# Patient Record
Sex: Female | Born: 1960
Health system: Southern US, Community
[De-identification: ages and names within clinical notes are randomized; demographics above are authoritative.]

## PROBLEM LIST (undated history)

## (undated) DIAGNOSIS — K5900 Constipation, unspecified: Secondary | ICD-10-CM

## (undated) DIAGNOSIS — R569 Unspecified convulsions: Secondary | ICD-10-CM

## (undated) DIAGNOSIS — D126 Benign neoplasm of colon, unspecified: Secondary | ICD-10-CM

## (undated) DIAGNOSIS — L989 Disorder of the skin and subcutaneous tissue, unspecified: Secondary | ICD-10-CM

## (undated) DIAGNOSIS — Z8619 Personal history of other infectious and parasitic diseases: Secondary | ICD-10-CM

## (undated) DIAGNOSIS — K219 Gastro-esophageal reflux disease without esophagitis: Secondary | ICD-10-CM

## (undated) DIAGNOSIS — F32A Depression, unspecified: Secondary | ICD-10-CM

## (undated) DIAGNOSIS — D649 Anemia, unspecified: Secondary | ICD-10-CM

## (undated) DIAGNOSIS — I10 Essential (primary) hypertension: Secondary | ICD-10-CM

## (undated) DIAGNOSIS — Z0000 Encounter for general adult medical examination without abnormal findings: Principal | ICD-10-CM

## (undated) DIAGNOSIS — F445 Conversion disorder with seizures or convulsions: Secondary | ICD-10-CM

## (undated) DIAGNOSIS — R739 Hyperglycemia, unspecified: Secondary | ICD-10-CM

## (undated) DIAGNOSIS — G43909 Migraine, unspecified, not intractable, without status migrainosus: Secondary | ICD-10-CM

## (undated) DIAGNOSIS — L309 Dermatitis, unspecified: Secondary | ICD-10-CM

## (undated) DIAGNOSIS — R319 Hematuria, unspecified: Secondary | ICD-10-CM

## (undated) DIAGNOSIS — F329 Major depressive disorder, single episode, unspecified: Secondary | ICD-10-CM

## (undated) DIAGNOSIS — E782 Mixed hyperlipidemia: Secondary | ICD-10-CM

## (undated) DIAGNOSIS — F419 Anxiety disorder, unspecified: Secondary | ICD-10-CM

## (undated) DIAGNOSIS — N921 Excessive and frequent menstruation with irregular cycle: Secondary | ICD-10-CM

## (undated) DIAGNOSIS — R1310 Dysphagia, unspecified: Secondary | ICD-10-CM

## (undated) HISTORY — DX: Anemia, unspecified: D64.9

## (undated) HISTORY — DX: Unspecified convulsions: R56.9

## (undated) HISTORY — DX: Constipation, unspecified: K59.00

## (undated) HISTORY — DX: Depression, unspecified: F32.A

## (undated) HISTORY — DX: Mixed hyperlipidemia: E78.2

## (undated) HISTORY — DX: Dysphagia, unspecified: R13.10

## (undated) HISTORY — DX: Excessive and frequent menstruation with irregular cycle: N92.1

## (undated) HISTORY — DX: Personal history of other infectious and parasitic diseases: Z86.19

## (undated) HISTORY — DX: Disorder of the skin and subcutaneous tissue, unspecified: L98.9

## (undated) HISTORY — DX: Hyperglycemia, unspecified: R73.9

## (undated) HISTORY — DX: Hematuria, unspecified: R31.9

## (undated) HISTORY — DX: Migraine, unspecified, not intractable, without status migrainosus: G43.909

## (undated) HISTORY — PX: ROTATOR CUFF REPAIR: SHX139

## (undated) HISTORY — DX: Encounter for general adult medical examination without abnormal findings: Z00.00

## (undated) HISTORY — DX: Major depressive disorder, single episode, unspecified: F32.9

## (undated) HISTORY — DX: Dermatitis, unspecified: L30.9

## (undated) HISTORY — PX: APPENDECTOMY: SHX54

## (undated) HISTORY — DX: Benign neoplasm of colon, unspecified: D12.6

---

## 1997-07-05 ENCOUNTER — Emergency Department (HOSPITAL_COMMUNITY): Admission: EM | Admit: 1997-07-05 | Discharge: 1997-07-05 | Payer: Self-pay | Admitting: Emergency Medicine

## 1997-09-22 ENCOUNTER — Emergency Department (HOSPITAL_COMMUNITY): Admission: EM | Admit: 1997-09-22 | Discharge: 1997-09-22 | Payer: Self-pay | Admitting: Emergency Medicine

## 1997-09-24 ENCOUNTER — Inpatient Hospital Stay: Admission: AD | Admit: 1997-09-24 | Discharge: 1997-09-25 | Payer: Self-pay | Admitting: Internal Medicine

## 1997-09-30 ENCOUNTER — Ambulatory Visit (HOSPITAL_COMMUNITY): Admission: RE | Admit: 1997-09-30 | Discharge: 1997-09-30 | Payer: Self-pay | Admitting: Internal Medicine

## 1997-10-05 ENCOUNTER — Inpatient Hospital Stay (HOSPITAL_COMMUNITY): Admission: AD | Admit: 1997-10-05 | Discharge: 1997-10-08 | Payer: Self-pay | Admitting: Internal Medicine

## 1997-10-20 ENCOUNTER — Encounter: Admission: RE | Admit: 1997-10-20 | Discharge: 1997-10-20 | Payer: Self-pay | Admitting: *Deleted

## 1998-01-06 ENCOUNTER — Encounter: Payer: Self-pay | Admitting: Emergency Medicine

## 1998-01-06 ENCOUNTER — Emergency Department (HOSPITAL_COMMUNITY): Admission: EM | Admit: 1998-01-06 | Discharge: 1998-01-06 | Payer: Self-pay | Admitting: Emergency Medicine

## 1998-01-21 ENCOUNTER — Ambulatory Visit (HOSPITAL_COMMUNITY): Admission: RE | Admit: 1998-01-21 | Discharge: 1998-01-21 | Payer: Self-pay | Admitting: Internal Medicine

## 1998-01-21 ENCOUNTER — Encounter: Payer: Self-pay | Admitting: Internal Medicine

## 1998-01-28 ENCOUNTER — Ambulatory Visit (HOSPITAL_COMMUNITY): Admission: RE | Admit: 1998-01-28 | Discharge: 1998-01-28 | Payer: Self-pay | Admitting: Internal Medicine

## 1998-01-28 ENCOUNTER — Encounter: Payer: Self-pay | Admitting: Internal Medicine

## 1998-03-07 ENCOUNTER — Emergency Department (HOSPITAL_COMMUNITY): Admission: EM | Admit: 1998-03-07 | Discharge: 1998-03-07 | Payer: Self-pay | Admitting: Emergency Medicine

## 1999-07-11 ENCOUNTER — Emergency Department (HOSPITAL_COMMUNITY): Admission: EM | Admit: 1999-07-11 | Discharge: 1999-07-11 | Payer: Self-pay | Admitting: Emergency Medicine

## 1999-10-14 ENCOUNTER — Emergency Department (HOSPITAL_COMMUNITY): Admission: EM | Admit: 1999-10-14 | Discharge: 1999-10-14 | Payer: Self-pay | Admitting: Emergency Medicine

## 1999-10-14 ENCOUNTER — Encounter: Payer: Self-pay | Admitting: Emergency Medicine

## 1999-12-25 ENCOUNTER — Emergency Department (HOSPITAL_COMMUNITY): Admission: EM | Admit: 1999-12-25 | Discharge: 1999-12-25 | Payer: Self-pay | Admitting: Emergency Medicine

## 1999-12-25 ENCOUNTER — Encounter: Payer: Self-pay | Admitting: Emergency Medicine

## 2001-01-12 ENCOUNTER — Encounter: Payer: Self-pay | Admitting: Orthopedic Surgery

## 2001-01-12 ENCOUNTER — Ambulatory Visit (HOSPITAL_COMMUNITY): Admission: RE | Admit: 2001-01-12 | Discharge: 2001-01-12 | Payer: Self-pay | Admitting: Orthopedic Surgery

## 2001-01-19 ENCOUNTER — Ambulatory Visit (HOSPITAL_COMMUNITY): Admission: RE | Admit: 2001-01-19 | Discharge: 2001-01-19 | Payer: Self-pay | Admitting: Orthopedic Surgery

## 2001-01-19 ENCOUNTER — Encounter: Payer: Self-pay | Admitting: Orthopedic Surgery

## 2001-02-03 ENCOUNTER — Other Ambulatory Visit: Admission: RE | Admit: 2001-02-03 | Discharge: 2001-02-03 | Payer: Self-pay | Admitting: Internal Medicine

## 2001-02-06 ENCOUNTER — Encounter: Admission: RE | Admit: 2001-02-06 | Discharge: 2001-02-06 | Payer: Self-pay | Admitting: Internal Medicine

## 2001-02-06 ENCOUNTER — Encounter: Payer: Self-pay | Admitting: Internal Medicine

## 2001-05-13 ENCOUNTER — Encounter: Payer: Self-pay | Admitting: Internal Medicine

## 2001-05-13 ENCOUNTER — Ambulatory Visit (HOSPITAL_COMMUNITY): Admission: RE | Admit: 2001-05-13 | Discharge: 2001-05-13 | Payer: Self-pay | Admitting: Internal Medicine

## 2001-05-14 ENCOUNTER — Encounter: Payer: Self-pay | Admitting: Internal Medicine

## 2001-05-14 ENCOUNTER — Inpatient Hospital Stay (HOSPITAL_COMMUNITY): Admission: AD | Admit: 2001-05-14 | Discharge: 2001-05-28 | Payer: Self-pay | Admitting: Internal Medicine

## 2001-05-14 ENCOUNTER — Ambulatory Visit (HOSPITAL_COMMUNITY): Admission: RE | Admit: 2001-05-14 | Discharge: 2001-05-14 | Payer: Self-pay | Admitting: Internal Medicine

## 2001-05-14 ENCOUNTER — Encounter (INDEPENDENT_AMBULATORY_CARE_PROVIDER_SITE_OTHER): Payer: Self-pay | Admitting: *Deleted

## 2001-05-20 ENCOUNTER — Encounter: Payer: Self-pay | Admitting: Internal Medicine

## 2001-05-22 ENCOUNTER — Encounter: Payer: Self-pay | Admitting: Internal Medicine

## 2001-05-23 ENCOUNTER — Encounter: Payer: Self-pay | Admitting: Internal Medicine

## 2001-05-27 ENCOUNTER — Encounter: Payer: Self-pay | Admitting: Internal Medicine

## 2001-06-04 ENCOUNTER — Observation Stay (HOSPITAL_COMMUNITY): Admission: EM | Admit: 2001-06-04 | Discharge: 2001-06-06 | Payer: Self-pay

## 2001-06-06 ENCOUNTER — Inpatient Hospital Stay (HOSPITAL_COMMUNITY): Admission: AD | Admit: 2001-06-06 | Discharge: 2001-06-09 | Payer: Self-pay | Admitting: Psychiatry

## 2001-06-12 ENCOUNTER — Encounter: Admission: RE | Admit: 2001-06-12 | Discharge: 2001-06-12 | Payer: Self-pay | Admitting: *Deleted

## 2001-07-02 ENCOUNTER — Encounter: Admission: RE | Admit: 2001-07-02 | Discharge: 2001-07-02 | Payer: Self-pay | Admitting: Psychiatry

## 2001-07-15 ENCOUNTER — Encounter: Admission: RE | Admit: 2001-07-15 | Discharge: 2001-07-15 | Payer: Self-pay | Admitting: *Deleted

## 2002-03-31 ENCOUNTER — Encounter: Payer: Self-pay | Admitting: Internal Medicine

## 2002-03-31 ENCOUNTER — Encounter: Admission: RE | Admit: 2002-03-31 | Discharge: 2002-03-31 | Payer: Self-pay | Admitting: Internal Medicine

## 2002-05-07 ENCOUNTER — Emergency Department (HOSPITAL_COMMUNITY): Admission: EM | Admit: 2002-05-07 | Discharge: 2002-05-08 | Payer: Self-pay | Admitting: Emergency Medicine

## 2002-08-03 ENCOUNTER — Emergency Department (HOSPITAL_COMMUNITY): Admission: EM | Admit: 2002-08-03 | Discharge: 2002-08-03 | Payer: Self-pay | Admitting: Emergency Medicine

## 2002-08-19 ENCOUNTER — Encounter: Admission: RE | Admit: 2002-08-19 | Discharge: 2002-08-19 | Payer: Self-pay

## 2003-11-15 ENCOUNTER — Ambulatory Visit (HOSPITAL_COMMUNITY): Admission: RE | Admit: 2003-11-15 | Discharge: 2003-11-15 | Payer: Self-pay | Admitting: Internal Medicine

## 2003-11-15 ENCOUNTER — Emergency Department (HOSPITAL_COMMUNITY): Admission: EM | Admit: 2003-11-15 | Discharge: 2003-11-15 | Payer: Self-pay | Admitting: Emergency Medicine

## 2003-11-26 ENCOUNTER — Encounter: Admission: RE | Admit: 2003-11-26 | Discharge: 2003-11-26 | Payer: Self-pay | Admitting: Internal Medicine

## 2003-12-06 ENCOUNTER — Inpatient Hospital Stay (HOSPITAL_COMMUNITY): Admission: EM | Admit: 2003-12-06 | Discharge: 2003-12-10 | Payer: Self-pay | Admitting: Emergency Medicine

## 2003-12-07 ENCOUNTER — Emergency Department (HOSPITAL_COMMUNITY): Admission: EM | Admit: 2003-12-07 | Discharge: 2003-12-07 | Payer: Self-pay | Admitting: Emergency Medicine

## 2004-01-06 ENCOUNTER — Ambulatory Visit (HOSPITAL_COMMUNITY): Admission: RE | Admit: 2004-01-06 | Discharge: 2004-01-06 | Payer: Self-pay | Admitting: Gastroenterology

## 2004-01-19 ENCOUNTER — Ambulatory Visit: Payer: Self-pay | Admitting: Gastroenterology

## 2004-01-25 ENCOUNTER — Ambulatory Visit: Payer: Self-pay | Admitting: Internal Medicine

## 2004-01-26 ENCOUNTER — Ambulatory Visit: Payer: Self-pay | Admitting: Internal Medicine

## 2004-01-26 ENCOUNTER — Inpatient Hospital Stay (HOSPITAL_COMMUNITY): Admission: AD | Admit: 2004-01-26 | Discharge: 2004-01-28 | Payer: Self-pay | Admitting: Internal Medicine

## 2004-01-31 ENCOUNTER — Other Ambulatory Visit: Admission: RE | Admit: 2004-01-31 | Discharge: 2004-01-31 | Payer: Self-pay | Admitting: Obstetrics and Gynecology

## 2004-03-19 DIAGNOSIS — N921 Excessive and frequent menstruation with irregular cycle: Secondary | ICD-10-CM

## 2004-03-19 HISTORY — DX: Excessive and frequent menstruation with irregular cycle: N92.1

## 2004-03-31 ENCOUNTER — Ambulatory Visit: Payer: Self-pay | Admitting: Internal Medicine

## 2004-04-04 ENCOUNTER — Inpatient Hospital Stay (HOSPITAL_COMMUNITY): Admission: AD | Admit: 2004-04-04 | Discharge: 2004-04-04 | Payer: Self-pay | Admitting: Obstetrics and Gynecology

## 2004-04-06 ENCOUNTER — Encounter (INDEPENDENT_AMBULATORY_CARE_PROVIDER_SITE_OTHER): Payer: Self-pay | Admitting: Specialist

## 2004-04-06 ENCOUNTER — Inpatient Hospital Stay (HOSPITAL_COMMUNITY): Admission: RE | Admit: 2004-04-06 | Discharge: 2004-04-08 | Payer: Self-pay | Admitting: Obstetrics and Gynecology

## 2004-04-06 HISTORY — PX: BILATERAL SALPINGOOPHORECTOMY: SHX1223

## 2004-04-06 HISTORY — PX: TOTAL LAPAROSCOPIC HYSTERECTOMY WITH BILATERAL SALPINGO OOPHORECTOMY: SHX6845

## 2004-04-06 HISTORY — PX: ABDOMINAL HYSTERECTOMY: SHX81

## 2004-05-02 ENCOUNTER — Emergency Department (HOSPITAL_COMMUNITY): Admission: EM | Admit: 2004-05-02 | Discharge: 2004-05-02 | Payer: Self-pay | Admitting: Emergency Medicine

## 2004-07-05 ENCOUNTER — Ambulatory Visit: Payer: Self-pay | Admitting: Gastroenterology

## 2004-08-23 ENCOUNTER — Ambulatory Visit: Payer: Self-pay | Admitting: Internal Medicine

## 2005-01-17 ENCOUNTER — Ambulatory Visit: Payer: Self-pay | Admitting: Internal Medicine

## 2005-02-20 ENCOUNTER — Ambulatory Visit (HOSPITAL_COMMUNITY): Admission: RE | Admit: 2005-02-20 | Discharge: 2005-02-20 | Payer: Self-pay | Admitting: Urology

## 2005-02-20 ENCOUNTER — Ambulatory Visit (HOSPITAL_BASED_OUTPATIENT_CLINIC_OR_DEPARTMENT_OTHER): Admission: RE | Admit: 2005-02-20 | Discharge: 2005-02-20 | Payer: Self-pay | Admitting: Urology

## 2005-11-22 ENCOUNTER — Ambulatory Visit: Payer: Self-pay | Admitting: Gastroenterology

## 2005-11-26 ENCOUNTER — Encounter: Payer: Self-pay | Admitting: Gastroenterology

## 2005-11-26 ENCOUNTER — Ambulatory Visit: Payer: Self-pay | Admitting: Gastroenterology

## 2005-11-26 LAB — HM COLONOSCOPY

## 2005-12-24 ENCOUNTER — Emergency Department (HOSPITAL_COMMUNITY): Admission: EM | Admit: 2005-12-24 | Discharge: 2005-12-24 | Payer: Self-pay | Admitting: Emergency Medicine

## 2005-12-27 ENCOUNTER — Ambulatory Visit (HOSPITAL_COMMUNITY): Admission: RE | Admit: 2005-12-27 | Discharge: 2005-12-27 | Payer: Self-pay | Admitting: Internal Medicine

## 2005-12-27 ENCOUNTER — Ambulatory Visit: Payer: Self-pay | Admitting: Internal Medicine

## 2005-12-31 ENCOUNTER — Ambulatory Visit: Payer: Self-pay | Admitting: Internal Medicine

## 2005-12-31 ENCOUNTER — Emergency Department (HOSPITAL_COMMUNITY): Admission: EM | Admit: 2005-12-31 | Discharge: 2005-12-31 | Payer: Self-pay | Admitting: Emergency Medicine

## 2006-02-05 ENCOUNTER — Ambulatory Visit (HOSPITAL_COMMUNITY): Admission: RE | Admit: 2006-02-05 | Discharge: 2006-02-05 | Payer: Self-pay | Admitting: Neurology

## 2006-02-19 ENCOUNTER — Encounter: Admission: RE | Admit: 2006-02-19 | Discharge: 2006-03-28 | Payer: Self-pay | Admitting: Neurology

## 2006-06-10 ENCOUNTER — Encounter: Admission: RE | Admit: 2006-06-10 | Discharge: 2006-06-27 | Payer: Self-pay | Admitting: Neurology

## 2006-06-23 ENCOUNTER — Emergency Department (HOSPITAL_COMMUNITY): Admission: EM | Admit: 2006-06-23 | Discharge: 2006-06-23 | Payer: Self-pay | Admitting: Emergency Medicine

## 2006-11-14 ENCOUNTER — Ambulatory Visit: Payer: Self-pay

## 2006-11-14 ENCOUNTER — Ambulatory Visit: Payer: Self-pay | Admitting: Internal Medicine

## 2006-11-14 LAB — CONVERTED CEMR LAB
BUN: 7 mg/dL (ref 6–23)
CO2: 33 meq/L — ABNORMAL HIGH (ref 19–32)
Calcium: 9.9 mg/dL (ref 8.4–10.5)
Chloride: 104 meq/L (ref 96–112)
Creatinine, Ser: 0.6 mg/dL (ref 0.4–1.2)
GFR calc Af Amer: 137 mL/min
GFR calc non Af Amer: 113 mL/min
Glucose, Bld: 118 mg/dL — ABNORMAL HIGH (ref 70–99)
Potassium: 4.4 meq/L (ref 3.5–5.1)
Sodium: 142 meq/L (ref 135–145)
Uric Acid, Serum: 3.7 mg/dL (ref 2.4–7.0)

## 2007-03-10 DIAGNOSIS — R55 Syncope and collapse: Secondary | ICD-10-CM | POA: Insufficient documentation

## 2007-03-10 DIAGNOSIS — D649 Anemia, unspecified: Secondary | ICD-10-CM | POA: Insufficient documentation

## 2007-03-10 DIAGNOSIS — M129 Arthropathy, unspecified: Secondary | ICD-10-CM | POA: Insufficient documentation

## 2007-03-10 DIAGNOSIS — G43909 Migraine, unspecified, not intractable, without status migrainosus: Secondary | ICD-10-CM | POA: Insufficient documentation

## 2007-03-11 ENCOUNTER — Encounter: Payer: Self-pay | Admitting: Internal Medicine

## 2007-04-28 ENCOUNTER — Ambulatory Visit: Payer: Self-pay | Admitting: Internal Medicine

## 2007-04-28 DIAGNOSIS — R634 Abnormal weight loss: Secondary | ICD-10-CM | POA: Insufficient documentation

## 2007-05-29 ENCOUNTER — Ambulatory Visit: Payer: Self-pay | Admitting: Internal Medicine

## 2007-05-29 DIAGNOSIS — R109 Unspecified abdominal pain: Secondary | ICD-10-CM | POA: Insufficient documentation

## 2007-05-29 DIAGNOSIS — K5289 Other specified noninfective gastroenteritis and colitis: Secondary | ICD-10-CM | POA: Insufficient documentation

## 2007-06-09 LAB — CONVERTED CEMR LAB
ALT: 20 units/L (ref 0–35)
AST: 23 units/L (ref 0–37)
Albumin: 4.3 g/dL (ref 3.5–5.2)
Alkaline Phosphatase: 58 units/L (ref 39–117)
BUN: 9 mg/dL (ref 6–23)
Basophils Absolute: 0.1 10*3/uL (ref 0.0–0.1)
Basophils Relative: 0.8 % (ref 0.0–1.0)
Bilirubin Urine: NEGATIVE
Bilirubin, Direct: 0.2 mg/dL (ref 0.0–0.3)
CO2: 34 meq/L — ABNORMAL HIGH (ref 19–32)
Calcium: 10.1 mg/dL (ref 8.4–10.5)
Chloride: 104 meq/L (ref 96–112)
Creatinine, Ser: 0.6 mg/dL (ref 0.4–1.2)
Crystals: NEGATIVE
Eosinophils Absolute: 0.1 10*3/uL (ref 0.0–0.6)
Eosinophils Relative: 1.4 % (ref 0.0–5.0)
Free T4: 0.9 ng/dL (ref 0.6–1.6)
GFR calc Af Amer: 137 mL/min
GFR calc non Af Amer: 113 mL/min
Glucose, Bld: 90 mg/dL (ref 70–99)
HCT: 36.9 % (ref 36.0–46.0)
Hemoglobin: 11.9 g/dL — ABNORMAL LOW (ref 12.0–15.0)
Leukocytes, UA: NEGATIVE
Lipase: 25 units/L (ref 11.0–59.0)
Lymphocytes Relative: 34.7 % (ref 12.0–46.0)
MCHC: 32.3 g/dL (ref 30.0–36.0)
MCV: 85.1 fL (ref 78.0–100.0)
Monocytes Absolute: 0.5 10*3/uL (ref 0.2–0.7)
Monocytes Relative: 4.7 % (ref 3.0–11.0)
Mucus, UA: NEGATIVE
Neutro Abs: 5.6 10*3/uL (ref 1.4–7.7)
Neutrophils Relative %: 58.4 % (ref 43.0–77.0)
Nitrite: NEGATIVE
Platelets: 280 10*3/uL (ref 150–400)
Potassium: 4.3 meq/L (ref 3.5–5.1)
RBC: 4.33 M/uL (ref 3.87–5.11)
RDW: 13.7 % (ref 11.5–14.6)
Sodium: 145 meq/L (ref 135–145)
Specific Gravity, Urine: 1.015 (ref 1.000–1.03)
TSH: 1.32 microintl units/mL (ref 0.35–5.50)
Total Bilirubin: 1.1 mg/dL (ref 0.3–1.2)
Total Protein, Urine: NEGATIVE mg/dL
Total Protein: 7.2 g/dL (ref 6.0–8.3)
Urine Glucose: NEGATIVE mg/dL
Urobilinogen, UA: 1 (ref 0.0–1.0)
WBC: 9.7 10*3/uL (ref 4.5–10.5)
pH: 7.5 (ref 5.0–8.0)

## 2007-06-13 ENCOUNTER — Ambulatory Visit: Payer: Self-pay | Admitting: Cardiology

## 2007-06-13 ENCOUNTER — Ambulatory Visit: Payer: Self-pay | Admitting: Internal Medicine

## 2007-06-24 ENCOUNTER — Ambulatory Visit: Payer: Self-pay | Admitting: Internal Medicine

## 2007-06-24 DIAGNOSIS — R3129 Other microscopic hematuria: Secondary | ICD-10-CM | POA: Insufficient documentation

## 2007-06-24 LAB — CONVERTED CEMR LAB
Bilirubin Urine: NEGATIVE
Crystals: NEGATIVE
Ketones, ur: NEGATIVE mg/dL
Leukocytes, UA: NEGATIVE
Mucus, UA: NEGATIVE
Nitrite: NEGATIVE
Specific Gravity, Urine: >=1.03 (ref 1.000–1.03)
Total Protein, Urine: NEGATIVE mg/dL
Urine Glucose: NEGATIVE mg/dL
Urobilinogen, UA: 0.2 (ref 0.0–1.0)
pH: 6 (ref 5.0–8.0)

## 2007-08-22 ENCOUNTER — Encounter: Payer: Self-pay | Admitting: Internal Medicine

## 2007-12-24 ENCOUNTER — Ambulatory Visit: Payer: Self-pay | Admitting: Internal Medicine

## 2007-12-24 DIAGNOSIS — J069 Acute upper respiratory infection, unspecified: Secondary | ICD-10-CM | POA: Insufficient documentation

## 2007-12-24 LAB — CONVERTED CEMR LAB
Inflenza A Ag: NEGATIVE
Influenza B Ag: NEGATIVE
Rapid Strep: NEGATIVE

## 2007-12-25 ENCOUNTER — Emergency Department (HOSPITAL_BASED_OUTPATIENT_CLINIC_OR_DEPARTMENT_OTHER): Admission: EM | Admit: 2007-12-25 | Discharge: 2007-12-25 | Payer: Self-pay | Admitting: Emergency Medicine

## 2008-01-03 ENCOUNTER — Emergency Department (HOSPITAL_COMMUNITY): Admission: EM | Admit: 2008-01-03 | Discharge: 2008-01-03 | Payer: Self-pay | Admitting: Emergency Medicine

## 2008-01-08 ENCOUNTER — Inpatient Hospital Stay (HOSPITAL_COMMUNITY): Admission: EM | Admit: 2008-01-08 | Discharge: 2008-01-09 | Payer: Self-pay | Admitting: Emergency Medicine

## 2008-01-08 ENCOUNTER — Ambulatory Visit: Payer: Self-pay | Admitting: Internal Medicine

## 2008-01-13 ENCOUNTER — Ambulatory Visit: Payer: Self-pay | Admitting: Internal Medicine

## 2008-01-13 DIAGNOSIS — R0609 Other forms of dyspnea: Secondary | ICD-10-CM

## 2008-01-13 DIAGNOSIS — R0601 Orthopnea: Secondary | ICD-10-CM | POA: Insufficient documentation

## 2008-01-13 DIAGNOSIS — B9789 Other viral agents as the cause of diseases classified elsewhere: Secondary | ICD-10-CM | POA: Insufficient documentation

## 2008-01-13 DIAGNOSIS — R0600 Dyspnea, unspecified: Secondary | ICD-10-CM | POA: Insufficient documentation

## 2008-01-13 LAB — CONVERTED CEMR LAB
BUN: 12 mg/dL (ref 6–23)
CO2: 30 meq/L (ref 19–32)
Calcium: 10 mg/dL (ref 8.4–10.5)
Chloride: 101 meq/L (ref 96–112)
Creatinine, Ser: 0.5 mg/dL (ref 0.4–1.2)
GFR calc Af Amer: 169 mL/min
GFR calc non Af Amer: 139 mL/min
Glucose, Bld: 78 mg/dL (ref 70–99)
Potassium: 4.5 meq/L (ref 3.5–5.1)
Pro B Natriuretic peptide (BNP): 32 pg/mL (ref 0.0–100.0)
Sodium: 140 meq/L (ref 135–145)

## 2008-01-14 ENCOUNTER — Encounter: Payer: Self-pay | Admitting: Internal Medicine

## 2008-01-14 ENCOUNTER — Ambulatory Visit: Payer: Self-pay

## 2008-01-19 ENCOUNTER — Telehealth: Payer: Self-pay | Admitting: Internal Medicine

## 2008-01-20 ENCOUNTER — Ambulatory Visit: Payer: Self-pay | Admitting: Internal Medicine

## 2008-02-05 ENCOUNTER — Ambulatory Visit: Payer: Self-pay | Admitting: Internal Medicine

## 2008-05-17 ENCOUNTER — Ambulatory Visit: Payer: Self-pay | Admitting: Internal Medicine

## 2008-05-17 DIAGNOSIS — J018 Other acute sinusitis: Secondary | ICD-10-CM | POA: Insufficient documentation

## 2008-05-17 DIAGNOSIS — IMO0002 Reserved for concepts with insufficient information to code with codable children: Secondary | ICD-10-CM | POA: Insufficient documentation

## 2008-05-27 ENCOUNTER — Ambulatory Visit: Payer: Self-pay | Admitting: Internal Medicine

## 2008-05-31 ENCOUNTER — Encounter: Payer: Self-pay | Admitting: Internal Medicine

## 2008-05-31 ENCOUNTER — Encounter: Admission: RE | Admit: 2008-05-31 | Discharge: 2008-05-31 | Payer: Self-pay | Admitting: Internal Medicine

## 2008-06-03 ENCOUNTER — Telehealth: Payer: Self-pay | Admitting: Internal Medicine

## 2008-06-04 ENCOUNTER — Encounter: Payer: Self-pay | Admitting: Internal Medicine

## 2008-09-27 ENCOUNTER — Telehealth: Payer: Self-pay | Admitting: Family Medicine

## 2008-09-27 ENCOUNTER — Ambulatory Visit: Payer: Self-pay | Admitting: Family Medicine

## 2008-09-27 ENCOUNTER — Telehealth (INDEPENDENT_AMBULATORY_CARE_PROVIDER_SITE_OTHER): Payer: Self-pay | Admitting: *Deleted

## 2008-09-27 DIAGNOSIS — R21 Rash and other nonspecific skin eruption: Secondary | ICD-10-CM | POA: Insufficient documentation

## 2008-09-27 DIAGNOSIS — M5442 Lumbago with sciatica, left side: Secondary | ICD-10-CM | POA: Insufficient documentation

## 2008-10-11 ENCOUNTER — Ambulatory Visit: Payer: Self-pay | Admitting: Radiology

## 2008-10-11 ENCOUNTER — Ambulatory Visit: Payer: Self-pay | Admitting: Internal Medicine

## 2008-10-11 ENCOUNTER — Emergency Department (HOSPITAL_BASED_OUTPATIENT_CLINIC_OR_DEPARTMENT_OTHER): Admission: EM | Admit: 2008-10-11 | Discharge: 2008-10-11 | Payer: Self-pay | Admitting: Emergency Medicine

## 2008-10-13 ENCOUNTER — Encounter: Payer: Self-pay | Admitting: Internal Medicine

## 2008-10-27 ENCOUNTER — Ambulatory Visit (HOSPITAL_COMMUNITY): Admission: RE | Admit: 2008-10-27 | Discharge: 2008-10-27 | Payer: Self-pay | Admitting: Orthopedic Surgery

## 2008-12-01 ENCOUNTER — Encounter: Admission: RE | Admit: 2008-12-01 | Discharge: 2009-02-07 | Payer: Self-pay | Admitting: Orthopedic Surgery

## 2008-12-15 ENCOUNTER — Emergency Department (HOSPITAL_BASED_OUTPATIENT_CLINIC_OR_DEPARTMENT_OTHER): Admission: EM | Admit: 2008-12-15 | Discharge: 2008-12-15 | Payer: Self-pay | Admitting: Emergency Medicine

## 2009-02-28 ENCOUNTER — Ambulatory Visit: Payer: Self-pay | Admitting: Internal Medicine

## 2009-03-02 ENCOUNTER — Encounter: Payer: Self-pay | Admitting: Internal Medicine

## 2009-03-25 ENCOUNTER — Emergency Department (HOSPITAL_COMMUNITY): Admission: EM | Admit: 2009-03-25 | Discharge: 2009-03-25 | Payer: Self-pay | Admitting: Emergency Medicine

## 2009-04-08 ENCOUNTER — Encounter: Payer: Self-pay | Admitting: Internal Medicine

## 2009-06-02 ENCOUNTER — Ambulatory Visit: Payer: Self-pay | Admitting: Internal Medicine

## 2009-06-02 DIAGNOSIS — B353 Tinea pedis: Secondary | ICD-10-CM | POA: Insufficient documentation

## 2009-07-12 ENCOUNTER — Telehealth: Payer: Self-pay | Admitting: Internal Medicine

## 2009-07-27 ENCOUNTER — Ambulatory Visit: Payer: Self-pay | Admitting: Diagnostic Radiology

## 2009-07-27 ENCOUNTER — Ambulatory Visit (HOSPITAL_BASED_OUTPATIENT_CLINIC_OR_DEPARTMENT_OTHER): Admission: RE | Admit: 2009-07-27 | Discharge: 2009-07-27 | Payer: Self-pay | Admitting: Internal Medicine

## 2009-07-27 ENCOUNTER — Ambulatory Visit: Payer: Self-pay | Admitting: Internal Medicine

## 2009-07-27 ENCOUNTER — Telehealth: Payer: Self-pay | Admitting: Internal Medicine

## 2009-07-29 ENCOUNTER — Telehealth: Payer: Self-pay | Admitting: Internal Medicine

## 2009-08-11 ENCOUNTER — Ambulatory Visit: Payer: Self-pay | Admitting: Diagnostic Radiology

## 2009-08-11 ENCOUNTER — Ambulatory Visit (HOSPITAL_BASED_OUTPATIENT_CLINIC_OR_DEPARTMENT_OTHER): Admission: RE | Admit: 2009-08-11 | Discharge: 2009-08-11 | Payer: Self-pay | Admitting: Internal Medicine

## 2009-08-11 LAB — HM MAMMOGRAPHY: HM Mammogram: NEGATIVE

## 2009-12-16 ENCOUNTER — Encounter: Payer: Self-pay | Admitting: Internal Medicine

## 2009-12-21 ENCOUNTER — Encounter: Payer: Self-pay | Admitting: Internal Medicine

## 2009-12-21 ENCOUNTER — Ambulatory Visit: Payer: Self-pay | Admitting: Internal Medicine

## 2009-12-21 ENCOUNTER — Emergency Department (HOSPITAL_BASED_OUTPATIENT_CLINIC_OR_DEPARTMENT_OTHER): Admission: EM | Admit: 2009-12-21 | Discharge: 2009-12-21 | Payer: Self-pay | Admitting: Emergency Medicine

## 2009-12-21 ENCOUNTER — Ambulatory Visit: Payer: Self-pay | Admitting: Diagnostic Radiology

## 2009-12-21 ENCOUNTER — Telehealth: Payer: Self-pay | Admitting: Family

## 2009-12-21 DIAGNOSIS — R079 Chest pain, unspecified: Secondary | ICD-10-CM | POA: Insufficient documentation

## 2009-12-23 ENCOUNTER — Encounter: Payer: Self-pay | Admitting: Internal Medicine

## 2009-12-29 ENCOUNTER — Telehealth: Payer: Self-pay | Admitting: Internal Medicine

## 2009-12-29 ENCOUNTER — Ambulatory Visit (HOSPITAL_BASED_OUTPATIENT_CLINIC_OR_DEPARTMENT_OTHER): Admission: RE | Admit: 2009-12-29 | Discharge: 2009-12-29 | Payer: Self-pay | Admitting: Internal Medicine

## 2009-12-29 ENCOUNTER — Ambulatory Visit: Payer: Self-pay | Admitting: Diagnostic Radiology

## 2009-12-29 ENCOUNTER — Ambulatory Visit: Payer: Self-pay | Admitting: Internal Medicine

## 2009-12-29 DIAGNOSIS — F419 Anxiety disorder, unspecified: Secondary | ICD-10-CM | POA: Insufficient documentation

## 2009-12-29 DIAGNOSIS — R091 Pleurisy: Secondary | ICD-10-CM | POA: Insufficient documentation

## 2009-12-29 DIAGNOSIS — F329 Major depressive disorder, single episode, unspecified: Secondary | ICD-10-CM

## 2009-12-29 DIAGNOSIS — F32A Depression, unspecified: Secondary | ICD-10-CM | POA: Insufficient documentation

## 2010-03-29 ENCOUNTER — Ambulatory Visit: Admit: 2010-03-29 | Payer: Self-pay | Admitting: Internal Medicine

## 2010-03-29 DIAGNOSIS — Z0289 Encounter for other administrative examinations: Secondary | ICD-10-CM

## 2010-04-03 ENCOUNTER — Ambulatory Visit
Admission: RE | Admit: 2010-04-03 | Discharge: 2010-04-03 | Payer: Self-pay | Source: Home / Self Care | Attending: Internal Medicine | Admitting: Internal Medicine

## 2010-04-05 ENCOUNTER — Ambulatory Visit
Admission: RE | Admit: 2010-04-05 | Discharge: 2010-04-05 | Payer: Self-pay | Source: Home / Self Care | Attending: Internal Medicine | Admitting: Internal Medicine

## 2010-04-09 ENCOUNTER — Encounter: Payer: Self-pay | Admitting: Chiropractor

## 2010-04-09 ENCOUNTER — Encounter: Payer: Self-pay | Admitting: Internal Medicine

## 2010-04-18 NOTE — Assessment & Plan Note (Signed)
Summary: hurts all over congestion/mhf--Rm 4   Vital Signs:  Patient profile:   50 year old female O2 Sat:      100 % on Room air Temp:     98.1 degrees F oral Pulse rate:   80 / minute Pulse rhythm:   regular Resp:     18 per minute BP sitting:   160 / 110  (right arm) Cuff size:   regular  Vitals Entered By: Mervin Kung CMA (AAMA) (December 21, 2009 10:28 AM)  O2 Flow:  Room air CC: Rm 4  Congested, can't take a deep breath, chest hurts to move. Is Patient Diabetic? No Pain Assessment Patient in pain? yes      Comments Recently seen at [facility]. Will call to get records.  Pt has completed Doxycycline.  Mervin Kung CMA Duncan Dull)  December 21, 2009 10:36 AM    Primary Care Provider:  Dondra Spry DO  CC:  Rm 4  Congested, can't take a deep breath, and chest hurts to move.Marland Kitchen  History of Present Illness: 50 y/o AA female c/o chest pain with inhalation she was seen at [facility] ER on 9/30.  she presented with cough and chest tightness. she has assoc diarrhea which resolved pt was prescribed and took azithromycin and cough medicaton  pt now having severe anterior chest pain  her symptoms worse with inhalation     Allergies (verified): No Known Drug Allergies  Past History:  Past Medical History: History of pseudoseizure disorder Migraine Headache Hx of Anemia    History of Uterine leiomyomata  2006  History of Menometrorrhagia  2006        Family History: Family History Diabetes 1st degree relative Family History Hypertension         Social History:  Married Former Smoker   Alcohol use-no          Review of Systems       The patient complains of chest pain.  The patient denies fever and abdominal pain.    Physical Exam  General:  50 y/o thin AA female in distress Head:  normocephalic and atraumatic.   Ears:  R ear normal and L ear normal.   Mouth:  pharynx pink and moist.   Lungs:  increased resp effort, short shallow breaths,  no crackles and no  wheezes.   Heart:  normal rate, regular rhythm, no murmur, no gallop, and no rub.   Abdomen:  soft, non-tender, normal bowel sounds, and no masses.   Extremities:  No lower extremity edema no calf tenderness Neurologic:  cranial nerves II-XII intact and gait normal.     Impression & Recommendations:  Problem # 1:  CHEST PAIN, ACUTE (ICD-786.50) 50 y/o AA female with acute anterior chest pains.  syptoms concerning for pleurisy. refer to ER for evaluation rule of PTX rule out PE rule out cardiac ischemia  spoke with ER charge nurse re:  pain if above work up normal,  I suggest NSAIDs and/or prednisone taper  Complete Medication List: 1)  Omeprazole 20 Mg Cpdr (Omeprazole) .... One by mouth two times a day before meals 2)  Flexeril 10 Mg Tabs (Cyclobenzaprine hcl) .Marland Kitchen.. 1 tab by mouth at bedtime as needed back pain 3)  Gabapentin 300 Mg Caps (Gabapentin) .... Take 1 tab by mouth at bedtime 4)  Clotrimazole-betamethasone 1-0.05 % Crea (Clotrimazole-betamethasone) .... Apply two times a day x 2 weeks 5)  Citalopram Hydrobromide 40 Mg Tabs (Citalopram hydrobromide) .... One by mouth  once daily 6)  Hydroxyzine Hcl 25 Mg Tabs (Hydroxyzine hcl) .... Take 1 tablet by mouth four times a day 7)  Nucynta 75 Mg Tabs (Tapentadol hcl) .... Take 1 tablet by mouth bid  as needed pain 8)  Azithromycin 250 Mg Tabs (Azithromycin) .... As directed. 9)  Hydrocodone-homatropine 5-1.5 Mg Tabs (Hydrocodone-homatropine) .... 5ml by mouth every 4 - 6 hours as needed. 10)  Albuterol  .Marland Kitchen.. 1 - 2 inhalations every 4 -6 hours as needed.  Patient Instructions: 1)  Please go to [facility] ER for further evaluation 2)    Cardiac enzymes 3)    CT of Chest PE protocol  Current Allergies (reviewed today): No known allergies

## 2010-04-18 NOTE — Letter (Signed)
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 [address]   [place], [place] [address]   Phone: [phone]  Fax: [phone]    September 27, 2008   Employee:  LASHAUNDRA LEHRMANN    To Whom It May Concern:   For Medical reasons, please excuse the above named employee from work for the following dates:  Start:   July 12  End:   July 13  If you need additional information, please feel free to contact our office.         Sincerely,    Seymour Bars DO

## 2010-04-18 NOTE — Progress Notes (Signed)
  Phone Note Other Incoming   Caller: Dr. Hipolito Bayley ED physician Summary of Call: Dr. Rosalia Hammers called to say that Ms. Bou has presented with atypical chest pain.  Labs normal, EKG without ischemic changes, pt has responded well to pain medication.  She plans to discharge patient home and wanted to let Dr. Artist Pais know.   Initial call taken by: Lemont Fillers FNP,  December 21, 2009 1:51 PM

## 2010-04-18 NOTE — Letter (Signed)
Summary: Work Dietitian at [employer]. [address]   [place], [place] [address]   Phone: [phone]  Fax: [phone]       Today's Date: February 05, 2008      Name of Patient: Ruth Bell    The above named patient had a medical visit today 02/05/08.  Please take this into consideration when reviewing the time away from work.    Special Instructions:  [  ] None  [  ] To be off the remainder of today, returning to the normal work / school schedule tomorrow.  [  ] To be off until the next scheduled appointment on ______________________.  [ X ] Other Patient ins cleared to return to work with no restrictions  02/09/08    Sincerely yours,   Glendell Docker CMA Dr Thomos Lemons

## 2010-04-18 NOTE — Assessment & Plan Note (Signed)
Summary: f/u/hea, resched- jr [facility] WITH PT/MHF   Vital Signs:  Patient profile:   50 year old female Weight:      100.50 pounds BMI:     18.45 Temp:     98.0 degrees F oral Pulse rate:   92 / minute Pulse rhythm:   regular Resp:     16 per minute BP sitting:   98 / 70  (right arm) Cuff size:   regular  Vitals Entered By: Mervin Kung CMA (June 02, 2009 2:16 PM) CC: room 3  Follow up Is Patient Diabetic? No   Primary Care Provider:  Dondra Spry DO  CC:  room 3  Follow up.  History of Present Illness:  50 y/o AA female for follow up. since prev visit pt seen by neurologist.  she has hx of pseudo seizures. pt started on SSRI.  stress level is much better overall doing well  she c/o discoloration of toe nail no numbness or tingling she is  concerned she may have diabetes no polyuria or polydipsia  Preventive Screening-Counseling & Management  Alcohol-Tobacco     Smoking Status: current  Allergies (verified): No Known Drug Allergies  Past History:  Past Medical History: History of pseudoseizure disorder Migraine Headache Hx of Anemia   History of Uterine leiomyomata  2006 History of Menometrorrhagia  2006       Past Surgical History: Appendectomy  Hysterectomy and BSO  04/06/2004  Rotator cuff repair  Colonoscopy 11/26/2005 (Internal hemorrhoid)       Family History: Family History Diabetes 1st degree relative Family History Hypertension      Social History:  Married Former Smoker  Alcohol use-no      Smoking Status:  current  Physical Exam  General:  thin, pleasant 50 y/o AA female Lungs:  normal respiratory effort and normal breath sounds.   Heart:  normal rate, regular rhythm, and no gallop.   Abdomen:  soft, non-tender, and normal bowel sounds.   Extremities:  no LE edema Neurologic:  normal sensation to temp and vibration of lower ext Skin:  toe nails slightly thickened, dry, scaly skin of both feet   Impression &  Recommendations:  Problem # 1:  TINEA PEDIS (ICD-110.4) Use cream as directed. Patient advised to call office if symptoms persist or worsen.  Her updated medication list for this problem includes:    Clotrimazole-betamethasone 1-0.05 % Crea (Clotrimazole-betamethasone) .Marland Kitchen... Apply two times a day x 2 weeks  Problem # 2:  SEIZURE DISORDER (PSEUDO) (ICD-780.39) significantly improved with SSRI.   followed by Dr Hyacinth Meeker at [facility] neuro clinic.   gabapentin for migraine headaches  Her updated medication list for this problem includes:    Gabapentin 300 Mg Caps (Gabapentin) ..... One by mouth qhs  Complete Medication List: 1)  Hyoscyamine Sulfate 0.125 Mg Subl (Hyoscyamine sulfate) .... 2 tabets sublinguinal every 4 hours as needed 2)  Proair Hfa 108 (90 Base) Mcg/act Aers (Albuterol sulfate) .... 2 puffs by mouth two times a day as needed 3)  Amitriptyline Hcl 10 Mg Tabs (Amitriptyline hcl) .... One tablet by mouth at bedtime 4)  Omeprazole 20 Mg Cpdr (Omeprazole) .... One by mouth two times a day before meals 5)  Flexeril 10 Mg Tabs (Cyclobenzaprine hcl) .Marland Kitchen.. 1 tab by mouth at bedtime as needed back pain 6)  Oxycodone-acetaminophen 5-325 Mg Tabs (Oxycodone-acetaminophen) .Marland Kitchen.. 1 tab by mouth three times a day as needed severe pain 7)  Gabapentin 300 Mg Caps (Gabapentin) .... One by mouth  qhs 8)  Clotrimazole-betamethasone 1-0.05 % Crea (Clotrimazole-betamethasone) .... Apply two times a day x 2 weeks 9)  Citalopram Hydrobromide 40 Mg Tabs (Citalopram hydrobromide) .... One by mouth once daily  Patient Instructions: 1)  Please schedule a follow-up appointment in 1 year. 2)  BMP prior to visit, ICD-9:  v70 3)  Hepatic Panel prior to visit, ICD-9:  v70 4)  Lipid Panel prior to visit, ICD-9: v70 5)  TSH prior to visit, ICD-9: v70 6)  CBC w/ Diff prior to visit, ICD-9: v70 7)  Please return for lab work one (1) week before your next appointment.  Prescriptions: CLOTRIMAZOLE-BETAMETHASONE  1-0.05 % CREA (CLOTRIMAZOLE-BETAMETHASONE) apply two times a day x 2 weeks  #15 gms x 1   Entered and Authorized by:   D. Thomos Lemons DO   Signed by:   D. Thomos Lemons DO on 06/02/2009   Method used:   Electronically to        [id number] (retail)       [address] [place], [place]  [address]       Ph: [phone]       Fax: [phone]   RxID:   [id number]   Current Allergies (reviewed today): No known allergies

## 2010-04-18 NOTE — Letter (Signed)
Summary: [place] No Show Letter  [place] [facility] [place]  [address] [place], [place] [address]   Phone: [phone]  Fax: [phone]    08/22/2007 MRN: [id number]  Ruth Bell [address] [place], [place]  [address]   Dear Ms. Fawcett,   Our records indicate that you missed your scheduled appointment with Dr. Thomos Lemons on 08/22/07. Please contact this office to reschedule your appointment as soon as possible.  It is important that you keep your scheduled appointments with your physician, so we can provide you the best care possible.  Please be advised that there may be a charge for "no show" appointments.    Sincerely, [place] at [facility]  Appended Document: Orders Update     Clinical Lists Changes  Orders: Added new Service order of No Show NS50 (NS50) - Signed

## 2010-04-18 NOTE — Letter (Signed)
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 [address]   [place], [place] [address]   Phone: [phone]  Fax: [phone]      October 11, 2008      Employee:  Ruth Bell      To Whom It May Concern:   For Medical reasons, please excuse the above named employee from work for the following dates:    Start:   10/11/2008  End:   10/25/2008  If you need additional information, please feel free to contact our office.          Sincerely,    Glendell Docker CMA Dr Thomos Lemons

## 2010-04-18 NOTE — Assessment & Plan Note (Signed)
Summary: t b test/mhf  Nurse Visit   Allergies: No Known Drug Allergies  Immunizations Administered:  PPD Skin Test:    Vaccine Type: PPD    Site: left forearm    Mfr: Sanofi Pasteur    Dose: 0.1 ml    Route: ID    Given by: Darlene Knight CMA    Exp. Date: 05/25/2011    Lot #: [id number]  Orders Added: 1)  TB Skin Test [86580] 2)  Admin 1st Vaccine [90471] 

## 2010-04-18 NOTE — Progress Notes (Signed)
Summary: Pain Medication  Phone Note Call from Patient Call back at Home Phone [phone]   Caller: Patient Reason for Call: Talk to Nurse Summary of Call: Pls contact pt re. Rx, pharmacy refused refill with fax, they said it will need to be called in  Initial call taken by: Lannette Donath,  Jul 29, 2009 11:28 AM  Follow-up for Phone Call        attempted to contact patient regarding rx, no answer no voice message. Patient will need to pick rx up from office. Rx left at front desk for patient pick up Follow-up by: Glendell Docker CMA,  Jul 29, 2009 11:33 AM  Additional Follow-up for Phone Call Additional follow up Details #1::        Pt notified that rx. is at front desk to pick. Pt. states she will have husband pick it up today.  Mervin Kung CMA  Jul 29, 2009 2:06 PM

## 2010-04-18 NOTE — Assessment & Plan Note (Signed)
Summary: cpx w/pap/dt   Vital Signs:  Patient profile:   50 year old female Height:      62 inches Weight:      104.25 pounds BMI:     19.14 O2 Sat:      100 % on Room air Temp:     98.2 degrees F oral Pulse rate:   80 / minute Pulse rhythm:   regular Resp:     18 per minute BP sitting:   100 / 60  (right arm) Cuff size:   regular  Vitals Entered By: Glendell Docker CMA (Jul 27, 2009 10:54 AM)  O2 Flow:  Room air CC: Rm 2 CPX Is Patient Diabetic? No   Primary Care Provider:  Dondra Spry DO  CC:  Rm 2 CPX.  History of Present Illness: 50 y/o AA female for routine CPX She has hx of hysterectomy no vaginal symptoms  she c/o intermittent low back pain previously tx ed for lumbar strain no leg weakness symptoms worse with activity no radiation of pain  Preventive Screening-Counseling & Management  Alcohol-Tobacco     Smoking Status: current  Allergies (verified): No Known Drug Allergies  Past History:  Past Medical History: History of pseudoseizure disorder Migraine Headache Hx of Anemia    History of Uterine leiomyomata  2006  History of Menometrorrhagia  2006       Past Surgical History: Appendectomy  Hysterectomy and BSO  04/06/2004  Rotator cuff repair   Colonoscopy 11/26/2005 (Internal hemorrhoid)         Family History: Family History Diabetes 1st degree relative Family History Hypertension        Social History:  Married Former Smoker   Alcohol use-no         Review of Systems       intermittent low back pain,  no urinary complaints  Physical Exam  General:  thin, pleasant 50 y/o AA female Head:  normocephalic and atraumatic.   Ears:  R ear normal and L ear normal.   Mouth:  pharynx pink and moist.   Lungs:  normal respiratory effort and normal breath sounds.   Heart:  normal rate, regular rhythm, and no gallop.   Abdomen:  soft, non-tender, normal bowel sounds, and no masses.   Msk:  increased muscle tone of lumbar paraspinal  muscles Extremities:  No lower extremity edema  Neurologic:  cranial nerves II-XII intact, strength normal in all extremities, gait normal, and DTRs symmetrical and normal.     Impression & Recommendations:  Problem # 1:  PREVENTIVE HEALTH CARE (ICD-V70.0) PAP deferred due to hx of hysterectomy.  vulvovaginal exam advised every other year. arrange screeing mammo  Mammogram: Done (07/08/2006) Colonoscopy: Done (11/26/2005) Td Booster: Td (04/28/2007)   Flu Vax: declined (11/14/2006)   Pneumovax: given (01/09/2008) TSH: 1.32 (05/29/2007)     Problem # 2:  LUMBAGO (ICD-724.2) 50 y/o with chronic intermittent low back pain.  probable spondylosis.  due to chronicity - obtain x rays Her updated medication list for this problem includes:    Flexeril 10 Mg Tabs (Cyclobenzaprine hcl) .Marland Kitchen... 1 tab by mouth at bedtime as needed back pain    Nucynta 75 Mg Tabs (Tapentadol hcl) .Marland Kitchen... Take 1 tablet by mouth bid  as needed pain  Orders: T-Lumbar Spine 2 Views (72100TC) T-Hip Comp Right Min 2 views (73510TC)  Complete Medication List: 1)  Omeprazole 20 Mg Cpdr (Omeprazole) .... One by mouth two times a day before meals 2)  Flexeril 10  Mg Tabs (Cyclobenzaprine hcl) .Marland Kitchen.. 1 tab by mouth at bedtime as needed back pain 3)  Gabapentin 300 Mg Caps (Gabapentin) .... Take 1 tab by mouth at bedtime 4)  Clotrimazole-betamethasone 1-0.05 % Crea (Clotrimazole-betamethasone) .... Apply two times a day x 2 weeks 5)  Citalopram Hydrobromide 40 Mg Tabs (Citalopram hydrobromide) .... One by mouth once daily 6)  Hydroxyzine Hcl 25 Mg Tabs (Hydroxyzine hcl) .... Take 1 tablet by mouth four times a day 7)  Nucynta 75 Mg Tabs (Tapentadol hcl) .... Take 1 tablet by mouth bid  as needed pain 8)  Doxycycline Hyclate 100 Mg Caps (Doxycycline hyclate) .... Take 1 tablet by mouth two times a day x 14 days  Other Orders: EKG w/ Interpretation (93000) Radiology Referral (Radiology)  Current Allergies (reviewed today): No  known allergies

## 2010-04-18 NOTE — Letter (Signed)
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 [address]   [place], [place] [address]   Phone: [phone]  Fax: [phone]    December 24, 2007   Employee:  Ruth Bell    To Whom It May Concern:   For Medical reasons, please excuse the above named employee from work for the following dates:  Start:   12/24/2007  End:   12/29/2007  If you need additional information, please feel free to contact our office.         Sincerely,       Darra Lis RMA

## 2010-04-18 NOTE — Letter (Signed)
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 [address]   [place], [place] [address]   Phone: [phone]  Fax: [phone]      December 23, 2009    Employee:  Ruth Bell      To Whom It May Concern:   For Medical reasons, please excuse the above named employee from work for the following dates:  Start:   12/22/2009  End:   12/26/2009  She may resume normal work schedule on 12/27/2009.If you need additional information, please feel free to contact our office.         Sincerely,    Glendell Docker CMA Dr. Thomos Lemons

## 2010-04-18 NOTE — Assessment & Plan Note (Signed)
Summary: congestion no better/dt   Vital Signs:  Patient profile:   50 year old female Height:      62 inches Weight:      107 pounds BMI:     19.64 O2 Sat:      99 % on Room air Temp:     98.1 degrees F oral Pulse rate:   73 / minute Pulse rhythm:   regular Resp:     20 per minute BP sitting:   140 / 80  (right arm) Cuff size:   regular  Vitals Entered By: Glendell Docker CMA (December 29, 2009 10:26 AM)  O2 Flow:  Room air CC: Congestion Is Patient Diabetic? No Pain Assessment Patient in pain? no      Comments unresolved for the past 3 weeks   Primary Care Provider:  Dondra Spry DO  CC:  Congestion.  History of Present Illness: 50 y/o  female recently seen for severe chest pain / burning with cough for ER f/u ER eval - CXR - negative D Dimer negative   cardiac enzyme was negative persistent cough no fever or chills pain with taking deep breath  ER records reviewed  increased work stress  ER records reviewed  Preventive Screening-Counseling & Management  Alcohol-Tobacco     Smoking Status: current  Allergies (verified): No Known Drug Allergies  Past History:  Past Medical History: History of pseudoseizure disorder Migraine Headache  Hx of Anemia    History of Uterine leiomyomata  2006  History of Menometrorrhagia  2006       Family History: Family History Diabetes 1st degree relative Family History Hypertension         Social History:  Married Former Smoker   Alcohol use-no            Review of Systems       The patient complains of chest pain and prolonged cough.    Physical Exam  General:  alert, well-developed, and well-nourished.   Head:  normocephalic and atraumatic.   Ears:  R ear normal and L ear normal.   Mouth:  pharynx pink and moist.   Neck:  supple, no masses, and no neck tenderness.   Chest Wall:  mild anterior chest wall tenderness Lungs:  normal respiratory effort and normal breath sounds.   Heart:  normal  rate, regular rhythm, and no gallop.   Abdomen:  soft, non-tender, and normal bowel sounds.   Extremities:  No lower extremity edema  Neurologic:  cranial nerves II-XII intact.   Psych:  slightly anxious and agitated.     Impression & Recommendations:  Problem # 1:  PLEURISY (ICD-511.0) 50 y/o AA female with symptoms of pleurisy.  rule out chest mass.  prednisone taper   Orders: CT with Contrast (CT w/ contrast)  Problem # 2:  ADJUSTMENT DISORDER WITH ANXIOUS MOOD (ICD-309.24) she responed well to citalopram in the past rx'ed by neurologist.  she stopped on her own. I suggest we restart SSRI - sertraline  Complete Medication List: 1)  Omeprazole 20 Mg Cpdr (Omeprazole) .... One by mouth two times a day before meals 2)  Gabapentin 300 Mg Caps (Gabapentin) .... Take 1 tab by mouth at bedtime 3)  Clotrimazole-betamethasone 1-0.05 % Crea (Clotrimazole-betamethasone) .... Apply two times a day x 2 weeks 4)  Hydroxyzine Hcl 25 Mg Tabs (Hydroxyzine hcl) .... Take 1 tablet by mouth four times a day 5)  Nucynta 75 Mg Tabs (Tapentadol hcl) .... Take 1 tablet by mouth  bid  as needed pain 6)  Azithromycin 250 Mg Tabs (Azithromycin) .... As directed. 7)  Hydrocodone-homatropine 5-1.5 Mg Tabs (Hydrocodone-homatropine) .... 5ml by mouth every 4 - 6 hours as needed. 8)  Albuterol  .Marland Kitchen.. 1 - 2 inhalations every 4 -6 hours as needed. 9)  Promethazine Hcl 25 Mg Tabs (Promethazine hcl) .... One tablet by mouth every 4- 6 hours as needed 10)  Percocet 5-325 Mg Tabs (Oxycodone-acetaminophen) .... One tablet by mouth every 6 hours as needed pain 11)  Prednisone 10 Mg Tabs (Prednisone) .... 3 tabs by mouth once daily x 3 days, 2 tabs by mouth once daily x 3 days, 1 tab by mouth once daily x 3 days 18 12)  Sertraline Hcl 50 Mg Tabs (Sertraline hcl) .... 1/2 by mouth once daily x 7 days, then one by mouth once daily  Patient Instructions: 1)  Please schedule a follow-up appointment in 2  weeks. Prescriptions: SERTRALINE HCL 50 MG TABS (SERTRALINE HCL) 1/2 by mouth once daily x 7 days, then one by mouth once daily  #30 x 1   Entered and Authorized by:   D. Thomos Lemons DO   Signed by:   D. Thomos Lemons DO on 12/29/2009   Method used:   Electronically to        [id number] (retail)       [address] [place], [place]  [address]       Ph: [phone]       Fax: [phone]   RxID:   [id number] PREDNISONE 10 MG TABS (PREDNISONE) 3 tabs by mouth once daily x 3 days, 2 tabs by mouth once daily x 3 days, 1 tab by mouth once daily x 3 days 18  #18 x 0   Entered and Authorized by:   D. Thomos Lemons DO   Signed by:   D. Thomos Lemons DO on 12/29/2009   Method used:   Electronically to        [id number] (retail)       [address] [place], [place]  [address]       Ph: [phone]       Fax: [phone]   RxID:   [id number]       Current Allergies (reviewed today): No known allergies

## 2010-04-18 NOTE — Progress Notes (Signed)
Summary: Test Results & pain medication  Phone Note Outgoing Call   Summary of Call: call pt - low back and hip x ray is negative for fracture  Initial call taken by: D. Thomos Lemons DO,  Jul 27, 2009 9:44 PM  Follow-up for Phone Call        patient advised per Dr Artist Pais instructions. She states that her back is still bothering her and she is in a lot of pain, not relieved by Ibuprofen. She would like to know if she could get a rx for pain Follow-up by: Glendell Docker CMA,  Jul 28, 2009 2:08 PM  Additional Follow-up for Phone Call Additional follow up Details #1::        please fax rx to pharm Additional Follow-up by: D. Thomos Lemons DO,  Jul 28, 2009 4:59 PM    Additional Follow-up for Phone Call Additional follow up Details #2::    Rx faxed to pharmacy patient has been advised Follow-up by: Glendell Docker CMA,  Jul 28, 2009 5:23 PM  New/Updated Medications: NUCYNTA 75 MG TABS (TAPENTADOL HCL) Take 1 tablet by mouth bid  as needed pain Prescriptions: NUCYNTA 75 MG TABS (TAPENTADOL HCL) Take 1 tablet by mouth bid  as needed pain  #15 x 0   Entered and Authorized by:   D. Thomos Lemons DO   Signed by:   D. Thomos Lemons DO on 07/28/2009   Method used:   Printed then faxed to ...       [facility] [address] [facility]       [address] [place], [place]  [address]       Ph: [phone]       Fax: [phone]   RxID:   [id number]

## 2010-04-18 NOTE — Letter (Signed)
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 [address]   [place], [place] [address]   Phone: [phone]  Fax: [phone]       January 13, 2008       Employee:  IVI GRIFFITH        To Whom It May Concern:   For Medical reasons, please excuse the above named employee from work for the following dates:  Start:   01/13/08  End:   01/20/08  If you need additional information, please feel free to contact our office.         Sincerely,    Darlene Terrilee Croak CMA Dr Thomos Lemons  Faxed to [employer] @ [phone] Glendell Docker CMA  January 13, 2008 5:17 PM   Appended Document: Out of Work Patient states that employer would not let her return to work because she is coughing. Patient states that employer says she needs another note to return to work. I explained to her that Dr. Artist Pais cleared her to return to work and that this note should be sufficient for the employer. Note printed and faxed again to [employer] at [phone]

## 2010-04-18 NOTE — Letter (Signed)
Summary: [facility]  [facility]   Imported By: Lanelle Bal 04/18/2009 12:50:21  _____________________________________________________________________  External Attachment:    Type:   Image     Comment:   External Document

## 2010-04-18 NOTE — Letter (Signed)
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 [address]   [place], [place] [address]   Phone: [phone]  Fax: [phone]      May 27, 2008      Employee:  Ruth Bell      To Whom It May Concern:   For Medical reasons, please excuse the above named employee from work for the following dates:  Start:   05/27/2008  End:   06/03/2008  If you need additional information, please feel free to contact our office.           Sincerely,    Glendell Docker CMA Dr Thomos Lemons

## 2010-04-18 NOTE — Assessment & Plan Note (Signed)
Summary: back ache- jr   Vital Signs:  Patient profile:   50 year old female Weight:      97.75 pounds BMI:     17.94 Temp:     98.2 degrees F oral Pulse rate:   80 / minute Pulse rhythm:   regular Resp:     18 per minute BP sitting:   118 / 80  (left arm) Cuff size:   regular  Vitals Entered By: Glendell Docker CMA (October 11, 2008 2:21 PM)  Primary Care Provider:  Dondra Spry DO  CC:  Back Pain.  History of Present Illness: c/o mid to lower back pain with swelling and numbness in left leg, patient states daily activity is affecting dialy activity, she has been out of work for the past 3 weeks. Patient states nothing improves it. She was given a rx for oxycontin taken with no improvement.  Her symptoms exacerbated by lifting linens at work.       Allergies (verified): No Known Drug Allergies  Past History:  Past Medical History: History of pseudoseizure disorder Migraine Headache Hx of Anemia  History of Uterine leiomyomata  2006 History of Menometrorrhagia  2006       Past Surgical History: Appendectomy  Hysterectomy and BSO  04/06/2004  Rotator cuff repair Colonoscopy 11/26/2005 (Internal hemorrhoid)       Family History: Family History Diabetes 1st degree relative Family History Hypertension     Social History: Occupation: works at  [employer]  Married Former Smoker  Alcohol use-no       Physical Exam  General:  pt in acute distress.  pt became unresponsive and experienced convulsions due to severe back pain Head:  normocephalic and atraumatic.   Lungs:  normal respiratory effort, normal breath sounds, and no wheezes.   Heart:  normal rate, regular rhythm, and no gallop.   Neurologic:  unresponsive to verbal stimuli.   tonic clonic jerks,  no loss of bladder control   Impression & Recommendations:  Problem # 1:  LUMBAGO (ICD-724.2) Pt with persistent severe low back pain.  Prev MRI of lumbar spine - 05/2008  1.  Mild lumbar spondylosis as  described with mild facet hypertrophy and mild disc bulging at L5-S1. 2.  No disc herniation, foraminal compromise or nerve root encroachment.  I suspect pain from lumbar spasm vs spondylosis.  Refer to ortho for further eval.  Start gabapentin.  Defer to ortho - whether to repeat MRI of LS spine.  Her updated medication list for this problem includes:    Flexeril 10 Mg Tabs (Cyclobenzaprine hcl) .Marland Kitchen... 1 tab by mouth at bedtime as needed back pain    Oxycodone-acetaminophen 5-325 Mg Tabs (Oxycodone-acetaminophen) .Marland Kitchen... 1 tab by mouth three times a day as needed severe pain  Orders: Orthopedic Referral (Ortho)  Problem # 2:  ? of SEIZURE DISORDER (PSEUDO) (ICD-780.39)  Pt had possible seizure vs pseudoseizure due to pain response.   Pt referred to [facility] for further eval.  O2 sat remained stable during episode.   Pt monitored during entire episode.   No secondary injury.   Her updated medication list for this problem includes:    Gabapentin 300 Mg Caps (Gabapentin) ..... One by mouth qhs  Complete Medication List: 1)  Hyoscyamine Sulfate 0.125 Mg Subl (Hyoscyamine sulfate) .... 2 tabets sublinguinal every 4 hours as needed 2)  Flunisolide 0.025 % Soln (Flunisolide) .... 2 sprays each nostril once daily 3)  Proair Hfa 108 (90 Base) Mcg/act Aers (  Albuterol sulfate) .... 2 puffs by mouth two times a day as needed 4)  Amitriptyline Hcl 10 Mg Tabs (Amitriptyline hcl) .... One tablet by mouth at bedtime 5)  Omeprazole 20 Mg Cpdr (Omeprazole) .... One by mouth two times a day before meals 6)  Flexeril 10 Mg Tabs (Cyclobenzaprine hcl) .Marland Kitchen.. 1 tab by mouth at bedtime as needed back pain 7)  Oxycodone-acetaminophen 5-325 Mg Tabs (Oxycodone-acetaminophen) .Marland Kitchen.. 1 tab by mouth three times a day as needed severe pain 8)  Gabapentin 300 Mg Caps (Gabapentin) .... One by mouth qhs Prescriptions: GABAPENTIN 300 MG CAPS (GABAPENTIN) one by mouth qhs  #30 x 1   Entered and Authorized by:   D. Thomos Lemons  DO   Signed by:   D. Thomos Lemons DO on 10/11/2008   Method used:   Electronically to        [id number] (retail[facility]       [address] [place], [place]  [address]       Ph: [phone]       Fax: [phone]   RxID:   [id number]   Current Allergies (reviewed today): No known allergies

## 2010-04-18 NOTE — Progress Notes (Signed)
Summary: CT results  Phone Note Outgoing Call   Summary of Call: call pt - CT of chest normal Initial call taken by: D. Thomos Lemons DO,  December 29, 2009 5:26 PM  Follow-up for Phone Call        call placed to patient at [phone], she has been advised per Dr Artist Pais instructions Follow-up by: Glendell Docker CMA,  December 30, 2009 9:08 AM

## 2010-04-18 NOTE — Assessment & Plan Note (Signed)
Summary: Note to return to work/DK   Vital Signs:  Patient Profile:   50 Years Old Female Height:     62 inches Weight:      110.75 pounds BMI:     20.33 Temp:     98.5 degrees F oral Pulse rate:   84 / minute Pulse rhythm:   regular Resp:     18 per minute BP sitting:   116 / 80  (right arm)  Vitals Entered By: Glendell Docker CMA (February 05, 2008 3:38 PM)                 PCP:  Dondra Spry DO  Chief Complaint:  Follow up.  History of Present Illness: 50 y/o AA female for follow up.   Pt's states employers is requesting clearance for work.  She was previously seen for flu like illness associated with severe fatigue.  Her symptoms have significantly improved.   She denies cough or SOB.   Her energy level is back to normal.    Current Allergies (reviewed today): No known allergies   Past Medical History:    History of pseudoseizure disorder    Migraine Headache    Anemia    History of Uterine leiomyomata  2006    History of Menometrorrhagia  2006      Past Surgical History:    Appendectomy    Hysterectomy and BSO  04/06/2004    Rotator cuff repair    Colonoscopy 11/26/2005 (Internal hemorrhoid)          Physical Exam  General:     alert, well-developed, and well-nourished.   Head:     normocephalic and atraumatic.   Neck:     supple and no masses.   Lungs:     normal respiratory effort, normal breath sounds, and no wheezes.   Heart:     normal rate, regular rhythm, and no gallop.   Extremities:     No lower extremity edema     Impression & Recommendations:  Problem # 1:  VIRAL INFECTION (ICD-079.99) Assessment: Improved Viral illness resolved.   Cough resolved.  Pt cleared to return to work without limitations.    The following medications were removed from the medication list:    Robitussin Cough Long-acting 15 Mg/55ml Syrp (Dextromethorphan hbr) .Marland Kitchen... 2 tsp by mouth every 4 hours as needed   Complete Medication List: 1)  Endocet 5-325  Mg Tabs (Oxycodone-acetaminophen) .... One tablet by mouth every 12 hours as needed 2)  Hyoscyamine Sulfate 0.125 Mg Subl (Hyoscyamine sulfate) .... 2 tabets sublinguinal every 4 hours as needed 3)  Flunisolide 0.025 % Soln (Flunisolide) .... 2 sprays each nostril once daily 4)  Proair Hfa 108 (90 Base) Mcg/act Aers (Albuterol sulfate) .... 2 puffs by mouth two times a day as needed 5)  Amitriptyline Hcl 10 Mg Tabs (Amitriptyline hcl) .... One tablet by mouth at bedtime 6)  Omeprazole 20 Mg Cpdr (Omeprazole) .... One by mouth two times a day before meals    ] Current Allergies (reviewed today): No known allergies

## 2010-04-18 NOTE — Progress Notes (Signed)
Summary: Cough  Phone Note Call from Patient Call back at Home Phone [phone]   Caller: Patient Call For: D. Thomos Lemons DO Summary of Call: patient called and left voice message requesting a refill on the cough syrup Initial call taken by: Glendell Docker CMA,  July 12, 2009 6:24 PM  Follow-up for Phone Call        Patient advised that she will need follow up appointment for evaluation regarding her cough.  She was given an appointment with Melissa at 10:30 am Follow-up by: Glendell Docker CMA,  July 12, 2009 6:28 PM

## 2010-04-18 NOTE — Letter (Signed)
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 [address]   [place], [place] [address]   Phone: [phone]  Fax: [phone]      December 29, 2009     Employee:  Ruth Bell    To Whom It May Concern:   For Medical reasons, please excuse the above named employee from work for the following dates:  Start:   12/29/2009  End:   01/02/2010  If you need additional information, please feel free to contact our office.          Sincerely,    Glendell Docker CMA Dr Thomos Lemons

## 2010-04-18 NOTE — Progress Notes (Signed)
  Phone Note Call from Patient   Caller: Patient Summary of Call: Pt. was seen this A.M. by Dr. Cathey Endow  and states that her superviser said it would be better to take the pt. out for 3 weeks instead of 2 weeks so workmans Comp. will cover her.  Will you leave pt. a note at the front desk for pick up?  Pt. wants a call back to let  her know status of this, # [phone] Initial call taken by: Michaelle Copas,  September 27, 2008 1:58 PM  Follow-up for Phone Call        it was a work restriction note (not an out of work note) but I will redo it. Follow-up by: Seymour Bars DO,  September 27, 2008 2:01 PM     Appended Document:  Pt aware

## 2010-04-18 NOTE — Assessment & Plan Note (Signed)
Summary: UNRESOLVED ABD PAIN/DK   Vital Signs:  Patient Profile:   50 Years Old Female Height:     62 inches Temp:     98.1 degrees F oral Pulse rate:   82 / minute BP sitting:   130 / 66  (right arm)  Vitals Entered By: Glendell Docker (June 13, 2007 1:18 PM)                 Chief Complaint:  C/O UNRESOLVED ABDOMINAL PAIN and Abdominal pain.  History of Present Illness:  Abdominal Pain      This is a 50 year old woman who presents with Abdominal pain.  The patient reports nausea, but denies melena, hematochezia, and hematemesis.  The location of the pain is right lower quadrant.  The pain is described as intermittent.  The patient denies the following symptoms: fever.  She was treated for possible gastroenteritis on prev visit.  We reviewed previous blood work (negative).  She did have hematuria.  Cipro was called in to her pharmacy but she never picked up prescription.    Current Allergies: No known allergies   Past Medical History:    Reviewed history from 05/29/2007 and no changes required:       History of pseudoseizure disorder       Migraine Headache       Anemia       History of Uterine leiomyomata  2006       History of Menometrorrhagia  2006  Past Surgical History:    Reviewed history from 05/29/2007 and no changes required:       Appendectomy       Hysterectomy and BSO  04/06/2004       Rotator cuff repair       Colonoscopy 11/26/2005 (Internal hemorrhoid)   Social History:    Reviewed history from 04/28/2007 and no changes required:       Occupation: works at  [employer]       Married       Former Smoker       Alcohol use-no     Physical Exam  General:     alert, well-developed, and well-nourished.   Mouth:     Oral mucosa and oropharynx without lesions or exudates.  Lungs:     normal respiratory effort and normal breath sounds.   Heart:     normal rate, no murmur, no gallop, and no rub.   Abdomen:     soft, no masses, no guarding, no  rigidity, and RLQ tenderness.   Genitalia:     no vaginal discharge, no vaginal or cervical lesions, and vaginal atrophy.   Extremities:     No lower extremity edema  Neurologic:     alert & oriented X3 and cranial nerves II-XII intact.   Skin:     turgor normal and color normal.   Psych:     normally interactive and good eye contact.      Impression & Recommendations:  Problem # 1:  ABDOMINAL PAIN (ICD-789.00) 50 y/o with persistent RLQ abdominal pain and hematuria.  She also is experiencing some dysruia.  Her prev UA positive for blood but no sign of infection.  I suspect nephrolithiasis. Obtain CT of Abd and Pelvis.  Emprically treat with Cipro 500 mg by mouth two times a day.   Percocet for pain.  I rec pt increase fluid intake.  Patient advised to call office if symptoms worsen or seek emergency care.  F/U in  2 wks.  Orders: Radiology Referral (Radiology)   Complete Medication List: 1)  Topamax 50 Mg Tabs (Topiramate) .... Take 1 tablet by mouth once a day 2)  Hyoscyamine Sulfate 0.125 Mg Subl (Hyoscyamine sulfate) .... 2 tabets sublinguinal every 4 hours as needed 3)  Promethazine Hcl 12.5 Mg Tabs (Promethazine hcl) .... One by mouth q 8 hrs prn 4)  Cipro 500 Mg Tabs (Ciprofloxacin hcl) .... One by mouth two times a day 5)  Percocet 5-325 Mg Tabs (Oxycodone-acetaminophen) .... One by mouth q 12 prn   Patient Instructions: 1)  Please schedule a follow-up appointment in 2 weeks. 2)  Call office if your symptoms worsen or seek emergency medical care.    Prescriptions: PERCOCET 5-325 MG  TABS (OXYCODONE-ACETAMINOPHEN) one by mouth q 12 prn  #30 x 0   Entered and Authorized by:   D. Thomos Lemons DO   Signed by:   D. Thomos Lemons DO on 06/13/2007   Method used:   Print then Give to Patient   RxID:   [id number] CIPRO 500 MG  TABS (CIPROFLOXACIN HCL) one by mouth two times a day  #20 x 0   Entered and Authorized by:   D. Thomos Lemons DO   Signed by:   D. Thomos Lemons DO on  06/13/2007   Method used:   [facility] ...       [phone][facility]       [address]       [place], [place]  [address]       Ph: [phone]       Fax: [phone]   RxID:   [id number]  ]

## 2010-04-18 NOTE — Progress Notes (Signed)
Summary: Cough Syrup   Phone Note Refill Request Message from:  Fax from Pharmacy on January 19, 2008 4:58 PM  Refills Requested: Medication #1:  ROBITUSSIN COUGH LONG-ACTING 15 MG/5ML SYRP 2 tsp by mouth every 4 hours as needed [employer] [phone]   Method Requested: Telephone to Pharmacy Next Appointment Scheduled: 01/20/08 @ 3:15p Initial call taken by: Glendell Docker CMA,  January 19, 2008 4:59 PM  Follow-up for Phone Call        ok to refill x 1 only.   If persistent cough, needs OV Follow-up by: D. Thomos Lemons DO,  January 19, 2008 5:24 PM  Additional Follow-up for Phone Call Additional follow up Details #1::        Rx refill called to pharmacy Additional Follow-up by: Glendell Docker CMA,  January 19, 2008 5:34 PM    New/Updated Medications: ROBITUSSIN COUGH LONG-ACTING 15 MG/5ML SYRP (DEXTROMETHORPHAN HBR) 2 tsp by mouth every 4 hours as needed   Prescriptions: ROBITUSSIN COUGH LONG-ACTING 15 MG/5ML SYRP (DEXTROMETHORPHAN HBR) 2 tsp by mouth every 4 hours as needed  #15 x 1   Entered by:   Glendell Docker CMA   Authorized by:   D. Thomos Lemons DO   Signed by:   Glendell Docker CMA on 01/19/2008   Method used:   Telephoned to ...       [facility] [phone]* (retail)       [address]       [place], [place]  [address]       Ph: [phone]       Fax: [phone]   RxID:   [id number]

## 2010-04-18 NOTE — Assessment & Plan Note (Signed)
Summary: 2 WK FU-$50-STC   Vital Signs:  Patient Profile:   50 Years Old Female Height:     62 inches Temp:     97.2 degrees F oral Pulse rate:   80 / minute BP sitting:   138 / 86  (right arm)  Vitals Entered By: Glendell Docker (June 24, 2007 9:00 AM)                 Chief Complaint:  2 WEEK FOLLOW UP DISCUSSCT RESULTS .  History of Present Illness: 50 y/o AA female for follow up regarding abd pain.  Her discomfort has improved.   We discussed u/a findings (microscopic hematuria).  She has almost finish rx for cipro.   She reports occ right flank discomfort.  She denies gross hematuria.    Current Allergies (reviewed today): No known allergies   Past Medical History:    Reviewed history from 06/13/2007 and no changes required:       History of pseudoseizure disorder       Migraine Headache       Anemia       History of Uterine leiomyomata  2006       History of Menometrorrhagia  2006  Past Surgical History:    Reviewed history from 06/13/2007 and no changes required:       Appendectomy       Hysterectomy and BSO  04/06/2004       Rotator cuff repair       Colonoscopy 11/26/2005 (Internal hemorrhoid)   Social History:    Reviewed history from 04/28/2007 and no changes required:       Occupation: works at  [employer]       Married       Former Smoker       Alcohol use-no    Review of Systems      See HPI   Physical Exam  General:     alert, well-developed, and well-nourished.   Lungs:     normal respiratory effort and normal breath sounds.   Heart:     normal rate, regular rhythm, no murmur, and no gallop.   Abdomen:     soft,  mid abd tenderness.  soft, no rigidity, and no rebound tenderness.   Extremities:     No lower extremity edema     Impression & Recommendations:  Problem # 1:  MICROSCOPIC HEMATURIA (ICD-599.72) CT of abd and pelvis is negative for renal mass.   There is question of tiny stone in left renal pelvis.  Arrange follow up  with urology.  Rule out bladder lesion.  Pt empircally treated with cipro.  Orders: Urology Referral (Urology) TLB-Udip w/ Micro (81001-URINE)  Her updated medication list for this problem includes:    Cipro 500 Mg Tabs (Ciprofloxacin hcl) ..... One by mouth two times a day   Complete Medication List: 1)  Endocet 5-325 Mg Tabs (Oxycodone-acetaminophen) .... One tablet by mouth every 12 hours as needed 2)  Hyoscyamine Sulfate 0.125 Mg Subl (Hyoscyamine sulfate) .... 2 tabets sublinguinal every 4 hours as needed 3)  Promethazine Hcl 12.5 Mg Tabs (Promethazine hcl) .... One by mouth q 8 hrs prn 4)  Cipro 500 Mg Tabs (Ciprofloxacin hcl) .... One by mouth two times a day 5)  Amitriptyline Hcl 10 Mg Tabs (Amitriptyline hcl) .... 3 tablets by mouth at bedtime   Patient Instructions: 1)  Please schedule a follow-up appointment in 2 months.    ] Current Allergies (  reviewed today): No known allergies

## 2010-04-18 NOTE — Assessment & Plan Note (Signed)
Summary: 1 WEEK ROV-CH   Vital Signs:  Patient Profile:   50 Years Old Female Height:     62 inches Weight:      100 pounds BMI:     18.36 O2 treatment:    Room Air Temp:     98.0 degrees F oral Pulse rate:   78 / minute Pulse rhythm:   regular Resp:     22 per minute BP sitting:   122 / 80  (right arm) Cuff size:   regular  Vitals Entered By: Darra Lis RMA (January 20, 2008 3:49 PM)                 PCP:  Dondra Spry DO  Chief Complaint:  follow up from EKG.  History of Present Illness: 50 year old left American female for follow-up.  Patient previously seen for orthopnea/ shortness of breath.  Since previous visit 2-D echocardiogram was negative for pericardial effusion or cardiomyopathy.  EF was normal.  There was note of possible diastolic dysfunction.   D-Dimer was also normal.   Patient complains of nocturnal cough.  She describes chest tightness when she lays down.  Her voice has been hoarse for last several weeks.  She reports possible seizure 4 days ago.      Current Allergies (reviewed today): No known allergies   Past Medical History:    History of pseudoseizure disorder    Migraine Headache    Anemia    History of Uterine leiomyomata  2006    History of Menometrorrhagia  2006     Past Surgical History:    Appendectomy    Hysterectomy and BSO  04/06/2004    Rotator cuff repair    Colonoscopy 11/26/2005 (Internal hemorrhoid)      Family History:    Family History Diabetes 1st degree relative    Family History Hypertension   Social History:    Occupation: works at  [employer]    Married    Former Smoker    Alcohol use-no           Review of Systems      See HPI   Physical Exam  General:     alert, well-developed, and well-nourished.   Head:     normocephalic and atraumatic.   Mouth:     hoarseness, pharynx pink and moist.   Neck:     supple and no masses.   Lungs:     normal respiratory effort, normal breath sounds, and no  wheezes.   Heart:     normal rate, regular rhythm, and no gallop.   Extremities:     No lower extremity edema  Neurologic:     cranial nerves II-XII intact and gait normal.      Impression & Recommendations:  Problem # 1:  SHORTNESS OF BREATH (ICD-786.05) Pt with nocturnal wheezing and chest tightness.   2 D Echo and D- Dimer normal.  2D echo suggests diastolic dysfunction.  Her symptoms may be attributable to GERD.  Trial of omeprazole 20 mg by mouth two times a day.  If no improvement, consider add low dose ARB to treat diastolic dysfunction.  Complete Medication List: 1)  Endocet 5-325 Mg Tabs (Oxycodone-acetaminophen) .... One tablet by mouth every 12 hours as needed 2)  Hyoscyamine Sulfate 0.125 Mg Subl (Hyoscyamine sulfate) .... 2 tabets sublinguinal every 4 hours as needed 3)  Promethazine Hcl 12.5 Mg Tabs (Promethazine hcl) .... One by mouth q 8 hrs prn 4)  Robitussin Cough Long-acting 15 Mg/70ml Syrp (Dextromethorphan hbr) .... 2 tsp by mouth every 4 hours as needed 5)  Flunisolide 0.025 % Soln (Flunisolide) .... 2 sprays each nostril once daily 6)  Proair Hfa 108 (90 Base) Mcg/act Aers (Albuterol sulfate) .... 2 puffs by mouth two times a day as needed 7)  Amitriptyline Hcl 10 Mg Tabs (Amitriptyline hcl) .... One tablet by mouth at bedtime 8)  Omeprazole 20 Mg Cpdr (Omeprazole) .... One by mouth two times a day before meals   Patient Instructions: 1)  Please schedule a follow-up appointment in 2 months.   Prescriptions: OMEPRAZOLE 20 MG CPDR (OMEPRAZOLE) one by mouth two times a day before meals  #60 x 2   Entered and Authorized by:   D. Thomos Lemons DO   Signed by:   D. Thomos Lemons DO on 01/20/2008   Method used:   [facility]        [facility] [phone]       [address]       [place], [place]  [address]       Ph: [phone] or [phone]       Fax: [phone]   RxID:   [id number]  ]

## 2010-04-18 NOTE — Assessment & Plan Note (Signed)
Summary: PER DR ON CALL COME IN FOR BACK PAIN-CH   Vital Signs:  Patient profile:   50 year old female Height:      62 inches Weight:      106.75 pounds BMI:     19.60 Temp:     98.4 degrees F oral Pulse rate:   80 / minute Pulse rhythm:   regular Resp:     18 per minute BP sitting:   136 / 90  (right arm)  Vitals Entered By: Glendell Docker CMA (May 27, 2008 8:27 AM)  Primary Care Provider:  Dondra Spry DO   History of Present Illness:  50 year old African American female for followup regarding low back pain. Since previous visit patient reports back pain has gotten worse. Patient reports severe left-sided back pain radiating down left leg. Patient finished prednisone and muscle relaxants.  No improvement with taking Endocet 5/325.  Allergies (verified): No Known Drug Allergies  Past Medical History:    History of pseudoseizure disorder    Migraine Headache    Anemia     History of Uterine leiomyomata  2006    History of Menometrorrhagia  2006       Past Surgical History:    Appendectomy    Hysterectomy and BSO  04/06/2004     Rotator cuff repair    Colonoscopy 11/26/2005 (Internal hemorrhoid)       Social History:    Occupation: works at  [employer]     Married    Former Smoker     Alcohol use-no         Review of Systems       No fever, no urinary symptoms.  Physical Exam  General:  alert, well-developed, and well-nourished.   Head:  normocephalic and atraumatic.   Lungs:  normal respiratory effort, normal breath sounds, and no wheezes.   Heart:  normal rate, regular rhythm, and no gallop.   Neurologic:  abnormal gait.  Difficult to assess muscle strength due to patient discomfort   Detailed Back/Spine Exam  Lumbosacral Exam:  Inspection-deformity:    Normal Palpation-spinal tenderness:  Abnormal   Impression & Recommendations:  Problem # 1:  LUMBAR RADICULOPATHY, LEFT (ICD-50.37) 50 year old [place] American female with refractory  left-sided back pain radiating to left leg. She has gait abnormality. Obtain MRI of lumbosacral spine. Increase oxycodone to 7.5/325. Patient also advised to take Celebrex once daily as needed x2 weeks. Her updated medication list for this problem includes:    Oxycodone-acetaminophen 7.5-325 Mg Tabs (Oxycodone-acetaminophen) ..... One by mouth q 8 hrs prn    Skelaxin 800 Mg Tabs (Metaxalone) ..... One by mouth three times a day prn    Celebrex 200 Mg Caps (Celecoxib) ..... One by mouth qd  Orders: Radiology Referral (Radiology)  Complete Medication List: 1)  Oxycodone-acetaminophen 7.5-325 Mg Tabs (Oxycodone-acetaminophen) .... One by mouth q 8 hrs prn 2)  Hyoscyamine Sulfate 0.125 Mg Subl (Hyoscyamine sulfate) .... 2 tabets sublinguinal every 4 hours as needed 3)  Flunisolide 0.025 % Soln (Flunisolide) .... 2 sprays each nostril once daily 4)  Proair Hfa 108 (90 Base) Mcg/act Aers (Albuterol sulfate) .... 2 puffs by mouth two times a day as needed 5)  Amitriptyline Hcl 10 Mg Tabs (Amitriptyline hcl) .... One tablet by mouth at bedtime 6)  Omeprazole 20 Mg Cpdr (Omeprazole) .... One by mouth two times a day before meals 7)  Azithromycin 250 Mg Tabs (Azithromycin) .... 2 tabs x 1, then one by mouth qd  8)  Prednisone 10 Mg Tabs (Prednisone) .... 3 tabs by mouth once daily x 3 days, 2 tabs by mouth once daily x 3 days, 1 tab by mouth once daily x 3 days 9)  Skelaxin 800 Mg Tabs (Metaxalone) .... One by mouth three times a day prn 10)  Celebrex 200 Mg Caps (Celecoxib) .... One by mouth qd  Patient Instructions: 1)  Please schedule a follow-up appointment in 2 weeks. Prescriptions: OXYCODONE-ACETAMINOPHEN 7.5-325 MG TABS (OXYCODONE-ACETAMINOPHEN) one by mouth q 8 hrs prn  #30 x 0   Entered and Authorized by:   D. Thomos Lemons DO   Signed by:   D. Thomos Lemons DO on 05/27/2008   Method used:   Print then Give to Patient   RxID:   [id number] CELEBREX 200 MG CAPS (CELECOXIB) one by mouth qd   #15 x 0   Entered and Authorized by:   D. Thomos Lemons DO   Signed by:   D. Thomos Lemons DO on 05/27/2008   Method used:   Electronically to        [facility]  [address] [phone]* (retail)       [address]       [place], [place]  [address]       Ph: [phone] or [phone]       Fax: [phone]   RxID:   [id number] OXYCODONE-ACETAMINOPHEN 7.5-325 MG TABS (OXYCODONE-ACETAMINOPHEN) one by mouth q 8 hrs prn  #30 x 0   Entered and Authorized by:   D. Thomos Lemons DO   Signed by:   D. Thomos Lemons DO on 05/27/2008   Method used:   Print then Give to Patient   RxID:   [id number]        Current Allergies (reviewed today): No known allergies

## 2010-04-18 NOTE — Progress Notes (Signed)
Summary: Work note  Phone Note Call from Patient   Caller: Patient Summary of Call: Patient just called back to our office and states that she would rather have the out of work note faxed to her job at [phone].  Initial call taken by: Michaelle Copas,  September 27, 2008 2:09 PM  Follow-up for Phone Call        did you notice my other note that I put her on work restrictions and not out of work? Follow-up by: Seymour Bars DO,  September 27, 2008 2:16 PM  Additional Follow-up for Phone Call Additional follow up Details #1::        Pt aware of the above.  Additional Follow-up by: Payton Spark CMA,  September 27, 2008 2:29 PM

## 2010-04-20 NOTE — Assessment & Plan Note (Signed)
Summary: TB skin test/hea  Nurse Visit   Allergies: No Known Drug Allergies  Immunizations Administered:  PPD Skin Test:    Vaccine Type: PPD    Site: right forearm    Mfr: Sanofi Pasteur    Dose: 0.1 ml    Route: ID    Given by: Glendell Docker CMA    Exp. Date: 11/18/2011    Lot #: [id number]  Orders Added: 1)  TB Skin Test [86580] 2)  Admin 1st Vaccine [phone]

## 2010-04-20 NOTE — Assessment & Plan Note (Signed)
Summary: read tb test/mhf  Nurse Visit   Primary Care Provider:  D. Thomos Lemons DO  CC:  TB SKIN TEST RECHECK.  History of Present Illness: The patient presented after 48 hours to check the injection site for positive or negative reaction.  Injection site examination: No firm bump forms at the test site. Slightly reddish appearance and diameter was smaller than 5mm  Assesessment & Plan Negative TB skin test.  Patient was counselef to call if experience any irritation of site   Ruth Bell [facility]  April 05, 2010 12:03 PM    Allergies: No Known Drug Allergies  PPD Results    Date of reading: 04/05/2010    Results: < 5mm    Interpretation: negative

## 2010-06-01 LAB — COMPREHENSIVE METABOLIC PANEL
ALT: 18 U/L (ref 0–35)
AST: 23 U/L (ref 0–37)
Albumin: 4.4 g/dL (ref 3.5–5.2)
Alkaline Phosphatase: 65 U/L (ref 39–117)
BUN: 10 mg/dL (ref 6–23)
CO2: 28 mEq/L (ref 19–32)
Calcium: 9.7 mg/dL (ref 8.4–10.5)
Chloride: 105 mEq/L (ref 96–112)
Creatinine, Ser: 0.6 mg/dL (ref 0.4–1.2)
GFR calc Af Amer: >60 mL/min (ref >60)
GFR calc non Af Amer: >60 mL/min (ref >60)
Glucose, Bld: 92 mg/dL (ref 70–99)
Potassium: 4.1 mEq/L (ref 3.5–5.1)
Sodium: 143 mEq/L (ref 135–145)
Total Bilirubin: 0.8 mg/dL (ref 0.3–1.2)
Total Protein: 7.8 g/dL (ref 6.0–8.3)

## 2010-06-01 LAB — DIFFERENTIAL
Basophils Absolute: 0.1 10*3/uL (ref 0.0–0.1)
Basophils Relative: 1 % (ref 0–1)
Eosinophils Absolute: 0.2 10*3/uL (ref 0.0–0.7)
Eosinophils Relative: 2 % (ref 0–5)
Lymphocytes Relative: 26 % (ref 12–46)
Lymphs Abs: 2.8 10*3/uL (ref 0.7–4.0)
Monocytes Absolute: 0.5 10*3/uL (ref 0.1–1.0)
Monocytes Relative: 5 % (ref 3–12)
Neutro Abs: 7.1 10*3/uL (ref 1.7–7.7)
Neutrophils Relative %: 66 % (ref 43–77)

## 2010-06-01 LAB — PROTIME-INR
INR: 0.96 (ref 0.00–1.49)
Prothrombin Time: 13 seconds (ref 11.6–15.2)

## 2010-06-01 LAB — CBC
HCT: 41.8 % (ref 36.0–46.0)
Hemoglobin: 13.6 g/dL (ref 12.0–15.0)
MCH: 27.6 pg (ref 26.0–34.0)
MCHC: 32.5 g/dL (ref 30.0–36.0)
MCV: 85.2 fL (ref 78.0–100.0)
Platelets: 302 10*3/uL (ref 150–400)
RBC: 4.9 MIL/uL (ref 3.87–5.11)
RDW: 14 % (ref 11.5–15.5)
WBC: 10.7 10*3/uL — ABNORMAL HIGH (ref 4.0–10.5)

## 2010-06-01 LAB — POCT CARDIAC MARKERS
CKMB, poc: <1 ng/mL — ABNORMAL LOW (ref 1.0–8.0)
Myoglobin, poc: 23.9 ng/mL (ref 12–200)
Troponin i, poc: <0.05 ng/mL (ref 0.00–0.09)

## 2010-06-01 LAB — POCT TOXICOLOGY PANEL
Opiates: POSITIVE
Tetrahydrocannabinol: POSITIVE

## 2010-06-01 LAB — D-DIMER, QUANTITATIVE: D-Dimer, Quant: 0.22 ug/mL-FEU (ref 0.00–0.48)

## 2010-06-01 LAB — LIPASE, BLOOD: Lipase: 75 U/L (ref 23–300)

## 2010-06-23 LAB — URINALYSIS, ROUTINE W REFLEX MICROSCOPIC
Bilirubin Urine: NEGATIVE
Glucose, UA: NEGATIVE mg/dL
Ketones, ur: NEGATIVE mg/dL
Leukocytes, UA: NEGATIVE
Nitrite: NEGATIVE
Protein, ur: NEGATIVE mg/dL
Specific Gravity, Urine: 1.006 (ref 1.005–1.030)
Urobilinogen, UA: 0.2 mg/dL (ref 0.0–1.0)
pH: 8 (ref 5.0–8.0)

## 2010-06-23 LAB — BASIC METABOLIC PANEL
BUN: 9 mg/dL (ref 6–23)
CO2: 29 mEq/L (ref 19–32)
Calcium: 9.7 mg/dL (ref 8.4–10.5)
Chloride: 103 mEq/L (ref 96–112)
Creatinine, Ser: 0.6 mg/dL (ref 0.4–1.2)
GFR calc Af Amer: >60 mL/min (ref >60)
GFR calc non Af Amer: >60 mL/min (ref >60)
Glucose, Bld: 80 mg/dL (ref 70–99)
Potassium: 4.6 mEq/L (ref 3.5–5.1)
Sodium: 141 mEq/L (ref 135–145)

## 2010-06-23 LAB — URINE MICROSCOPIC-ADD ON

## 2010-06-25 LAB — URINALYSIS, ROUTINE W REFLEX MICROSCOPIC
Bilirubin Urine: NEGATIVE
Glucose, UA: NEGATIVE mg/dL
Ketones, ur: NEGATIVE mg/dL
Leukocytes, UA: NEGATIVE
Nitrite: NEGATIVE
Protein, ur: NEGATIVE mg/dL
Specific Gravity, Urine: 1.018 (ref 1.005–1.030)
Urobilinogen, UA: 1 mg/dL (ref 0.0–1.0)
pH: 7.5 (ref 5.0–8.0)

## 2010-06-25 LAB — URINE MICROSCOPIC-ADD ON

## 2010-06-25 LAB — BASIC METABOLIC PANEL
BUN: 13 mg/dL (ref 6–23)
CO2: 32 mEq/L (ref 19–32)
Calcium: 9.7 mg/dL (ref 8.4–10.5)
Chloride: 104 mEq/L (ref 96–112)
Creatinine, Ser: 0.7 mg/dL (ref 0.4–1.2)
GFR calc Af Amer: >60 mL/min (ref >60)
GFR calc non Af Amer: >60 mL/min (ref >60)
Glucose, Bld: 85 mg/dL (ref 70–99)
Potassium: 4.2 mEq/L (ref 3.5–5.1)
Sodium: 144 mEq/L (ref 135–145)

## 2010-06-25 LAB — GLUCOSE, CAPILLARY: Glucose-Capillary: 87 mg/dL (ref 70–99)

## 2010-07-08 ENCOUNTER — Emergency Department (HOSPITAL_COMMUNITY): Payer: 59

## 2010-07-08 ENCOUNTER — Emergency Department (HOSPITAL_COMMUNITY)
Admission: EM | Admit: 2010-07-08 | Discharge: 2010-07-09 | Disposition: A | Discharge status: Home / Self Care | Payer: 59 | Attending: Emergency Medicine | Admitting: Emergency Medicine

## 2010-07-08 DIAGNOSIS — F3289 Other specified depressive episodes: Secondary | ICD-10-CM | POA: Insufficient documentation

## 2010-07-08 DIAGNOSIS — M549 Dorsalgia, unspecified: Secondary | ICD-10-CM | POA: Insufficient documentation

## 2010-07-08 DIAGNOSIS — Z79899 Other long term (current) drug therapy: Secondary | ICD-10-CM | POA: Insufficient documentation

## 2010-07-08 DIAGNOSIS — R569 Unspecified convulsions: Secondary | ICD-10-CM | POA: Insufficient documentation

## 2010-07-08 DIAGNOSIS — F329 Major depressive disorder, single episode, unspecified: Secondary | ICD-10-CM | POA: Insufficient documentation

## 2010-07-08 DIAGNOSIS — F29 Unspecified psychosis not due to a substance or known physiological condition: Secondary | ICD-10-CM | POA: Insufficient documentation

## 2010-07-08 DIAGNOSIS — R4182 Altered mental status, unspecified: Secondary | ICD-10-CM | POA: Insufficient documentation

## 2010-07-08 DIAGNOSIS — G8929 Other chronic pain: Secondary | ICD-10-CM | POA: Insufficient documentation

## 2010-07-08 LAB — COMPREHENSIVE METABOLIC PANEL
ALT: 19 U/L (ref 0–35)
AST: 30 U/L (ref 0–37)
Albumin: 4 g/dL (ref 3.5–5.2)
Alkaline Phosphatase: 62 U/L (ref 39–117)
BUN: 11 mg/dL (ref 6–23)
CO2: 20 mEq/L (ref 19–32)
Calcium: 8.8 mg/dL (ref 8.4–10.5)
Chloride: 104 mEq/L (ref 96–112)
Creatinine, Ser: 0.62 mg/dL (ref 0.4–1.2)
GFR calc Af Amer: >60 mL/min (ref >60)
GFR calc non Af Amer: >60 mL/min (ref >60)
Glucose, Bld: 83 mg/dL (ref 70–99)
Potassium: 3.7 mEq/L (ref 3.5–5.1)
Sodium: 139 mEq/L (ref 135–145)
Total Bilirubin: 0.7 mg/dL (ref 0.3–1.2)
Total Protein: 7.1 g/dL (ref 6.0–8.3)

## 2010-07-08 LAB — URINALYSIS, ROUTINE W REFLEX MICROSCOPIC
Bilirubin Urine: NEGATIVE
Glucose, UA: NEGATIVE mg/dL
Ketones, ur: NEGATIVE mg/dL
Leukocytes, UA: NEGATIVE
Nitrite: NEGATIVE
Protein, ur: NEGATIVE mg/dL
Specific Gravity, Urine: 1.011 (ref 1.005–1.030)
Urobilinogen, UA: 0.2 mg/dL (ref 0.0–1.0)
pH: 6 (ref 5.0–8.0)

## 2010-07-08 LAB — CBC
HCT: 37.9 % (ref 36.0–46.0)
Hemoglobin: 13.2 g/dL (ref 12.0–15.0)
MCH: 28.1 pg (ref 26.0–34.0)
MCHC: 34.8 g/dL (ref 30.0–36.0)
MCV: 80.6 fL (ref 78.0–100.0)
Platelets: 336 10*3/uL (ref 150–400)
RBC: 4.7 MIL/uL (ref 3.87–5.11)
RDW: 15.2 % (ref 11.5–15.5)
WBC: 9.6 10*3/uL (ref 4.0–10.5)

## 2010-07-08 LAB — ETHANOL: Alcohol, Ethyl (B): 182 mg/dL — ABNORMAL HIGH (ref 0–10)

## 2010-07-08 LAB — POCT I-STAT 3, ART BLOOD GAS (G3+)
Acid-base deficit: 1 mmol/L (ref 0.0–2.0)
Bicarbonate: 24.8 mEq/L — ABNORMAL HIGH (ref 20.0–24.0)
O2 Saturation: 94 %
Patient temperature: 98.6
TCO2: 26 mmol/L (ref 0–100)
pCO2 arterial: 46.6 mmHg — ABNORMAL HIGH (ref 35.0–45.0)
pH, Arterial: 7.334 — ABNORMAL LOW (ref 7.350–7.400)
pO2, Arterial: 79 mmHg — ABNORMAL LOW (ref 80.0–100.0)

## 2010-07-08 LAB — DIFFERENTIAL
Basophils Absolute: 0.1 10*3/uL (ref 0.0–0.1)
Basophils Relative: 1 % (ref 0–1)
Eosinophils Absolute: 0.2 10*3/uL (ref 0.0–0.7)
Eosinophils Relative: 2 % (ref 0–5)
Lymphocytes Relative: 41 % (ref 12–46)
Lymphs Abs: 3.9 10*3/uL (ref 0.7–4.0)
Monocytes Absolute: 0.5 10*3/uL (ref 0.1–1.0)
Monocytes Relative: 5 % (ref 3–12)
Neutro Abs: 4.9 10*3/uL (ref 1.7–7.7)
Neutrophils Relative %: 51 % (ref 43–77)

## 2010-07-08 LAB — GLUCOSE, CAPILLARY: Glucose-Capillary: 91 mg/dL (ref 70–99)

## 2010-07-08 LAB — RAPID URINE DRUG SCREEN, HOSP PERFORMED
Amphetamines: NOT DETECTED
Barbiturates: NOT DETECTED
Benzodiazepines: POSITIVE — AB
Cocaine: NOT DETECTED
Opiates: NOT DETECTED
Tetrahydrocannabinol: POSITIVE — AB

## 2010-07-08 LAB — URINE MICROSCOPIC-ADD ON

## 2010-07-09 LAB — VALPROIC ACID LEVEL: Valproic Acid Lvl: <10 ug/mL — ABNORMAL LOW (ref 50.0–100.0)

## 2010-07-10 ENCOUNTER — Encounter: Payer: Self-pay | Admitting: Family

## 2010-07-10 ENCOUNTER — Ambulatory Visit (INDEPENDENT_AMBULATORY_CARE_PROVIDER_SITE_OTHER): Payer: 59 | Admitting: Family

## 2010-07-10 VITALS — BP 150/88 | HR 76 | Temp 98.1°F | Resp 18 | Wt 106.0 lb

## 2010-07-10 DIAGNOSIS — Z8669 Personal history of other diseases of the nervous system and sense organs: Secondary | ICD-10-CM | POA: Insufficient documentation

## 2010-07-10 DIAGNOSIS — Z87898 Personal history of other specified conditions: Secondary | ICD-10-CM | POA: Insufficient documentation

## 2010-07-10 DIAGNOSIS — G40909 Epilepsy, unspecified, not intractable, without status epilepticus: Secondary | ICD-10-CM

## 2010-07-10 MED ORDER — GABAPENTIN 300 MG PO CAPS: 300.0000 mg | ORAL_CAPSULE | Freq: Three times a day (TID) | ORAL | Status: DC

## 2010-07-10 NOTE — Assessment & Plan Note (Signed)
50 yr old female with hx of seizure disorder- took herself off of meds.  Now with recurrent seizures.  Will resume gabapentin and titrate upward to 300mg  PO tid.  Will also refer pt back to Dr. Hyacinth Meeker.  Pt was instructed not to drive.

## 2010-07-10 NOTE — Progress Notes (Signed)
  Subjective:    Patient ID: Ruth Bell, female    DOB: 11-10-1960, 50 y.o.   MRN: [id number]  HPI  Ms.  Bell is a 50 yr old female who presents today for ED follow up for seizure.   She was sitting in the bedroom talking to her son on Saturday night around 11PM.  She reports that her son witnessed this seizure.  He is not here today to provide details of the seizure. She reports that she had a total of 3 seizures.  She reports that her last seizure was approximately 1 year ago.  She has seen Dr.  Hyacinth Meeker in the past.  Pt discontinued her seizure medicine on her own about 1 yr ago.  She reports that she stopped her seizure medication due to somnolence.     Review of Systems See HPI  No past medical history on file.  History   Social History  . Marital Status: Married    Spouse Name: N/A    Number of Children: N/A  . Years of Education: N/A   Occupational History  . Not on file.   Social History Main Topics  . Smoking status: Current Everyday Smoker -- 35 years    Types: Cigarettes  . Smokeless tobacco: Not on file  . Alcohol Use: Not on file  . Drug Use: Not on file  . Sexually Active: Not on file   Other Topics Concern  . Not on file   Social History Narrative  . No narrative on file    No past surgical history on file.  No family history on file.  No Known Allergies  No current outpatient prescriptions on file prior to visit.    BP 150/88  Pulse 76  Temp(Src) 98.1 F (36.7 C) (Oral)  Resp 18  Wt 106 lb 0.6 oz (48.099 kg)       Objective:   Physical Exam  Constitutional:       Thin soft spoken AA female, awake, alert and in NAD  HENT:  Head: Normocephalic and atraumatic.  Eyes: Conjunctivae are normal. Pupils are equal, round, and reactive to light.  Cardiovascular: Normal rate and regular rhythm.   Pulmonary/Chest: Effort normal and breath sounds normal.  Abdominal: Soft. Bowel sounds are normal.  Skin: Skin is warm.  Psychiatric: She has a  normal mood and affect. Her behavior is normal.            Assessment & Plan:  ED records were reviewed.

## 2010-07-10 NOTE — Patient Instructions (Signed)
You will be contacted about your appointment with Dr. Hyacinth Meeker. No driving until you are cleared by Dr. Hyacinth Meeker.
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
## 2010-08-01 NOTE — Assessment & Plan Note (Signed)
[facility]                                 ON-CALL NOTE   Ruth Bell, Ruth Bell                       MRN:          [id number]  DATE:05/26/2008                            DOB:          11-Jan-1961    DATE OF CALL:  May 26, 2008, at 5:25 p.m.   PHONE NUMBER:  [phone].   REGULAR DOCTOR:  Barbette Hair. Artist Pais, DO   The patient said she saw Dr. Artist Pais a while back for some lower back pain.  She said it suddenly worsen today going down her left leg.  She feels a  little bit weak on that side.  Her leg gives away when the pain is too  severe.  She denies numbness and wants to know if she can get an  appointment with him earlier than planned.  I advised her if her  symptoms get worse tonight, if she develops numbness or increased  weakness in that leg to the emergency room for evaluation.  Otherwise,  to call Dr. Olegario Messier office when they start taking calls in the morning  around 8:30 to get followup with soon as possible.  I also advised her  to call back if she has any further questions.     [name], MD  Electronically Signed    MAT/MedQ  DD: 05/26/2008  DT: 05/27/2008  Job #: [id number]

## 2010-08-04 NOTE — H&P (Signed)
NAMEPAULINA, Ruth [name] NO.:  [id number]   MEDICAL RECORD NO.:  [id number]          PATIENT TYPE:  OUT   LOCATION:  XRAY                         FACILITY:  [facility]   PHYSICIAN:  Thomos Lemons, D.O. [facility]   DATE OF BIRTH:  [birth year]   DATE OF ADMISSION:  01/26/2004  DATE OF DISCHARGE:                                HISTORY & PHYSICAL   CHIEF COMPLAINT:  Nausea/abdominal pain.   HISTORY OF PRESENT ILLNESS:  The patient is a 50 year old African-American  female with complex medical history including anemia, arthritis, seizure  versus pseudoseizure, hypothyroidism, history of hepatitis B who comes to  see [name] today regarding recurrent persistent abdominal pain.  The patient was  evaluated approximately one month ago on December 21, 2003.  At that time the  patient had problems with dysphagia and weight loss and she was referred to  a gastroenterologist for evaluation.  She underwent a colonoscopy and  esophagogastroduodenoscopy.  The EGD may have been positive for H pylori as  she finished, it appears from her history, a course of triple therapy.  Her  colonoscopy found internal and external hemorrhoids and appendiceal stump  was found in the cecum but no other abnormalities were found.  The patient  comes to the office today with persistent pain and patient has also noticed  that she is passing clots from her vaginal area.  Dr. Russella Dar had requested a  pregnancy test which was done and also patient underwent laboratory  evaluation to work up her abdominal pain.  To date, the patient has a normal  white blood cell count and did not have any abnormalities in her basic  metabolic profile and her liver function tests are within normal limits.  Her amylase was 110.  Her pregnancy test was less than 0.5, negative.  In  addition, due to abdominal distention, she was sent for an abdominal series  to rule out any ileus or obstruction and the verbal report from the  radiologist noted that  there was a normal gas pattern.  The patient had a  tubal ligation approximately 20 years ago.  The patient states she has had  some intermittent vomiting yesterday and also today.  She has been able to  tolerate some clear liquids.   REVIEW OF SYMPTOMS:  No fevers or chills.  No HEENT symptoms.  No chest  pain, shortness of breath.  Gastrointestinal symptoms as noted above.  Genitourinary symptoms:  Patient says she has had problems over the past  couple of weeks with vaginal bleeding.  No musculoskeletal complaints and  all other systems are negative.   PAST MEDICAL HISTORY:  1.  History of anemia.  2.  Depression.  3.  History of peptic ulcer disease.  4.  History of seizure versus pseudoseizure.  5.  Patient had an appendectomy in 1987, also repair of her left rotator      cuff in 2003.   SOCIAL HISTORY:  She is married.  She is disabled from work.  She stopped  smoking and drinking alcohol approximately 2 years ago.   FAMILY HISTORY:  Remarkable for diabetes and hypertension.  Her father died  with prostate and lung cancer.   ALLERGIES:  No known drug allergies.   CURRENT MEDICATIONS:  1.  Celexa 5 mg once daily.  2.  Gabapentin 300 mg one t.i.d.  3.  Lorazepam 2 mg one-half tablet b.i.d.  4.  Nexium 40 mg once a day.   PHYSICAL EXAMINATION:  VITAL SIGNS:  Weight is 125 pounds.  Temperature  99.2, pulse 80, blood pressure 120/70.  GENERAL:  The patient is a 50 year old African-American female in mild  distress.  HEENT:  Benign. External auditory canals, tympanic membranes, oropharynx are  within normal limits.  NECK:  Examination did not reveal any adenopathy or thyromegaly.  RESPIRATORY:  Chest is clear to auscultation bilateral.  CARDIOVASCULAR:  Regular rate and rhythm without significant murmurs, rubs  or gallops appreciated.  ABDOMEN:  The patient's abdomen was somewhat distended, positive tympany.  Patient has mostly today suprapubic tenderness, however, on  previous  examination she also had epigastric and right lower quadrant tenderness.  GENITOURINARY:  Examination is deferred.  SKIN:  Warm and dry.   ASSESSMENT/PLAN:  1.  Refractory abdominal pain, no evidence of pregnancy, obstruction or      ileus.  No laboratory evidence to suggest cholecystitis or pancreatitis.      With history of vaginal bleeding, suspect a gynecologic source.  We will      admit the patient for further evaluation, obtain a CT scan of abdomen      and pelvis with and without contrast.  Other diagnostic possibilities      include nephrolithiasis or gastroenteritis.  The patient will  be given      pain medication, some intravenous hydration and we will consult OB-GYN      for inpatient evaluation.  Will also consult gastroenterology in the      [facility]  2.  Gastroesophageal reflux disease.  History of positive H Pylori.      Maintain patient on Nexium.  3.  History of pseudoseizure versus seizure.  Maintain the patient on      gabapentin.  4.  History of depression.  Maintain patient on Celexa.      Robe   RY/MEDQ  D:  01/26/2004  T:  01/26/2004  Job:  [id number]

## 2010-08-04 NOTE — Discharge Summary (Signed)
[place]. [facility]  Patient:    Ruth [name], Ruth [name] Visit Number: [id number] MRN: [id number]          Service Type: PSY Location: 400 0403 01 Attending Physician:  Jeanice Lim Dictated by:   Barbette Hair Vaughan Basta., M.D. Admit Date:  06/06/2001 Discharge Date: 06/09/2001                             Discharge Summary  DISCHARGE DIAGNOSES: 1. Generalized abdominal pain attributed to pelvic inflammatory disease    initially. 2. Anxiety manifested by tremor.  HISTORY OF PRESENT ILLNESS:  Ms. Wissink was a 50 year old female who was admitted to the hospital because of intractable abdominal pain.  She described the pain as being in her pelvis and particularly in her uterus.  She had had a variety of evaluations prior to admission including 24 hours of antibiotic therapy.  She had had outpatient wet prep, GC, Chlamydia screens, all of which were normal.  Essentially normal white count, although, she had a low grade fever 24 hours prior to admission.  Visually everything was completely normal, although, the patient was exquisitely tender of her adnexa and uterus.  The patient was admitted basically to try to control this pain because she was writhing in discomfort.  HOSPITAL COURSE:  IV Tequin was administered.  Gynecology consultation was obtained and they felt that we should continue on her antibiotic therapy. She had a CT and vaginal ultrasound which did not reveal any significant abnormality at the time of admission.  Of note, laboratories at the time of admission showed white count normal at 8000, hemoglobin normal at 12000, sed rate slightly elevated at 35.  Urinalysis was normal.  Blood cultures were normal.  Despite treatment and analgesic medications with Phenergan and Demerol, the patient continued to have fairly severe pain.  She describes as "not in her vagina."  She complains of extreme pelvic pain.  This is despite several days of treatment.   It was attributed to PID, however, white count and temperature were normal.  There was never any discharge.  There were no signs of any foreign bodies or abnormalities, otherwise.  We continued treatment to try to ambulate her.  Repeat CT was performed which did not reveal any meaningful information, maybe some slight thickening of her uterus.  Otherwise nothing abnormal.  Subsequently, a reconsult to gynecology on March 6.  They were unclear, as we were, as to what the etiology of her symptoms were.  They felt that also like to have general surgery see her.  General surgery and gynecology both consulted, ultimately doing a laparoscopy performed on March 7, which did not reveal any significant abnormality.  Cultures were obtained which were normal.  My suspicion subsequent to that was that the patients symptoms were augmented secondary to nonspecified psychiatric issues.  I continued to observe her and she seemed to be doing better with regard to her abdominal pain after surgery.  She markedly decreased her complaints with regard to that.  However, she started having shaking episodes which I felt were functional in nature.  She has had some tremor and I gave her some Xanax which did seem to help a little bit.  These clearlyed appeared to be pseudoseizures.  She and her family were calling them seizures.  MRI was obtained of her brain which was normal.  After one of these spells, immediately within five minutes, a prolactin level was  drawn which was normal. The decision was made to discharge her to home on March 12 with no specific diagnosis, but resolution of her abdominal pain.  Also, she had the shaking spells which clearly were not seizures by any nature.  DISCHARGE MEDICATIONS:  Ativan one pill every six to eight hours as needed for anxiety and shaking.  She had no restrictions on her activity or diet.  FOLLOW-UP:  She is to follow up with Dr. Merril Abbe one week after  discharge. Dictated by:   Barbette Hair Vaughan Basta., M.D. Attending Physician:  Jeanice Lim DD:  07/10/01 TD:  07/10/01 Job: [id number]

## 2010-08-04 NOTE — Consult Note (Signed)
[name], [name] NO.:  [id number]   MEDICAL RECORD NO.:  [id number]          PATIENT TYPE:  INP   LOCATION:  5023                         FACILITY:  [facility]   PHYSICIAN:  Guy Sandifer. Tomblin II, M.D.DATE OF BIRTH:  Jan 19, 1961   DATE OF CONSULTATION:  01/26/2004  DATE OF DISCHARGE:                                   CONSULTATION   REFERRING PHYSICIAN:  Dr. Thomos Lemons.   REASON FOR CONSULTATION:  This is a 50 year old married black female, G3,  P3, status post tubal ligation about 20 years ago.  She reports regular  menses for the last several years, although she would have occasional  menstrual flows lasting 1-4 weeks at a time.  She has had no pelvic  examination in the last 18-20 years.  Two weeks ago, she reports the onset  of a suprapubic and right flank pain with radiation to the lower back  bilaterally.  Pain is continuous, although is aggravated by bending or the  process of sitting up.  Sometimes when she walks, she has to hold to her  lower abdomen.  She sometimes curls up in a fetal position with the pain.  Over the last 2 months, she has had menstrual flow about every other week,  sometimes heavy with some clotting.  She has had abdominal swelling  beginning 1 week ago.  She complains of nausea and vomiting with almost all  food and even the smell of food for the past 1-2 weeks, has been unable to  retain any food lately.  She reports regular daily bowel movements, with her  last bowel movement this morning.  Normal bladder function.  Until recently,  she has been sexually active without dyspareunia.  Per conversation with Dr.  Artist Pais, the patient apparently has had a normal colonoscopy and endoscopy in  the recent past.  She is admitted to the hospital for further pain  management and diagnostic studies.   PAST MEDICAL HISTORY:  1.  Seizure disorder versus depression.  2.  Esophageal reflux disease.   PAST SURGICAL HISTORY:  1.  Appendectomy.  2.   Tubal ligation.   OBSTETRICAL HISTORY:  Vaginal delivery x3.   SOCIAL HISTORY:  The patient states that she was drinking heavily about 2  years ago, but denies tobacco or ethanol consumption in the past 2 years.   PHYSICAL EXAMINATION:  VITAL SIGNS:  Vital signs are stable.  She is  afebrile.  GENERAL:  The patient speaks in a strained voice and complains of some low  back pain.  LUNGS:  Lungs are clear to auscultation.  HEART:  Regular rate and rhythm.  BACK:  Back is without CVA tenderness, although she does have bilateral  lower back tenderness.  ABDOMEN:  The abdomen is soft but moderately distended.  Bowel sounds are  normal in all 4 quadrants.  She has mild tenderness without rebound on the  flanks, right greater than the left, and in the lower quadrants.  There is  no rebound tenderness and no palpable masses.  PELVIC:  Bimanual examination done in the patient's bed  reveals a uterus  that is mobile, but diffuse tenderness throughout the pelvis.  Exam is  somewhat compromised by abdominal distention.  There is a small amount of  dark red blood on the examining glove.   LABORATORIES:  On January 25, 2004, quantitative hCG is negative.  CMET is  within normal limits.  White count 5.6, hemoglobin 11.8, platelet count  382,000.   On January 06, 2004, abdominal and pelvic CTs are within normal limits.   ASSESSMENT:  1.  Abdominal pain and distention.  2.  Nausea and vomiting.  3.  Menometrorrhagia.   PLAN:  I agree with the pelvic ultrasound and abdominopelvic CT with  contrast that is currently pending.  We will follow up pending these  results.       JET/MEDQ  D:  01/26/2004  T:  01/27/2004  Job:  [id number]   cc:   Thomos Lemons, D.O. [facility]

## 2010-08-04 NOTE — Procedures (Signed)
HISTORY:  This is a 50 year old patient with a history of pseudoseizures  with further episodes of jerking __________ episodes prior to this  admission.  The patient is being evaluated once again for seizures.  This is  a portable EEG study.  No skull defects are noted.   MEDICATIONS:  Lorazepam, clonazepam and Neurontin.   ELECTROENCEPHALOGRAPHY CLASSIFICATION:  Normal awake.   DESCRIPTION:  According to the background rhythm, this recording consists of  a fairly well-modulated medium amplitude alpha rhythm of 10-11 Hz that is  reactive to eye opening and closure.  As the record progresses, the patient  appears to remain in the awakened state throughout the entirety of the  recording.  Photic stimulation and hyperventilation were not performed, but  there is excellent reactivity to eye opening and closure seen with the  background activities.  At no time during the recording does there appear to  be evidence of spikes, spike wave discharges or evidence of focal slowing.  The EKG monitor shows no evidence of cardiac arrhythmias with a heart rate  of 96.   IMPRESSION:  This is a normal EEG recording in the awakened state.  No  evidence of ictal or interictal discharges are seen.      UEA:[id number]  D:  12/08/2003 16:56:14  T:  12/09/2003 14:09:05  Job #:  [id number]

## 2010-08-04 NOTE — Discharge Summary (Signed)
[name], [name]   MEDICAL RECORD NO.:  [id number]          PATIENT TYPE:  INP   LOCATION:  0346                         FACILITY:  [facility]   PHYSICIAN:  Ike Bene, M.D. DATE OF BIRTH:  10/10/1960   DATE OF ADMISSION:  12/06/2003  DATE OF DISCHARGE:  12/10/2003                                 DISCHARGE SUMMARY   DISCHARGE DIAGNOSES:  1.  Pseudoseizures.  2.  Noncardiac chest pain.  3.  Psychiatric illness, not otherwise specified, with history of conversion      reaction.   HISTORY OF PRESENT ILLNESS:  Ruth [name] is very familiar to me and has a  longstanding history of significant psychiatric issues that she refuses to  acknowledge, and her family refuses to acknowledge.  She has a long history  of pseudoseizures and nonepileptic seizures.  She has had evaluation by two  neurologists at [facility] and neurologists at [facility], as well as  neurology at [facility].  Video EEGs have been obtained  and confirmed that she has evidence of syncope and seizures that are not  related to any organic brain issue.  These seizures appear to be  pseudoseizures.  It is not clear as to whether they are factitious or  related to her psychiatric illness.   The patient apparently came to the hospital because of chest pain, but that  seemed to be a minimal issue.  During the admission, she had multiple  episodes of jerking and twitching.  She developed right-sided weakness.  Neurology was consulted again for confirmation of her current  symptomatology.  They found that she had right-sided hemiparesis, right  hemisensory deficit, likely to psychogenic nature.  They felt that her  seizure was related to her previously documented pseudoseizures.  They felt  that she may have some migraine headaches and evidence of depression.  The  patient's laboratory and radiologic evaluations all were within normal  limits.  No evidence of any  identifiable organic process could be obtained.  We did a CT of her head as well as an MRI of her head which were also  completely normal.  She had a CT of her chest which showed no evidence of  pulmonary embolism to account for her pain.  Cardiac isoenzymes were  negative.  EKG was negative.   I had several discussions on different days with the patient's family  regarding medical management, disposition.  They were adamant that there was  some diagnosis or illness that has never been discovered  related to Ms.  Ruth [name].  At the day prior to discharge, I obtained a psychiatric consultation  which the patient and family refused, and basically escorted the  psychiatrist out of the room.  They were aware that I was going to consult  the psychiatrist prior to him coming.   At the time of discharge, the patient has no identifiable organic issue that  would require acute hospitalization.  I did feel that she had significant  psychiatric issues and needed psychiatric care which she and her family  adamantly refused.  They  felt that they wanted to go to [facility] for  another opinion on her current condition.  She was discharged to home in the  care of her family.  They were planning on taking her to [facility] for  further evaluation.  The patient and family refused to arrange for followup  appointment with me.   DISCHARGE MEDICATIONS:  She was discharged home on no __________  medications.   DISCHARGE CONDITION:  She was in a clinically stable situation.  There was  no suicidal or homicidal intent or ideation.      RM/MEDQ  D:  12/31/2003  T:  12/31/2003  Job:  [id number]

## 2010-08-04 NOTE — H&P (Signed)
[place]. [facility]  Patient:    Ruth [name], Ruth [name] Visit Number: [id number] MRN: [id number]          Service Type: MED Location: 3000 3006 01 Attending Physician:  Erich Montane Dictated by:   Genene Churn. Love, M.D. Admit Date:  06/04/2001   CC:         Barbette Hair. Vaughan Basta., M.D.   History and Physical  DATE OF BIRTH:  [birth year]  PATIENTS ADDRESS: [address] [place], [place]  REASON FOR ADMISSION:  This is the second [facility] admission for this 50 year old right-handed black married female from [place], [place], admitted from the emergency room for evaluation of non-epileptic seizures, tremors and headaches.  HISTORY OF PRESENT ILLNESS:  This patient has been in good health without a known prior history of seizures, head or neck trauma.  She developed lower abdominal pain in February of 2003 and was admitted to [facility] by Dr. Barbette Hair. Vaughan Basta., at which time she underwent laparoscopy with no definite abnormalities found.  She was placed on Levaquin at one point because of the question of PID pain.  CT scans were performed of the abdomen and of the pelvis at that time.  Portable chest x-ray showed no definite abnormality. An EKG showed normal sinus rhythm.  While in the hospital, she developed vague tremors which were thought to be non-epileptic seizures and she had prolactins drawn, which were normal, and an MRI study of the brain with and without contrast enhancement which was normal.  At home, she has continued to have these episodes occurring twice per day in which her eyes will roll back and she will have tremors of her arms lasting as long as 10 to 12 minutes.  She had one witnessed by myself in the emergency room where her eyes rolled back and she forcibly closed her eyes.  When I could see the pupils, they were reactive.  Her head shook side to side in an O fashion and her legs  shook from side to side.  An ammonia capsule stopped the episode.  Patient states that she has no warning when they are going to come on.  She notices that she can hear everything that is going on during the spells.  Her daughter reports that she has an older sister who was evaluated at the [place] [facility] [place] at [facility] with pseudoseizures.  Patient has been complaining of headache pain but her abdominal pain has improved.  PAST MEDICAL HISTORY:  Significant for a laparoscopy, May 23, 2001, left rotator cuff tear with injury in October of 2002.  HABITS:  She quit alcohol approximately 1 month ago, drinking 10 beers per month.  She quit cigarettes three weeks ago.  ALLERGIES:  She has a history of allergy to VICODIN.  MEDICATIONS: 1. Methadone 5 mg one b.i.d. 2. Lorazepam 5 mg one q.i.d. p.r.n. for anxiety. 3. Levaquin 500 mg q.d.  SOCIAL HISTORY:  She finished the ninth grade in school.  She lives with her husband.  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  FAMILY HISTORY:  Her mother died at 29.  Her father is 13, living and well. She has 5 brothers whose ages are 72 to 72 and 4 sisters, 35 to 17, all of whom are living and well.  PHYSICAL EXAMINATION:  GENERAL:  Physical examination revealed a well-developed, pleasant black female in no acute distress with a blood pressure in the right and left arms of 120/60 and 110/60, heart rate 64 and regular.  NECK:  There were no carotid or supraclavicular bruits heard.  Neck flexion and extension maneuvers were unremarkable.  NEUROLOGIC:  Mental status:  She was alert and oriented x3 and followed one-, two- and three-step commands.  Cranial nerve examination revealed visual fields to be full.  Extraocular movements were full, corneals were present and facial sensation was equal.  There was no facial motor asymmetry.  Hearing  was present, air conduction was greater than bone conduction.  Tongue was midline. The uvula was midline.  Gags were present.  Sternocleidomastoid and trapezius testing were normal.  Motor examination revealed 5/5 strength proximally and distally in the upper and lower extremities without any evidence of proximal pronator or distal drift.  Coordination testing revealed finger-to-nose and heel-to-shin and rapid alternating movements to be normal.  Sensory examination was intact to pinprick, light touch, joint position and vibration testing.  Deep tendon reflexes were 1 to 2+ and plantar responses were downgoing.  She had an outstretched hand and arm tremor.  IMPRESSION: 1. Suspect non-epileptic seizures, code 782.0. 2. Essential tremor, code 333.1. 3. History of left shoulder pain.  PLAN:  Plan at this time is to admit the patient for EEG and consider psychiatric consultation. Dictated by:   Genene Churn. Love, M.D. Attending Physician:  Erich Montane DD:  06/04/01 TD:  06/05/01 Job: [id number]

## 2010-08-04 NOTE — Consult Note (Signed)
NAMEHORTENCIA, MARTIRE NO.:  [id number]   MEDICAL RECORD NO.:  [id number]          PATIENT TYPE:  EMS   LOCATION:  ED                           FACILITY:  [facility]   PHYSICIAN:  Marlan Palau, M.D.  DATE OF BIRTH:  Aug 03, 1960   DATE OF CONSULTATION:  12/08/2003  DATE OF DISCHARGE:                                   CONSULTATION   HISTORY OF PRESENT ILLNESS:  Sharene Krikorian is a 50 year old right-handed  black female born on Sep 11, 1960, with a history of seizures and  pseudoseizures. This patient has come to [facility] for an  evaluation of syncopal events that have tended to occur while coughing or  sneezing. This has begun over the last couple of weeks. The patient has a  history of pseudoseizures followed by Dr. Sandria Manly. Pseudoseizures have been  documented at [facility] on video EEG monitoring.  The patient  also has a history of stroke in the past. During this admission she began  having jerking and twitching episodes around 3 to 4 a.m. today. The patient  was not treated with medications at that time. The patient has developed  right-sided weakness and numbness occurred since that time and for this  reason, neurology is asked to see this patient for further evaluation. The  patient has a strained, hoarse voice without true aphasia. CT of the head  done during this admission is unremarkable.   PAST MEDICAL HISTORY:  1.  Right hemiparesis, right hemisensory deficit that likely is psychogenic      in nature.  2.  Pseudoseizures documented in the past.  3.  History of left rotator cuff surgery.  4.  Depression.  5.  Possible migraine headaches.   CURRENT MEDICATIONS:  1.  Ativan 0.5 mg b.i.d.  2.  Amoxicillin 500 mg t.i.d.  3.  Klonopin 0.5 mg b.i.d.  4.  Neurontin 300 mg t.i.d.  5.  Tylenol if needed.   HABITS:  The patient does not smoke or drink.   ALLERGIES:  There are no known allergies.   SOCIAL HISTORY:  The patient is married.  She lives in the [place] area.  She is not working, is not on Disability. The patient has one daughter and  one son who are alive and well.   FAMILY MEDICAL HISTORY:  Notable that her mother died with an MI. Father  died with prostate and lung cancer. The patient has five brothers and four  sisters; two brothers have diabetes and one sister has diabetes.   REVIEW OF SYSTEMS:  Some pressure sensation along the right hip. The patient  notes chest pain, nausea, abdominal pain. The patient notes some change in  vision off to the right. Numbness and weakness on the right side.   PHYSICAL EXAMINATION:  VITAL SIGNS: Blood pressure 140/98, heart rate 92,  respiratory rate 20, temperature afebrile.  GENERAL: This patient is a very well-developed black female who is sleepy,  but can aroused at the time of examination.  HEENT: Head is atraumatic. Pupils equal, round, and reactive to light. Disks  are flat bilaterally.  NECK: Supple with no carotid bruits noted.  RESPIRATORY: Clear.  CARDIOVASCULAR: Regular rate and rhythm with no obvious murmurs or rubs  noted.  EXTREMITIES: Without significant edema.  NEUROLOGIC: Cranial nerves as above. Facial symmetry is notable that the  patient is pulling the left side of her mouth down, flattening out the right  side of her face. The patient notes total anesthesia to the right face,  splits the midline of forehead without gross sensation and notes normal  sensation, vibration, and pinprick sensation on the left side of the face.  The patient is seeing things only off to the left with double-simultaneous  stimulation. Speech is not aphasic. The patient has a high-pitched strained  hoarse voice. The patient is able to otherwise communicate normally.  Motor testing reveals a giveaway type weakness in the right arm and right  leg spreading to the left, normal strength is noted on the left.  Total  anesthesia to vibration and pinprick sensation is noted in  the right arm and  right leg as compared to the left. The patient has some ataxia with finger-  to-nose on the left arm, will not perform with the right. She is able to  perform toe-to-finger on the left, but will not perform on the right. The  patient has symmetric reflexes throughout. Toes are downgoing bilaterally.  The patient was not ambulated.   Laboratory values are notable for sodium of 137, potassium 3.6, chloride  103, CO2 27, glucose 99, BUN 7, creatinine 0.6, calcium 8.9, total protein  6.3, albumin 3.5, white count 7.3, hemoglobin 10.9, hematocrit 33.4, MCV  82.3, platelet count 239,000. Urine pregnancy test negative. UA reveals  specific gravity  of 1.025, pH 7.0; otherwise, unremarkable. INR 0.8.   CT of the head is unremarkable. EKG reveals normal sinus rhythm, prolonged  QT interval, heart rate 86.   IMPRESSION:  1.  Seizures/history of pseudoseizures.  2.  Right hemiparesis, right hemisensory deficit likely psychogenic in      nature.  3.  History of depression.   This patient's clinical manifestations are dominated by psychiatric disease.  This patient has well documented pseudoseizures and has an pseudostroke  with psychogenic features suggesting behavior at this point. Will proceed  with further workup to confirm suspicions of psychiatric right-sided  weakness and seizures.   PLAN:  1.  MRI of the brain.  2.  EEG study.  3.  Will follow the patient's clinical course while in house. Would not      aggressively treat this patient's seizures.      CKW/MEDQ  D:  12/08/2003  T:  12/08/2003  Job:  [id number]   cc:   Ike Bene, M.D.  [address]  [place]  [place] [address]  Fax: [phone]   [facility]

## 2010-08-04 NOTE — H&P (Signed)
. [facility]  Patient:    Ruth [name], PHARRIS Visit Number: [id number] MRN: [id number]          Service Type: MED Location: [phone] Attending Physician:  Jerl Santos Dictated by:   Jerl Santos, M.D. Admit Date:  05/14/2001   CC:         Dr. Benedetto Goad   History and Physical  HISTORY OF PRESENT ILLNESS:  Crystalann Korf is a 50 year old lady who initially presented to Dr. Benedetto Goad on May 13, 2001 with a two-day history of lower abdominal pain associated with a feeling of fullness in the pelvic area, low grade temperature, and anorexia.  He very thoroughly evaluated the problem including performing a routine abdominal exam, a pelvic examination, and an abdominal ultrasound.  He also did extensive laboratory studies including a CBC, urinalysis, and urine pregnancy test.  These tests were all unremarkable with the exception of the white count which was elevated and the pelvic exam which showed marked tenderness on manipulation of the uterus.  Dr. Benedetto Goad concluded that the patient had pelvic inflammatory disease and placed her on Levaquin 500 mg daily.  Unfortunately, she has continued to experience severe pain.  She states she is unable to eat or drink.  She does not feel that she will be able to function in the outpatient setting.  For this reason, I am going to admit her at this time.  PAST SURGICAL HISTORY:  The patient has had no previous operations.  PAST MEDICAL HISTORY:  She was diagnosed with a rotator cuff tear of the left shoulder and has received physical therapy for this problem.  CURRENT MEDICATIONS:  None with the exception of the above-mentioned Levaquin.  ALLERGIES:  There are no known drug allergies.  SOCIAL HISTORY:  The patient is married.  She smokes about four cigarettes a day.  She denies the use of alcohol or drugs.  FAMILY HISTORY:  Remarkable for the patients mother who is deceased at age 67 of  uncertain explanation.  The patients father is alive at 67 and has no obvious medical problems.  Her sister has hypertension.  Her children are in good health.  One thing that she pointed out to Dr. Benedetto Goad in a visit of February 03, 2001 was that she had not had a Pap smear for six years previously.  He performed a Pap smear at that time which was within normal limits.  REVIEW OF SYSTEMS:  HEAD:  She denies headache or dizziness.  EYES:  She denies visual blurring or diplopia.  NOSE/THROAT:  Denies earaches, sinus pain, or sore throat.  CHEST:  Denies coughing, wheezing, or chest congestion. CARDIOVASCULAR:  Denies orthopnea, PND, or ankle edema.  GASTROINTESTINAL: Denies nausea, vomiting, abdominal pain, change in bowel habits, melena, or hematochezia.  GENITOURINARY:  She denies dysuria, urinary frequency, hesitancy, or nocturia.  RHEUMATOLOGIC:  Denies back pain or joint pain. HEMATOLOGIC:  Denies easy bleeding or bruising.  NEUROLOGIC:  Denies seizure or stroke.  PHYSICAL EXAMINATION:  HEENT:  Within normal limits.  CHEST:  Clear.  CARDIOVASCULAR:  Normal S1 and S2 without murmurs, rubs, or gallops.  ABDOMEN:  Quite benign.  There were hyperactive bowel sounds.  There was no guarding or rebound.  No masses were appreciated.  RECTAL:  Absolutely normal.  There was no perirectal tenderness, no abscesses, and no masses.  PELVIC:  The speculum exam revealed that the patient had a clear discharge from the cervix, although she had had a  previous GC and chlamydia study done, I repeated this.  Adnexal area was absolutely normal.  There did not seem to be any masses.  There did not seem to be any tenderness.  However, with manipulation of the uterus, the patient experienced a lot of discomfort.  LABORATORY DATA:  As previously mentioned, the patient has had both an a CT ultrasound and a CT scan of the pelvis and these studies were negative for significant pathology.  IMPRESSION:   I believe this patient has pelvic inflammatory disease but outpatient management has not proved to be very successful.  For this reason, I am going to admit her to the hospital, administer intravenous fluids.  We will place her on a combination of Tequin 400 mg IV q.d. and Flagyl 500 mg daily.  Pain medicine is ordered for her to take as needed.  I will involve the gynecologist in her management.  We will follow her closely.Dictated by: Fanny Dance, M.D. Attending Physician:  Jerl Santos DD:  05/14/01 TD:  05/14/01 Job: [id number]

## 2010-08-04 NOTE — Discharge Summary (Signed)
NAMEETHELDA, DEANGELO                ACCOUNT NO.:  [id number]   MEDICAL RECORD NO.:  [id number]          PATIENT TYPE:  INP   LOCATION:  9309                          FACILITY:  [facility]   PHYSICIAN:  Guy Sandifer. Tomblin II, M.D.DATE OF BIRTH:  September 06, 1960   DATE OF ADMISSION:  04/06/2004  DATE OF DISCHARGE:                                 DISCHARGE SUMMARY   ADMITTING DIAGNOSES:  1.  Uterine leiomyomata.  2.  Menometrorrhagia.   DISCHARGE DIAGNOSES:  1.  Leiomyomata.  2.  Menometrorrhagia.  3.  Endometriosis.   PROCEDURE:  On April 06, 2004 - laparoscopically-assisted vaginal  hysterectomy with bilateral salpingo-oophorectomy and lysis of adhesions.   REASON FOR ADMISSION:  This patient is a 50 year old black female G3 P3  status post tubal ligation with menometrorrhagia.  Details are dictated in  the History and Physical.  She is admitted for surgical management.   HOSPITAL COURSE:  The patient is taken to the operating room, undergoes the  above procedure.  On the evening of surgery she is hungry, passing flatus,  with good pain relief.  She is afebrile with stable vital signs.  Urine  output is clear.  Abdomen is mildly distended with good bowel sounds.  On  postoperative day #1 she was noted to have had a bowel movement with a  Dulcolax suppository, was continuing to pass flatus.  There was no nausea or  vomiting.  She was having some headache.  She remained afebrile with stable  vital signs.  Abdomen was soft but still mildly distended with good bowel  sounds.  White count is 8.8 and hemoglobin is 9.7.  Her PCA was  discontinued, IV fluids were continued.  She was given Ativan and a regular  diet.  On the day of discharge she is ambulating well, tolerating regular  diet, continuing to have bowel movements, and feeling better.  Vital signs  are stable and she is afebrile.  Abdomen is soft with good bowel sounds.  Pathology is pending.   CONDITION ON DISCHARGE:  Good.   DIET:   Regular as tolerated.   ACTIVITY:  No lifting, no operation of automobiles, no vaginal entry.  She  is to call the office for problems including but not limited to temperature  of 101 degrees, heavy vaginal bleeding, persistent nausea or vomiting.   MEDICATIONS:  1.  Percocet 5/325 mg #30 one to two p.o. q.6h. p.r.n.  2.  Hemocyte #30 one p.o. daily.  3.  Multivitamin daily.  4.  Stool softener as needed.  5.  She was to resume other medications that she was previously taking.   Follow-up is in the office in 2 weeks.      JET/MEDQ  D:  04/08/2004  T:  04/08/2004  Job:  [id number]   cc:   Thomos Lemons, D.O. [facility]

## 2010-08-04 NOTE — Op Note (Signed)
[place]  Patient:    Ruth [name], Ruth [name] Visit Number: [id number] MRN: [id number]          Service Type: MED Location: [phone] Attending Physician:  Jerl Santos Dictated by:   Ronda Fairly. Galen Daft, M.D. Proc. Date: 05/23/01 Admit Date:  05/14/2001   CC:         Velora Heckler, M.D.  Barbette Hair. Vaughan Basta., M.D.   Operative Report  PREOPERATIVE DIAGNOSES: 1. Pelvic pain. 2. Generalized abdominal pain with acute tenderness.  POSTOPERATIVE DIAGNOSES: 1. Pelvic pain. 2. Generalized abdominal pain with acute tenderness.  PROCEDURE:  Diagnostic laparoscopy with aspiration of cyst fluid.  SURGEON:  Ronda Fairly. Galen Daft, M.D.  INTRAOPERATIVE CONSULTATION:  Dr. Gerrit Friends.  COMPLICATIONS:  None.  ESTIMATED BLOOD LOSS:  Less than 5 cc.  ANESTHESIA:  General.  INDICATIONS:  The patient was identified as Ruth [name].  We informed consent prior to bringing her to the operating room.  She had a one-week stay of hospital course which was performed for lower abdominal pain, nonspecific. She was treated with pelvic inflammatory disease as the initial diagnosis, but failed to improve after intravenous and oral antibiotics.  Cultures for gonorrhea and chlamydia both came back negative.  She was brought to the operating room for further diagnosis when her pain failed to improve.  Prior to bringing her to the operating room both on May 22, 2001, and in the holding area the morning prior to surgery, she had abdominal distention with unclear etiology.  The abdomen was somewhat tympanitic and tender to palpation.  However, under anesthesia, it was interesting that the abdomen had flattened out considerably as if nasogastric tube had been placed.  I confirmed with anesthesiology that this had not actually been performed after induction of anesthesia.  DESCRIPTION OF PROCEDURE:  She had an indwelling Foley catheter, Betadine prep with sterile  technique.  Both the vagina and abdomen had been prepped.  The cone cannula was utilized for uterine manipulation with a single-tooth tenaculum.  The cervix was readily identified and also noted to have about a second-degree descensus at the beginning of the procedure.  The Veress needle was utilized for abdominal inflation and the abdomen was inflated with carbon dioxide gas.  Care was taken to ascertain that placement was correct.  Next, a 10 mm Trocar was placed in the umbilical site and this was done atraumatically.  The inspection revealed Trocar placement was correct without injury to internal organs.  The bowel surfaces appeared normal.  There was no sign of any exudate or discharge.  There was slight serous fluid in the posterior cul-de-sac which was consistent with normal physiologic changes. This was aspirated and sent off to pathology.  The right and left ovary were inspected and found to be normal.  There was no evidence of endometriosis. The uterus itself was slight enlarged and boggy consistent with probable adenomyosis, but otherwise unremarkable.  There was small, less than 0.5 cm size fibroids on the serosal surface.  These were incidental findings.  Also incidental findings on both tubes, there are peritubal cysts consistent with cysts of Morgagni, otherwise unremarkable.  The pelvic side walls were negative and free of disease.  Posterior cul-de-sac and anterior cul-de-sac similarly free of disease.  The appendix site was evaluated.  There had been a prior appendectomy.  There was a staple at the cecum and this was normal appearing without any sign of leakage or any problem.  Up in the  upper abdomen, around the liver, there was no evidence of Fitz-Hugh and Curtis syndrome or any other abnormality and no adhesions.  There was a normal appearing gallbladder surface.  The upper stomach again appeared normal without any evidence of lesions or adhesions.  There were no  adhesions to the anterior abdominal wall.  The procedure was then felt to be complete. Inspection of all organs was complete without any evidence of any inflammation or other signs or source of abdominal pain.  The abdomen was deflated and the instrument was removed from the abdomen.  There was a secondary puncture site which was done under direct visualization in the left lower quadrant for uterine manipulation and organ identification.  This site was also removed under direct visualization and there was no bleeding from either site.  The skin was closed with #4-0 Monocryl subcuticular and the instruments were removed from the vagina.  All instrument and sponge counts were correct at the end of the case as they were throughout the case.  The Foley catheter was discontinued after she left the operating room and she left in stable condition. Dictated by:   Ronda Fairly. Galen Daft, M.D. Attending Physician:  Jerl Santos DD:  05/23/01 TD:  05/26/01 Job: [id number]

## 2010-08-04 NOTE — Discharge Summary (Signed)
[place]  Patient:    Ruth [name], Ruth [name] Visit Number: [id number] MRN: [id number]          Service Type: PSY Location: [phone] Attending Physician:  Jeanice Lim Dictated by:   Genene Churn. Love, M.D. Admit Date:  06/06/2001 Disc. Date: 06/06/01   CC:         Adelene Amas. Williford, M.D.  Barbette Hair. Vaughan Basta., M.D.   Discharge Summary  PATIENT ADDRESS:  Adrienne Trombetta. [place], [place] [address] [place], [place] [address].  DATE OF BIRTH: November 19, 1960.  HISTORY OF PRESENT ILLNESS:  This was the second [facility] admission for this 50 year old right handed black married female from [place], [place] admitted from the emergency department for evaluation of nonepileptic seizures, tremors and headaches.  The patient has been in good health she states her entire life without any known history of seizures, head or neck trauma, but developed lower abdominal pain in February 2003, was admitted to [facility] by Dr. Barbette Hair. MacArthur at which time she underwent an extensive evaluation including a diagnostic laparoscopy with no definite abnormalities found.  She was treated with Levaquin because of the question of PID pain but was never diagnosed as actually having PID.  Other studies included a CT scan of the abdomen and pelvis which were normal and a portable chest x-ray which showed no definite abnormalities and electrocardiogram which was normal.  While in the hospital the patient developed tremors and complained of the tremors which were evaluated with a magnetic resonance imaging study of the brain with and without contrast enhancement and also with a prolactin level which was unremarkable.  It was felt that she was having forms of nonepileptic seizures by Dr. Merril Abbe.  At home the patient continued to have these following discharge and came to the emergency department the evening of 06/04/01.  One of these episodes was  witnessed by myself as she went into the CT scan at which time her eyes would roll back, she would forcibly close her eyes and she would have side to side movements of her lower extremities.  This responded well to ammonia capsule.  PAST MEDICAL HISTORY: Her past history is significant for laparoscopy in March 2003.  She has had a left rotator cuff tear with injury in October of 2001. She has had depression and has been admitted to the [facility] times one.  She states that she quit alcohol approximately one month ago and had been drinking 10 beers per month according to her comments to me and quit cigarettes three weeks ago, but apparently she has been a heavy alcohol user in the past related to depressive circumstances concerning the death of her mother.  CURRENT MEDICATIONS AT ADMISSION: 1. Methadone 5 mg b.i.d. 2. Lorazepam 5 mg q.i.d. for anxiety. 3. Levaquin 500 mg q.d.  SOCIAL HISTORY:  Patient finished the 9th grade in school.  She lives with her husband.  PHYSICAL EXAMINATION:   Her examination at the time of admission was unremarkable.  LABORATORY DATA: CT SCAN OF BRAIN showing no definite abnormalities without contrast enhancement.  ELECTROENCEPHALOGRAM:  Raised the question of some left frontotemporal slowing but a review by myself revealed no definite slowing that I could see.  CBC:  White blood cell count was 6,200, hemoglobin 12.3 and hematocrit 36.9. Platelet count was 341K.  Differential was 58% polys, 29% lymphs, 5% monocytes, 7% eosinophils and 1% basophils.  DRUG SCREEN:  A drug  screen was unremarkable.  COMPREHENSIVE METABOLIC PANEL:  On 06/04/01 was also unremarkable.  SED RATE:  15.  HOSPITAL COURSE:  The patient was admitted to the 3000 floor and complained of headache and had some nausea and vomiting which was treated with Reglan and Ultram for her headache pain.  Her sed rate turned out to be normal.  She had some tremors in  the hospital but none of the nonepileptic seizures.  She was seen in consultation by Dr. Jeanie Sewer who obtained a history of previous depressive symptoms and two suicide attempts with an admission to [facility] and the use of Prozac in the past.  It was decided that the should obtain further psychiatric treatment and she voluntarily committed to the [facility].  IMPRESSION 1. Nonepileptic seizures, 782.0. 2. Central tremor, 333.1. 3. History of left shoulder pain. 4. Depression 311.0  PLAN:  [facility] at [facility] Dictated by:   Genene Churn. Love, M.D. Attending Physician:  Jeanice Lim DD:  06/06/01 TD:  06/07/01 Job: [id number]

## 2010-08-04 NOTE — Op Note (Signed)
Ruth [name], HANLINE NO.:  [id number]   MEDICAL RECORD NO.:  [id number]          PATIENT TYPE:  AMB   LOCATION:  [facility]                         FACILITY:  [facility]   PHYSICIAN:  Jamison Neighbor, M.D.  DATE OF BIRTH:  Dec 02, 1960   DATE OF PROCEDURE:  02/20/2005  DATE OF DISCHARGE:                                 OPERATIVE REPORT   SERVICE:  Urology.   PREOPERATIVE DIAGNOSES:  Stress urinary incontinence.   POSTOPERATIVE DIAGNOSES:  Stress urinary incontinence.   PROCEDURE:  Cystoscopy and transobturator sling.   SURGEON:  Jamison Neighbor, M.D.   ANESTHESIA:  General.   COMPLICATIONS:  None.   DRAINS:  None.   BRIEF HISTORY:  This 50 year old female has urinary incontinence. In the  past, the patient had a question of possible urgency incontinence but  urodynamic studies indicate that she primarily has stress incontinence. The  patient has previously been thought to have possible interstitial cystitis  but she had an unremarkable potassium test and she has had unremarkable  cystoscopy. The patient is to undergo placement of a transvaginal sling. She  understands that there is no effect on this in terms of urgency type  incontinence and is strictly therefore stress incontinence purposes. She  gave full informed consent for the procedure.   DESCRIPTION OF PROCEDURE:  After successful induction of general anesthesia,  the patient was placed in the dorsal lithotomy position, prepped with  Betadine and draped in the usual sterile fashion. Careful bimanual  examination showed she really has very little in the way of cystocele,  rectocele or enterocele but does have some degree of urethral mobility. A  midline incision was made directly below the urethra in the mid urethral  location after the area had been infiltrated with local anesthetic. A flap  was raised bilaterally allowing the finger to be placed into the incision  back to but not through the endopelvic  fascia. Two separate incisions were  then made in the groin crease at the approximate level of the clitoris. The  curved needle was then passed from the groin incisions to the vaginal  incision through the obturator fossa. Cystoscopy was performed with 7 degree  and 12 degree lenses. No injury to the bladder could be seen. There was no  evidence of the needle going through the posterior fornix of the vagina. The  sling was then placed and was pulled out through the groin incisions  bilaterally. The tensioning was set by placing an instrument between the  urethra and the sling. The protective coating was then cut and removed.  Cystoscopic examination once again was performed and it showed no evidence  of any injury to the bladder and showed nice support for the urethra but not  over correction. With the full bladder, there was no leak but with the  coude' there was a normal flow. The area was irrigated carefully, the  incision was closed with 2-0 Vicryl, the patient had packing placed. She had  the two wounds in the groin closed with Dermabond. She tolerated the  procedure well and was  taken to the recovery room in good condition. She was given a prescription  for pain medication as well as antibiotic coverage and will return to see me  in 2-3 weeks. She will need to pass a voiding trial prior to discharge but  if she does she will go home with no catheter.           ______________________________  Jamison Neighbor, M.D.  Electronically Signed     RJE/MEDQ  D:  02/20/2005  T:  02/20/2005  Job:  [id number]

## 2010-08-04 NOTE — H&P (Signed)
Ruth [name], PILLARD NO.:  [id number]   MEDICAL RECORD NO.:  [id number]           PATIENT TYPE:   LOCATION:                                 FACILITY:   PHYSICIAN:  Guy Sandifer. Arleta Creek, M.D.   DATE OF BIRTH:   DATE OF ADMISSION:  04/06/2004  DATE OF DISCHARGE:                                HISTORY & PHYSICAL   CHIEF COMPLAINT:  Irregular heavy vaginal bleeding.   HISTORY OF PRESENT ILLNESS:  This patient is a 50 year old married black  female, G3, P3, status post tubal ligation, who was originally seen by me in  the hospital upon consultation in November of 2005.  At that time, she was  admitted for some complaints of abdominopelvic pain and irregular menstrual  bleeding, sometimes heavy.  According to Dr. Artist Pais, she had recently had a  normal colonoscopy and endoscopy.  A pelvic ultrasound on January 26, 2004  revealed a uterine leiomyomata at 2.5 cm in greatest dimension, and a 4.1 cm  complex cyst in the right adnexa.  On January 27, 2004, an abdominopelvic  CT revealed an unremarkable abdomen with a stable right renal cyst.  The  pelvis revealed a 4 cm right adnexal mass.  She was subsequently discharged  home, and was noted to continue to complain of heavy irregular bleeding, as  well as suprapubic and mid-abdominal pain and intermittent abdominal  bloating.  Consultation with Dr. Marcelyn Bruins was obtained.  He believes  that many of her symptoms are possibly consistent with an interstitial  cystitis, and that work-up is ongoing.  A Pap smear on January 31, 2004 is  benign.  Repeat ultrasound on March 30, 2004 revealed the uterus measuring  6.8 x 4.1 x 5.5 cm.  A 3.1 cm mass consistent with a leiomyoma was noted.  The right ovarian cyst is resolved.  Finally, preoperative medical clearance  was obtained with Dr. Artist Pais, who could see no medical contraindication to  surgery.  Finally, on April 04, 2004, she was at [facility] for her  preoperative  evaluation when she complained of the sudden onset of  abdominopelvic pain.  Evaluation at that time revealed stable vital signs.  She was afebrile.  Urinalysis was negative.  White count 7.7 and hemoglobin  of 13.3 were note.  Repeat pelvic ultrasound was unchanged.  After carefully  discussing all of the above with the patient and her daughter, she is being  admitted for laparoscopically-assisted vaginal hysterectomy and bilateral  salpingo-oophorectomy.  The risks of surgery have been carefully discussed  preoperatively.  The pros and cons of ovarian preservation versus removal of  both ovaries have been reviewed.  Issues of menopause have been reviewed.  The fact that her abdominopelvic pain could continue following this surgery  was also carefully discussed.  The patient and her daughter acknowledged  understanding.   PAST MEDICAL HISTORY:  1.  Occasional constipation.  2.  Abnormal uterine bleeding, as above.  3.  Seizure disorder versus pseudoseizure disorder.  According to Dr. Artist Pais, a      video electroencephalogram at [facility]  [facility] was consistent with      pseudoseizure activity.  4.  Hepatitis B.   PAST SURGICAL HISTORY:  1.  Laparoscopy.  2.  Appendectomy.   MEDICATIONS:  1.  Gabapentin 300 mg t.i.d.  2.  Lorazepam 20 mg one-half tablet b.i.d.  3.  Nexium 40 mg once a day.  4.  Celexa 5 mg once a day.   ALLERGIES:  DURONTIN causes hives.   OBSTETRIC HISTORY:  Vaginal delivery x2.   FAMILY HISTORY:  Diabetes in sister and brother.  Cervical cancer in sister.  Hypertension in siblings.  Asthma in a son.  Renal disease in sister.  Endocrine disorder in sister.   SOCIAL HISTORY:  The patient smoked a pack a day for 20 years, although she  quit 2-3 years ago.  Denies alcohol or drug abuse.   REVIEW OF SYSTEMS:  NEUROLOGIC:  Question of seizure disorder, as above.  CARDIOVASCULAR:  Denies chest pain.  PULMONARY:  Denies shortness of breath.  GI:  Denies nausea, vomiting.   Has occasional constipation, as above.   PHYSICAL EXAMINATION:  VITAL SIGNS:  Blood pressure 116/76.  HEENT:  Without thyromegaly.  LUNGS:  Clear to auscultation.  HEART:  Regular rate and rhythm.  BACK:  Without CVA tenderness.  BREASTS:  Not examined.  ABDOMEN:  Soft, nontender, without masses.  PELVIC:  Vulva, vagina, and cervix without lesion.  Uterus is upper limits  of normal size with a probable 2-3 cm uterine leiomyoma.  Adnexa nontender  without masses.  EXTREMITIES:  Neurologic exam grossly within normal limits.   ASSESSMENT:  Uterine leiomyomata and menometrorrhagia.   PLAN:  Laparoscopically-assisted vaginal hysterectomy and bilateral salpingo-  oophorectomy.      JET/MEDQ  D:  04/05/2004  T:  04/05/2004  Job:  [id number]

## 2010-08-04 NOTE — Discharge Summary (Signed)
[facility]  Patient:    Ruth [name], Ruth [name] Visit Number: [id number] MRN: [id number]          Service Type: PSY Location: [id number] [id number] Attending Physician:  Jeanice Lim Dictated by:   Jeanice Lim, M.D. Admit Date:  06/06/2001 Discharge Date: 06/09/2001                             Discharge Summary  IDENTIFYING DATA:  This is a 50 year old married African-American female voluntarily admitted for depression with two recent hospitalizations, one for seizure like episode and one for pelvic inflammatory disease.  Feeling depressed, worthless, and unable to function.  ADMISSION MEDICATIONS:  None.  ALLERGIES:  VICODIN.  PHYSICAL EXAMINATION:  GENERAL:  Essentially within normal limits.  NEUROLOGIC:  Nonfocal.  Reporting a history of seizures on Wednesday and Friday.  ROUTINE ADMISSION LABORATORY DATA:  Essentially within normal limits including a negative urine drug screen and alcohol level less than 5.  MENTAL STATUS EXAMINATION:  Alert, middle-aged African-American female, casually dressed, cooperative.  Speech: Within normal limits.  Mood: Sad. Affect: Restricted.  Thought process: Goal directed.  Negative for dangerous ideation or psychotic symptoms.  Cognitive: Intact.  Judgment and insight: Fair.  ADMITTING DIAGNOSES: Axis I:    1. Major depression, recurrent, severe.            2. History of alcohol dependence in early remission.            3. Possible pseudoseizures. Axis II:   None. Axis III:  Questionable seizure activity. Axis IV:   Moderate problems with occupation, economic problems, and primary            support system. Axis V:    30/65  HOSPITAL COURSE:  The patient was admitted and ordered routine p.r.n. medications, restarted on medicines, monitored for withdrawal symptoms, given Librium p.r.n., Levaquin, and reported positive response to clinical intervention.  CONDITION AT DISCHARGE:  Markedly improved.  Mood:  More euthymic.  Affect: Brighter.  Thought process: Goal directed.  Thought content: Negative for dangerous ideation or psychotic symptoms.  DISCHARGE MEDICATIONS:  Celexa 20 mg q.a.m.  FOLLOWUP:  [facility] with Abel Presto and Dr. Lourdes Sledge on April 1 at 2 p.m. and Abel Presto on March 27 at 1 p.m.  DISCHARGE DIAGNOSES: Axis I:    1. Major depression, recurrent, severe.            2. History of alcohol dependence in early remission.            3. Possible pseudoseizures. Axis II:   None. Axis III:  Questionable seizure activity. Axis IV:   Moderate problems with occupation, economic problems, and primary            support system. Axis V:    Global assessment of functioning on discharge was 55. Dictated by:   Jeanice Lim, M.D. Attending Physician:  Jeanice Lim DD:  07/16/01 TD:  07/16/01 Job: [id number]

## 2010-08-04 NOTE — H&P (Signed)
NAMETAMY, ACCARDO                          ACCOUNT NO.:  [id number]   MEDICAL RECORD NO.:  [id number]                   PATIENT TYPE:  INP   LOCATION:  0346                                 FACILITY:  [facility]   PHYSICIAN:  Hal T. Stoneking, M.D.              DATE OF BIRTH:  10-10-1960   DATE OF ADMISSION:  12/06/2003  DATE OF DISCHARGE:                                HISTORY & PHYSICAL   IDENTIFYING DATA:  Ruth [name] is a 50 year old black female.   PROBLEM #1 - SPELLS FOR 3 YEARS:  Ruth [name] has had some type of seizure-  like episodes followed by unresponsiveness or syncope.  She has been  evaluated in the past by Dr. Genene Churn. Love, more recently by a neurologist  in [facility], a Dr. Hyacinth Meeker.  There is the possibility that these may have  been or are pseudoseizures.  More recently, she has been evaluated for  cervical disk disease; about 2 weeks ago, she had an MRI to evaluate right  shoulder and chest pain.  She had an MRI of the cervical spine which did  indeed reveal a C5-C6 disk however, during this time, she started having  more and more episodes where she will jerk all over for 4 or 5 seconds and  then become unresponsive, and then slowly come back to consciousness,  sometimes being a little bit confused.  She had 1 of these episodes in the  emergency room, or actually has had a number of them witnessed by the ER  staff and myself.  She again had some jerking while she was lying on the  gurney, then became somewhat unresponsive and then she would awaken and seem  somewhat confused, and then at one point, just became limp and unresponsive  again.  At one point, the ER physician was trying to look in her eyes and it  appeared volitionally that she was not allowing her lids to be opened.  When  she was alert, she was able to tell me her name, her age and could follow  some simple commands.  Family obviously is not able to take care of her in  this condition.  They deny that she  has used any drugs or alcohol and did  drink in the past, but none in the last 2-3 years.   PAST MEDICAL HISTORY:  She has no history of diabetes, cancer, coronary  artery disease or hypertension.   ALLERGIES:  She has no known drug allergies.   CURRENT MEDICATIONS:  1.  Lorazepam 0.5 mg b.i.d.  2.  Amoxicillin 500 mg t.i.d., recently started for a sinus infection.  3.  Clonazepam 0.5 mg b.i.d.  4.  Gabapentin 300 mg t.i.d.   PREVIOUS SURGERY:  She had repair of a left rotator cuff injury by Dr. Burnard Bunting.   SOCIAL HISTORY:  She is married.  She is disabled from  work.  She stopped  smoking and drinking alcohol about 2 years ago.   FAMILY HISTORY:  Family history is remarkable for diabetes and hypertension.  Her father died with prostate and lung cancer.   REVIEW OF SYSTEMS:  Review of systems essentially unobtainable, although the  only complaint she has of pain is in her right upper arm and chest wall.   PHYSICAL EXAM:  VITAL SIGNS:  Temperature 97.4, pulse 92, blood pressure  140/98, respiratory rate 20.  SKIN:  Skin normal.  HEENT:  Pupils equal, round and reactive to light.  Extraocular muscles  intact.  Disks are sharp.  TMs clear.  Oropharynx clear.  LUNGS:  Clear.  HEART:  Regular rate and rhythm without murmurs, rubs, or gallops.  ABDOMEN:  Abdomen soft.  No hepatosplenomegaly or masses palpated.  NEUROLOGIC:  She periodically follows commands in between these spells.  She  knows her age.  Motor exam:  Strength 5/5.  Deep tendon reflexes 2+,  symmetrical.  Toes are downgoing.   LABORATORY DATA:  EKG revealed no acute change.   Sodium 137, potassium 3.6, chloride 103, bicarb 27, glucose 99, BUN  7,  creatinine 0.6, calcium 8.9, total protein 6.3, albumin 3.5.  Fibrin  derivatives 0.48, which is normal.  Pro time INR 0.8.  White count 7300,  hemoglobin 10.9 and platelet count 329,000.  Pregnancy test is negative.  Urinalysis normal.   CT scan of the chest is  normal.   ASSESSMENT:  1.  Unusual episodes, ? pseudoseizure or some type of conversion reaction.  2.  Cervical disk disease.  3.  Musculoskeletal chest wall pain.   PLAN:  Will admit.  Will check a prolactin level.  Will obtain neurology  consult.  If neurologists confirm that this is pseudoseizure or some type of  psychiatric process, we will then obtain a psychiatric consultation,  followup of her cervical disk disease when these episodes have been fully  evaluated.      HTS/MEDQ  D:  12/07/2003  T:  12/07/2003  Job:  [id number]

## 2010-08-04 NOTE — Op Note (Signed)
NAMECARNETTA, LOSADA                ACCOUNT NO.:  [id number]   MEDICAL RECORD NO.:  [id number]          PATIENT TYPE:  OBV   LOCATION:  9399                          FACILITY:  [facility]   PHYSICIAN:  Guy Sandifer. Tomblin II, M.D.DATE OF BIRTH:  1960-10-31   DATE OF PROCEDURE:  04/06/2004  DATE OF DISCHARGE:                                 OPERATIVE REPORT   PREOPERATIVE DIAGNOSES:  1.  Uterine leiomyomata.  2.  Menometrorrhagia.   POSTOPERATIVE DIAGNOSES:  1.  Uterine leiomyomata.  2.  Menometrorrhagia.  3.  Endometriosis.   PROCEDURE:  Laparoscopically-assisted vaginal hysterectomy with bilateral  salpingo-oophorectomy and lysis of adhesions.   SURGEON:  Guy Sandifer. Henderson Cloud, M.D.   ASSISTANT:  Duke Salvia. Marcelle Overlie, M.D.   ANESTHESIA:  General with endotracheal intubation, Raul Del, M.D.   ESTIMATED BLOOD LOSS:  150 mL.   SPECIMENS:  Uterus with bilateral tubes and ovaries.   INDICATIONS AND CONSENT:  This patient is a 50 year old married black  female, G3, P3, status post tubal ligation, with symptoms of heavy irregular  bleeding.  Details are dictated in the history and physical.  Laparoscopically-assisted vaginal hysterectomy with bilateral salpingo-  oophorectomy is discussed with the patient preoperatively.  The potential  risks and complications preoperatively are discussed, including but not  limited to infection; bowel, bladder or ureteral damage; bleeding requiring  transfusion of blood products with possible transfusion reaction, HIV and  hepatitis acquisition; DVT, PE, and pneumonia; fistula formation;  postoperative dyspareunia; laparotomy; and recurrent pelvic pain.  Issues of  menopause have also been discussed preoperatively.  All questions are  answered and consent is signed on the chart.   FINDINGS:  The upper abdomen is grossly normal.  There is a small omental  adhesion to the right and superior to the umbilicus that does not form a  closed loop.  The  uterus is retroverted, about eight weeks in size, with at  least one 3 cm subserosal fundal leiomyoma.  The anterior cul-de-sac is  normal.  Posterior cul-de-sac is normal.  Left fallopian tube and left  pelvic sidewall are normal.  The right fallopian tube is adherent on its  distal one-third to the pelvic brim.  There are two 1 cm smooth translucent  nodules that are filled with old blood involved in the adhesion, consistent  with probable endometriosis.   PROCEDURE:  The patient is taken to the operating room, where she is  identified, placed in the dorsal supine position, and general anesthesia is  induced via endotracheal intubation.  She is then placed in the dorsal  lithotomy position, where she is prepped abdominally and vaginally.  The  bladder is straight-catheterized.  The Hulka tenaculum is placed in the  uterus as a manipulator, and she is draped in a sterile fashion.  A  subumbilical incision is made and dissection is carried out in layers to the  peritoneum.  Peritoneum is entered bluntly.  Anchoring sutures of 0 Vicryl  are placed in the anterior fascia at each angle of the incision, and the  disposable open laparoscopic trocar sleeve is placed  and tied down.  Placement is verified with the laparoscope, and no damage to the surrounding  structures is noted.  Pneumoperitoneum is induced.  A small suprapubic  incision is made and a 5 mm disposable trocar sleeve is placed under direct  visualization without difficulty.  The above findings are noted.  It should  also be noted, there was a filmy adhesion of the omentum to the left ovary.  The course of the ureters was carefully identified bilaterally and seemed to  be well away from the area of surgery.  The adhesions to the right pelvic  brim were taken down sharply.  Hemostasis is maintained with brief bipolar  cautery at the peritoneal edges.  Then using the Gyrus bipolar cautery  cutting instrument, the infundibulopelvic  ligament is taken down on the  right side, across the mesosalpinx and across the round ligament, down to  the vesicouterine fold.  A similar procedure is carried out on the left  side.  Good hemostasis is noted.  The vesicouterine peritoneum is incised in  the midline, hydrodissected and taken down cephalolaterally.  The suprapubic  trocar sleeve is removed, instruments are removed, and attention is turned  to the vagina.  A weighted speculum is placed.  The posterior cul-de-sac is  entered sharply, then the cervix is circumscribed with unipolar cautery.  The mucosa is advanced sharply and bluntly.  The uterosacral ligaments are  taken down using a 0 Monocryl suture on the left and the Gyrus bipolar  cautery on the right.  The remainder of the ligaments are taken down with  cautery.  The anterior cul-de-sac is entered without difficulty.  The  bladder pillars, followed by the cardinal ligaments and uterine vessels, are  taken bilaterally.  Then placing the long weighted retractor over the  posterior vaginal cuff, the fundus is delivered posteriorly and any  remaining pedicles taken down with cautery.  The specimen was then  delivered.  The bowel was packed away with a wet tape.  The uterosacral  ligaments were plicated to the vaginal cuff bilaterally with separate 0  Monocryl sutures.  All suture will be 0 Monocryl unless otherwise  designated.  The uterosacral ligaments were then plicated in the midline  with a third suture.  The cuff is closed with figure-of-eights.  A Foley  catheter is placed in the bladder and clear urine is noted.  Attention is  returned to the abdomen.  Pneumoperitoneum is reintroduced.  The suprapubic  trocar sleeve is reintroduced under direct visualization and careful  inspection and irrigation reveals excellent hemostasis all around under low  pressure as well.  Interceed is then back-loaded through the laparoscope and placed over the area of dissection on the  right pelvic brim as well as over  the cuff.  All excess fluid is removed.  Pneumoperitoneum is reduced, all  instruments are removed.  The umbilical incision is closed by tying together  the angle sutures in the midline to close the fascia.  Both incisions are  injected with 0.5% plain Marcaine.  The umbilical incision is closed with  simple interrupted 2-0 Vicryl suture.  Both incisions are closed as well  with Dermabond.  All counts are correct.  The patient is awakened, taken to  the recovery room in stable condition.      JET/MEDQ  D:  04/06/2004  T:  04/06/2004  Job:  [id number]

## 2010-08-04 NOTE — Discharge Summary (Signed)
NAMEMARYBELLE, GIRALDO NO.:  [id number]   MEDICAL RECORD NO.:  [id number]          PATIENT TYPE:  INP   LOCATION:  5023                         FACILITY:  [facility]   PHYSICIAN:  Rene Paci, M.D. LHCDATE OF BIRTH:  08-Jun-1960   DATE OF ADMISSION:  01/26/2004  DATE OF DISCHARGE:  01/28/2004                                 DISCHARGE SUMMARY   DISCHARGE DIAGNOSES:  1.  Nausea.  2.  Abdominal pain.  3.  Right ovarian cyst.  4.  Dysfunctional uterine bleeding.  5.  Pseudoseizure.   BRIEF ADMISSION HISTORY:  Ms. Carrol is a 50 year old African American female  who presents with chronic recurrent abdominal pain.  The patient had an  extensive evaluation of her abdominal pain in October of 2005.  She had both  colonoscopy and endoscopy.  Endoscopy was positive for H. pylori which she  completed a triple drug therapy.  Colonoscopy was unremarkable except for  internal and external hemorrhoids.  The patient had a CT of the abdomen and  pelvis as well that was unremarkable.  The patient presented with persistent  complaints of abdominal pain.  She also described some vaginal bleeding.   PAST MEDICAL HISTORY:  1.  History of anemia.  2.  Depression.  3.  History of peptic ulcer disease.  4.  History of pseudoseizure well documented by neurology.  5.  History of left rotator cuff surgery.  6.  Possible migraine history.  7.  History of evaluation of noncardiac chest pain.   HOSPITAL COURSE:  PROBLEM #1 -  ABDOMINAL PAIN:  The patient was admitted  for further evaluation.  She had another CT of the abdomen and pelvis which  was again unremarkable.  Her CMET, amylase, HCG and LH were also normal.  No  further work-up was pursued.   PROBLEM #2 -  GYNECOLOGY:  The patient had been discharging vaginal clots.  This prompted a gynecological evaluation.  The patient was seen by Dr.  Henderson Cloud.  He suspected perhaps menometrorrhagia.  He recommended a pelvic  ultrasound.  This  was performed on January 27, 2004, and revealed a right  ovarian cyst.  His final impression was that this was likely dysfunctional  uterine bleeding with nonhemorrhagic cysts and he recommended outpatient  follow-up.   PROBLEM #3 -  NEUROLOGIC:  On January 28, 2004, a.m. the patient did have  jerking and switching.  This lasted for about five minutes.  We suspect this  was once again pseudoseizure.  Neurology was not consulted as she has had an  extensive work-up as recently as September of 2005.  EEGs have been  unremarkable.  MRI of the brain has been unremarkable.  The patient has also  had extensive work-up at [facility] where pseudoseizure was  confirmed by EEG video monitoring.  This has been an ongoing problem.  The  patient was felt likely to have a conversion disorder rather than lingering.  There is suspected to be underlying psychiatric issues that the patient and  family are both in denial.  In reviewing the patient's old charts  it appears  that psychiatric follow-up has been suggested and refused in the past.  From  our standpoint, there is no indication for further neurological work-up.   LABORATORY DATA AT DISCHARGE:  Urinalysis revealed 11 to 20 rbc, urine  culture was negative.   DISCHARGE MEDICATIONS:  1.  Celexa 5 mg daily.  2.  Gabapentin 300 mg t.i.d.  3.  Lorazepam 2 mg one half tablet b.i.d.  4.  Nexium 40 mg daily.  5.  Cipro 500 mg b.i.d. for an additional five days.   FOLLOW UP:  1.  Dr. Henderson Cloud in one to two weeks.  2.  Dr. Merril Abbe or Dr. Artist Pais whoever it is that follows her from a primary      care standpoint.      Laur   LC/MEDQ  D:  01/28/2004  T:  01/28/2004  Job:  [id number]   cc:   Ike Bene, M.D.  [address]  [place]  [place] [address]  Fax: [phone]   Guy Sandifer. Arleta Creek, M.D.  [address]  [place]  [place] [address]  Fax: [phone]   Venita Lick. Russella Dar, M.D. [facility]

## 2010-09-07 ENCOUNTER — Emergency Department (HOSPITAL_COMMUNITY)
Admission: EM | Admit: 2010-09-07 | Discharge: 2010-09-08 | Disposition: A | Discharge status: Home / Self Care | Payer: 59 | Attending: Emergency Medicine | Admitting: Emergency Medicine

## 2010-09-07 DIAGNOSIS — R569 Unspecified convulsions: Secondary | ICD-10-CM | POA: Insufficient documentation

## 2010-09-07 DIAGNOSIS — F17200 Nicotine dependence, unspecified, uncomplicated: Secondary | ICD-10-CM | POA: Insufficient documentation

## 2010-10-18 ENCOUNTER — Encounter: Payer: Self-pay | Admitting: Internal Medicine

## 2010-10-28 ENCOUNTER — Inpatient Hospital Stay (HOSPITAL_COMMUNITY)
Admission: EM | Admit: 2010-10-28 | Discharge: 2010-11-03 | DRG: 690 | Disposition: A | Discharge status: Skilled Nursing Facility | Payer: 59 | Attending: Internal Medicine | Admitting: Internal Medicine

## 2010-10-28 ENCOUNTER — Observation Stay (HOSPITAL_COMMUNITY): Payer: 59

## 2010-10-28 ENCOUNTER — Emergency Department (HOSPITAL_COMMUNITY): Payer: 59

## 2010-10-28 DIAGNOSIS — F17200 Nicotine dependence, unspecified, uncomplicated: Secondary | ICD-10-CM | POA: Diagnosis present

## 2010-10-28 DIAGNOSIS — R112 Nausea with vomiting, unspecified: Secondary | ICD-10-CM | POA: Diagnosis present

## 2010-10-28 DIAGNOSIS — E860 Dehydration: Secondary | ICD-10-CM | POA: Diagnosis present

## 2010-10-28 DIAGNOSIS — R7402 Elevation of levels of lactic acid dehydrogenase (LDH): Secondary | ICD-10-CM | POA: Diagnosis present

## 2010-10-28 DIAGNOSIS — R569 Unspecified convulsions: Secondary | ICD-10-CM | POA: Diagnosis not present

## 2010-10-28 DIAGNOSIS — G43909 Migraine, unspecified, not intractable, without status migrainosus: Secondary | ICD-10-CM | POA: Diagnosis present

## 2010-10-28 DIAGNOSIS — G8929 Other chronic pain: Secondary | ICD-10-CM | POA: Diagnosis present

## 2010-10-28 DIAGNOSIS — M549 Dorsalgia, unspecified: Secondary | ICD-10-CM | POA: Diagnosis present

## 2010-10-28 DIAGNOSIS — R7401 Elevation of levels of liver transaminase levels: Secondary | ICD-10-CM | POA: Diagnosis present

## 2010-10-28 DIAGNOSIS — N12 Tubulo-interstitial nephritis, not specified as acute or chronic: Principal | ICD-10-CM | POA: Diagnosis present

## 2010-10-28 DIAGNOSIS — F329 Major depressive disorder, single episode, unspecified: Secondary | ICD-10-CM | POA: Diagnosis present

## 2010-10-28 DIAGNOSIS — F3289 Other specified depressive episodes: Secondary | ICD-10-CM | POA: Diagnosis present

## 2010-10-28 LAB — RAPID URINE DRUG SCREEN, HOSP PERFORMED
Amphetamines: NOT DETECTED
Barbiturates: NOT DETECTED
Benzodiazepines: NOT DETECTED
Cocaine: NOT DETECTED
Opiates: NOT DETECTED
Tetrahydrocannabinol: POSITIVE — AB

## 2010-10-28 LAB — COMPREHENSIVE METABOLIC PANEL
ALT: 50 U/L — ABNORMAL HIGH (ref 0–35)
AST: 84 U/L — ABNORMAL HIGH (ref 0–37)
Albumin: 2.9 g/dL — ABNORMAL LOW (ref 3.5–5.2)
Alkaline Phosphatase: 122 U/L — ABNORMAL HIGH (ref 39–117)
BUN: 9 mg/dL (ref 6–23)
CO2: 23 mEq/L (ref 19–32)
Calcium: 9.4 mg/dL (ref 8.4–10.5)
Chloride: 94 mEq/L — ABNORMAL LOW (ref 96–112)
Creatinine, Ser: 0.64 mg/dL (ref 0.50–1.10)
GFR calc Af Amer: >60 mL/min (ref >60)
GFR calc non Af Amer: >60 mL/min (ref >60)
Glucose, Bld: 117 mg/dL — ABNORMAL HIGH (ref 70–99)
Potassium: 3.2 mEq/L — ABNORMAL LOW (ref 3.5–5.1)
Sodium: 130 mEq/L — ABNORMAL LOW (ref 135–145)
Total Bilirubin: 0.6 mg/dL (ref 0.3–1.2)
Total Protein: 7.6 g/dL (ref 6.0–8.3)

## 2010-10-28 LAB — CK TOTAL AND CKMB (NOT AT ARMC)
CK, MB: 1.2 ng/mL (ref 0.3–4.0)
Relative Index: INVALID (ref 0.0–2.5)
Total CK: 28 U/L (ref 7–177)

## 2010-10-28 LAB — DIFFERENTIAL
Basophils Absolute: 0 10*3/uL (ref 0.0–0.1)
Basophils Relative: 0 % (ref 0–1)
Eosinophils Absolute: 0 10*3/uL (ref 0.0–0.7)
Eosinophils Relative: 0 % (ref 0–5)
Lymphocytes Relative: 11 % — ABNORMAL LOW (ref 12–46)
Lymphs Abs: 1.6 10*3/uL (ref 0.7–4.0)
Monocytes Absolute: 0.9 10*3/uL (ref 0.1–1.0)
Monocytes Relative: 6 % (ref 3–12)
Neutro Abs: 12.1 10*3/uL — ABNORMAL HIGH (ref 1.7–7.7)
Neutrophils Relative %: 83 % — ABNORMAL HIGH (ref 43–77)

## 2010-10-28 LAB — CBC
HCT: 43 % (ref 36.0–46.0)
Hemoglobin: 15.5 g/dL — ABNORMAL HIGH (ref 12.0–15.0)
MCH: 28.5 pg (ref 26.0–34.0)
MCHC: 36 g/dL (ref 30.0–36.0)
MCV: 79.2 fL (ref 78.0–100.0)
Platelets: 247 10*3/uL (ref 150–400)
RBC: 5.43 MIL/uL — ABNORMAL HIGH (ref 3.87–5.11)
RDW: 14.4 % (ref 11.5–15.5)
WBC: 14.6 10*3/uL — ABNORMAL HIGH (ref 4.0–10.5)

## 2010-10-28 LAB — URINALYSIS, ROUTINE W REFLEX MICROSCOPIC
Bilirubin Urine: NEGATIVE
Glucose, UA: NEGATIVE mg/dL
Ketones, ur: 15 mg/dL — AB
Nitrite: POSITIVE — AB
Protein, ur: 100 mg/dL — AB
Specific Gravity, Urine: 1.012 (ref 1.005–1.030)
Urobilinogen, UA: 2 mg/dL — ABNORMAL HIGH (ref 0.0–1.0)
pH: 6.5 (ref 5.0–8.0)

## 2010-10-28 LAB — URINE MICROSCOPIC-ADD ON

## 2010-10-28 LAB — TROPONIN I: Troponin I: <0.3 ng/mL (ref <0.30)

## 2010-10-28 LAB — LIPASE, BLOOD: Lipase: 16 U/L (ref 11–59)

## 2010-10-29 LAB — COMPREHENSIVE METABOLIC PANEL
ALT: 53 U/L — ABNORMAL HIGH (ref 0–35)
AST: 79 U/L — ABNORMAL HIGH (ref 0–37)
Albumin: 2.6 g/dL — ABNORMAL LOW (ref 3.5–5.2)
Alkaline Phosphatase: 118 U/L — ABNORMAL HIGH (ref 39–117)
BUN: 4 mg/dL — ABNORMAL LOW (ref 6–23)
CO2: 27 mEq/L (ref 19–32)
Calcium: 8.7 mg/dL (ref 8.4–10.5)
Chloride: 103 mEq/L (ref 96–112)
Creatinine, Ser: 0.49 mg/dL — ABNORMAL LOW (ref 0.50–1.10)
GFR calc Af Amer: >60 mL/min (ref >60)
GFR calc non Af Amer: >60 mL/min (ref >60)
Glucose, Bld: 110 mg/dL — ABNORMAL HIGH (ref 70–99)
Potassium: 3.5 mEq/L (ref 3.5–5.1)
Sodium: 138 mEq/L (ref 135–145)
Total Bilirubin: 0.3 mg/dL (ref 0.3–1.2)
Total Protein: 6.5 g/dL (ref 6.0–8.3)

## 2010-10-29 LAB — CBC
HCT: 36.6 % (ref 36.0–46.0)
Hemoglobin: 12.4 g/dL (ref 12.0–15.0)
MCH: 27.1 pg (ref 26.0–34.0)
MCHC: 33.9 g/dL (ref 30.0–36.0)
MCV: 80.1 fL (ref 78.0–100.0)
Platelets: 283 10*3/uL (ref 150–400)
RBC: 4.57 MIL/uL (ref 3.87–5.11)
RDW: 15 % (ref 11.5–15.5)
WBC: 9.4 10*3/uL (ref 4.0–10.5)

## 2010-10-30 ENCOUNTER — Observation Stay (HOSPITAL_COMMUNITY): Payer: 59

## 2010-10-30 ENCOUNTER — Inpatient Hospital Stay (HOSPITAL_COMMUNITY): Payer: 59

## 2010-10-30 LAB — URINE CULTURE
Colony Count: >=100000
Culture  Setup Time: 201208112205

## 2010-10-30 LAB — BASIC METABOLIC PANEL
BUN: 6 mg/dL (ref 6–23)
CO2: 26 mEq/L (ref 19–32)
Calcium: 9.3 mg/dL (ref 8.4–10.5)
Chloride: 103 mEq/L (ref 96–112)
Creatinine, Ser: 0.57 mg/dL (ref 0.50–1.10)
GFR calc Af Amer: >60 mL/min (ref >60)
GFR calc non Af Amer: >60 mL/min (ref >60)
Glucose, Bld: 96 mg/dL (ref 70–99)
Potassium: 4 mEq/L (ref 3.5–5.1)
Sodium: 138 mEq/L (ref 135–145)

## 2010-10-30 LAB — COMPREHENSIVE METABOLIC PANEL
ALT: 107 U/L — ABNORMAL HIGH (ref 0–35)
AST: 82 U/L — ABNORMAL HIGH (ref 0–37)
Albumin: 2.7 g/dL — ABNORMAL LOW (ref 3.5–5.2)
Alkaline Phosphatase: 143 U/L — ABNORMAL HIGH (ref 39–117)
BUN: 6 mg/dL (ref 6–23)
CO2: 27 mEq/L (ref 19–32)
Calcium: 9.7 mg/dL (ref 8.4–10.5)
Chloride: 100 mEq/L (ref 96–112)
Creatinine, Ser: 0.65 mg/dL (ref 0.50–1.10)
GFR calc Af Amer: >60 mL/min (ref >60)
GFR calc non Af Amer: >60 mL/min (ref >60)
Glucose, Bld: 95 mg/dL (ref 70–99)
Potassium: 3.6 mEq/L (ref 3.5–5.1)
Sodium: 140 mEq/L (ref 135–145)
Total Bilirubin: 0.2 mg/dL — ABNORMAL LOW (ref 0.3–1.2)
Total Protein: 7.3 g/dL (ref 6.0–8.3)

## 2010-10-30 LAB — TROPONIN I
Troponin I: <0.3 ng/mL (ref <0.30)
Troponin I: <0.3 ng/mL (ref <0.30)
Troponin I: <0.3 ng/mL (ref <0.30)

## 2010-10-30 LAB — POCT I-STAT, CHEM 8
BUN: 5 mg/dL — ABNORMAL LOW (ref 6–23)
Calcium, Ion: 1.16 mmol/L (ref 1.12–1.32)
Chloride: 100 mEq/L (ref 96–112)
Creatinine, Ser: 0.8 mg/dL (ref 0.50–1.10)
Glucose, Bld: 102 mg/dL — ABNORMAL HIGH (ref 70–99)
HCT: 44 % (ref 36.0–46.0)
Hemoglobin: 15 g/dL (ref 12.0–15.0)
Potassium: 3.5 mEq/L (ref 3.5–5.1)
Sodium: 141 mEq/L (ref 135–145)
TCO2: 28 mmol/L (ref 0–100)

## 2010-10-30 LAB — CBC
HCT: 38.5 % (ref 36.0–46.0)
Hemoglobin: 13.6 g/dL (ref 12.0–15.0)
MCH: 28.3 pg (ref 26.0–34.0)
MCHC: 35.3 g/dL (ref 30.0–36.0)
MCV: 80 fL (ref 78.0–100.0)
Platelets: 333 10*3/uL (ref 150–400)
RBC: 4.81 MIL/uL (ref 3.87–5.11)
RDW: 14.9 % (ref 11.5–15.5)
WBC: 8.9 10*3/uL (ref 4.0–10.5)

## 2010-10-30 LAB — MRSA PCR SCREENING: MRSA by PCR: NEGATIVE

## 2010-10-30 LAB — PHOSPHORUS: Phosphorus: 2.9 mg/dL (ref 2.3–4.6)

## 2010-10-30 LAB — MAGNESIUM: Magnesium: 2.2 mg/dL (ref 1.5–2.5)

## 2010-10-31 DIAGNOSIS — F329 Major depressive disorder, single episode, unspecified: Secondary | ICD-10-CM

## 2010-10-31 LAB — BASIC METABOLIC PANEL
BUN: 7 mg/dL (ref 6–23)
CO2: 33 mEq/L — ABNORMAL HIGH (ref 19–32)
Calcium: 9.8 mg/dL (ref 8.4–10.5)
Chloride: 103 mEq/L (ref 96–112)
Creatinine, Ser: 0.69 mg/dL (ref 0.50–1.10)
GFR calc Af Amer: >60 mL/min (ref >60)
GFR calc non Af Amer: >60 mL/min (ref >60)
Glucose, Bld: 98 mg/dL (ref 70–99)
Potassium: 4.6 mEq/L (ref 3.5–5.1)
Sodium: 142 mEq/L (ref 135–145)

## 2010-10-31 LAB — CBC
HCT: 39.7 % (ref 36.0–46.0)
Hemoglobin: 13.8 g/dL (ref 12.0–15.0)
MCH: 28 pg (ref 26.0–34.0)
MCHC: 34.8 g/dL (ref 30.0–36.0)
MCV: 80.7 fL (ref 78.0–100.0)
Platelets: 353 10*3/uL (ref 150–400)
RBC: 4.92 MIL/uL (ref 3.87–5.11)
RDW: 15 % (ref 11.5–15.5)
WBC: 7.1 10*3/uL (ref 4.0–10.5)

## 2010-10-31 LAB — TROPONIN I
Troponin I: <0.3 ng/mL (ref <0.30)
Troponin I: <0.3 ng/mL (ref <0.30)

## 2010-10-31 NOTE — Consult Note (Signed)
Ruth Bell, Ruth Bell NO.:  [id number]  MEDICAL RECORD NO.:  [id number]  LOCATION:  3108                         FACILITY:  [facility]  PHYSICIAN:  Levie Heritage, MD       DATE OF BIRTH:  Nov 14, 1960  DATE OF CONSULTATION: DATE OF DISCHARGE:                                CONSULTATION   REASON FOR CONSULTATION:  Questionable seizure.  HISTORY OF PRESENT ILLNESS:  This is a 50 year old female with past medical history of documented video EEG pseudoseizures, rotator cuff tear status post surgery, depression, migraine headaches.  The patient has had a history of pseudoseizures and apparently was followed by Dr. Sandria Manly in the past.  At that time there was documented video EEG of pseudoseizures at [facility].  The patient was admitted to [facility] for pyelonephritis at which time during hospitalization the patient showed seizure-like activity.  This was witnessed by myself while in the room.  The seizure-like event showed the patient to be jerking throughout her upper and lower extremities, however, the patient would briskly withdraw from any noxious stimuli, obey some commands while having seizure-like activity, and when specifically arm was palpated, the seizure activity would specific in that specific body part.  Post activity, the patient showed no postictal period.  No incontinence and no tongue biting.  The patient abruptly would sit up, look around, and start crying.  At present time, the patient is alert, she is oriented.  Family members are at bedside stating she has been under significant pressure and stress over the past few days.  PAST MEDICAL HISTORY:  As mentioned above.  MEDICATIONS:  She is on Cipro for pyelonephritis, Tranxene, Neurontin, morphine sulfate, and Zofran.  ALLERGIES:  No known drug allergies.  SOCIAL HISTORY:  The patient does not smoke.  She does drink 3+ beers a day.  Does not do illicit drugs.  REVIEW OF SYSTEMS:   Negative with exception of above.  PHYSICAL EXAMINATION:  VITAL SIGNS:  Temperature is 99.5, blood pressure 119/76, heart rate is 97, respirations 16. LUNGS:  Clear to auscultation bilaterally. NECK:  Negative bruits. CARDIOVASCULAR:  Regular rate and rhythm. ABDOMEN:  Bowel sounds are heard in all 4 quadrants.  Soft and nontender. SKIN:  Warm, dry, intact. NEUROLOGICAL:  Pupils are equal, round, and reactive to light and accommodating, 3 mm to 2 mm.  Tongue is midline.  Face equal, symmetrical.  Speech is clear.  Vision is intact.  Double simultaneous stimuli.  The patient moves bilateral arms and legs antigravity and withdraws from pain briskly.  Sensation is stated to be intact to pinprick and light touch.  Deep tendon reflexes 1+ throughout and downgoing toes.  TEST RESULTS:  Sodium 138, potassium 4.0, chloride 103, CO2 is 28, BUN 6, creatinine 0.57, glucose 96, white blood cell count is 9.4, platelets 283,000.  Hemoglobin 12.4, hematocrit 36.6.  The patient's urine drug screen was positive for THC.  Urinalysis showed positive for E. coli. AST was 84, ALT was 50.  CT of head is pending, EEG is pending.  ASSESSMENT:  This is a 50 year old female admitted secondary to urinary tract infection with history of documented pseudoseizures on  EMU evaluation.  The patient is having seizure-like activity while in the hospital with different semiologies.  The patient's seizure activity stops immediately with holding her shaking limbs. In addition, the patient is able to follow commands while having seizure-like activity.    Recommendations: 1. Obtain a psychiatric consult for stressors and depression. 2. Start the patient on Keppra low dose 250 mg b.i.d; although     Depakote would be a better choice and unfortunately the patient's     LFTs are too elevated. 3. Obtain EEG. 4. If the patient continues to have seizure-like activity, would     recommend the patient returning to [facility] for EMU.  Dr. Hoy Morn has seen and evaluated the patient and agrees with above- mentioned.     Felicie Morn, PA-C  I ahve seen the patient and agree with above clinical findings. ______________________________ Levie Heritage, MD    DS/MEDQ  D:  10/30/2010  T:  10/30/2010  Job:  [id number]  cc:   Levie Heritage, MD  Electronically Signed by Felicie Morn PA-C on 10/31/2010 08:28:30 AM Electronically Signed by Levie Heritage MD on 10/31/2010 08:45:43 AM

## 2010-10-31 NOTE — Procedures (Unsigned)
EEG NUMBER:  REFERRING PHYSICIAN:  Dr. Betti Cruz.  HISTORY:  A 50 year old woman with history of pseudoseizures as evaluated in the epilepsy monitoring unit of the [facility] years ago.  She is admitted after having nausea, vomiting, and multiple seizures.  One of those has been also recorded during the EEG.  MEDICATIONS:  Albuterol, Atrovent, Rocephin, Morphine, potassium chloride, clorazepate, Neurontin, Percocet, Zofran, Phenergan, Zoloft.  EEG DURATION:  23.5 minutes of EEG recording time.  EEG DESCRIPTION:  This is a routine 16-channel adult EEG recording with one channel devoted to limited EKG recording.  Activation procedure is performed during the photic stimulation and the study is performed in awake state.  As the EEG opens up, I notice that the background rhythm is well-formed at 9 Hz frequency with an amplitude ranging from 12-20 microvolts.  At least 2 to 3 brief convulsive events were noted during the study with the legs and arm shaking and whole body shaking at one time; however, none of them had any electrographic correlation pointing towards epileptogenic activity.  No driving was noted with the photic stimulation in posterior leads.  Significant EMG artifacts was recorded due to her movement and spells.  There is no epileptogenic activity recorded during this study.  There is no sleep pattern noted either.  EEG INTERPRETATION:  This is a normal awake EEG.  The shaking spell the patient had during EEG do not have any electrographic correlate pointing towards epileptogenic activity.  These are all non-epileptogenic spells. There is no other abnormality on the EEG.          ______________________________ Levie Heritage, MD    ZO:[id number] D:  10/30/2010 15:28:11  T:  10/31/2010 01:11:29  Job #:  [id number]

## 2010-11-01 ENCOUNTER — Inpatient Hospital Stay (HOSPITAL_COMMUNITY): Payer: 59

## 2010-11-01 DIAGNOSIS — F329 Major depressive disorder, single episode, unspecified: Secondary | ICD-10-CM

## 2010-11-01 LAB — CBC
HCT: 38.8 % (ref 36.0–46.0)
Hemoglobin: 12.9 g/dL (ref 12.0–15.0)
MCH: 27 pg (ref 26.0–34.0)
MCHC: 33.2 g/dL (ref 30.0–36.0)
MCV: 81.2 fL (ref 78.0–100.0)
Platelets: 424 10*3/uL — ABNORMAL HIGH (ref 150–400)
RBC: 4.78 MIL/uL (ref 3.87–5.11)
RDW: 15.3 % (ref 11.5–15.5)
WBC: 8.4 10*3/uL (ref 4.0–10.5)

## 2010-11-01 LAB — COMPREHENSIVE METABOLIC PANEL
ALT: 67 U/L — ABNORMAL HIGH (ref 0–35)
AST: 31 U/L (ref 0–37)
Albumin: 2.7 g/dL — ABNORMAL LOW (ref 3.5–5.2)
Alkaline Phosphatase: 117 U/L (ref 39–117)
BUN: 8 mg/dL (ref 6–23)
CO2: 32 mEq/L (ref 19–32)
Calcium: 9.6 mg/dL (ref 8.4–10.5)
Chloride: 100 mEq/L (ref 96–112)
Creatinine, Ser: 0.57 mg/dL (ref 0.50–1.10)
GFR calc Af Amer: >60 mL/min (ref >60)
GFR calc non Af Amer: >60 mL/min (ref >60)
Glucose, Bld: 90 mg/dL (ref 70–99)
Potassium: 4.6 mEq/L (ref 3.5–5.1)
Sodium: 139 mEq/L (ref 135–145)
Total Bilirubin: 0.3 mg/dL (ref 0.3–1.2)
Total Protein: 7 g/dL (ref 6.0–8.3)

## 2010-11-01 NOTE — Consult Note (Signed)
Ruth Bell, Ruth Bell NO.:  [id number]  MEDICAL RECORD NO.:  [id number]  LOCATION:  08/26/06                         FACILITY:  [facility]  PHYSICIAN:  Eulogio Ditch, MD DATE OF BIRTH:  05/24/1960  DATE OF CONSULTATION:  10/31/2010 DATE OF DISCHARGE:                                CONSULTATION   FACILITY:  [facility].  REASON FOR CONSULTATION:  Pseudoseizures.  HISTORY OF PRESENT ILLNESS:  A 50 year old female who reported history of seizures since August 26, 1998, which started after the death of the mother and the seizures became worse after the death of the father in 2002/08/26.  The patient is currently getting Keppra 500 mg twice a day.  The patient reported one psych hospitalization in the past in [facility] in 08-26-98 after the death of the mother, as per patient, "I had a nervous breakdown."  The patient stayed in the hospital for 5 days.  The patient denied any suicide attempt in the past.  On asking about suicide, she told me "I love myself."  The patient reported drinking alcohol about three beers every day, but denied using cocaine or heroin, but takes marijuana as per her for seizures and the last time she took was 6 days ago.  The patient currently lives with the husband and his son.  On asking about her stressors, she told me when she is out of the job she become more stressed out as she has to take care of the grand kids when they are out of the school, and when they go back to school, then the job people call her and then she is okay, but during the time she is not working, she is more stressed out and depressed.  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  On asking whether her job is stressful, she told me she love her job.  Besides this, she denied any other stressors in her life.  She was seen by Neurology, and as per them, there was no postictal activity, no incontinence, no  tongue biting.  MENTAL STATUS EXAM:  The patient is calm, cooperative during interview. Fair eye contact.  No psychomotor agitation or retardation noted. Speech, slow and soft.  Mood, depressed affect, mood congruent, but the patient is not suicidal or homicidal, is not delusional or hallucinating.  She is alert, awake, oriented x3.  Memory immediate, recent, and remote fair.  Attention concentration fair.  Abstraction ability fair.  Insight and judgment fair.  DIAGNOSES:  AXIS I:  Depressive disorder, not otherwise specified, rule out major depressive disorder, recurrent type without psychotic symptoms. AXIS II:  Deferred. AXIS III:  See medical notes. AXIS IV:  As discussed in HPI, not working while taking care of the grand kids makes her more stressed. AXIS V:  55.  RECOMMENDATIONS: 1. At this time, the patient can be continued her current combination     medication, Keppra 500 b.i.d., clorazepate     7.5 b.i.d., Neurontin 600 mg t.i.d., and Vistaril as needed.  Her     Zoloft can be increased from 50 to 100 mg.  2. The patient can be followed in the outpatient setting and I told     her that she need regular counseling.  The patient agreed on that.     Eulogio Ditch, MD     SA/MEDQ  D:  10/31/2010  T:  10/31/2010  Job:  [id number]  Electronically Signed by Eulogio Ditch  on 11/01/2010 09:42:49 AM

## 2010-11-02 ENCOUNTER — Inpatient Hospital Stay (HOSPITAL_COMMUNITY): Payer: 59

## 2010-11-02 DIAGNOSIS — F329 Major depressive disorder, single episode, unspecified: Secondary | ICD-10-CM

## 2010-11-03 LAB — COMPREHENSIVE METABOLIC PANEL
ALT: 88 U/L — ABNORMAL HIGH (ref 0–35)
AST: 83 U/L — ABNORMAL HIGH (ref 0–37)
Albumin: 3 g/dL — ABNORMAL LOW (ref 3.5–5.2)
Alkaline Phosphatase: 113 U/L (ref 39–117)
BUN: 13 mg/dL (ref 6–23)
CO2: 32 mEq/L (ref 19–32)
Calcium: 10.1 mg/dL (ref 8.4–10.5)
Chloride: 100 mEq/L (ref 96–112)
Creatinine, Ser: 0.63 mg/dL (ref 0.50–1.10)
GFR calc Af Amer: >60 mL/min (ref >60)
GFR calc non Af Amer: >60 mL/min (ref >60)
Glucose, Bld: 89 mg/dL (ref 70–99)
Potassium: 4.9 mEq/L (ref 3.5–5.1)
Sodium: 139 mEq/L (ref 135–145)
Total Bilirubin: 0.3 mg/dL (ref 0.3–1.2)
Total Protein: 7.9 g/dL (ref 6.0–8.3)

## 2010-11-03 LAB — CBC
HCT: 39.6 % (ref 36.0–46.0)
Hemoglobin: 13.4 g/dL (ref 12.0–15.0)
MCH: 27.6 pg (ref 26.0–34.0)
MCHC: 33.8 g/dL (ref 30.0–36.0)
MCV: 81.6 fL (ref 78.0–100.0)
Platelets: 543 10*3/uL — ABNORMAL HIGH (ref 150–400)
RBC: 4.85 MIL/uL (ref 3.87–5.11)
RDW: 15.1 % (ref 11.5–15.5)
WBC: 9.8 10*3/uL (ref 4.0–10.5)

## 2010-11-04 LAB — CULTURE, BLOOD (ROUTINE X 2)
Culture  Setup Time: 201208120126
Culture  Setup Time: 201208120126
Culture: NO GROWTH
Culture: NO GROWTH

## 2010-11-06 LAB — GLUCOSE, CAPILLARY: Glucose-Capillary: 113 mg/dL — ABNORMAL HIGH (ref 70–99)

## 2010-11-09 NOTE — H&P (Signed)
Ruth Bell, TOTTEN NO.:  [id number]  MEDICAL RECORD NO.:  [id number]  LOCATION:  5121                         FACILITY:  [facility]  PHYSICIAN:  Tarry Kos, MD       DATE OF BIRTH:  1960-11-09  DATE OF ADMISSION:  10/28/2010 DATE OF DISCHARGE:                             HISTORY & PHYSICAL   CHIEF COMPLAINT:  Nausea, vomiting, left flank pain.  HISTORY OF PRESENT ILLNESS:  Ms. Ruth Bell is a pleasant 50 year old female with a history of pseudoseizures, migraine headaches, chronic back pain who presents to the emergency department because of 4 days of nausea and vomiting.  She states that she ran a fever yesterday up to 105,  has had nonbloody normal vomit and left flank pain that started this morning and she has been having a lot of dysuria.  She has received some IV fluids and Rocephin in the emergency department and she feels better.  However, she has not been able to keep much down and still dry heaving in the emergency department.  For this reason, we are being asked to admit the patient.  REVIEW OF SYSTEMS:  Otherwise negative.  PAST MEDICAL HISTORY:  Pseudoseizure, history of migraines, status post hysterectomy and bilateral SPO, status post appendectomy.  ALLERGIES:  None.  MEDICATIONS:  She does not know.  SOCIAL HISTORY:  She does smoke.  She drinks 3 beers a day.  No other illicit drugs.  PHYSICAL EXAMINATION:  VITALS:  Temperature max in the ED is 101.4, blood pressure 97/69, pulse of 113, respirations 18, 100% O2 sats on room air. GENERAL:  She is alert and oriented x4.  No apparent distress, cooperative friendly. HEENT:  Extraocular muscles are intact.  Pupils are reactive to light. Oropharynx clear.  Mucous membranes moist. NECK:  No JVD.  No carotid bruits. COR:  Regular rate and rhythm without murmurs, rubs, or gallops. CHEST:  Clear to auscultation bilaterally.  No wheezing, rhonchi, or rales. ABDOMEN:  Soft.  She has got left CVA  tenderness.  She has got a soft abdomen, nondistended.  Positive bowel sounds.  No rebound or guarding. EXTREMITIES:  No clubbing, cyanosis, or edema. PSYCH:  Normal mood and affect. NEURO:  No focal neurologic deficits. SKIN:  No rashes.  LABORATORY DATA:  Urinalysis, large amount of blood, ketones positive for nitrites, large amount of leukocytes, many bacteria.  White count is 14.6, hemoglobin is 15.5.  Lipase is normal.  LFTs, she has got a AST of 84, ALT of 50, alk phos 122.  Sodium 130, potassium is 3.2.  Chest x-ray negative.  ASSESSMENT/PLAN:  This is a 50 year old female with left pyelonephritis and nausea and vomiting. 1. Left pyelonephritis.  Urine culture has been sent off.  I am going     to place her on Rocephin 1 grams IV q.24 h. 2. Nausea and vomiting secondary to above.  I will place her on Zofran     as needed and provide her some pain medications.  I am also going     to obtain a CAT scan of her abdomen and pelvis.  Her physical exam     is really over exaggerated.  I think when you touch her, she yellsout.  Her abdomen is benign but due to the inconsistencies with her     reaction to touching her, I am going to go ahead and get a CAT scan     of her abdomen and pelvis to make sure there is nothing more     serious going on intra-abdominally. 3. History of pseudoseizures.  Clarify home medications and resume.     The patient is full code.  Further recommendation pending on     overall hospital course.          ______________________________ Tarry Kos, MD     RD/MEDQ  D:  10/28/2010  T:  10/28/2010  Job:  [id number]  Electronically Signed by Tarry Kos MD on 11/09/2010 10:56:09 AM

## 2010-12-08 ENCOUNTER — Telehealth: Payer: Self-pay | Admitting: *Deleted

## 2010-12-08 NOTE — Telephone Encounter (Signed)
Received message from [place] with [facility] requesting order for wheelchair cushions. Please call [phone] ext [phone].

## 2010-12-11 NOTE — Telephone Encounter (Signed)
Left verbal auth on Ruth Bell's voicemail to proceed with wheelchair cushions.

## 2010-12-11 NOTE — Telephone Encounter (Signed)
Ok for verbal order for wheelchair cushion to home care agency

## 2010-12-13 NOTE — Discharge Summary (Signed)
NAMEELISEA, KHADER NO.:  [id number]  MEDICAL RECORD NO.:  [id number]  LOCATION:  3018                         FACILITY:  [facility]  PHYSICIAN:  Reiko Vinje I Kloi Brodman, MD      DATE OF BIRTH:  03/01/1961  DATE OF ADMISSION:  10/28/2010 DATE OF DISCHARGE:  11/03/2010                              DISCHARGE SUMMARY   NEUROLOGIST:  Dr. Hyacinth Meeker of [facility].  Office [phone].  Cell phone [phone].  DISCHARGE DIAGNOSES: 1. Escherichia coli urinary tract infection, completed 7 days of     treatment. 2. Pseudoseizure with frequent result of seizure spell in the     hospital, requesting neurologic consultation, which is felt     secondary to pseudoseizure. 3. Sudden onset of bilateral lower extremity weakness with     paresthesias, felt to be secondary to functional questionable     conversion versus malingering. 4. Mildly elevated liver function tests, need to be followed up as an     outpatient. 5. History of depression. 6. History of migraines.  CONSULTATIONS:  Neurology consulted and Psychiatric consulted.  PROCEDURES: 1. Chest x-ray, no active disease. 2. CT abdomen and pelvis without contrast.  No renal stone or     obstruction.  Minimal fatty infiltration to the inferior pararenal     fat of the right kidney, but could represent inflammatory change.     Pyelonephritis not excluded.  No arbs are is identified. 3. CT head without contrast.  No evidence of acute intracranial     hemorrhage, mass, or lesion. 4. MRI thoracolumbar spine is negative. 5. Chest x-ray, no significant abnormality identified.  HISTORY OF PRESENT ILLNESS:  This is a 50 year old African American female with a history of pseudoseizure, migraine headache, and chronic back pain, who presented to the ED with 4 days of nausea and vomiting. She is admitting she ran fevers up to 105.  She complained of nonbloody vomitus and left flank pain that started this morning.  In  the emergency room, examination did show vital signs blood pressure 97/69, pulse rate 113, respiratory rate 18, and saturation 100% on room air.  ED temperature 101.4.  Urinalysis did show a large amount of blood, ketones, positive for nitrate, a large amount of leukocytes, and many bacteria.  Had a CT of the abdomen and pelvis without contrast, suggest possibility of pyelonephritis. 1. E. coli urinary tract infection and pyelonephritis.  The patient's     culture did grow E. coli which is sensitive to ceftriaxone and the     patient placed on ceftriaxone and is switched to Cipro.  The     patient completed 7 days of treatment. 2. Nausea and vomiting, completely resolved. 3. During the hospital stay, the patient had a code blue.  Requesting     a neurologic consultation for the possibility of a seizure.  The     patient underwent an EEG which did show a normal awake EEG.  The     shaking that the patient did have during the EEG do not have any     electrographic correlation pointing toward epileptiform activity.     They are  all nonepileptiform.  Accordingly a diagnosis of     pseudoseizure was made.  I contacted Dr. Hyacinth Meeker at [facility],     phone number as above, and he admitted the patient, has a history     of pseudoseizure, and all the spells are related to pseudoseizure.     The patient continued to have a result of unresponsiveness.     Accordingly, a code blue, as I mentioned, was called.  No     medication was administered and no CPR was done, and the patient     immediately awoke.  We did reconsult Neurology and Neurology agreed     that the patient has pseudoseizures.  On Wednesday did evaluate the     patient and the patient admitted she cannot move her lower     extremities.  On neurologic examination, the patient had power of     0/5 on her bilateral lower extremities.  I immediately consulted     the neurologic team to see.  It could be a part of her behavior      change.  An MRI of her thoracolumbar spine was done which was     negative and Neurology did not feel any further neurologic workup     was needed.  Psychiatric was consulted and requested Prozac 20 mg     p.o. daily, also requesting arranging Psychiatry/[facility] to evaluate and followup with the patient.  Dr.     Rogers Blocker did see the patient and followed up with [name], and     requesting Prozac and did not feel the patient was meeting the     criteria to admit to the inpatient psychiatric floor.  Per     Neurologic evaluation of the bilateral extremities, the patient did     not show any effort and no contraction noted, and reflexes are     downgoing.  DTRs positive throughout.  On sensation, the patient     denies.  Fine touch is negative.  Painful stimuli is negative.  The     patient feels her sensation mainly is below her lower extremities.     Currently, after discussion with the Neurologic group, said there     is no further neurologic followup needed, but the patient could     follow up with her neurologist if her family would like or were     more concerned about her lower extremity weakness.  PT/OT     recommended SNF placement.  Also, during this period of time, the     patient was placed on the tele monitor and there is no fatal     arrhythmia that could aggravate the seizure or the syncopal     episode.  I did watch one of those when the patient was working     with the physical therapist.  Suddenly, the patient collapsed on     the floor.  Checking the patient's vital signs, they were     completely normal.  CBG normal.  No seizure activity was seen, and     the patient immediately responded after 5-10 minutes.  PHYSICAL EXAMINATION:  GENERAL:  Currently, the patient is awake, alert, and oriented x3 during examination. HEENT:  Pupils equally reactive to light and accommodation. NECK:  Supple.  No lymphadenopathy and no masses. HEART:  S1 and S2 with no added  sounds. LUNGS:  Normal vesicular breathing without any crackles. ABDOMEN:  Soft and  nontender.  Bowel sounds positive.  No organomegaly. No rebound. EXTREMITIES:  No lower limb edema. CNS:  The patient is awake, alert, and oriented x3.  Upper extremities with normal power and reflexes.  Cranial nerves from II through XII seem intact.  Lower extremities has power 0/5, but as we mentioned, the patient did not have any effort.  There is no evidence of contraction. Sensation is absent on her lower limbs, but present on both knees.  DTRs +2.  Currently, the patient is stable for discharge.  She needs to follow up with her neurologist if the family is requesting a second opinion. Also, the patient needs to follow up with [facility] for further evaluation of her depression.  DISCHARGE MEDICATIONS: 1. Prozac 20 mg daily. 2. Nicotine 40 mg daily transdermally. 3. Ativan 1 mg twice daily as needed. 4. Clorazepate 7.5 mg twice daily. 5. Neurontin 600 mg 3 times daily. 6. Percocet 5/325 mg q.4 h. as needed. 7. Albuterol inhaler 4 times daily as needed. 8. Tylenol. 9. Tapentadol HCL 75 mg twice daily.     Geeta Dworkin Bosie Helper, MD     HIE/MEDQ  D:  11/03/2010  T:  11/03/2010  Job:  [id number]  Electronically Signed by Ebony Cargo MD on 12/13/2010 10:33:34 AM

## 2010-12-18 LAB — COMPREHENSIVE METABOLIC PANEL
ALT: 23
AST: 25
Albumin: 3.9
Alkaline Phosphatase: 53
BUN: 8
CO2: 29
Calcium: 9.7
Chloride: 105
Creatinine, Ser: 0.53
GFR calc Af Amer: >60
GFR calc non Af Amer: >60
Glucose, Bld: 91
Potassium: 4
Sodium: 140
Total Bilirubin: 0.9
Total Protein: 7.5

## 2010-12-18 LAB — DIFFERENTIAL
Basophils Absolute: 0.1
Basophils Relative: 1
Eosinophils Absolute: 0.4
Eosinophils Relative: 5
Lymphocytes Relative: 27
Lymphs Abs: 2
Monocytes Absolute: 0.4
Monocytes Relative: 5
Neutro Abs: 4.8
Neutrophils Relative %: 63

## 2010-12-18 LAB — URINALYSIS, ROUTINE W REFLEX MICROSCOPIC
Bilirubin Urine: NEGATIVE
Glucose, UA: NEGATIVE
Ketones, ur: NEGATIVE
Leukocytes, UA: NEGATIVE
Nitrite: NEGATIVE
Protein, ur: NEGATIVE
Specific Gravity, Urine: 1.011
Urobilinogen, UA: 0.2
pH: 7.5

## 2010-12-18 LAB — CBC
HCT: 40.7
Hemoglobin: 13.2
MCHC: 32.4
MCV: 85.6
Platelets: 334
RBC: 4.75
RDW: 13.8
WBC: 7.7

## 2010-12-18 LAB — RAPID STREP SCREEN (MED CTR MEBANE ONLY): Streptococcus, Group A Screen (Direct): NEGATIVE

## 2010-12-18 LAB — URINE MICROSCOPIC-ADD ON

## 2010-12-27 ENCOUNTER — Encounter: Payer: Self-pay | Admitting: Gastroenterology

## 2011-01-05 ENCOUNTER — Ambulatory Visit (HOSPITAL_BASED_OUTPATIENT_CLINIC_OR_DEPARTMENT_OTHER)
Admission: RE | Admit: 2011-01-05 | Discharge: 2011-01-05 | Disposition: A | Discharge status: Home / Self Care | Payer: 59 | Source: Ambulatory Visit | Attending: Family | Admitting: Family

## 2011-01-05 ENCOUNTER — Encounter: Payer: Self-pay | Admitting: Family

## 2011-01-05 ENCOUNTER — Telehealth: Payer: Self-pay | Admitting: Family

## 2011-01-05 ENCOUNTER — Ambulatory Visit (INDEPENDENT_AMBULATORY_CARE_PROVIDER_SITE_OTHER): Payer: 59 | Admitting: Family

## 2011-01-05 VITALS — BP 100/60 | HR 66 | Temp 98.1°F | Resp 16 | Ht 62.01 in | Wt 106.0 lb

## 2011-01-05 DIAGNOSIS — R569 Unspecified convulsions: Secondary | ICD-10-CM

## 2011-01-05 DIAGNOSIS — F419 Anxiety disorder, unspecified: Secondary | ICD-10-CM

## 2011-01-05 DIAGNOSIS — F32A Depression, unspecified: Secondary | ICD-10-CM

## 2011-01-05 DIAGNOSIS — F341 Dysthymic disorder: Secondary | ICD-10-CM

## 2011-01-05 DIAGNOSIS — M79606 Pain in leg, unspecified: Secondary | ICD-10-CM

## 2011-01-05 DIAGNOSIS — J45909 Unspecified asthma, uncomplicated: Secondary | ICD-10-CM

## 2011-01-05 DIAGNOSIS — M79609 Pain in unspecified limb: Secondary | ICD-10-CM | POA: Insufficient documentation

## 2011-01-05 DIAGNOSIS — F445 Conversion disorder with seizures or convulsions: Secondary | ICD-10-CM

## 2011-01-05 DIAGNOSIS — M7989 Other specified soft tissue disorders: Secondary | ICD-10-CM

## 2011-01-05 MED ORDER — OMEPRAZOLE 20 MG PO CPDR: 20.0000 mg | DELAYED_RELEASE_CAPSULE | Freq: Two times a day (BID) | ORAL | Status: DC

## 2011-01-05 MED ORDER — HYDROXYZINE PAMOATE 25 MG PO CAPS: 25.0000 mg | ORAL_CAPSULE | Freq: Four times a day (QID) | ORAL | Status: DC | PRN

## 2011-01-05 NOTE — Telephone Encounter (Signed)
Spoke to pt. Reviewed normal doppler.  Recommended PRN tylenol and ice for pain.  Pt to call if symptoms worsen. She verbalizes understanding.

## 2011-01-05 NOTE — Patient Instructions (Signed)
Complete your ultrasound on the first floor.  You will be contacted about your referral to psychiatry. Follow up in 1 month.

## 2011-01-05 NOTE — Progress Notes (Signed)
Subjective:    Patient ID: Ruth Bell, female    DOB: 24-Aug-1960, 50 y.o.   MRN: [id number]  HPI  Ms.  Bell is a 51 yr old female who presents today for follow up and to address some left leg pain. She tells me that she was hospitalized in August and ultimately was sent to a rehabilitation center "to learn how to walk again."  Hospital records are reviewed.  She was initially admitted on 8/11 for flank pain and UTI.  She then suffered some seizure activity during the hospitalization.  She was consulted by Neuro and underwent an EEG.  It was determined that her seizure activity was actually pseudoseizures.    1. Asthma- She reports that this is well controlled.   2. Depression- she tells me that she has seeing psychiatry.  She reports that her depression is worse. Not sleeping well.  She reports that she is motivated to start her day.    3. Pseudoseizures- she was evaluated at [facility] by neurology. She had a negative EEG. She followed by Dr. Hyacinth Meeker neurology.    4. Left leg pain-  Notes that she has pain for several weeks with associated swelling.  Review of Systems See HPI  Past Medical History  Diagnosis Date  . Seizures     history of pseudoseizure disorder  . Migraine, unspecified, without mention of intractable migraine without mention of status migrainosus   . Anemia   . Menometrorrhagia 2006  . Uterine leiomyoma 2006    History   Social History  . Marital Status: Married    Spouse Name: Ruth Bell    Number of Children: N/A  . Years of Education: N/A   Occupational History  . CMA    Social History Main Topics  . Smoking status: Former Smoker -- 35 years    Types: Cigarettes  . Smokeless tobacco: Not on file  . Alcohol Use: No  . Drug Use: Not on file  . Sexually Active: Not on file   Other Topics Concern  . Not on file   Social History Narrative  . No narrative on file    Past Surgical History  Procedure Date  . Appendectomy   . Bilateral  salpingoophorectomy 04/06/04  . Abdominal hysterectomy 04/06/04  . Rotator cuff repair     Family History  Problem Relation Age of Onset  . Diabetes Other   . Hypertension Other     No Known Allergies  Current Outpatient Prescriptions on File Prior to Visit  Medication Sig Dispense Refill  . albuterol (PROVENTIL,VENTOLIN) 90 MCG/ACT inhaler Inhale 2 puffs into the lungs every 6 (six) hours as needed.          BP 100/60  Pulse 66  Temp(Src) 98.1 F (36.7 C) (Oral)  Resp 16  Ht 5' 2.01" (1.575 m)  Wt 106 lb (48.081 kg)  BMI 19.38 kg/m2  SpO2 100%       Objective:   Physical Exam  Constitutional: She appears well-developed and well-nourished.  Cardiovascular: Normal rate and regular rhythm.   No murmur heard. Pulmonary/Chest: Effort normal and breath sounds normal. No respiratory distress. She has no wheezes. She has no rales. She exhibits no tenderness.  Musculoskeletal:       + swelling noted behind the left knee. No significant swelling noted of the left lower extremity.    Skin: Skin is warm and dry.  Psychiatric: She has a normal mood and affect. Her behavior is normal. Judgment and thought content  normal.          Assessment & Plan:

## 2011-01-09 DIAGNOSIS — M79606 Pain in leg, unspecified: Secondary | ICD-10-CM | POA: Insufficient documentation

## 2011-01-09 DIAGNOSIS — J45909 Unspecified asthma, uncomplicated: Secondary | ICD-10-CM | POA: Insufficient documentation

## 2011-01-09 NOTE — Assessment & Plan Note (Signed)
Pt notes recent worsening of her depression.  Will refer her to psychiatry.

## 2011-01-09 NOTE — Assessment & Plan Note (Signed)
Stable on PRN albuterol. 

## 2011-01-09 NOTE — Assessment & Plan Note (Signed)
She is followed by Dr. Hyacinth Meeker of neurology.

## 2011-01-09 NOTE — Assessment & Plan Note (Signed)
New. Due to recent hospitalization and immobility a lower extremity doppler was performed which was negative for DVT or baker's cyst.  Likely OA.

## 2011-01-16 ENCOUNTER — Telehealth: Payer: Self-pay | Admitting: *Deleted

## 2011-01-16 ENCOUNTER — Encounter: Payer: Self-pay | Admitting: Family

## 2011-01-16 NOTE — Telephone Encounter (Signed)
See letter.

## 2011-01-16 NOTE — Telephone Encounter (Signed)
Received call from Constance Holster at Dr Monia Pouch' office (oral surgeon). She states pt is scheduled for a tooth extraction on Friday. Dentist is requesting clearance for sedation?  Pt told them she had taken Coumadin in the past but did not know why she was put on it or when she stopped it. Dentist wants to know what she took med for and when was it stopped? Please advise.

## 2011-01-17 NOTE — Telephone Encounter (Signed)
Letter faxed to attn: Victorino Dike @ [phone].

## 2011-02-02 ENCOUNTER — Ambulatory Visit (HOSPITAL_BASED_OUTPATIENT_CLINIC_OR_DEPARTMENT_OTHER)
Admission: RE | Admit: 2011-02-02 | Discharge: 2011-02-02 | Disposition: A | Discharge status: Home / Self Care | Payer: 59 | Source: Ambulatory Visit | Attending: Family | Admitting: Family

## 2011-02-02 ENCOUNTER — Encounter: Payer: Self-pay | Admitting: Family

## 2011-02-02 ENCOUNTER — Ambulatory Visit (INDEPENDENT_AMBULATORY_CARE_PROVIDER_SITE_OTHER): Payer: 59 | Admitting: Family

## 2011-02-02 ENCOUNTER — Telehealth: Payer: Self-pay | Admitting: Family

## 2011-02-02 VITALS — BP 140/70 | HR 84 | Temp 97.8°F | Resp 16 | Ht 62.0 in | Wt 107.1 lb

## 2011-02-02 DIAGNOSIS — IMO0002 Reserved for concepts with insufficient information to code with codable children: Secondary | ICD-10-CM

## 2011-02-02 DIAGNOSIS — F445 Conversion disorder with seizures or convulsions: Secondary | ICD-10-CM

## 2011-02-02 DIAGNOSIS — M5410 Radiculopathy, site unspecified: Secondary | ICD-10-CM | POA: Insufficient documentation

## 2011-02-02 DIAGNOSIS — Z1231 Encounter for screening mammogram for malignant neoplasm of breast: Secondary | ICD-10-CM

## 2011-02-02 DIAGNOSIS — Z0000 Encounter for general adult medical examination without abnormal findings: Secondary | ICD-10-CM

## 2011-02-02 DIAGNOSIS — R569 Unspecified convulsions: Secondary | ICD-10-CM

## 2011-02-02 MED ORDER — MELOXICAM 7.5 MG PO TABS: 7.5000 mg | ORAL_TABLET | Freq: Every day | ORAL | Status: DC

## 2011-02-02 NOTE — Patient Instructions (Addendum)
Please call Dr. Carie Caddy office (psychiatry) to schedule appointment. [phone] You will be contacted about your MRI to evaluate your back.   Schedule your mammogram on the first floor.  Follow up in 1 month.

## 2011-02-02 NOTE — Progress Notes (Signed)
Subjective:    Patient ID: Ruth Bell, female    DOB: 08/29/1960, 50 y.o.   MRN: [id number]  HPI  Ruth Bell is a 50 yr old female who presents today for follow up.  Right thigh "burning" like needles since Tuesday. She has had weekness in her legs for several weeks. She is using ibuprofen without improvement in her symptoms. She denies back pain but reports some weakness in the legs. As a result she has been using her walker again.   LLE pain-  Improved.  No pain in 2-3 weeks. LE doppler was negative for bakers cyst or dvt.  Depression-  She reports that she did not schedule apt with psychiatry.  She is appealing her disability which was denied.    Pseudoseizures- denies recent seizure activity.    Review of Systems See HPI  Past Medical History  Diagnosis Date  . Seizures     history of pseudoseizure disorder  . Migraine, unspecified, without mention of intractable migraine without mention of status migrainosus   . Anemia   . Menometrorrhagia 2006  . Uterine leiomyoma 2006    History   Social History  . Marital Status: Married    Spouse Name: Ruth Bell    Number of Children: N/A  . Years of Education: N/A   Occupational History  . CMA    Social History Main Topics  . Smoking status: Former Smoker -- 35 years    Types: Cigarettes  . Smokeless tobacco: Not on file  . Alcohol Use: No  . Drug Use: Not on file  . Sexually Active: Not on file   Other Topics Concern  . Not on file   Social History Narrative  . No narrative on file    Past Surgical History  Procedure Date  . Appendectomy   . Bilateral salpingoophorectomy 04/06/04  . Abdominal hysterectomy 04/06/04  . Rotator cuff repair     Family History  Problem Relation Age of Onset  . Diabetes Other   . Hypertension Other     No Known Allergies  Current Outpatient Prescriptions on File Prior to Visit  Medication Sig Dispense Refill  . citalopram (CELEXA) 40 MG tablet Take 40 mg by mouth daily.         Marland Kitchen gabapentin (NEURONTIN) 600 MG tablet Take 600 mg by mouth 3 (three) times daily.        . nicotine (NICODERM CQ - DOSED IN MG/24 HOURS) 21 mg/24hr patch Place 1 patch onto the skin daily.        Marland Kitchen omeprazole (PRILOSEC) 20 MG capsule Take 1 capsule (20 mg total) by mouth 2 (two) times daily before a meal.  60 capsule  2    BP 140/70  Pulse 84  Temp(Src) 97.8 F (36.6 C) (Oral)  Resp 16  Ht 5\' 2"  (1.575 m)  Wt 107 lb 1.3 oz (48.571 kg)  BMI 19.59 kg/m2       Objective:   Physical Exam  Constitutional: She appears well-developed and well-nourished.  Eyes: Conjunctivae are normal.  Cardiovascular: Normal rate and regular rhythm.   No murmur heard. Pulmonary/Chest: Effort normal and breath sounds normal. No respiratory distress. She has no wheezes. She has no rales.  Musculoskeletal: She exhibits no edema.  Neurological:  Reflex Scores:      Patellar reflexes are 2+ on the right side and 3+ on the left side.      RLE strength is 4/5 LLE strength is 4-5/5  Assessment & Plan:

## 2011-02-02 NOTE — Assessment & Plan Note (Signed)
This has been well controlled and she is instructed to arrange an appointment with psychiatry.  Number was provided today.

## 2011-02-02 NOTE — Assessment & Plan Note (Signed)
Given abnormal LE weakness, paresthesias and abnormal LE reflexes will refer for MRI of the L-spine and change ibuprofen to mobic.

## 2011-02-02 NOTE — Telephone Encounter (Signed)
Spoke with [employer] re: prior Auth for MRI of Lspine.  Prior Auth was obtained: # [id number]

## 2011-02-05 ENCOUNTER — Encounter: Payer: Self-pay | Admitting: *Deleted

## 2011-02-05 NOTE — Telephone Encounter (Signed)
Opened in error

## 2011-02-06 ENCOUNTER — Ambulatory Visit (HOSPITAL_BASED_OUTPATIENT_CLINIC_OR_DEPARTMENT_OTHER)
Admission: RE | Admit: 2011-02-06 | Discharge: 2011-02-06 | Disposition: A | Discharge status: Home / Self Care | Payer: 59 | Source: Ambulatory Visit | Attending: Family | Admitting: Family

## 2011-02-06 DIAGNOSIS — R209 Unspecified disturbances of skin sensation: Secondary | ICD-10-CM

## 2011-02-06 DIAGNOSIS — M545 Low back pain, unspecified: Secondary | ICD-10-CM | POA: Insufficient documentation

## 2011-02-06 DIAGNOSIS — M79609 Pain in unspecified limb: Secondary | ICD-10-CM

## 2011-02-06 DIAGNOSIS — M5410 Radiculopathy, site unspecified: Secondary | ICD-10-CM

## 2011-03-06 ENCOUNTER — Ambulatory Visit (INDEPENDENT_AMBULATORY_CARE_PROVIDER_SITE_OTHER): Payer: 59 | Admitting: Family

## 2011-03-06 ENCOUNTER — Encounter: Payer: Self-pay | Admitting: Family

## 2011-03-06 VITALS — BP 144/76 | HR 78 | Temp 97.8°F | Resp 16 | Ht 62.0 in | Wt 118.1 lb

## 2011-03-06 DIAGNOSIS — F445 Conversion disorder with seizures or convulsions: Secondary | ICD-10-CM

## 2011-03-06 DIAGNOSIS — F419 Anxiety disorder, unspecified: Secondary | ICD-10-CM

## 2011-03-06 DIAGNOSIS — F32A Depression, unspecified: Secondary | ICD-10-CM

## 2011-03-06 DIAGNOSIS — M79609 Pain in unspecified limb: Secondary | ICD-10-CM

## 2011-03-06 DIAGNOSIS — M79606 Pain in leg, unspecified: Secondary | ICD-10-CM

## 2011-03-06 DIAGNOSIS — R569 Unspecified convulsions: Secondary | ICD-10-CM

## 2011-03-06 DIAGNOSIS — Z23 Encounter for immunization: Secondary | ICD-10-CM

## 2011-03-06 DIAGNOSIS — F341 Dysthymic disorder: Secondary | ICD-10-CM

## 2011-03-06 NOTE — Progress Notes (Signed)
Subjective:    Patient ID: Ruth Bell, female    DOB: 11-18-1960, 50 y.o.   MRN: [id number]  HPI  Ms.  Bell is a 50 yr old female who presents today for follow up.  Anxiety-  Had a lot of stress.  Overall improved.  Continues citalopram.    Pseudoseizures- she has not had any further episodes.    Radiculopathy of leg- reolved.  Reports that the meloxicam "was a miracle."     Review of Systems    see HPI  Past Medical History  Diagnosis Date  . Seizures     history of pseudoseizure disorder  . Migraine, unspecified, without mention of intractable migraine without mention of status migrainosus   . Anemia   . Menometrorrhagia 2006  . Uterine leiomyoma 2006    History   Social History  . Marital Status: Married    Spouse Name: [name]    Number of Children: N/A  . Years of Education: N/A   Occupational History  . CMA    Social History Main Topics  . Smoking status: Former Smoker -- 35 years    Types: Cigarettes  . Smokeless tobacco: Not on file  . Alcohol Use: No  . Drug Use: Not on file  . Sexually Active: Not on file   Other Topics Concern  . Not on file   Social History Narrative  . No narrative on file    Past Surgical History  Procedure Date  . Appendectomy   . Bilateral salpingoophorectomy 04/06/04  . Abdominal hysterectomy 04/06/04  . Rotator cuff repair     Family History  Problem Relation Age of Onset  . Diabetes Other   . Hypertension Other     No Known Allergies  Current Outpatient Prescriptions on File Prior to Visit  Medication Sig Dispense Refill  . citalopram (CELEXA) 40 MG tablet Take 40 mg by mouth daily.        Marland Kitchen gabapentin (NEURONTIN) 600 MG tablet Take 600 mg by mouth 3 (three) times daily.        . meloxicam (MOBIC) 7.5 MG tablet Take 1 tablet (7.5 mg total) by mouth daily.  15 tablet  0  . omeprazole (PRILOSEC) 20 MG capsule Take 1 capsule (20 mg total) by mouth 2 (two) times daily before a meal.  60 capsule  2  .  promethazine (PHENERGAN) 25 MG tablet Take 25 mg by mouth every 6 (six) hours as needed.        . Tapentadol HCl (NUCYNTA) 75 MG TABS Take 1 tablet by mouth 2 (two) times daily as needed.        . nicotine (NICODERM CQ - DOSED IN MG/24 HOURS) 21 mg/24hr patch Place 1 patch onto the skin daily.          BP 144/76  Pulse 78  Temp(Src) 97.8 F (36.6 C) (Oral)  Resp 16  Ht 5\' 2"  (1.575 m)  Wt 118 lb 1.3 oz (53.561 kg)  BMI 21.60 kg/m2    Objective:   Physical Exam  Constitutional: She appears well-developed and well-nourished. No distress.  HENT:  Head: Normocephalic and atraumatic.  Cardiovascular: Normal rate and regular rhythm.   No murmur heard. Pulmonary/Chest: Effort normal and breath sounds normal. No respiratory distress. She has no wheezes. She has no rales. She exhibits no tenderness.  Skin: Skin is warm and dry.  Psychiatric: She has a normal mood and affect. Her behavior is normal. Judgment and thought content normal.  Assessment & Plan:

## 2011-03-06 NOTE — Patient Instructions (Signed)
Call Dr. Ardell Isaacs office to schedule your follow up colonoscopy [phone]. Please follow up in 6 months.

## 2011-03-07 NOTE — Assessment & Plan Note (Signed)
Improved and stable on citalopram. Continue same.

## 2011-03-07 NOTE — Assessment & Plan Note (Signed)
She has had no further seizure activity.  Perhaps having her depression/anxiety under better control is helping with her pseudoseizures.

## 2011-03-07 NOTE — Assessment & Plan Note (Signed)
Resolved. Monitor.  

## 2011-04-15 ENCOUNTER — Other Ambulatory Visit: Payer: Self-pay

## 2011-04-15 ENCOUNTER — Emergency Department (HOSPITAL_COMMUNITY)
Admission: EM | Admit: 2011-04-15 | Discharge: 2011-04-15 | Disposition: A | Discharge status: Home / Self Care | Payer: 59 | Attending: Emergency Medicine | Admitting: Emergency Medicine

## 2011-04-15 ENCOUNTER — Encounter (HOSPITAL_COMMUNITY): Payer: Self-pay | Admitting: *Deleted

## 2011-04-15 DIAGNOSIS — G43909 Migraine, unspecified, not intractable, without status migrainosus: Secondary | ICD-10-CM | POA: Insufficient documentation

## 2011-04-15 DIAGNOSIS — Z79899 Other long term (current) drug therapy: Secondary | ICD-10-CM | POA: Insufficient documentation

## 2011-04-15 DIAGNOSIS — R569 Unspecified convulsions: Secondary | ICD-10-CM

## 2011-04-15 DIAGNOSIS — R0789 Other chest pain: Secondary | ICD-10-CM | POA: Insufficient documentation

## 2011-04-15 LAB — BASIC METABOLIC PANEL
BUN: 8 mg/dL (ref 6–23)
CO2: 28 mEq/L (ref 19–32)
Calcium: 10 mg/dL (ref 8.4–10.5)
Chloride: 103 mEq/L (ref 96–112)
Creatinine, Ser: 0.49 mg/dL — ABNORMAL LOW (ref 0.50–1.10)
GFR calc Af Amer: >90 mL/min (ref >90)
GFR calc non Af Amer: >90 mL/min (ref >90)
Glucose, Bld: 114 mg/dL — ABNORMAL HIGH (ref 70–99)
Potassium: 3.5 mEq/L (ref 3.5–5.1)
Sodium: 141 mEq/L (ref 135–145)

## 2011-04-15 LAB — PHENYTOIN LEVEL, TOTAL: Phenytoin Lvl: <2.5 ug/mL — ABNORMAL LOW (ref 10.0–20.0)

## 2011-04-15 LAB — GLUCOSE, CAPILLARY: Glucose-Capillary: 95 mg/dL (ref 70–99)

## 2011-04-15 MED ORDER — OXYCODONE-ACETAMINOPHEN 5-325 MG PO TABS
1.0000 | ORAL_TABLET | Freq: Once | ORAL | Status: AC
  Administered 2011-04-15: 1 via ORAL
  Filled 2011-04-15: qty 1

## 2011-04-15 MED ORDER — PHENYTOIN 50 MG PO CHEW: 300.0000 mg | CHEWABLE_TABLET | Freq: Once | ORAL | Status: DC

## 2011-04-15 MED ORDER — PHENYTOIN SODIUM EXTENDED 100 MG PO CAPS
300.0000 mg | ORAL_CAPSULE | Freq: Once | ORAL | Status: AC
  Administered 2011-04-15: 300 mg via ORAL
  Filled 2011-04-15: qty 3

## 2011-04-15 MED ORDER — PHENYTOIN SODIUM EXTENDED 100 MG PO CAPS: ORAL_CAPSULE | ORAL | Status: DC

## 2011-04-15 NOTE — ED Notes (Signed)
Lab called about dilantin level

## 2011-04-15 NOTE — ED Notes (Signed)
Pt reports she was at church today and had a sharp pain in her chest without radiation and then tried to go back to her seat and now does not remember the rest. No history of chest pain or cardiac issues. Pt reports pain is also under her left arm.  8/10 pain that comes and goes. Pain lasting for 10-15 minutes.  Pt says she also had seizures because her speech has changed. Pt reports treating her seizures with gabapentin.  Pt denies headache. No oral trauma or incontinence noted.

## 2011-04-15 NOTE — ED Notes (Signed)
Pt attempted to to get out of bed to urinate. Pt said she felt faint and then had to lay down. Pt had an episode of urinary incontinence and was cleaned up and new bed sheets applied. Pt in no apparent distress with family at bedside. Pt remains on monitor

## 2011-04-15 NOTE — ED Notes (Signed)
Pt from church where members told EMS the patient was having "Silent Seizures."  Pt has a history of pseudoseizure activity. Pt seen by EMS having two of these episodes while en route. Pt has no oral trauma, or episodes of incontinence. Pt responding to EMS during these episodes with corneal reflex present. Pt has IV to left wrist. NS en route. Pt CBG 96.

## 2011-04-15 NOTE — ED Notes (Signed)
[id number]<AV> Expected date:04/15/11<BR> Expected time:12:54 PM<BR> Means of arrival:Ambulance<BR> Comments:<BR> EMS seizure with history  51 yo

## 2011-04-15 NOTE — ED Notes (Signed)
MD [facility] aware of incontinence episode and patient feeling faint

## 2011-04-15 NOTE — ED Notes (Signed)
Pt would like for her husband [name] to be called.  He can be reached at [phone].

## 2011-04-15 NOTE — ED Provider Notes (Signed)
Dilantin level was subtherapeutic. Patient was orally loaded. She was given 300 mg by mouth here. She was discharged with instructions to take 300 mg at 11 PM as well as 400 mg at 1 AM. She is to return to her normal dosing tomorrow. Patient was discharged in good condition.  Cyndra Numbers, MD 04/15/11 2036

## 2011-04-15 NOTE — ED Notes (Signed)
Seizure pads applied to bed. Suction available. Environment secure

## 2011-04-15 NOTE — ED Provider Notes (Addendum)
History     CSN: [id number]  Arrival date & time 04/15/11  1302   First MD Initiated Contact with Patient 04/15/11 1322      Chief Complaint  Patient presents with  . Seizures    Hx of pseudoseizures  . Chest Pain    Patient is a 51 y.o. female presenting with seizures. The history is provided by the patient and a friend.  Seizures  Associated symptoms include chest pain.   the patient was brought to the ER by EMS for several seizures.  Friends report the patient had several episodes of stiffening and seizure-like activity with her eyes rolled back in her head.  He reports she was postictal.  She has a history of seizures as well as pseudoseizures.  Review of records demonstrate no antiseizure medicines however the patient reports she is on Dilantin as prescribed by her neurologist in [facility].  She reports compliance with her medications.  She denies recent head trauma.  She denies fevers or chills.  Family and friends report she is a current baseline mental status.  The patient had no urinary incontinence or tongue biting.  The patient does admit to "stress related seizures".  Patient reports when she woke from her seizure she had pain in her chest that was worse by palpation and movement.  She reports this always occurs after her seizures.  Past Medical History  Diagnosis Date  . Seizures     history of pseudoseizure disorder  . Migraine, unspecified, without mention of intractable migraine without mention of status migrainosus   . Anemia   . Menometrorrhagia 2006  . Uterine leiomyoma 2006    Past Surgical History  Procedure Date  . Appendectomy   . Bilateral salpingoophorectomy 04/06/04  . Abdominal hysterectomy 04/06/04  . Rotator cuff repair     Family History  Problem Relation Age of Onset  . Diabetes Other   . Hypertension Other     History  Substance Use Topics  . Smoking status: Former Smoker -- 35 years    Types: Cigarettes  . Smokeless tobacco: Not on file    . Alcohol Use: No    OB History    Grav Para Term Preterm Abortions TAB SAB Ect Mult Living                  Review of Systems  Cardiovascular: Positive for chest pain.  Neurological: Positive for seizures.  All other systems reviewed and are negative.    Allergies  Review of patient's allergies indicates no known allergies.  Home Medications   Current Outpatient Rx  Name Route Sig Dispense Refill  . CITALOPRAM HYDROBROMIDE 40 MG PO TABS Oral Take 40 mg by mouth daily.      Marland Kitchen GABAPENTIN 600 MG PO TABS Oral Take 600 mg by mouth 3 (three) times daily.      . MELOXICAM 7.5 MG PO TABS Oral Take 1 tablet (7.5 mg total) by mouth daily. 15 tablet 0  . NICOTINE 21 MG/24HR TD PT24 Transdermal Place 1 patch onto the skin daily.      Marland Kitchen OMEPRAZOLE 20 MG PO CPDR Oral Take 1 capsule (20 mg total) by mouth 2 (two) times daily before a meal. 60 capsule 2  . PROMETHAZINE HCL 25 MG PO TABS Oral Take 25 mg by mouth every 6 (six) hours as needed. For nausea.    Marland Kitchen TAPENTADOL HCL 75 MG PO TABS Oral Take 1 tablet by mouth 2 (two) times daily as  needed. For pain.      BP 115/77  Pulse 91  Temp(Src) 98.2 F (36.8 C) (Oral)  Resp 18  SpO2 100%  Physical Exam  Nursing note and vitals reviewed. Constitutional: She is oriented to person, place, and time. She appears well-developed and well-nourished. No distress.  HENT:  Head: Normocephalic and atraumatic.  Eyes: EOM are normal. Pupils are equal, round, and reactive to light.  Neck: Normal range of motion.  Cardiovascular: Normal rate, regular rhythm and normal heart sounds.   Pulmonary/Chest: Effort normal and breath sounds normal.       Chest wall tenderness on examination.  No crepitus.  Abdominal: Soft. She exhibits no distension. There is no tenderness.  Musculoskeletal: Normal range of motion.  Neurological: She is alert and oriented to person, place, and time.       5/5 strength in major muscle groups of  bilateral upper and lower  extremities. Speech normal. No facial asymetry.   Skin: Skin is warm and dry.  Psychiatric: She has a normal mood and affect. Judgment normal.    ED Course  Procedures (including critical care time)   Date: 04/15/2011  Rate: 83  Rhythm: normal sinus rhythm  QRS Axis: normal  Intervals: normal  ST/T Wave abnormalities: normal  Conduction Disutrbances:none  Narrative Interpretation:   Old EKG Reviewed: No prior ecg     Labs Reviewed  BASIC METABOLIC PANEL - Abnormal; Notable for the following:    Glucose, Bld 114 (*)    Creatinine, Ser 0.49 (*)    All other components within normal limits  GLUCOSE, CAPILLARY  POCT CBG MONITORING  PHENYTOIN LEVEL, TOTAL   No results found.   No diagnosis found.    MDM  I suspect these are pseudoseizures.  The patient's EKG is normal.  Her chest pain is reproducible and seems to be musculoskeletal.  It's unclear to me whether or not the patient really does take Dilantin.  Dilantin level pending.  The patient is adamant that she takes Dilantin and thus I have checked a dilantin level. If the level comes back normal I would orally load this patient, and refer her back to her neurologist.  Care to Dr Winfield Cunas, MD 04/15/11 1647  Lyanne Co, MD 04/15/11 [phone]

## 2011-04-15 NOTE — ED Notes (Signed)
Lab reports blood was not transported as a STAT lab run and the blood is on the way to [facility]'s lab now.  Lab reports it will take about 30 minutes for blood work to be run. Pt aware she is waiting on this level and given a timeframe. Pt has family at bedside and was given a sprite while she waits. Pt in no distress

## 2011-04-15 NOTE — ED Notes (Signed)
Plan of care discussed with patient.  Pt wants an additional percocet. Will ask MD

## 2011-04-16 ENCOUNTER — Telehealth: Payer: Self-pay | Admitting: Family

## 2011-04-16 NOTE — Telephone Encounter (Signed)
Pls call pt and arrange a 1 week post ED follow up visit.  

## 2011-04-17 NOTE — Telephone Encounter (Signed)
Pt notified and follow up scheduled for 04/24/11 at 3:15pm.

## 2011-04-23 ENCOUNTER — Ambulatory Visit (INDEPENDENT_AMBULATORY_CARE_PROVIDER_SITE_OTHER): Payer: 59 | Admitting: Family

## 2011-04-23 ENCOUNTER — Encounter: Payer: Self-pay | Admitting: Family

## 2011-04-23 VITALS — BP 120/80 | HR 84 | Temp 98.2°F | Resp 16 | Wt 117.0 lb

## 2011-04-23 DIAGNOSIS — F445 Conversion disorder with seizures or convulsions: Secondary | ICD-10-CM

## 2011-04-23 DIAGNOSIS — R569 Unspecified convulsions: Secondary | ICD-10-CM

## 2011-04-23 DIAGNOSIS — M7918 Myalgia, other site: Secondary | ICD-10-CM

## 2011-04-23 DIAGNOSIS — IMO0001 Reserved for inherently not codable concepts without codable children: Secondary | ICD-10-CM

## 2011-04-23 DIAGNOSIS — R0789 Other chest pain: Secondary | ICD-10-CM | POA: Insufficient documentation

## 2011-04-23 MED ORDER — PHENYTOIN SODIUM EXTENDED 100 MG PO CAPS: 100.0000 mg | ORAL_CAPSULE | Freq: Three times a day (TID) | ORAL | Status: DC

## 2011-04-23 NOTE — Patient Instructions (Signed)
Please complete your lab work prior to leaving today.  You will be contacted about your appointment with Dr. Hyacinth Meeker.

## 2011-04-23 NOTE — Assessment & Plan Note (Signed)
Pt s/p multiple seizures in the ED.  It is unclear if these are pseudoseizures, however she did report loss of bladder control. She was started on dilantin in the ED.  Will obtain dilantin level today and then plan to continue at maintenance dose. Will arrange follow up with Dr. Hyacinth Meeker and defer further management to neurology.

## 2011-04-23 NOTE — Progress Notes (Signed)
Subjective:    Patient ID: Ruth Bell, female    DOB: Oct 23, 1960, 51 y.o.   MRN: [id number]  HPI  Ms.  Bell is a 51 yr old female who presents today for ED follow up.  She was seen in the ED on 1/27 for seizure activity.  She reports that she had 2 seizures in the ambulance preceded by 3 seizures in church.  She reports that she had bladder incontinence during the seizure.  She was given rx for phenytoin.  She sees Dr. Royston Sinner in [place].  She reports that she is seeing him every 6 months- but thinks that she is due to see him again.   She reports a "light" seizure this AM.  She reports that she felt shakey.  She reports that she has had some left sided chest tenderness.   Review of Systems See HPI  + HA today.  Past Medical History  Diagnosis Date  . Seizures     history of pseudoseizure disorder  . Migraine, unspecified, without mention of intractable migraine without mention of status migrainosus   . Anemia   . Menometrorrhagia 2006  . Uterine leiomyoma 2006    History   Social History  . Marital Status: Married    Spouse Name: [name]    Number of Children: N/A  . Years of Education: N/A   Occupational History  . CMA    Social History Main Topics  . Smoking status: Former Smoker -- 35 years    Types: Cigarettes  . Smokeless tobacco: Not on file  . Alcohol Use: No  . Drug Use: Not on file  . Sexually Active: Not on file   Other Topics Concern  . Not on file   Social History Narrative  . No narrative on file    Past Surgical History  Procedure Date  . Appendectomy   . Bilateral salpingoophorectomy 04/06/04  . Abdominal hysterectomy 04/06/04  . Rotator cuff repair     Family History  Problem Relation Age of Onset  . Diabetes Other   . Hypertension Other     No Known Allergies  Current Outpatient Prescriptions on File Prior to Visit  Medication Sig Dispense Refill  . citalopram (CELEXA) 40 MG tablet Take 40 mg by mouth daily.        Marland Kitchen gabapentin  (NEURONTIN) 600 MG tablet Take 600 mg by mouth 3 (three) times daily.        . meloxicam (MOBIC) 7.5 MG tablet Take 1 tablet (7.5 mg total) by mouth daily.  15 tablet  0  . nicotine (NICODERM CQ - DOSED IN MG/24 HOURS) 21 mg/24hr patch Place 1 patch onto the skin daily.        Marland Kitchen omeprazole (PRILOSEC) 20 MG capsule Take 1 capsule (20 mg total) by mouth 2 (two) times daily before a meal.  60 capsule  2  . phenytoin (DILANTIN) 100 MG ER capsule Take 4 tablets po at 11:00 pm and 4 tablets by mouth at 1:00 AM.  7 capsule  0  . promethazine (PHENERGAN) 25 MG tablet Take 25 mg by mouth every 6 (six) hours as needed. For nausea.      . Tapentadol HCl (NUCYNTA) 75 MG TABS Take 1 tablet by mouth 2 (two) times daily as needed. For pain.        BP 120/80  Pulse 84  Temp(Src) 98.2 F (36.8 C) (Oral)  Resp 16  Wt 117 lb (53.071 kg)  SpO2 99%  Objective:   Physical Exam  Constitutional: She is oriented to person, place, and time. She appears well-developed and well-nourished. No distress.  Eyes: EOM are normal.  Cardiovascular: Normal rate and regular rhythm.   No murmur heard. Pulmonary/Chest: Effort normal and breath sounds normal. No respiratory distress. She has no wheezes. She has no rales. She exhibits no tenderness.  Musculoskeletal: She exhibits no edema.       Left anterior chest and left shoulder tender to touch.   Neurological: She is alert and oriented to person, place, and time. She exhibits normal muscle tone.  Skin: Skin is warm and dry.  Psychiatric: She has a normal mood and affect. Her behavior is normal. Judgment and thought content normal.          Assessment & Plan:

## 2011-04-23 NOTE — Assessment & Plan Note (Signed)
Likely secondary to recent seizure activity. I recommended tylenol PRN.

## 2011-04-24 LAB — PHENYTOIN LEVEL, TOTAL: Phenytoin Lvl: 4 ug/mL — ABNORMAL LOW (ref 10.0–20.0)

## 2011-05-14 ENCOUNTER — Encounter: Payer: Self-pay | Admitting: Gastroenterology

## 2011-05-18 DIAGNOSIS — D126 Benign neoplasm of colon, unspecified: Secondary | ICD-10-CM

## 2011-05-18 HISTORY — DX: Benign neoplasm of colon, unspecified: D12.6

## 2011-05-22 ENCOUNTER — Ambulatory Visit (AMBULATORY_SURGERY_CENTER): Payer: 59

## 2011-05-22 ENCOUNTER — Encounter: Payer: Self-pay | Admitting: Gastroenterology

## 2011-05-22 VITALS — Ht 62.0 in | Wt 115.8 lb

## 2011-05-22 DIAGNOSIS — Z800 Family history of malignant neoplasm of digestive organs: Secondary | ICD-10-CM

## 2011-05-22 DIAGNOSIS — Z1211 Encounter for screening for malignant neoplasm of colon: Secondary | ICD-10-CM

## 2011-05-22 MED ORDER — PEG-KCL-NACL-NASULF-NA ASC-C 100 G PO SOLR: 1.0000 | Freq: Once | ORAL | Status: AC

## 2011-06-04 ENCOUNTER — Telehealth: Payer: Self-pay | Admitting: *Deleted

## 2011-06-04 NOTE — Telephone Encounter (Signed)
Moved patient to an earlier available appointment time.  Pt instructed to arrive at 1000 for an 1100 appointment.

## 2011-06-05 ENCOUNTER — Encounter: Payer: 59 | Admitting: Gastroenterology

## 2011-06-05 ENCOUNTER — Ambulatory Visit (AMBULATORY_SURGERY_CENTER): Payer: 59 | Admitting: Gastroenterology

## 2011-06-05 ENCOUNTER — Encounter: Payer: Self-pay | Admitting: Gastroenterology

## 2011-06-05 VITALS — BP 148/99 | HR 85 | Temp 96.2°F | Resp 19 | Ht 62.0 in | Wt 115.0 lb

## 2011-06-05 DIAGNOSIS — Z1211 Encounter for screening for malignant neoplasm of colon: Secondary | ICD-10-CM

## 2011-06-05 DIAGNOSIS — Z800 Family history of malignant neoplasm of digestive organs: Secondary | ICD-10-CM

## 2011-06-05 DIAGNOSIS — D126 Benign neoplasm of colon, unspecified: Secondary | ICD-10-CM

## 2011-06-05 MED ORDER — SODIUM CHLORIDE 0.9 % IV SOLN: 500.0000 mL | INTRAVENOUS | Status: DC

## 2011-06-05 NOTE — Op Note (Addendum)
 [facility] [address] [place], [place]  [address]  COLONOSCOPY PROCEDURE REPORT PATIENT:  Ruth Bell, Ruth Bell  MR#:  [id number] BIRTHDATE:  [birth year], 50 yrs. old  GENDER:  female ENDOSCOPIST:  Judie Petit T. Russella Dar, MD, [facility]  PROCEDURE DATE:  06/05/2011 PROCEDURE:  Colonoscopy with biopsy, snare polypectomy, and submucosal injection ASA CLASS:  Class II INDICATIONS:  1) Elevated Risk Screening  2) family history of colon cancer: father at 20. MEDICATIONS:   MAC sedation, administered by CRNA, propofol (Diprivan) 330 mg IV DESCRIPTION OF PROCEDURE:   After the risks benefits and alternatives of the procedure were thoroughly explained, informed consent was obtained.  Digital rectal exam was performed and revealed no abnormalities.   The LB [id number]-[id number] endoscope was introduced through the anus and advanced to the cecum, which was identified by both the appendix and ileocecal valve, without limitations.  The quality of the prep was excellent, using MoviPrep.  The instrument was then slowly withdrawn as the colon was fully examined. <<PROCEDUREIMAGES>> FINDINGS:  Appendiceal stump in the cecum. It was 10 mm in size. Multiple biopsies were obtained and sent to pathology.  A sessile polyp was found in the proximal transverse colon. It was 4 mm in size. The polyp was removed using cold biopsy forceps.  A sessile polyp was found in the distal transverse colon. It was 14 mm in size. Polyp was snared, then cauterized with monopolar cautery. Retrieval was successful. Piecemeal polypectomy.  Submucosal injection with 2 cc of [place] ink in 2 sites: 1 proximal and 1 distal to the polypectomy  site. Otherwise normal colonoscopy without other polyps, masses, vascular ectasias, or inflammatory changes. Retroflexion did not reveal any abnormalities. The time to cecum =  3  minutes. The scope was then withdrawn (time = 16.25  min) from the patient and the procedure  completed.  COMPLICATIONS:  None  ENDOSCOPIC IMPRESSION: 1) Appendiceal stump in the cecum 2) 4 mm sessile polyp in the proximal transverse colon 3) 14 mm sessile polyp in the distal transverse colon  RECOMMENDATIONS: 1) Hold aspirin, aspirin products, and anti-inflammatory medication for 2 weeks. 2) Await pathology results 3) Repeat Colonoscopy in 1 year pending path review  Taralee Marcus T. Russella Dar, MD, [facility]  n. REVISED:  06/11/2011 09:29 PM eSIGNED:   Venita Lick. Jamisha Hoeschen at 06/11/2011 09:29 PM  Jerre Simon, [phone]

## 2011-06-05 NOTE — Patient Instructions (Signed)
Findings Today: Polyps  Recommendations:  Hold Aspirin and Aspirin Products for two weeks.  Use only tylenol for OTC pain medication.                                   Repeat Colonoscopy in 1 yr  YOU HAD AN ENDOSCOPIC PROCEDURE TODAY AT THE [facility]: Refer to the procedure report that was given to you for any specific questions about what was found during the examination.  If the procedure report does not answer your questions, please call your gastroenterologist to clarify.  If you requested that your care partner not be given the details of your procedure findings, then the procedure report has been included in a sealed envelope for you to review at your convenience later.  YOU SHOULD EXPECT: Some feelings of bloating in the abdomen. Passage of more gas than usual.  Walking can help get rid of the air that was put into your GI tract during the procedure and reduce the bloating. If you had a lower endoscopy (such as a colonoscopy or flexible sigmoidoscopy) you may notice spotting of blood in your stool or on the toilet paper. If you underwent a bowel prep for your procedure, then you may not have a normal bowel movement for a few days.  DIET: Your first meal following the procedure should be a light meal and then it is ok to progress to your normal diet.  A half-sandwich or bowl of soup is an example of a good first meal.  Heavy or fried foods are harder to digest and may make you feel nauseous or bloated.  Likewise meals heavy in dairy and vegetables can cause extra gas to form and this can also increase the bloating.  Drink plenty of fluids but you should avoid alcoholic beverages for 24 hours.  ACTIVITY: Your care partner should take you home directly after the procedure.  You should plan to take it easy, moving slowly for the rest of the day.  You can resume normal activity the day after the procedure however you should NOT DRIVE or use heavy machinery for 24 hours (because of the  sedation medicines used during the test).    SYMPTOMS TO REPORT IMMEDIATELY: A gastroenterologist can be reached at any hour.  During normal business hours, 8:30 AM to 5:00 PM Monday through Friday, call [phone].  After hours and on weekends, please call the GI answering service at [phone] who will take a message and have the physician on call contact you.   Following lower endoscopy (colonoscopy or flexible sigmoidoscopy):  Excessive amounts of blood in the stool  Significant tenderness or worsening of abdominal pains  Swelling of the abdomen that is new, acute  Fever of 100F or higher  Following upper endoscopy (EGD)  Vomiting of blood or coffee ground material  New chest pain or pain under the shoulder blades  Painful or persistently difficult swallowing  New shortness of breath  Fever of 100F or higher  Black, tarry-looking stools  FOLLOW UP: If any biopsies were taken you will be contacted by phone or by letter within the next 1-3 weeks.  Call your gastroenterologist if you have not heard about the biopsies in 3 weeks.  Our staff will call the home number listed on your records the next business day following your procedure to check on you and address any questions or concerns that you may  have at that time regarding the information given to you following your procedure. This is a courtesy call and so if there is no answer at the home number and we have not heard from you through the emergency physician on call, we will assume that you have returned to your regular daily activities without incident.  SIGNATURES/CONFIDENTIALITY: You and/or your care partner have signed paperwork which will be entered into your electronic medical record.  These signatures attest to the fact that that the information above on your After Visit Summary has been reviewed and is understood.  Full responsibility of the confidentiality of this discharge information lies with you and/or your  care-partner.   Please follow all discharge instructions given to you by the recovery room nurse. If you have any questions or problems after discharge please call one of the numbers listed above. You will receive a phone call in the am to see how you are doing and answer any questions you may have. Thank you for choosing [facility] for your health care needs.

## 2011-06-05 NOTE — Progress Notes (Signed)
Patient did not experience any of the following events: a burn prior to discharge; a fall within the facility; wrong site/side/patient/procedure/implant event; or a hospital transfer or hospital admission upon discharge from the facility. (G8907) Patient did not have preoperative order for IV antibiotic SSI prophylaxis. (G8918)  

## 2011-06-05 NOTE — Progress Notes (Signed)
PROPOFOL PER S CAMP CRNA. SEE SCANNED INTRA PROCEDURE REPORT. EWM 

## 2011-06-06 ENCOUNTER — Inpatient Hospital Stay (HOSPITAL_COMMUNITY): Payer: 59

## 2011-06-06 ENCOUNTER — Encounter: Payer: Self-pay | Admitting: Physician Assistant

## 2011-06-06 ENCOUNTER — Inpatient Hospital Stay (HOSPITAL_COMMUNITY)
Admission: AD | Admit: 2011-06-06 | Discharge: 2011-06-13 | DRG: 395 | Disposition: A | Discharge status: Home / Self Care | Payer: 59 | Source: Ambulatory Visit | Attending: Gastroenterology | Admitting: Gastroenterology

## 2011-06-06 ENCOUNTER — Encounter (HOSPITAL_COMMUNITY): Payer: Self-pay | Admitting: Anesthesiology

## 2011-06-06 ENCOUNTER — Ambulatory Visit (INDEPENDENT_AMBULATORY_CARE_PROVIDER_SITE_OTHER): Payer: 59 | Admitting: Physician Assistant

## 2011-06-06 ENCOUNTER — Telehealth: Payer: Self-pay

## 2011-06-06 ENCOUNTER — Encounter (HOSPITAL_COMMUNITY): Payer: Self-pay | Admitting: Surgery

## 2011-06-06 ENCOUNTER — Encounter (HOSPITAL_COMMUNITY)
Admission: AD | Disposition: A | Discharge status: Home / Self Care | Payer: Self-pay | Source: Ambulatory Visit | Attending: Gastroenterology

## 2011-06-06 VITALS — BP 124/80 | HR 88 | Temp 98.3°F | Ht 62.0 in | Wt 115.1 lb

## 2011-06-06 DIAGNOSIS — K219 Gastro-esophageal reflux disease without esophagitis: Secondary | ICD-10-CM | POA: Diagnosis present

## 2011-06-06 DIAGNOSIS — Y838 Other surgical procedures as the cause of abnormal reaction of the patient, or of later complication, without mention of misadventure at the time of the procedure: Secondary | ICD-10-CM | POA: Diagnosis present

## 2011-06-06 DIAGNOSIS — R1084 Generalized abdominal pain: Secondary | ICD-10-CM

## 2011-06-06 DIAGNOSIS — F1210 Cannabis abuse, uncomplicated: Secondary | ICD-10-CM | POA: Diagnosis present

## 2011-06-06 DIAGNOSIS — F329 Major depressive disorder, single episode, unspecified: Secondary | ICD-10-CM | POA: Diagnosis present

## 2011-06-06 DIAGNOSIS — D126 Benign neoplasm of colon, unspecified: Secondary | ICD-10-CM

## 2011-06-06 DIAGNOSIS — G8918 Other acute postprocedural pain: Secondary | ICD-10-CM | POA: Diagnosis present

## 2011-06-06 DIAGNOSIS — IMO0002 Reserved for concepts with insufficient information to code with codable children: Secondary | ICD-10-CM | POA: Diagnosis present

## 2011-06-06 DIAGNOSIS — I10 Essential (primary) hypertension: Secondary | ICD-10-CM | POA: Diagnosis present

## 2011-06-06 DIAGNOSIS — Z800 Family history of malignant neoplasm of digestive organs: Secondary | ICD-10-CM

## 2011-06-06 DIAGNOSIS — R109 Unspecified abdominal pain: Secondary | ICD-10-CM | POA: Diagnosis present

## 2011-06-06 DIAGNOSIS — E876 Hypokalemia: Secondary | ICD-10-CM | POA: Diagnosis present

## 2011-06-06 DIAGNOSIS — Z860100 Personal history of colon polyps, unspecified: Secondary | ICD-10-CM

## 2011-06-06 DIAGNOSIS — D72829 Elevated white blood cell count, unspecified: Secondary | ICD-10-CM | POA: Diagnosis present

## 2011-06-06 DIAGNOSIS — F3289 Other specified depressive episodes: Secondary | ICD-10-CM | POA: Diagnosis present

## 2011-06-06 DIAGNOSIS — K635 Polyp of colon: Secondary | ICD-10-CM

## 2011-06-06 DIAGNOSIS — G43909 Migraine, unspecified, not intractable, without status migrainosus: Secondary | ICD-10-CM | POA: Diagnosis present

## 2011-06-06 DIAGNOSIS — M129 Arthropathy, unspecified: Secondary | ICD-10-CM | POA: Diagnosis present

## 2011-06-06 DIAGNOSIS — F411 Generalized anxiety disorder: Secondary | ICD-10-CM | POA: Diagnosis present

## 2011-06-06 DIAGNOSIS — Z79899 Other long term (current) drug therapy: Secondary | ICD-10-CM

## 2011-06-06 DIAGNOSIS — K929 Disease of digestive system, unspecified: Principal | ICD-10-CM | POA: Diagnosis present

## 2011-06-06 DIAGNOSIS — J45909 Unspecified asthma, uncomplicated: Secondary | ICD-10-CM | POA: Diagnosis present

## 2011-06-06 DIAGNOSIS — F17200 Nicotine dependence, unspecified, uncomplicated: Secondary | ICD-10-CM | POA: Diagnosis present

## 2011-06-06 DIAGNOSIS — R569 Unspecified convulsions: Secondary | ICD-10-CM | POA: Diagnosis present

## 2011-06-06 HISTORY — DX: Essential (primary) hypertension: I10

## 2011-06-06 LAB — CBC
HCT: 38.1 % (ref 36.0–46.0)
Hemoglobin: 12.8 g/dL (ref 12.0–15.0)
MCH: 27.2 pg (ref 26.0–34.0)
MCHC: 33.6 g/dL (ref 30.0–36.0)
MCV: 80.9 fL (ref 78.0–100.0)
Platelets: 338 10*3/uL (ref 150–400)
RBC: 4.71 MIL/uL (ref 3.87–5.11)
RDW: 14.3 % (ref 11.5–15.5)
WBC: 8 10*3/uL (ref 4.0–10.5)

## 2011-06-06 LAB — COMPREHENSIVE METABOLIC PANEL
ALT: 9 U/L (ref 0–35)
AST: 16 U/L (ref 0–37)
Albumin: 4.2 g/dL (ref 3.5–5.2)
Alkaline Phosphatase: 67 U/L (ref 39–117)
BUN: 8 mg/dL (ref 6–23)
CO2: 30 mEq/L (ref 19–32)
Calcium: 9.8 mg/dL (ref 8.4–10.5)
Chloride: 101 mEq/L (ref 96–112)
Creatinine, Ser: 0.56 mg/dL (ref 0.50–1.10)
GFR calc Af Amer: >90 mL/min (ref >90)
GFR calc non Af Amer: >90 mL/min (ref >90)
Glucose, Bld: 91 mg/dL (ref 70–99)
Potassium: 3.3 mEq/L — ABNORMAL LOW (ref 3.5–5.1)
Sodium: 139 mEq/L (ref 135–145)
Total Bilirubin: 0.3 mg/dL (ref 0.3–1.2)
Total Protein: 8.1 g/dL (ref 6.0–8.3)

## 2011-06-06 LAB — APTT: aPTT: 35 seconds (ref 24–37)

## 2011-06-06 LAB — TYPE AND SCREEN
ABO/RH(D): A POS
Antibody Screen: NEGATIVE

## 2011-06-06 LAB — PROTIME-INR
INR: 0.94 (ref 0.00–1.49)
Prothrombin Time: 12.8 seconds (ref 11.6–15.2)

## 2011-06-06 LAB — ABO/RH: ABO/RH(D): A POS

## 2011-06-06 SURGERY — LAPAROTOMY, EXPLORATORY
Anesthesia: General

## 2011-06-06 MED ORDER — ONDANSETRON HCL 4 MG/2ML IJ SOLN
4.0000 mg | Freq: Four times a day (QID) | INTRAMUSCULAR | Status: DC | PRN
  Administered 2011-06-06 – 2011-06-12 (×3): 4 mg via INTRAVENOUS
  Filled 2011-06-06 (×3): qty 2

## 2011-06-06 MED ORDER — HYDROMORPHONE HCL PF 1 MG/ML IJ SOLN
1.0000 mg | INTRAMUSCULAR | Status: DC | PRN
  Administered 2011-06-06 – 2011-06-09 (×5): 1 mg via INTRAVENOUS
  Filled 2011-06-06 (×5): qty 1

## 2011-06-06 MED ORDER — ONDANSETRON HCL 4 MG PO TABS: 4.0000 mg | ORAL_TABLET | Freq: Four times a day (QID) | ORAL | Status: DC | PRN

## 2011-06-06 MED ORDER — IOHEXOL 300 MG/ML  SOLN
100.0000 mL | Freq: Once | INTRAMUSCULAR | Status: AC | PRN
  Administered 2011-06-06: 80 mL via INTRAVENOUS

## 2011-06-06 MED ORDER — PANTOPRAZOLE SODIUM 40 MG IV SOLR
40.0000 mg | Freq: Every day | INTRAVENOUS | Status: DC
  Administered 2011-06-06 – 2011-06-12 (×7): 40 mg via INTRAVENOUS
  Filled 2011-06-06 (×8): qty 40

## 2011-06-06 MED ORDER — NICOTINE 21 MG/24HR TD PT24
21.0000 mg | MEDICATED_PATCH | Freq: Every day | TRANSDERMAL | Status: DC
  Administered 2011-06-06 – 2011-06-13 (×8): 21 mg via TRANSDERMAL
  Filled 2011-06-06 (×9): qty 1

## 2011-06-06 MED ORDER — PIPERACILLIN-TAZOBACTAM 3.375 G IVPB
3.3750 g | Freq: Four times a day (QID) | INTRAVENOUS | Status: DC
  Administered 2011-06-06 – 2011-06-08 (×7): 3.375 g via INTRAVENOUS
  Filled 2011-06-06 (×9): qty 50

## 2011-06-06 MED ORDER — GABAPENTIN 300 MG PO CAPS
600.0000 mg | ORAL_CAPSULE | Freq: Three times a day (TID) | ORAL | Status: DC
  Administered 2011-06-07 – 2011-06-13 (×19): 600 mg via ORAL
  Filled 2011-06-06 (×26): qty 2

## 2011-06-06 MED ORDER — CITALOPRAM HYDROBROMIDE 10 MG/5ML PO SOLN
40.0000 mg | Freq: Every day | ORAL | Status: DC
  Administered 2011-06-07 – 2011-06-13 (×7): 40 mg via ORAL
  Filled 2011-06-06 (×8): qty 20

## 2011-06-06 MED ORDER — PHENYTOIN SODIUM EXTENDED 100 MG PO CAPS
100.0000 mg | ORAL_CAPSULE | Freq: Three times a day (TID) | ORAL | Status: DC
  Administered 2011-06-07: 100 mg via ORAL
  Filled 2011-06-06 (×5): qty 1

## 2011-06-06 MED ORDER — KCL IN DEXTROSE-NACL 20-5-0.45 MEQ/L-%-% IV SOLN
INTRAVENOUS | Status: DC
  Administered 2011-06-06 – 2011-06-08 (×2): via INTRAVENOUS
  Filled 2011-06-06 (×6): qty 1000

## 2011-06-06 NOTE — Consult Note (Signed)
Reason for Consult:Possible bowel perforation Referring Physician: Rotha Cassels is an 51 y.o. female.  HPI: Patient's a 51 year old female who underwent colonoscopy yesterday. She was found to have an appendiceal stump which was biopsied M. one small 4 mm polyp in the transverse colon proximally which was removed cold biopsy and a 14 mm polyp in the distal transverse colon which was removed piecemeal and then underwent cautery she's had ongoing abdominal pain since her procedure she developed nausea and vomiting with chicken broth last night pain that become progressively worse she was admitted with abdominal distention nausea and vomiting. We were asked to see the patient in consultation. On exam she is extremely distended and tender. She has some rebound and guarding. Two-view abdomen did not reveal free air. She's been seen and examined by Dr.Hoxworth in its his opinion we should go ahead with a CT scan. She is just obtain IV access, pain medication and I medically will be given she has antibiotics ordered. Dr. Johna Sheriff Will await the CT scan findings before making any further decisions.  Past Medical History  Diagnosis Date  . Seizures     history of pseudoseizure disorder                                                                          Last Jan 2013  . Migraine, unspecified, without mention of intractable migraine without mention of status migrainosus Last 2 mo ago  . Anemia   . Menometrorrhagia 2006  . Uterine leiomyoma 2006  . Depression   . Hypertension   GERD  Past Surgical History  Procedure Date  . Appendectomy   . Bilateral salpingoophorectomy 04/06/04  . Abdominal hysterectomy 04/06/04  . Rotator cuff repair     Family History  Problem Relation Age of Onset  . Diabetes Other   . Hypertension Other   . Heart disease Mother   . Colon cancer Father     Social History:  reports that she has been smoking Cigarettes.  She has a 20 pack-year smoking history.  She has never used smokeless tobacco. She reports that she does not drink alcohol or use illicit drugs.  Allergies: No Known Allergies  Medications:  Prior to Admission:  Prescriptions prior to admission  Medication Sig Dispense Refill  . bisoprolol-hydrochlorothiazide (ZIAC) 5-6.25 MG per tablet Take 1 tablet by mouth 2 (two) times daily.       . citalopram (CELEXA) 40 MG tablet Take 40 mg by mouth every morning.       . gabapentin (NEURONTIN) 600 MG tablet Take 600 mg by mouth 3 (three) times daily.        Marland Kitchen ibuprofen (ADVIL,MOTRIN) 200 MG tablet Take 400 mg by mouth every 6 (six) hours as needed. For pain      . meloxicam (MOBIC) 7.5 MG tablet Take 1 tablet (7.5 mg total) by mouth daily.  15 tablet  0  . Multiple Vitamin (MULITIVITAMIN WITH MINERALS) TABS Take 1 tablet by mouth daily.      Marland Kitchen omeprazole (PRILOSEC) 20 MG capsule Take 20 mg by mouth at bedtime.      . phenytoin (DILANTIN) 100 MG ER capsule Take 1 capsule (100 mg total) by mouth  3 (three) times daily.  90 capsule  1  . promethazine (PHENERGAN) 25 MG tablet Take 25 mg by mouth every 6 (six) hours as needed. For nausea.      . Tapentadol HCl (NUCYNTA) 75 MG TABS Take 1 tablet by mouth 2 (two) times daily as needed. For pain.      Marland Kitchen DISCONTD: omeprazole (PRILOSEC) 20 MG capsule Take 1 capsule (20 mg total) by mouth 2 (two) times daily before a meal.  60 capsule  2  . DISCONTD: nicotine (NICODERM CQ - DOSED IN MG/24 HOURS) 21 mg/24hr patch Place 1 patch onto the skin daily.         Scheduled:   . citalopram  40 mg Oral Daily  . gabapentin  600 mg Oral TID  . nicotine  21 mg Transdermal Daily  . pantoprazole (PROTONIX) IV  40 mg Intravenous Q1200  . phenytoin  100 mg Oral TID  . piperacillin-tazobactam (ZOSYN)  IV  3.375 g Intravenous Q6H   Continuous:   . dextrose 5 % and 0.45 % NaCl with KCl 20 mEq/L     JXB:[id number], ondansetron (ZOFRAN) IV, ondansetron Anti-infectives     Start     Dose/Rate Route Frequency  Ordered Stop   06/06/11 1800   piperacillin-tazobactam (ZOSYN) IVPB 3.375 g        3.375 g 12.5 mL/hr over 240 Minutes Intravenous 4 times per day 06/06/11 1557            No results found for this or any previous visit (from the past 48 hour(s)).  No results found.  Review of Systems  Constitutional: Positive for fever and chills. Negative for weight loss and malaise/fatigue.  HENT: Negative.   Eyes: Negative.   Respiratory: Negative.   Cardiovascular: Negative.   Gastrointestinal: Positive for heartburn, nausea, vomiting and abdominal pain. Negative for diarrhea, constipation, blood in stool and melena.  Genitourinary: Negative.   Musculoskeletal: Negative.   Skin: Negative for itching and rash.  Neurological: Negative.   Endo/Heme/Allergies: Negative.   Psychiatric/Behavioral: Negative.    Blood pressure 150/81, pulse 90, temperature 98.6 F (37 C), temperature source Oral, resp. rate 16, height 5\' 2"  (1.575 m), weight 51.7 kg (113 lb 15.7 oz), SpO2 100.00%. Physical Exam  Constitutional: She is oriented to person, place, and time. She appears well-developed and well-nourished. She appears distressed (marked abdominal discomfort).  HENT:  Head: Normocephalic and atraumatic.  Nose: Nose normal.  Cardiovascular: Regular rhythm, normal heart sounds and intact distal pulses.  Exam reveals no gallop.   No murmur heard.      tachycardic  Respiratory: Effort normal and breath sounds normal. No respiratory distress. She has no wheezes. She has no rales. She exhibits no tenderness.       Hurts abdomen to take in a deep breath.  GI: She exhibits distension. She exhibits no mass. There is tenderness. There is guarding.  Musculoskeletal: She exhibits no edema and no tenderness.  Neurological: She is alert and oriented to person, place, and time. She has normal reflexes. No cranial nerve deficit.  Skin: Skin is warm and dry.  Psychiatric: She has a normal mood and affect. Her  behavior is normal. Judgment and thought content normal.    Assessment/Plan: 1. Status post colonoscopy with biopsies. Now with severe abdominal distention and pain. 2. History of seizures 3. History of migraines 4. History of anemia. 5. History of depression 6. Hypertension 7. GERD. 9. Left leg pain/arthritis.  Plan: As noted above the two-view  abdomen did not show free air. Dr. Johna Sheriff is seen the patient and were going to go ahead with a CT scan before committing to surgery. We will follow with you.      Dash Cardarelli 06/06/2011, 4:45 PM

## 2011-06-06 NOTE — Progress Notes (Signed)
Verbal orders given by PA Yehuda Mao for stat 2 view of patient's abd, orders entered Means, Amadi Frady N 06-06-11 16:30pm

## 2011-06-06 NOTE — Telephone Encounter (Signed)
Office visit today for evaluation

## 2011-06-06 NOTE — Telephone Encounter (Signed)
Spoke with patient and she states she is still having abdominal pain 8/10 and still very nauseated. Pt has not vomited since last night after eating mac n cheese. Pt has been drinking fluids and is now drinking coffee. Pt has not had any solid foods since last night. Dr. Russella Dar wants pt evaluated by Mike Gip, PA and appt made for 3:00pm today. Pt verbalized understanding.

## 2011-06-06 NOTE — Progress Notes (Signed)
Subjective:    Patient ID: Ruth Bell, female    DOB: 1961/01/03, 51 y.o.   MRN: [id number]  HPI Akasia is a pleasant 51 year old female known to Dr. Russella Dar who underwent followup colonoscopy yesterday for positive family history of colon cancer. She was found to have and appendiceal stump which was biopsied, one 14 mm polyp in the distal transverse colon which was removed by piecemeal and cautery, and tattoed ,and   one 4 mm polyp in the proximal transverse colon which was removed with cold biopsy.  Patient called this morning complaining of rather generalized lower abdominal pain as well as nausea with vomiting. She was unable to be seen this morning due to lack of transportation and comes in this afternoon for evaluation. She relates that she had abdominal pain last night and sent as her sedative had worn off tried to drink some chicken broth which she vomited back up. She says she had pain all night which became progressively worse this morning. She tried to eat and biscuit but she also vomited immediately. She says she has had some shaking chills but no fever. She says she has increased pain with any movement including coughing sneezing walking and is unable to lay on her back due to increased pain. She says she feels best lying on her side with a pillow under her abdomen. She rates her pain as an 8/10 and says it's been constant.    Review of Systems  Constitutional: Positive for chills and appetite change.  HENT: Negative.   Eyes: Negative.   Respiratory: Negative.   Cardiovascular: Negative.   Gastrointestinal: Positive for nausea, vomiting and abdominal pain.  Genitourinary: Negative.   Musculoskeletal: Negative.   Skin: Negative.   Neurological: Negative.   Hematological: Negative.   Psychiatric/Behavioral: Negative.    Outpatient Prescriptions Prior to Visit  Medication Sig Dispense Refill  . bisoprolol-hydrochlorothiazide (ZIAC) 5-6.25 MG per tablet       . citalopram  (CELEXA) 40 MG tablet Take 40 mg by mouth daily.        Marland Kitchen gabapentin (NEURONTIN) 600 MG tablet Take 600 mg by mouth 3 (three) times daily.        . meloxicam (MOBIC) 7.5 MG tablet Take 1 tablet (7.5 mg total) by mouth daily.  15 tablet  0  . nicotine (NICODERM CQ - DOSED IN MG/24 HOURS) 21 mg/24hr patch Place 1 patch onto the skin daily.        Marland Kitchen omeprazole (PRILOSEC) 20 MG capsule Take 1 capsule (20 mg total) by mouth 2 (two) times daily before a meal.  60 capsule  2  . phenytoin (DILANTIN) 100 MG ER capsule Take 1 capsule (100 mg total) by mouth 3 (three) times daily.  90 capsule  1  . promethazine (PHENERGAN) 25 MG tablet Take 25 mg by mouth every 6 (six) hours as needed. For nausea.      . Tapentadol HCl (NUCYNTA) 75 MG TABS Take 1 tablet by mouth 2 (two) times daily as needed. For pain.       No Known Allergies Patient Active Problem List  Diagnoses  . TINEA PEDIS  . ANEMIA  . Anxiety and depression  . MIGRAINE HEADACHE  . MICROSCOPIC HEMATURIA  . ARTHRITIS  . SYNCOPE  . WEIGHT LOSS  . ORTHOPNEA  . Pseudoseizure  . Asthma  . Radiculopathy of leg  . Musculoskeletal pain       Objective:   Physical Exam well-developed African American female uncomfortable crawled on  her side. Blood pressure 124/80 pulse 88 temp 98 3, HEENT; nontraumatic, normocephalic, EOMI, PERRLA sclera anicteric,neck; Supple no JVD, Cardiovascular; slightly tachycardia regular rhythm with S1-S2 no murmur gallop, Pulmonary; clear bilaterally, Abdomen; somewhat distended, bowel sounds are present but hypoactive she is diffusely tender with rather diffuse rebound, no guarding, no palpable mass or hepatosplenomegaly, Rectal; not done, patient had colonoscopy yesterday, Extremities ,no clubbing cyanosis or edema skin slightly diaphoretic, Psych; mood and affect normal and appropriate        Assessment & Plan:  #9 51 year old female status post colonoscopy with polypectomy yesterday per Dr. Russella Dar any [facility] with  complaint of acute generalized abdominal pain onset within a few hours of procedure and progressive since. She has peritoneal signs and has been vomiting. Will need to rule out colon perforation versus polypectomy thermal injury. #2 adenomatous colon polyp #3 positive family history of colon cancer patient's father #4 history of seizure disorder  Plan; patient will be admitted to [facility] for pain control, IV antibiotics, CT of the abdomen and pelvis May need surgical consult pending results of the CT. For details please see the admission history and physical

## 2011-06-06 NOTE — Telephone Encounter (Signed)
  Follow up Call-  Call back number 06/05/2011  Post procedure Call Back phone  # [phone]  Permission to leave phone message Yes     Patient questions:  Do you have a fever, pain , or abdominal swelling? yes Pain Score  8 *  Have you tolerated food without any problems? no  Have you been able to return to your normal activities? no  Do you have any questions about your discharge instructions: Diet   no Medications  no Follow up visit  no  Do you have questions or concerns about your Care? no  Actions: * If pain score is 4 or above: No action needed, pain <4.  Per the pt she c/o of abdominal pain rating it 8/10.  Abdomin is tender to touch and per the pt abdomin is swollen "a little".  She ate mac-in-cheese and drank chichen broth and vomited it up as some as it hit her stomach.  No fever or bleeding noted.  Per the pt she had no problem pre-procedure.  I advised her we would send a message to Dr. Russella Dar and call her back. maw

## 2011-06-06 NOTE — Telephone Encounter (Signed)
Left a message for patient to call me at her home and cell number. 

## 2011-06-06 NOTE — Progress Notes (Signed)
Patient ID: Ruth Bell, female   DOB: [birth year], 51 y.o.   MRN: [id number]  I have personally examined [name] and reviewed her history and personally reviewed her imaging. Please see consult note by Zola Button PA  Patient developed and somewhat diffuse abdominal pain last night the following colonoscopy and polypectomy with the largest polyp being removed with cautery in the distal transverse colon.  On examination she appears in pain. Abdomen is moderately distended and somewhat diffusely tender but more in the upper abdomen with some guarding. Her vital signs are all within normal limits.  Laboratory shows normal hemoglobin and white count.  Imaging includes plain films which were unremarkable. I reviewed her CT scan which does not show any evidence of free air or fluid to indicate perforation. There is thickening of the distal transverse colon indicating a likely partial thickness injury in this area.  Assessment and plan: Apparent partial thickness injury to the distal transverse colon post polypectomy. This does not appear to be a free perforation. The patient appears stable. I would recommend bowel rest, antibiotics and close observation for now.  Ruth Saa MD, FACS  06/06/2011, 7:09 PM

## 2011-06-06 NOTE — Patient Instructions (Signed)
You have been given a separate informational sheet regarding your tobacco use, the importance of quitting and local resources to help you quit.  

## 2011-06-06 NOTE — H&P (Signed)
Primary Care Physician:  Lemont Fillers., NP, NP Primary Gastroenterologist:  Claudette Head, MD  CHIEF COMPLAINT:  Acute severe abdominal pain post colonoscopy 06/05/2011  HPI: Ruth Bell is a 51 y.o. female known to Dr. Russella Dar. Patient underwent colonoscopy yesterday 06/05/2011 at the [facility] due to positive family history of colon cancer in her father. She was found to have an appendiceal stump which was biopsied, one small 4 mm polyp in the transverse colon proximally which was removed with cold biopsy and a 14 mm polyp in the distal transverse colon which was removed piecemeal and then with cautery.  Patient states that she started having abdominal pain last night after her sedation had worn off. She says she tried to drink chicken broth last night became nauseated and vomited. She had pain all night which became progressively worse this morning. This has been associated with abdominal distention nausea and vomiting. She says she tried to eat an egg biscuit this morning which she then vomited as well. She relates episodes of chills but no documented fever and complaints of a severe pressure-type constant lower abdominal pain radiating into her back. She says that she is very uncomfortable with walking sneezing coughing or any movement and has felt best lying down on her side. She has passed flatus, no bowel movement since procedure.   Past Medical History  Diagnosis Date  . Seizures     history of pseudoseizure disorder  . Migraine, unspecified, without mention of intractable migraine without mention of status migrainosus   . Anemia   . Menometrorrhagia 2006  . Uterine leiomyoma 2006  . Depression     Past Surgical History  Procedure Date  . Appendectomy   . Bilateral salpingoophorectomy 04/06/04  . Abdominal hysterectomy 04/06/04  . Rotator cuff repair     Prior to Admission medications   Medication Sig Start Date End Date Taking? Authorizing Provider    bisoprolol-hydrochlorothiazide [facility]) 5-6.25 MG per tablet  04/17/11  Yes Historical Provider, MD  citalopram (CELEXA) 40 MG tablet Take 40 mg by mouth daily.     Yes Historical Provider, MD  gabapentin (NEURONTIN) 600 MG tablet Take 600 mg by mouth 3 (three) times daily.     Yes Historical Provider, MD  meloxicam (MOBIC) 7.5 MG tablet Take 1 tablet (7.5 mg total) by mouth daily. 02/02/11 02/02/12 Yes Sandford Craze, NP  nicotine (NICODERM CQ - DOSED IN MG/24 HOURS) 21 mg/24hr patch Place 1 patch onto the skin daily.     Yes Historical Provider, MD  omeprazole (PRILOSEC) 20 MG capsule Take 1 capsule (20 mg total) by mouth 2 (two) times daily before a meal. 01/05/11  Yes Sandford Craze, NP  phenytoin (DILANTIN) 100 MG ER capsule Take 1 capsule (100 mg total) by mouth 3 (three) times daily. 04/23/11 04/22/12 Yes Sandford Craze, NP  promethazine (PHENERGAN) 25 MG tablet Take 25 mg by mouth every 6 (six) hours as needed. For nausea.   Yes Historical Provider, MD  Tapentadol HCl (NUCYNTA) 75 MG TABS Take 1 tablet by mouth 2 (two) times daily as needed. For pain.   Yes Historical Provider, MD    No current outpatient prescriptions on file.    Allergies as of 06/06/2011  . (No Known Allergies)    Family History  Problem Relation Age of Onset  . Diabetes Other   . Hypertension Other   . Heart disease Mother   . Colon cancer Father     History   Social History  .  Marital Status: Married    Spouse Name: [name]    Number of Children: N/A  . Years of Education: N/A   Occupational History  . CMA    Social History Main Topics  . Smoking status: Current Some Day Smoker -- 0.3 packs/day for 35 years    Types: Cigarettes  . Smokeless tobacco: Never Used  . Alcohol Use: No  . Drug Use: No  . Sexually Active: Yes    Birth Control/ Protection: None   Other Topics Concern  . Not on file   Social History Narrative  . No narrative on file    Review of Systems: Pertinent  positive and negative review of systems were noted in the above HPI section.  All other review of systems was otherwise negative.  Physical Exam: Vital signs in last 24 hours:   General:   Alert,  Well-developed, uncomfortable in no acute distress lying on her side, Blood pressure 124/80 pulse 88 temp 98 3  Head:  Normocephalic and atraumatic. Eyes:  Sclera clear, no icterus.   Conjunctiva pink. Ears:  Normal auditory acuity. Nose:  No deformity, discharge,  or lesions. Mouth:  No deformity or lesions.  Oropharynx pink & moist. Neck:  Supple; no masses or thyromegaly. Lungs:  Clear throughout to auscultation.   No wheezes, crackles, or rhonchi. No acute distress. Heart:  Regular rate and rhythm slightly tachycardia; no murmurs, clicks, rubs,  or gallops. Abdomen:  Soft, distended bowel sounds are present but hypoactive, she is diffusely tender with diffuse rebound is no palpable mass or hepatosplenomegaly Rectal:  Deferred   Msk:  Symmetrical without gross deformities. Normal posture. Pulses:  Normal pulses noted. Extremities:  Without clubbing or edema. Neurologic:  Alert and  oriented x4;  grossly normal neurologically. Skin:  Intact without significant lesions or rashes. Cervical Nodes:  No significant cervical adenopathy. Psych:  Alert and cooperative. Normal mood and affect.  Impression / Plan:   #61  51 year old female status post colonoscopy with polypectomy yesterday 06/05/2011 with removal of 2 polyps, the larger one 14 mm removed by piecemeal and cautery in the distal transverse colon which was also tattooed. Patient presents now with acute progressively severe lower abdominal pain, nausea, vomiting and peritoneal signs. Will need to rule out colon perforation post polypectomy versus post polypectomy thermal injury with ileus.  #2 positive family history of colon cancer in patient's father  #3 adenomatous colon polyps  #4 status post appendectomy  #5 pseudoseizure disorder  /seizure disorder   Plan ;  Patient is admitted to [facility] She will be kept n.p.o. except for meds, and undergo a stat CT scan of the abdomen and pelvis. Cover with IV Zosyn IV pain medication Surgical consultation    @RRHLOS @  Nollie Terlizzi  06/06/2011, 3:40 PM  I have taken a history and examined the patient. I have review the patients records. I agree with Sammuel Cooper note as outlined.  Meryl Dare, MD Clementeen Graham

## 2011-06-06 NOTE — Telephone Encounter (Signed)
When called the pt back Wed. Am colon on 06-05-11, the pt c/o abdominal pain rating it 8/10.  She said she went home and went to sleep and when she woke up she was having the abdominal pain.  Per the pt her abdomin is "a little swollen" and she said she did pass a good amount of gas after during the day Tues.  She also said her abdomin was tender to touch.  No fever or bleeding noted.  Also per the pt she ate mac-in-cheese and drank chicken broth and as soon as it hit her stomach she vomited it up.  Please advise.

## 2011-06-06 NOTE — Telephone Encounter (Signed)
I left a message on the pt's answering machine to call me back A.S.A.P. So we can get her in to be seen in the office today.  Message left on her answering machine at 09:06. Maw

## 2011-06-06 NOTE — Anesthesia Preprocedure Evaluation (Deleted)
Anesthesia Evaluation  Patient identified by MRN, date of birth, ID band Patient awake    Reviewed: Allergy & Precautions, H&P , NPO status , Patient's Chart, lab work & pertinent test results, reviewed documented beta blocker date and time   Airway Mallampati: II TM Distance: >3 FB Neck ROM: full    Dental No notable dental hx.    Pulmonary shortness of breath and with exertion, asthma ,  breath sounds clear to auscultation  Pulmonary exam normal       Cardiovascular Exercise Tolerance: Good hypertension, + Orthopnea Rhythm:regular Rate:Normal  syncope   Neuro/Psych  Headaches, Seizures -,  Depression Hx. peudoseizure disorder    GI/Hepatic negative GI ROS, Neg liver ROS,   Endo/Other  negative endocrine ROS  Renal/GU negative Renal ROS  negative genitourinary   Musculoskeletal   Abdominal   Peds  Hematology negative hematology ROS (+)   Anesthesia Other Findings   Reproductive/Obstetrics negative OB ROS                           Anesthesia Physical Anesthesia Plan  ASA: III  Anesthesia Plan: General   Post-op Pain Management:    Induction: Intravenous, Rapid sequence and Cricoid pressure planned  Airway Management Planned: Oral ETT  Additional Equipment:   Intra-op Plan:   Post-operative Plan: Extubation in OR  Informed Consent: I have reviewed the patients History and Physical, chart, labs and discussed the procedure including the risks, benefits and alternatives for the proposed anesthesia with the patient or authorized representative who has indicated his/her understanding and acceptance.   Dental Advisory Given  Plan Discussed with: CRNA and Surgeon  Anesthesia Plan Comments:         Anesthesia Quick Evaluation

## 2011-06-06 NOTE — Progress Notes (Signed)
I have taken a history, examined the patient and reviewed the chart. I agree with the extender's note, impression and recommendations.  Daveon Arpino T Vinaya Sancho MD FACG 

## 2011-06-07 ENCOUNTER — Inpatient Hospital Stay (HOSPITAL_COMMUNITY): Payer: 59

## 2011-06-07 ENCOUNTER — Other Ambulatory Visit: Payer: Self-pay

## 2011-06-07 DIAGNOSIS — R1084 Generalized abdominal pain: Secondary | ICD-10-CM

## 2011-06-07 LAB — CBC
HCT: 37.2 % (ref 36.0–46.0)
Hemoglobin: 12.5 g/dL (ref 12.0–15.0)
MCH: 26.9 pg (ref 26.0–34.0)
MCHC: 33.6 g/dL (ref 30.0–36.0)
MCV: 80.2 fL (ref 78.0–100.0)
Platelets: 355 10*3/uL (ref 150–400)
RBC: 4.64 MIL/uL (ref 3.87–5.11)
RDW: 14.2 % (ref 11.5–15.5)
WBC: 8.1 10*3/uL (ref 4.0–10.5)

## 2011-06-07 LAB — BASIC METABOLIC PANEL
BUN: 3 mg/dL — ABNORMAL LOW (ref 6–23)
CO2: 28 mEq/L (ref 19–32)
Calcium: 8.8 mg/dL (ref 8.4–10.5)
Chloride: 106 mEq/L (ref 96–112)
Creatinine, Ser: 0.59 mg/dL (ref 0.50–1.10)
GFR calc Af Amer: >90 mL/min (ref >90)
GFR calc non Af Amer: >90 mL/min (ref >90)
Glucose, Bld: 89 mg/dL (ref 70–99)
Potassium: 3.5 mEq/L (ref 3.5–5.1)
Sodium: 139 mEq/L (ref 135–145)

## 2011-06-07 LAB — PHENYTOIN LEVEL, TOTAL: Phenytoin Lvl: <2.5 ug/mL — ABNORMAL LOW (ref 10.0–20.0)

## 2011-06-07 MED ORDER — PHENYTOIN SODIUM 50 MG/ML IJ SOLN
100.0000 mg | Freq: Three times a day (TID) | INTRAMUSCULAR | Status: DC
  Administered 2011-06-07 – 2011-06-11 (×12): 100 mg via INTRAVENOUS
  Filled 2011-06-07 (×17): qty 2

## 2011-06-07 MED ORDER — SODIUM CHLORIDE 0.9 % IV SOLN
750.0000 mg | Freq: Once | INTRAVENOUS | Status: DC
  Filled 2011-06-07: qty 15

## 2011-06-07 MED ORDER — LORAZEPAM 2 MG/ML IJ SOLN
2.0000 mg | INTRAMUSCULAR | Status: DC | PRN
  Administered 2011-06-07 – 2011-06-11 (×5): 2 mg via INTRAVENOUS
  Filled 2011-06-07 (×4): qty 1

## 2011-06-07 MED ORDER — LORAZEPAM 2 MG/ML IJ SOLN
2.0000 mg | Freq: Once | INTRAMUSCULAR | Status: AC
  Administered 2011-06-07: 2 mg via INTRAVENOUS
  Filled 2011-06-07: qty 1

## 2011-06-07 MED ORDER — DIPHENHYDRAMINE HCL 50 MG/ML IJ SOLN
12.5000 mg | Freq: Four times a day (QID) | INTRAMUSCULAR | Status: DC | PRN
  Administered 2011-06-09 (×3): 12.5 mg via INTRAVENOUS
  Filled 2011-06-07 (×3): qty 1

## 2011-06-07 MED ORDER — SODIUM CHLORIDE 0.9 % IV SOLN
750.0000 mg | Freq: Once | INTRAVENOUS | Status: AC
  Administered 2011-06-07: 750 mg via INTRAVENOUS
  Filled 2011-06-07: qty 15

## 2011-06-07 MED ORDER — LORAZEPAM 2 MG/ML IJ SOLN
INTRAMUSCULAR | Status: AC
  Administered 2011-06-07: 2 mg via INTRAVENOUS
  Filled 2011-06-07: qty 1

## 2011-06-07 MED ORDER — MEPERIDINE HCL 50 MG/ML IJ SOLN
25.0000 mg | INTRAMUSCULAR | Status: DC | PRN
  Administered 2011-06-07 (×2): 25 mg via INTRAVENOUS
  Filled 2011-06-07 (×2): qty 1

## 2011-06-07 NOTE — Consult Note (Addendum)
Medical Consultation  Ruth Bell:[id number] DOB: 1960/08/19 DOA: 06/06/2011  Referring physician: PCP: Lemont Fillers., NP, NP   Date of consultation: 06/07/2011 Reason for consultation: Seizure activity Chief Complaint:  Abdominal pain.   HPI:  51 year old lady with h/o seizure, migraine, Depression, was admitted yesterday for severe abdominal pain after the colonoscopy on 3/19. Abdominal pain associated with nausea and vomiting. This morning, she had two seizure episodes associated with nausea and vomiting. Her husband had witnessed it. She had generalized seizure lasted about 5 minutes. No bladder or bowel incontinence. She had post ictal headache. No tongue biting. She reports feeling exhausted after the seizure episode. She was diagnosed with epilepsy in 2007 and has been on dilantin since then. As per the patient, her seizures are well controlled with dilantin.  Currently she reports abd pain and back pain. Nausea has improved. Denies any diarrhea, chest pain, sob, or palpitations. No syncopal episodes.   Review of Systems:   No chest pain, syncope, palpitations, hemetemesis , hematuria, abnormal masses,    Past Medical History  Diagnosis Date  . Seizures     history of pseudoseizure disorder  . Migraine, unspecified, without mention of intractable migraine without mention of status migrainosus   . Anemia   . Menometrorrhagia 2006  . Uterine leiomyoma 2006  . Depression   . Hypertension    Past Surgical History  Procedure Date  . Appendectomy   . Bilateral salpingoophorectomy 04/06/04  . Abdominal hysterectomy 04/06/04  . Rotator cuff repair    Social History:  reports that she has been smoking Cigarettes.  She has a 20 pack-year smoking history. She has never used smokeless tobacco. She reports that she does not drink alcohol or use illicit drugs.  No Known Allergies Family History  Problem Relation Age of Onset  . Diabetes Other   . Hypertension Other     . Heart disease Mother   . Colon cancer Father     Prior to Admission medications   Medication Sig Start Date End Date Taking? Authorizing Provider  bisoprolol-hydrochlorothiazide (ZIAC) 5-6.25 MG per tablet Take 1 tablet by mouth 2 (two) times daily.  04/17/11  Yes Historical Provider, MD  citalopram (CELEXA) 40 MG tablet Take 40 mg by mouth every morning.    Yes Historical Provider, MD  gabapentin (NEURONTIN) 600 MG tablet Take 600 mg by mouth 3 (three) times daily.     Yes Historical Provider, MD  ibuprofen (ADVIL,MOTRIN) 200 MG tablet Take 400 mg by mouth every 6 (six) hours as needed. For pain   Yes Historical Provider, MD  meloxicam (MOBIC) 7.5 MG tablet Take 1 tablet (7.5 mg total) by mouth daily. 02/02/11 02/02/12 Yes Sandford Craze, NP  Multiple Vitamin (MULITIVITAMIN WITH MINERALS) TABS Take 1 tablet by mouth daily.   Yes Historical Provider, MD  omeprazole (PRILOSEC) 20 MG capsule Take 20 mg by mouth at bedtime. 01/05/11  Yes Sandford Craze, NP  phenytoin (DILANTIN) 100 MG ER capsule Take 1 capsule (100 mg total) by mouth 3 (three) times daily. 04/23/11 04/22/12 Yes Sandford Craze, NP  promethazine (PHENERGAN) 25 MG tablet Take 25 mg by mouth every 6 (six) hours as needed. For nausea.   Yes Historical Provider, MD  Tapentadol HCl (NUCYNTA) 75 MG TABS Take 1 tablet by mouth 2 (two) times daily as needed. For pain.   Yes Historical Provider, MD   Physical Exam: Blood pressure 118/62, pulse 63, temperature 98.6 F (37 C), temperature source Oral, resp. rate 18,  height 5\' 2"  (1.575 m), weight 51.7 kg (113 lb 15.7 oz), SpO2 100.00%. Filed Vitals:   06/07/11 1206 06/07/11 1209 06/07/11 1224 06/07/11 1421  BP: 167/92 173/109 167/73 118/62  Pulse: 73 71 63 63  Temp: 97.7 F (36.5 C)  94.7 F (34.8 C) 98.6 F (37 C)  TempSrc: Axillary Oral Axillary Oral  Resp: 22 22 16 18   Height:      Weight:      SpO2: 100% 100% 100% 100%   Constitutional: Vital signs reviewed.  Patient  is a well-developed and well-nourished in no acute distress and cooperative with exam. Alert and oriented x3.  Head: Normocephalic and atraumatic Mouth: no erythema or exudates, MMM Eyes: PERRL, EOMI, conjunctivae normal, No scleral icterus.  Neck: Supple, Trachea midline normal ROM, No JVD, mass, thyromegaly, or carotid bruit present.  Cardiovascular: RRR, S1 normal, S2 normal, no MRG, pulses symmetric and intact bilaterally Pulmonary/Chest: CTAB, no wheezes, rales, or rhonchi Abdominal: Soft. Tender in the left lower quadrant, mildly distended, bowel sounds are normal, no masses, organomegaly, or guarding present.  Musculoskeletal: No joint deformities, erythema, or stiffness, ROM full and no nontender Hematology: no cervical, inginal, or axillary adenopathy.  Neurological: A&O x3, Strenght is normal and symmetric bilaterally, cranial nerve II-XII are grossly intact, no focal motor deficit, sensory intact to light touch bilaterally.  Skin: Warm, dry and intact. No rash, cyanosis, or clubbing.     Labs on Admission:  Basic Metabolic Panel:  Lab 06/06/11 5409  NA 139  K 3.3*  CL 101  CO2 30  GLUCOSE 91  BUN 8  CREATININE 0.56  CALCIUM 9.8  MG --  PHOS --   Liver Function Tests:  Lab 06/06/11 1648  AST 16  ALT 9  ALKPHOS 67  BILITOT 0.3  PROT 8.1  ALBUMIN 4.2   No results found for this basename: LIPASE:5,AMYLASE:5 in the last 168 hours No results found for this basename: AMMONIA:5 in the last 168 hours CBC:  Lab 06/07/11 0500 06/06/11 1648  WBC 8.1 8.0  NEUTROABS -- --  HGB 12.5 12.8  HCT 37.2 38.1  MCV 80.2 80.9  PLT 355 338   Cardiac Enzymes: No results found for this basename: CKTOTAL:5,CKMB:5,CKMBINDEX:5,TROPONINI:5 in the last 168 hours BNP: No components found with this basename: POCBNP:5 CBG: No results found for this basename: GLUCAP:5 in the last 168 hours  Radiological Exams on Admission: Ct Abdomen Pelvis W Contrast  06/06/2011  *RADIOLOGY  REPORT*  Clinical Data: Acute abdominal pain and physical signs of peritonitis.  Status post colonoscopy and polypectomy.  CT ABDOMEN AND PELVIS WITH CONTRAST  Technique:  Multidetector CT imaging of the abdomen and pelvis was performed following the standard protocol during bolus administration of intravenous contrast.  Contrast: 80mL OMNIPAQUE IOHEXOL 300 MG/ML IJ SOLN  Comparison: 10/28/2010  Findings: There is no evidence of free intraperitoneal air or other extraluminal gas collections.  No abnormal fluid collections are seen within the abdomen or pelvis.  No evidence of bowel obstruction.  An area of colonic wall thickening is seen involving the left transverse colon, and this could be due to colitis or carcinoma. No definite diverticulae seen in this region.  There is no evidence of pericolonic inflammatory changes or fluid collections.  No other areas of bowel wall thickening identified.  The abdominal parenchymal organs are normal in appearance except for several benign right renal cysts.  Gallbladder is unremarkable. No evidence of hydronephrosis.  No lymphadenopathy identified within the abdomen or pelvis.  Prior hysterectomy noted.  IMPRESSION:  1. Wall thickening involving the left transverse colon, which could be due to colitis or carcinoma.  Correlation with the recent colonoscopy results is recommended.  No evidence of bowel obstruction. 2.  No evidence of abscess or bowel perforation.  Original Report Authenticated By: Danae Orleans, M.D.   Dg Abd 2 Views  06/06/2011  *RADIOLOGY REPORT*  Clinical Data: Lower abdominal pain, nausea, vomiting  ABDOMEN - 2 VIEW  Comparison: 01/26/2004  Findings: Single surgical clip right pelvis. Bilateral pelvic phleboliths. Nonobstructive bowel gas pattern. No bowel dilatation, bowel wall thickening, or free intraperitoneal air. On the supine view a faint somewhat circular density projects over right mid abdomen and right transverse process of L3, question  external artifact or clothing, different in configuration on upright view. Bones appear demineralized. No definite acute findings identified.  IMPRESSION: Suspect external artifact right mid abdomen. Nonobstructive bowel gas pattern.  Original Report Authenticated By: Lollie Marrow, M.D.    EKG: pending.   Impression/Recommendations 1. Recurrent Seizures: restart IV Dilantin. Awaiting Phenytoin level, to do loading dose of dilantin, Replete electrolytes. Will start on IV ativan prn for seizures. Get CT HEAD without contrast and EEG.  2. Abdominal pain: treatment as per Gi and Surgery.   I will followup again in the morning. Please contact me if I can be of assistance in the meanwhile. Thank you for this consultation.  Kathlen Mody, MD  [facility] Pager [phone] 06/07/2011, 3:11 PM

## 2011-06-07 NOTE — Progress Notes (Signed)
Patient ID: Ruth Bell, female   DOB: Jan 01, 1961, 51 y.o.   MRN: [id number] [facility] Progress Note:   * No surgery date entered *  Subjective: Mental status is clear Objective: Vital signs in last 24 hours: Temp:  [97.7 F (36.5 C)-98.6 F (37 C)] 97.7 F (36.5 C) (03/21 0540) Pulse Rate:  [70-90] 85  (03/21 0540) Resp:  [16-20] 20  (03/21 0540) BP: (124-158)/(76-96) 134/78 mmHg (03/21 0540) SpO2:  [98 %-100 %] 98 % (03/21 0540) Weight:  [113 lb 15.7 oz (51.7 kg)-115 lb 2 oz (52.22 kg)] 113 lb 15.7 oz (51.7 kg) (03/20 1603)  Intake/Output from previous day: 03/20 0701 - 03/21 0700 In: 500 [I.V.:400; IV Piggyback:100] Out: 850 [Urine:850] Intake/Output this shift:    Physical Exam: Work of breathing is  normal. Patient still complaining of bilateral lower abdominal pain from the umbilicus on down. She is tender but no rebound or marked guarding is noted.  Lab Results:  Results for orders placed during the hospital encounter of 06/06/11 (from the past 48 hour(s))  CBC     Status: Normal   Collection Time   06/06/11  4:48 PM      Component Value Range Comment   WBC 8.0  4.0 - 10.5 (K/uL)    RBC 4.71  3.87 - 5.11 (MIL/uL)    Hemoglobin 12.8  12.0 - 15.0 (g/dL)    HCT 11.9  14.7 - 82.9 (%)    MCV 80.9  78.0 - 100.0 (fL)    MCH 27.2  26.0 - 34.0 (pg)    MCHC 33.6  30.0 - 36.0 (g/dL)    RDW 56.2  13.0 - 86.5 (%)    Platelets 338  150 - 400 (K/uL)   COMPREHENSIVE METABOLIC PANEL     Status: Abnormal   Collection Time   06/06/11  4:48 PM      Component Value Range Comment   Sodium 139  135 - 145 (mEq/L)    Potassium 3.3 (*) 3.5 - 5.1 (mEq/L)    Chloride 101  96 - 112 (mEq/L)    CO2 30  19 - 32 (mEq/L)    Glucose, Bld 91  70 - 99 (mg/dL)    BUN 8  6 - 23 (mg/dL)    Creatinine, Ser 7.84  0.50 - 1.10 (mg/dL)    Calcium 9.8  8.4 - 10.5 (mg/dL)    Total Protein 8.1  6.0 - 8.3 (g/dL)    Albumin 4.2  3.5 - 5.2 (g/dL)    AST 16  0 - 37 (U/L)    ALT 9  0 - 35 (U/L)     Alkaline Phosphatase 67  39 - 117 (U/L)    Total Bilirubin 0.3  0.3 - 1.2 (mg/dL)    GFR calc non Af Amer >90  >90 (mL/min)    GFR calc Af Amer >90  >90 (mL/min)   APTT     Status: Normal   Collection Time   06/06/11  4:48 PM      Component Value Range Comment   aPTT 35  24 - 37 (seconds)   PROTIME-INR     Status: Normal   Collection Time   06/06/11  4:48 PM      Component Value Range Comment   Prothrombin Time 12.8  11.6 - 15.2 (seconds)    INR 0.94  0.00 - 1.49    TYPE AND SCREEN     Status: Normal   Collection Time   06/06/11  5:15 PM      Component Value Range Comment   ABO/RH(D) A POS      Antibody Screen NEG      Sample Expiration 06/09/2011     ABO/RH     Status: Normal   Collection Time   06/06/11  5:15 PM      Component Value Range Comment   ABO/RH(D) A POS     CBC     Status: Normal   Collection Time   06/07/11  5:00 AM      Component Value Range Comment   WBC 8.1  4.0 - 10.5 (K/uL)    RBC 4.64  3.87 - 5.11 (MIL/uL)    Hemoglobin 12.5  12.0 - 15.0 (g/dL)    HCT 40.9  81.1 - 91.4 (%)    MCV 80.2  78.0 - 100.0 (fL)    MCH 26.9  26.0 - 34.0 (pg)    MCHC 33.6  30.0 - 36.0 (g/dL)    RDW 78.2  95.6 - 21.3 (%)    Platelets 355  150 - 400 (K/uL)     Radiology/Results: Ct Abdomen Pelvis W Contrast  06/06/2011  *RADIOLOGY REPORT*  Clinical Data: Acute abdominal pain and physical signs of peritonitis.  Status post colonoscopy and polypectomy.  CT ABDOMEN AND PELVIS WITH CONTRAST  Technique:  Multidetector CT imaging of the abdomen and pelvis was performed following the standard protocol during bolus administration of intravenous contrast.  Contrast: 80mL OMNIPAQUE IOHEXOL 300 MG/ML IJ SOLN  Comparison: 10/28/2010  Findings: There is no evidence of free intraperitoneal air or other extraluminal gas collections.  No abnormal fluid collections are seen within the abdomen or pelvis.  No evidence of bowel obstruction.  An area of colonic wall thickening is seen involving the left  transverse colon, and this could be due to colitis or carcinoma. No definite diverticulae seen in this region.  There is no evidence of pericolonic inflammatory changes or fluid collections.  No other areas of bowel wall thickening identified.  The abdominal parenchymal organs are normal in appearance except for several benign right renal cysts.  Gallbladder is unremarkable. No evidence of hydronephrosis.  No lymphadenopathy identified within the abdomen or pelvis.  Prior hysterectomy noted.  IMPRESSION:  1. Wall thickening involving the left transverse colon, which could be due to colitis or carcinoma.  Correlation with the recent colonoscopy results is recommended.  No evidence of bowel obstruction. 2.  No evidence of abscess or bowel perforation.  Original Report Authenticated By: Danae Orleans, M.D.   Dg Abd 2 Views  06/06/2011  *RADIOLOGY REPORT*  Clinical Data: Lower abdominal pain, nausea, vomiting  ABDOMEN - 2 VIEW  Comparison: 01/26/2004  Findings: Single surgical clip right pelvis. Bilateral pelvic phleboliths. Nonobstructive bowel gas pattern. No bowel dilatation, bowel wall thickening, or free intraperitoneal air. On the supine view a faint somewhat circular density projects over right mid abdomen and right transverse process of L3, question external artifact or clothing, different in configuration on upright view. Bones appear demineralized. No definite acute findings identified.  IMPRESSION: Suspect external artifact right mid abdomen. Nonobstructive bowel gas pattern.  Original Report Authenticated By: Lollie Marrow, M.D.    Anti-infectives: Anti-infectives     Start     Dose/Rate Route Frequency Ordered Stop   06/06/11 1800  piperacillin-tazobactam (ZOSYN) IVPB 3.375 g       3.375 g 12.5 mL/hr over 240 Minutes Intravenous 4 times per day 06/06/11 1557  Assessment/Plan: Problem List: Patient Active Problem List  Diagnoses  . TINEA PEDIS  . ANEMIA  . Anxiety and  depression  . MIGRAINE HEADACHE  . MICROSCOPIC HEMATURIA  . ARTHRITIS  . SYNCOPE  . WEIGHT LOSS  . ORTHOPNEA  . Pseudoseizure  . Asthma  . Radiculopathy of leg  . Musculoskeletal pain  . Abdominal pain, generalized    No CT evidence last night of perforation. She does have some thickening in her sigmoid colon could be related to submucosal tear or injury but no evidence of perforation. Agree with continued antibiotics and observation. * No surgery date entered *    LOS: 1 day   Matt B. Daphine Deutscher, MD, [facility] [phone] beeper [phone]  06/07/2011 8:37 AM

## 2011-06-07 NOTE — Progress Notes (Signed)
SPoke to MD Ruth Bell concerning patients continued seizure activity, vital signs are stable, orders given for one time dose of 2 mg, and stat CT scan, pt seems to coming around, patient's husband notified of patient being transferred awaiting bed number from step down, pt sent to CT will continue to monitor patient Ruth Bell, Ruth Bell N 06-07-11 19:01pm

## 2011-06-07 NOTE — Progress Notes (Signed)
Pt had seizure that lasted about 5-6 minutes, pt placed on her side and vomited small amount of emesis, bed rail padded for safety, BP elevated while seizure occurred rechecked vital signs and they are within normal limits, MD Christella Hartigan notified and orders received to recheck Dilantin level, will continue to closely monitor patient Means, Duanne Duchesne N 06-07-11 12:36pm

## 2011-06-07 NOTE — Progress Notes (Signed)
[facility] Progress Note   Subjective: Still with abd pains, r>left but really whole abdomen hurts. She is very hungry.     Objective: Vital signs in last 24 hours: Temp:  [97.7 F (36.5 C)-98.6 F (37 C)] 97.7 F (36.5 C) (03/21 0540) Pulse Rate:  [70-90] 85  (03/21 0540) Resp:  [16-20] 20  (03/21 0540) BP: (124-158)/(76-96) 134/78 mmHg (03/21 0540) SpO2:  [98 %-100 %] 98 % (03/21 0540) Weight:  [113 lb 15.7 oz (51.7 kg)-115 lb 2 oz (52.22 kg)] 113 lb 15.7 oz (51.7 kg) (03/20 1603)   General: alert and oriented times 3 Heart: regular rate and rythm Abdomen: soft, moderately tender throughoht, non-distended, normal bowel sounds    Lab Results:  Basename 06/07/11 0500 06/06/11 1648  WBC 8.1 8.0  HGB 12.5 12.8  PLT 355 338  MCV 80.2 80.9    Basename 06/06/11 1648  NA 139  K 3.3*  CL 101  CO2 30  GLUCOSE 91  BUN 8  CREATININE 0.56  CALCIUM 9.8    Basename 06/06/11 1648  PROT 8.1  ALBUMIN 4.2  AST 16  ALT 9  ALKPHOS 67  BILITOT 0.3  BILIDIR --  IBILI --    Basename 06/06/11 1648  INR 0.94     IMPRESSION from CT last night:  1. Wall thickening involving the left transverse colon, which could be due to colitis or carcinoma. Correlation with the recent colonoscopy results is recommended. No evidence of bowel obstruction.  2. No evidence of abscess or bowel perforation.   Assessment/Plan: 51 y.o. female with post polypectomy syndrome (focal inflammation of colon at polypectomy site), no clear perforation  She is still in pain and has nausea but wants to eat.  I will advance to clears for today.  Continue IV Abx, Iv pain meds.     Rob Bunting, MD  06/07/2011, 8:20 AM [facility] Pager [phone]

## 2011-06-07 NOTE — Progress Notes (Signed)
Notified MD Blake Divine regarding patients seizure activity, seizure activity stated around 1730 and ended about 1745 twice between those 15 minutes, patient placed on side and vital taken and were normal, side rails also padded for patients safety, MD placed orders for ativan 2 mg as needed for seizure activity Means, Ruth Bell N 06-07-11 17:59pm

## 2011-06-08 ENCOUNTER — Other Ambulatory Visit (HOSPITAL_COMMUNITY): Payer: 59

## 2011-06-08 LAB — BASIC METABOLIC PANEL
BUN: 3 mg/dL — ABNORMAL LOW (ref 6–23)
CO2: 28 mEq/L (ref 19–32)
Calcium: 9.3 mg/dL (ref 8.4–10.5)
Chloride: 99 mEq/L (ref 96–112)
Creatinine, Ser: 0.63 mg/dL (ref 0.50–1.10)
GFR calc Af Amer: >90 mL/min (ref >90)
GFR calc non Af Amer: >90 mL/min (ref >90)
Glucose, Bld: 99 mg/dL (ref 70–99)
Potassium: 3.4 mEq/L — ABNORMAL LOW (ref 3.5–5.1)
Sodium: 135 mEq/L (ref 135–145)

## 2011-06-08 LAB — CBC
HCT: 38.8 % (ref 36.0–46.0)
Hemoglobin: 12.7 g/dL (ref 12.0–15.0)
MCH: 26.6 pg (ref 26.0–34.0)
MCHC: 32.7 g/dL (ref 30.0–36.0)
MCV: 81.2 fL (ref 78.0–100.0)
Platelets: 340 10*3/uL (ref 150–400)
RBC: 4.78 MIL/uL (ref 3.87–5.11)
RDW: 14.4 % (ref 11.5–15.5)
WBC: 10.8 10*3/uL — ABNORMAL HIGH (ref 4.0–10.5)

## 2011-06-08 LAB — MAGNESIUM: Magnesium: 1.8 mg/dL (ref 1.5–2.5)

## 2011-06-08 LAB — PHENYTOIN LEVEL, TOTAL: Phenytoin Lvl: 19.8 ug/mL (ref 10.0–20.0)

## 2011-06-08 MED ORDER — AMOXICILLIN-POT CLAVULANATE 875-125 MG PO TABS
1.0000 | ORAL_TABLET | Freq: Two times a day (BID) | ORAL | Status: AC
  Administered 2011-06-08 – 2011-06-11 (×8): 1 via ORAL
  Filled 2011-06-08 (×8): qty 1

## 2011-06-08 NOTE — Progress Notes (Signed)
Nutrition Brief Note:  Pt screened for nutrition risk via low braden.   Pt has known seizure disorder- has had several witnessed episodes since admission.  Pt with abdominal pain since colonoscopy (3/19).  Currently on full liquid diet.  Tolerated clear liquids with 100% consumed this am.  Pt also tried to eat solid food last (although diet order was for liquids only) and did not tolerate.  Pt not at nutrition risk at this time. Pt is thin, however BMI wnl.  Pt with good appetite and intake prior to events leading to admission.  LOS day 2.  RD to continue to follow and screen for diet advancements with tolerance and for pt to consume >50% of meals.  Pager: [phone]

## 2011-06-08 NOTE — Progress Notes (Signed)
Subjective: Pt had witnessed seizures x 2 yesterday, but no further seizure activity after loaded with Dilantin.  ABD pain may be somewhat better, but she reports still having to take IV meds for pain, but she has not gotten any IV pain medications today.  She is not passing flatus and feels nauseated currently, but did not have any emesis today and has been able to drink clear liquids.   Objective: Vital signs in last 24 hours: Temp:  [94.7 F (34.8 C)-98.6 F (37 C)] 97.7 F (36.5 C) (03/22 0455) Pulse Rate:  [63-91] 68  (03/22 0455) Resp:  [12-22] 18  (03/22 0455) BP: (104-173)/(62-109) 139/87 mmHg (03/22 0455) SpO2:  [98 %-100 %] 98 % (03/22 0455)    Intake/Output from previous day: 03/21 0701 - 03/22 0700 In: 797 [P.O.:591; IV Piggyback:206] Out: 1800 [Urine:1800] Intake/Output this shift: Total I/O In: 800 [P.O.:800] Out: 250 [Urine:250]  Resp: clear to auscultation bilaterally Cardio: regular rate and rhythm GI: soft, mildly-tender in peri-umbilical area, +BS  Lab Results:   Basename 06/08/11 0445 06/07/11 0500  WBC 10.8* 8.1  HGB 12.7 12.5  HCT 38.8 37.2  PLT 340 355   BMET  Basename 06/08/11 0445 06/07/11 1940  NA 135 139  K 3.4* 3.5  CL 99 106  CO2 28 28  GLUCOSE 99 89  BUN 3* 3*  CREATININE 0.63 0.59  CALCIUM 9.3 8.8   PT/INR  Basename 06/06/11 1648  LABPROT 12.8  INR 0.94   ABG No results found for this basename: PHART:2,PCO2:2,PO2:2,HCO3:2 in the last 72 hours  Studies/Results: Ct Head Wo Contrast  06/07/2011  *RADIOLOGY REPORT*  Clinical Data: Seizure  CT HEAD WITHOUT CONTRAST  Technique:  Contiguous axial images were obtained from the base of the skull through the vertex without contrast.  Comparison: 10/30/2010  Findings: Normal ventricular morphology. No midline shift or mass effect. Normal appearance of brain parenchyma. No intracranial hemorrhage, mass lesion, or acute infarction. Visualized paranasal sinuses and mastoid air cells  clear. Bones unremarkable.  IMPRESSION: No acute intracranial abnormalities. If patient has persistent or recurrent seizures, consider MR imaging of the brain with and without contrast for further evaluation.  Original Report Authenticated By: Lollie Marrow, M.D.   Ct Abdomen Pelvis W Contrast  06/06/2011  *RADIOLOGY REPORT*  Clinical Data: Acute abdominal pain and physical signs of peritonitis.  Status post colonoscopy and polypectomy.  CT ABDOMEN AND PELVIS WITH CONTRAST  Technique:  Multidetector CT imaging of the abdomen and pelvis was performed following the standard protocol during bolus administration of intravenous contrast.  Contrast: 80mL OMNIPAQUE IOHEXOL 300 MG/ML IJ SOLN  Comparison: 10/28/2010  Findings: There is no evidence of free intraperitoneal air or other extraluminal gas collections.  No abnormal fluid collections are seen within the abdomen or pelvis.  No evidence of bowel obstruction.  An area of colonic wall thickening is seen involving the left transverse colon, and this could be due to colitis or carcinoma. No definite diverticulae seen in this region.  There is no evidence of pericolonic inflammatory changes or fluid collections.  No other areas of bowel wall thickening identified.  The abdominal parenchymal organs are normal in appearance except for several benign right renal cysts.  Gallbladder is unremarkable. No evidence of hydronephrosis.  No lymphadenopathy identified within the abdomen or pelvis.  Prior hysterectomy noted.  IMPRESSION:  1. Wall thickening involving the left transverse colon, which could be due to colitis or carcinoma.  Correlation with the recent colonoscopy results is recommended.  No evidence of bowel obstruction. 2.  No evidence of abscess or bowel perforation.  Original Report Authenticated By: Danae Orleans, M.D.   Dg Abd 2 Views  06/06/2011  *RADIOLOGY REPORT*  Clinical Data: Lower abdominal pain, nausea, vomiting  ABDOMEN - 2 VIEW  Comparison:  01/26/2004  Findings: Single surgical clip right pelvis. Bilateral pelvic phleboliths. Nonobstructive bowel gas pattern. No bowel dilatation, bowel wall thickening, or free intraperitoneal air. On the supine view a faint somewhat circular density projects over right mid abdomen and right transverse process of L3, question external artifact or clothing, different in configuration on upright view. Bones appear demineralized. No definite acute findings identified.  IMPRESSION: Suspect external artifact right mid abdomen. Nonobstructive bowel gas pattern.  Original Report Authenticated By: Lollie Marrow, M.D.    Anti-infectives: Anti-infectives     Start     Dose/Rate Route Frequency Ordered Stop   06/08/11 1000  amoxicillin-clavulanate (AUGMENTIN) 875-125 MG per tablet 1 tablet       1 tablet Oral Every 12 hours 06/08/11 0835 06/12/11 0959   06/06/11 1800   piperacillin-tazobactam (ZOSYN) IVPB 3.375 g  Status:  Discontinued        3.375 g 12.5 mL/hr over 240 Minutes Intravenous 4 times per day 06/06/11 1557 06/08/11 0835          Assessment/Plan: s/p Procedure(s) (LRB): EXPLORATORY LAPAROTOMY (N/A) 1. Status post colonoscopy with biopsies. Now with severe abdominal distention and pain. 2. History of seizures 3. History of migraines 4. History of anemia. 5. History of depression 6. Hypertension 7. GERD. 9. Left leg pain/arthritis.    PLAN: Will continue to follow with you for signs/symptoms of perforation.  LOS: 2 days    Daesha Insco,PA-C Pager [phone] General Trauma Pager [phone]

## 2011-06-08 NOTE — Plan of Care (Signed)
Problem: Phase I Progression Outcomes Goal: EF % per last Echo/documented,Core Reminder form on chart Outcome: Completed/Met Date Met:  06/08/11 Last Echo 01/14/2008, EF 55%

## 2011-06-08 NOTE — Progress Notes (Signed)
Patient seen and examined.  No clinical evidence of colon perforation.  Will be available if needed.

## 2011-06-08 NOTE — Progress Notes (Signed)
[facility] Progress Note   Subjective: Neurology consult yesterday is very appreciated.  She was IV loaded with dilantin after level found to be undetectable and she had 2 witnessed seizures.  SHe was sleeping when I entered the room this AM, but awoke easily.  Her pain is "much better."  She did have some vomiting last night with a biscuit that she tried to eat.   Objective: Vital signs in last 24 hours: Temp:  [94.7 F (34.8 C)-98.6 F (37 C)] 97.7 F (36.5 C) (03/22 0455) Pulse Rate:  [63-91] 68  (03/22 0455) Resp:  [12-22] 18  (03/22 0455) BP: (104-173)/(62-109) 139/87 mmHg (03/22 0455) SpO2:  [98 %-100 %] 98 % (03/22 0455)   General: alert and oriented times 3 Heart: regular rate and rythm Abdomen: soft, non-tender, non-distended, normal bowel sounds    Lab Results:  Basename 06/08/11 0445 06/07/11 0500 06/06/11 1648  WBC 10.8* 8.1 8.0  HGB 12.7 12.5 12.8  PLT 340 355 338  MCV 81.2 80.2 80.9    Basename 06/08/11 0445 06/07/11 1940 06/06/11 1648  NA 135 139 139  K 3.4* 3.5 3.3*  CL 99 106 101  CO2 28 28 30   GLUCOSE 99 89 91  BUN 3* 3* 8  CREATININE 0.63 0.59 0.56  CALCIUM 9.3 8.8 9.8    Basename 06/06/11 1648  PROT 8.1  ALBUMIN 4.2  AST 16  ALT 9  ALKPHOS 67  BILITOT 0.3  BILIDIR --  IBILI --    Basename 06/06/11 1648  INR 0.94     Assessment/Plan: 51 y.o. female with post polypectomy syndrome, seizures  Her abd pain is nearly gone now. Should continue 4 more days of Abx, I changed to PO augmentin this AM.  Will advance diet as tolerated and hopefully home tomorrow.  I hep locked IV. No further seizures since dilantin load yesterday, great appreciate neuro help.     Rob Bunting, MD  06/08/2011, 7:29 AM  Gastroenterology Pager [phone]
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
## 2011-06-08 NOTE — Consult Note (Signed)
[facility] CONSULT NOTE     Reason for Consult: Generalized seizures   HPI:    Ruth Bell is an 51 y.o. female a history of seizure disorder admitted on 06/06/2011 for management of severe abdominal pain following colonoscopy on 06/05/2011. Patient had 2 witnessed generalized seizures. Dilantin level was undetectable. Patient was given Dilantin loading dose of 1 g and was started on maintenance of 100 mg 3 times a day. Repeat Dilantin level is pending. Patient has not had recurrence of seizure activity since Dilantin load. She was also given Ativan prior to Dilantin. Neurology consultation was requested for seizure management. CT scan of her head showed no acute intracranial abnormality.  Past Medical History  Diagnosis Date  . Seizures     history of pseudoseizure disorder  . Migraine, unspecified, without mention of intractable migraine without mention of status migrainosus   . Anemia   . Menometrorrhagia 2006  . Uterine leiomyoma 2006  . Depression   . Hypertension     Past Surgical History  Procedure Date  . Appendectomy   . Bilateral salpingoophorectomy 04/06/04  . Abdominal hysterectomy 04/06/04  . Rotator cuff repair     Family History  Problem Relation Age of Onset  . Diabetes Other   . Hypertension Other   . Heart disease Mother   . Colon cancer Father     Social History:  reports that she has been smoking Cigarettes.  She has a 20 pack-year smoking history. She has never used smokeless tobacco. She reports that she does not drink alcohol or use illicit drugs.  No Known Allergies  Medications:    Scheduled:   . citalopram  40 mg Oral Daily  . gabapentin  600 mg Oral TID  . LORazepam  2 mg Intravenous Once  . nicotine  21 mg Transdermal Daily  . pantoprazole (PROTONIX) IV  40 mg Intravenous Q1200  . phenytoin (DILANTIN) IV  750 mg Intravenous Once  . phenytoin (DILANTIN) IV  100 mg Intravenous Q8H  .  piperacillin-tazobactam (ZOSYN)  IV  3.375 g Intravenous Q6H  . DISCONTD: phenytoin (DILANTIN) IV  750 mg Intravenous Once  . DISCONTD: phenytoin  100 mg Oral TID   Blood pressure 127/78, pulse 70, temperature 97.5 F (36.4 C), temperature source Oral, resp. rate 16, height 5\' 2"  (1.575 m), weight 51.7 kg (113 lb 15.7 oz), SpO2 100.00%.   Neurologic Examination:  Mental Status: Somnolent but easy to arouse. Patient was well oriented to place as well as time. Speech fluent without evidence of aphasia. Able to follow commands without difficulty. Cranial Nerves: II-Visual fields were normal. III/IV/VI-Extraocular movements were full and conjugate.  Pupils reacted normally to light. V/VII-no facial numbness and no facial weakness VIII-grossly intact X-normal speech XII-midline tongue extension Motor: 5/5 bilaterally with normal tone and bulk Sensory:  light touch intact throughout, bilaterally Deep Tendon Reflexes: 2+ and symmetric throughout Plantars: Downgoing bilaterally Cerebellar: Normal finger-to-nose  Ct Head Wo Contrast  06/07/2011  *RADIOLOGY REPORT*  Clinical Data: Seizure  CT HEAD WITHOUT CONTRAST  Technique:  Contiguous axial images were obtained from the base of the skull through the vertex without contrast.  Comparison: 10/30/2010  Findings: Normal ventricular morphology. No midline shift or mass effect. Normal appearance of brain parenchyma. No intracranial hemorrhage, mass lesion, or acute infarction. Visualized paranasal sinuses and mastoid air cells clear. Bones unremarkable.  IMPRESSION: No acute intracranial abnormalities. If patient has persistent or recurrent seizures, consider MR imaging of the brain with and without contrast for further evaluation.  Original Report Authenticated By: Lollie Marrow, M.D.   Ct Abdomen Pelvis W Contrast  06/06/2011  *RADIOLOGY REPORT*  Clinical Data: Acute abdominal pain and physical signs of peritonitis.  Status post colonoscopy and  polypectomy.  CT ABDOMEN AND PELVIS WITH CONTRAST  Technique:  Multidetector CT imaging of the abdomen and pelvis was performed following the standard protocol during bolus administration of intravenous contrast.  Contrast: 80mL OMNIPAQUE IOHEXOL 300 MG/ML IJ SOLN  Comparison: 10/28/2010  Findings: There is no evidence of free intraperitoneal air or other extraluminal gas collections.  No abnormal fluid collections are seen within the abdomen or pelvis.  No evidence of bowel obstruction.  An area of colonic wall thickening is seen involving the left transverse colon, and this could be due to colitis or carcinoma. No definite diverticulae seen in this region.  There is no evidence of pericolonic inflammatory changes or fluid collections.  No other areas of bowel wall thickening identified.  The abdominal parenchymal organs are normal in appearance except for several benign right renal cysts.  Gallbladder is unremarkable. No evidence of hydronephrosis.  No lymphadenopathy identified within the abdomen or pelvis.  Prior hysterectomy noted.  IMPRESSION:  1. Wall thickening involving the left transverse colon, which could be due to colitis or carcinoma.  Correlation with the recent colonoscopy results is recommended.  No evidence of bowel obstruction. 2.  No evidence of abscess or bowel perforation.  Original Report Authenticated By: Danae Orleans, M.D.   Dg Abd 2 Views  06/06/2011  *RADIOLOGY REPORT*  Clinical Data: Lower abdominal pain, nausea, vomiting  ABDOMEN - 2 VIEW  Comparison: 01/26/2004  Findings: Single surgical clip right pelvis. Bilateral pelvic phleboliths. Nonobstructive bowel gas pattern. No bowel dilatation, bowel wall thickening, or free intraperitoneal air. On the supine view a faint somewhat circular density projects over right mid abdomen and right transverse process of L3, question external artifact or clothing, different in configuration on upright view. Bones appear demineralized. No definite  acute findings identified.  IMPRESSION: Suspect external artifact right mid abdomen. Nonobstructive bowel gas pattern.  Original Report Authenticated By: Lollie Marrow, M.D.     Assessment/Plan:  Recurrent generalized seizures, most likely secondary to poor compliance with taking anticonvulsant medication.  Recommendations: 1. Agree with restarting anticonvulsant medication with Dilantin level followed by maintenance dose of Dilantin 300 mg per day, total dose. 2. Dilantin level on 06/08/2011 3. No further neurodiagnostic studies are indicated.   Venetia Maxon M.D. [facility] [phone]  06/08/2011, 3:06 AM

## 2011-06-08 NOTE — Progress Notes (Signed)
During my assessment pt non responsive.  Appeared to be sleeping, but unarousable.  Had received Ativan IV for seizures.  Will continue to monitor.

## 2011-06-08 NOTE — Progress Notes (Signed)
Subjective: She had a seizure episode which resolved with  2 mg of ativan   Objective: Weight change:   Intake/Output Summary (Last 24 hours) at 06/08/11 1848 Last data filed at 06/08/11 1809  Gross per 24 hour  Intake   1597 ml  Output    250 ml  Net   1347 ml    Filed Vitals:   06/08/11 1409  BP: 102/68  Pulse: 98  Temp: 98.7 F (37.1 C)  Resp: 18   Pt is lethargic afebrile comfortable cvs s1s2 heard Lungs clear Abdomen soft nontender nondistended bowel sounds heard Extremities : no pedal edema.  Lab Results: Results for orders placed during the hospital encounter of 06/06/11 (from the past 24 hour(s))  BASIC METABOLIC PANEL     Status: Abnormal   Collection Time   06/07/11  7:40 PM      Component Value Range   Sodium 139  135 - 145 (mEq/L)   Potassium 3.5  3.5 - 5.1 (mEq/L)   Chloride 106  96 - 112 (mEq/L)   CO2 28  19 - 32 (mEq/L)   Glucose, Bld 89  70 - 99 (mg/dL)   BUN 3 (*) 6 - 23 (mg/dL)   Creatinine, Ser 4.09  0.50 - 1.10 (mg/dL)   Calcium 8.8  8.4 - 81.1 (mg/dL)   GFR calc non Af Amer >90  >90 (mL/min)   GFR calc Af Amer >90  >90 (mL/min)  CBC     Status: Abnormal   Collection Time   06/08/11  4:45 AM      Component Value Range   WBC 10.8 (*) 4.0 - 10.5 (K/uL)   RBC 4.78  3.87 - 5.11 (MIL/uL)   Hemoglobin 12.7  12.0 - 15.0 (g/dL)   HCT 91.4  78.2 - 95.6 (%)   MCV 81.2  78.0 - 100.0 (fL)   MCH 26.6  26.0 - 34.0 (pg)   MCHC 32.7  30.0 - 36.0 (g/dL)   RDW 21.3  08.6 - 57.8 (%)   Platelets 340  150 - 400 (K/uL)  BASIC METABOLIC PANEL     Status: Abnormal   Collection Time   06/08/11  4:45 AM      Component Value Range   Sodium 135  135 - 145 (mEq/L)   Potassium 3.4 (*) 3.5 - 5.1 (mEq/L)   Chloride 99  96 - 112 (mEq/L)   CO2 28  19 - 32 (mEq/L)   Glucose, Bld 99  70 - 99 (mg/dL)   BUN 3 (*) 6 - 23 (mg/dL)   Creatinine, Ser 4.69  0.50 - 1.10 (mg/dL)   Calcium 9.3  8.4 - 62.9 (mg/dL)   GFR calc non Af Amer >90  >90 (mL/min)   GFR calc Af Amer >90   >90 (mL/min)  MAGNESIUM     Status: Normal   Collection Time   06/08/11  4:45 AM      Component Value Range   Magnesium 1.8  1.5 - 2.5 (mg/dL)  PHENYTOIN LEVEL, TOTAL     Status: Normal   Collection Time   06/08/11  1:37 PM      Component Value Range   Phenytoin Lvl 19.8  10.0 - 20.0 (ug/mL)     Micro Results: No results found for this or any previous visit (from the past 240 hour(s)).  Studies/Results: Ct Head Wo Contrast  06/07/2011  *RADIOLOGY REPORT*  Clinical Data: Seizure  CT HEAD WITHOUT CONTRAST  Technique:  Contiguous axial images were obtained  from the base of the skull through the vertex without contrast.  Comparison: 10/30/2010  Findings: Normal ventricular morphology. No midline shift or mass effect. Normal appearance of brain parenchyma. No intracranial hemorrhage, mass lesion, or acute infarction. Visualized paranasal sinuses and mastoid air cells clear. Bones unremarkable.  IMPRESSION: No acute intracranial abnormalities. If patient has persistent or recurrent seizures, consider MR imaging of the brain with and without contrast for further evaluation.  Original Report Authenticated By: Lollie Marrow, M.D.   Ct Abdomen Pelvis W Contrast  06/06/2011  *RADIOLOGY REPORT*  Clinical Data: Acute abdominal pain and physical signs of peritonitis.  Status post colonoscopy and polypectomy.  CT ABDOMEN AND PELVIS WITH CONTRAST  Technique:  Multidetector CT imaging of the abdomen and pelvis was performed following the standard protocol during bolus administration of intravenous contrast.  Contrast: 80mL OMNIPAQUE IOHEXOL 300 MG/ML IJ SOLN  Comparison: 10/28/2010  Findings: There is no evidence of free intraperitoneal air or other extraluminal gas collections.  No abnormal fluid collections are seen within the abdomen or pelvis.  No evidence of bowel obstruction.  An area of colonic wall thickening is seen involving the left transverse colon, and this could be due to colitis or carcinoma. No  definite diverticulae seen in this region.  There is no evidence of pericolonic inflammatory changes or fluid collections.  No other areas of bowel wall thickening identified.  The abdominal parenchymal organs are normal in appearance except for several benign right renal cysts.  Gallbladder is unremarkable. No evidence of hydronephrosis.  No lymphadenopathy identified within the abdomen or pelvis.  Prior hysterectomy noted.  IMPRESSION:  1. Wall thickening involving the left transverse colon, which could be due to colitis or carcinoma.  Correlation with the recent colonoscopy results is recommended.  No evidence of bowel obstruction. 2.  No evidence of abscess or bowel perforation.  Original Report Authenticated By: Danae Orleans, M.D.   Dg Abd 2 Views  06/06/2011  *RADIOLOGY REPORT*  Clinical Data: Lower abdominal pain, nausea, vomiting  ABDOMEN - 2 VIEW  Comparison: 01/26/2004  Findings: Single surgical clip right pelvis. Bilateral pelvic phleboliths. Nonobstructive bowel gas pattern. No bowel dilatation, bowel wall thickening, or free intraperitoneal air. On the supine view a faint somewhat circular density projects over right mid abdomen and right transverse process of L3, question external artifact or clothing, different in configuration on upright view. Bones appear demineralized. No definite acute findings identified.  IMPRESSION: Suspect external artifact right mid abdomen. Nonobstructive bowel gas pattern.  Original Report Authenticated By: Lollie Marrow, M.D.   Medications: Scheduled Meds:   . amoxicillin-clavulanate  1 tablet Oral Q12H  . citalopram  40 mg Oral Daily  . gabapentin  600 mg Oral TID  . LORazepam  2 mg Intravenous Once  . nicotine  21 mg Transdermal Daily  . pantoprazole (PROTONIX) IV  40 mg Intravenous Q1200  . phenytoin (DILANTIN) IV  750 mg Intravenous Once  . phenytoin (DILANTIN) IV  100 mg Intravenous Q8H  . DISCONTD: piperacillin-tazobactam (ZOSYN)  IV  3.375 g  Intravenous Q6H   Continuous Infusions:   . DISCONTD: dextrose 5 % and 0.45 % NaCl with KCl 20 mEq/L 100 mL/hr at 06/08/11 0045   PRN Meds:.diphenhydrAMINE, HYDROmorphone, LORazepam, meperidine (DEMEROL) injection, ondansetron (ZOFRAN) IV, ondansetron  Assessment/Plan: Patient Active Hospital Problem List: Abdominal pain, generalized (06/06/2011) Resolved. Further treatment as per GI.  SEIZURES: on iv dilantin. Dilantin level is therapeutic. But she continues to have intermittent seizures . EEG  is pending. If she continues to have seizures , recommend MRI of the brain with and without contrast.  Leukocytosis; probably reactive. She is afebrile.   Will sign off as Neurology is following the patient in consult for seizures. Please call if needed.   LOS: 2 days   Kharis Lapenna 06/08/2011, 6:48 PM

## 2011-06-09 MED ORDER — LEVETIRACETAM 250 MG PO TABS
250.0000 mg | ORAL_TABLET | Freq: Two times a day (BID) | ORAL | Status: DC
  Administered 2011-06-09 – 2011-06-11 (×5): 250 mg via ORAL
  Filled 2011-06-09 (×8): qty 1

## 2011-06-09 MED ORDER — DOCUSATE SODIUM 50 MG/5ML PO LIQD
200.0000 mg | Freq: Two times a day (BID) | ORAL | Status: DC
  Administered 2011-06-09 – 2011-06-12 (×6): 200 mg via ORAL
  Filled 2011-06-09 (×9): qty 20

## 2011-06-09 NOTE — Progress Notes (Signed)
Subjective: Patient reports that she has had multiple seizures in the past 24 hours.  On review of the records she received Ativan for one at about 7pm yesterday and then was noted to have a syncopal episode while on the toilet that the patient felt was a seizure.  Currently on IV Dilantin and po Neurontin.    Objective: Current vital signs: BP 130/84  Pulse 84  Temp(Src) 98.6 F (37 C) (Oral)  Resp 16  Ht 5\' 2"  (1.575 m)  Wt 51.9 kg (114 lb 6.7 oz)  BMI 20.93 kg/m2  SpO2 96% Vital signs in last 24 hours: Temp:  [98.6 F (37 C)-98.7 F (37.1 C)] 98.6 F (37 C) (03/23 0520) Pulse Rate:  [84-98] 84  (03/23 0520) Resp:  [16-18] 16  (03/23 0520) BP: (102-130)/(68-84) 130/84 mmHg (03/23 0520) SpO2:  [96 %-99 %] 96 % (03/23 0520) Weight:  [51.9 kg (114 lb 6.7 oz)] 51.9 kg (114 lb 6.7 oz) (03/23 0500)  Intake/Output from previous day: 03/22 0701 - 03/23 0700 In: 1286 [P.O.:1280; IV Piggyback:6] Out: 1400 [Urine:1400] Intake/Output this shift: Total I/O In: 120 [P.O.:120] Out: 300 [Urine:300] Nutritional status: Cardiac  Neurologic Exam: Mental Status: Alert, oriented, thought content appropriate.  Speech fluent without evidence of aphasia.  Able to follow 3 step commands without difficulty. Cranial Nerves: II: visual fields grossly normal, pupils equal, round, reactive to light and accommodation III,IV, VI: ptosis not present, extra-ocular motions intact bilaterally V,VII: smile symmetric, facial light touch sensation normal bilaterally VIII: hearing normal bilaterally IX,X: gag reflex present XI: trapezius strength/neck flexion strength normal bilaterally XII: tongue strength normal  Motor: Right : Upper extremity   5/5    Left:     Upper extremity   5/5  Lower extremity   5/5     Lower extremity   5/5 Tone and bulk:normal tone throughout; no atrophy noted Sensory: Pinprick and light touch intact throughout, bilaterally Deep Tendon Reflexes: 2+ and symmetric  throughout Plantars: Right: downgoing   Left: downgoing Cerebellar: normal finger-to-nose  Lab Results: Results for orders placed during the hospital encounter of 06/06/11 (from the past 48 hour(s))  PHENYTOIN LEVEL, TOTAL     Status: Abnormal   Collection Time   06/07/11  1:22 PM      Component Value Range Comment   Phenytoin Lvl <2.5 (*) 10.0 - 20.0 (ug/mL)   BASIC METABOLIC PANEL     Status: Abnormal   Collection Time   06/07/11  7:40 PM      Component Value Range Comment   Sodium 139  135 - 145 (mEq/L)    Potassium 3.5  3.5 - 5.1 (mEq/L)    Chloride 106  96 - 112 (mEq/L)    CO2 28  19 - 32 (mEq/L)    Glucose, Bld 89  70 - 99 (mg/dL)    BUN 3 (*) 6 - 23 (mg/dL)    Creatinine, Ser 1.61  0.50 - 1.10 (mg/dL)    Calcium 8.8  8.4 - 10.5 (mg/dL)    GFR calc non Af Amer >90  >90 (mL/min)    GFR calc Af Amer >90  >90 (mL/min)   CBC     Status: Abnormal   Collection Time   06/08/11  4:45 AM      Component Value Range Comment   WBC 10.8 (*) 4.0 - 10.5 (K/uL)    RBC 4.78  3.87 - 5.11 (MIL/uL)    Hemoglobin 12.7  12.0 - 15.0 (g/dL)    HCT  38.8  36.0 - 46.0 (%)    MCV 81.2  78.0 - 100.0 (fL)    MCH 26.6  26.0 - 34.0 (pg)    MCHC 32.7  30.0 - 36.0 (g/dL)    RDW 16.1  09.6 - 04.5 (%)    Platelets 340  150 - 400 (K/uL)   BASIC METABOLIC PANEL     Status: Abnormal   Collection Time   06/08/11  4:45 AM      Component Value Range Comment   Sodium 135  135 - 145 (mEq/L)    Potassium 3.4 (*) 3.5 - 5.1 (mEq/L)    Chloride 99  96 - 112 (mEq/L)    CO2 28  19 - 32 (mEq/L)    Glucose, Bld 99  70 - 99 (mg/dL)    BUN 3 (*) 6 - 23 (mg/dL)    Creatinine, Ser 4.09  0.50 - 1.10 (mg/dL)    Calcium 9.3  8.4 - 10.5 (mg/dL)    GFR calc non Af Amer >90  >90 (mL/min)    GFR calc Af Amer >90  >90 (mL/min)   MAGNESIUM     Status: Normal   Collection Time   06/08/11  4:45 AM      Component Value Range Comment   Magnesium 1.8  1.5 - 2.5 (mg/dL)   PHENYTOIN LEVEL, TOTAL     Status: Normal   Collection  Time   06/08/11  1:37 PM      Component Value Range Comment   Phenytoin Lvl 19.8  10.0 - 20.0 (ug/mL)     No results found for this or any previous visit (from the past 240 hour(s)).  Lipid Panel No results found for this basename: CHOL,TRIG,HDL,CHOLHDL,VLDL,LDLCALC in the last 72 hours  Studies/Results: Ct Head Wo Contrast  06/07/2011  *RADIOLOGY REPORT*  Clinical Data: Seizure  CT HEAD WITHOUT CONTRAST  Technique:  Contiguous axial images were obtained from the base of the skull through the vertex without contrast.  Comparison: 10/30/2010  Findings: Normal ventricular morphology. No midline shift or mass effect. Normal appearance of brain parenchyma. No intracranial hemorrhage, mass lesion, or acute infarction. Visualized paranasal sinuses and mastoid air cells clear. Bones unremarkable.  IMPRESSION: No acute intracranial abnormalities. If patient has persistent or recurrent seizures, consider MR imaging of the brain with and without contrast for further evaluation.  Original Report Authenticated By: Lollie Marrow, M.D.    Medications:  I have reviewed the patient's current medications. Scheduled:   . amoxicillin-clavulanate  1 tablet Oral Q12H  . citalopram  40 mg Oral Daily  . gabapentin  600 mg Oral TID  . nicotine  21 mg Transdermal Daily  . pantoprazole (PROTONIX) IV  40 mg Intravenous Q1200  . phenytoin (DILANTIN) IV  100 mg Intravenous Q8H    Assessment/Plan:  Patient Active Hospital Problem List: Seizure disorder (06/06/2011)   Assessment: Patient on Dilantin and Neurontin.  Dilantin level subtherapeutic on the 21st and patient given an IV load.  Level yesterday was 19.8   Plan:  1.  Dilantin level in AM.  Patient on IV maintenance.  2.  Keppra 250mg  po bid to start today.     LOS: 3 days   Thana Farr, MD [facility] [phone] 06/09/2011  12:53 PM

## 2011-06-09 NOTE — Progress Notes (Signed)
Pt  Was sitting up to use urinal at 2109 and had a syncopal episode. Pt was unable to open eyes or respond and went completely limp. However, pt was breathing regularly and still had a pulse. Attempted to arouse pt at this time. Vitals taken, bp was 120/68 and pulse was 96. Pt came to after about a minute and had some tremors and slurred speech. She was completely oriented and reported that she felt like she may have had a seizure. Her speech quickly returned to baseline. Notified Dr. Roseanne Reno and he had no new orders at this time. Will continue to monitor pt.

## 2011-06-09 NOTE — Progress Notes (Signed)
Subjective CROSS COVER-[facility] Patient seems to be doing somewhat better this afternoon. She continues to have abdominal pain, that she rates at an intensity of 8/10. No further seizures, since the episode early this morning. Tolerrating clears well. She has not had a BM in 3 days.  Objective: Vital signs in last 24 hours: Temp:  [98.6 F (37 C)-98.7 F (37.1 C)] 98.7 F (37.1 C) (03/23 1350) Pulse Rate:  [83-96] 83  (03/23 1350) Resp:  [16-17] 17  (03/23 1350) BP: (120-130)/(78-84) 130/79 mmHg (03/23 1350) SpO2:  [96 %-99 %] 98 % (03/23 1350) Weight:  [51.9 kg (114 lb 6.7 oz)] 51.9 kg (114 lb 6.7 oz) (03/23 0500) Last BM Date: 06/05/11  Intake/Output from previous day: 03/22 0701 - 03/23 0700 In: 1286 [P.O.:1280; IV Piggyback:6] Out: 1400 [Urine:1400] Intake/Output this shift: Total I/O In: 120 [P.O.:120] Out: 700 [Urine:700]  General appearance: alert, cooperative, appears older than stated age and mild distress Resp: clear to auscultation bilaterally Cardio: regular rate and rhythm, S1, S2 normal, no murmur, click, rub or gallop GI: soft, non-tender; bowel sounds normal; no masses,  no organomegaly  Lab Results:  Basename 06/08/11 0445 06/07/11 0500 06/06/11 1648  WBC 10.8* 8.1 8.0  HGB 12.7 12.5 12.8  HCT 38.8 37.2 38.1  PLT 340 355 338   BMET  Basename 06/08/11 0445 06/07/11 1940 06/06/11 1648  NA 135 139 139  K 3.4* 3.5 3.3*  CL 99 106 101  CO2 28 28 30   GLUCOSE 99 89 91  BUN 3* 3* 8  CREATININE 0.63 0.59 0.56  CALCIUM 9.3 8.8 9.8   LFT  Basename 06/06/11 1648  PROT 8.1  ALBUMIN 4.2  AST 16  ALT 9  ALKPHOS 67  BILITOT 0.3  BILIDIR --  IBILI --   PT/INR  Basename 06/06/11 1648  LABPROT 12.8  INR 0.94   Studies/Results: Ct Head Wo Contrast  06/07/2011  *RADIOLOGY REPORT*  Clinical Data: Seizure  CT HEAD WITHOUT CONTRAST  Technique:  Contiguous axial images were obtained from the base of the skull through the vertex without contrast.   Comparison: 10/30/2010  Findings: Normal ventricular morphology. No midline shift or mass effect. Normal appearance of brain parenchyma. No intracranial hemorrhage, mass lesion, or acute infarction. Visualized paranasal sinuses and mastoid air cells clear. Bones unremarkable.  IMPRESSION: No acute intracranial abnormalities. If patient has persistent or recurrent seizures, consider MR imaging of the brain with and without contrast for further evaluation.  Original Report Authenticated By: Lollie Marrow, M.D.    Medications: I have reviewed the patient's current medications.  Assessment/Plan: 1) Post-polypectomy burn: Patient has been reassured about her symptoms; on Augmentin.  2) Seizures due to sub therapeutic levels of Dilantin-seems to be improving.  LOS: 3 days   Merilyn Pagan 06/09/2011, 3:16 PM

## 2011-06-10 LAB — PHENYTOIN LEVEL, TOTAL: Phenytoin Lvl: <2.5 ug/mL — ABNORMAL LOW (ref 10.0–20.0)

## 2011-06-10 MED ORDER — POLYETHYLENE GLYCOL 3350 17 G PO PACK
17.0000 g | PACK | Freq: Every day | ORAL | Status: DC | PRN
  Administered 2011-06-10 (×3): 17 g via ORAL
  Filled 2011-06-10 (×2): qty 1

## 2011-06-10 MED ORDER — ACETAMINOPHEN 325 MG PO TABS
650.0000 mg | ORAL_TABLET | Freq: Four times a day (QID) | ORAL | Status: DC | PRN
Start: 2011-06-10 — End: 2011-06-13
  Administered 2011-06-10 – 2011-06-12 (×2): 650 mg via ORAL
  Filled 2011-06-10 (×2): qty 2

## 2011-06-10 MED ORDER — SODIUM CHLORIDE 0.9 % IV SOLN
500.0000 mg | Freq: Once | INTRAVENOUS | Status: AC
  Administered 2011-06-10: 500 mg via INTRAVENOUS
  Filled 2011-06-10: qty 5

## 2011-06-10 NOTE — Progress Notes (Signed)
Pt noted to have another very mild seizure at this time, loading dose of keppra running.  Pt able to open eyes when asked.  133/80, 78, sats 100%. Family at bedside. Genia Harold

## 2011-06-10 NOTE — Progress Notes (Signed)
3 packs of miralax given and bowel sound are much more active, but belly still distended. Ruth Bell

## 2011-06-10 NOTE — Progress Notes (Signed)
Subjective: Patient felt by nursing to have a seizure this morning.  IV load of Keppra was ordered at that time.  I did not witness that seizure.  On my arrival later this morning the patient was having another "seizure".  She was moaning, pelvic thrusting, legs outstretched and trembling in unison in lower extremities.  When leg held shaking stopped.  Patient able to follow commands during "seizure" and when I attempted to pull back her covers she helped me while in the midst of her event.  Episode lasted for about 4 minutes and afterward the patient went to sleep.    Objective: Current vital signs: BP 152/92  Pulse 98  Temp(Src) 98.6 F (37 C) (Oral)  Resp 16  Ht 5\' 2"  (1.575 m)  Wt 50.5 kg (111 lb 5.3 oz)  BMI 20.36 kg/m2  SpO2 98% Vital signs in last 24 hours: Temp:  [98.6 F (37 C)-98.7 F (37.1 C)] 98.6 F (37 C) (03/24 1610) Pulse Rate:  [74-98] 98  (03/24 0842) Resp:  [16-18] 16  (03/24 0842) BP: (130-152)/(70-92) 152/92 mmHg (03/24 0842) SpO2:  [98 %-99 %] 98 % (03/24 9604) Weight:  [50.5 kg (111 lb 5.3 oz)] 50.5 kg (111 lb 5.3 oz) (03/24 5409)  Intake/Output from previous day: 03/23 0701 - 03/24 0700 In: 1080 [P.O.:1080] Out: 1400 [Urine:1400] Intake/Output this shift: Total I/O In: -  Out: 400 [Urine:400] Nutritional status: Cardiac  Neurologic Exam: Mental Status: Unresponsive. No speech. Does not follow commands. Cranial Nerves: II: Pupils equal, round, reactive to light and accommodation III,IV, VI: Absent Doll's eye response, with eyes looking upward V,VII: corneals intact bilaterally VIII: hearing normal bilaterally IX,X: gag reflex present XI: trapezius strength/neck flexion strength normal bilaterally XII: tongue strength not tested Motor: Extremities flaccid throughout Sensory: Unable to test Deep Tendon Reflexes: 2+ and symmetric throughout Plantars: Right: downgoing   Left: downgoing Cerebellar: Unable to test  Lab Results: Results for  orders placed during the hospital encounter of 06/06/11 (from the past 48 hour(s))  PHENYTOIN LEVEL, TOTAL     Status: Normal   Collection Time   06/08/11  1:37 PM      Component Value Range Comment   Phenytoin Lvl 19.8  10.0 - 20.0 (ug/mL)     No results found for this or any previous visit (from the past 240 hour(s)).  Lipid Panel No results found for this basename: CHOL,TRIG,HDL,CHOLHDL,VLDL,LDLCALC in the last 72 hours  Studies/Results: No results found.  Medications:  I have reviewed the patient's current medications. Scheduled:   . amoxicillin-clavulanate  1 tablet Oral Q12H  . citalopram  40 mg Oral Daily  . docusate  200 mg Oral BID  . gabapentin  600 mg Oral TID  . levetiracetam  500 mg Intravenous Once  . levETIRAcetam  250 mg Oral BID  . nicotine  21 mg Transdermal Daily  . pantoprazole (PROTONIX) IV  40 mg Intravenous Q1200  . phenytoin (DILANTIN) IV  100 mg Intravenous Q8H    Assessment/Plan:  Patient Active Hospital Problem List: Seizure disorder (06/06/2011)   Assessment: Patient on Dilantin and Neurontin. Keppra started po yesterday and after episode this morning an IV load administered.  Episode noted by me today reminiscent of pseudoseizure.  Would not be more aggressive with treatment other than what already ordered in response to this particular episode. Unclear at this time whether the patient has a combination of pseudoseizures and epileptic seizures or only one type.     Plan: 1. Dilantin level pending  2. After IV load continue Keppra at 250mg  po bid.     LOS: 4 days   Thana Farr, MD [facility] [phone] 06/10/2011  10:11 AM

## 2011-06-10 NOTE — Progress Notes (Signed)
Pt awake but drowsy, requesting orange juice.  States she remembers me coming into the room this morning but nothing after that.  No c/o anything but feeling tired, will continue to monitor. Ruth Bell

## 2011-06-10 NOTE — Progress Notes (Signed)
Pt had approximately 3 minute seizure.  IV was beeping and when I entered the room pt had her arm drawn up tight, moaned loudly and seizure started.  Ativan given as ordered, seizure ceased, pt post ictal now, VSS, sats 100% on 3L, Dr Loreta Ave and Dr Thad Ranger aware. Genia Harold

## 2011-06-10 NOTE — Progress Notes (Signed)
Subjective: CROSS COVER [facility] Since I last evaluated the patient, she has not had much of a change in her symptoms. She describes her abdominal pain as severe and rates it at an intensity of 8/10. She has not had a BM in several days; no results with the Colace she received last night.   Objective: Vital signs in last 24 hours: Temp:  [98.6 F (37 C)-98.7 F (37.1 C)] 98.6 F (37 C) (03/24 4098) Pulse Rate:  [74-83] 76  (03/24 0633) Resp:  [17-18] 18  (03/24 0633) BP: (130-131)/(70-79) 131/70 mmHg (03/24 0633) SpO2:  [98 %-99 %] 98 % (03/24 1191) Weight:  [50.5 kg (111 lb 5.3 oz)] 50.5 kg (111 lb 5.3 oz) (03/24 4782) Last BM Date: 06/05/11  Intake/Output from previous day: 03/23 0701 - 03/24 0700 In: 1080 [P.O.:1080] Out: 1400 [Urine:1400] Intake/Output this shift:   General appearance: alert, cooperative, appears stated age and mild distress Resp: clear to auscultation bilaterally Cardio: regular rate and rhythm, S1, S2 normal, no murmur, click, rub or gallop GI: soft, slightly distended, diffusely tender with decreased bowel sounds; no masses,  no organomegaly; she tends to guard with palpation but there is no rebound or rigidity.  Lab Results:  [facility] 06/08/11 0445  WBC 10.8*  HGB 12.7  HCT 38.8  PLT 340   BMET  Basename 06/08/11 0445 06/07/11 1940  NA 135 139  K 3.4* 3.5  CL 99 106  CO2 28 28  GLUCOSE 99 89  BUN 3* 3*  CREATININE 0.63 0.59  CALCIUM 9.3 8.8  Studies/Results: No results found.  Medications: I have reviewed the patient's current medications.  Assessment/Plan: 1) Severe constipation: Will try several doses of Miralax today.  2) Post-polypectomy pain: I do not think her pain today is due to the polypectomy.   LOS: 4 days   Ruth Bell 06/10/2011, 8:20 AM

## 2011-06-10 NOTE — Progress Notes (Signed)
Pt witnessed to moan out and then her head began to jerk towards the right. Her extremities remained stiff, pt also nonresponsive. Episode lasted about 2 minutes, then Ativan was administered and pt relaxed and fell asleep. Pt was then easily aroused and at baseline status. Pt reports that she was on the phone with her niece and then she "had a seizure". VSS, airway maintained, no injury noted. No changes on telemetry. Seizure precautions maintained, Dr Roseanne Reno notified of above, no new orders. Will monitor.

## 2011-06-11 ENCOUNTER — Inpatient Hospital Stay (HOSPITAL_COMMUNITY)
Admission: AD | Admit: 2011-06-11 | Discharge: 2011-06-11 | Disposition: A | Discharge status: Home / Self Care | Payer: 59 | Source: Ambulatory Visit | Attending: Internal Medicine | Admitting: Internal Medicine

## 2011-06-11 ENCOUNTER — Encounter: Payer: Self-pay | Admitting: Gastroenterology

## 2011-06-11 ENCOUNTER — Inpatient Hospital Stay (HOSPITAL_COMMUNITY): Payer: 59

## 2011-06-11 LAB — PHENYTOIN LEVEL, TOTAL: Phenytoin Lvl: 22.8 ug/mL — ABNORMAL HIGH (ref 10.0–20.0)

## 2011-06-11 MED ORDER — SODIUM CHLORIDE 0.9 % IV SOLN
150.0000 mg | Freq: Three times a day (TID) | INTRAVENOUS | Status: DC
  Filled 2011-06-11 (×2): qty 3

## 2011-06-11 MED ORDER — IOHEXOL 300 MG/ML  SOLN
100.0000 mL | Freq: Once | INTRAMUSCULAR | Status: AC | PRN
  Administered 2011-06-11: 100 mL via INTRAVENOUS

## 2011-06-11 MED ORDER — HYDROMORPHONE HCL PF 1 MG/ML IJ SOLN
0.5000 mg | INTRAMUSCULAR | Status: DC | PRN
  Administered 2011-06-12: 0.5 mg via INTRAVENOUS
  Filled 2011-06-11: qty 1

## 2011-06-11 MED ORDER — PHENYTOIN SODIUM EXTENDED 100 MG PO CAPS
100.0000 mg | ORAL_CAPSULE | Freq: Three times a day (TID) | ORAL | Status: DC
  Filled 2011-06-11 (×3): qty 1

## 2011-06-11 NOTE — Progress Notes (Signed)
I have reviewed the above note, examined the patient and agree with plan of treatment.  

## 2011-06-11 NOTE — Progress Notes (Signed)
Portable EEG completed.  Pt experienced ? Seizure-like activity due procedure.  RN assisted pt, MD came after EEG completed to check pt and review EEG. Pt comfortable, answers questions appropriately at this time.

## 2011-06-11 NOTE — Procedures (Signed)
EEG NUMBER:  REFERRING PHYSICIAN:  Kathlen Mody, MD  HISTORY:  A 51 year old female with history of seizures and possible pseudoseizures.  MEDICATIONS:  Celexa, Benadryl, Colace, Neurontin, Keppra, Ativan, NicoDerm, Protonix, and Dilantin.  CONDITIONS OF RECORDING:  This is a 16-channel EEG carried out with the patient in the awake and drowsy states.  DESCRIPTION:  The patient is awake briefly during the tracing and during that time, the posterior background rhythm can be noted to be 8 Hz alpha seen from the parieto-occipital and posterotemporal regions.  Due to the brief period of wakefulness, this activity is poorly sustained and the patient spends the majority of the tracing with drowse with slowing of the background rhythm.  Stage II sleep was not obtained.  The patient has 2 seizure-like episodes during the tracing.  During this time, no epileptiform activity is noted.  Hypoventilation was not performed. Intermittent photic stimulation was performed, although these seizure- like episodes occurred during this tracing, no other changes other than artifact were noted during this period of time.  IMPRESSION:  This is a normal awake and drowsy EEG.  Two clinical seizure-like episodes were captured during the tracing, but no EEG correlate was noted.          ______________________________ Thana Farr, MD    [id number] D:  06/11/2011 18:03:26  T:  06/11/2011 22:37:16  Job #:  [id number]

## 2011-06-11 NOTE — Progress Notes (Addendum)
Subjective: Patient has had multiple seizure like episodes.  On my arrival today was having multiple back-to-back.  Patient's head would drop to the side and she would become unresponsive.  Would then sit up and make a screech.  Afterward was confused.  One of these episodes was captured during EEG.  On preliminary evaluation of the EEG no correlate was seen on EEG.  Further conversation from patient reveals that she has seizure like episodes at home on a frequency of about one every other day.  She is unable to stay alone.  Started having her seizures after her mother died in 67.  Has been video monitored at [facility] per her report but I am unclear what diagnosis she was given.    Objective: Current vital signs: BP 100/65  Pulse 84  Temp(Src) 98.2 F (36.8 C) (Oral)  Resp 20  Ht 5\' 2"  (1.575 m)  Wt 51.6 kg (113 lb 12.1 oz)  BMI 20.81 kg/m2  SpO2 100% Vital signs in last 24 hours: Temp:  [97.8 F (36.6 C)-98.2 F (36.8 C)] 98.2 F (36.8 C) (03/25 0654) Pulse Rate:  [84-90] 84  (03/25 0654) Resp:  [20] 20  (03/25 0654) BP: (100-143)/(65-88) 100/65 mmHg (03/25 0654) SpO2:  [100 %] 100 % (03/25 0654) Weight:  [51.6 kg (113 lb 12.1 oz)] 51.6 kg (113 lb 12.1 oz) (03/25 0654)  Intake/Output from previous day: 03/24 0701 - 03/25 0700 In: 720 [P.O.:720] Out: 1100 [Urine:1100] Intake/Output this shift: Total I/O In: -  Out: 800 [Urine:800] Nutritional status: Cardiac  Neurologic Exam: Mental Status: Alert, oriented, thought content appropriate.  Speech fluent without evidence of aphasia.  Able to follow 3 step commands without difficulty. Cranial Nerves: II: visual fields grossly normal, pupils equal, round, reactive to light and accommodation III,IV, VI: ptosis not present, extra-ocular motions intact bilaterally V,VII: smile symmetric, facial light touch sensation normal bilaterally VIII: hearing normal bilaterally IX,X: gag reflex present XI: trapezius strength/neck flexion  strength normal bilaterally XII: tongue strength normal  Motor: Right : Upper extremity   5/5    Left:     Upper extremity   5/5  Lower extremity   5/5     Lower extremity   5/5 Tone and bulk:normal tone throughout; no atrophy noted Sensory: Pinprick and light touch intact throughout, bilaterally Deep Tendon Reflexes: 2+ and symmetric throughout Plantars: Right: downgoing   Left: downgoing Cerebellar: Not tested   Lab Results: Results for orders placed during the hospital encounter of 06/06/11 (from the past 48 hour(s))  PHENYTOIN LEVEL, TOTAL     Status: Abnormal   Collection Time   06/10/11  5:40 AM      Component Value Range Comment   Phenytoin Lvl <2.5 (*) 10.0 - 20.0 (ug/mL)     No results found for this or any previous visit (from the past 240 hour(s)).  Lipid Panel No results found for this basename: CHOL,TRIG,HDL,CHOLHDL,VLDL,LDLCALC in the last 72 hours  Studies/Results: No results found.  Medications:  I have reviewed the patient's current medications. Scheduled:   . amoxicillin-clavulanate  1 tablet Oral Q12H  . citalopram  40 mg Oral Daily  . docusate  200 mg Oral BID  . gabapentin  600 mg Oral TID  . levETIRAcetam  250 mg Oral BID  . nicotine  21 mg Transdermal Daily  . pantoprazole (PROTONIX) IV  40 mg Intravenous Q1200  . phenytoin (DILANTIN) IV  100 mg Intravenous Q8H    Assessment/Plan:  Patient Active Hospital Problem List:  Seizure disorder (06/06/2011)   Assessment: Patient on Dilantin, Keppra and Neurontin.  With further investigation pseudoseizure diagnosis becomes more likely.  Dilantin level less than 2.5.  Patient on IV maintenance.  Would not be more aggressive with seizure management at this time.   Plan: 1. Increase Dilantin to 150mg  q8h            2. Records to be obtained from outpatient physician.             3. Would limit use of Ativan            4. Dilantin level in AM       LOS: 5 days   Thana Farr, MD [facility] [phone] 06/11/2011  2:38 PM   Addendum: Orlene Erm had with PCP and outpatient neurologist.  Patient has a known diagnosis of non-epileptic seizures.  EEG final shows no EEG correlate to episode captured on tracing.  Would not be aggressive with continued treatment.    Plan: 1.  Hold Ativan          2.  Restart home medications at home doses.  Will not add additional medications for seizure treatment          3.  Continue precautions so that patient will not harm self.            4.  May need psychiatry to see  Thana Farr, MD [facility] [phone] 06/11/2011  5:48 PM

## 2011-06-11 NOTE — Progress Notes (Signed)
At approximately  1237 patient had seizure like activity while getting an EEG done. Seizure witnessed by EEG Technician and other RN(Marrisa). Time of duration was approximately 2-3 min. Pt was moaning and moving back and forth at time of seizure. Pt was given Ativan IV. Pt appeared to be in and out of being responsive, sternal rub done during assessment, pt became more alert and following simple commands. Dr Raylene Everts called and came to see patient. Pt sent to CT scan at approximately 1320 with Nurse Ocie Cornfield). At approximately  1340 pt began seizure like activity with jerky movement and was responsive after episode ended. Time of duration for seizure like activity was appox 3 min. Pt stated that she felt when seizure was coming on. Pt is stable at this time. Pt placed back on Telemetry. Will continue to monitor patient.

## 2011-06-11 NOTE — Progress Notes (Signed)
Patient ID: Ruth Bell, female   DOB: [birth year], 51 y.o.   MRN: [id number]  Ct Abd/Pelvis- much improved, no obstruction or ileus, and prvious inflammation has resolved.  I spoke to pt's husband, pt sleepy-husband says she just had one of her episodes ?peudoseizure, and stays sleepy after these for a couple hours.  Will advance diet, decrease pain meds, work toward discharge home

## 2011-06-11 NOTE — Progress Notes (Signed)
Subjective Continued abd.pain this morning, continues to be distended, ,no results from enemas  Objective: s/p  Colon on 3/19/22013 with piecemeal polypectomy of a 14 mm polyp at distal transverse colon with post procedure abd. Pain, which continues, she is on Primaxin, , CT scan on 3/20 showed edema at the polypectomy site but no  perforation Vital signs in last 24 hours: Temp:  [97.7 F (36.5 C)-98.2 F (36.8 C)] 98.2 F (36.8 C) (03/25 0654) Pulse Rate:  [84-98] 84  (03/25 0654) Resp:  [16-20] 20  (03/25 0654) BP: (100-152)/(65-92) 100/65 mmHg (03/25 0654) SpO2:  [100 %] 100 % (03/25 0654) Weight:  [113 lb 12.1 oz (51.6 kg)] 113 lb 12.1 oz (51.6 kg) (03/25 0654) Last BM Date: 06/05/11 General:   Alert,  pleasant, cooperative in NAD Head:  Normocephalic and atraumatic. Eyes:  Sclera clear, no icterus.   Conjunctiva pink. Mouth:  No deformity or lesions, dentition normal. Neck:  Supple; no masses or thyromegaly. Heart:  Regular rate and rhythm; no murmurs, clicks, rubs,  or gallops. Lungs:  No wheezes or rales Abdomen:  tender distended abdomen, diffusely tender but no rebound, decrease bowl sounds Msk:  Symmetrical without gross deformities. Normal posture. Pulses:  Normal pulses noted. Extremities:  Without clubbing or edema. Neurologic:  Alert and  oriented x4;  grossly normal neurologically. Skin:  Intact without significant lesions or rashes.  Intake/Output from previous day: 03/24 0701 - 03/25 0700 In: 720 [P.O.:720] Out: 1100 [Urine:1100] Intake/Output this shift:    Lab Results: No results found for this basename: WBC:3,HGB:3,HCT:3,PLT:3 in the last 72 hours BMET No results found for this basename: NA:3,K:3,CL:3,CO2:3,GLUCOSE:3,BUN:3,CREATININE:3,CALCIUM:3 in the last 72 hours LFT No results found for this basename: PROT,ALBUMIN,AST,ALT,ALKPHOS,BILITOT,BILIDIR,IBILI in the last 72 hours PT/INR No results found for this basename: LABPROT:2,INR:2 in the last 72  hours Hepatitis Panel No results found for this basename: HEPBSAG,HCVAB,HEPAIGM,HEPBIGM in the last 72 hours  Studies/Results: No results found.   ASSESSMENT:   Continued abd. Distention, ?? Colon burn, r/o wallwed off perforation, will repat CT scan of the abdomen    PLAN:   CT scan of the abd with IV and oral contrast to follow up on postpolypectomy pain     LOS: 5 days   Lina Sar  06/11/2011, 8:41 AM

## 2011-06-12 DIAGNOSIS — F1210 Cannabis abuse, uncomplicated: Secondary | ICD-10-CM

## 2011-06-12 DIAGNOSIS — F411 Generalized anxiety disorder: Secondary | ICD-10-CM

## 2011-06-12 DIAGNOSIS — F329 Major depressive disorder, single episode, unspecified: Secondary | ICD-10-CM

## 2011-06-12 MED ORDER — RISPERIDONE 1 MG PO TBDP
1.0000 mg | ORAL_TABLET | Freq: Every day | ORAL | Status: DC
  Administered 2011-06-12: 1 mg via ORAL
  Filled 2011-06-12 (×3): qty 1

## 2011-06-12 MED ORDER — MIRTAZAPINE 15 MG PO TBDP
15.0000 mg | ORAL_TABLET | Freq: Every day | ORAL | Status: DC
  Administered 2011-06-12: 15 mg via ORAL
  Filled 2011-06-12 (×3): qty 1

## 2011-06-12 MED ORDER — HYDROCODONE-ACETAMINOPHEN 5-325 MG PO TABS
1.0000 | ORAL_TABLET | ORAL | Status: DC | PRN
  Administered 2011-06-12: 1 via ORAL
  Filled 2011-06-12: qty 1

## 2011-06-12 NOTE — Consult Note (Signed)
Reason for Consult:Seizure Disorder, r/o Pseudoseizures Referring Physician: Dr. Vivi Ferns is an 51 y.o. female.  HPI: Ruth Bell is a 51 y.o. female known to Dr. Russella Dar. Patient underwent colonoscopy  06/05/2011 at the [facility] due to positive family history of colon cancer in her father. She was found to have an appendiceal stump which was biopsied, one small 4 mm polyp in the transverse colon proximally which was removed with cold biopsy and a 14 mm polyp in the distal transverse colon which was removed piecemeal and then with cautery.  Patient states that she started having abdominal pain lin evening after her sedation had worn off. She says she tried to drink chicken broth last night became nauseated and vomited. She had pain all night which became progressively worse this morning. This has been associated with abdominal distention nausea and vomiting. She says she tried to eat an egg biscuit the next morning which she then vomited as well. She relates episodes of chills but no documented fever and complaints of a severe pressure-type constant lower abdominal pain radiating into her back. She says that she is very uncomfortable with walking sneezing coughing or any movement and has felt best lying down on her side. She has passed flatus, no bowel movement since procedure. She has had series of seizures from time of admission until recorded today.   AXIS I Pseudoseizures, Depression - r/o unresolved bereavement Cannabis abuse, Nicotine dependence AXIS II Deferred AXIS III Past Medical History  Diagnosis Date  . Seizures     history of pseudoseizure disorder  . Migraine, unspecified, without mention of intractable migraine without mention of status migrainosus   . Anemia   . Menometrorrhagia 2006  . Uterine leiomyoma 2006  . Depression   . Hypertension     Past Surgical History  Procedure Date  . Appendectomy   . Bilateral salpingoophorectomy 04/06/04  .  Abdominal hysterectomy 04/06/04  . Rotator cuff repair   AXIS IV supportive spouse, loss of parent, mental health medical problems  Family History  Problem Relation Age of Onset  . Diabetes Other   . Hypertension Other   . Heart disease Mother   . Colon cancer Father     Social History:  reports that she has been smoking Cigarettes.  She has a 20 pack-year smoking history. She has never used smokeless tobacco. She reports that she does not drink alcohol or use illicit drugs.  Allergies: No Known Allergies  Medications: I have reviewed the patient's current medications.  Results for orders placed during the hospital encounter of 06/06/11 (from the past 48 hour(s))  PHENYTOIN LEVEL, TOTAL     Status: Abnormal   Collection Time   06/11/11  3:35 PM      Component Value Range Comment   Phenytoin Lvl 22.8 (*) 10.0 - 20.0 (ug/mL)     Ct Abdomen Pelvis W Contrast  06/11/2011  *RADIOLOGY REPORT*  Clinical Data: Persistent abdominal pain.  Recent thermal injury to the transverse colon during colonoscopy and polypectomy.  CT ABDOMEN AND PELVIS WITH CONTRAST  Technique:  Multidetector CT imaging of the abdomen and pelvis was performed following the standard protocol during bolus administration of intravenous contrast.  Contrast:  06/11/2011  Comparison: 06/06/2011  Findings: Previously seen area of colonic wall thickening involving the left transverse colon is no longer visualized on today's exam. There is no evidence of inflammatory process or abnormal fluid collections.  No evidence of extraluminal gas collections or free  intraperitoneal air. No evidence of bowel obstruction.  The other abdominal parenchymal organs are normal in appearance except for a stable benign right renal cyst.  Gallbladder is unremarkable.  No evidence of hydronephrosis.  No soft tissue masses or lymphadenopathy identified.  Previous hysterectomy again noted.  IMPRESSION:  1.  Resolution of left transverse colon wall thickening  since previous study, consistent with resolving colonic injury or colitis. 2.  No evidence of extraluminal gas, abscess, or other significant abnormality.  Original Report Authenticated By: Danae Orleans, M.D.    Review of Systems  Unable to perform ROS: other   Blood pressure 101/73, pulse 92, temperature 98.8 F (37.1 C), temperature source Oral, resp. rate 16, height 5\' 2"  (1.575 m), weight 50.1 kg (110 lb 7.2 oz), SpO2 96.00%. Physical Exam  Assessment/Plan: Chart reviewed, Notes read, Discussed with Psych CSW Pt is seen ~ 1:00 pm 06/12/11  She is in a dark room.  She says she has been vomiting.  She has a very weak voice and states that she has seizures.  She says she first had  them after her mother died.  She said her sister also had seizures.  This pt had an EEG 06/11/11 during which she had seizure activity.  There was no corollate in EEG during her seizure[s] (Dr. Thad Ranger).  This and the other notes support that she responds to commands, assists with MD helping her and no notice of incontinence with seizures dispell Dx of seizure.  It is important for this patient to engage in therapy to assist her understanding and possible improve control of these pseudoseizure responses.  It may, or may not be, be significant to learn what role sister's seizures played a roll in family dynamics.  It would also be useful to obtain with pt's consent a copy of her 24 hr/video seizure study.  RECOMMENDATION 1. Based upon staff, MDs' and EEG report this pt's behavior is most consistent with pseudoseizures. 2. Agree with gabapentin 600 ng TID, Nicotine patch 21 mg/day 3. Suggest smoking cessation [and Cannabis] with pt Consider continuation of Nicoderm patch. 4. Suggest increase of Celexa, escitalopram,  to 40 mg suspension [10 mg/5 ml] for depression 5. Consider Remeron sol tab 15 mg at night for insomnia 6. Consider Risperdal mTab 1 mg at night, observe for an adverse event, e.g. EPS for thought  disorder 7. No further psychiatric needs identified.  MD Psychiatrist signs off.   Ruth Bell 06/12/2011, 7:44 PM

## 2011-06-12 NOTE — Progress Notes (Addendum)
Spoke with Pt re: current admission.  Pt reports that she began having seizures after her mom's death in 18-Aug-1998, for which she has been taking Dilantin.  Pt reports that she hasn't let go of her mom's death and states that she understands that this could be the cause of her seizures.  Pt is open to receiving counseling on an outpt basis and is interested in grief counseling offered through Hospice.  Pt reports that she has been married for 34 years and that she and her husband have a "good, supportive" marriage.  Pt has an adult son and daughter and 4 grandchildren.  Pt has been denied for disability 2x and will be seeking the assistance of a disability attorney soon.  Pt reports depression and anxiety with regard to finances.  Pt denies SI, HI, AVH, paranoia or delusions.  Pt denies ETOH or illegal drug use.  CSW to follow and to arrange for an appt with a grief counselor upon d/c.  Providence Crosby, LCSWA Clinical Social Work [phone]

## 2011-06-12 NOTE — Progress Notes (Signed)
Patient ID: Ruth Bell, female   DOB: 10-Jan-1961, 51 y.o.   MRN: [id number] [facility] Progress Note  Subjective: Tremulous this am. No c/o abdominal pain unless asked. She admits her abdomen is better. Nurse reports 2 normal Bm's today, and pt eating. See neuro notes -she has had multiple episodes of seizure like activity since admit but EEG  does not show any seizure activity.Marland Kitchen Psych consult recommended.  Objective:  Vital signs in last 24 hours: Temp:  [97.8 F (36.6 C)-98.6 F (37 C)] 98.4 F (36.9 C) (03/26 0559) Pulse Rate:  [86-92] 92  (03/26 0559) Resp:  [16-19] 19  (03/26 0559) BP: (112-133)/(74-80) 112/80 mmHg (03/26 0559) SpO2:  [98 %-100 %] 98 % (03/26 0559) Weight:  [110 lb 7.2 oz (50.1 kg)] 110 lb 7.2 oz (50.1 kg) (03/26 0500) Last BM Date: 06/11/11 General:   Alert,  Well-developed,    in NAD, shaky, tremulous Heart:  Regular rate and rhythm; no murmurs Pulm;clear Abdomen:  Soft, mild tenderness mid abdomen and nondistended. Normal bowel sounds, and without rebound.   Extremities:  Without edema.  Psych:  Alert and cooperative.affect odd  Intake/Output from previous day: 03/25 0701 - 03/26 0700 In: 652 [P.O.:582] Out: 1501 [Urine:1500; Stool:1] Intake/Output this shift: Total I/O In: -  Out: 200 [Urine:200]  PT/INRNo results found for this basename: HEPBSAG,HCVAB,HEPAIGM,HEPBIGM in the last 72 hours  Assessment / Plan: #1 51 yo female stable and improved s/p post polypectomy thermal injury. Ct yesterday shows resolution of inflammatory process. Will convert to oral abx for a few more days D/c narcotics-offer oral analgesic prn Stable from GI standpoint for discharge home. #2 Pseudoseizures-  Neuro has recommended Psych consult /conversion disorder- her mentall status and affect much different than on admission.  Requested Psych consult  Will ask Hospitalist service to accept in transfer.  Active Problems:  Abdominal pain, generalized     LOS: 6 days   Dalal Livengood  06/12/2011, 10:49 AM

## 2011-06-12 NOTE — Progress Notes (Signed)
I have reviewed the above note, examined the patient and agree with plan of treatment. Her abdomen remains tense but  radio graphically there is no bowl distention. The post polypectomy site has resolved on last CT scan. At this point, there are no GI problems but she seems to be in need of Psych counseling. Tolerating solid food without problems.

## 2011-06-12 NOTE — Progress Notes (Addendum)
[facility] PROGRESS NOTE    SUBJECTIVE   Upon walking into room nurse at bedside.  Patient was not responding to nurses voice.  Upon noxious stimuli to foot patient quickly awoke and sat up in her bed.  After 3 minutes patient was able to follow commands but was very teary eyed and moaning.   OBJECTIVE   Vital signs in last 24 hours: Temp:  [97.8 F (36.6 C)-98.6 F (37 C)] 98.4 F (36.9 C) (03/26 0559) Pulse Rate:  [86-92] 92  (03/26 0559) Resp:  [16-19] 19  (03/26 0559) BP: (112-133)/(74-80) 112/80 mmHg (03/26 0559) SpO2:  [98 %-100 %] 98 % (03/26 0559) Weight:  [50.1 kg (110 lb 7.2 oz)] 50.1 kg (110 lb 7.2 oz) (03/26 0500)  Intake/Output from previous day: 03/25 0701 - 03/26 0700 In: 652 [P.O.:582] Out: 1501 [Urine:1500; Stool:1] Intake/Output this shift:   Nutritional status: Cardiac  Past Medical History  Diagnosis Date  . Seizures     history of pseudoseizure disorder  . Migraine, unspecified, without mention of intractable migraine without mention of status migrainosus   . Anemia   . Menometrorrhagia 2006  . Uterine leiomyoma 2006  . Depression   . Hypertension     Neurologic Exam:   Mental Status: Alert, looking around, able to follow commands, teary eyed and emotional.  Able to follow 1-2 step commands without difficulty. Cranial Nerves: II-Visual fields grossly intact, tract my finger and blinking to threat bilaterally. III/IV/VI-Extraocular movements intact.  Pupils reactive bilaterally. V/VII-Smile symmetric and moved her facial muscle symmetrical with grimmace VIII-grossly intact IX/X-normal gag XI-bilateral shoulder shrug XII-midline tongue extension Motor: 5/5 bilaterally with normal tone and bulk, moving all extremities briskly with good strength.  Sensory: Pinprick and light touch intact throughout, bilaterally Deep Tendon Reflexes: 2+ and symmetric throughout Plantars: Downgoing  bilaterally Cerebellar: not tested Lab Results: No results found for this basename: cbc, bmp, coags, chol, tri, ldl, hga1c   Lipid Panel No results found for this basename: CHOL,TRIG,HDL,CHOLHDL,VLDL,LDLCALC in the last 72 hours  Studies/Results: Ct Abdomen Pelvis W Contrast  06/11/2011  *RADIOLOGY REPORT*  Clinical Data: Persistent abdominal pain.  Recent thermal injury to the transverse colon during colonoscopy and polypectomy.  CT ABDOMEN AND PELVIS WITH CONTRAST  Technique:  Multidetector CT imaging of the abdomen and pelvis was performed following the standard protocol during bolus administration of intravenous contrast.  Contrast:  06/11/2011  Comparison: 06/06/2011  Findings: Previously seen area of colonic wall thickening involving the left transverse colon is no longer visualized on today's exam. There is no evidence of inflammatory process or abnormal fluid collections.  No evidence of extraluminal gas collections or free intraperitoneal air. No evidence of bowel obstruction.  The other abdominal parenchymal organs are normal in appearance except for a stable benign right renal cyst.  Gallbladder is unremarkable.  No evidence of hydronephrosis.  No soft tissue masses or lymphadenopathy identified.  Previous hysterectomy again noted.  IMPRESSION:  1.  Resolution of left transverse colon wall thickening since previous study, consistent with resolving colonic injury or colitis. 2.  No evidence of extraluminal gas, abscess, or other significant abnormality.  Original Report Authenticated By: Danae Orleans, M.D.    Medications:     Scheduled:   . amoxicillin-clavulanate  1 tablet Oral  Q12H  . citalopram  40 mg Oral Daily  . docusate  200 mg Oral BID  . gabapentin  600 mg Oral TID  . nicotine  21 mg Transdermal Daily  . pantoprazole (PROTONIX) IV  40 mg Intravenous Q1200  . DISCONTD: levETIRAcetam  250 mg Oral BID  . DISCONTD: phenytoin (DILANTIN) IV  150 mg Intravenous Q8H  . DISCONTD:  phenytoin  100 mg Oral TID  . DISCONTD: phenytoin (DILANTIN) IV  100 mg Intravenous Q8H    Assessment/Plan:   EEG--This is a normal awake and drowsy EEG. Two seizure-like episodes were noted during the tracing, but yet no EEG correlate was noted.   Patient Active Hospital Problem List: Seizure disorder (06/06/2011) Assessment: Patient on Dilantin, Keppra and Neurontin.Conversation had with PCP and outpatient neurologist. Patient has a known diagnosis of non-epileptic seizures.  EEG obtained during two seizure-like episodes with no EEG corrilation.   Plan: 1. Dilantin level this AM 22.8.  Will hold dilantin today and resume tomorrow at home dose. Will not add additional AED at this time.  2. Discussion had with out patient neurologist--patient has known diagnosis of non-epileptic seizures.  3. Would limit use of Ativan as it will slow gut motility  4. May need psychiatry to see 5. Continue precautions so that patient will not harm self. 6. No driving heavy machinery or automobiles, no climbing heights or staying in standing water for 6-12 months or until cleared by neurologist/primary care MD   Neurology will S/O at this time    Felicie Morn PA-C [facility] [phone]  06/12/2011, 8:59 AM

## 2011-06-12 NOTE — Consult Note (Signed)
Reason for Re consult: pseudo seizures/ ?  Conversion disorder.   Subjective:  Pt reports 2 episodes of loose BM. Persistent nausea and vomiting. Abdominal pain has improved.  Talked in length about the fact that she has pseudo seizures and a psychiatry consult has been called and recommended started patient on Remeron and Risperdal.    Objective: Weight change: -1.5 kg (-3 lb 4.9 oz)  Intake/Output Summary (Last 24 hours) at 06/12/11 1930 Last data filed at 06/12/11 1300  Gross per 24 hour  Intake    582 ml  Output    600 ml  Net    -18 ml    Filed Vitals:   06/12/11 1425  BP: 101/73  Pulse: 92  Temp: 98.8 F (37.1 C)  Resp: 16   On exam she is alert afebrile comfortable , no acute distress CVS S1S2 heard Lungs clear Abdomen : firm mild generalized tenderness, bowel sounds heard Extremities: no pedal edema Neuro: no focal deficits.   Lab Results: No results found for this or any previous visit (from the past 24 hour(s)).   Micro Results: No results found for this or any previous visit (from the past 240 hour(s)).  Studies/Results: Ct Head Wo Contrast  06/07/2011  *RADIOLOGY REPORT*  Clinical Data: Seizure  CT HEAD WITHOUT CONTRAST  Technique:  Contiguous axial images were obtained from the base of the skull through the vertex without contrast.  Comparison: 10/30/2010  Findings: Normal ventricular morphology. No midline shift or mass effect. Normal appearance of brain parenchyma. No intracranial hemorrhage, mass lesion, or acute infarction. Visualized paranasal sinuses and mastoid air cells clear. Bones unremarkable.  IMPRESSION: No acute intracranial abnormalities. If patient has persistent or recurrent seizures, consider MR imaging of the brain with and without contrast for further evaluation.  Original Report Authenticated By: Lollie Marrow, M.D.   Ct Abdomen Pelvis W Contrast  06/11/2011  *RADIOLOGY REPORT*  Clinical Data: Persistent abdominal pain.  Recent thermal  injury to the transverse colon during colonoscopy and polypectomy.  CT ABDOMEN AND PELVIS WITH CONTRAST  Technique:  Multidetector CT imaging of the abdomen and pelvis was performed following the standard protocol during bolus administration of intravenous contrast.  Contrast:  06/11/2011  Comparison: 06/06/2011  Findings: Previously seen area of colonic wall thickening involving the left transverse colon is no longer visualized on today's exam. There is no evidence of inflammatory process or abnormal fluid collections.  No evidence of extraluminal gas collections or free intraperitoneal air. No evidence of bowel obstruction.  The other abdominal parenchymal organs are normal in appearance except for a stable benign right renal cyst.  Gallbladder is unremarkable.  No evidence of hydronephrosis.  No soft tissue masses or lymphadenopathy identified.  Previous hysterectomy again noted.  IMPRESSION:  1.  Resolution of left transverse colon wall thickening since previous study, consistent with resolving colonic injury or colitis. 2.  No evidence of extraluminal gas, abscess, or other significant abnormality.  Original Report Authenticated By: Danae Orleans, M.D.   Ct Abdomen Pelvis W Contrast  06/06/2011  *RADIOLOGY REPORT*  Clinical Data: Acute abdominal pain and physical signs of peritonitis.  Status post colonoscopy and polypectomy.  CT ABDOMEN AND PELVIS WITH CONTRAST  Technique:  Multidetector CT imaging of the abdomen and pelvis was performed following the standard protocol during bolus administration of intravenous contrast.  Contrast: 80mL OMNIPAQUE IOHEXOL 300 MG/ML IJ SOLN  Comparison: 10/28/2010  Findings: There is no evidence of free intraperitoneal air or other extraluminal gas collections.  No abnormal fluid collections are seen within the abdomen or pelvis.  No evidence of bowel obstruction.  An area of colonic wall thickening is seen involving the left transverse colon, and this could be due to colitis  or carcinoma. No definite diverticulae seen in this region.  There is no evidence of pericolonic inflammatory changes or fluid collections.  No other areas of bowel wall thickening identified.  The abdominal parenchymal organs are normal in appearance except for several benign right renal cysts.  Gallbladder is unremarkable. No evidence of hydronephrosis.  No lymphadenopathy identified within the abdomen or pelvis.  Prior hysterectomy noted.  IMPRESSION:  1. Wall thickening involving the left transverse colon, which could be due to colitis or carcinoma.  Correlation with the recent colonoscopy results is recommended.  No evidence of bowel obstruction. 2.  No evidence of abscess or bowel perforation.  Original Report Authenticated By: Danae Orleans, M.D.   Dg Abd 2 Views  06/06/2011  *RADIOLOGY REPORT*  Clinical Data: Lower abdominal pain, nausea, vomiting  ABDOMEN - 2 VIEW  Comparison: 01/26/2004  Findings: Single surgical clip right pelvis. Bilateral pelvic phleboliths. Nonobstructive bowel gas pattern. No bowel dilatation, bowel wall thickening, or free intraperitoneal air. On the supine view a faint somewhat circular density projects over right mid abdomen and right transverse process of L3, question external artifact or clothing, different in configuration on upright view. Bones appear demineralized. No definite acute findings identified.  IMPRESSION: Suspect external artifact right mid abdomen. Nonobstructive bowel gas pattern.  Original Report Authenticated By: Lollie Marrow, M.D.   Medications: Scheduled Meds:   . amoxicillin-clavulanate  1 tablet Oral Q12H  . citalopram  40 mg Oral Daily  . docusate  200 mg Oral BID  . gabapentin  600 mg Oral TID  . mirtazapine  15 mg Oral QHS  . nicotine  21 mg Transdermal Daily  . pantoprazole (PROTONIX) IV  40 mg Intravenous Q1200  . risperiDONE  1 mg Oral QHS  . DISCONTD: phenytoin  100 mg Oral TID   Continuous Infusions:  PRN Meds:.acetaminophen,  diphenhydrAMINE, HYDROcodone-acetaminophen, ondansetron (ZOFRAN) IV, ondansetron, DISCONTD: HYDROmorphone, DISCONTD: polyethylene glycol  Assessment/Plan: Patient Active Hospital Problem List: 1. Abdominal pain, generalized (06/06/2011) Resolving. Further treatment as per GI.   2. non-epileptic seizures; Neurology recommended dilantin and keppra and neurontin with no further work up. Pschiatry has been called for possible depression, and suggested that she might have conversion syndrome as all the symptoms started after her mom's death. Recommended starting the patient on Risperidal and Remeron and watching her overnight for any EPS symptoms and if she doesn't have side effects, she can  Be discharged in the morning.  3. Depression: continue with celexa.  4. Hypokalemia: replete as needed.   No other medical issues at this time.    My colleague Will follow in am, and if the patient doesn't develop any adverse effects to the risperidal, she can be discharged home by the primary in am.     LOS: 6 days   Azeez Dunker 06/12/2011, 7:30 PM

## 2011-06-13 DIAGNOSIS — Z86010 Personal history of colonic polyps: Secondary | ICD-10-CM

## 2011-06-13 MED ORDER — MIRTAZAPINE 15 MG PO TBDP: 15.0000 mg | ORAL_TABLET | Freq: Every day | ORAL | Status: DC

## 2011-06-13 MED ORDER — CITALOPRAM HYDROBROMIDE 40 MG PO TABS
40.0000 mg | ORAL_TABLET | Freq: Every day | ORAL | Status: DC
  Filled 2011-06-13: qty 1

## 2011-06-13 MED ORDER — HYDROCODONE-ACETAMINOPHEN 5-325 MG PO TABS: 1.0000 | ORAL_TABLET | Freq: Four times a day (QID) | ORAL | Status: DC | PRN

## 2011-06-13 MED ORDER — RISPERIDONE 1 MG PO TBDP: 1.0000 mg | ORAL_TABLET | Freq: Every day | ORAL | Status: DC

## 2011-06-13 NOTE — Discharge Summary (Signed)
[facility] Discharge Summary  Name: [name] MRN: [id number] DOB: 19-Dec-1960 51 y.o. PCP:  Lemont Fillers., NP, NP  Date of Admission: 06/06/2011  3:37 PM Date of Discharge: 06/13/2011 Attending Physician: Rachael Fee, MD  Discharge Diagnosis: #1 acute post polypectomy thermal injury, resolved #2 adenomatous colon polyps, large polyp removed at colonoscopy 06/05/2011. Patient will need followup in one year #3 pseudoseizure disorder #4 chronic anxiety and depression  Consultations:   psychiatry Dr. Baron Sane, internal medicine-[facility] Procedures Performed: none  Ct Head Wo Contrast  06/07/2011  *RADIOLOGY REPORT*  Clinical Data: Seizure  CT HEAD WITHOUT CONTRAST  Technique:  Contiguous axial images were obtained from the base of the skull through the vertex without contrast.  Comparison: 10/30/2010  Findings: Normal ventricular morphology. No midline shift or mass effect. Normal appearance of brain parenchyma. No intracranial hemorrhage, mass lesion, or acute infarction. Visualized paranasal sinuses and mastoid air cells clear. Bones unremarkable.  IMPRESSION: No acute intracranial abnormalities. If patient has persistent or recurrent seizures, consider MR imaging of the brain with and without contrast for further evaluation.  Original Report Authenticated By: Lollie Marrow, M.D.   Ct Abdomen Pelvis W Contrast  06/11/2011  *RADIOLOGY REPORT*  Clinical Data: Persistent abdominal pain.  Recent thermal injury to the transverse colon during colonoscopy and polypectomy.  CT ABDOMEN AND PELVIS WITH CONTRAST  Technique:  Multidetector CT imaging of the abdomen and pelvis was performed following the standard protocol during bolus administration of intravenous contrast.  Contrast:  06/11/2011  Comparison: 06/06/2011  Findings: Previously seen area of colonic wall thickening involving the left transverse colon is no longer visualized on today's exam. There is no evidence  of inflammatory process or abnormal fluid collections.  No evidence of extraluminal gas collections or free intraperitoneal air. No evidence of bowel obstruction.  The other abdominal parenchymal organs are normal in appearance except for a stable benign right renal cyst.  Gallbladder is unremarkable.  No evidence of hydronephrosis.  No soft tissue masses or lymphadenopathy identified.  Previous hysterectomy again noted.  IMPRESSION:  1.  Resolution of left transverse colon wall thickening since previous study, consistent with resolving colonic injury or colitis. 2.  No evidence of extraluminal gas, abscess, or other significant abnormality.  Original Report Authenticated By: Danae Orleans, M.D.   Ct Abdomen Pelvis W Contrast  06/06/2011  *RADIOLOGY REPORT*  Clinical Data: Acute abdominal pain and physical signs of peritonitis.  Status post colonoscopy and polypectomy.  CT ABDOMEN AND PELVIS WITH CONTRAST  Technique:  Multidetector CT imaging of the abdomen and pelvis was performed following the standard protocol during bolus administration of intravenous contrast.  Contrast: 80mL OMNIPAQUE IOHEXOL 300 MG/ML IJ SOLN  Comparison: 10/28/2010  Findings: There is no evidence of free intraperitoneal air or other extraluminal gas collections.  No abnormal fluid collections are seen within the abdomen or pelvis.  No evidence of bowel obstruction.  An area of colonic wall thickening is seen involving the left transverse colon, and this could be due to colitis or carcinoma. No definite diverticulae seen in this region.  There is no evidence of pericolonic inflammatory changes or fluid collections.  No other areas of bowel wall thickening identified.  The abdominal parenchymal organs are normal in appearance except for several benign right renal cysts.  Gallbladder is unremarkable. No evidence of hydronephrosis.  No lymphadenopathy identified within the abdomen or pelvis.  Prior hysterectomy noted.  IMPRESSION:  1. Wall  thickening involving the left transverse colon,  which could be due to colitis or carcinoma.  Correlation with the recent colonoscopy results is recommended.  No evidence of bowel obstruction. 2.  No evidence of abscess or bowel perforation.  Original Report Authenticated By: Danae Orleans, M.D.   Dg Abd 2 Views  06/06/2011  *RADIOLOGY REPORT*  Clinical Data: Lower abdominal pain, nausea, vomiting  ABDOMEN - 2 VIEW  Comparison: 01/26/2004  Findings: Single surgical clip right pelvis. Bilateral pelvic phleboliths. Nonobstructive bowel gas pattern. No bowel dilatation, bowel wall thickening, or free intraperitoneal air. On the supine view a faint somewhat circular density projects over right mid abdomen and right transverse process of L3, question external artifact or clothing, different in configuration on upright view. Bones appear demineralized. No definite acute findings identified.  IMPRESSION: Suspect external artifact right mid abdomen. Nonobstructive bowel gas pattern.  Original Report Authenticated By: Lollie Marrow, M.D.    GI Procedures: none  History/Physical Exam:  See Admission H&P  Admission HPI: Ruth Bell is a 51 year old female who underwent colonoscopy with polypectomy with Dr. Russella Dar [facility]. She developed abdominal pain which is generalized that evening which became progressive. She was evaluated in our office on 06/06/2011 and admitted to rule out colon perforation. She had had a large polyp removed in the transverse colon, which was removed piecemeal and with cautery. Patient had diffuse abdominal pain at the time she was seen in the office with peritoneal signs. She was hemodynamically stable and had not had a fever. She was admitted for CT of the abdomen and pelvis, and supportive management.  Hospital Course by problem list: Patient was admitted to the GI service, she was started on IV fluids analgesics and IV antibiotics. She underwent CT scan of  the abdomen and pelvis on the evening of admission and this did not show any evidence of free air. She was found to have an inflammatory process in the transverse colon consistent with a post-polypectomy thermal injury. She continued to have significant abdominal pain over the next 48 hours but remains very hemodynamically stable. Her diet was able to be gradually advanced. She had evidence of seizure activity within a couple of days of admission and neurology was asked to see her. On further evaluation she was felt to have a pseudoseizure disorder, without any evidence of seizure activity on EEG which was done during one of her episodes. She continued to complain of abdominal pain and had multiple "episodes" of seizure type activity over the next few days. She had repeat CT scan of the abdomen and pelvis done on 06/11/2011 as she continued to complain of abdominal distention. This showed resolution of the inflammatory changes. Her antibiotics were converted to oral was continued on a solid diet. It was felt that she had some voluntary guarding and distention of her abdomen. She was examined at 1 point immediately after one of her seizure-type episodes in her abdomen was completely benign and nondistended. She was encouraged and reassured that her intra-abdominal inflammation had resolved.  She was also seen by psychiatry, Dr. Baron Sane on 06/12/2011 and additional recommendations were made for changes in her meds. She was continued on her seizure meds, though neurology suggested no further neuro workup. In addition she was started on Remeron, Celexa was increased to 40 mg per day, and she was started on Risperdal 1 mg by mouth at bedtime daily. She was felt to be stable from a neuropsychiatric standpoint for discharge and is a well discharged home today.  She will followup with Dr. Russella Dar in our office on 07/02/2011 area She is to stay off of aspirin and NSAIDs in the interim. She is to make followup appointment  with Melissa O. Lendell Caprice, primary care within the next week for general followup.  Discharge Vitals:  BP 94/66  Pulse 96  Temp(Src) 97.8 F (36.6 C) (Oral)  Resp 20  Ht 5\' 2"  (1.575 m)  Wt 109 lb 12.6 oz (49.8 kg)  BMI 20.08 kg/m2  SpO2 99%  Discharge Labs: No results found for this or any previous visit (from the past 24 hour(s)).  Disposition and follow-up:   Ms.Shriley ABIGIAL [place] was discharged from [facility] in good condition.    Follow-up Appointments: Discharge Orders    Future Appointments: Provider: Department: Dept Phone: Center:   07/02/2011 11:15 AM Meryl Dare, MD,FACG [facility] [phone] [facility]   08/31/2011 10:30 AM Sandford Craze, NP [facility] [phone] [facility]      Discharge Medications: Medication List  As of 06/13/2011  2:53 PM   STOP taking these medications         ibuprofen 200 MG tablet      meloxicam 7.5 MG tablet         TAKE these medications         bisoprolol-hydrochlorothiazide 5-6.25 MG per tablet   Commonly known as: ZIAC   Take 1 tablet by mouth 2 (two) times daily.      citalopram 40 MG tablet   Commonly known as: CELEXA   Take 40 mg by mouth every morning.      gabapentin 600 MG tablet   Commonly known as: NEURONTIN   Take 600 mg by mouth 3 (three) times daily.      HYDROcodone-acetaminophen 5-325 MG per tablet   Commonly known as: NORCO   Take 1 tablet by mouth every 6 (six) hours as needed for pain.      mirtazapine 15 MG disintegrating tablet   Commonly known as: REMERON SOL-TAB   Take 1 tablet (15 mg total) by mouth at bedtime.      mulitivitamin with minerals Tabs   Take 1 tablet by mouth daily.      NUCYNTA 75 MG Tabs   Generic drug: Tapentadol HCl   Take 1 tablet by mouth 2 (two) times daily as needed. For pain.      omeprazole 20 MG capsule   Commonly known as: PRILOSEC   Take 20 mg by mouth at bedtime.      phenytoin 100 MG ER capsule   Commonly known as: DILANTIN    Take 1 capsule (100 mg total) by mouth 3 (three) times daily.      promethazine 25 MG tablet   Commonly known as: PHENERGAN   Take 25 mg by mouth every 6 (six) hours as needed. For nausea.      risperiDONE 1 MG disintegrating tablet   Commonly known as: RISPERDAL M-TABS   Take 1 tablet (1 mg total) by mouth at bedtime.         ASK your doctor about these medications         nicotine 21 mg/24hr patch   Commonly known as: NICODERM CQ - dosed in mg/24 hours   Place 1 patch onto the skin daily.            Signed: Mike Gip 06/13/2011, 2:53 PM

## 2011-06-13 NOTE — Progress Notes (Signed)
Patient ID: Ruth Bell, female   DOB: Jul 21, 1960, 51 y.o.   MRN: [id number] [facility] Progress Note  Subjective: Mentating more normally this am- worried about being home alone. She hs been eating-c/o mild abdominal pain.Does not seem to have ant insight into her "peudoseizure" dx.  Objective:  Vital signs in last 24 hours: Temp:  [97.9 F (36.6 C)-98.8 F (37.1 C)] 97.9 F (36.6 C) (03/27 0612) Pulse Rate:  [78-92] 78  (03/27 0612) Resp:  [16-19] 19  (03/27 0612) BP: (97-101)/(61-73) 97/61 mmHg (03/27 0612) SpO2:  [96 %-98 %] 98 % (03/27 0612) Weight:  [109 lb 12.6 oz (49.8 kg)] 109 lb 12.6 oz (49.8 kg) (03/27 0612) Last BM Date: 06/12/11 General:   Alert,  Well-developed,    in NAD Heart:  Regular rate and rhythm; no murmurs Pulm;clear Abdomen:  Soft, nontender and nondistended. Normal bowel sounds,  Extremities:  Without edema. Neurologic:  Alert and  oriented x3;  grossly normal neurologically still a little shaky Psych:  Alert and cooperative. Normal mood and affect.  Intake/Output from previous day: 03/26 0701 - 03/27 0700 In: 240 [P.O.:240] Out: 1100 [Urine:1100] Intake/Output this shift: Total I/O In: -  Out: 200 [Urine:200]     Assessment / Plan:  #1 51 yo female with post polypectomy thermal injury- s/p colonoscopy on 3/19- Infalmmatory changes have resolved on CT- she has been stable from a GI standpoint  For discharge # 2 Altered mental status- Pseudoseizures- appreciate Psych, and  Medicine help-  Hopefully can be discharged home later today or in am on new regimen  LOS: 7 days   Terrence Pizana  06/13/2011, 10:13 AM

## 2011-06-13 NOTE — Progress Notes (Signed)
I have reviewed the above note, examined the patient and agree with plan of treatment. She is feeling better today on a new psychotropic medications. Tolerating regular diet. Post polypectomy complications have resolved. Follow up Dr Russella Dar

## 2011-06-13 NOTE — Progress Notes (Signed)
Patient briefly seen and examined. Chart reviewed. Has been seen in consultation by Psychiatry and a diagnosis of possible pseudoseizures has been made. Has been started on remeron, risperdal. No side effects noted. Ok for DC home from medicine standpoint. Will sign off at this time. Please call back with questions.  Chaya Jan [facility] Pager: [phone]

## 2011-06-14 NOTE — Progress Notes (Signed)
Contacted Pt at home, as she discharged yesterday.  Pt continues to be interested in grief counseling through Hospice and gave CSW permission to contact Hospice on her behalf and schedule an appointment for her.  Contacted Hospice and obtained an appt time for Pt: April 2nd at 10.  Relayed information to Pt.  Pt requesting that CSW call back and leave the information on her voicemail, as she is unable to get up to write it down.  Left Pt's appointment information, as well as Hospice's address and phone number, on Pt's voicemail.  Also left CSW's contact information.  Providence Crosby, LCSWA Clinical Social Work [phone]

## 2011-06-19 ENCOUNTER — Ambulatory Visit (INDEPENDENT_AMBULATORY_CARE_PROVIDER_SITE_OTHER): Payer: 59 | Admitting: Family

## 2011-06-19 ENCOUNTER — Encounter: Payer: Self-pay | Admitting: Family

## 2011-06-19 ENCOUNTER — Ambulatory Visit (HOSPITAL_BASED_OUTPATIENT_CLINIC_OR_DEPARTMENT_OTHER): Payer: 59

## 2011-06-19 ENCOUNTER — Encounter (HOSPITAL_BASED_OUTPATIENT_CLINIC_OR_DEPARTMENT_OTHER): Payer: Self-pay

## 2011-06-19 ENCOUNTER — Ambulatory Visit (HOSPITAL_BASED_OUTPATIENT_CLINIC_OR_DEPARTMENT_OTHER)
Admission: RE | Admit: 2011-06-19 | Discharge: 2011-06-19 | Disposition: A | Discharge status: Home / Self Care | Payer: 59 | Source: Ambulatory Visit | Attending: Family | Admitting: Family

## 2011-06-19 DIAGNOSIS — N760 Acute vaginitis: Secondary | ICD-10-CM

## 2011-06-19 DIAGNOSIS — R10819 Abdominal tenderness, unspecified site: Secondary | ICD-10-CM

## 2011-06-19 DIAGNOSIS — N390 Urinary tract infection, site not specified: Secondary | ICD-10-CM

## 2011-06-19 DIAGNOSIS — K649 Unspecified hemorrhoids: Secondary | ICD-10-CM

## 2011-06-19 DIAGNOSIS — R109 Unspecified abdominal pain: Secondary | ICD-10-CM | POA: Insufficient documentation

## 2011-06-19 DIAGNOSIS — B9689 Other specified bacterial agents as the cause of diseases classified elsewhere: Secondary | ICD-10-CM

## 2011-06-19 DIAGNOSIS — A499 Bacterial infection, unspecified: Secondary | ICD-10-CM

## 2011-06-19 DIAGNOSIS — F445 Conversion disorder with seizures or convulsions: Secondary | ICD-10-CM

## 2011-06-19 DIAGNOSIS — N898 Other specified noninflammatory disorders of vagina: Secondary | ICD-10-CM

## 2011-06-19 DIAGNOSIS — R569 Unspecified convulsions: Secondary | ICD-10-CM

## 2011-06-19 LAB — POCT URINALYSIS DIPSTICK
Glucose, UA: NEGATIVE
Nitrite, UA: NEGATIVE
Spec Grav, UA: >=1.03
Urobilinogen, UA: 0.2
pH, UA: 6

## 2011-06-19 MED ORDER — IOHEXOL 300 MG/ML  SOLN
100.0000 mL | Freq: Once | INTRAMUSCULAR | Status: AC | PRN
  Administered 2011-06-19: 100 mL via INTRAVENOUS

## 2011-06-19 MED ORDER — CIPROFLOXACIN HCL 500 MG PO TABS: 500.0000 mg | ORAL_TABLET | Freq: Two times a day (BID) | ORAL | Status: AC

## 2011-06-19 MED ORDER — HYDROCORTISONE ACE-PRAMOXINE 1-1 % RE FOAM: 1.0000 | Freq: Two times a day (BID) | RECTAL | Status: AC

## 2011-06-19 NOTE — Progress Notes (Signed)
Subjective:    Patient ID: Ruth Bell, female    DOB: 06/25/1960, 51 y.o.   MRN: [id number]  HPI  Ms.  Bell is a 51 yr old female who presents today for hospital follow up.  She was hospitalized for 1 week and returned home 1 week ago.  Pt underwent colonoscopy/polypectomy and subsequently developed acute abdominal pain.  She was admitted to [facility].  Records are reviewed. She had a CT abd/pelvis on 3/20 which noted some thickening of the left transverse colon, but was otherwise unremarkable.  During her hospitalization she also experienced a seizure and was evaluated by Neurology.  She underwent EEG and her seizure activity was deemed a pseudoseizure.  Since returning home she reports one pseudoseizure.  She reports that these only occur with pain episodes.    She reports that she is scheduled to see Dr. Hyacinth Meeker her neurologist in June.   She has two concerns today: ongoing lower abdominal pain and a Vaginal discharge.  She reports some pelvic pressure, "like childbirth." Some associated vaginal discharge which "stinks." She had sex 1 month ago with her husband.  She is s/p hysterectomy in 1990.  She believes that her ovaries are intact.      Review of Systems See HPI  Past Medical History  Diagnosis Date  . Seizures     history of pseudoseizure disorder  . Migraine, unspecified, without mention of intractable migraine without mention of status migrainosus   . Anemia   . Menometrorrhagia 2006  . Uterine leiomyoma 2006  . Depression   . Hypertension     History   Social History  . Marital Status: Married    Spouse Name: [name]    Number of Children: N/A  . Years of Education: N/A   Occupational History  . CMA    Social History Main Topics  . Smoking status: Current Some Day Smoker -- 0.5 packs/day for 40 years    Types: Cigarettes  . Smokeless tobacco: Never Used  . Alcohol Use: No  . Drug Use: No  . Sexually Active: Yes    Birth Control/ Protection: None   Other Topics  Concern  . Not on file   Social History Narrative  . No narrative on file    Past Surgical History  Procedure Date  . Appendectomy   . Bilateral salpingoophorectomy 04/06/04  . Abdominal hysterectomy 04/06/04  . Rotator cuff repair     Family History  Problem Relation Age of Onset  . Diabetes Other   . Hypertension Other   . Heart disease Mother   . Colon cancer Father     No Known Allergies  Current Outpatient Prescriptions on File Prior to Visit  Medication Sig Dispense Refill  . bisoprolol-hydrochlorothiazide (ZIAC) 5-6.25 MG per tablet Take 1 tablet by mouth 2 (two) times daily.       . citalopram (CELEXA) 40 MG tablet Take 40 mg by mouth every morning.       . gabapentin (NEURONTIN) 600 MG tablet Take 600 mg by mouth 3 (three) times daily.        Marland Kitchen HYDROcodone-acetaminophen (NORCO) 5-325 MG per tablet Take 1 tablet by mouth every 6 (six) hours as needed for pain.  30 tablet  0  . mirtazapine (REMERON SOL-TAB) 15 MG disintegrating tablet Take 1 tablet (15 mg total) by mouth at bedtime.  30 tablet  0  . Multiple Vitamin (MULITIVITAMIN WITH MINERALS) TABS Take 1 tablet by mouth daily.      Marland Kitchen  omeprazole (PRILOSEC) 20 MG capsule Take 20 mg by mouth at bedtime.      . phenytoin (DILANTIN) 100 MG ER capsule Take 1 capsule (100 mg total) by mouth 3 (three) times daily.  90 capsule  1  . promethazine (PHENERGAN) 25 MG tablet Take 25 mg by mouth every 6 (six) hours as needed. For nausea.      . risperiDONE (RISPERDAL M-TABS) 1 MG disintegrating tablet Take 1 tablet (1 mg total) by mouth at bedtime.  30 tablet  0  . Tapentadol HCl (NUCYNTA) 75 MG TABS Take 1 tablet by mouth 2 (two) times daily as needed. For pain.        BP 116/70  Pulse 90  Temp(Src) 98 F (36.7 C) (Oral)  Resp 16  Wt 110 lb (49.896 kg)  SpO2 99%       Objective:   Physical Exam  Constitutional: She appears well-developed and well-nourished. No distress.  Cardiovascular: Normal rate and regular rhythm.    No murmur heard. Pulmonary/Chest: Effort normal and breath sounds normal. No respiratory distress. She has no wheezes. She has no rales. She exhibits no tenderness.  Abdominal: Bowel sounds are normal. She exhibits distension.       + tenderness lower abdomen, + guarding.    Genitourinary:       Vaginal redness/irritation is noted without notable lesions or discharge.  Pt was uncomfortable on bimanual exam right adnexa.  + external hemorrhoids noted.   Psychiatric: She has a normal mood and affect. Her speech is normal and behavior is normal. Judgment and thought content normal. Cognition and memory are normal.          Assessment & Plan:

## 2011-06-19 NOTE — Patient Instructions (Signed)
Please complete your CT on the first floor. We will call you with results. 

## 2011-06-20 ENCOUNTER — Telehealth: Payer: Self-pay | Admitting: Family

## 2011-06-20 DIAGNOSIS — B9689 Other specified bacterial agents as the cause of diseases classified elsewhere: Secondary | ICD-10-CM | POA: Insufficient documentation

## 2011-06-20 DIAGNOSIS — N390 Urinary tract infection, site not specified: Secondary | ICD-10-CM | POA: Insufficient documentation

## 2011-06-20 DIAGNOSIS — K649 Unspecified hemorrhoids: Secondary | ICD-10-CM | POA: Insufficient documentation

## 2011-06-20 DIAGNOSIS — R109 Unspecified abdominal pain: Secondary | ICD-10-CM | POA: Insufficient documentation

## 2011-06-20 LAB — WET PREP BY MOLECULAR PROBE
Candida species: NEGATIVE
Gardnerella vaginalis: POSITIVE — AB
Trichomonas vaginosis: NEGATIVE

## 2011-06-20 MED ORDER — METRONIDAZOLE 0.75 % VA GEL: 1.0000 | Freq: Two times a day (BID) | VAGINAL | Status: AC

## 2011-06-20 NOTE — Assessment & Plan Note (Signed)
Will start proctofoam hc.

## 2011-06-20 NOTE — Assessment & Plan Note (Signed)
She is to keep upcoming apt with Dr. Hyacinth Meeker.

## 2011-06-20 NOTE — Telephone Encounter (Signed)
Pt notified and scheduled f/u for 07/03/11 at 2:15pm.

## 2011-06-20 NOTE — Assessment & Plan Note (Signed)
UA notes large blood, small leuks.  Will culture and start cipro given abd complaints.

## 2011-06-20 NOTE — Assessment & Plan Note (Signed)
Wet prep reveals BV, treat with metrogel.

## 2011-06-20 NOTE — Assessment & Plan Note (Addendum)
CT is performed to exclude abscess/acute abdominal findings and is negative.  Colonic thickening noted on 3/20 CT is resolved.

## 2011-06-20 NOTE — Telephone Encounter (Addendum)
Pls call pt and let her know that her wet prep shows bacterial vaginosis.  I sent metrogel to her pharmacy.  GC/Chlamydia pending. Also, I would like to see her back in the office in 1-2 weeks for follow up please.

## 2011-06-21 LAB — URINE CULTURE
Colony Count: NO GROWTH
Organism ID, Bacteria: NO GROWTH

## 2011-06-21 LAB — GC/CHLAMYDIA PROBE AMP, GENITAL
Chlamydia, DNA Probe: NEGATIVE
GC Probe Amp, Genital: NEGATIVE

## 2011-07-02 ENCOUNTER — Ambulatory Visit: Payer: 59 | Admitting: Gastroenterology

## 2011-07-03 ENCOUNTER — Ambulatory Visit: Payer: 59 | Admitting: Family

## 2011-07-03 DIAGNOSIS — Z0289 Encounter for other administrative examinations: Secondary | ICD-10-CM

## 2011-08-08 ENCOUNTER — Encounter: Payer: Self-pay | Admitting: Family

## 2011-08-31 ENCOUNTER — Ambulatory Visit: Payer: 59 | Admitting: Family

## 2011-09-04 ENCOUNTER — Encounter: Payer: Self-pay | Admitting: Family

## 2011-09-04 ENCOUNTER — Ambulatory Visit (INDEPENDENT_AMBULATORY_CARE_PROVIDER_SITE_OTHER): Payer: 59 | Admitting: Family

## 2011-09-04 VITALS — BP 124/80 | HR 78 | Temp 98.1°F | Resp 16 | Ht 62.0 in | Wt 110.0 lb

## 2011-09-04 DIAGNOSIS — F32A Depression, unspecified: Secondary | ICD-10-CM

## 2011-09-04 DIAGNOSIS — F329 Major depressive disorder, single episode, unspecified: Secondary | ICD-10-CM

## 2011-09-04 DIAGNOSIS — I10 Essential (primary) hypertension: Secondary | ICD-10-CM

## 2011-09-04 DIAGNOSIS — F445 Conversion disorder with seizures or convulsions: Secondary | ICD-10-CM

## 2011-09-04 DIAGNOSIS — R195 Other fecal abnormalities: Secondary | ICD-10-CM

## 2011-09-04 DIAGNOSIS — J45909 Unspecified asthma, uncomplicated: Secondary | ICD-10-CM

## 2011-09-04 DIAGNOSIS — F419 Anxiety disorder, unspecified: Secondary | ICD-10-CM

## 2011-09-04 DIAGNOSIS — F341 Dysthymic disorder: Secondary | ICD-10-CM

## 2011-09-04 DIAGNOSIS — R569 Unspecified convulsions: Secondary | ICD-10-CM

## 2011-09-04 LAB — CBC WITH DIFFERENTIAL/PLATELET
Basophils Absolute: 0 10*3/uL (ref 0.0–0.1)
Basophils Relative: 1 % (ref 0–1)
Eosinophils Absolute: 0.2 10*3/uL (ref 0.0–0.7)
Eosinophils Relative: 3 % (ref 0–5)
HCT: 38.7 % (ref 36.0–46.0)
Hemoglobin: 12.7 g/dL (ref 12.0–15.0)
Lymphocytes Relative: 45 % (ref 12–46)
Lymphs Abs: 2.7 10*3/uL (ref 0.7–4.0)
MCH: 26.8 pg (ref 26.0–34.0)
MCHC: 32.8 g/dL (ref 30.0–36.0)
MCV: 81.8 fL (ref 78.0–100.0)
Monocytes Absolute: 0.4 10*3/uL (ref 0.1–1.0)
Monocytes Relative: 7 % (ref 3–12)
Neutro Abs: 2.7 10*3/uL (ref 1.7–7.7)
Neutrophils Relative %: 44 % (ref 43–77)
Platelets: 389 10*3/uL (ref 150–400)
RBC: 4.73 MIL/uL (ref 3.87–5.11)
RDW: 15.9 % — ABNORMAL HIGH (ref 11.5–15.5)
WBC: 6.1 10*3/uL (ref 4.0–10.5)

## 2011-09-04 LAB — BASIC METABOLIC PANEL
BUN: 7 mg/dL (ref 6–23)
CO2: 32 mEq/L (ref 19–32)
Calcium: 10.2 mg/dL (ref 8.4–10.5)
Chloride: 102 mEq/L (ref 96–112)
Creat: 0.63 mg/dL (ref 0.50–1.10)
Glucose, Bld: 96 mg/dL (ref 70–99)
Potassium: 4.9 mEq/L (ref 3.5–5.3)
Sodium: 142 mEq/L (ref 135–145)

## 2011-09-04 MED ORDER — ALBUTEROL SULFATE HFA 108 (90 BASE) MCG/ACT IN AERS: 2.0000 | INHALATION_SPRAY | Freq: Four times a day (QID) | RESPIRATORY_TRACT | Status: DC | PRN

## 2011-09-04 NOTE — Progress Notes (Signed)
Subjective:    Patient ID: Ruth Bell, female    DOB: Dec 19, 1960, 51 y.o.   MRN: [id number]  HPI  Ruth Bell is a 51 yr old female who presents today for follow up.   Anxiety-  Trying to work on her GED which she is happy about.   Had test last Wednesday. She reports some stress at home with a child who lives with them.  She reports one "seizure" last week.  She has an upcoming appointment with neurology.  She is taking citalopram without problems.   HTN- Continues ziac.  Ashtma has been stable.    Review of Systems   denies rectal bleeding. Stools are very dark. She is using centrum with minerals.    Past Medical History  Diagnosis Date  . Seizures     history of pseudoseizure disorder  . Migraine, unspecified, without mention of intractable migraine without mention of status migrainosus   . Anemia   . Menometrorrhagia 2006  . Uterine leiomyoma 2006  . Depression   . Hypertension   . Tubular adenoma of colon 05/2011    History   Social History  . Marital Status: Married    Spouse Name: [name]    Number of Children: N/A  . Years of Education: N/A   Occupational History  . CMA    Social History Main Topics  . Smoking status: Current Some Day Smoker -- 0.5 packs/day for 40 years    Types: Cigarettes  . Smokeless tobacco: Never Used  . Alcohol Use: No  . Drug Use: No  . Sexually Active: Yes    Birth Control/ Protection: None   Other Topics Concern  . Not on file   Social History Narrative  . No narrative on file    Past Surgical History  Procedure Date  . Appendectomy   . Bilateral salpingoophorectomy 04/06/04  . Abdominal hysterectomy 04/06/04  . Rotator cuff repair     Family History  Problem Relation Age of Onset  . Diabetes Other   . Hypertension Other   . Heart disease Mother   . Colon cancer Father     No Known Allergies  Current Outpatient Prescriptions on File Prior to Visit  Medication Sig Dispense Refill  .  bisoprolol-hydrochlorothiazide (ZIAC) 5-6.25 MG per tablet Take 1 tablet by mouth 2 (two) times daily.       . citalopram (CELEXA) 40 MG tablet Take 40 mg by mouth every morning.       . gabapentin (NEURONTIN) 600 MG tablet Take 600 mg by mouth 3 (three) times daily.        . Multiple Vitamin (MULITIVITAMIN WITH MINERALS) TABS Take 1 tablet by mouth daily.      Marland Kitchen omeprazole (PRILOSEC) 20 MG capsule Take 20 mg by mouth at bedtime.      . phenytoin (DILANTIN) 100 MG ER capsule Take 1 capsule (100 mg total) by mouth 3 (three) times daily.  90 capsule  1  . promethazine (PHENERGAN) 25 MG tablet Take 25 mg by mouth every 6 (six) hours as needed. For nausea.      . Tapentadol HCl (NUCYNTA) 75 MG TABS Take 1 tablet by mouth 2 (two) times daily as needed. For pain.      Marland Kitchen albuterol (PROVENTIL HFA;VENTOLIN HFA) 108 (90 BASE) MCG/ACT inhaler Inhale 2 puffs into the lungs every 6 (six) hours as needed for wheezing.  1 Inhaler  2    BP 124/80  Pulse 78  Temp 98.1  F (36.7 C) (Oral)  Resp 16  Ht 5\' 2"  (1.575 m)  Wt 110 lb 0.6 oz (49.914 kg)  BMI 20.13 kg/m2  SpO2 99%    Objective:   Physical Exam  Constitutional: She is oriented to person, place, and time. She appears well-developed and well-nourished. No distress.  Cardiovascular: Normal rate and regular rhythm.   No murmur heard. Pulmonary/Chest: Effort normal and breath sounds normal. No respiratory distress. She has no wheezes. She has no rales. She exhibits no tenderness.  Genitourinary:       Normal rectal exam.  Neurological: She is alert and oriented to person, place, and time.  Psychiatric: She has a normal mood and affect. Her behavior is normal. Judgment and thought content normal.          Assessment & Plan:

## 2011-09-04 NOTE — Patient Instructions (Addendum)
Please complete your stool kit an return to [name] at your earliest convenience.  Please schedule a follow up appointment in 3 months.

## 2011-09-05 ENCOUNTER — Telehealth: Payer: Self-pay | Admitting: Family

## 2011-09-05 LAB — PHENYTOIN LEVEL, TOTAL: Phenytoin Lvl: <0.5 ug/mL — ABNORMAL LOW (ref 10.0–20.0)

## 2011-09-05 NOTE — Telephone Encounter (Signed)
Attempted to reach pt , line busy

## 2011-09-05 NOTE — Telephone Encounter (Signed)
Please call pt and let her know that her lab work shows that she is not taking her dilantin.  I would like her to restart please. Kidney function and blood count look good.

## 2011-09-06 DIAGNOSIS — I10 Essential (primary) hypertension: Secondary | ICD-10-CM | POA: Insufficient documentation

## 2011-09-06 DIAGNOSIS — R195 Other fecal abnormalities: Secondary | ICD-10-CM | POA: Insufficient documentation

## 2011-09-06 NOTE — Assessment & Plan Note (Signed)
Stable.  Continue albuterol prn.  

## 2011-09-06 NOTE — Assessment & Plan Note (Signed)
Currently stable on citalopram.  Continue same.

## 2011-09-06 NOTE — Telephone Encounter (Signed)
Left message on home # to return my call. 

## 2011-09-06 NOTE — Assessment & Plan Note (Signed)
Attempted guaic stool today- unable to obtain sufficient sample from rectal vault.Likely due to centrum.  Obtain IFOB and cbc.

## 2011-09-06 NOTE — Assessment & Plan Note (Addendum)
Stable on ziac.  Continue same. Obtain bmet.  BP Readings from Last 3 Encounters:  09/04/11 124/80  06/19/11 116/70  06/13/11 94/66

## 2011-09-06 NOTE — Assessment & Plan Note (Signed)
Pt had pseudoseizure last week.  Obtain dilantin level.

## 2011-09-10 NOTE — Telephone Encounter (Signed)
Left message with female to have pt return my call.

## 2011-09-10 NOTE — Telephone Encounter (Signed)
Pt returned my call and was notified of instruction below. She reports that she takes her dilantin every day and does not miss doses. Spoke with Antonietta Jewel at [facility] and was advised that pt last filled rx on 04/17/11. Spoke to pt to verify if she is using another pharmacy and she states she uses [facility] on [address]. Has only had med refilled by Dr Hyacinth Meeker in the past and states she still has supply on hand and has not needed a refill.

## 2011-09-10 NOTE — Telephone Encounter (Signed)
The dilantin level was undetectable. Can't find any amount in the bloodstream suggesting not taking medication

## 2011-12-07 ENCOUNTER — Ambulatory Visit: Payer: 59 | Admitting: Family

## 2011-12-07 DIAGNOSIS — Z0289 Encounter for other administrative examinations: Secondary | ICD-10-CM

## 2012-01-16 ENCOUNTER — Other Ambulatory Visit: Payer: Self-pay | Admitting: Family

## 2012-01-16 DIAGNOSIS — Z1231 Encounter for screening mammogram for malignant neoplasm of breast: Secondary | ICD-10-CM

## 2012-02-04 ENCOUNTER — Ambulatory Visit (HOSPITAL_BASED_OUTPATIENT_CLINIC_OR_DEPARTMENT_OTHER): Payer: 59

## 2012-02-21 ENCOUNTER — Telehealth: Payer: Self-pay | Admitting: *Deleted

## 2012-02-21 ENCOUNTER — Encounter (HOSPITAL_COMMUNITY): Payer: Self-pay | Admitting: *Deleted

## 2012-02-21 ENCOUNTER — Observation Stay (HOSPITAL_COMMUNITY)
Admission: EM | Admit: 2012-02-21 | Discharge: 2012-02-25 | Disposition: A | Discharge status: Home / Self Care | Payer: 59 | Attending: Internal Medicine | Admitting: Internal Medicine

## 2012-02-21 DIAGNOSIS — Z79899 Other long term (current) drug therapy: Secondary | ICD-10-CM | POA: Insufficient documentation

## 2012-02-21 DIAGNOSIS — R634 Abnormal weight loss: Secondary | ICD-10-CM

## 2012-02-21 DIAGNOSIS — F329 Major depressive disorder, single episode, unspecified: Secondary | ICD-10-CM | POA: Diagnosis present

## 2012-02-21 DIAGNOSIS — R55 Syncope and collapse: Secondary | ICD-10-CM

## 2012-02-21 DIAGNOSIS — Z91148 Patient's other noncompliance with medication regimen for other reason: Secondary | ICD-10-CM

## 2012-02-21 DIAGNOSIS — Z86010 Personal history of colonic polyps: Secondary | ICD-10-CM

## 2012-02-21 DIAGNOSIS — R3129 Other microscopic hematuria: Secondary | ICD-10-CM

## 2012-02-21 DIAGNOSIS — F341 Dysthymic disorder: Secondary | ICD-10-CM | POA: Insufficient documentation

## 2012-02-21 DIAGNOSIS — Z9119 Patient's noncompliance with other medical treatment and regimen: Secondary | ICD-10-CM

## 2012-02-21 DIAGNOSIS — F445 Conversion disorder with seizures or convulsions: Secondary | ICD-10-CM

## 2012-02-21 DIAGNOSIS — M129 Arthropathy, unspecified: Secondary | ICD-10-CM

## 2012-02-21 DIAGNOSIS — R1084 Generalized abdominal pain: Secondary | ICD-10-CM

## 2012-02-21 DIAGNOSIS — Z9114 Patient's other noncompliance with medication regimen: Secondary | ICD-10-CM

## 2012-02-21 DIAGNOSIS — Z23 Encounter for immunization: Secondary | ICD-10-CM | POA: Insufficient documentation

## 2012-02-21 DIAGNOSIS — R195 Other fecal abnormalities: Secondary | ICD-10-CM

## 2012-02-21 DIAGNOSIS — F419 Anxiety disorder, unspecified: Secondary | ICD-10-CM

## 2012-02-21 DIAGNOSIS — F32A Depression, unspecified: Secondary | ICD-10-CM | POA: Diagnosis present

## 2012-02-21 DIAGNOSIS — M7918 Myalgia, other site: Secondary | ICD-10-CM

## 2012-02-21 DIAGNOSIS — B9689 Other specified bacterial agents as the cause of diseases classified elsewhere: Secondary | ICD-10-CM

## 2012-02-21 DIAGNOSIS — J45909 Unspecified asthma, uncomplicated: Secondary | ICD-10-CM

## 2012-02-21 DIAGNOSIS — R569 Unspecified convulsions: Principal | ICD-10-CM

## 2012-02-21 DIAGNOSIS — N390 Urinary tract infection, site not specified: Secondary | ICD-10-CM

## 2012-02-21 DIAGNOSIS — Z860101 Personal history of adenomatous and serrated colon polyps: Secondary | ICD-10-CM

## 2012-02-21 DIAGNOSIS — I10 Essential (primary) hypertension: Secondary | ICD-10-CM

## 2012-02-21 DIAGNOSIS — G43909 Migraine, unspecified, not intractable, without status migrainosus: Secondary | ICD-10-CM

## 2012-02-21 DIAGNOSIS — R0601 Orthopnea: Secondary | ICD-10-CM

## 2012-02-21 DIAGNOSIS — G40909 Epilepsy, unspecified, not intractable, without status epilepticus: Secondary | ICD-10-CM | POA: Diagnosis present

## 2012-02-21 DIAGNOSIS — M5410 Radiculopathy, site unspecified: Secondary | ICD-10-CM

## 2012-02-21 DIAGNOSIS — B353 Tinea pedis: Secondary | ICD-10-CM

## 2012-02-21 DIAGNOSIS — D649 Anemia, unspecified: Secondary | ICD-10-CM

## 2012-02-21 LAB — URINALYSIS, ROUTINE W REFLEX MICROSCOPIC
Bilirubin Urine: NEGATIVE
Glucose, UA: NEGATIVE mg/dL
Ketones, ur: NEGATIVE mg/dL
Leukocytes, UA: NEGATIVE
Nitrite: NEGATIVE
Protein, ur: NEGATIVE mg/dL
Specific Gravity, Urine: 1.015 (ref 1.005–1.030)
Urobilinogen, UA: 0.2 mg/dL (ref 0.0–1.0)
pH: 6 (ref 5.0–8.0)

## 2012-02-21 LAB — CBC
HCT: 36.7 % (ref 36.0–46.0)
Hemoglobin: 12.7 g/dL (ref 12.0–15.0)
MCH: 28 pg (ref 26.0–34.0)
MCHC: 34.6 g/dL (ref 30.0–36.0)
MCV: 80.8 fL (ref 78.0–100.0)
Platelets: 278 10*3/uL (ref 150–400)
RBC: 4.54 MIL/uL (ref 3.87–5.11)
RDW: 14.7 % (ref 11.5–15.5)
WBC: 7.2 10*3/uL (ref 4.0–10.5)

## 2012-02-21 LAB — BASIC METABOLIC PANEL
BUN: 7 mg/dL (ref 6–23)
CO2: 24 mEq/L (ref 19–32)
Calcium: 9 mg/dL (ref 8.4–10.5)
Chloride: 103 mEq/L (ref 96–112)
Creatinine, Ser: 0.5 mg/dL (ref 0.50–1.10)
GFR calc Af Amer: >90 mL/min (ref >90)
GFR calc non Af Amer: >90 mL/min (ref >90)
Glucose, Bld: 118 mg/dL — ABNORMAL HIGH (ref 70–99)
Potassium: 3.4 mEq/L — ABNORMAL LOW (ref 3.5–5.1)
Sodium: 138 mEq/L (ref 135–145)

## 2012-02-21 LAB — URINE MICROSCOPIC-ADD ON

## 2012-02-21 LAB — PHENYTOIN LEVEL, TOTAL: Phenytoin Lvl: <2.5 ug/mL — ABNORMAL LOW (ref 10.0–20.0)

## 2012-02-21 LAB — CK: Total CK: 68 U/L (ref 7–177)

## 2012-02-21 MED ORDER — ENOXAPARIN SODIUM 40 MG/0.4ML ~~LOC~~ SOLN
40.0000 mg | SUBCUTANEOUS | Status: DC
  Administered 2012-02-21: 40 mg via SUBCUTANEOUS
  Filled 2012-02-21 (×2): qty 0.4

## 2012-02-21 MED ORDER — LORAZEPAM 2 MG/ML IJ SOLN
0.5000 mg | Freq: Once | INTRAMUSCULAR | Status: AC
  Administered 2012-02-21: 0.5 mg via INTRAVENOUS

## 2012-02-21 MED ORDER — ACETAMINOPHEN 325 MG PO TABS
650.0000 mg | ORAL_TABLET | Freq: Once | ORAL | Status: DC
  Filled 2012-02-21 (×2): qty 2

## 2012-02-21 MED ORDER — LORAZEPAM 2 MG/ML IJ SOLN
0.2500 mg | INTRAMUSCULAR | Status: DC | PRN
  Administered 2012-02-24 – 2012-02-25 (×2): 0.25 mg via INTRAVENOUS
  Filled 2012-02-21 (×3): qty 1

## 2012-02-21 MED ORDER — ALBUTEROL SULFATE HFA 108 (90 BASE) MCG/ACT IN AERS: 2.0000 | INHALATION_SPRAY | Freq: Four times a day (QID) | RESPIRATORY_TRACT | Status: DC | PRN

## 2012-02-21 MED ORDER — LORAZEPAM 2 MG/ML IJ SOLN
1.0000 mg | Freq: Once | INTRAMUSCULAR | Status: AC
  Administered 2012-02-21: 1 mg via INTRAVENOUS
  Filled 2012-02-21: qty 1

## 2012-02-21 MED ORDER — PHENYTOIN SODIUM 50 MG/ML IJ SOLN
1000.0000 mg | Freq: Once | INTRAMUSCULAR | Status: AC
  Administered 2012-02-21: 1000 mg via INTRAVENOUS
  Filled 2012-02-21: qty 20

## 2012-02-21 MED ORDER — GABAPENTIN 300 MG PO CAPS
600.0000 mg | ORAL_CAPSULE | Freq: Three times a day (TID) | ORAL | Status: DC
  Administered 2012-02-21 – 2012-02-25 (×10): 600 mg via ORAL
  Filled 2012-02-21 (×14): qty 2

## 2012-02-21 MED ORDER — ACETAMINOPHEN 325 MG PO TABS
ORAL_TABLET | ORAL | Status: AC
  Filled 2012-02-21: qty 2

## 2012-02-21 MED ORDER — ONDANSETRON HCL 4 MG/2ML IJ SOLN: 4.0000 mg | Freq: Four times a day (QID) | INTRAMUSCULAR | Status: DC | PRN

## 2012-02-21 MED ORDER — PANTOPRAZOLE SODIUM 40 MG PO TBEC
40.0000 mg | DELAYED_RELEASE_TABLET | Freq: Every day | ORAL | Status: DC
  Administered 2012-02-21 – 2012-02-25 (×5): 40 mg via ORAL
  Filled 2012-02-21 (×5): qty 1

## 2012-02-21 MED ORDER — LORAZEPAM 2 MG/ML IJ SOLN
INTRAMUSCULAR | Status: AC
  Administered 2012-02-21: 1 mg via INTRAVENOUS
  Filled 2012-02-21: qty 1

## 2012-02-21 MED ORDER — PHENYTOIN SODIUM EXTENDED 100 MG PO CAPS
100.0000 mg | ORAL_CAPSULE | Freq: Three times a day (TID) | ORAL | Status: DC
  Administered 2012-02-21 – 2012-02-22 (×3): 100 mg via ORAL
  Filled 2012-02-21 (×5): qty 1

## 2012-02-21 MED ORDER — RISPERIDONE 1 MG PO TBDP
1.0000 mg | ORAL_TABLET | Freq: Two times a day (BID) | ORAL | Status: DC
  Administered 2012-02-21 – 2012-02-24 (×7): 1 mg via ORAL
  Filled 2012-02-21 (×9): qty 1

## 2012-02-21 MED ORDER — ONDANSETRON HCL 4 MG PO TABS: 4.0000 mg | ORAL_TABLET | Freq: Four times a day (QID) | ORAL | Status: DC | PRN

## 2012-02-21 MED ORDER — LORAZEPAM 2 MG/ML IJ SOLN
INTRAMUSCULAR | Status: AC
  Administered 2012-02-21: 0.5 mg via INTRAVENOUS
  Filled 2012-02-21: qty 1

## 2012-02-21 MED ORDER — CITALOPRAM HYDROBROMIDE 40 MG PO TABS
40.0000 mg | ORAL_TABLET | Freq: Every morning | ORAL | Status: DC
  Administered 2012-02-22 – 2012-02-25 (×4): 40 mg via ORAL
  Filled 2012-02-21 (×4): qty 1

## 2012-02-21 MED ORDER — ACETAMINOPHEN 325 MG PO TABS
650.0000 mg | ORAL_TABLET | Freq: Four times a day (QID) | ORAL | Status: DC | PRN
  Administered 2012-02-21 – 2012-02-25 (×6): 650 mg via ORAL
  Filled 2012-02-21 (×5): qty 2

## 2012-02-21 MED ORDER — LORAZEPAM 2 MG/ML IJ SOLN
1.0000 mg | Freq: Once | INTRAMUSCULAR | Status: AC
  Administered 2012-02-21: 1 mg via INTRAVENOUS

## 2012-02-21 MED ORDER — ADULT MULTIVITAMIN W/MINERALS CH
1.0000 | ORAL_TABLET | Freq: Every day | ORAL | Status: DC
  Administered 2012-02-22 – 2012-02-25 (×4): 1 via ORAL
  Filled 2012-02-21 (×4): qty 1

## 2012-02-21 NOTE — ED Notes (Addendum)
Pt had grand mal seizure witnessed by this RN at 1005. Lasting approx 30 seconds. Ativan IVP 1mg  given per protocol. Powers, EDP made aware and new orders received. Pt remains post-ictal at this time.

## 2012-02-21 NOTE — Progress Notes (Signed)
02/21/12 1701 Lab called about CBC and creatinine labs. Baseline was drawn at 1010 on 02/21/12. No labs needed until morning.

## 2012-02-21 NOTE — ED Provider Notes (Signed)
History     CSN: [id number]  Arrival date & time 02/21/12  0911   First MD Initiated Contact with Patient 02/21/12 0913      Chief Complaint  Patient presents with  . Seizures    (Consider location/radiation/quality/duration/timing/severity/associated sxs/prior treatment) Patient is a 51 y.o. female presenting with seizures. The history is provided by the patient. No language interpreter was used.  Seizures  This is a new problem. The current episode started 1 to 2 hours ago. The problem has been rapidly improving. There were 2 to 3 seizures. The most recent episode lasted 30 to 120 seconds. Associated symptoms include confusion. Pertinent negatives include no sleepiness, no headaches, no speech difficulty, no visual disturbance, no neck stiffness, no chest pain, no cough, no nausea, no vomiting, no diarrhea and no muscle weakness. Characteristics include eye blinking, rhythmic jerking and loss of consciousness. Characteristics do not include bit tongue, apnea or cyanosis. The episode was witnessed (the 1st one was not.  pt recovered and was confused.  EMS witnessed the 2nd and 3rd.). The seizure(s) had no focality. Possible causes include missed seizure meds. Possible causes do not include med or dosage change, sleep deprivation, recent illness or change in alcohol use. There has been no fever.    Past Medical History  Diagnosis Date  . Seizures     history of pseudoseizure disorder  . Migraine, unspecified, without mention of intractable migraine without mention of status migrainosus   . Anemia   . Menometrorrhagia 2006  . Uterine leiomyoma 2006  . Depression   . Hypertension   . Tubular adenoma of colon 05/2011    Past Surgical History  Procedure Date  . Appendectomy   . Bilateral salpingoophorectomy 04/06/04  . Abdominal hysterectomy 04/06/04  . Rotator cuff repair     Family History  Problem Relation Age of Onset  . Diabetes Other   . Hypertension Other   . Heart  disease Mother   . Colon cancer Father     History  Substance Use Topics  . Smoking status: Current Some Day Smoker -- 0.5 packs/day for 40 years    Types: Cigarettes  . Smokeless tobacco: Never Used  . Alcohol Use: No    OB History    Grav Para Term Preterm Abortions TAB SAB Ect Mult Living                  Review of Systems  Eyes: Negative for visual disturbance.  Respiratory: Negative for apnea and cough.   Cardiovascular: Negative for chest pain and cyanosis.  Gastrointestinal: Negative for nausea, vomiting and diarrhea.  Neurological: Positive for seizures and loss of consciousness. Negative for speech difficulty and headaches.  Psychiatric/Behavioral: Positive for confusion.  All other systems reviewed and are negative.    Allergies  Review of patient's allergies indicates no known allergies.  Home Medications   Current Outpatient Rx  Name  Route  Sig  Dispense  Refill  . ALBUTEROL SULFATE HFA 108 (90 BASE) MCG/ACT IN AERS   Inhalation   Inhale 2 puffs into the lungs every 6 (six) hours as needed for wheezing.   1 Inhaler   2   . CITALOPRAM HYDROBROMIDE 40 MG PO TABS   Oral   Take 40 mg by mouth every morning.          Marland Kitchen GABAPENTIN 600 MG PO TABS   Oral   Take 600 mg by mouth 3 (three) times daily.          Marland Kitchen  OMEPRAZOLE 20 MG PO CPDR   Oral   Take 20 mg by mouth at bedtime.         Marland Kitchen RISPERIDONE 1 MG PO TBDP   Oral   Take 1 mg by mouth 2 (two) times daily.         Marland Kitchen BISOPROLOL-HYDROCHLOROTHIAZIDE 5-6.25 MG PO TABS   Oral   Take 1 tablet by mouth 2 (two) times daily.          . ADULT MULTIVITAMIN W/MINERALS CH   Oral   Take 1 tablet by mouth daily.         Marland Kitchen PHENYTOIN SODIUM EXTENDED 100 MG PO CAPS   Oral   Take 1 capsule (100 mg total) by mouth 3 (three) times daily.   90 capsule   1   . PROMETHAZINE HCL 25 MG PO TABS   Oral   Take 25 mg by mouth every 6 (six) hours as needed. For nausea.         Marland Kitchen RISPERIDONE 1 MG PO  TBDP   Oral   Take 1 tablet (1 mg total) by mouth at bedtime.   30 tablet   0   . TAPENTADOL HCL 75 MG PO TABS   Oral   Take 1 tablet by mouth 2 (two) times daily as needed. For pain.           BP 133/98  Pulse 82  Temp 98.8 F (37.1 C) (Oral)  Resp 20  SpO2 98%  Physical Exam  Nursing note and vitals reviewed. Constitutional: She is oriented to person, place, and time. She appears well-developed and well-nourished. No distress.  HENT:  Head: Normocephalic and atraumatic.  Right Ear: External ear normal.  Left Ear: External ear normal.  Nose: Nose normal.  Mouth/Throat: Oropharynx is clear and moist. No oropharyngeal exudate.  Eyes: Conjunctivae normal and EOM are normal. Pupils are equal, round, and reactive to light. Right eye exhibits no discharge. Left eye exhibits no discharge. No scleral icterus.       No nystagmus   Neck: Normal range of motion. Neck supple. No JVD present. No tracheal deviation present.  Cardiovascular: Normal rate, regular rhythm, normal heart sounds and intact distal pulses.  Exam reveals no gallop and no friction rub.   No murmur heard. Pulmonary/Chest: Effort normal and breath sounds normal. No stridor. No respiratory distress. She has no wheezes. She has no rales. She exhibits no tenderness.  Abdominal: Soft. Bowel sounds are normal. She exhibits no distension and no mass. There is no tenderness. There is no rebound and no guarding.  Musculoskeletal: Normal range of motion. She exhibits no edema and no tenderness.  Lymphadenopathy:    She has no cervical adenopathy.  Neurological: She is alert and oriented to person, place, and time. No cranial nerve deficit. She exhibits normal muscle tone. Coordination normal.       Pt has some very mild confusion but at the same time is alert and oriented to person, place, time of day.   Skin: Skin is warm and dry. No rash noted. She is not diaphoretic. No erythema. No pallor.  Psychiatric: She has a  normal mood and affect. Her behavior is normal.    ED Course  Procedures (including critical care time)   Labs Reviewed  CBC  BASIC METABOLIC PANEL  URINALYSIS, ROUTINE W REFLEX MICROSCOPIC  PHENYTOIN LEVEL, TOTAL   No results found.   No diagnosis found.   Date: 02/21/2012  Rate: 76 bpm  Rhythm: sinus  QRS Axis: normal  Intervals: normal  ST/T Wave abnormalities: normal  Conduction Disutrbances:none  Narrative Interpretation:   Old EKG Reviewed: unchanged      MDM  Pt presents for evaluation of seizures.  She reports she may have run out of the dilantin and has had 3 seizures this morning.  She is currently alert and oriented, note stable VS, NAD.  Will obtain basic labs, a dilantin level, and reassess as the labs become available.  1230.  Pt has had an additional seizure since arriving in the ER.  Her dilantin level is noted to be sub therapeutic.  Will load 1 gram of dilantin IV.  If she has no further seizures and she returns to her baseline, will discharge home after completion of the load.  1420.  Pt has had additional seizure activity.  The dilantin load has completed.  Secondary to 4-5 witnessed seizures including 3 since arriving in the ER, plan admit for observation and further mgmnt.  Discussed her evaluation with Dr. Rito Ehrlich.       Tobin Chad, MD 02/21/12 [phone]

## 2012-02-21 NOTE — Progress Notes (Signed)
Pt transported to Rm 1415 via wheelchair. Upon attempt to transfer pt to bed, pt began to have seizure activity that lasted for about a minute. Pt remained in wheelchair during this time. O2 placed on patient for support. Pt had generalized jerking movements, most notably visualized in the R arm. Activity was witnessed by this Clinical research associate, Inetta Fermo, RN, and [place], [place].

## 2012-02-21 NOTE — H&P (Signed)
[facility] History and Physical  [name]:[id number] DOB: December 25, 1960 DOA: 02/21/2012  Referring physician: Dr. Lorenso Courier, ER physician PCP: Lemont Fillers., NP  Specialists: Neuro hospitalist-consult pending  Chief Complaint: Seizures  HPI: Ruth Bell is a 51 y.o. female  Past oral history of seizure disorder, history of noncompliance, hypertension who reported as not been taking her Dilantin for quite some time and in was brought in after she started having seizures today. Reportedly she has seizure and after it became concerned and called primary care physician advised her to call 911 the patient was transported here. The emergency room, she was evaluated with normal blood work and a normal CT scan of the head. Dilantin level however was practically nonexistent. She continued to have several seizures in the emergency room which were witnessed and she was given Ativan for this. I spoke with patient's husband who took the patient is or noncompliant with all of her medications because she does not like her to make her feel.-Very drowsy. During my exam, she had a generalized seizure in front of me. Her lf-contained lasting approximately 30 seconds  Review of Systems: Patient is somewhat drowsy and somnolent, unable to give me any kind of review of systems.  Past Medical History  Diagnosis Date  . Seizures     history of pseudoseizure disorder  . Migraine, unspecified, without mention of intractable migraine without mention of status migrainosus   . Anemia   . Menometrorrhagia 2006  . Uterine leiomyoma 2006  . Depression   . Hypertension   . Tubular adenoma of colon 05/2011   Past Surgical History  Procedure Date  . Appendectomy   . Bilateral salpingoophorectomy 04/06/04  . Abdominal hysterectomy 04/06/04  . Rotator cuff repair    Social History:  reports that she has been smoking Cigarettes.  She has a 20 pack-year smoking history. She has never used smokeless  tobacco. She reports that she does not drink alcohol or use illicit drugs. Patient lives at home with her husband. She is normally able to participate in most activities of daily living.  No Known Allergies  Family History  Problem Relation Age of Onset  . Diabetes Other   . Hypertension Other   . Heart disease Mother   . Colon cancer Father     Prior to Admission medications   Medication Sig Start Date End Date Taking? Authorizing Provider  albuterol (PROVENTIL HFA;VENTOLIN HFA) 108 (90 BASE) MCG/ACT inhaler Inhale 2 puffs into the lungs every 6 (six) hours as needed for wheezing. 09/04/11 09/03/12 Yes Sandford Craze, NP  citalopram (CELEXA) 40 MG tablet Take 40 mg by mouth every morning.    Yes Historical Provider, MD  gabapentin (NEURONTIN) 600 MG tablet Take 600 mg by mouth 3 (three) times daily.    Yes Historical Provider, MD  metroNIDAZOLE (METROGEL) 0.75 % gel Apply 1 application topically 2 (two) times daily.   Yes Historical Provider, MD  Multiple Vitamin (MULITIVITAMIN WITH MINERALS) TABS Take 1 tablet by mouth daily.   Yes Historical Provider, MD  omeprazole (PRILOSEC) 20 MG capsule Take 20 mg by mouth at bedtime. 01/05/11  Yes Sandford Craze, NP  phenytoin (DILANTIN) 100 MG ER capsule Take 1 capsule (100 mg total) by mouth 3 (three) times daily. 04/23/11 04/22/12 Yes Sandford Craze, NP  promethazine (PHENERGAN) 25 MG tablet Take 25 mg by mouth every 6 (six) hours as needed. For nausea.   Yes Historical Provider, MD  risperiDONE (RISPERDAL M-TABS) 1 MG disintegrating tablet  Take 1 mg by mouth 2 (two) times daily.   Yes Historical Provider, MD  Tapentadol HCl (NUCYNTA) 75 MG TABS Take 1 tablet by mouth 2 (two) times daily as needed. For pain.   Yes Historical Provider, MD  risperiDONE (RISPERDAL M-TABS) 1 MG disintegrating tablet Take 1 tablet (1 mg total) by mouth at bedtime. 06/13/11 07/13/11  Sammuel Cooper, PA   Physical Exam: Filed Vitals:   02/21/12 1330 02/21/12  1430 02/21/12 1530 02/21/12 1644  BP: 153/93 119/82 126/73 155/75  Pulse: 77 72 68 91  Temp:    97.7 F (36.5 C)  TempSrc:    Oral  Resp: 17 18 19 20   Height:    5\' 2"  (1.575 m)  Weight:    46.6 kg (102 lb 11.8 oz)  SpO2: 100% 100% 100% 100%     General:   drowsy, response and   Eyes:Sclera nonicteric her   ENT: Normocephalic, atraumatic her neck is supple no JVD  Heart regular rate and rhythm, S1-S2  Respiratory: CTA Bilat  Abdomen: Soft, NT, ND, BS  Skin: No skin breaks, tears or lesions  Musculoskeletal: No edema  Psychiatric: Drowsy, but no evidence of psychosis  Neurologic: Difficult to get neuro exam due to ?post-ictal state  Labs on Admission:  Basic Metabolic Panel:  Lab 02/21/12 4098  NA 138  K 3.4*  CL 103  CO2 24  GLUCOSE 118*  BUN 7  CREATININE 0.50  CALCIUM 9.0  MG --  PHOS --   CBC:  Lab 02/21/12 1010  WBC 7.2  NEUTROABS --  HGB 12.7  HCT 36.7  MCV 80.8  PLT 278    EKG: Independently reviewed. NSR  Assessment/Plan Principal Problem:  *Seizure disorder: We'll start IV Dilantin as per pharmacy. The question is is this truly from recurrent seizures because of noncompliance versus pseudoseizures. Will check a CPK and prolactin level and asked neuropathy per list for consult. Will also check EEG. I have a suspicion, this may be more pseudo-related Active Problems:  Anxiety and depression: Continue Risperdal  HTN (hypertension): Continue home medications  Non compliance w medication regimen    Code Status: Patient herself unable to clarify. Husband states full code  Family Communication: Discussed plan with husband at the bedside  Disposition Plan: Likely discharge tomorrow  Time spent: 30 minutes  Hollice Espy [facility] Pager [phone]  If 7PM-7AM, please contact night-coverage [email or web address] Password [facility] 02/21/2012, 4:51 PM

## 2012-02-21 NOTE — Progress Notes (Signed)
MEDICATION RELATED CONSULT NOTE - INITIAL   Pharmacy Consult for Phenytoin Indication: seizures  No Known Allergies  Patient Measurements: Height: 5\' 2"  (157.5 cm) Weight: 102 lb 11.8 oz (46.6 kg) IBW/kg (Calculated) : 50.1   Vital Signs: Temp: 97.7 F (36.5 C) (12/05 1644) Temp src: Oral (12/05 1644) BP: 155/75 mmHg (12/05 1644) Pulse Rate: 91  (12/05 1644) Intake/Output from previous day:   Intake/Output from this shift:    Labs:  Basename 02/21/12 1010  WBC 7.2  HGB 12.7  HCT 36.7  PLT 278  APTT --  CREATININE 0.50  LABCREA --  CREATININE 0.50  CREAT24HRUR --  MG --  PHOS --  ALBUMIN --  PROT --  ALBUMIN --  AST --  ALT --  ALKPHOS --  BILITOT --  BILIDIR --  IBILI --   Estimated Creatinine Clearance: 59.8 ml/min (by C-G formula based on Cr of 0.5).   Microbiology: No results found for this or any previous visit (from the past 720 hour(s)).  Medical History: Past Medical History  Diagnosis Date  . Seizures     history of pseudoseizure disorder  . Migraine, unspecified, without mention of intractable migraine without mention of status migrainosus   . Anemia   . Menometrorrhagia 2006  . Uterine leiomyoma 2006  . Depression   . Hypertension   . Tubular adenoma of colon 05/2011   Medications:  Prescriptions prior to admission  Medication Sig Dispense Refill  . albuterol (PROVENTIL HFA;VENTOLIN HFA) 108 (90 BASE) MCG/ACT inhaler Inhale 2 puffs into the lungs every 6 (six) hours as needed for wheezing.  1 Inhaler  2  . citalopram (CELEXA) 40 MG tablet Take 40 mg by mouth every morning.       . gabapentin (NEURONTIN) 600 MG tablet Take 600 mg by mouth 3 (three) times daily.       . metroNIDAZOLE (METROGEL) 0.75 % gel Apply 1 application topically 2 (two) times daily.      . Multiple Vitamin (MULITIVITAMIN WITH MINERALS) TABS Take 1 tablet by mouth daily.      Marland Kitchen omeprazole (PRILOSEC) 20 MG capsule Take 20 mg by mouth at bedtime.      . phenytoin  (DILANTIN) 100 MG ER capsule Take 1 capsule (100 mg total) by mouth 3 (three) times daily.  90 capsule  1  . promethazine (PHENERGAN) 25 MG tablet Take 25 mg by mouth every 6 (six) hours as needed. For nausea.      . risperiDONE (RISPERDAL M-TABS) 1 MG disintegrating tablet Take 1 mg by mouth 2 (two) times daily.      . Tapentadol HCl (NUCYNTA) 75 MG TABS Take 1 tablet by mouth 2 (two) times daily as needed. For pain.      Marland Kitchen risperiDONE (RISPERDAL M-TABS) 1 MG disintegrating tablet Take 1 tablet (1 mg total) by mouth at bedtime.  30 tablet  0   Scheduled:    . acetaminophen  650 mg Oral Once  . citalopram  40 mg Oral q morning - 10a  . enoxaparin (LOVENOX) injection  40 mg Subcutaneous Q24H  . gabapentin  600 mg Oral TID  . [COMPLETED] LORazepam  0.5 mg Intravenous Once  . [COMPLETED] LORazepam  1 mg Intravenous Once  . [COMPLETED] LORazepam  1 mg Intravenous Once  . multivitamin with minerals  1 tablet Oral Daily  . pantoprazole  40 mg Oral Daily  . [COMPLETED] phenytoin (DILANTIN) IV  1,000 mg Intravenous Once  . risperiDONE  1 mg  Oral BID    Assessment:  51yo F admitted from home after 2-3 witnessed grand mal seizures 12/5.  On phenytoin PTA but ran out of medication approximately 2 weeks ago.  Received phenytoin 1g IV in the ED at 13:00.  Goal of Therapy:  Prevention of seizures. Phenytoin level 10-75mcg/ml.  Plan:   Since home regimen is known and tolerating PO meds, will resume phenytoin 100mg  PO q8h tonight.  F/u daily and check a phenytoin level at [facility].  Charolotte Eke, PharmD, pager [phone]. 02/21/2012,5:09 PM.

## 2012-02-21 NOTE — Progress Notes (Signed)
Patient being settled into new room-just received from 3rd floor. Patient starts to cry and voices ideations of "ending my life" to staff. States "I can't do anything for my household. I can't do anything for my grandchildren". House A/C notified and suicide sitter placed in room instantly. Security notified to wand patient and items removed from room per suicide precaution policy. Will continue to monitor.  Ginny Forth

## 2012-02-21 NOTE — ED Notes (Signed)
Pt in from home by ems. Has had 2-3 witnessed grand mal seizures this am. Pt has Hx seizures. Pt no longer post-ictal upon arrival to ED, c/o generalized weakness., A&Ox4. Denies hitting head, trauma.

## 2012-02-21 NOTE — ED Notes (Signed)
Pt requesting medication for headache after seizure. Informed EDP and went to get medication. Upon returning to pt's room, pt very lethargic again. Sat up in bed as if she was confused and then slowly closed her eyes and went very limp and was assisted back to a lying position in bed. Noted to be snoring. VSS. Will hold pain medication or PO intake at this time.

## 2012-02-21 NOTE — ED Notes (Signed)
Pt witnessed having another seizure lasting 30-45 seconds. Powers, EDP made aware. Orders received. Pt medicated.

## 2012-02-21 NOTE — Progress Notes (Signed)
02/21/12 1620 Patient arrived to the floor with no belongings.

## 2012-02-21 NOTE — ED Notes (Signed)
No additional seizure activity noted since pt's last seizure and ativan administration. Pt asleep. VSS.

## 2012-02-21 NOTE — ED Notes (Signed)
Pt received 5mg  Versed en route from ems.

## 2012-02-21 NOTE — Telephone Encounter (Signed)
Received call from pt stating she has been confused for the last 2 days. Denies pain or weakness of extremities or any other symptoms. Pt was very upset, scared and crying. Pt is unable to tell me what today is or what her birthday is. Pt reports that her Harlow Mares is with her but is sleeping. I advised pt I would call 911 and pt asked that I call her husband and hung up the phone. Activated 911, unable to reach pt's husband and left message to return my call on his cell#. Attempted to reach pt's husband again to notify him of actions that had been taken. Spoke to [place] and advised him of situation. Mr Breiner voiced concern that I had activated 911 without speaking with him. Advised him that under the circumstances and due to the fact that I could not reach him 911 was activated.

## 2012-02-22 ENCOUNTER — Inpatient Hospital Stay (HOSPITAL_COMMUNITY)
Admit: 2012-02-22 | Discharge: 2012-02-22 | Disposition: A | Discharge status: Home / Self Care | Payer: 59 | Attending: Internal Medicine | Admitting: Internal Medicine

## 2012-02-22 DIAGNOSIS — D649 Anemia, unspecified: Secondary | ICD-10-CM

## 2012-02-22 DIAGNOSIS — R55 Syncope and collapse: Secondary | ICD-10-CM

## 2012-02-22 LAB — BASIC METABOLIC PANEL
BUN: 6 mg/dL (ref 6–23)
CO2: 27 mEq/L (ref 19–32)
Calcium: 8.8 mg/dL (ref 8.4–10.5)
Chloride: 105 mEq/L (ref 96–112)
Creatinine, Ser: 0.62 mg/dL (ref 0.50–1.10)
GFR calc Af Amer: >90 mL/min (ref >90)
GFR calc non Af Amer: >90 mL/min (ref >90)
Glucose, Bld: 90 mg/dL (ref 70–99)
Potassium: 3.5 mEq/L (ref 3.5–5.1)
Sodium: 142 mEq/L (ref 135–145)

## 2012-02-22 LAB — PHENYTOIN LEVEL, TOTAL: Phenytoin Lvl: 26.1 ug/mL — ABNORMAL HIGH (ref 10.0–20.0)

## 2012-02-22 LAB — PROLACTIN: Prolactin: 9.7 ng/mL

## 2012-02-22 LAB — ALBUMIN: Albumin: 3.4 g/dL — ABNORMAL LOW (ref 3.5–5.2)

## 2012-02-22 MED ORDER — AMMONIA AROMATIC IN INHA
RESPIRATORY_TRACT | Status: AC
  Administered 2012-02-22: 1 mL
  Filled 2012-02-22: qty 20

## 2012-02-22 MED ORDER — METOPROLOL TARTRATE 12.5 MG HALF TABLET
12.5000 mg | ORAL_TABLET | Freq: Two times a day (BID) | ORAL | Status: DC
  Administered 2012-02-22 – 2012-02-23 (×3): 12.5 mg via ORAL
  Filled 2012-02-22 (×4): qty 1

## 2012-02-22 MED ORDER — ENOXAPARIN SODIUM 30 MG/0.3ML ~~LOC~~ SOLN
30.0000 mg | SUBCUTANEOUS | Status: DC
Start: 2012-02-22 — End: 2012-02-25
  Administered 2012-02-22 – 2012-02-24 (×3): 30 mg via SUBCUTANEOUS
  Filled 2012-02-22 (×4): qty 0.3

## 2012-02-22 MED ORDER — PNEUMOCOCCAL VAC POLYVALENT 25 MCG/0.5ML IJ INJ
0.5000 mL | INJECTION | INTRAMUSCULAR | Status: AC
  Administered 2012-02-23: 0.5 mL via INTRAMUSCULAR
  Filled 2012-02-22: qty 0.5

## 2012-02-22 NOTE — Progress Notes (Signed)
Patient has awakened and c/o headache. New onset left mouth droop. Pupil reactive and equal. Grips equal but weak. TR[facility] on call notified of new findings. Continue to monitor. Ruth Bell

## 2012-02-22 NOTE — Care Management Note (Signed)
    Page 1 of 1   02/25/2012     4:37:28 PM   CARE MANAGEMENT NOTE 02/25/2012  Patient:  Ruth Bell, Ruth Bell   Account Number:  [id number]  Date Initiated:  02/22/2012  Documentation initiated by:  Lanier Clam  Subjective/Objective Assessment:   ADMITTED W/SEIZURES.WU:[id number].     Action/Plan:   FROM HOME W/SPOUSE.HAS PCP,PHARMACY.   Anticipated DC Date:  02/25/2012   Anticipated DC Plan:  HOME W HOME HEALTH SERVICES      DC Planning Services  CM consult      Choice offered to / List presented to:  C-1 Patient        HH arranged  HH-1 RN  HH-2 PT      [facility] agency  [facility]   Status of service:  Completed, signed off Medicare Important Message given?   (If response is "NO", the following Medicare IM given date fields will be blank) Date Medicare IM given:   Date Additional Medicare IM given:    Discharge Disposition:  HOME W HOME HEALTH SERVICES  Per UR Regulation:  Reviewed for med. necessity/level of care/duration of stay  If discussed at Long Length of Stay Meetings, dates discussed:    Comments:  02/25/12 Patricio Popwell RN,BSN NCM [phone] [facility] CHOSEN FOR HHRN,[name] LIASON INFORMED OF HHRN/PT,& D/C.  02/22/12 Worth Kober RN,BSN NCM [phone] NEURO CONS.

## 2012-02-22 NOTE — Plan of Care (Signed)
Problem: Phase I Progression Outcomes Goal: OOB as tolerated unless otherwise ordered Outcome: Progressing Pt ambulatory with assist. Gait unsteady at this time.

## 2012-02-22 NOTE — Procedures (Signed)
ELECTROENCEPHALOGRAM REPORT   Patient: Ruth Bell       Room #: [id number] EEG No. ID: [id number] Age: 51 y.o.        Sex: female Referring Physician: Rai Report Date:  02/22/2012        Interpreting Physician: Thana Farr D  History: [name] is an 51 y.o. female with shaking episodes evaluated to rule out seizure  Medications:  Scheduled:   . acetaminophen  650 mg Oral Once  . [COMPLETED] ammonia      . citalopram  40 mg Oral q morning - 10a  . enoxaparin (LOVENOX) injection  30 mg Subcutaneous Q24H  . gabapentin  600 mg Oral TID  . metoprolol tartrate  12.5 mg Oral BID  . multivitamin with minerals  1 tablet Oral Daily  . pantoprazole  40 mg Oral Daily  . pneumococcal 23 valent vaccine  0.5 mL Intramuscular Tomorrow-1000  . risperiDONE  1 mg Oral BID  . [DISCONTINUED] enoxaparin (LOVENOX) injection  40 mg Subcutaneous Q24H  . [DISCONTINUED] phenytoin  100 mg Oral Q8H    Conditions of Recording:  This is a 16 channel EEG carried out with the patient in the awake state.  Description:  The waking background activity consists of a low voltage, symmetrical, fairly well organized, 10 Hz alpha activity, seen from the parieto-occipital and posterior temporal regions.  Low voltage fast activity, poorly organized, is seen anteriorly and is at times superimposed on more posterior regions.  A mixture of theta and alpha rhythms are seen from the central and temporal regions. The patient does not drowse or sleep. Hyperventilation was not performed.  Intermittent photic stimulation was performed but failed to illicit any change in the tracing.   The patient does have an episode of shaking during the recording but no epileptic correlate was noted.  Associated artifact was noted.  IMPRESSION: This is a normal EEG.  An episode of shaking was noted during the tracing but no epileptiform correlate was seen.     Thana Farr, MD [facility] [phone] 02/22/2012, 10:59  PM

## 2012-02-22 NOTE — Progress Notes (Signed)
Donnamarie Poag NP and ICU RN to room. Patient able to smile without droop, able to stick out tongue straight and shift it side to side without difficulty. No further orders. Will continue to monitor. Ginny Forth

## 2012-02-22 NOTE — Progress Notes (Signed)
Called to room by sitter, found patient in bed head shaking, legs drew up, arms limp, eyes closed, when neuro check done each eye was deviated toward nose, pupil reactive. Dr. Isidoro Donning notified.

## 2012-02-22 NOTE — Progress Notes (Signed)
Seizure activity occuring during EEG, head and body shaking, amonia ampule snapped under nose-brief pulling away  noted, respondes quickly to painful stimuli provided by Felicie Morn PA.

## 2012-02-22 NOTE — Significant Event (Signed)
Shift event:  RN paged this NP 2/2 to pt having acute left facial droop with rest of neuro exam intact. NP to bedside. S: Pt says she is not doing well b/c of "these seizures". Otherwise, she feels fine and denies any pain. O: VSS/reviewed. Pt is alert and normally responsive in NAD. She appears well. Card: RRR. Resp effort is normal. She does have a left lower lip droop which comes and goes during exam. When asked to smile big, her mouth and face are symmetrical. Her speech is clear, fluent, and appropriate. Pupils equal and reactive to light. Tongue is midline with normal strength. MOE x 4 with normal strength and tone. Grips 5/5 and equal.  A/P:  1. Change in neuro exam-nothing on PE to support this. As stated before, her facial droop comes and goes during exam and is really just her lip that is involved. When conversing, this goes away. Rest of neuro exam intact. No focal abnormalities noted. ? Secondary gain 2. Seizure disorder-thought to be pseudoseizures.  Maren Reamer, NP [facility]

## 2012-02-22 NOTE — Consult Note (Signed)
NEURO HOSPITALIST CONSULT NOTE    Reason for Consult: Seizure/perioods of confusion.   HPI:                                                                                                                                          Ruth Bell is an 51 y.o. female who has past medical history including seizure, migraine, depression and medication noncompliance. Patient was brought to ED secondary to seizure activity. At home she is on neurontin 600 mg TID, Dilantin 100 mg TID, Resperidone 1mg  QHS. On arrival to ED Dilantin level was <2.5. Patient was administered load of Dilantin 100 mg with  100 mg TID to follow. While hospitalized patient has had 2 further episodes of generalized shaking. Patient tells me she has been having on and off confusional states for about three weeks in addition to burning tingling sensations from her feet to her hands bilaterally. She states she has been taking all her medications but has not refilled her Dilantin for about 2 months.  Ruth Bell further questioned she tells me she has not taken the Dilantin for about 1-2 weeks.   Patient is very labile during exam. Per sitter in room, she had episode of bilateral arm shaking, lower lip sucking and decreased responsiveness this AM at At this time she is alert, oriented and bale to follow all my commands.   Patient was recently seen on 06/06/11--at that time conversation was had with PCP and outpatient neurologist. Patient has a known diagnosis of non-epileptic seizures. EEG final at that date showed no EEG correlate to episode captured on tracing.  Addendum: While patient was obtaining EEG I witness a period of bilateral UE arms flexed held to chest with fine jerking movements, eyes held tightly closed, legs relaxed.  Nurse held smellingsalt under nose and patient turned away.  When noxious stimuli was administered to feet, patient quickly withdrew both legs and started to cry, jerking stopped abruptly, she then  curled up in a ball and continued to cry without further jerking.   Past Medical History  Diagnosis Date  . Seizures     history of pseudoseizure disorder  . Migraine, unspecified, without mention of intractable migraine without mention of status migrainosus   . Anemia   . Menometrorrhagia 2006  . Uterine leiomyoma 2006  . Depression   . Hypertension   . Tubular adenoma of colon 05/2011    Past Surgical History  Procedure Date  . Appendectomy   . Bilateral salpingoophorectomy 04/06/04  . Abdominal hysterectomy 04/06/04  . Rotator cuff repair     Family History  Problem Relation Age of Onset  . Diabetes Other   . Hypertension Other   . Heart disease Mother   . Colon cancer Father  Social History:  reports that she has been smoking Cigarettes.  She has a 20 pack-year smoking history. She has never used smokeless tobacco. She reports that she does not drink alcohol or use illicit drugs.  No Known Allergies  MEDICATIONS:                                                                                                                     Prior to Admission:  Prescriptions prior to admission  Medication Sig Dispense Refill  . albuterol (PROVENTIL HFA;VENTOLIN HFA) 108 (90 BASE) MCG/ACT inhaler Inhale 2 puffs into the lungs every 6 (six) hours as needed for wheezing.  1 Inhaler  2  . citalopram (CELEXA) 40 MG tablet Take 40 mg by mouth every morning.       . gabapentin (NEURONTIN) 600 MG tablet Take 600 mg by mouth 3 (three) times daily.       . metroNIDAZOLE (METROGEL) 0.75 % gel Apply 1 application topically 2 (two) times daily.      . Multiple Vitamin (MULITIVITAMIN WITH MINERALS) TABS Take 1 tablet by mouth daily.      Marland Kitchen omeprazole (PRILOSEC) 20 MG capsule Take 20 mg by mouth at bedtime.      . phenytoin (DILANTIN) 100 MG ER capsule Take 1 capsule (100 mg total) by mouth 3 (three) times daily.  90 capsule  1  . promethazine (PHENERGAN) 25 MG tablet Take 25 mg by mouth every  6 (six) hours as needed. For nausea.      . risperiDONE (RISPERDAL M-TABS) 1 MG disintegrating tablet Take 1 mg by mouth 2 (two) times daily.      . Tapentadol HCl (NUCYNTA) 75 MG TABS Take 1 tablet by mouth 2 (two) times daily as needed. For pain.      Marland Kitchen risperiDONE (RISPERDAL M-TABS) 1 MG disintegrating tablet Take 1 tablet (1 mg total) by mouth at bedtime.  30 tablet  0   Scheduled:   . acetaminophen  650 mg Oral Once  . citalopram  40 mg Oral q morning - 10a  . enoxaparin (LOVENOX) injection  30 mg Subcutaneous Q24H  . gabapentin  600 mg Oral TID  . [COMPLETED] LORazepam  0.5 mg Intravenous Once  . [COMPLETED] LORazepam  1 mg Intravenous Once  . [COMPLETED] LORazepam  1 mg Intravenous Once  . multivitamin with minerals  1 tablet Oral Daily  . pantoprazole  40 mg Oral Daily  . [COMPLETED] phenytoin (DILANTIN) IV  1,000 mg Intravenous Once  . phenytoin  100 mg Oral Q8H  . risperiDONE  1 mg Oral BID  . [DISCONTINUED] enoxaparin (LOVENOX) injection  40 mg Subcutaneous Q24H     ROS:  History obtained from the patient  General ROS: negative for - chills, fatigue, fever, night sweats, weight gain or weight loss Psychological ROS: positive for - periods of confusion Ophthalmic ROS: negative for - blurry vision, double vision, eye pain or loss of vision ENT ROS: negative for - epistaxis, nasal discharge, oral lesions, sore throat, tinnitus or vertigo Allergy and Immunology ROS: negative for - hives or itchy/watery eyes Hematological and Lymphatic ROS: negative for - bleeding problems, bruising or swollen lymph nodes Endocrine ROS: negative for - galactorrhea, hair pattern changes, polydipsia/polyuria or temperature intolerance Respiratory ROS: negative for - cough, hemoptysis, shortness of breath or wheezing Cardiovascular ROS: negative for - chest pain,  dyspnea on exertion, edema or irregular heartbeat Gastrointestinal ROS: negative for - abdominal pain, diarrhea, hematemesis, nausea/vomiting or stool incontinence Genito-Urinary ROS: negative for - dysuria, hematuria, incontinence or urinary frequency/urgency Musculoskeletal ROS: negative for - joint swelling or muscular weakness Neurological ROS: as noted in HPI Dermatological ROS: negative for rash and skin lesion changes   Blood pressure 144/87, pulse 102, temperature 97.5 F (36.4 C), temperature source Oral, resp. rate 20, height 5\' 2"  (1.575 m), weight 44.77 kg (98 lb 11.2 oz), SpO2 100.00%.   Neurologic Examination:                                                                                                      Mental Status: Alert, oriented, thought content appropriate.  Speech fluent without evidence of aphasia.  Able to follow 3 step commands without difficulty. Cranial Nerves: II: Discs flat bilaterally; Visual fields grossly normal, pupils equal, round, reactive to light and accommodation III,IV, VI: ptosis not present, extra-ocular motions intact bilaterally V,VII: smile symmetric, facial light touch sensation normal bilaterally VIII: hearing normal bilaterally IX,X: gag reflex present XI: bilateral shoulder shrug XII: midline tongue extension Motor: Right : Upper extremity   5/5    Left:     Upper extremity   5/5  Lower extremity   5/5     Lower extremity   5/5 --When moving arms she has jerky movements bilateral arms throughout movements but no tremor when held outstretched.  Tone and bulk:normal tone throughout; no atrophy noted Sensory: Pinprick and light touch intact throughout, bilaterally Deep Tendon Reflexes: 2+ and symmetric throughout Plantars: Right: downgoing   Left: downgoing Cerebellar: normal finger-to-nose (bilateral jerky movements when asked to participate in movement.  Present throughout movement.  Jerking movents were less present when patient  was distracted but did not go fully away.  Left was much more pronounced than right.)   No tremor at rest. normal heel-to-shin test CV: pulses palpable throughout     No results found for this basename: cbc, bmp, coags, chol, tri, ldl, hga1c    Results for orders placed during the hospital encounter of 02/21/12 (from the past 48 hour(s))  CBC     Status: Normal   Collection Time   02/21/12 10:10 AM      Component Value Range Comment   WBC 7.2  4.0 - 10.5 K/uL    RBC 4.54  3.87 -  5.11 MIL/uL    Hemoglobin 12.7  12.0 - 15.0 g/dL    HCT 16.1  09.6 - 04.5 %    MCV 80.8  78.0 - 100.0 fL    MCH 28.0  26.0 - 34.0 pg    MCHC 34.6  30.0 - 36.0 g/dL    RDW 40.9  81.1 - 91.4 %    Platelets 278  150 - 400 K/uL   BASIC METABOLIC PANEL     Status: Abnormal   Collection Time   02/21/12 10:10 AM      Component Value Range Comment   Sodium 138  135 - 145 mEq/L    Potassium 3.4 (*) 3.5 - 5.1 mEq/L    Chloride 103  96 - 112 mEq/L    CO2 24  19 - 32 mEq/L    Glucose, Bld 118 (*) 70 - 99 mg/dL    BUN 7  6 - 23 mg/dL    Creatinine, Ser 7.82  0.50 - 1.10 mg/dL    Calcium 9.0  8.4 - 95.6 mg/dL    GFR calc non Af Amer >90  >90 mL/min    GFR calc Af Amer >90  >90 mL/min   PHENYTOIN LEVEL, TOTAL     Status: Abnormal   Collection Time   02/21/12 10:10 AM      Component Value Range Comment   Phenytoin Lvl <2.5 (*) 10.0 - 20.0 ug/mL   URINALYSIS, ROUTINE W REFLEX MICROSCOPIC     Status: Abnormal   Collection Time   02/21/12 10:46 AM      Component Value Range Comment   Color, Urine YELLOW  YELLOW    APPearance CLEAR  CLEAR    Specific Gravity, Urine 1.015  1.005 - 1.030    pH 6.0  5.0 - 8.0    Glucose, UA NEGATIVE  NEGATIVE mg/dL    Hgb urine dipstick MODERATE (*) NEGATIVE    Bilirubin Urine NEGATIVE  NEGATIVE    Ketones, ur NEGATIVE  NEGATIVE mg/dL    Protein, ur NEGATIVE  NEGATIVE mg/dL    Urobilinogen, UA 0.2  0.0 - 1.0 mg/dL    Nitrite NEGATIVE  NEGATIVE    Leukocytes, UA NEGATIVE  NEGATIVE    URINE MICROSCOPIC-ADD ON     Status: Abnormal   Collection Time   02/21/12 10:46 AM      Component Value Range Comment   Squamous Epithelial / LPF FEW (*) RARE    RBC / HPF 0-2  <3 RBC/hpf    Bacteria, UA FEW (*) RARE   CK     Status: Normal   Collection Time   02/21/12  5:19 PM      Component Value Range Comment   Total CK 68  7 - 177 U/L   PROLACTIN     Status: Normal   Collection Time   02/21/12  6:10 PM      Component Value Range Comment   Prolactin 9.7     BASIC METABOLIC PANEL     Status: Normal   Collection Time   02/22/12  4:54 AM      Component Value Range Comment   Sodium 142  135 - 145 mEq/L    Potassium 3.5  3.5 - 5.1 mEq/L    Chloride 105  96 - 112 mEq/L    CO2 27  19 - 32 mEq/L    Glucose, Bld 90  70 - 99 mg/dL    BUN 6  6 - 23 mg/dL  Creatinine, Ser 0.62  0.50 - 1.10 mg/dL    Calcium 8.8  8.4 - 16.1 mg/dL    GFR calc non Af Amer >90  >90 mL/min    GFR calc Af Amer >90  >90 mL/min     EEG--no evidence of seizure activity despite episode of shaking during recording  Felicie Morn PA-C [facility] [phone]  02/22/2012, 9:14 AM    Patient seen and examined.  Clinical course and management discussed.  Necessary edits performed.  I agree with the above.  Assessment and plan of care developed and discussed below.    Assessment: 51 year old female with episodes of shaking and unresponsiveness.  Patient with documented pseudoseizures in the past.  CK normal.  Prolactin normal as well.  Had EEG today which captured an event but no epileptiform correlate noted-again substantiating the fact that these are not epileptic in origin.    Plan: 1. Psych to evaluate patient 2. Would limit benzodiazepine use.   3. Dilantin level supratherapeutic.  Patient clearly not compliant with Dilantin at home.  Would hold and restart at 200mg  a day once therapeutic.     Thana Farr, MD [facility] [phone]  02/22/2012  7:20 PM

## 2012-02-22 NOTE — Progress Notes (Signed)
Offsite EEG completed. Pt experienced episode of shaking during EEG; Felicie Morn observed episode, RN came to bedside to assist pt. Pt recovered by end of EEG.

## 2012-02-22 NOTE — Progress Notes (Addendum)
Called to room by sitter,found patient in bed head shaking,mouth open, arms limp, eyes tightly closed.Dr Isidoro Donning notified.Per sitter episode started when godson was in room.

## 2012-02-22 NOTE — Progress Notes (Signed)
Patient ID: Ruth Bell  female  [id number]:[id number]    DOB: 1960-09-11    DOA: 02/21/2012  PCP: Lemont Fillers., NP  Assessment/Plan: Principal Problem:  *Seizure disorder versus pseudoseizures: Patient had multiple episodes of recurrent seizure like activity on the floor today. Patient reports that she had been out of Dilantin for more than a week prior to admission - EEG done, patient was given Dilantin load, cont oral 100mg  TID (?dose or add another agent) - Awaiting neurology consultation, EEG done today - patient will probably benefit from psychiatry consultation given her labile mood, ?pseudoseizures, multiple psych meds, I have called psych for rec's  Active Problems:  Anxiety and depression: cont psych meds   HTN (hypertension): added metoprolol   Non compliance w medication regimen - called psych consult  DVT Prophylaxis: Lovenox  Code Status: Full code  Disposition: Await neurology and psychiatry recommendations    Subjective: Multiple episodes of seizure-like activity today. At the time of my examination patient is alert and oriented, no complaints,   Objective: Weight change:   Intake/Output Summary (Last 24 hours) at 02/22/12 1458 Last data filed at 02/22/12 1354  Gross per 24 hour  Intake    480 ml  Output    400 ml  Net     80 ml   Blood pressure 151/81, pulse 94, temperature 98.6 F (37 C), temperature source Oral, resp. rate 18, height 5\' 2"  (1.575 m), weight 44.77 kg (98 lb 11.2 oz), SpO2 98.00%.  Physical Exam: General: Alert and awake, oriented x3, not in any acute distress. HEENT: anicteric sclera, pupils reactive to light and accommodation, EOMI CVS: S1-S2 clear, no murmur rubs or gallops Chest: clear to auscultation bilaterally, no wheezing, rales or rhonchi Abdomen: soft nontender, nondistended, normal bowel sounds, no organomegaly Extremities: no cyanosis, clubbing or edema noted bilaterally Neuro: Cranial nerves II-XII intact, no focal  neurological deficits  Lab Results: Basic Metabolic Panel:  Lab 02/22/12 5784 02/21/12 1010  NA 142 138  K 3.5 3.4*  CL 105 103  CO2 27 24  GLUCOSE 90 118*  BUN 6 7  CREATININE 0.62 0.50  CALCIUM 8.8 9.0  MG -- --  PHOS -- --   Liver Function Tests:  Lab 02/22/12 1145  AST --  ALT --  ALKPHOS --  BILITOT --  PROT --  ALBUMIN 3.4*   No results found for this basename: LIPASE:2,AMYLASE:2 in the last 168 hours No results found for this basename: AMMONIA:2 in the last 168 hours CBC:  Lab 02/21/12 1010  WBC 7.2  NEUTROABS --  HGB 12.7  HCT 36.7  MCV 80.8  PLT 278   Cardiac Enzymes:  Lab 02/21/12 1719  CKTOTAL 68  CKMB --  CKMBINDEX --  TROPONINI --   BNP: No components found with this basename: POCBNP:2 CBG: No results found for this basename: GLUCAP:5 in the last 168 hours   Micro Results: No results found for this or any previous visit (from the past 240 hour(s)).  Studies/Results: No results found.  Medications: Scheduled Meds:   . acetaminophen  650 mg Oral Once  . [COMPLETED] ammonia      . citalopram  40 mg Oral q morning - 10a  . enoxaparin (LOVENOX) injection  30 mg Subcutaneous Q24H  . gabapentin  600 mg Oral TID  . multivitamin with minerals  1 tablet Oral Daily  . pantoprazole  40 mg Oral Daily  . phenytoin  100 mg Oral Q8H  . pneumococcal 23 valent vaccine  0.5 mL Intramuscular Tomorrow-1000  . risperiDONE  1 mg Oral BID  . [DISCONTINUED] enoxaparin (LOVENOX) injection  40 mg Subcutaneous Q24H      LOS: 1 day   RAI,RIPUDEEP M.D. [facility] 02/22/2012, 2:58 PM Pager: [phone]  If 7PM-7AM, please contact night-coverage [email or web address] Password [email or web address]

## 2012-02-23 DIAGNOSIS — G40909 Epilepsy, unspecified, not intractable, without status epilepticus: Secondary | ICD-10-CM

## 2012-02-23 DIAGNOSIS — F329 Major depressive disorder, single episode, unspecified: Secondary | ICD-10-CM

## 2012-02-23 MED ORDER — METOPROLOL TARTRATE 25 MG PO TABS
25.0000 mg | ORAL_TABLET | Freq: Two times a day (BID) | ORAL | Status: DC
  Administered 2012-02-23 – 2012-02-25 (×4): 25 mg via ORAL
  Filled 2012-02-23 (×5): qty 1

## 2012-02-23 NOTE — Progress Notes (Signed)
Patient ID: Ruth Bell  female  YNW:[id number]    DOB: [birth year]    DOA: 02/21/2012  PCP: Lemont Fillers., NP  Assessment/Plan: Principal Problem:  *Seizure disorder versus pseudoseizures: Patient had multiple episodes of recurrent seizure like activity on the floor yesterday and one today morning. Patient reported that she had been out of Dilantin for more than a week prior to admission.  - EEG done and negative for epileptiform activity, patient was given Dilantin load, Dilantin level now high, held dose until therapeutic - Upon explaining the patient today about pseudoseizures, she states that her son is most of the time able to talk her out of the seizures but this seizure-like activity is making her overwhelmed and her grandchildren get scared when it happens to her at home. She feels afraid to sleep (feels her heart may stop if she has 'seizures' during sleep).  - patient will benefit from psychiatry consultation given her labile mood, ?pseudoseizures, multiple psych meds, sitter in the room for depression and suicidal ideation. She reports that she wants it 'all to go away'.  Active Problems:  Anxiety and depression: cont psych meds, cont 1:1 sitter   HTN (hypertension): cont metoprolol, increased dose   Non compliance w medication regimen - called psych consult  DVT Prophylaxis: Lovenox  Code Status: Full code  Disposition: Awaiting psychiatry recommendations, psych consulted   Subjective: 1-1 sitter in the room, patient appears depressed. Again episode of head shaking and whole body shaking with eyes closed this morning. Patient paranoid about the sitter at the time.  Objective: Weight change:   Intake/Output Summary (Last 24 hours) at 02/23/12 1207 Last data filed at 02/23/12 0900  Gross per 24 hour  Intake    840 ml  Output    400 ml  Net    440 ml   Blood pressure 167/94, pulse 77, temperature 98.6 F (37 C), temperature source Oral, resp. rate 18, height 5'  2" (1.575 m), weight 44.77 kg (98 lb 11.2 oz), SpO2 100.00%.  Physical Exam: General: Alert and awake, oriented x3, NAD, depressed HEENT: anicteric sclera, pupils reactive to light and accommodation, EOMI CVS: S1-S2 clear, no murmur rubs or gallops Chest: CTAB Abdomen: soft NT, ND, NBS Extremities: no cyanosis, clubbing or edema noted bilaterally Neuro: Cranial nerves II-XII intact, no focal neurological deficits  Lab Results: Basic Metabolic Panel:  Lab 02/22/12 6578 02/21/12 1010  NA 142 138  K 3.5 3.4*  CL 105 103  CO2 27 24  GLUCOSE 90 118*  BUN 6 7  CREATININE 0.62 0.50  CALCIUM 8.8 9.0  MG -- --  PHOS -- --   Liver Function Tests:  Lab 02/22/12 1145  AST --  ALT --  ALKPHOS --  BILITOT --  PROT --  ALBUMIN 3.4*   No results found for this basename: LIPASE:2,AMYLASE:2 in the last 168 hours No results found for this basename: AMMONIA:2 in the last 168 hours CBC:  Lab 02/21/12 1010  WBC 7.2  NEUTROABS --  HGB 12.7  HCT 36.7  MCV 80.8  PLT 278   Cardiac Enzymes:  Lab 02/21/12 1719  CKTOTAL 68  CKMB --  CKMBINDEX --  TROPONINI --    Studies/Results: No results found.  Medications: Scheduled Meds:    . acetaminophen  650 mg Oral Once  . citalopram  40 mg Oral q morning - 10a  . enoxaparin (LOVENOX) injection  30 mg Subcutaneous Q24H  . gabapentin  600 mg Oral TID  . metoprolol  tartrate  12.5 mg Oral BID  . multivitamin with minerals  1 tablet Oral Daily  . pantoprazole  40 mg Oral Daily  . [COMPLETED] pneumococcal 23 valent vaccine  0.5 mL Intramuscular Tomorrow-1000  . risperiDONE  1 mg Oral BID  . [DISCONTINUED] phenytoin  100 mg Oral Q8H      LOS: 2 days   Anetta Olvera M.D. [facility] 02/23/2012, 12:07 PM Pager: [phone]  If 7PM-7AM, please contact night-coverage [email or web address] Password [email or web address]

## 2012-02-23 NOTE — Progress Notes (Signed)
Episode of head shaking,rt arm shaking, bilateral leg shaking noted, eyes closed lasting 2 min. Now relaxed, no response to verbal stimuli or ammonia capusule 98.2 75 171/94 O2 sat 100% ra NSR. Afterwards seems confused calling for mama, trying to get oob and is afraid of sitter. Repositioned in bed, crying, reassurance given. Encounter lasted 20 min.

## 2012-02-23 NOTE — Consult Note (Addendum)
Psychiatry Consult for Depression  Reason for Consult:  Seizures? Referring Physician:   [name] is an 51 y.o. female   Assessment:  AXIS I: Depressive d/o nos,   AXIS II: Deferred  AXIS III: see mdical hx ? ?  ? ?  ?  ? ?  ?  ?  ?  ?  AXIS IV: problems related to social environment, problems with access to health care services and problems with primary support group  AXIS V: 50  ?  Treatment Plan/Recommendations:  1.will recommend to consider Celexa 10 mg QAM to target depressive symptoms. Also recomend TSH  2 Will recommend psy out pt follow up. Her seizures may have precipatated by stress  3. Will continue to follow    Subjective:   HPI:    28 yeas old female with Past oral history of seizure disorder, history of noncompliance, hypertension who reported as not been taking her Dilantin for quite some time and in was brought in after she started having seizures today.   Reportedly she has seizure and after it became concerned and called primary care physician advised her to call 911 the patient was transported here. The emergency room, she was evaluated with normal blood work and a normal CT scan of the head. Dilantin level however was practically nonexistent. She continued to have several seizures in the emergency room which were witnessed and she was given Ativan for this.per patient's husband who took the patient is or noncompliant with all of her medications because she does not like her to make her feel.-Very drowsy.  she had a generalized seizure in before admission.   Seen today. Continue to have seizure at this time. Feels stressed out due to not finding a job these days and thinks she feels sad form some time. Feels down with low energy. Poor sleep at times, feels her memory is not good for some times now. She also multiple deaths in family in recent past. Denies any manic symptoms or AVH.   Past Psychiatric History: Hx of depression  Past Medical  History  Diagnosis Date  . Seizures     history of pseudoseizure disorder  . Migraine, unspecified, without mention of intractable migraine without mention of status migrainosus   . Anemia   . Menometrorrhagia 2006  . Uterine leiomyoma 2006  . Depression   . Hypertension   . Tubular adenoma of colon 05/2011   Past Surgical History  Procedure Date  . Appendectomy   . Bilateral salpingoophorectomy 04/06/04  . Abdominal hysterectomy 04/06/04  . Rotator cuff repair     reports that she has been smoking Cigarettes.  She has a 20 pack-year smoking history. She has never used smokeless tobacco. She reports that she does not drink alcohol or use illicit drugs. Family History  Problem Relation Age of Onset  . Diabetes Other   . Hypertension Other   . Heart disease Mother   . Colon cancer Father    Allergies:  No Known Allergies Social; lives with husband  Objective: Blood pressure 109/70, pulse 81, temperature 98.7 F (37.1 C), temperature source Oral, resp. rate 16, height 5\' 2"  (1.575 m), weight 44.77 kg (98 lb 11.2 oz), SpO2 99.00%.Body mass index is 18.05 kg/(m^2). Results for orders placed during the hospital encounter of 02/21/12 (from the past 72 hour(s))  CBC     Status: Normal   Collection Time   02/21/12 10:10 AM      Component Value Range Comment   WBC  7.2  4.0 - 10.5 K/uL    RBC 4.54  3.87 - 5.11 MIL/uL    Hemoglobin 12.7  12.0 - 15.0 g/dL    HCT 40.1  02.7 - 25.3 %    MCV 80.8  78.0 - 100.0 fL    MCH 28.0  26.0 - 34.0 pg    MCHC 34.6  30.0 - 36.0 g/dL    RDW 66.4  40.3 - 47.4 %    Platelets 278  150 - 400 K/uL   BASIC METABOLIC PANEL     Status: Abnormal   Collection Time   02/21/12 10:10 AM      Component Value Range Comment   Sodium 138  135 - 145 mEq/L    Potassium 3.4 (*) 3.5 - 5.1 mEq/L    Chloride 103  96 - 112 mEq/L    CO2 24  19 - 32 mEq/L    Glucose, Bld 118 (*) 70 - 99 mg/dL    BUN 7  6 - 23 mg/dL    Creatinine, Ser 2.59  0.50 - 1.10 mg/dL    Calcium  9.0  8.4 - 10.5 mg/dL    GFR calc non Af Amer >90  >90 mL/min    GFR calc Af Amer >90  >90 mL/min   PHENYTOIN LEVEL, TOTAL     Status: Abnormal   Collection Time   02/21/12 10:10 AM      Component Value Range Comment   Phenytoin Lvl <2.5 (*) 10.0 - 20.0 ug/mL   URINALYSIS, ROUTINE W REFLEX MICROSCOPIC     Status: Abnormal   Collection Time   02/21/12 10:46 AM      Component Value Range Comment   Color, Urine YELLOW  YELLOW    APPearance CLEAR  CLEAR    Specific Gravity, Urine 1.015  1.005 - 1.030    pH 6.0  5.0 - 8.0    Glucose, UA NEGATIVE  NEGATIVE mg/dL    Hgb urine dipstick MODERATE (*) NEGATIVE    Bilirubin Urine NEGATIVE  NEGATIVE    Ketones, ur NEGATIVE  NEGATIVE mg/dL    Protein, ur NEGATIVE  NEGATIVE mg/dL    Urobilinogen, UA 0.2  0.0 - 1.0 mg/dL    Nitrite NEGATIVE  NEGATIVE    Leukocytes, UA NEGATIVE  NEGATIVE   URINE MICROSCOPIC-ADD ON     Status: Abnormal   Collection Time   02/21/12 10:46 AM      Component Value Range Comment   Squamous Epithelial / LPF FEW (*) RARE    RBC / HPF 0-2  <3 RBC/hpf    Bacteria, UA FEW (*) RARE   CK     Status: Normal   Collection Time   02/21/12  5:19 PM      Component Value Range Comment   Total CK 68  7 - 177 U/L   PROLACTIN     Status: Normal   Collection Time   02/21/12  6:10 PM      Component Value Range Comment   Prolactin 9.7     BASIC METABOLIC PANEL     Status: Normal   Collection Time   02/22/12  4:54 AM      Component Value Range Comment   Sodium 142  135 - 145 mEq/L    Potassium 3.5  3.5 - 5.1 mEq/L    Chloride 105  96 - 112 mEq/L    CO2 27  19 - 32 mEq/L    Glucose, Bld 90  70 -  99 mg/dL    BUN 6  6 - 23 mg/dL    Creatinine, Ser 3.08  0.50 - 1.10 mg/dL    Calcium 8.8  8.4 - 65.7 mg/dL    GFR calc non Af Amer >90  >90 mL/min    GFR calc Af Amer >90  >90 mL/min   ALBUMIN     Status: Abnormal   Collection Time   02/22/12 11:45 AM      Component Value Range Comment   Albumin 3.4 (*) 3.5 - 5.2 g/dL   PHENYTOIN  LEVEL, TOTAL     Status: Abnormal   Collection Time   02/22/12 11:45 AM      Component Value Range Comment   Phenytoin Lvl 26.1 (*) 10.0 - 20.0 ug/mL    Labs are reviewed and are pertinent for this pt.  Current Facility-Administered Medications  Medication Dose Route Frequency Provider Last Rate Last Dose  . acetaminophen (TYLENOL) tablet 650 mg  650 mg Oral Once Tobin Chad, MD      . acetaminophen (TYLENOL) tablet 650 mg  650 mg Oral Q6H PRN Caroline More, NP   650 mg at 02/22/12 1939  . albuterol (PROVENTIL HFA;VENTOLIN HFA) 108 (90 BASE) MCG/ACT inhaler 2 puff  2 puff Inhalation Q6H PRN Hollice Espy, MD      . citalopram (CELEXA) tablet 40 mg  40 mg Oral q morning - 10a Hollice Espy, MD   40 mg at 02/23/12 0949  . enoxaparin (LOVENOX) injection 30 mg  30 mg Subcutaneous Q24H Berkley Harvey, [place]   30 mg at 02/22/12 1938  . gabapentin (NEURONTIN) capsule 600 mg  600 mg Oral TID Hollice Espy, MD   600 mg at 02/23/12 1556  . LORazepam (ATIVAN) injection 0.25 mg  0.25 mg Intravenous Q4H PRN Caroline More, NP      . metoprolol tartrate (LOPRESSOR) tablet 25 mg  25 mg Oral BID Ripudeep K Rai, MD      . multivitamin with minerals tablet 1 tablet  1 tablet Oral Daily Hollice Espy, MD   1 tablet at 02/23/12 0949  . ondansetron (ZOFRAN) tablet 4 mg  4 mg Oral Q6H PRN Hollice Espy, MD       Or  . ondansetron (ZOFRAN) injection 4 mg  4 mg Intravenous Q6H PRN Hollice Espy, MD      . pantoprazole (PROTONIX) EC tablet 40 mg  40 mg Oral Daily Hollice Espy, MD   40 mg at 02/23/12 0949  . [COMPLETED] pneumococcal 23 valent vaccine (PNU-IMMUNE) injection 0.5 mL  0.5 mL Intramuscular Tomorrow-1000 Ripudeep K Rai, MD   0.5 mL at 02/23/12 0949  . risperiDONE (RISPERDAL M-TABS) disintegrating tablet 1 mg  1 mg Oral BID Hollice Espy, MD   1 mg at 02/23/12 0949  . [DISCONTINUED] metoprolol tartrate (LOPRESSOR) tablet 12.5 mg  12.5 mg Oral BID Ripudeep Jenna Luo, MD    12.5 mg at 02/23/12 0949    Psychiatric Specialty Exam: Physical Exam  ROS  Blood pressure 109/70, pulse 81, temperature 98.7 F (37.1 C), temperature source Oral, resp. rate 16, height 5\' 2"  (1.575 m), weight 44.77 kg (98 lb 11.2 oz), SpO2 99.00%.Body mass index is 18.05 kg/(m^2).  Mental Status Examination/Evaluation:  Appearance: on bed  Eye Contact:: Good  Speech: normal  Volume: Normal  Mood: depressed  Affect: ristricted  Thought Process: organized  Orientation: Full  Thought Content: NO AVH  Suicidal Thoughts: No  Homicidal Thoughts: no  Memory: Recent; Poor  Judgement: Impaired  Insight: Lacking  Psychomotor Activity: Normal  Concentration: poor  Recall: Fair  Akathisia: No  Ruth Bell 02/23/2012 7:40 PM

## 2012-02-24 ENCOUNTER — Observation Stay (HOSPITAL_COMMUNITY): Payer: 59

## 2012-02-24 LAB — ALBUMIN: Albumin: 3.7 g/dL (ref 3.5–5.2)

## 2012-02-24 LAB — TSH: TSH: 0.797 u[IU]/mL (ref 0.350–4.500)

## 2012-02-24 LAB — PHENYTOIN LEVEL, TOTAL: Phenytoin Lvl: 16.6 ug/mL (ref 10.0–20.0)

## 2012-02-24 NOTE — Progress Notes (Signed)
Patient in bed sleeping no seizure activity noted after giving PRN ativan. Will continue to monitor.

## 2012-02-24 NOTE — Progress Notes (Signed)
At this time patient's daughter and daughter's husband were visiting and patient had an episode that lasted 3 minutes during which time patient shaked her head steadily, closed her eyes, tensed her legs and arms. Ammonia ampule snapped under nose and patient pulled away. At one point her eyes rolled back and she began to make deep throaty noises. Patient stopped and appeared to rest and would not respond to stimuli or voice, then began to have another episode lasting around 3 minutes which included her previous events and shaking her entire body with more throaty noises. After this event patient appeared confused and asked for her mother and cried. Her daughter was there to reassure her and then patient went to sleep. Entire encounter lasted 45 minutes to an hour. Vital signs taken. Patient woke up later in the shift was was very calm and cooperative yet needed a lot of emotional reassurance. Will continue to monitor.

## 2012-02-24 NOTE — Progress Notes (Addendum)
Patient ID: Ruth Bell  female  ZOX:[id number]    DOB: 28-Jun-1960    DOA: 02/21/2012  PCP: Lemont Fillers., NP  Assessment/Plan: Principal Problem:  *Seizure disorder versus pseudoseizures: Patient had multiple episodes of recurrent seizure like activity on the floor yesterday and one today morning. Patient reported that she had been out of Dilantin for more than a week prior to admission.  - EEG done and negative for epileptiform activity, - Corrected Dilantin level is 20, therapeutic, hold off on starting Dilantin today per neuro rec's, will recheck level tomorrow   - Upon explaining the patient today about pseudoseizures and potential discharge in the presence of her son, she she started having episode of head shaking and whole-body shaking but was conscious during the whole time, responding appropriately to me and her son for about a minute, then closed her eyes and did not respond, VS remained stable. I rechecked back on her and patient was alert and awake, talking appropriately with sitter. - ordered MRI of the head, CT head in 05/2011 was unremarkable. - Psych consult was called, patient was recommended celexa (however she is already on it), I have paged the psychiatrist multiple times again today to follow-up (awaiting response).  - Patient was ambulating in the hallway when I initially came to the floor. She does not want to go to any facility, [facility] and states that she is afraid of going home.     Active Problems:  Anxiety and depression: cont psych meds, cont 1:1 sitter   HTN (hypertension): stable now, cont BB   Non compliance w medication regimen - psych following  DVT Prophylaxis: Lovenox  Code Status: Full code  Disposition: unclear   Subjective: - another episode of head shaking and whole-body shaking lasting 2 mins when I was discussing about pseudoseizures and potential discharge with the patient in front of her son  Objective: Weight change:   Intake/Output  Summary (Last 24 hours) at 02/24/12 1212 Last data filed at 02/24/12 0600  Gross per 24 hour  Intake   1200 ml  Output      0 ml  Net   1200 ml   Blood pressure 138/88, pulse 95, temperature 98.6 F (37 C), temperature source Oral, resp. rate 18, height 5\' 2"  (1.575 m), weight 44.77 kg (98 lb 11.2 oz), SpO2 100.00%.  Physical Exam: General: Alert and awake, oriented (please see subjective) HEENT: anicteric sclera, pupils reactive to light and accommodation, EOMI CVS: S1-S2 clear, no murmur rubs or gallops Chest: CTAB Abdomen: soft NT, ND, NBS Extremities: no cyanosis, clubbing or edema noted bilaterally Neuro: Cranial nerves II-XII intact, no focal neurological deficits, sitting in chair, initially ambulating in hallway  Lab Results: Basic Metabolic Panel:  Lab 02/22/12 8119 02/21/12 1010  NA 142 138  K 3.5 3.4*  CL 105 103  CO2 27 24  GLUCOSE 90 118*  BUN 6 7  CREATININE 0.62 0.50  CALCIUM 8.8 9.0  MG -- --  PHOS -- --   Liver Function Tests:  Lab 02/24/12 0516 02/22/12 1145  AST -- --  ALT -- --  ALKPHOS -- --  BILITOT -- --  PROT -- --  ALBUMIN 3.7 3.4*   No results found for this basename: LIPASE:2,AMYLASE:2 in the last 168 hours No results found for this basename: AMMONIA:2 in the last 168 hours CBC:  Lab 02/21/12 1010  WBC 7.2  NEUTROABS --  HGB 12.7  HCT 36.7  MCV 80.8  PLT 278   Cardiac  Enzymes:  Lab 02/21/12 1719  CKTOTAL 68  CKMB --  CKMBINDEX --  TROPONINI --    Studies/Results: No results found.  Medications: Scheduled Meds:    . acetaminophen  650 mg Oral Once  . citalopram  40 mg Oral q morning - 10a  . enoxaparin (LOVENOX) injection  30 mg Subcutaneous Q24H  . gabapentin  600 mg Oral TID  . metoprolol tartrate  25 mg Oral BID  . multivitamin with minerals  1 tablet Oral Daily  . pantoprazole  40 mg Oral Daily  . risperiDONE  1 mg Oral BID  . [DISCONTINUED] metoprolol tartrate  12.5 mg Oral BID      LOS: 3 days    Jaeleah Smyser M.D. [facility] 02/24/2012, 12:12 PM Pager: [phone]  If 7PM-7AM, please contact night-coverage [email or web address] Password [email or web address]

## 2012-02-24 NOTE — Progress Notes (Signed)
Patient had  Fine head and right hand tremor while  Dr Isidoro Donning was in the room talking to patient about her discharge. Lasted about five minutes; Patient God son at the bedside trying to leave but patient kept calling him back to stay. Vitals 98.6. 138/88, 95, 18, 100%-RA. Will continue to assess patient.

## 2012-02-24 NOTE — Consult Note (Signed)
Psychiatry Consult for Depression  Reason for Consult:  Seizures? Referring Physician:   [name] is an 51 y.o. female   Assessment:  AXIS I: Depressive d/o nos,   AXIS II: Deferred  AXIS III: see mdical hx ? ?  ? ?  ?  ? ?  ?  ?  ?  ?  AXIS IV: problems related to social environment, problems with access to health care services and problems with primary support group  AXIS V: 50  ?  Treatment Plan/Recommendations:  1.will recommend to continue Celexa 40 mg QD to target depressive symptoms. Also recomend TSH  2. Abilify starting as 2 mg then increasing to 5 mg may be considerd instead for risperidone to help her mood and depression  3 Will recommend psy out pt follow up. Will recommend out pt care after organic work up including sending her records to her PCP and psychiatry after discharge  4. Will continue to follow as needed    Subjective:   HPI:    57 yeas old female with Past oral history of seizure disorder, history of noncompliance, hypertension who reported as not been taking her Dilantin for quite some time and in was brought in after she started having seizures today.    Interval Hx:   Seen today. Continue to have seizure (3) at this time. Feels stressed out today as could not do MRI as she gets anxious in spaces. Hoping to get better soon. Reports not caring about her family but denies any safety concerns at this time.   Past Psychiatric History: Hx of depression  Past Medical History  Diagnosis Date  . Seizures     history of pseudoseizure disorder  . Migraine, unspecified, without mention of intractable migraine without mention of status migrainosus   . Anemia   . Menometrorrhagia 2006  . Uterine leiomyoma 2006  . Depression   . Hypertension   . Tubular adenoma of colon 05/2011   Past Surgical History  Procedure Date  . Appendectomy   . Bilateral salpingoophorectomy 04/06/04  . Abdominal hysterectomy 04/06/04  . Rotator cuff  repair     reports that she has been smoking Cigarettes.  She has a 20 pack-year smoking history. She has never used smokeless tobacco. She reports that she does not drink alcohol or use illicit drugs. Family History  Problem Relation Age of Onset  . Diabetes Other   . Hypertension Other   . Heart disease Mother   . Colon cancer Father    Allergies:  No Known Allergies Social; lives with husband  Objective: Blood pressure 127/85, pulse 86, temperature 98.6 F (37 C), temperature source Oral, resp. rate 18, height 5\' 2"  (1.575 m), weight 44.77 kg (98 lb 11.2 oz), SpO2 97.00%.Body mass index is 18.05 kg/(m^2). Results for orders placed during the hospital encounter of 02/21/12 (from the past 72 hour(s))  PROLACTIN     Status: Normal   Collection Time   02/21/12  6:10 PM      Component Value Range Comment   Prolactin 9.7     BASIC METABOLIC PANEL     Status: Normal   Collection Time   02/22/12  4:54 AM      Component Value Range Comment   Sodium 142  135 - 145 mEq/L    Potassium 3.5  3.5 - 5.1 mEq/L    Chloride 105  96 - 112 mEq/L    CO2 27  19 - 32 mEq/L    Glucose, Bld  90  70 - 99 mg/dL    BUN 6  6 - 23 mg/dL    Creatinine, Ser 1.61  0.50 - 1.10 mg/dL    Calcium 8.8  8.4 - 09.6 mg/dL    GFR calc non Af Amer >90  >90 mL/min    GFR calc Af Amer >90  >90 mL/min   ALBUMIN     Status: Abnormal   Collection Time   02/22/12 11:45 AM      Component Value Range Comment   Albumin 3.4 (*) 3.5 - 5.2 g/dL   PHENYTOIN LEVEL, TOTAL     Status: Abnormal   Collection Time   02/22/12 11:45 AM      Component Value Range Comment   Phenytoin Lvl 26.1 (*) 10.0 - 20.0 ug/mL   PHENYTOIN LEVEL, TOTAL     Status: Normal   Collection Time   02/24/12  5:16 AM      Component Value Range Comment   Phenytoin Lvl 16.6  10.0 - 20.0 ug/mL   ALBUMIN     Status: Normal   Collection Time   02/24/12  5:16 AM      Component Value Range Comment   Albumin 3.7  3.5 - 5.2 g/dL    Labs are reviewed and are  pertinent for this pt.  Current Facility-Administered Medications  Medication Dose Route Frequency Provider Last Rate Last Dose  . acetaminophen (TYLENOL) tablet 650 mg  650 mg Oral Once Tobin Chad, MD      . acetaminophen (TYLENOL) tablet 650 mg  650 mg Oral Q6H PRN Caroline More, NP   650 mg at 02/23/12 2329  . albuterol (PROVENTIL HFA;VENTOLIN HFA) 108 (90 BASE) MCG/ACT inhaler 2 puff  2 puff Inhalation Q6H PRN Hollice Espy, MD      . citalopram (CELEXA) tablet 40 mg  40 mg Oral q morning - 10a Hollice Espy, MD   40 mg at 02/24/12 1011  . enoxaparin (LOVENOX) injection 30 mg  30 mg Subcutaneous Q24H Berkley Harvey, [place]   30 mg at 02/23/12 2323  . gabapentin (NEURONTIN) capsule 600 mg  600 mg Oral TID Hollice Espy, MD   600 mg at 02/24/12 1011  . LORazepam (ATIVAN) injection 0.25 mg  0.25 mg Intravenous Q4H PRN Caroline More, NP   0.25 mg at 02/24/12 1420  . metoprolol tartrate (LOPRESSOR) tablet 25 mg  25 mg Oral BID Ripudeep K Rai, MD   25 mg at 02/24/12 1011  . multivitamin with minerals tablet 1 tablet  1 tablet Oral Daily Hollice Espy, MD   1 tablet at 02/24/12 1011  . ondansetron (ZOFRAN) tablet 4 mg  4 mg Oral Q6H PRN Hollice Espy, MD       Or  . ondansetron (ZOFRAN) injection 4 mg  4 mg Intravenous Q6H PRN Hollice Espy, MD      . pantoprazole (PROTONIX) EC tablet 40 mg  40 mg Oral Daily Hollice Espy, MD   40 mg at 02/24/12 1011  . risperiDONE (RISPERDAL M-TABS) disintegrating tablet 1 mg  1 mg Oral BID Hollice Espy, MD   1 mg at 02/24/12 1011    Psychiatric Specialty Exam: Physical Exam   ROS   Blood pressure 127/85, pulse 86, temperature 98.6 F (37 C), temperature source Oral, resp. rate 18, height 5\' 2"  (1.575 m), weight 44.77 kg (98 lb 11.2 oz), SpO2 97.00%.Body mass index is 18.05 kg/(m^2).  Mental Status Examination/Evaluation:  Appearance: on bed  Eye  Contact:: Good  Speech: normal  Volume: Normal  Mood: not good  Affect: ristricted  Thought Process: organized  Orientation: Full  Thought Content: NO AVH  Suicidal Thoughts: No  Homicidal Thoughts: no  Memory: Recent; Poor  Judgement: Impaired  Insight: Lacking  Psychomotor Activity: Normal  Concentration: poor  Recall: Fair  Akathisia: No  Wonda Cerise 02/24/2012 5:34 PM

## 2012-02-24 NOTE — Progress Notes (Signed)
Patient went down for MRI . Later MRI tech called and stated patient is having seizure, was unresponsive for few seconds by the time I got down patient head and left hand shaking, is awake, speaking but speech slurred, by the times we got back to the room head still shaking with the right hand; asked for something to drink, PRN ativan given and Dr. Isidoro Donning notified of the episode and that MRI was not done/completed. No new order given, will continue to assess patient. The whole episode lasted about .

## 2012-02-24 NOTE — Progress Notes (Signed)
Brief Pharmacy Consult Note: Phenytoin Follow Up  51yo F admitted from home after 2-3 witnessed grand mal seizures 12/5. On phenytoin PTA but ran out of medication approximately 2 weeks ago. Dose reported as 100mg  po TID.   Given 1gm IV load on 12/5 ~2pm Last dose: 100mg  po on 12/6 ~2pm, placed on hold Level on 12/6 = 26.1, corrects to 33 with albumin 3.4 Level today = 16.6, corrects to 19 with albumin 3.7  Per Neuro recommendations, plan to resume 200mg  po daily once level is therapeutic (10-20 mcg/ml). MD has ordered level and albumin for tomorrow morning.  Nothing to add to dosing at this point, therefore, Pharmacy will sign-off. Please call if questions.  Loralee Pacas, PharmD, BCPS Pharmacy: [phone]

## 2012-02-24 NOTE — Progress Notes (Signed)
Patient displaying fine tremor this morning, general in location. Patient stated that she was afraid she was going to be sent home today. She stated that she did not want to go back home because she did not want to be alone and did not want to go to a "nursing home" either. Patient reassured. Day shift RN aware.

## 2012-02-25 LAB — BASIC METABOLIC PANEL
BUN: 13 mg/dL (ref 6–23)
CO2: 30 mEq/L (ref 19–32)
Calcium: 9.8 mg/dL (ref 8.4–10.5)
Chloride: 100 mEq/L (ref 96–112)
Creatinine, Ser: 0.6 mg/dL (ref 0.50–1.10)
GFR calc Af Amer: >90 mL/min (ref >90)
GFR calc non Af Amer: >90 mL/min (ref >90)
Glucose, Bld: 88 mg/dL (ref 70–99)
Potassium: 4.4 mEq/L (ref 3.5–5.1)
Sodium: 138 mEq/L (ref 135–145)

## 2012-02-25 LAB — PHENYTOIN LEVEL, TOTAL: Phenytoin Lvl: 13.2 ug/mL (ref 10.0–20.0)

## 2012-02-25 LAB — ALBUMIN: Albumin: 3.7 g/dL (ref 3.5–5.2)

## 2012-02-25 MED ORDER — ARIPIPRAZOLE 2 MG PO TABS: 2.0000 mg | ORAL_TABLET | Freq: Every day | ORAL | Status: DC

## 2012-02-25 MED ORDER — PHENYTOIN 50 MG PO CHEW
200.0000 mg | CHEWABLE_TABLET | Freq: Every day | ORAL | Status: DC
  Administered 2012-02-25: 200 mg via ORAL
  Filled 2012-02-25 (×2): qty 4

## 2012-02-25 MED ORDER — METOPROLOL TARTRATE 25 MG PO TABS: 25.0000 mg | ORAL_TABLET | Freq: Two times a day (BID) | ORAL | Status: DC

## 2012-02-25 MED ORDER — ARIPIPRAZOLE 2 MG PO TABS
2.0000 mg | ORAL_TABLET | Freq: Every day | ORAL | Status: DC
  Administered 2012-02-25: 2 mg via ORAL
  Filled 2012-02-25: qty 1

## 2012-02-25 MED ORDER — PHENYTOIN SODIUM EXTENDED 100 MG PO CAPS: 200.0000 mg | ORAL_CAPSULE | Freq: Every day | ORAL | Status: DC

## 2012-02-25 NOTE — Progress Notes (Signed)
Pt had fine tremor movement in hands and head and speech was slurred. The entire episode lasted for 20 minutes. After the event pt was confused and very emotional. Pt was given reassurance. Pt was given PRN dose of Ativan. Pt is now cooperative and now asleep. I will continue to monitor the patient. Roosvelt Maser, RN

## 2012-02-25 NOTE — Progress Notes (Signed)
[facility] CHOSEN([name])LIASON INFORMED. FOR HH.HHRN/PT.

## 2012-02-25 NOTE — Progress Notes (Signed)
Patient assisted to the bathroom with hand held assist, walking back to the bed after using the bathroom, patient kneel to the floor with the assistance of  Staff, helped patient back to bed, No bruises, no open skin noted, head did not hit any object or equipment, wall or floor. Vitals  110/78, HR-88, 98.5, 99%-RA. Dr. Isidoro Donning outside the room notified her and she went in assessed patient, talk to patient/son about discharge. Patient's son at the bed side during the fall and stated they have been dealing with this for over 8years and she knows she can control it. Will continue to assess patient.

## 2012-02-25 NOTE — Progress Notes (Signed)
Per MD, Pt medically ready for d/c.  Spoke with Pt re: outpt tx.  Pt amenable and accepted information on [facility].  Female visitor present had been to [facility] and stated that he can assist Pt.  Pt denied current SI, HI, AVH, paranoia, delusions.  CSW thanked Pt for time and assistance.  Providence Crosby, LCSWA Clinical Social Work [phone]

## 2012-02-25 NOTE — Discharge Summary (Signed)
Physician Discharge Summary  Patient ID: Ruth Bell MRN: [id number] DOB/AGE: October 17, 1960 51 y.o.  Admit date: 02/21/2012 Discharge date: 02/25/2012  Primary Care Physician:  Lemont Fillers., NP  Discharge Diagnoses:    . Anxiety and depression . HTN (hypertension) . pseudoseizures    Consults:  Neurology, Dr. Ferne Coe                    Psychiatry, Dr. Theotis Barrio    Discharge Medications:   Medication List     As of 02/25/2012 12:13 PM    STOP taking these medications         NUCYNTA 75 MG Tabs   Generic drug: Tapentadol HCl      TAKE these medications         albuterol 108 (90 BASE) MCG/ACT inhaler   Commonly known as: PROVENTIL HFA;VENTOLIN HFA   Inhale 2 puffs into the lungs every 6 (six) hours as needed for wheezing.      ARIPiprazole 2 MG tablet   Commonly known as: ABILIFY   Take 1 tablet (2 mg total) by mouth daily.      citalopram 40 MG tablet   Commonly known as: CELEXA   Take 40 mg by mouth every morning.      gabapentin 600 MG tablet   Commonly known as: NEURONTIN   Take 600 mg by mouth 3 (three) times daily.      metoprolol tartrate 25 MG tablet   Commonly known as: LOPRESSOR   Take 1 tablet (25 mg total) by mouth 2 (two) times daily.      metroNIDAZOLE 0.75 % gel   Commonly known as: METROGEL   Apply 1 application topically 2 (two) times daily.      multivitamin with minerals Tabs   Take 1 tablet by mouth daily.      omeprazole 20 MG capsule   Commonly known as: PRILOSEC   Take 20 mg by mouth at bedtime.      phenytoin 100 MG ER capsule   Commonly known as: DILANTIN   Take 2 capsules (200 mg total) by mouth daily.      promethazine 25 MG tablet   Commonly known as: PHENERGAN   Take 25 mg by mouth every 6 (six) hours as needed. For nausea.      risperiDONE 1 MG disintegrating tablet   Commonly known as: RISPERDAL M-TABS   Take 1 tablet (1 mg total) by mouth at bedtime.         Brief H and P: For complete details please  refer to admission H and P, but in brief Ruth Bell is a 51 y.o. Female, past medical history of seizure disorder, history of noncompliance, hypertension who reported as not been taking her Dilantin for quite some time and in was brought in after she started having seizure like activity. In the ED, she was evaluated with normal blood work and a normal CT scan of the head. Dilantin level however was practically nonexistent. She continued to have several seizure like activity in the emergency room which were witnessed and she was given Ativan for this.   Hospital Course:  Pseudoseizures: Patient had multiple episodes of recurrent seizure like activity during hospitalization. Patient had reported that she had been out of Dilantin for more than a week prior to admission. Neurology was consulted and EEG was done and was negative for epileptiform activity. Patient had episode of shaking noted during the tracing but no epileptiform correlate was seen. Patient  was given IV Dilantin loading dose and subsequently her Dilantin level became high hence Dilantin was placed on hold. Per neurology, Dilantin was restarted today at 200 mg daily and unclear if it is being used for other purposes than seizure control as clearly the patient is having pseudoseizures. The seizure-like activity was witnessed by myself, multiple staff members and neurology. Patient remained conscious during the episodes and her family also confirmed that they have been able to talk her out of these episodes which have become chronic for last 8 years. Sitter was also placed in the room. Psychiatry was consulted and recommended continuing Celexa, starting Abilify and discontinuing risperidone. She was also recommended outpatient psychiatry resources for counseling. Patient was seen ambulating in the hallway without any difficulty. MRI brain showed no acute infarct or any structural abnormality. Patient's son assured that she will be provided 24/ 7  supervision. Home PT and home RN were arranged.   Day of Discharge BP 113/73  Pulse 95  Temp 98.5 F (36.9 C) (Oral)  Resp 18  Ht 5\' 2"  (1.575 m)  Wt 44.77 kg (98 lb 11.2 oz)  BMI 18.05 kg/m2  SpO2 100%  Physical Exam: General: Alert and awake oriented x3 not in any acute distress. HEENT: anicteric sclera, pupils reactive to light and accommodation CVS: S1-S2 clear no murmur rubs or gallops Chest: clear to auscultation bilaterally, no wheezing rales or rhonchi Abdomen: soft nontender, nondistended, normal bowel sounds, no organomegaly Extremities: no cyanosis, clubbing or edema noted bilaterally Neuro: Cranial nerves II-XII intact, no focal neurological deficits   The results of significant diagnostics from this hospitalization (including imaging, microbiology, ancillary and laboratory) are listed below for reference.    LAB RESULTS: Basic Metabolic Panel:  Lab 02/25/12 9604 02/22/12 0454  NA 138 142  K 4.4 3.5  CL 100 105  CO2 30 27  GLUCOSE 88 90  BUN 13 6  CREATININE 0.60 0.62  CALCIUM 9.8 8.8  MG -- --  PHOS -- --   Liver Function Tests:  Lab 02/25/12 0805 02/24/12 0516  AST -- --  ALT -- --  ALKPHOS -- --  BILITOT -- --  PROT -- --  ALBUMIN 3.7 3.7   CBC:  Lab 02/21/12 1010  WBC 7.2  NEUTROABS --  HGB 12.7  HCT 36.7  MCV 80.8  PLT 278   Cardiac Enzymes:  Lab 02/21/12 1719  CKTOTAL 68  CKMB --  CKMBINDEX --  TROPONINI --    Significant Diagnostic Studies:  No results found.   Disposition and Follow-up:     Discharge Orders    Future Appointments: Provider: Department: Dept Phone: Center:   03/21/2012 11:50 AM [facility] [phone] [facility]     Future Orders Please Complete By Expires   Diet - low sodium heart healthy      Increase activity slowly          DISPOSITION: home DIET:hear healthy ACTIVITY: as tolerated   DISCHARGE FOLLOW-UP Follow-up Information    Follow up with  Lemont Fillers., NP. Schedule an appointment as soon as possible for a visit in 10 days. (for hospital follow-up)    Contact information:   [address] [place] [address] [phone]          Time spent on Discharge: 37 mins  Signed:   Lambert Jeanty M.D. [facility] 02/25/2012, 12:13 PM Pager: [phone]

## 2012-02-25 NOTE — Progress Notes (Signed)
Subjective: Patient currently stable.  Had multiple episodes of jerking yesterday that were witnessed by his admitted doctor.  They were bilateral and patient was able to follow commands and speak throughout the episodes.  Work up has been unremarkable and includes an EEG that captured events without correlate and MRI that was unremarkable.     Objective: Current vital signs: BP 113/73  Pulse 95  Temp 98.5 F (36.9 C) (Oral)  Resp 18  Ht 5\' 2"  (1.575 m)  Wt 44.77 kg (98 lb 11.2 oz)  BMI 18.05 kg/m2  SpO2 100% Vital signs in last 24 hours: Temp:  [97.2 F (36.2 C)-98.6 F (37 C)] 98.5 F (36.9 C) (12/09 0556) Pulse Rate:  [79-95] 95  (12/09 0952) Resp:  [18] 18  (12/09 0556) BP: (100-136)/(55-85) 113/73 mmHg (12/09 0952) SpO2:  [97 %-100 %] 100 % (12/09 0556)  Intake/Output from previous day: 12/08 0701 - 12/09 0700 In: 120 [P.O.:120] Out: -  Intake/Output this shift: Total I/O In: 360 [P.O.:360] Out: -  Nutritional status: Sodium Restricted  Neurologic Exam: Mental Status:  Alert, oriented, thought content appropriate. Speech fluent without evidence of aphasia. Able to follow 3 step commands without difficulty.  Cranial Nerves:  II: Discs flat bilaterally; Visual fields grossly normal, pupils equal, round, reactive to light and accommodation  III,IV, VI: ptosis not present, extra-ocular motions intact bilaterally  V,VII: smile symmetric, facial light touch sensation normal bilaterally  VIII: hearing normal bilaterally  IX,X: gag reflex present  XI: bilateral shoulder shrug  XII: midline tongue extension  Motor:  Right : Upper extremity 5/5           Left: Upper extremity 5/5   Lower extremity 5/5        Lower extremity 5/5  Sensory: Pinprick and light touch intact throughout, bilaterally  Deep Tendon Reflexes: 2+ and symmetric throughout  Plantars:  Right: downgoing      Left: downgoing   Lab Results: Basic Metabolic Panel:  Lab 02/25/12 1610 02/22/12 0454  02/21/12 1010  NA 138 142 138  K 4.4 3.5 3.4*  CL 100 105 103  CO2 30 27 24   GLUCOSE 88 90 118*  BUN 13 6 7   CREATININE 0.60 0.62 0.50  CALCIUM 9.8 8.8 9.0  MG -- -- --  PHOS -- -- --    Liver Function Tests:  Lab 02/25/12 0805 02/24/12 0516 02/22/12 1145  AST -- -- --  ALT -- -- --  ALKPHOS -- -- --  BILITOT -- -- --  PROT -- -- --  ALBUMIN 3.7 3.7 3.4*   No results found for this basename: LIPASE:5,AMYLASE:5 in the last 168 hours No results found for this basename: AMMONIA:3 in the last 168 hours  CBC:  Lab 02/21/12 1010  WBC 7.2  NEUTROABS --  HGB 12.7  HCT 36.7  MCV 80.8  PLT 278    Cardiac Enzymes:  Lab 02/21/12 1719  CKTOTAL 68  CKMB --  CKMBINDEX --  TROPONINI --    Lipid Panel: No results found for this basename: CHOL:5,TRIG:5,HDL:5,CHOLHDL:5,VLDL:5,LDLCALC:5 in the last 168 hours  CBG: No results found for this basename: GLUCAP:5 in the last 168 hours  Microbiology: Results for orders placed in visit on 06/19/11  URINE CULTURE     Status: Normal   Collection Time   06/19/11  2:53 PM      Component Value Range Status Comment   Colony Count NO GROWTH   Final    Organism ID, Bacteria NO GROWTH  Final   WET PREP BY MOLECULAR PROBE     Status: Abnormal   Collection Time   06/19/11  2:55 PM      Component Value Range Status Comment   Candida species NEG  Negative Final    Trichomonas vaginosis NEG  Negative Final    Gardnerella vaginalis POS (*) Negative Final     Coagulation Studies: No results found for this basename: LABPROT:5,INR:5 in the last 72 hours  Imaging: Mr Brain Wo Contrast  02/24/2012  *RADIOLOGY REPORT*  Clinical Data: Seizures versus there is seizure. The examination had to be discontinued prior to completion due to seizure-like activity while in the scanner.  The rapid response team was called. The patient was transported back to the floor.  MRI HEAD WITHOUT CONTRAST  Technique:  Multiplanar, multiecho pulse sequences of the  brain and surrounding structures were obtained according to standard protocol without intravenous contrast.  Comparison: None.  Findings: The diffusion sequence demonstrates no evidence for acute or subacute infarction.  No significant white matter disease is evident.  There is no significant susceptibility.  IMPRESSION: Normal appearance of the diffusion sequence.   Original Report Authenticated By: Marin Roberts, M.D.     Medications:  I have reviewed the patient's current medications. Scheduled:   . acetaminophen  650 mg Oral Once  . ARIPiprazole  2 mg Oral Daily  . citalopram  40 mg Oral q morning - 10a  . enoxaparin (LOVENOX) injection  30 mg Subcutaneous Q24H  . gabapentin  600 mg Oral TID  . metoprolol tartrate  25 mg Oral BID  . multivitamin with minerals  1 tablet Oral Daily  . pantoprazole  40 mg Oral Daily  . phenytoin  200 mg Oral Daily  . [DISCONTINUED] risperiDONE  1 mg Oral BID    Assessment/Plan:  Patient Active Hospital Problem List: Pseudoseizure (02/21/2012)   Assessment: No evidence of epileptic seizures.  Condition explained to both patient and son.  Patient on Dilantin.  Level 16.6 today and patient started back on 200mg  a day.  Unclear if it may be used for other purposes other than seizure control in this patient.  If only for seizure control may be discharged on an outpatient basis.   Plan: No further work up recommended    LOS: 4 days   Thana Farr, MD [facility] [phone] 02/25/2012  1:05 PM

## 2012-02-25 NOTE — Progress Notes (Signed)
Patient discharge home with son, discharge instructions given and explained to patient/son and they verbalized understanding, patient denies any pain. Skin intact, no wound. Accompanied home by son.

## 2012-02-26 ENCOUNTER — Telehealth: Payer: Self-pay | Admitting: Family

## 2012-02-26 NOTE — Telephone Encounter (Signed)
Left message for patient to return my call.

## 2012-02-26 NOTE — Telephone Encounter (Signed)
Pls call pt to schedule a 1 week post hospital follow up.

## 2012-02-27 NOTE — Telephone Encounter (Signed)
Patient scheduled appointment for 02/29/12

## 2012-02-29 ENCOUNTER — Encounter: Payer: Self-pay | Admitting: Family

## 2012-02-29 ENCOUNTER — Ambulatory Visit (INDEPENDENT_AMBULATORY_CARE_PROVIDER_SITE_OTHER): Payer: 59 | Admitting: Family

## 2012-02-29 VITALS — BP 118/86 | HR 75 | Temp 98.5°F | Resp 16 | Ht 62.0 in | Wt 101.1 lb

## 2012-02-29 DIAGNOSIS — G40909 Epilepsy, unspecified, not intractable, without status epilepticus: Secondary | ICD-10-CM

## 2012-02-29 DIAGNOSIS — Z23 Encounter for immunization: Secondary | ICD-10-CM

## 2012-02-29 MED ORDER — CITALOPRAM HYDROBROMIDE 40 MG PO TABS: 40.0000 mg | ORAL_TABLET | Freq: Every morning | ORAL | Status: DC

## 2012-02-29 MED ORDER — PANTOPRAZOLE SODIUM 40 MG PO TBEC: 40.0000 mg | DELAYED_RELEASE_TABLET | Freq: Every day | ORAL | Status: DC

## 2012-02-29 NOTE — Patient Instructions (Addendum)
Dr. Evelene Croon- psychiatry- call to make an appointment. [address], [place], [place] [address] [phone] Call [facility] to arrange a therapist. Please follow up in 2 months.

## 2012-02-29 NOTE — Progress Notes (Signed)
Subjective:    Patient ID: Ruth Bell, female    DOB: 11-Mar-1961, 51 y.o.   MRN: [id number]  HPI  Ms.  Bell is a 51 yr old female who presents today for her post hospital follow up visit. She was admitted to [facility] on 12/5-12/9 following a seizure.  She had apparently been out of her dilantin x 1 week.  Hospital records are reviewed.  She underwent an EEG which was normal.  Neuro was consulted and it was felt that her symptoms were due to pseudo seizure.    Review of Systems See HPI  Past Medical History  Diagnosis Date  . Seizures     history of pseudoseizure disorder  . Migraine, unspecified, without mention of intractable migraine without mention of status migrainosus   . Anemia   . Menometrorrhagia 2006  . Uterine leiomyoma 2006  . Depression   . Hypertension   . Tubular adenoma of colon 05/2011    History   Social History  . Marital Status: Married    Spouse Name: Ruth Bell    Number of Children: N/A  . Years of Education: N/A   Occupational History  . CMA    Social History Main Topics  . Smoking status: Current Some Day Smoker -- 0.5 packs/day for 40 years    Types: Cigarettes  . Smokeless tobacco: Never Used  . Alcohol Use: No  . Drug Use: No  . Sexually Active: Yes    Birth Control/ Protection: None   Other Topics Concern  . Not on file   Social History Narrative  . No narrative on file    Past Surgical History  Procedure Date  . Appendectomy   . Bilateral salpingoophorectomy 04/06/04  . Abdominal hysterectomy 04/06/04  . Rotator cuff repair     Family History  Problem Relation Age of Onset  . Diabetes Other   . Hypertension Other   . Heart disease Mother   . Colon cancer Father     No Known Allergies  Current Outpatient Prescriptions on File Prior to Visit  Medication Sig Dispense Refill  . albuterol (PROVENTIL HFA;VENTOLIN HFA) 108 (90 BASE) MCG/ACT inhaler Inhale 2 puffs into the lungs every 6 (six) hours as needed for wheezing.  1 Inhaler   2  . ARIPiprazole (ABILIFY) 2 MG tablet Take 1 tablet (2 mg total) by mouth daily.  30 tablet  3  . citalopram (CELEXA) 40 MG tablet Take 1 tablet (40 mg total) by mouth every morning.  30 tablet  5  . gabapentin (NEURONTIN) 600 MG tablet Take 600 mg by mouth 3 (three) times daily.       . metoprolol tartrate (LOPRESSOR) 25 MG tablet Take 1 tablet (25 mg total) by mouth 2 (two) times daily.  60 tablet  3  . metroNIDAZOLE (METROGEL) 0.75 % gel Apply 1 application topically 2 (two) times daily.      . Multiple Vitamin (MULITIVITAMIN WITH MINERALS) TABS Take 1 tablet by mouth daily.      . phenytoin (DILANTIN) 100 MG ER capsule Take 2 capsules (200 mg total) by mouth daily.  60 capsule  3  . promethazine (PHENERGAN) 25 MG tablet Take 25 mg by mouth every 6 (six) hours as needed. For nausea.      . risperiDONE (RISPERDAL M-TABS) 1 MG disintegrating tablet Take 1 mg by mouth at bedtime.      . pantoprazole (PROTONIX) 40 MG tablet Take 1 tablet (40 mg total) by mouth daily.  30  tablet  3    BP 118/86  Pulse 75  Temp 98.5 F (36.9 C) (Oral)  Resp 16  Ht 5\' 2"  (1.575 m)  Wt 101 lb 1.9 oz (45.868 kg)  BMI 18.50 kg/m2  SpO2 99%       Objective:   Physical Exam  Constitutional: She appears well-developed and well-nourished. No distress.  Cardiovascular: Normal rate and regular rhythm.   No murmur heard. Pulmonary/Chest: Effort normal and breath sounds normal. No respiratory distress. She has no wheezes. She has no rales. She exhibits no tenderness.  Musculoskeletal: She exhibits no edema.  Psychiatric: She has a normal mood and affect. Her behavior is normal. Judgment and thought content normal.          Assessment & Plan:

## 2012-03-02 NOTE — Assessment & Plan Note (Addendum)
Felt Pseudoseizures per neuro.  We discussed the importance of working on her anxiety.  She continues to feel guilty about not working.  She will call [facility] to arrange therapist. Dr. Carie Caddy number given for her to call for psychiatry.  15 minutes spent with pt today.  >50% of this time was spent counseling pt on her seizures and anxiety.

## 2012-03-13 ENCOUNTER — Encounter: Payer: Self-pay | Admitting: Family Medicine

## 2012-03-13 ENCOUNTER — Ambulatory Visit (INDEPENDENT_AMBULATORY_CARE_PROVIDER_SITE_OTHER): Payer: 59 | Admitting: Family Medicine

## 2012-03-13 VITALS — BP 108/69 | HR 86 | Temp 99.5°F | Resp 16 | Ht 62.0 in | Wt 108.0 lb

## 2012-03-13 DIAGNOSIS — L03213 Periorbital cellulitis: Secondary | ICD-10-CM

## 2012-03-13 DIAGNOSIS — H00039 Abscess of eyelid unspecified eye, unspecified eyelid: Secondary | ICD-10-CM

## 2012-03-13 MED ORDER — OXYCODONE-ACETAMINOPHEN 5-325 MG PREPACK: ORAL_TABLET | ORAL | Status: DC

## 2012-03-13 MED ORDER — CEFUROXIME AXETIL 500 MG PO TABS: 500.0000 mg | ORAL_TABLET | Freq: Two times a day (BID) | ORAL | Status: DC

## 2012-03-13 MED ORDER — CEFTRIAXONE SODIUM 1 G IJ SOLR
1.0000 g | Freq: Once | INTRAMUSCULAR | Status: AC
  Administered 2012-03-13: 1 g via INTRAMUSCULAR

## 2012-03-13 MED ORDER — CEFTRIAXONE SODIUM 1 G IJ SOLR: 1.0000 g | Freq: Once | INTRAMUSCULAR | Status: DC

## 2012-03-13 NOTE — Assessment & Plan Note (Signed)
Rocephin 1g IM in office today. Ceftin 500mg  bid x 9d starting tomorrow. Percocet 5/500, 1-2 tabs po q6h prn, #15. Close f/u either tomorrow or Monday.

## 2012-03-13 NOTE — Progress Notes (Signed)
OFFICE NOTE  03/13/2012  CC:  Chief Complaint  Patient presents with  . Facial Swelling    left eye with itching, ? insect bite in area.   . Facial Swelling    now to right area of eye cheek bone area.  . Nausea  . Headache     HPI: Patient is a 51 y.o. African-American female who is here for left sided facial swelling.   Onset yesterday upon awakening, swelling in left infraorbital region, itching there and in general peri-orbital area. Giving her a left sided headache.  Had T 102 this morning.  No URI or sinusitis sx's lately.  Some scratchy left sided-ST pain started today.  No cough.  No other areas of redness or swelling or itching. Nevers sees spiders in her room or living room where she usually sleeps.  Pertinent PMH:  Past Medical History  Diagnosis Date  . Seizures     history of pseudoseizure disorder  . Migraine, unspecified, without mention of intractable migraine without mention of status migrainosus   . Anemia   . Menometrorrhagia 2006  . Uterine leiomyoma 2006  . Depression   . Hypertension   . Tubular adenoma of colon 05/2011   Past surgical, social, and family history reviewed and no changes noted since last office visit.  MEDS:  Outpatient Prescriptions Prior to Visit  Medication Sig Dispense Refill  . albuterol (PROVENTIL HFA;VENTOLIN HFA) 108 (90 BASE) MCG/ACT inhaler Inhale 2 puffs into the lungs every 6 (six) hours as needed for wheezing.  1 Inhaler  2  . ARIPiprazole (ABILIFY) 2 MG tablet Take 1 tablet (2 mg total) by mouth daily.  30 tablet  3  . citalopram (CELEXA) 40 MG tablet Take 1 tablet (40 mg total) by mouth every morning.  30 tablet  5  . gabapentin (NEURONTIN) 600 MG tablet Take 600 mg by mouth 3 (three) times daily.       . metoprolol tartrate (LOPRESSOR) 25 MG tablet Take 1 tablet (25 mg total) by mouth 2 (two) times daily.  60 tablet  3  . metroNIDAZOLE (METROGEL) 0.75 % gel Apply 1 application topically 2 (two) times daily.      .  Multiple Vitamin (MULITIVITAMIN WITH MINERALS) TABS Take 1 tablet by mouth daily.      . nicotine (NICODERM CQ - DOSED IN MG/24 HR) 7 mg/24hr patch Place 1 patch onto the skin daily.      . pantoprazole (PROTONIX) 40 MG tablet Take 1 tablet (40 mg total) by mouth daily.  30 tablet  3  . phenytoin (DILANTIN) 100 MG ER capsule Take 2 capsules (200 mg total) by mouth daily.  60 capsule  3  . promethazine (PHENERGAN) 25 MG tablet Take 25 mg by mouth every 6 (six) hours as needed. For nausea.      . risperiDONE (RISPERDAL M-TABS) 1 MG disintegrating tablet Take 1 mg by mouth at bedtime.       Last reviewed on 03/13/2012  3:43 PM by Jeoffrey Massed, MD  PE: Blood pressure 108/69, pulse 86, temperature 99.5 F (37.5 C), temperature source Oral, resp. rate 16, height 5\' 2"  (1.575 m), weight 108 lb (48.988 kg), SpO2 98.00%. Gen: alert, in NAD.  Appears in moderate pain, discomfort with light coming through the window. Left zygomatic region with mild soft tissue swelling.  This swelling extends into the left nasal region and left infra/peri-orbital region.  EOMI.  No bulbar injection, no eye exudate.  Nasal passages clear.  Oropharynx without erythema, swelling, or exudate.  IMPRESSION AND PLAN:  Periorbital cellulitis Rocephin 1g IM in office today. Ceftin 500mg  bid x 9d starting tomorrow. Percocet 5/500, 1-2 tabs po q6h prn, #15. Close f/u either tomorrow or Monday.   An After Visit Summary was printed and given to the patient.

## 2012-03-14 ENCOUNTER — Telehealth: Payer: Self-pay | Admitting: *Deleted

## 2012-03-14 ENCOUNTER — Ambulatory Visit: Payer: 59 | Admitting: Family

## 2012-03-14 NOTE — Telephone Encounter (Signed)
FYI: Caller reports that when she went to pt's home today for PT, pt became unresponsive [family stated that pt has Hx of Pseudo-seizures]; EMS/911 was called per protocol but pt's father Dennis Bast has POA] was refusing pt transport to hospital for further A&E/SLS

## 2012-03-18 NOTE — Telephone Encounter (Signed)
pls call pt and remind her to arrange appointment with [facility] for therapy and dr. Evelene Croon for psychiatry evaluation. She should also follow up with her neurologist.

## 2012-03-20 NOTE — Telephone Encounter (Signed)
Patient informed, did not seek medical A&E after possible seizure on 12.31.13, states she "did not want to go back to hospital", placed Westley Gambles & CMA]on the telephone; wrote down provider instructions, states already spoken to [facility] & will follow-up with others, as instructed/SLS

## 2012-03-21 ENCOUNTER — Ambulatory Visit
Admission: RE | Admit: 2012-03-21 | Discharge: 2012-03-21 | Disposition: A | Discharge status: Home / Self Care | Payer: 59 | Source: Ambulatory Visit | Attending: Family | Admitting: Family

## 2012-03-21 DIAGNOSIS — Z1231 Encounter for screening mammogram for malignant neoplasm of breast: Secondary | ICD-10-CM

## 2012-03-26 ENCOUNTER — Telehealth: Payer: Self-pay | Admitting: *Deleted

## 2012-03-26 NOTE — Telephone Encounter (Signed)
Pt called requesting address and contact information for Dr Evelene Croon (psychiatrist) that pt had been referred to previously. Information given to pt:  Address: [address], [place], [place] [address]   [phone]

## 2012-04-28 ENCOUNTER — Encounter: Payer: Self-pay | Admitting: Family

## 2012-04-28 ENCOUNTER — Ambulatory Visit (INDEPENDENT_AMBULATORY_CARE_PROVIDER_SITE_OTHER): Payer: 59 | Admitting: Family

## 2012-04-28 VITALS — BP 140/92 | HR 60 | Temp 98.7°F | Resp 16 | Ht 62.0 in | Wt 106.0 lb

## 2012-04-28 DIAGNOSIS — R569 Unspecified convulsions: Secondary | ICD-10-CM

## 2012-04-28 DIAGNOSIS — F445 Conversion disorder with seizures or convulsions: Secondary | ICD-10-CM

## 2012-04-28 DIAGNOSIS — F341 Dysthymic disorder: Secondary | ICD-10-CM

## 2012-04-28 DIAGNOSIS — F32A Depression, unspecified: Secondary | ICD-10-CM

## 2012-04-28 DIAGNOSIS — F419 Anxiety disorder, unspecified: Secondary | ICD-10-CM

## 2012-04-28 DIAGNOSIS — I10 Essential (primary) hypertension: Secondary | ICD-10-CM

## 2012-04-28 NOTE — Progress Notes (Signed)
Subjective:    Patient ID: Ruth Bell, female    DOB: 1960/08/10, 52 y.o.   MRN: [id number]  HPI  Ms.  Bell is a 52 yr old female who presents today for follow up.  1) Seizure Disorder-  She due to follow up with Dr. Hyacinth Meeker.  Will schedule follow up.    2) Anxiety/Depression- could not establish with psychiatry due to cost.  Currently on abilify, citalopram, and remeron.  Feels that her depression and anxiety are well controlled.   3) HTN- Currently maintained on metoprolol and amlodipine. Denies CP, SOB, and swelling. Reports metoprol makes her sleepy, but not bad enough to discontinue med.    Review of Systems See HPI  Past Medical History  Diagnosis Date  . Seizures     history of pseudoseizure disorder  . Migraine, unspecified, without mention of intractable migraine without mention of status migrainosus   . Anemia   . Menometrorrhagia 2006  . Uterine leiomyoma 2006  . Depression   . Hypertension   . Tubular adenoma of colon 05/2011    History   Social History  . Marital Status: Married    Spouse Name: Ruth Bell    Number of Children: N/A  . Years of Education: N/A   Occupational History  . CMA    Social History Main Topics  . Smoking status: Current Some Day Smoker -- 0.50 packs/day for 40 years    Types: Cigarettes  . Smokeless tobacco: Never Used  . Alcohol Use: No  . Drug Use: No  . Sexually Active: Yes    Birth Control/ Protection: None   Other Topics Concern  . Not on file   Social History Narrative  . No narrative on file    Past Surgical History  Procedure Laterality Date  . Appendectomy    . Bilateral salpingoophorectomy  04/06/04  . Abdominal hysterectomy  04/06/04  . Rotator cuff repair      Family History  Problem Relation Age of Onset  . Diabetes Other   . Hypertension Other   . Heart disease Mother   . Colon cancer Father     No Known Allergies  Current Outpatient Prescriptions on File Prior to Visit  Medication Sig Dispense  Refill  . albuterol (PROVENTIL HFA;VENTOLIN HFA) 108 (90 BASE) MCG/ACT inhaler Inhale 2 puffs into the lungs every 6 (six) hours as needed for wheezing.  1 Inhaler  2  . ARIPiprazole (ABILIFY) 2 MG tablet Take 1 tablet (2 mg total) by mouth daily.  30 tablet  3  . citalopram (CELEXA) 40 MG tablet Take 1 tablet (40 mg total) by mouth every morning.  30 tablet  5  . gabapentin (NEURONTIN) 600 MG tablet Take 600 mg by mouth 3 (three) times daily.       . metoprolol tartrate (LOPRESSOR) 25 MG tablet Take 1 tablet (25 mg total) by mouth 2 (two) times daily.  60 tablet  3  . metroNIDAZOLE (METROGEL) 0.75 % gel Apply 1 application topically 2 (two) times daily.      . Multiple Vitamin (MULITIVITAMIN WITH MINERALS) TABS Take 1 tablet by mouth daily.      . nicotine (NICODERM CQ - DOSED IN MG/24 HR) 7 mg/24hr patch Place 1 patch onto the skin daily.      Marland Kitchen oxyCODONE-acetaminophen (PERCOCET) 5-325 mg TABS 1-2 tabs po q6h prn pain  15 tablet  0  . pantoprazole (PROTONIX) 40 MG tablet Take 1 tablet (40 mg total) by mouth daily.  30 tablet  3  . phenytoin (DILANTIN) 100 MG ER capsule Take 2 capsules (200 mg total) by mouth daily.  60 capsule  3  . promethazine (PHENERGAN) 25 MG tablet Take 25 mg by mouth every 6 (six) hours as needed. For nausea.      . risperiDONE (RISPERDAL M-TABS) 1 MG disintegrating tablet Take 1 mg by mouth at bedtime.      . mirtazapine (REMERON SOL-TAB) 15 MG disintegrating tablet Take 1 tablet (15 mg total) by mouth at bedtime.  30 tablet  0   No current facility-administered medications on file prior to visit.    BP 140/92  Pulse 60  Temp(Src) 98.7 F (37.1 C) (Oral)  Resp 16  Ht 5\' 2"  (1.575 m)  Wt 106 lb (48.081 kg)  BMI 19.38 kg/m2  SpO2 99%       Objective:   Physical Exam  Constitutional: She is oriented to person, place, and time. She appears well-developed and well-nourished. No distress.  HENT:  Head: Normocephalic and atraumatic.  Cardiovascular: Normal rate  and regular rhythm.   No murmur heard. Pulmonary/Chest: Effort normal and breath sounds normal. No respiratory distress. She has no wheezes. She has no rales. She exhibits no tenderness.  Abdominal: Soft. Bowel sounds are normal.  Musculoskeletal: She exhibits no edema.  Neurological: She is alert and oriented to person, place, and time.  Skin: Skin is warm and dry.  Psychiatric: She has a normal mood and affect. Her behavior is normal. Judgment and thought content normal.          Assessment & Plan:

## 2012-04-28 NOTE — Assessment & Plan Note (Signed)
Clinically well controlled, she will arrange follow up with neuro.  Continue neurontin.

## 2012-04-28 NOTE — Patient Instructions (Addendum)
Please schedule a follow up appointment in 3 months.

## 2012-04-28 NOTE — Assessment & Plan Note (Signed)
BP Readings from Last 3 Encounters:  04/28/12 140/92  03/13/12 108/69  02/29/12 118/86   BP up slightly today. Continue current meds for now, monitor.

## 2012-04-28 NOTE — Assessment & Plan Note (Signed)
Well controlled on current meds. Unable to afford psychiatry referral. Continue same.

## 2012-05-09 ENCOUNTER — Emergency Department (HOSPITAL_COMMUNITY): Payer: 59

## 2012-05-09 ENCOUNTER — Emergency Department (HOSPITAL_COMMUNITY)
Admission: EM | Admit: 2012-05-09 | Discharge: 2012-05-09 | Disposition: A | Discharge status: Home / Self Care | Payer: 59 | Attending: Emergency Medicine | Admitting: Emergency Medicine

## 2012-05-09 ENCOUNTER — Encounter (HOSPITAL_COMMUNITY): Payer: Self-pay | Admitting: Emergency Medicine

## 2012-05-09 DIAGNOSIS — G40909 Epilepsy, unspecified, not intractable, without status epilepticus: Secondary | ICD-10-CM | POA: Insufficient documentation

## 2012-05-09 DIAGNOSIS — I10 Essential (primary) hypertension: Secondary | ICD-10-CM | POA: Insufficient documentation

## 2012-05-09 DIAGNOSIS — F3289 Other specified depressive episodes: Secondary | ICD-10-CM | POA: Insufficient documentation

## 2012-05-09 DIAGNOSIS — IMO0002 Reserved for concepts with insufficient information to code with codable children: Secondary | ICD-10-CM | POA: Insufficient documentation

## 2012-05-09 DIAGNOSIS — G43909 Migraine, unspecified, not intractable, without status migrainosus: Secondary | ICD-10-CM | POA: Insufficient documentation

## 2012-05-09 DIAGNOSIS — F17200 Nicotine dependence, unspecified, uncomplicated: Secondary | ICD-10-CM | POA: Insufficient documentation

## 2012-05-09 DIAGNOSIS — Z90710 Acquired absence of both cervix and uterus: Secondary | ICD-10-CM | POA: Insufficient documentation

## 2012-05-09 DIAGNOSIS — Y9389 Activity, other specified: Secondary | ICD-10-CM | POA: Insufficient documentation

## 2012-05-09 DIAGNOSIS — Z8739 Personal history of other diseases of the musculoskeletal system and connective tissue: Secondary | ICD-10-CM | POA: Insufficient documentation

## 2012-05-09 DIAGNOSIS — F329 Major depressive disorder, single episode, unspecified: Secondary | ICD-10-CM | POA: Insufficient documentation

## 2012-05-09 DIAGNOSIS — S0993XA Unspecified injury of face, initial encounter: Secondary | ICD-10-CM | POA: Insufficient documentation

## 2012-05-09 DIAGNOSIS — Z9889 Other specified postprocedural states: Secondary | ICD-10-CM | POA: Insufficient documentation

## 2012-05-09 DIAGNOSIS — Y92410 Unspecified street and highway as the place of occurrence of the external cause: Secondary | ICD-10-CM | POA: Insufficient documentation

## 2012-05-09 DIAGNOSIS — M542 Cervicalgia: Secondary | ICD-10-CM

## 2012-05-09 DIAGNOSIS — R569 Unspecified convulsions: Secondary | ICD-10-CM

## 2012-05-09 DIAGNOSIS — Z862 Personal history of diseases of the blood and blood-forming organs and certain disorders involving the immune mechanism: Secondary | ICD-10-CM | POA: Insufficient documentation

## 2012-05-09 DIAGNOSIS — Z8742 Personal history of other diseases of the female genital tract: Secondary | ICD-10-CM | POA: Insufficient documentation

## 2012-05-09 DIAGNOSIS — S199XXA Unspecified injury of neck, initial encounter: Secondary | ICD-10-CM | POA: Insufficient documentation

## 2012-05-09 DIAGNOSIS — Z79899 Other long term (current) drug therapy: Secondary | ICD-10-CM | POA: Insufficient documentation

## 2012-05-09 DIAGNOSIS — Z8719 Personal history of other diseases of the digestive system: Secondary | ICD-10-CM | POA: Insufficient documentation

## 2012-05-09 LAB — BASIC METABOLIC PANEL
BUN: 9 mg/dL (ref 6–23)
CO2: 25 mEq/L (ref 19–32)
Calcium: 8.8 mg/dL (ref 8.4–10.5)
Chloride: 102 mEq/L (ref 96–112)
Creatinine, Ser: 0.49 mg/dL — ABNORMAL LOW (ref 0.50–1.10)
GFR calc Af Amer: >90 mL/min (ref >90)
GFR calc non Af Amer: >90 mL/min (ref >90)
Glucose, Bld: 79 mg/dL (ref 70–99)
Potassium: 3.6 mEq/L (ref 3.5–5.1)
Sodium: 138 mEq/L (ref 135–145)

## 2012-05-09 LAB — PHENYTOIN LEVEL, TOTAL: Phenytoin Lvl: <2.5 ug/mL — ABNORMAL LOW (ref 10.0–20.0)

## 2012-05-09 MED ORDER — LORAZEPAM 2 MG/ML IJ SOLN
INTRAMUSCULAR | Status: AC
  Administered 2012-05-09: 1 mg via INTRAMUSCULAR
  Filled 2012-05-09: qty 1

## 2012-05-09 MED ORDER — PHENYTOIN SODIUM EXTENDED 100 MG PO CAPS
400.0000 mg | ORAL_CAPSULE | Freq: Once | ORAL | Status: AC
  Administered 2012-05-09: 400 mg via ORAL
  Filled 2012-05-09: qty 4

## 2012-05-09 MED ORDER — SODIUM CHLORIDE 0.9 % IV BOLUS (SEPSIS)
1000.0000 mL | Freq: Once | INTRAVENOUS | Status: AC
  Administered 2012-05-09: 1000 mL via INTRAVENOUS

## 2012-05-09 MED ORDER — LORAZEPAM 2 MG/ML IJ SOLN
1.0000 mg | Freq: Once | INTRAMUSCULAR | Status: AC
  Administered 2012-05-09: 1 mg via INTRAMUSCULAR

## 2012-05-09 MED ORDER — PHENYTOIN SODIUM EXTENDED 100 MG PO CAPS: 200.0000 mg | ORAL_CAPSULE | Freq: Every day | ORAL | Status: DC
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
## 2012-05-12 ENCOUNTER — Telehealth: Payer: Self-pay | Admitting: *Deleted

## 2012-05-12 MED ORDER — HYDROCODONE-ACETAMINOPHEN 5-325 MG PO TABS: 1.0000 | ORAL_TABLET | Freq: Four times a day (QID) | ORAL | Status: DC | PRN
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
## 2012-05-12 NOTE — Telephone Encounter (Signed)
rx called in to her pharmacy for hydrocodone- use short term, do not drive after taking due to risk of drowsiness.
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
## 2012-05-14 ENCOUNTER — Ambulatory Visit: Payer: 59 | Admitting: Family

## 2012-05-16 ENCOUNTER — Encounter: Payer: Self-pay | Admitting: Family

## 2012-05-16 ENCOUNTER — Telehealth: Payer: Self-pay | Admitting: *Deleted

## 2012-05-16 ENCOUNTER — Ambulatory Visit (HOSPITAL_BASED_OUTPATIENT_CLINIC_OR_DEPARTMENT_OTHER): Admission: RE | Admit: 2012-05-16 | Payer: 59 | Source: Ambulatory Visit

## 2012-05-16 ENCOUNTER — Ambulatory Visit (INDEPENDENT_AMBULATORY_CARE_PROVIDER_SITE_OTHER): Payer: 59 | Admitting: Family

## 2012-05-16 VITALS — BP 160/96 | HR 88 | Temp 98.3°F | Resp 16 | Ht 62.0 in | Wt 106.0 lb

## 2012-05-16 DIAGNOSIS — R1011 Right upper quadrant pain: Secondary | ICD-10-CM

## 2012-05-16 DIAGNOSIS — F445 Conversion disorder with seizures or convulsions: Secondary | ICD-10-CM

## 2012-05-16 DIAGNOSIS — M542 Cervicalgia: Secondary | ICD-10-CM

## 2012-05-16 DIAGNOSIS — Z5181 Encounter for therapeutic drug level monitoring: Secondary | ICD-10-CM

## 2012-05-16 DIAGNOSIS — K920 Hematemesis: Secondary | ICD-10-CM

## 2012-05-16 DIAGNOSIS — R569 Unspecified convulsions: Secondary | ICD-10-CM

## 2012-05-16 LAB — CBC WITH DIFFERENTIAL/PLATELET
Basophils Absolute: 0 10*3/uL (ref 0.0–0.1)
Basophils Relative: 0 % (ref 0–1)
Eosinophils Absolute: 0.1 10*3/uL (ref 0.0–0.7)
Eosinophils Relative: 2 % (ref 0–5)
HCT: 40.2 % (ref 36.0–46.0)
Hemoglobin: 13.7 g/dL (ref 12.0–15.0)
Lymphocytes Relative: 40 % (ref 12–46)
Lymphs Abs: 2.5 10*3/uL (ref 0.7–4.0)
MCH: 27.3 pg (ref 26.0–34.0)
MCHC: 34.1 g/dL (ref 30.0–36.0)
MCV: 80.1 fL (ref 78.0–100.0)
Monocytes Absolute: 0.4 10*3/uL (ref 0.1–1.0)
Monocytes Relative: 6 % (ref 3–12)
Neutro Abs: 3.2 10*3/uL (ref 1.7–7.7)
Neutrophils Relative %: 52 % (ref 43–77)
Platelets: 332 10*3/uL (ref 150–400)
RBC: 5.02 MIL/uL (ref 3.87–5.11)
RDW: 16.2 % — ABNORMAL HIGH (ref 11.5–15.5)
WBC: 6.2 10*3/uL (ref 4.0–10.5)

## 2012-05-16 LAB — BASIC METABOLIC PANEL WITH GFR
BUN: 9 mg/dL (ref 6–23)
CO2: 24 mEq/L (ref 19–32)
Calcium: 9.9 mg/dL (ref 8.4–10.5)
Chloride: 102 mEq/L (ref 96–112)
Creat: 0.52 mg/dL (ref 0.50–1.10)
GFR, Est African American: >89 mL/min
GFR, Est Non African American: >89 mL/min
Glucose, Bld: 79 mg/dL (ref 70–99)
Potassium: 4.1 mEq/L (ref 3.5–5.3)
Sodium: 137 mEq/L (ref 135–145)

## 2012-05-16 LAB — HEPATIC FUNCTION PANEL
ALT: 17 U/L (ref 0–35)
AST: 17 U/L (ref 0–37)
Albumin: 4.8 g/dL (ref 3.5–5.2)
Alkaline Phosphatase: 79 U/L (ref 39–117)
Bilirubin, Direct: 0.1 mg/dL (ref 0.0–0.3)
Indirect Bilirubin: 0.2 mg/dL (ref 0.0–0.9)
Total Bilirubin: 0.3 mg/dL (ref 0.3–1.2)
Total Protein: 8 g/dL (ref 6.0–8.3)

## 2012-05-16 LAB — LIPASE: Lipase: 41 U/L (ref 0–75)

## 2012-05-16 MED ORDER — LORAZEPAM 0.5 MG PO TABS: 0.5000 mg | ORAL_TABLET | Freq: Three times a day (TID) | ORAL | Status: DC | PRN

## 2012-05-16 NOTE — Telephone Encounter (Signed)
Received call from [place] at [facility] to let us know that the pt had a seizure while on the table for her MRI. They are unable to complete the MRI today. Per verbal from Provider, advised Ruth Bell to r/s MRI for Monday and have pt take 1 ativan 30 minutes prior to the MRI. Notified pt's husband.
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
## 2012-05-16 NOTE — Patient Instructions (Addendum)
Please complete blood work prior to leaving.  We will call Dr. Rondel Baton office to arrange a follow up appointment.  Complete your Ultrasound of your abdomen and MRI of your neck. Start ativan as needed to help with your anxiety and shaking.   Go to ER if you have recurrent seizure or if you have black/bloody stools, worsening abdominal pain. Follow up here in 1 week.

## 2012-05-17 DIAGNOSIS — M542 Cervicalgia: Secondary | ICD-10-CM | POA: Insufficient documentation

## 2012-05-17 DIAGNOSIS — R1011 Right upper quadrant pain: Secondary | ICD-10-CM | POA: Insufficient documentation

## 2012-05-17 LAB — PHENYTOIN LEVEL, TOTAL: Phenytoin Lvl: 16.2 ug/mL (ref 10.0–20.0)
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
## 2012-05-17 NOTE — Assessment & Plan Note (Addendum)
Lft, lipase NL. Await abdominal US. One episode hematemesis. Cbc NL. Obtain abd [name], cont ppi, refer to gi.

## 2012-05-17 NOTE — Assessment & Plan Note (Signed)
Ct neck neg. Pain continues. Obtain mri c spine.

## 2012-05-18 ENCOUNTER — Telehealth: Payer: Self-pay | Admitting: Family

## 2012-05-18 NOTE — Telephone Encounter (Signed)
Reviewed lab work.  Lab work is normal.

## 2012-05-19 NOTE — Telephone Encounter (Signed)
Called pt to follow up. Spoke with pt's husband. He reports that pt is "still having seizures."  Reports that pt is currently sleeping and unable to come to the phone.  I asked if she had seen Dr. Hyacinth Meeker or if she had follow up arranged. He said yes, I asked him when he said, "I don't know."  I asked him to have pt call me when she wakes up.    Called Dr. Rondel Baton office. Spoke to receptionist who told me that she does not have any upcoming apts.  She told me that she no showed 1/31 appointment. Prior to that she had multiple cancellations /no shows and "has not been seen in a long time." No showed multiple appointments.    I have asked them to arrange a follow up appointment with Dr. Hyacinth Meeker and they placed her on his schedule for Thursday March 20th, 9:30 AM.

## 2012-05-20 NOTE — Telephone Encounter (Signed)
Pt returned my call.  Reports 2 seizures last night.  No improvement with ativan.  Was unable to complete MRI of the neck due to shaking.  Neck still with bad pain.  Reviewed appointment below as well as labs/ultrasound with pt.  Advised her to be seen in ED due to recurrent seizure. She verbalizes understanding and is agreeable to go. She requested that I call her husband.  Spoke with husband advised him of the same and he verbalized understanding.

## 2012-05-26 ENCOUNTER — Ambulatory Visit (INDEPENDENT_AMBULATORY_CARE_PROVIDER_SITE_OTHER): Payer: 59 | Admitting: Gastroenterology

## 2012-05-26 ENCOUNTER — Emergency Department (HOSPITAL_COMMUNITY)
Admission: EM | Admit: 2012-05-26 | Discharge: 2012-05-27 | Disposition: A | Discharge status: Home / Self Care | Payer: 59 | Attending: Emergency Medicine | Admitting: Emergency Medicine

## 2012-05-26 ENCOUNTER — Encounter: Payer: Self-pay | Admitting: Gastroenterology

## 2012-05-26 VITALS — BP 140/94 | HR 100 | Ht 62.75 in | Wt 108.2 lb

## 2012-05-26 DIAGNOSIS — I10 Essential (primary) hypertension: Secondary | ICD-10-CM | POA: Insufficient documentation

## 2012-05-26 DIAGNOSIS — Z79899 Other long term (current) drug therapy: Secondary | ICD-10-CM | POA: Insufficient documentation

## 2012-05-26 DIAGNOSIS — R569 Unspecified convulsions: Secondary | ICD-10-CM

## 2012-05-26 DIAGNOSIS — F17200 Nicotine dependence, unspecified, uncomplicated: Secondary | ICD-10-CM | POA: Insufficient documentation

## 2012-05-26 DIAGNOSIS — Z862 Personal history of diseases of the blood and blood-forming organs and certain disorders involving the immune mechanism: Secondary | ICD-10-CM | POA: Insufficient documentation

## 2012-05-26 DIAGNOSIS — Z8719 Personal history of other diseases of the digestive system: Secondary | ICD-10-CM | POA: Insufficient documentation

## 2012-05-26 DIAGNOSIS — K920 Hematemesis: Secondary | ICD-10-CM

## 2012-05-26 DIAGNOSIS — F1210 Cannabis abuse, uncomplicated: Secondary | ICD-10-CM | POA: Insufficient documentation

## 2012-05-26 DIAGNOSIS — R1011 Right upper quadrant pain: Secondary | ICD-10-CM

## 2012-05-26 DIAGNOSIS — Z8742 Personal history of other diseases of the female genital tract: Secondary | ICD-10-CM | POA: Insufficient documentation

## 2012-05-26 DIAGNOSIS — G40909 Epilepsy, unspecified, not intractable, without status epilepticus: Secondary | ICD-10-CM | POA: Insufficient documentation

## 2012-05-26 DIAGNOSIS — Z8679 Personal history of other diseases of the circulatory system: Secondary | ICD-10-CM | POA: Insufficient documentation

## 2012-05-26 DIAGNOSIS — F329 Major depressive disorder, single episode, unspecified: Secondary | ICD-10-CM | POA: Insufficient documentation

## 2012-05-26 DIAGNOSIS — F3289 Other specified depressive episodes: Secondary | ICD-10-CM | POA: Insufficient documentation

## 2012-05-26 LAB — BASIC METABOLIC PANEL
BUN: 9 mg/dL (ref 6–23)
CO2: 28 mEq/L (ref 19–32)
Calcium: 9.2 mg/dL (ref 8.4–10.5)
Chloride: 106 mEq/L (ref 96–112)
Creatinine, Ser: 0.49 mg/dL — ABNORMAL LOW (ref 0.50–1.10)
GFR calc Af Amer: >90 mL/min (ref >90)
GFR calc non Af Amer: >90 mL/min (ref >90)
Glucose, Bld: 110 mg/dL — ABNORMAL HIGH (ref 70–99)
Potassium: 3.6 mEq/L (ref 3.5–5.1)
Sodium: 143 mEq/L (ref 135–145)

## 2012-05-26 LAB — URINALYSIS, ROUTINE W REFLEX MICROSCOPIC
Bilirubin Urine: NEGATIVE
Glucose, UA: NEGATIVE mg/dL
Ketones, ur: NEGATIVE mg/dL
Leukocytes, UA: NEGATIVE
Nitrite: NEGATIVE
Protein, ur: NEGATIVE mg/dL
Specific Gravity, Urine: 1.023 (ref 1.005–1.030)
Urobilinogen, UA: 0.2 mg/dL (ref 0.0–1.0)
pH: 8 (ref 5.0–8.0)

## 2012-05-26 LAB — URINE MICROSCOPIC-ADD ON

## 2012-05-26 LAB — RAPID URINE DRUG SCREEN, HOSP PERFORMED
Amphetamines: NOT DETECTED
Barbiturates: NOT DETECTED
Benzodiazepines: NOT DETECTED
Cocaine: NOT DETECTED
Opiates: NOT DETECTED
Tetrahydrocannabinol: POSITIVE — AB

## 2012-05-26 NOTE — Patient Instructions (Addendum)
You have been given a separate informational sheet regarding your tobacco use, the importance of quitting and local resources to help you quit. Please call back to schedule EGD. Stop ibuprofen and all ASA/NSAID products.  Cc: Sandford Craze, NP
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
## 2012-05-29 ENCOUNTER — Telehealth: Payer: Self-pay | Admitting: *Deleted

## 2012-05-29 NOTE — Telephone Encounter (Signed)
Ok to give 20 tabs then she should switch to tylenol.

## 2012-05-29 NOTE — Telephone Encounter (Signed)
Pt left message requesting refill of hydrocodone. Last Rx given 05/12/12 #15.  Please advise.

## 2012-05-30 MED ORDER — HYDROCODONE-ACETAMINOPHEN 5-325 MG PO TABS: 1.0000 | ORAL_TABLET | Freq: Four times a day (QID) | ORAL | Status: DC | PRN

## 2012-05-30 NOTE — Telephone Encounter (Signed)
Notified pt and she voices understanding. Rx left on pharmacy voicemail.

## 2012-06-05 ENCOUNTER — Encounter: Payer: Self-pay | Admitting: Internal Medicine

## 2012-06-05 NOTE — ED Provider Notes (Signed)
This is a late addendum to the encounter on May 26, 2012.  This was a shared encounter, by a mid-level provider.  I was available for consultation the duration of the patient's care.  Gerhard Munch, MD 06/05/12 1859

## 2012-06-11 ENCOUNTER — Telehealth: Payer: Self-pay | Admitting: Family

## 2012-06-11 DIAGNOSIS — M542 Cervicalgia: Secondary | ICD-10-CM

## 2012-06-11 DIAGNOSIS — M549 Dorsalgia, unspecified: Secondary | ICD-10-CM

## 2012-06-11 NOTE — Telephone Encounter (Signed)
Patient states that she would like a referral to physical therapy for her neck and back pain

## 2012-06-12 ENCOUNTER — Encounter: Payer: Self-pay | Admitting: Gastroenterology

## 2012-06-16 ENCOUNTER — Ambulatory Visit: Payer: 59 | Attending: Family | Admitting: Rehabilitation

## 2012-06-16 DIAGNOSIS — M542 Cervicalgia: Secondary | ICD-10-CM | POA: Insufficient documentation

## 2012-06-16 DIAGNOSIS — M545 Low back pain, unspecified: Secondary | ICD-10-CM | POA: Insufficient documentation

## 2012-06-16 DIAGNOSIS — IMO0001 Reserved for inherently not codable concepts without codable children: Secondary | ICD-10-CM | POA: Insufficient documentation

## 2012-06-16 DIAGNOSIS — M546 Pain in thoracic spine: Secondary | ICD-10-CM | POA: Insufficient documentation

## 2012-06-19 ENCOUNTER — Telehealth: Payer: Self-pay | Admitting: *Deleted

## 2012-06-19 NOTE — Telephone Encounter (Signed)
I would recommend short course of ibuprofen or aleve.  Then return to tylenol.

## 2012-06-19 NOTE — Telephone Encounter (Signed)
Received message from pt requesting medication for pain. In 05/29/12 phone note pt was advised to change from hydrocodone to tylenol.  Pt first stated she is not taking anything for the pain. I reminded her of 05/29/12 phone call and she states that she did try the tylenol but they do not help her lower back pain. Has sharp stabbing pains. She did take one of her sisters muscle relaxers and it seemed to help. She doesn't know the name of the muscle relaxer.  Also wants to know if she can try excedrine for her back pain. Please advise.

## 2012-06-19 NOTE — Telephone Encounter (Signed)
Notified pt. 

## 2012-06-23 ENCOUNTER — Encounter: Payer: Self-pay | Admitting: Family

## 2012-06-23 ENCOUNTER — Ambulatory Visit: Payer: 59 | Attending: Family | Admitting: Rehabilitation

## 2012-06-23 DIAGNOSIS — IMO0001 Reserved for inherently not codable concepts without codable children: Secondary | ICD-10-CM | POA: Insufficient documentation

## 2012-06-23 DIAGNOSIS — M545 Low back pain, unspecified: Secondary | ICD-10-CM | POA: Insufficient documentation

## 2012-06-23 DIAGNOSIS — M546 Pain in thoracic spine: Secondary | ICD-10-CM | POA: Insufficient documentation

## 2012-06-23 DIAGNOSIS — M542 Cervicalgia: Secondary | ICD-10-CM | POA: Insufficient documentation

## 2012-06-26 ENCOUNTER — Ambulatory Visit: Payer: 59 | Admitting: Rehabilitation

## 2012-06-30 ENCOUNTER — Ambulatory Visit: Payer: 59 | Admitting: Rehabilitation

## 2012-07-02 ENCOUNTER — Ambulatory Visit (HOSPITAL_BASED_OUTPATIENT_CLINIC_OR_DEPARTMENT_OTHER)
Admission: RE | Admit: 2012-07-02 | Discharge: 2012-07-02 | Disposition: A | Discharge status: Home / Self Care | Payer: 59 | Source: Ambulatory Visit | Attending: Family | Admitting: Family

## 2012-07-02 ENCOUNTER — Telehealth: Payer: Self-pay | Admitting: Family

## 2012-07-02 ENCOUNTER — Encounter: Payer: Self-pay | Admitting: Family

## 2012-07-02 ENCOUNTER — Ambulatory Visit (INDEPENDENT_AMBULATORY_CARE_PROVIDER_SITE_OTHER): Payer: 59 | Admitting: Family

## 2012-07-02 VITALS — BP 116/88 | HR 86 | Temp 98.0°F | Resp 16 | Ht 62.0 in | Wt 107.0 lb

## 2012-07-02 DIAGNOSIS — R209 Unspecified disturbances of skin sensation: Secondary | ICD-10-CM | POA: Insufficient documentation

## 2012-07-02 DIAGNOSIS — R29898 Other symptoms and signs involving the musculoskeletal system: Secondary | ICD-10-CM

## 2012-07-02 DIAGNOSIS — Z5181 Encounter for therapeutic drug level monitoring: Secondary | ICD-10-CM

## 2012-07-02 DIAGNOSIS — R5381 Other malaise: Secondary | ICD-10-CM | POA: Insufficient documentation

## 2012-07-02 DIAGNOSIS — G40909 Epilepsy, unspecified, not intractable, without status epilepticus: Secondary | ICD-10-CM

## 2012-07-02 DIAGNOSIS — R569 Unspecified convulsions: Secondary | ICD-10-CM | POA: Insufficient documentation

## 2012-07-02 DIAGNOSIS — R55 Syncope and collapse: Secondary | ICD-10-CM | POA: Insufficient documentation

## 2012-07-02 MED ORDER — GABAPENTIN 600 MG PO TABS: 600.0000 mg | ORAL_TABLET | Freq: Three times a day (TID) | ORAL | Status: DC

## 2012-07-02 MED ORDER — METOPROLOL TARTRATE 25 MG PO TABS: 25.0000 mg | ORAL_TABLET | Freq: Two times a day (BID) | ORAL | Status: DC

## 2012-07-02 MED ORDER — PANTOPRAZOLE SODIUM 40 MG PO TBEC: 40.0000 mg | DELAYED_RELEASE_TABLET | Freq: Every day | ORAL | Status: DC
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
## 2012-07-02 NOTE — Patient Instructions (Addendum)
Please complete your lab work prior to going down stairs for your CT and X rays. Arrange an earlier follow up with Dr. Hyacinth Meeker. Follow up in 1 week.

## 2012-07-03 ENCOUNTER — Telehealth: Payer: Self-pay | Admitting: Family

## 2012-07-03 ENCOUNTER — Encounter: Payer: 59 | Admitting: Rehabilitation

## 2012-07-03 DIAGNOSIS — R29898 Other symptoms and signs involving the musculoskeletal system: Secondary | ICD-10-CM | POA: Insufficient documentation

## 2012-07-03 LAB — PHENYTOIN LEVEL, TOTAL: Phenytoin Lvl: <0.5 ug/mL — ABNORMAL LOW (ref 10.0–20.0)

## 2012-07-03 NOTE — Telephone Encounter (Signed)
Pls call pt and let her know that CT head and x ray of lower back are both normal.  I reviewed her case with Dr. Hyacinth Meeker (her neurologist) and he would like to see her back in his office next week.  His office will be contacting her with this appointment.  Also, dilantin level is undetectable. I do not think she is getting this medication.  ? If family is confused- they prepare her meds.  She needs to make sure she is taking this medication.

## 2012-07-03 NOTE — Assessment & Plan Note (Addendum)
New. Weakness is profound.  Obtained CT head which is negative.  Plain film of the lumbar spine is normal. She had an MRI of the lumbar spine 11/12 which was normal.  Case reviewed with Dr. Abner Greenspan.  Will obtain MRI lumbar spine.  Case also reviewed with Dr. Hyacinth Meeker- pt's neurologist who will arrange follow up in his office next week.

## 2012-07-03 NOTE — Assessment & Plan Note (Addendum)
Uncontrolled. Dilantin level undetectable.  Reinforced careful compliance with meds. See phone note.   Arrange follow up with Dr. Hyacinth Meeker.

## 2012-07-03 NOTE — Telephone Encounter (Signed)
Metoprolol tartrate Rx from 07/02/12 printed. Rx was called verbally to [facility] at [facility] today per Rx, #60 x 3 refills.

## 2012-07-03 NOTE — Telephone Encounter (Signed)
Notified pt. She reports that she is preparing her own medication. Advised her to have another family memember (son, husband) help manage medications to ensure she is getting her medication as prescribed as this can be causing her to have more seizures. She voices understanding.

## 2012-07-06 ENCOUNTER — Telehealth: Payer: Self-pay | Admitting: Family

## 2012-07-06 DIAGNOSIS — R29898 Other symptoms and signs involving the musculoskeletal system: Secondary | ICD-10-CM

## 2012-07-06 NOTE — Telephone Encounter (Signed)
Pls call pt and let her know that I have placed order for MRI of the lumbar spine.  We will try to arrange early this week.  If she has bowel or bladder incontinence, she should go to the ER. Please let us know if she does not hear back from  Neuro office early this week RE: appointment with Dr. Hyacinth Meeker.

## 2012-07-06 NOTE — Telephone Encounter (Signed)
Message copied by Sandford Craze on Sun Jul 06, 2012  9:08 AM ------      Message from: Danise Edge A      Created: Thu Jul 03, 2012 12:59 PM       So I think given the low Dilantin level would not do a medrol dosepak because it can lower the seizure threshold. Would be reasonable to order a new MRI of low back. And let her know that if she gets worse or has incontinence should go to ER for Eval til we can get that done and she sees Neuro            SAB      ----- Message -----         From: Sandford Craze, NP         Sent: 07/03/2012   8:21 AM           To: Bradd Canary, MD            Good morning-            This one kept me up last night. Please see my note from yesterday.  Going to try to get in touch with her neurologist.  Do you think we should do MRI of the brain and/or repeat MRI of the Lspine.  She has big psych hx and hx of pseudoseizures- but I have to assume this is something real until proven otherwise.  What do you think about a medrol dose pak?            Thanks!       ------

## 2012-07-07 ENCOUNTER — Encounter: Payer: 59 | Admitting: Rehabilitation

## 2012-07-07 NOTE — Telephone Encounter (Signed)
Notified pt and she is agreeable to proceed with MRI. Pt has appt with Dr Hyacinth Meeker this Thursday.

## 2012-07-09 ENCOUNTER — Ambulatory Visit
Admission: RE | Admit: 2012-07-09 | Discharge: 2012-07-09 | Disposition: A | Discharge status: Home / Self Care | Payer: 59 | Source: Ambulatory Visit | Attending: Family | Admitting: Family

## 2012-07-09 ENCOUNTER — Encounter: Payer: Self-pay | Admitting: Gastroenterology

## 2012-07-09 DIAGNOSIS — R29898 Other symptoms and signs involving the musculoskeletal system: Secondary | ICD-10-CM

## 2012-07-10 ENCOUNTER — Encounter: Payer: 59 | Admitting: Rehabilitation

## 2012-07-14 ENCOUNTER — Encounter: Payer: 59 | Admitting: Rehabilitation

## 2012-07-17 ENCOUNTER — Encounter: Payer: 59 | Admitting: Rehabilitation

## 2012-07-30 ENCOUNTER — Ambulatory Visit: Payer: 59 | Admitting: Family

## 2012-08-05 ENCOUNTER — Ambulatory Visit (AMBULATORY_SURGERY_CENTER): Payer: 59

## 2012-08-05 VITALS — Ht 62.0 in | Wt 112.6 lb

## 2012-08-05 DIAGNOSIS — Z1211 Encounter for screening for malignant neoplasm of colon: Secondary | ICD-10-CM

## 2012-08-05 DIAGNOSIS — Z800 Family history of malignant neoplasm of digestive organs: Secondary | ICD-10-CM

## 2012-08-05 MED ORDER — MOVIPREP 100 G PO SOLR: ORAL | Status: DC

## 2012-08-06 ENCOUNTER — Encounter: Payer: Self-pay | Admitting: Gastroenterology

## 2012-08-19 ENCOUNTER — Encounter: Payer: 59 | Admitting: Gastroenterology

## 2012-08-20 ENCOUNTER — Ambulatory Visit: Payer: Self-pay | Admitting: Family

## 2012-08-20 DIAGNOSIS — Z0289 Encounter for other administrative examinations: Secondary | ICD-10-CM

## 2012-08-21 ENCOUNTER — Ambulatory Visit (AMBULATORY_SURGERY_CENTER): Payer: 59 | Admitting: Gastroenterology

## 2012-08-21 ENCOUNTER — Encounter: Payer: Self-pay | Admitting: Gastroenterology

## 2012-08-21 VITALS — BP 138/95 | HR 66 | Temp 98.5°F | Resp 19 | Ht 62.0 in | Wt 112.0 lb

## 2012-08-21 DIAGNOSIS — D126 Benign neoplasm of colon, unspecified: Secondary | ICD-10-CM

## 2012-08-21 DIAGNOSIS — Z86010 Personal history of colonic polyps: Secondary | ICD-10-CM

## 2012-08-21 DIAGNOSIS — K5289 Other specified noninfective gastroenteritis and colitis: Secondary | ICD-10-CM

## 2012-08-21 DIAGNOSIS — Z1211 Encounter for screening for malignant neoplasm of colon: Secondary | ICD-10-CM

## 2012-08-21 MED ORDER — SODIUM CHLORIDE 0.9 % IV SOLN: 500.0000 mL | INTRAVENOUS | Status: DC

## 2012-08-21 NOTE — Patient Instructions (Addendum)

## 2012-08-21 NOTE — Progress Notes (Signed)
Called to room to assist during endoscopic procedure.  Patient ID and intended procedure confirmed with present staff. Received instructions for my participation in the procedure from the performing physician.  

## 2012-08-21 NOTE — Progress Notes (Signed)
Report to pacu rn, vss, bbs=clear 

## 2012-08-21 NOTE — Op Note (Signed)
[facility] [address] [place], [address]   COLONOSCOPY PROCEDURE REPORT  PATIENT: Ruth Bell, Ruth Bell.  MR#: [id number] BIRTHDATE: 1960/11/23 , 51  yrs. old GENDER: Female ENDOSCOPIST: Meryl Dare, MD, [facility] PROCEDURE DATE:  08/21/2012 PROCEDURE:   Colonoscopy with biopsy and snare polypectomy ASA CLASS:   Class II INDICATIONS:Patient's personal history of adenomatous colon polyps and piecemeal polypectomy, assess completeness. MEDICATIONS: MAC sedation, administered by CRNA and propofol (Diprivan) 300mg  IV DESCRIPTION OF PROCEDURE:   After the risks benefits and alternatives of the procedure were thoroughly explained, informed consent was obtained.  A digital rectal exam revealed no abnormalities of the rectum.   The LB [id number]  endoscope was introduced through the anus and advanced to the cecum, which was identified by both the appendix and ileocecal valve. No adverse events experienced.   The quality of the prep was adequate, using MoviPrep  The instrument was then slowly withdrawn as the colon was fully examined.  COLON FINDINGS: A sessile polyp measuring 5 mm in size was found in the proximal transverse colon. A polypectomy was performed with a cold snare.  The resection was complete and the polyp tissue was completely retrieved.  A sessile polyp measuring 7 mm in size was found in the sigmoid colon.  A polypectomy was performed with a cold snare.  The resection was complete and the polyp tissue was completely retrieved.  Appediceal stump, measuring 8-9 mm, at the appendicial orifice.  Multiple biopsies obtained.   Tattoo in distal transverse colon without residual polyp tissue. The colon was otherwise normal. There was no diverticulosis, inflammation, polyps or cancers unless previously stated.  Retroflexed views revealed no abnormalities. The time to cecum=3 minutes 17 seconds. Withdrawal time=13 minutes 51 seconds.  The scope was withdrawn  and the procedure completed.  COMPLICATIONS: There were no complications.  ENDOSCOPIC IMPRESSION: 1.   Sessile polyp measuring 5 mm in the proximal transverse colon; polypectomy performed with a cold snare 2.   Sessile polyp measuring 7 mm in the sigmoid colon; polypectomy performed with a cold snare 3.   Appediceal stump, measuring 8-9 mm.  Multiple biopsies obtained. 4.   Tattoo in distal transverse colon without residual polyp tissue.  RECOMMENDATIONS: 1.  Await pathology results 2.  Repeat Colonoscopy in 3 years.  eSigned:  Meryl Dare, MD, [facility] 08/21/2012 2:04 PM

## 2012-08-21 NOTE — Progress Notes (Signed)
Patient did not experience any of the following events: a burn prior to discharge; a fall within the facility; wrong site/side/patient/procedure/implant event; or a hospital transfer or hospital admission upon discharge from the facility. (G8907) Patient did not have preoperative order for IV antibiotic SSI prophylaxis. (G8918)  

## 2012-08-22 ENCOUNTER — Telehealth: Payer: Self-pay | Admitting: *Deleted

## 2012-08-22 NOTE — Telephone Encounter (Signed)
  Follow up Call-  Call back number 08/21/2012 06/05/2011  Post procedure Call Back phone  # [phone]  Permission to leave phone message Yes Yes     Patient questions:  Do you have a fever, pain , or abdominal swelling? no Pain Score  0 *  Have you tolerated food without any problems? yes  Have you been able to return to your normal activities? yes  Do you have any questions about your discharge instructions: Diet   no Medications  no Follow up visit  no  Do you have questions or concerns about your Care? no  Actions: * If pain score is 4 or above: No action needed, pain <4.

## 2012-08-28 ENCOUNTER — Other Ambulatory Visit: Payer: Self-pay | Admitting: Family

## 2012-08-31 ENCOUNTER — Encounter: Payer: Self-pay | Admitting: Gastroenterology

## 2013-03-23 ENCOUNTER — Telehealth: Payer: Self-pay | Admitting: Family

## 2013-03-23 NOTE — Telephone Encounter (Signed)
OK 

## 2013-03-23 NOTE — Telephone Encounter (Signed)
Scheduled patient appointment with [facility] tomorrow for acute visit but she is wanting to transfer to Dr. Charlett Blake because her husband also is under Dr. Charlett Blake care. Also, scheduled new patient appointment with Dr. Charlett Blake in march.

## 2013-03-23 NOTE — Telephone Encounter (Signed)
OK with me.

## 2013-03-24 ENCOUNTER — Ambulatory Visit (INDEPENDENT_AMBULATORY_CARE_PROVIDER_SITE_OTHER): Payer: 59 | Admitting: Family

## 2013-03-24 ENCOUNTER — Encounter: Payer: Self-pay | Admitting: Family

## 2013-03-24 VITALS — BP 130/80 | HR 70 | Resp 16 | Ht 62.0 in | Wt 102.0 lb

## 2013-03-24 DIAGNOSIS — F419 Anxiety disorder, unspecified: Secondary | ICD-10-CM

## 2013-03-24 DIAGNOSIS — R6884 Jaw pain: Secondary | ICD-10-CM

## 2013-03-24 DIAGNOSIS — F341 Dysthymic disorder: Secondary | ICD-10-CM

## 2013-03-24 DIAGNOSIS — F32A Depression, unspecified: Secondary | ICD-10-CM

## 2013-03-24 DIAGNOSIS — F329 Major depressive disorder, single episode, unspecified: Secondary | ICD-10-CM

## 2013-03-24 DIAGNOSIS — G40909 Epilepsy, unspecified, not intractable, without status epilepticus: Secondary | ICD-10-CM

## 2013-03-24 MED ORDER — PHENYTOIN SODIUM EXTENDED 100 MG PO CAPS: 200.0000 mg | ORAL_CAPSULE | Freq: Every day | ORAL | Status: DC

## 2013-03-24 MED ORDER — LORAZEPAM 0.5 MG PO TABS: 0.5000 mg | ORAL_TABLET | Freq: Three times a day (TID) | ORAL | Status: DC | PRN

## 2013-03-24 MED ORDER — CYCLOBENZAPRINE HCL 5 MG PO TABS: ORAL_TABLET | ORAL | Status: DC

## 2013-03-24 MED ORDER — MELOXICAM 7.5 MG PO TABS: 7.5000 mg | ORAL_TABLET | Freq: Every day | ORAL | Status: DC

## 2013-03-24 MED ORDER — CITALOPRAM HYDROBROMIDE 20 MG PO TABS: 20.0000 mg | ORAL_TABLET | Freq: Every day | ORAL | Status: DC

## 2013-03-24 MED ORDER — GABAPENTIN 600 MG PO TABS: 600.0000 mg | ORAL_TABLET | Freq: Three times a day (TID) | ORAL | Status: DC

## 2013-03-24 NOTE — Patient Instructions (Addendum)
Restart citalopram 20mg - 1/2 tab once daily for 1 week, then increase to a full tab once daily on week two.  You will be contacted about your referral to the oral surgeon to evaluate your jaw. Make sure you are staying well hydrated and drinking ensure three times a day until you can resume a normal diet. Follow up in 1 month so we can see how you are doing on citalopram.

## 2013-03-24 NOTE — Progress Notes (Signed)
Pre visit review using our clinic review tool, if applicable. No additional management support is needed unless otherwise documented below in the visit note. 
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
## 2013-03-26 DIAGNOSIS — R6884 Jaw pain: Secondary | ICD-10-CM | POA: Insufficient documentation

## 2013-03-26 NOTE — Assessment & Plan Note (Signed)
Resume dilantin and gabapentin.

## 2013-03-26 NOTE — Assessment & Plan Note (Signed)
Deteriorated, resume celexa.

## 2013-03-26 NOTE — Assessment & Plan Note (Signed)
?   TMJ dislocation. Rx with meloxicam, flexeril, refer to oral surgeon.

## 2013-04-27 ENCOUNTER — Ambulatory Visit: Payer: 59 | Admitting: Family

## 2013-05-18 ENCOUNTER — Encounter: Payer: Self-pay | Admitting: Family

## 2013-05-18 ENCOUNTER — Ambulatory Visit (INDEPENDENT_AMBULATORY_CARE_PROVIDER_SITE_OTHER): Payer: 59 | Admitting: Family

## 2013-05-18 VITALS — BP 138/90 | HR 88 | Temp 98.5°F | Resp 16 | Ht 62.0 in | Wt 111.1 lb

## 2013-05-18 DIAGNOSIS — R6884 Jaw pain: Secondary | ICD-10-CM

## 2013-05-18 DIAGNOSIS — F419 Anxiety disorder, unspecified: Secondary | ICD-10-CM

## 2013-05-18 DIAGNOSIS — F341 Dysthymic disorder: Secondary | ICD-10-CM

## 2013-05-18 DIAGNOSIS — F329 Major depressive disorder, single episode, unspecified: Secondary | ICD-10-CM

## 2013-05-18 DIAGNOSIS — Z5181 Encounter for therapeutic drug level monitoring: Secondary | ICD-10-CM

## 2013-05-18 DIAGNOSIS — F32A Depression, unspecified: Secondary | ICD-10-CM

## 2013-05-18 DIAGNOSIS — G40909 Epilepsy, unspecified, not intractable, without status epilepticus: Secondary | ICD-10-CM

## 2013-05-18 NOTE — Progress Notes (Signed)
Subjective:    Patient ID: Ruth Bell, female    DOB: 1960-05-26, 53 y.o.   MRN: [id number]  HPI  [name] is a 53 yr old female who presents today for follow up of her jaw pain. She was prescribed meloxicam and flexeril and was referred to an oral surgeon. She reports that she was not evaluated by the oral surgeon because  "nobody every called me."  Our records show that an appointment was made but patient did not keep her appointment. She reports that her jaw pain is resolved.  Still has a clicking when she opens her mouth wide.    Anxiety/depression- last visit celexa was resumed. Reports that her mood is much more improved.   Seizure disorder-  Denies seizures since her last visit.  Reports that she ran out of dilantin and celexa and just restarted.    Review of Systems See HPI  Past Medical History  Diagnosis Date  . Seizures     history of pseudoseizure disorder  . Migraine, unspecified, without mention of intractable migraine without mention of status migrainosus   . Anemia   . Menometrorrhagia 2006  . Uterine leiomyoma 2006  . Depression   . Hypertension   . Tubular adenoma of colon 05/2011    History   Social History  . Marital Status: Married    Spouse Name: Ruth Bell    Number of Children: N/A  . Years of Education: N/A   Occupational History  . CMA    Social History Main Topics  . Smoking status: Former Smoker -- 0.50 packs/day for 40 years    Types: Cigarettes    Start date: 03/11/2013  . Smokeless tobacco: Never Used  . Alcohol Use: No  . Drug Use: No  . Sexual Activity: Yes    Birth Control/ Protection: None   Other Topics Concern  . Not on file   Social History Narrative  . No narrative on file    Past Surgical History  Procedure Laterality Date  . Appendectomy    . Bilateral salpingoophorectomy  04/06/04  . Abdominal hysterectomy  04/06/04  . Rotator cuff repair      Family History  Problem Relation Age of Onset  . Diabetes Other     . Hypertension Other   . Heart disease Mother   . Colon cancer Father     No Known Allergies  Current Outpatient Prescriptions on File Prior to Visit  Medication Sig Dispense Refill  . citalopram (CELEXA) 20 MG tablet Take 1 tablet (20 mg total) by mouth daily.  30 tablet  3  . cyclobenzaprine (FLEXERIL) 5 MG tablet One tablet by mouth 3 times daily for jaw pain- may cause drowsiness.  30 tablet  0  . gabapentin (NEURONTIN) 600 MG tablet Take 1 tablet (600 mg total) by mouth 3 (three) times daily.  90 tablet  2  . LORazepam (ATIVAN) 0.5 MG tablet Take 1 tablet (0.5 mg total) by mouth every 8 (eight) hours as needed for anxiety.  30 tablet  0  . meloxicam (MOBIC) 7.5 MG tablet Take 1 tablet (7.5 mg total) by mouth daily.  14 tablet  0  . Multiple Vitamin (MULITIVITAMIN WITH MINERALS) TABS Take 1 tablet by mouth daily.      . phenytoin (DILANTIN) 100 MG ER capsule Take 2 capsules (200 mg total) by mouth daily.  180 capsule  0  . albuterol (PROVENTIL HFA;VENTOLIN HFA) 108 (90 BASE) MCG/ACT inhaler Inhale 2 puffs into the  lungs every 6 (six) hours as needed for wheezing.  1 Inhaler  2   No current facility-administered medications on file prior to visit.    BP 138/90  Pulse 88  Temp(Src) 98.5 F (36.9 C) (Oral)  Resp 16  Ht 5\' 2"  (1.575 m)  Wt 111 lb 1.3 oz (50.386 kg)  BMI 20.31 kg/m2  SpO2 100%       Objective:   Physical Exam  Constitutional: She is oriented to person, place, and time. She appears well-developed and well-nourished. No distress.  HENT:  Head: Normocephalic and atraumatic.  Cardiovascular: Normal rate and regular rhythm.   No murmur heard. Pulmonary/Chest: Effort normal and breath sounds normal. No respiratory distress. She has no wheezes. She has no rales. She exhibits no tenderness.  Neurological: She is alert and oriented to person, place, and time.  Skin: Skin is warm and dry.  Psychiatric: She has a normal mood and affect. Her behavior is normal.  Judgment and thought content normal.          Assessment & Plan:

## 2013-05-18 NOTE — Patient Instructions (Addendum)
Please complete lab work prior to leaving.  Please follow up in 3 months.

## 2013-05-19 LAB — HEPATIC FUNCTION PANEL
ALT: 10 U/L (ref 0–35)
AST: 14 U/L (ref 0–37)
Albumin: 4.4 g/dL (ref 3.5–5.2)
Alkaline Phosphatase: 59 U/L (ref 39–117)
Bilirubin, Direct: 0.1 mg/dL (ref 0.0–0.3)
Indirect Bilirubin: 0.4 mg/dL (ref 0.2–1.2)
Total Bilirubin: 0.5 mg/dL (ref 0.2–1.2)
Total Protein: 6.9 g/dL (ref 6.0–8.3)

## 2013-05-19 LAB — PHENYTOIN LEVEL, TOTAL: Phenytoin Lvl: 0.5 ug/mL — ABNORMAL LOW (ref 10.0–20.0)

## 2013-05-20 NOTE — Assessment & Plan Note (Signed)
Improved, continue citalopram.  

## 2013-05-20 NOTE — Assessment & Plan Note (Signed)
Resolved. Monitor.  

## 2013-05-20 NOTE — Assessment & Plan Note (Signed)
Stable, dilantin level sub therapeutic but she just resumed. Continue dilantin, check follow up level next visit.

## 2013-05-26 ENCOUNTER — Ambulatory Visit: Payer: 59 | Admitting: Family Medicine

## 2013-08-04 ENCOUNTER — Ambulatory Visit: Payer: 59 | Admitting: Family Medicine

## 2013-09-23 ENCOUNTER — Ambulatory Visit (INDEPENDENT_AMBULATORY_CARE_PROVIDER_SITE_OTHER): Payer: 59 | Admitting: Family

## 2013-09-23 ENCOUNTER — Encounter: Payer: Self-pay | Admitting: Family

## 2013-09-23 VITALS — BP 170/100 | HR 58 | Temp 98.2°F | Resp 16 | Ht 62.0 in | Wt 104.0 lb

## 2013-09-23 DIAGNOSIS — M25519 Pain in unspecified shoulder: Secondary | ICD-10-CM

## 2013-09-23 DIAGNOSIS — R209 Unspecified disturbances of skin sensation: Secondary | ICD-10-CM

## 2013-09-23 DIAGNOSIS — M25511 Pain in right shoulder: Secondary | ICD-10-CM

## 2013-09-23 DIAGNOSIS — R112 Nausea with vomiting, unspecified: Secondary | ICD-10-CM

## 2013-09-23 DIAGNOSIS — M5412 Radiculopathy, cervical region: Secondary | ICD-10-CM

## 2013-09-23 DIAGNOSIS — R202 Paresthesia of skin: Secondary | ICD-10-CM

## 2013-09-23 DIAGNOSIS — R200 Anesthesia of skin: Secondary | ICD-10-CM

## 2013-09-23 DIAGNOSIS — R110 Nausea: Secondary | ICD-10-CM

## 2013-09-23 LAB — CBC WITH DIFFERENTIAL/PLATELET
Basophils Absolute: 0 10*3/uL (ref 0.0–0.1)
Basophils Relative: 0 % (ref 0–1)
Eosinophils Absolute: 0.1 10*3/uL (ref 0.0–0.7)
Eosinophils Relative: 1 % (ref 0–5)
HCT: 41.3 % (ref 36.0–46.0)
Hemoglobin: 14.2 g/dL (ref 12.0–15.0)
Lymphocytes Relative: 42 % (ref 12–46)
Lymphs Abs: 3.8 10*3/uL (ref 0.7–4.0)
MCH: 27.9 pg (ref 26.0–34.0)
MCHC: 34.4 g/dL (ref 30.0–36.0)
MCV: 81.1 fL (ref 78.0–100.0)
Monocytes Absolute: 0.5 10*3/uL (ref 0.1–1.0)
Monocytes Relative: 5 % (ref 3–12)
Neutro Abs: 4.7 10*3/uL (ref 1.7–7.7)
Neutrophils Relative %: 52 % (ref 43–77)
Platelets: 304 10*3/uL (ref 150–400)
RBC: 5.09 MIL/uL (ref 3.87–5.11)
RDW: 15.1 % (ref 11.5–15.5)
WBC: 9 10*3/uL (ref 4.0–10.5)

## 2013-09-23 LAB — HEPATIC FUNCTION PANEL
ALT: 16 U/L (ref 0–35)
AST: 18 U/L (ref 0–37)
Albumin: 4.6 g/dL (ref 3.5–5.2)
Alkaline Phosphatase: 61 U/L (ref 39–117)
Bilirubin, Direct: 0.1 mg/dL (ref 0.0–0.3)
Indirect Bilirubin: 0.3 mg/dL (ref 0.2–1.2)
Total Bilirubin: 0.4 mg/dL (ref 0.2–1.2)
Total Protein: 7.3 g/dL (ref 6.0–8.3)

## 2013-09-23 LAB — LIPASE: Lipase: 34 U/L (ref 0–75)

## 2013-09-23 MED ORDER — PHENYTOIN SODIUM EXTENDED 100 MG PO CAPS: 200.0000 mg | ORAL_CAPSULE | Freq: Every day | ORAL | Status: DC

## 2013-09-23 MED ORDER — PANTOPRAZOLE SODIUM 40 MG PO TBEC: 40.0000 mg | DELAYED_RELEASE_TABLET | Freq: Every day | ORAL | Status: DC

## 2013-09-23 MED ORDER — CYCLOBENZAPRINE HCL 5 MG PO TABS: ORAL_TABLET | ORAL | Status: DC

## 2013-09-23 MED ORDER — METHYLPREDNISOLONE 4 MG PO KIT: PACK | ORAL | Status: DC

## 2013-09-23 MED ORDER — ONDANSETRON HCL 4 MG PO TABS
4.0000 mg | ORAL_TABLET | Freq: Three times a day (TID) | ORAL | Status: DC | PRN
Start: 2013-09-23 — End: 2013-10-13

## 2013-09-23 NOTE — Assessment & Plan Note (Signed)
Suspect rotator cuff involvement. Refer to Sports med for further evaluation.
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
## 2013-09-23 NOTE — Patient Instructions (Signed)
Restart metoprolol for blood pressure.  Start medrol dose pak and flexeril for neck/arm pain. Restart protonix for your stomach. You may use zofran as needed for nausea. Call if your nausea worsens, or if it does not improve in 1 week. You will be contacted about your referral to meet with Sports medicine and about your MRI of your neck.  Please let us know if you have not heard back within 1 week about your referral. Follow up with [name] in 1 week.

## 2013-09-23 NOTE — Assessment & Plan Note (Addendum)
Obtain MRI of C-spine to rule out cervical pathology. Rx provided for medrol dose pak for swelling and pain. Flexeril prn. Advised not to drive after taking.

## 2013-09-23 NOTE — Assessment & Plan Note (Signed)
She admits to not taking PPI. Resume PPI, obtain CBC, LFT, lipase, add prn zofran, call if symptoms worsen or if symptoms do not improve.

## 2013-09-23 NOTE — Progress Notes (Signed)
Pre visit review using our clinic review tool, if applicable. No additional management support is needed unless otherwise documented below in the visit note. 

## 2013-09-24 ENCOUNTER — Encounter: Payer: Self-pay | Admitting: Family

## 2013-09-25 ENCOUNTER — Emergency Department (HOSPITAL_COMMUNITY)
Admission: EM | Admit: 2013-09-25 | Discharge: 2013-09-25 | Disposition: A | Discharge status: Home / Self Care | Payer: 59 | Attending: Emergency Medicine | Admitting: Emergency Medicine

## 2013-09-25 ENCOUNTER — Emergency Department (HOSPITAL_COMMUNITY): Payer: 59

## 2013-09-25 ENCOUNTER — Telehealth: Payer: Self-pay | Admitting: Family

## 2013-09-25 ENCOUNTER — Encounter (HOSPITAL_COMMUNITY): Payer: Self-pay | Admitting: Emergency Medicine

## 2013-09-25 DIAGNOSIS — IMO0002 Reserved for concepts with insufficient information to code with codable children: Secondary | ICD-10-CM | POA: Insufficient documentation

## 2013-09-25 DIAGNOSIS — Z862 Personal history of diseases of the blood and blood-forming organs and certain disorders involving the immune mechanism: Secondary | ICD-10-CM | POA: Insufficient documentation

## 2013-09-25 DIAGNOSIS — F17200 Nicotine dependence, unspecified, uncomplicated: Secondary | ICD-10-CM | POA: Insufficient documentation

## 2013-09-25 DIAGNOSIS — F329 Major depressive disorder, single episode, unspecified: Secondary | ICD-10-CM | POA: Insufficient documentation

## 2013-09-25 DIAGNOSIS — Z860100 Personal history of colon polyps, unspecified: Secondary | ICD-10-CM | POA: Insufficient documentation

## 2013-09-25 DIAGNOSIS — G8929 Other chronic pain: Secondary | ICD-10-CM | POA: Insufficient documentation

## 2013-09-25 DIAGNOSIS — R55 Syncope and collapse: Secondary | ICD-10-CM | POA: Insufficient documentation

## 2013-09-25 DIAGNOSIS — I10 Essential (primary) hypertension: Secondary | ICD-10-CM | POA: Insufficient documentation

## 2013-09-25 DIAGNOSIS — M542 Cervicalgia: Secondary | ICD-10-CM | POA: Insufficient documentation

## 2013-09-25 DIAGNOSIS — M25519 Pain in unspecified shoulder: Secondary | ICD-10-CM | POA: Insufficient documentation

## 2013-09-25 DIAGNOSIS — R1110 Vomiting, unspecified: Secondary | ICD-10-CM | POA: Insufficient documentation

## 2013-09-25 DIAGNOSIS — Z9119 Patient's noncompliance with other medical treatment and regimen: Secondary | ICD-10-CM | POA: Insufficient documentation

## 2013-09-25 DIAGNOSIS — Z79899 Other long term (current) drug therapy: Secondary | ICD-10-CM | POA: Insufficient documentation

## 2013-09-25 DIAGNOSIS — G40909 Epilepsy, unspecified, not intractable, without status epilepticus: Secondary | ICD-10-CM | POA: Insufficient documentation

## 2013-09-25 DIAGNOSIS — Z8742 Personal history of other diseases of the female genital tract: Secondary | ICD-10-CM | POA: Insufficient documentation

## 2013-09-25 DIAGNOSIS — F3289 Other specified depressive episodes: Secondary | ICD-10-CM | POA: Insufficient documentation

## 2013-09-25 DIAGNOSIS — Z91199 Patient's noncompliance with other medical treatment and regimen due to unspecified reason: Secondary | ICD-10-CM | POA: Insufficient documentation

## 2013-09-25 LAB — COMPREHENSIVE METABOLIC PANEL
ALT: 18 U/L (ref 0–35)
AST: 25 U/L (ref 0–37)
Albumin: 4 g/dL (ref 3.5–5.2)
Alkaline Phosphatase: 68 U/L (ref 39–117)
Anion gap: 16 — ABNORMAL HIGH (ref 5–15)
BUN: 7 mg/dL (ref 6–23)
CO2: 25 mEq/L (ref 19–32)
Calcium: 9.7 mg/dL (ref 8.4–10.5)
Chloride: 99 mEq/L (ref 96–112)
Creatinine, Ser: 0.58 mg/dL (ref 0.50–1.10)
GFR calc Af Amer: >90 mL/min (ref >90)
GFR calc non Af Amer: >90 mL/min (ref >90)
Glucose, Bld: 97 mg/dL (ref 70–99)
Potassium: 4.4 mEq/L (ref 3.7–5.3)
Sodium: 140 mEq/L (ref 137–147)
Total Bilirubin: 0.4 mg/dL (ref 0.3–1.2)
Total Protein: 8 g/dL (ref 6.0–8.3)

## 2013-09-25 LAB — CBC WITH DIFFERENTIAL/PLATELET
Basophils Absolute: 0 10*3/uL (ref 0.0–0.1)
Basophils Relative: 0 % (ref 0–1)
Eosinophils Absolute: 0.1 10*3/uL (ref 0.0–0.7)
Eosinophils Relative: 1 % (ref 0–5)
HCT: 42.1 % (ref 36.0–46.0)
Hemoglobin: 14.3 g/dL (ref 12.0–15.0)
Lymphocytes Relative: 23 % (ref 12–46)
Lymphs Abs: 1.7 10*3/uL (ref 0.7–4.0)
MCH: 27.3 pg (ref 26.0–34.0)
MCHC: 34 g/dL (ref 30.0–36.0)
MCV: 80.5 fL (ref 78.0–100.0)
Monocytes Absolute: 0.4 10*3/uL (ref 0.1–1.0)
Monocytes Relative: 5 % (ref 3–12)
Neutro Abs: 5.5 10*3/uL (ref 1.7–7.7)
Neutrophils Relative %: 71 % (ref 43–77)
Platelets: 337 10*3/uL (ref 150–400)
RBC: 5.23 MIL/uL — ABNORMAL HIGH (ref 3.87–5.11)
RDW: 13.9 % (ref 11.5–15.5)
WBC: 7.7 10*3/uL (ref 4.0–10.5)

## 2013-09-25 LAB — RAPID URINE DRUG SCREEN, HOSP PERFORMED
Amphetamines: NOT DETECTED
Barbiturates: NOT DETECTED
Benzodiazepines: NOT DETECTED
Cocaine: NOT DETECTED
Opiates: NOT DETECTED
Tetrahydrocannabinol: POSITIVE — AB

## 2013-09-25 LAB — TROPONIN I: Troponin I: <0.3 ng/mL (ref <0.30)

## 2013-09-25 LAB — LIPASE, BLOOD: Lipase: 32 U/L (ref 11–59)

## 2013-09-25 LAB — PHENYTOIN LEVEL, TOTAL: Phenytoin Lvl: <2.5 ug/mL — ABNORMAL LOW (ref 10.0–20.0)

## 2013-09-25 MED ORDER — HYDROCODONE-ACETAMINOPHEN 5-325 MG PO TABS
1.0000 | ORAL_TABLET | Freq: Once | ORAL | Status: AC
  Administered 2013-09-25: 1 via ORAL
  Filled 2013-09-25: qty 1

## 2013-09-25 MED ORDER — LORAZEPAM 2 MG/ML IJ SOLN
0.5000 mg | Freq: Once | INTRAMUSCULAR | Status: AC
  Administered 2013-09-25: 0.5 mg via INTRAVENOUS
  Filled 2013-09-25: qty 1

## 2013-09-25 MED ORDER — PHENYTOIN SODIUM EXTENDED 100 MG PO CAPS
400.0000 mg | ORAL_CAPSULE | Freq: Once | ORAL | Status: AC
  Administered 2013-09-25: 400 mg via ORAL
  Filled 2013-09-25: qty 4

## 2013-09-25 NOTE — Telephone Encounter (Signed)
[phone] prior auth number obtained for MRI C spine.

## 2013-09-25 NOTE — Discharge Instructions (Signed)
Call Ruth Bell on Monday, 09/28/2013 to schedule her next followup appointment. She will likely refer you to a specialist for relief of your neck pain. Take all of your medications as prescribed. Tonight's lab results show that you had no Dilantin in your bloodstream
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
## 2013-09-25 NOTE — ED Notes (Signed)
Pt has been having dizzy spells for two months; says she has pain up her neck and down her rt arm and she then feels dizzy; occurs daily, but normally sits when she feels this way; this am at 0700, she felt the sxs, but passed out; daughter caught her and she didn't hurt herself; still sees spots before her eyes at times, including now; c/o nausea, but has no appetite; has slurred speech at times and weakness in her rt arm; denies Chest pain; heartbeat feels fast at one min and then slows back down

## 2013-09-25 NOTE — ED Notes (Signed)
Patient transported to MRI 

## 2013-09-25 NOTE — Telephone Encounter (Signed)
Noted  

## 2013-09-25 NOTE — ED Provider Notes (Signed)
CSN: [id number]     Arrival date & time 09/25/13  1457 History   First MD Initiated Contact with Patient 09/25/13 1504     Chief Complaint  Patient presents with  . Dizziness     (Consider location/radiation/quality/duration/timing/severity/associated sxs/prior Treatment) HPI He is patient reportedly had syncopal event today. She's had multiple syncopal event throughout the week. She also reports vomiting multiple times throughout the week. And having difficulty holding down food without vomiting. She complains of right-sided neck pain radiating to the right ear her head for the past several days. Seen by Gerilyn Pilgrim yesterday in the office for same complaint. MRI of cervical spine was scheduled but not yet obtained. Patient denies abdominal pain denies chest pain. She does complain of chronic right shoulder pain worse with moving secondary to "rotator cuff" Past Medical History  Diagnosis Date  . Seizures     history of pseudoseizure disorder  . Migraine, unspecified, without mention of intractable migraine without mention of status migrainosus   . Anemia   . Menometrorrhagia 2006  . Uterine leiomyoma 2006  . Depression   . Hypertension   . Tubular adenoma of colon 05/2011   Past Surgical History  Procedure Laterality Date  . Appendectomy    . Bilateral salpingoophorectomy  04/06/04  . Abdominal hysterectomy  04/06/04  . Rotator cuff repair     Family History  Problem Relation Age of Onset  . Diabetes Other   . Hypertension Other   . Heart disease Mother   . Colon cancer Father    History  Substance Use Topics  . Smoking status: Current Every Day Smoker -- 40 years    Types: Cigarettes    Start date: 03/11/2013  . Smokeless tobacco: Never Used     Comment: 3-4 cigarettes daily  . Alcohol Use: No   OB History   Grav Para Term Preterm Abortions TAB SAB Ect Mult Living                 Review of Systems  Gastrointestinal: Positive for vomiting.   Musculoskeletal: Positive for arthralgias and neck pain.       Right shoulder pain and stiffness, chronic  Neurological: Positive for headaches.      Allergies  Review of patient's allergies indicates no known allergies.  Home Medications   Prior to Admission medications   Medication Sig Start Date End Date Taking? Authorizing Provider  albuterol (PROVENTIL HFA;VENTOLIN HFA) 108 (90 BASE) MCG/ACT inhaler Inhale 2 puffs into the lungs every 6 (six) hours as needed for wheezing. 09/04/11   Debbrah Alar, NP  ARIPiprazole (ABILIFY) 2 MG tablet Take 2 mg by mouth daily.    Historical Provider, MD  citalopram (CELEXA) 20 MG tablet Take 1 tablet (20 mg total) by mouth daily. 03/24/13   Debbrah Alar, NP  cyclobenzaprine (FLEXERIL) 5 MG tablet One tablet by mouth 3 times daily for shoulder- may cause drowsiness. 09/23/13   Debbrah Alar, NP  gabapentin (NEURONTIN) 600 MG tablet Take 1 tablet (600 mg total) by mouth 3 (three) times daily. 03/24/13   Debbrah Alar, NP  LORazepam (ATIVAN) 0.5 MG tablet Take 1 tablet (0.5 mg total) by mouth every 8 (eight) hours as needed for anxiety. 03/24/13   Debbrah Alar, NP  methylPREDNISolone (MEDROL DOSEPAK) 4 MG tablet follow package directions 09/23/13   Debbrah Alar, NP  metoprolol tartrate (LOPRESSOR) 25 MG tablet Take 25 mg by mouth 2 (two) times daily.    Historical Provider, MD  Multiple Vitamin (  MULITIVITAMIN WITH MINERALS) TABS Take 1 tablet by mouth daily.    Historical Provider, MD  ondansetron (ZOFRAN) 4 MG tablet Take 1 tablet (4 mg total) by mouth every 8 (eight) hours as needed for nausea or vomiting. 09/23/13   Debbrah Alar, NP  pantoprazole (PROTONIX) 40 MG tablet Take 1 tablet (40 mg total) by mouth daily. 09/23/13   Debbrah Alar, NP  phenytoin (DILANTIN) 100 MG ER capsule Take 2 capsules (200 mg total) by mouth daily. 09/23/13 09/23/14  Debbrah Alar, NP   There were no vitals taken for this visit. Physical  Exam  Nursing note and vitals reviewed. Constitutional: She is oriented to person, place, and time. She appears well-developed and well-nourished.  tearful  HENT:  Head: Normocephalic and atraumatic.  Eyes: Conjunctivae are normal. Pupils are equal, round, and reactive to light.  Neck: Neck supple. No tracheal deviation present. No thyromegaly present.  Cardiovascular: Normal rate and regular rhythm.   No murmur heard. Pulmonary/Chest: Effort normal and breath sounds normal.  Abdominal: Soft. Bowel sounds are normal. She exhibits no distension. There is no tenderness.  Musculoskeletal: Normal range of motion. She exhibits no edema and no tenderness.  Right upper extremity Limited range of motion of right shoulder secondary to pain. Radial pulse 2+. No soft tissue swelling or redness. All other chemistry redness swelling or tenderness neurovascular intact.  Neurological: She is alert and oriented to person, place, and time. She has normal reflexes. Coordination normal.  Left mouth droop which is volitional and resolves upon command. Other cranial nerves II through XII grossly intact. Gait normal. Motor strength 5 over 5 overall with the exception of right shoulder  Skin: Skin is warm and dry. No rash noted.  Psychiatric: She has a normal mood and affect.    ED Course  Procedures (including critical care time) Labs Review Labs Reviewed - No data to display  Imaging Review No results found.   EKG Interpretation   Date/Time:  Friday September 25 2013 15:20:33 EDT Ventricular Rate:  65 PR Interval:  162 QRS Duration: 93 QT Interval:  419 QTC Calculation: 436 R Axis:   63 Text Interpretation:  Sinus rhythm Biatrial enlargement RSR' in V1 or V2,  probably normal variant No significant change since last tracing Confirmed  by Winfred Leeds  MD, [place] [phone]) on 09/25/2013 3:43:15 PM     Debbrah Alar called for further history. It is uncertain whether patient in fact has seizure disorder.  She's prescribed Dilantin by an outside neurologist she's never been tested specifically for seizures .edthis Patient medicated with Norco and Ativan for pain and sedation prior to going to MRI scan. 8:05 PM patient sleeping easily arousable alert ambulates without difficulty. Results for orders placed during the hospital encounter of 09/25/13  TROPONIN I      Result Value Ref Range   Troponin I <0.30  <0.30 ng/mL  COMPREHENSIVE METABOLIC PANEL      Result Value Ref Range   Sodium 140  137 - 147 mEq/L   Potassium 4.4  3.7 - 5.3 mEq/L   Chloride 99  96 - 112 mEq/L   CO2 25  19 - 32 mEq/L   Glucose, Bld 97  70 - 99 mg/dL   BUN 7  6 - 23 mg/dL   Creatinine, Ser 0.58  0.50 - 1.10 mg/dL   Calcium 9.7  8.4 - 10.5 mg/dL   Total Protein 8.0  6.0 - 8.3 g/dL   Albumin 4.0  3.5 - 5.2 g/dL  AST 25  0 - 37 U/L   ALT 18  0 - 35 U/L   Alkaline Phosphatase 68  39 - 117 U/L   Total Bilirubin 0.4  0.3 - 1.2 mg/dL   GFR calc non Af Amer >90  >90 mL/min   GFR calc Af Amer >90  >90 mL/min   Anion gap 16 (*) 5 - 15  CBC WITH DIFFERENTIAL      Result Value Ref Range   WBC 7.7  4.0 - 10.5 K/uL   RBC 5.23 (*) 3.87 - 5.11 MIL/uL   Hemoglobin 14.3  12.0 - 15.0 g/dL   HCT 42.1  36.0 - 46.0 %   MCV 80.5  78.0 - 100.0 fL   MCH 27.3  26.0 - 34.0 pg   MCHC 34.0  30.0 - 36.0 g/dL   RDW 13.9  11.5 - 15.5 %   Platelets 337  150 - 400 K/uL   Neutrophils Relative % 71  43 - 77 %   Neutro Abs 5.5  1.7 - 7.7 K/uL   Lymphocytes Relative 23  12 - 46 %   Lymphs Abs 1.7  0.7 - 4.0 K/uL   Monocytes Relative 5  3 - 12 %   Monocytes Absolute 0.4  0.1 - 1.0 K/uL   Eosinophils Relative 1  0 - 5 %   Eosinophils Absolute 0.1  0.0 - 0.7 K/uL   Basophils Relative 0  0 - 1 %   Basophils Absolute 0.0  0.0 - 0.1 K/uL  PHENYTOIN LEVEL, TOTAL      Result Value Ref Range   Phenytoin Lvl <2.5 (*) 10.0 - 20.0 ug/mL  URINE RAPID DRUG SCREEN (HOSP PERFORMED)      Result Value Ref Range   Opiates NONE DETECTED  NONE DETECTED    Cocaine NONE DETECTED  NONE DETECTED   Benzodiazepines NONE DETECTED  NONE DETECTED   Amphetamines NONE DETECTED  NONE DETECTED   Tetrahydrocannabinol POSITIVE (*) NONE DETECTED   Barbiturates NONE DETECTED  NONE DETECTED  LIPASE, BLOOD      Result Value Ref Range   Lipase 32  11 - 59 U/L   Mr Brain Wo Contrast  09/25/2013   CLINICAL DATA:  Headache with syncope.  EXAM: MRI HEAD WITHOUT CONTRAST  TECHNIQUE: Multiplanar, multiecho pulse sequences of the brain and surrounding structures were obtained without intravenous contrast.  COMPARISON:  MRI cervical spine reported separately. CT head 07/02/2012. MRI head 02/24/2012.  FINDINGS: No evidence for acute infarction, hemorrhage, mass lesion, hydrocephalus, or extra-axial fluid. Normal for age cerebral volume. No white matter disease. Flow voids are maintained throughout the carotid, basilar, and vertebral arteries. There are no areas of chronic hemorrhage. Pituitary, pineal, and cerebellar tonsils unremarkable. No upper cervical lesions. Visualized calvarium, skull base, and upper cervical osseous structures unremarkable. Scalp and extracranial soft tissues, orbits, sinuses, and mastoids show no acute process.  IMPRESSION: Negative exam. No acute intracranial findings. No change from prior normal MR and CT.   Electronically Signed   By: Rolla Flatten M.D.   On: 09/25/2013 19:11   Mr Cervical Spine Wo Contrast  09/25/2013   CLINICAL DATA:  Headache and syncope. Neck pain radiating to the right.  EXAM: MRI CERVICAL SPINE WITHOUT CONTRAST  TECHNIQUE: Multiplanar, multisequence MR imaging of the cervical spine was performed. No intravenous contrast was administered.  COMPARISON:  MRI of from 02/05/2006 is available.  FINDINGS: The patient was unable to remain motionless for the exam. Small or subtle lesions  could be overlooked.  The alignment is anatomic. Slight disc space narrowing C5-C6. Normal cord size and signal throughout. Marrow signal homogeneous.   The individual disc spaces were examined as follows:  C2-3:  Normal.  C3-4:  Normal.  C4-5: Right-sided annular bulging. Right-sided facet arthropathy with joint effusion and bone marrow edema. Probable right C5 nerve root impingement.  C5-6: Central and leftward protrusion. Left-sided uncinate spurring. Left C6 nerve root impingement is likely.  C6-7:  Normal.  C7-T1: Normal disc space. Mild facet arthropathy on the right with marked facet arthropathy in bone marrow edema on the left. Left-sided foraminal narrowing. Possible left C8 nerve root impingement.  Some progression of disease compared with 2007.  IMPRESSION: Prominent right-sided pathology at C4-5, with annular bulging, facet joint effusion, and regional bone marrow edema. Probable right C5 nerve root impingement.  Left-sided pathology at C5-6 and L3-Y1 of uncertain significance. See discussion above.  No acute abnormality is detected.   Electronically Signed   By: Rolla Flatten M.D.   On: 09/25/2013 19:25   Pt drank without nausea or vomiting in the ed MDM  I discussed MRI scan of cervical spine with Dr. Jola Baptist, radiologist. Findings are chronic but likely the cause of patient's pain. He suggests she can be referred to interventional radiology for injection locally. I will refer back to Debbrah Alar in order that she can refer patient to interventional radiologist. Should be administered phenytoin MS phenotype discharged and encouraged to take her medications as prescribed. She does admit to noncompliance with phenytoin.  Final diagnoses:  None  pt has no mouth droop when distracted and mouth droop gone upon from MRI  Dx #1 syncope differentilax dx is seizure vs pseudoseizure #2 neck pain #3 medication non compliance     Orlie Dakin, MD 09/25/13 2016

## 2013-09-25 NOTE — ED Notes (Signed)
Pt discharged to home via wheelchair with daughter at bedside.  Pain medication given prior to discharge - home dose Dilantin 400 mg.  See MAR for more details.  Discharge instructions discussed.  Pt has follow up appointment with PCP on Monday morning.  Pt verbalized understanding.  No other concerns at this time.  Iantha Fallen RN 8:42 PM 09/25/2013

## 2013-09-26 ENCOUNTER — Ambulatory Visit (HOSPITAL_BASED_OUTPATIENT_CLINIC_OR_DEPARTMENT_OTHER): Payer: 59

## 2013-09-26 ENCOUNTER — Telehealth: Payer: Self-pay | Admitting: Family

## 2013-09-26 DIAGNOSIS — M5010 Cervical disc disorder with radiculopathy, unspecified cervical region: Secondary | ICD-10-CM

## 2013-09-26 NOTE — Telephone Encounter (Signed)
Spoke with pt. Discussed MRI results and that possibility of referral for spinal injection. She declines- would prefer referral to neurosurgery. Will arrange.   Colletta [place], could you please arrange a 2 week follow up appointment?

## 2013-09-26 NOTE — Telephone Encounter (Signed)
Message copied by Debbrah Alar on Sat Sep 26, 2013  8:51 AM ------      Message from: Orlie Dakin      Created: Fri Sep 25, 2013  8:17 PM       Ms. Bragdon had a workup at [facility] long ED. You can see the results of the MR of cervical spine. I spoke with Dr. Jola Baptist, radiologist at [facility], feels that the findings are chronic and long-standing. They are however the cause of her pain. He suggests that she can be referred to interventional radiology for local injection and symptomatic relief.      Thanks,       Sam Jacubowitz ------

## 2013-09-28 ENCOUNTER — Ambulatory Visit: Payer: 59 | Admitting: Family Medicine

## 2013-09-29 ENCOUNTER — Ambulatory Visit (INDEPENDENT_AMBULATORY_CARE_PROVIDER_SITE_OTHER): Payer: 59 | Admitting: Family Medicine

## 2013-09-29 ENCOUNTER — Ambulatory Visit: Payer: 59 | Admitting: Family Medicine

## 2013-09-29 ENCOUNTER — Encounter: Payer: Self-pay | Admitting: Family Medicine

## 2013-09-29 VITALS — BP 147/89 | HR 68 | Ht 62.0 in | Wt 104.0 lb

## 2013-09-29 DIAGNOSIS — M25511 Pain in right shoulder: Secondary | ICD-10-CM

## 2013-09-29 DIAGNOSIS — M25519 Pain in unspecified shoulder: Secondary | ICD-10-CM

## 2013-10-01 ENCOUNTER — Encounter: Payer: Self-pay | Admitting: Family Medicine

## 2013-10-01 NOTE — Progress Notes (Signed)
Patient ID: Ruth Bell, female   DOB: 04/27/1960, 53 y.o.   MRN: [id number]  PCP and Referred by: Nance Pear., NP  Subjective:   HPI: Patient is a 53 y.o. female here for right shoulder pain.  Patient comes in today with right sided shoulder pain over 2 months. No known injury. States she woke up one day with pain in right side of neck, shoulder. Has progressed over time to include pain all the way into fingers and has also lost all feeling in digits right hand. Pain with all motions of right arm. Using a sling for comfort. Being referred to neurosurgery following cervical MRI showing probable right C5 nerve impingement. No change in symptoms with oral prednisone.  Past Medical History  Diagnosis Date  . Seizures     history of pseudoseizure disorder  . Migraine, unspecified, without mention of intractable migraine without mention of status migrainosus   . Anemia   . Menometrorrhagia 2006  . Uterine leiomyoma 2006  . Depression   . Hypertension   . Tubular adenoma of colon 05/2011    Current Outpatient Prescriptions on File Prior to Visit  Medication Sig Dispense Refill  . albuterol (PROVENTIL HFA;VENTOLIN HFA) 108 (90 BASE) MCG/ACT inhaler Inhale 2 puffs into the lungs every 6 (six) hours as needed for wheezing.  1 Inhaler  2  . ARIPiprazole (ABILIFY) 2 MG tablet Take 2 mg by mouth daily.      . citalopram (CELEXA) 20 MG tablet Take 1 tablet (20 mg total) by mouth daily.  30 tablet  3  . cyclobenzaprine (FLEXERIL) 5 MG tablet One tablet by mouth 3 times daily for shoulder- may cause drowsiness.  30 tablet  0  . gabapentin (NEURONTIN) 600 MG tablet Take 1 tablet (600 mg total) by mouth 3 (three) times daily.  90 tablet  2  . LORazepam (ATIVAN) 0.5 MG tablet Take 1 tablet (0.5 mg total) by mouth every 8 (eight) hours as needed for anxiety.  30 tablet  0  . methylPREDNISolone (MEDROL DOSEPAK) 4 MG tablet follow package directions  21 tablet  0  . metoprolol tartrate  (LOPRESSOR) 25 MG tablet Take 25 mg by mouth 2 (two) times daily.      . Multiple Vitamin (MULITIVITAMIN WITH MINERALS) TABS Take 1 tablet by mouth daily.      . ondansetron (ZOFRAN) 4 MG tablet Take 1 tablet (4 mg total) by mouth every 8 (eight) hours as needed for nausea or vomiting.  20 tablet  0  . pantoprazole (PROTONIX) 40 MG tablet Take 1 tablet (40 mg total) by mouth daily.  30 tablet  2  . phenytoin (DILANTIN) 100 MG ER capsule Take 2 capsules (200 mg total) by mouth daily.  180 capsule  0   No current facility-administered medications on file prior to visit.    Past Surgical History  Procedure Laterality Date  . Appendectomy    . Bilateral salpingoophorectomy  04/06/04  . Abdominal hysterectomy  04/06/04  . Rotator cuff repair      No Known Allergies  History   Social History  . Marital Status: Married    Spouse Name: Gwyndolyn Saxon    Number of Children: N/A  . Years of Education: N/A   Occupational History  . CMA    Social History Main Topics  . Smoking status: Current Every Day Smoker -- 40 years    Types: Cigarettes    Start date: 03/11/2013  . Smokeless tobacco: Never Used  Comment: 3-4 cigarettes daily  . Alcohol Use: No  . Drug Use: No  . Sexual Activity: Yes    Birth Control/ Protection: None   Other Topics Concern  . Not on file   Social History Narrative  . No narrative on file    Family History  Problem Relation Age of Onset  . Diabetes Other   . Hypertension Other   . Heart disease Mother   . Colon cancer Father     BP 147/89  Pulse 68  Ht 5\' 2"  (1.575 m)  Wt 104 lb (47.174 kg)  BMI 19.02 kg/m2  Review of Systems: See HPI above.    Objective:  Physical Exam:  Gen: NAD  Right shoulder: No swelling, ecchymoses.  No gross deformity. Diffuse TTP right paraspinal cervical muscles to shoulder, upper arm. Very limited ROM shoulder even passively due to pain.  Pain with neck, elbow motions also. Unable to position for hawkins, neers,  empty can.    Assessment & Plan:  1. Right shoulder pain - Based on patient's history, cervical spine MRI, and exam I suspect a significant proportion of her symptoms are due to radiculopathy.  We discussed she may also have rotator cuff impingement but typically pain would not be this severe, would not have pain with all extremity motions, and not associated with symptoms past the elbow and into the neck.  Advised to go forward with neurosurgical evaluation and treatment, follow up with me as needed or after their treatment has completed.

## 2013-10-01 NOTE — Assessment & Plan Note (Signed)
Based on patient's history, cervical spine MRI, and exam I suspect a significant proportion of her symptoms are due to radiculopathy.  We discussed she may also have rotator cuff impingement but typically pain would not be this severe, would not have pain with all extremity motions, and not associated with symptoms past the elbow and into the neck.  Advised to go forward with neurosurgical evaluation and treatment, follow up with me as needed or after their treatment has completed.

## 2013-10-07 ENCOUNTER — Encounter (HOSPITAL_COMMUNITY): Payer: Self-pay | Admitting: Emergency Medicine

## 2013-10-07 ENCOUNTER — Emergency Department (HOSPITAL_COMMUNITY)
Admission: EM | Admit: 2013-10-07 | Discharge: 2013-10-07 | Disposition: A | Discharge status: Home / Self Care | Payer: 59 | Attending: Emergency Medicine | Admitting: Emergency Medicine

## 2013-10-07 DIAGNOSIS — G40909 Epilepsy, unspecified, not intractable, without status epilepticus: Secondary | ICD-10-CM | POA: Insufficient documentation

## 2013-10-07 DIAGNOSIS — R404 Transient alteration of awareness: Secondary | ICD-10-CM | POA: Insufficient documentation

## 2013-10-07 DIAGNOSIS — F17200 Nicotine dependence, unspecified, uncomplicated: Secondary | ICD-10-CM | POA: Insufficient documentation

## 2013-10-07 DIAGNOSIS — Z79899 Other long term (current) drug therapy: Secondary | ICD-10-CM | POA: Insufficient documentation

## 2013-10-07 DIAGNOSIS — Z8742 Personal history of other diseases of the female genital tract: Secondary | ICD-10-CM | POA: Insufficient documentation

## 2013-10-07 DIAGNOSIS — Z862 Personal history of diseases of the blood and blood-forming organs and certain disorders involving the immune mechanism: Secondary | ICD-10-CM | POA: Insufficient documentation

## 2013-10-07 DIAGNOSIS — M25519 Pain in unspecified shoulder: Secondary | ICD-10-CM | POA: Insufficient documentation

## 2013-10-07 DIAGNOSIS — Z8719 Personal history of other diseases of the digestive system: Secondary | ICD-10-CM | POA: Insufficient documentation

## 2013-10-07 DIAGNOSIS — F3289 Other specified depressive episodes: Secondary | ICD-10-CM | POA: Insufficient documentation

## 2013-10-07 DIAGNOSIS — I10 Essential (primary) hypertension: Secondary | ICD-10-CM | POA: Insufficient documentation

## 2013-10-07 DIAGNOSIS — M25511 Pain in right shoulder: Secondary | ICD-10-CM

## 2013-10-07 DIAGNOSIS — G43909 Migraine, unspecified, not intractable, without status migrainosus: Secondary | ICD-10-CM | POA: Insufficient documentation

## 2013-10-07 DIAGNOSIS — Z791 Long term (current) use of non-steroidal anti-inflammatories (NSAID): Secondary | ICD-10-CM | POA: Insufficient documentation

## 2013-10-07 DIAGNOSIS — F329 Major depressive disorder, single episode, unspecified: Secondary | ICD-10-CM | POA: Insufficient documentation

## 2013-10-07 DIAGNOSIS — M5412 Radiculopathy, cervical region: Secondary | ICD-10-CM

## 2013-10-07 DIAGNOSIS — F411 Generalized anxiety disorder: Secondary | ICD-10-CM | POA: Insufficient documentation

## 2013-10-07 DIAGNOSIS — R55 Syncope and collapse: Secondary | ICD-10-CM

## 2013-10-07 LAB — CBC
HCT: 41.1 % (ref 36.0–46.0)
Hemoglobin: 13.9 g/dL (ref 12.0–15.0)
MCH: 27.4 pg (ref 26.0–34.0)
MCHC: 33.8 g/dL (ref 30.0–36.0)
MCV: 81.1 fL (ref 78.0–100.0)
Platelets: 314 10*3/uL (ref 150–400)
RBC: 5.07 MIL/uL (ref 3.87–5.11)
RDW: 14.2 % (ref 11.5–15.5)
WBC: 8.9 10*3/uL (ref 4.0–10.5)

## 2013-10-07 LAB — BASIC METABOLIC PANEL
Anion gap: 13 (ref 5–15)
BUN: 15 mg/dL (ref 6–23)
CO2: 26 mEq/L (ref 19–32)
Calcium: 9.6 mg/dL (ref 8.4–10.5)
Chloride: 101 mEq/L (ref 96–112)
Creatinine, Ser: 0.46 mg/dL — ABNORMAL LOW (ref 0.50–1.10)
GFR calc Af Amer: >90 mL/min (ref >90)
GFR calc non Af Amer: >90 mL/min (ref >90)
Glucose, Bld: 130 mg/dL — ABNORMAL HIGH (ref 70–99)
Potassium: 4.1 mEq/L (ref 3.7–5.3)
Sodium: 140 mEq/L (ref 137–147)

## 2013-10-07 LAB — I-STAT TROPONIN, ED: Troponin i, poc: 0 ng/mL (ref 0.00–0.08)

## 2013-10-07 MED ORDER — IBUPROFEN 800 MG PO TABS: 800.0000 mg | ORAL_TABLET | Freq: Three times a day (TID) | ORAL | Status: DC

## 2013-10-07 MED ORDER — LORAZEPAM 2 MG/ML IJ SOLN
INTRAMUSCULAR | Status: DC
Start: 2013-10-07 — End: 2013-10-07
  Filled 2013-10-07: qty 1

## 2013-10-07 MED ORDER — KETOROLAC TROMETHAMINE 60 MG/2ML IM SOLN
60.0000 mg | Freq: Once | INTRAMUSCULAR | Status: AC
Start: 2013-10-07 — End: 2013-10-07
  Administered 2013-10-07: 60 mg via INTRAMUSCULAR
  Filled 2013-10-07: qty 2

## 2013-10-07 MED ORDER — LORAZEPAM 2 MG/ML IJ SOLN
1.0000 mg | Freq: Once | INTRAMUSCULAR | Status: AC
  Administered 2013-10-07: 1 mg via INTRAMUSCULAR

## 2013-10-07 NOTE — ED Provider Notes (Signed)
Medical screening examination/treatment/procedure(s) were conducted as a shared visit with non-physician practitioner(s) and myself.  I personally evaluated the patient during the encounter.   EKG Interpretation   Date/Time:  Wednesday October 07 2013 11:05:39 EDT Ventricular Rate:  90 PR Interval:    QRS Duration: 92 QT Interval:  381 QTC Calculation: 466 R Axis:   66 Text Interpretation:  Sinus rhythm Borderline T wave abnormalities  Baseline wander in lead(s) I II III aVR aVL Confirmed by Koden Hunzeker  MD-J, Kashton Mcartor  ([id number]) on 10/07/2013 12:27:33 PM      Discussed findings with patient family.  Encouraged follow up with her surgeon regarding her persistent shoulder trouble.  At this time there does not appear to be any evidence of an acute emergency medical condition and the patient appears stable for discharge with appropriate outpatient follow up.    Dorie Rank, MD 10/07/13 1302

## 2013-10-07 NOTE — ED Notes (Signed)
Pt brought back to room unconscious.  Pt not responsive to pain, voice.  Ammonia inhalant used.  Pt woke up and started screaming.  Jumped out of the chair.  Pt's daughter states that she is having pain in her rotator cuff and is having surgery soon.  Daughter states that the pain has been causing her to black out.  Robyn, [place] brought to room.  During her assessment, pt passed out again.  Pt laid down on bed and another ammonia inhalant was used.  Pt again woke up and started screaming.

## 2013-10-07 NOTE — Discharge Instructions (Signed)
Take ibuprofen as directed as needed for pain. Follow up with your orthopedist.  Cervical Radiculopathy Cervical radiculopathy happens when a nerve in the neck is pinched or bruised by a slipped (herniated) disk or by arthritic changes in the bones of the cervical spine. This can occur due to an injury or as part of the normal aging process. Pressure on the cervical nerves can cause pain or numbness that runs from your neck all the way down into your arm and fingers. CAUSES  There are many possible causes, including:  Injury.  Muscle tightness in the neck from overuse.  Swollen, painful joints (arthritis).  Breakdown or degeneration in the bones and joints of the spine (spondylosis) due to aging.  Bone spurs that may develop near the cervical nerves. SYMPTOMS  Symptoms include pain, weakness, or numbness in the affected arm and hand. Pain can be severe or irritating. Symptoms may be worse when extending or turning the neck. DIAGNOSIS  Your caregiver will ask about your symptoms and do a physical exam. He or she may test your strength and reflexes. X-rays, CT scans, and MRI scans may be needed in cases of injury or if the symptoms do not go away after a period of time. Electromyography (EMG) or nerve conduction testing may be done to study how your nerves and muscles are working. TREATMENT  Your caregiver may recommend certain exercises to help relieve your symptoms. Cervical radiculopathy can, and often does, get better with time and treatment. If your problems continue, treatment options may include:  Wearing a soft collar for short periods of time.  Physical therapy to strengthen the neck muscles.  Medicines, such as nonsteroidal anti-inflammatory drugs (NSAIDs), oral corticosteroids, or spinal injections.  Surgery. Different types of surgery may be done depending on the cause of your problems. HOME CARE INSTRUCTIONS   Put ice on the affected area.  Put ice in a plastic  bag.  Place a towel between your skin and the bag.  Leave the ice on for 15-20 minutes, 03-04 times a day or as directed by your caregiver.  If ice does not help, you can try using heat. Take a warm shower or bath, or use a hot water bottle as directed by your caregiver.  You may try a gentle neck and shoulder massage.  Use a flat pillow when you sleep.  Only take over-the-counter or prescription medicines for pain, discomfort, or fever as directed by your caregiver.  If physical therapy was prescribed, follow your caregiver's directions.  If a soft collar was prescribed, use it as directed. SEEK IMMEDIATE MEDICAL CARE IF:   Your pain gets much worse and cannot be controlled with medicines.  You have weakness or numbness in your hand, arm, face, or leg.  You have a high fever or a stiff, rigid neck.  You lose bowel or bladder control (incontinence).  You have trouble with walking, balance, or speaking. MAKE SURE YOU:   Understand these instructions.  Will watch your condition.  Will get help right away if you are not doing well or get worse. Document Released: 11/28/2000 Document Revised: 05/28/2011 Document Reviewed: 10/17/2010 [facility] Patient Information 2015 [place], [place]. This information is not intended to replace advice given to you by your health care provider. Make sure you discuss any questions you have with your health care provider.  Shoulder Pain The shoulder is the joint that connects your arms to your body. The bones that form the shoulder joint include the upper arm bone (humerus),  the shoulder blade (scapula), and the collarbone (clavicle). The top of the humerus is shaped like a ball and fits into a rather flat socket on the scapula (glenoid cavity). A combination of muscles and strong, fibrous tissues that connect muscles to bones (tendons) support your shoulder joint and hold the ball in the socket. Small, fluid-filled sacs (bursae) are located in  different areas of the joint. They act as cushions between the bones and the overlying soft tissues and help reduce friction between the gliding tendons and the bone as you move your arm. Your shoulder joint allows a wide range of motion in your arm. This range of motion allows you to do things like scratch your back or throw a ball. However, this range of motion also makes your shoulder more prone to pain from overuse and injury. Causes of shoulder pain can originate from both injury and overuse and usually can be grouped in the following four categories:  Redness, swelling, and pain (inflammation) of the tendon (tendinitis) or the bursae (bursitis).  Instability, such as a dislocation of the joint.  Inflammation of the joint (arthritis).  Broken bone (fracture). HOME CARE INSTRUCTIONS   Apply ice to the sore area.  Put ice in a plastic bag.  Place a towel between your skin and the bag.  Leave the ice on for 15-20 minutes, 3-4 times per day for the first 2 days, or as directed by your health care provider.  Stop using cold packs if they do not help with the pain.  If you have a shoulder sling or immobilizer, wear it as long as your caregiver instructs. Only remove it to shower or bathe. Move your arm as little as possible, but keep your hand moving to prevent swelling.  Squeeze a soft ball or foam pad as much as possible to help prevent swelling.  Only take over-the-counter or prescription medicines for pain, discomfort, or fever as directed by your caregiver. SEEK MEDICAL CARE IF:   Your shoulder pain increases, or new pain develops in your arm, hand, or fingers.  Your hand or fingers become cold and numb.  Your pain is not relieved with medicines. SEEK IMMEDIATE MEDICAL CARE IF:   Your arm, hand, or fingers are numb or tingling.  Your arm, hand, or fingers are significantly swollen or turn white or blue. MAKE SURE YOU:   Understand these instructions.  Will watch your  condition.  Will get help right away if you are not doing well or get worse. Document Released: 12/13/2004 Document Revised: 03/10/2013 Document Reviewed: 02/17/2011 [facility] Patient Information 2015 [place], [place]. This information is not intended to replace advice given to you by your health care provider. Make sure you discuss any questions you have with your health care provider.

## 2013-10-07 NOTE — ED Provider Notes (Signed)
CSN: [id number]     Arrival date & time 10/07/13  1053 History   First MD Initiated Contact with Patient 10/07/13 1056     Chief Complaint  Patient presents with  . Loss of Consciousness     (Consider location/radiation/quality/duration/timing/severity/associated sxs/prior Treatment) HPI Comments: 53 year old female with a past medical history of pseudoseizures, migraines, anemia, depression and hypertension presents to the emergency department with her daughter complaining of worsening right shoulder pain x2 months. Patient is currently under the care of an orthopedist for right shoulder pain and cervical nerve impingement at C4-C5 and is supposed to be getting surgery. Daughter reports that patient is not on any medicine for "nerve pain" and has not been given any medication at all. On arrival to the emergency department, patient had passed out in the waiting room, was brought back to the exam room, ammonia was placed underneath her nose and she woke up screaming. During questioning, this happened again, ammonia was placed again, and patient woke up. Daughter reports that these are similar to her prior syncopal episodes which happen when patient is in pain. Daughter reports that the pain has been so severe it is causing patient to "pass out". No new injury or trauma. No chest pain, shortness of breath, vision changes. Patient will not respond to questions other than stating that she is in pain and "help me, help me, I hurt". No new injury or trauma.  Patient is a 53 y.o. female presenting with syncope. The history is provided by a relative and the patient.  Loss of Consciousness   Past Medical History  Diagnosis Date  . Seizures     history of pseudoseizure disorder  . Migraine, unspecified, without mention of intractable migraine without mention of status migrainosus   . Anemia   . Menometrorrhagia 2006  . Uterine leiomyoma 2006  . Depression   . Hypertension   . Tubular adenoma of colon  05/2011   Past Surgical History  Procedure Laterality Date  . Appendectomy    . Bilateral salpingoophorectomy  04/06/04  . Abdominal hysterectomy  04/06/04  . Rotator cuff repair     Family History  Problem Relation Age of Onset  . Diabetes Other   . Hypertension Other   . Heart disease Mother   . Colon cancer Father    History  Substance Use Topics  . Smoking status: Current Every Day Smoker -- 40 years    Types: Cigarettes    Start date: 03/11/2013  . Smokeless tobacco: Never Used     Comment: 3-4 cigarettes daily  . Alcohol Use: No   OB History   Grav Para Term Preterm Abortions TAB SAB Ect Mult Living                 Review of Systems  Cardiovascular: Positive for syncope.  Musculoskeletal:       + right shoulder pain.  Neurological: Positive for syncope.  All other systems reviewed and are negative.     Allergies  Review of patient's allergies indicates no known allergies.  Home Medications   Prior to Admission medications   Medication Sig Start Date End Date Taking? Authorizing Provider  albuterol (PROVENTIL HFA;VENTOLIN HFA) 108 (90 BASE) MCG/ACT inhaler Inhale 2 puffs into the lungs every 6 (six) hours as needed for wheezing. 09/04/11  Yes Debbrah Alar, NP  ARIPiprazole (ABILIFY) 2 MG tablet Take 2 mg by mouth daily.   Yes Historical Provider, MD  citalopram (CELEXA) 20 MG tablet Take 1 tablet (  20 mg total) by mouth daily. 03/24/13  Yes Debbrah Alar, NP  cyclobenzaprine (FLEXERIL) 5 MG tablet One tablet by mouth 3 times daily for shoulder- may cause drowsiness. 09/23/13  Yes Debbrah Alar, NP  gabapentin (NEURONTIN) 300 MG capsule Take 600 mg by mouth 3 (three) times daily.   Yes Historical Provider, MD  LORazepam (ATIVAN) 0.5 MG tablet Take 1 tablet (0.5 mg total) by mouth every 8 (eight) hours as needed for anxiety. 03/24/13  Yes Debbrah Alar, NP  methylPREDNISolone (MEDROL DOSEPAK) 4 MG tablet follow package directions 09/23/13  Yes Debbrah Alar, NP  metoprolol tartrate (LOPRESSOR) 25 MG tablet Take 25 mg by mouth 2 (two) times daily.   Yes Historical Provider, MD  Multiple Vitamin (MULITIVITAMIN WITH MINERALS) TABS Take 1 tablet by mouth daily.   Yes Historical Provider, MD  ondansetron (ZOFRAN) 4 MG tablet Take 1 tablet (4 mg total) by mouth every 8 (eight) hours as needed for nausea or vomiting. 09/23/13  Yes Debbrah Alar, NP  pantoprazole (PROTONIX) 40 MG tablet Take 1 tablet (40 mg total) by mouth daily. 09/23/13  Yes Debbrah Alar, NP  phenytoin (DILANTIN) 100 MG ER capsule Take 200 mg by mouth daily.   Yes Debbrah Alar, NP  ibuprofen (ADVIL,MOTRIN) 800 MG tablet Take 1 tablet (800 mg total) by mouth 3 (three) times daily. 10/07/13   Illene Labrador, PA-C   BP 117/80  Pulse 95  Resp 15  SpO2 100% Physical Exam  Nursing note and vitals reviewed. Constitutional: She is oriented to person, place, and time. She appears well-developed and well-nourished. No distress.  Anxious.  HENT:  Head: Normocephalic and atraumatic.  Mouth/Throat: Oropharynx is clear and moist.  Eyes: Conjunctivae and EOM are normal. Pupils are equal, round, and reactive to light.  Neck: Normal range of motion. Neck supple.  Cardiovascular: Normal rate, regular rhythm, normal heart sounds and intact distal pulses.   Pulmonary/Chest: Effort normal and breath sounds normal.  Abdominal: Soft. Bowel sounds are normal. There is no tenderness.  Musculoskeletal: She exhibits no edema.  TTP throughout right shoulder. No deformity or swelling. ROM limited by pain. C-spine normal.  Neurological: She is alert and oriented to person, place, and time.  Skin: Skin is warm and dry. She is not diaphoretic.  Psychiatric: Her mood appears anxious. She is hyperactive.    ED Course  Procedures (including critical care time) Labs Review Labs Reviewed  BASIC METABOLIC PANEL - Abnormal; Notable for the following:    Glucose, Bld 130 (*)     Creatinine, Ser 0.46 (*)    All other components within normal limits  CBC  I-STAT TROPOININ, ED    Imaging Review No results found.   EKG Interpretation   Date/Time:  Wednesday October 07 2013 11:05:39 EDT Ventricular Rate:  90 PR Interval:    QRS Duration: 92 QT Interval:  381 QTC Calculation: 466 R Axis:   66 Text Interpretation:  Sinus rhythm Borderline T wave abnormalities  Baseline wander in lead(s) I II III aVR aVL Confirmed by KNAPP  MD-J, JON  ([id number]) on 10/07/2013 12:27:33 PM      MDM   Final diagnoses:  Right shoulder pain  Cervical radiculopathy  Syncope, unspecified syncope type   Pt presenting with exacerbation of right shoulder pain, had syncopal episode on arrival. Hx of pseud-seizures, arose from ammonia. Anxious but in NAD. AAOx3. Pt received ativan and became more calm and cooperative. Toradol given. Pt comfortable. Advised her to f/u with orthopedist who  will be scheduling surgery. Workup negative. Stable for d/c. Return precautions given. Patient states understanding of treatment care plan and is agreeable.  Illene Labrador, PA-C 10/07/13 1244

## 2013-10-13 ENCOUNTER — Encounter: Payer: Self-pay | Admitting: Family

## 2013-10-13 ENCOUNTER — Ambulatory Visit (INDEPENDENT_AMBULATORY_CARE_PROVIDER_SITE_OTHER): Payer: 59 | Admitting: Family

## 2013-10-13 VITALS — BP 100/60 | HR 77 | Temp 98.1°F | Resp 16 | Ht 62.0 in | Wt 101.1 lb

## 2013-10-13 DIAGNOSIS — R569 Unspecified convulsions: Secondary | ICD-10-CM

## 2013-10-13 DIAGNOSIS — M25519 Pain in unspecified shoulder: Secondary | ICD-10-CM

## 2013-10-13 DIAGNOSIS — F445 Conversion disorder with seizures or convulsions: Secondary | ICD-10-CM

## 2013-10-13 DIAGNOSIS — M25511 Pain in right shoulder: Secondary | ICD-10-CM

## 2013-10-13 MED ORDER — ONDANSETRON HCL 4 MG PO TABS: 4.0000 mg | ORAL_TABLET | Freq: Three times a day (TID) | ORAL | Status: DC | PRN

## 2013-10-13 MED ORDER — OXYCODONE-ACETAMINOPHEN 5-325 MG PO TABS: 1.0000 | ORAL_TABLET | Freq: Three times a day (TID) | ORAL | Status: DC | PRN

## 2013-10-13 MED ORDER — CITALOPRAM HYDROBROMIDE 20 MG PO TABS: 20.0000 mg | ORAL_TABLET | Freq: Every day | ORAL | Status: DC

## 2013-10-13 NOTE — Progress Notes (Signed)
Subjective:    Patient ID: Ruth Bell, female    DOB: [birth year], 53 y.o.   MRN: [id number]  HPI  Ms.  Bell is a 53 yr old female who presents today with her husband for ED follow up. She was seen on 7/22 in the ED due to right shoulder pain and episode of LOC which she attributed to pain. Per patient, her daughter witnessed her syncopal event and there was no obvious seizure activity observed. ED records are reviewed. She had neg tryponin in ED. She was ultimately discharged home.   Today she reports that right shoulder pain is severe. She reports that her pain is similar to the pain that she experienced when she had a left RTC tear which was subsequently repaired by Dr. Lorin Mercy.  She saw Dr. Barbaraann Barthel earlier this month (sports medicine) and he felt that her pain was most likely originating from the cervical disc pathology. Pt reports that she saw the neurosurgeon and was told that her pain is unlikely related to the cervical disc disease noted on MRI and more likely related to shoulder pathology.    Review of Systems See HPI  Past Medical History  Diagnosis Date  . Seizures     history of pseudoseizure disorder  . Migraine, unspecified, without mention of intractable migraine without mention of status migrainosus   . Anemia   . Menometrorrhagia 2006  . Uterine leiomyoma 2006  . Depression   . Hypertension   . Tubular adenoma of colon 05/2011    History   Social History  . Marital Status: Married    Spouse Name: Gwyndolyn Saxon    Number of Children: N/A  . Years of Education: N/A   Occupational History  . CMA    Social History Main Topics  . Smoking status: Current Every Day Smoker -- 40 years    Types: Cigarettes    Start date: 03/11/2013  . Smokeless tobacco: Never Used     Comment: 3-4 cigarettes daily  . Alcohol Use: No  . Drug Use: No  . Sexual Activity: Yes    Birth Control/ Protection: None   Other Topics Concern  . Not on file   Social History Narrative  . No  narrative on file    Past Surgical History  Procedure Laterality Date  . Appendectomy    . Bilateral salpingoophorectomy  04/06/04  . Abdominal hysterectomy  04/06/04  . Rotator cuff repair      Family History  Problem Relation Age of Onset  . Diabetes Other   . Hypertension Other   . Heart disease Mother   . Colon cancer Father     No Known Allergies  Current Outpatient Prescriptions on File Prior to Visit  Medication Sig Dispense Refill  . albuterol (PROVENTIL HFA;VENTOLIN HFA) 108 (90 BASE) MCG/ACT inhaler Inhale 2 puffs into the lungs every 6 (six) hours as needed for wheezing.  1 Inhaler  2  . ARIPiprazole (ABILIFY) 2 MG tablet Take 2 mg by mouth daily.      . cyclobenzaprine (FLEXERIL) 5 MG tablet One tablet by mouth 3 times daily for shoulder- may cause drowsiness.  30 tablet  0  . gabapentin (NEURONTIN) 300 MG capsule Take 600 mg by mouth 3 (three) times daily.      Marland Kitchen ibuprofen (ADVIL,MOTRIN) 800 MG tablet Take 1 tablet (800 mg total) by mouth 3 (three) times daily.  15 tablet  0  . LORazepam (ATIVAN) 0.5 MG tablet Take 1 tablet (0.5 mg  total) by mouth every 8 (eight) hours as needed for anxiety.  30 tablet  0  . methylPREDNISolone (MEDROL DOSEPAK) 4 MG tablet follow package directions  21 tablet  0  . metoprolol tartrate (LOPRESSOR) 25 MG tablet Take 25 mg by mouth 2 (two) times daily.      . Multiple Vitamin (MULITIVITAMIN WITH MINERALS) TABS Take 1 tablet by mouth daily.      . pantoprazole (PROTONIX) 40 MG tablet Take 1 tablet (40 mg total) by mouth daily.  30 tablet  2  . phenytoin (DILANTIN) 100 MG ER capsule Take 200 mg by mouth daily.       No current facility-administered medications on file prior to visit.    BP 100/60  Pulse 77  Temp(Src) 98.1 F (36.7 C) (Oral)  Resp 16  Ht 5\' 2"  (1.575 m)  Wt 101 lb 1.9 oz (45.868 kg)  BMI 18.49 kg/m2  SpO2 99%       Objective:   Physical Exam  Constitutional: She appears well-developed and well-nourished. No  distress.  Thin uncomfortable appearing AA female seated in chair.    Cardiovascular: Normal rate and regular rhythm.   No murmur heard. Pulmonary/Chest: Effort normal and breath sounds normal. No respiratory distress. She has no wheezes. She has no rales. She exhibits no tenderness.  Musculoskeletal:  R arm in sling.           Assessment & Plan:

## 2013-10-13 NOTE — Assessment & Plan Note (Signed)
Due to recent LOC, I have recommended to the pt that she arrange a follow up appointment with her neurologist.

## 2013-10-13 NOTE — Patient Instructions (Signed)
You may use percocet as needed.

## 2013-10-13 NOTE — Assessment & Plan Note (Addendum)
Uncontrolled. Pt desires second opinion.  Offered to order MRI of the shoulder and pt's husband said "that's already done, this is Bullshit!"  A copy of her neurosurgical consult is requested and obtained today during her visit. Dr. Hal Neer is recommending referral back to Dr. Lorin Mercy for shoulder eval and I have placed that referral.  Will also rx short course of prn percocet.

## 2013-10-13 NOTE — Progress Notes (Signed)
Pre visit review using our clinic review tool, if applicable. No additional management support is needed unless otherwise documented below in the visit note. 

## 2013-10-21 ENCOUNTER — Encounter: Payer: Self-pay | Admitting: Gastroenterology

## 2013-10-28 ENCOUNTER — Other Ambulatory Visit: Payer: Self-pay | Admitting: Orthopaedic Surgery

## 2013-10-28 DIAGNOSIS — M25511 Pain in right shoulder: Secondary | ICD-10-CM

## 2013-10-31 ENCOUNTER — Ambulatory Visit
Admission: RE | Admit: 2013-10-31 | Discharge: 2013-10-31 | Disposition: A | Discharge status: Home / Self Care | Payer: 59 | Source: Ambulatory Visit | Attending: Orthopaedic Surgery | Admitting: Orthopaedic Surgery

## 2013-10-31 DIAGNOSIS — M25511 Pain in right shoulder: Secondary | ICD-10-CM

## 2013-11-11 ENCOUNTER — Telehealth: Payer: Self-pay | Admitting: Family

## 2013-11-11 NOTE — Telephone Encounter (Signed)
Could you please contact pt and advise her that we do not have any openings at our office today.  Due to her symptoms, I would recommend that she proceed to the ED. If they feel she needs admission, then they will coordinate.

## 2013-11-11 NOTE — Telephone Encounter (Signed)
Notified pt and she voices understanding. 

## 2013-11-11 NOTE — Telephone Encounter (Signed)
Patient Information:  Caller Name: Edmonia  Phone: [phone]  Patient: Ruth, Bell  Gender: Female  DOB: 1961/01/21  Age: 53 Years  PCP: Debbrah Alar (Adults only)  Pregnant: No  Office Follow Up:  Does the office need to follow up with this patient?: Yes  Instructions For The Office: She has very high BP today, dizzy, vomiting for the last 3 days and requesting to be admitted to the hospital. No appointments available at the office.  RN Note:  Afebrile. LMP unk. Onset today, 11/11/2013 of HTN increased, 150's/101,dizzy lightheaded. She confirms she is taking her lopressor and other meds as ordered. She states for the last 3 days she has been Moderate vomiting a lot, X4/today, urinating "like a faucet". Pain in right eye started yesterday, 11/10/2013. Her husband will not be home with the car until 14:30 today. She actually requested, "I want them to admit me to the hospital". RN/CAN called office and was told to message Lennie Hummer, NP.   Symptoms  Reason For Call & Symptoms: HTN, dizzy, 178/106  Reviewed Health History In EMR: Yes  Reviewed Medications In EMR: Yes  Reviewed Allergies In EMR: Yes  Reviewed Surgeries / Procedures: Yes  Date of Onset of Symptoms: 11/11/2013 OB / GYN:  LMP: Unknown  Guideline(s) Used:  Vomiting  Disposition Per Guideline:   See Today in Office  Reason For Disposition Reached:   Vomiting lasts > 48 hours  Advice Given:  Reassurance:  Vomiting can be caused by many types of illnesses. It can be caused by a stomach flu virus. It can be caused by eating or drinking something that disagreed with your stomach.  You can treat vomiting, even if there is mild dehydration, at home.  Clear Liquids:  Sip water or a rehydration drink (e.g., Gatorade or Powerade).  Other options: 1/2 strength flat lemon-lime soda or ginger ale.  Clear Liquids:  Sip water or a rehydration drink (e.g., Gatorade or Powerade).  Other options: 1/2 strength flat  lemon-lime soda or ginger ale.  After 4 hours without vomiting, increase the amount.  Avoid Nonprescription Medicines:  Stop all nonprescription medicines for 24 hours (Reason: they may make vomiting worse).  For Non-stop Vomiting, Try Sleeping:  Try to go to sleep (Reason: sleep often empties the stomach and relieves the need to vomit).  When you awaken, resume drinking liquids. Water works best initially.  Expected Course:  Vomiting from viral gastritis usually stops in 12 to 48 hours.  People with moderate to severe dehydration may need medical care. signs of this include very dry mouth, dizziness, weakness, and decreased urination.  Call Back If:  Vomiting lasts for more than 2 days (48 hours)  Signs of dehydration occur  You become worse.  Patient Will Follow Care Advice:  YES

## 2013-11-11 NOTE — Telephone Encounter (Signed)
Patient Information:  Caller Name: Carlita  Phone: [phone]  Patient: Ruth Bell, Ruth Bell  Gender: Female  DOB: 1960-11-26  Age: 53 Years  PCP: Debbrah Alar (Adults only)  Pregnant: No  Office Follow Up:  Does the office need to follow up with this patient?: Yes  Instructions For The Office: She has very high BP today, dizzy, vomiting for the last 3 days and requesting to be admitted to the hospital. No appointments available at the office.  RN Note:  Afebrile. LMP unk. Onset today, 11/11/2013 of HTN increased, 150's/101,dizzy lightheaded. She confirms she is taking her lopressor and other meds as ordered. She states for the last 3 days she has been Moderate vomiting a lot, X4/today, urinating "like a faucet". Pain in right eye started yesterday, 11/10/2013. Her husband will not be home with the car until 14:30 today. She actually requested, "I want them to admit me to the hospital". RN/CAN called office and was told to message Lennie Hummer, NP.   Symptoms  Reason For Call & Symptoms: HTN, dizzy, 178/106  Reviewed Health History In EMR: Yes  Reviewed Medications In EMR: Yes  Reviewed Allergies In EMR: Yes  Reviewed Surgeries / Procedures: Yes  Date of Onset of Symptoms: 11/11/2013 OB / GYN:  LMP: Unknown  Guideline(s) Used:  Vomiting  Disposition Per Guideline:   See Today in Office  Reason For Disposition Reached:   Vomiting lasts > 48 hours  Advice Given:  Reassurance:  Vomiting can be caused by many types of illnesses. It can be caused by a stomach flu virus. It can be caused by eating or drinking something that disagreed with your stomach.  You can treat vomiting, even if there is mild dehydration, at home.  Clear Liquids:  Sip water or a rehydration drink (e.g., Gatorade or Powerade).  Other options: 1/2 strength flat lemon-lime soda or ginger ale.  Clear Liquids:  Sip water or a rehydration drink (e.g., Gatorade or Powerade).  Other options: 1/2 strength flat  lemon-lime soda or ginger ale.  After 4 hours without vomiting, increase the amount.  Avoid Nonprescription Medicines:  Stop all nonprescription medicines for 24 hours (Reason: they may make vomiting worse).  For Non-stop Vomiting, Try Sleeping:  Try to go to sleep (Reason: sleep often empties the stomach and relieves the need to vomit).  When you awaken, resume drinking liquids. Water works best initially.  Expected Course:  Vomiting from viral gastritis usually stops in 12 to 48 hours.  People with moderate to severe dehydration may need medical care. signs of this include very dry mouth, dizziness, weakness, and decreased urination.  Call Back If:  Vomiting lasts for more than 2 days (48 hours)  Signs of dehydration occur  You become worse.  Patient Will Follow Care Advice:  YES

## 2013-11-19 ENCOUNTER — Ambulatory Visit (INDEPENDENT_AMBULATORY_CARE_PROVIDER_SITE_OTHER): Payer: 59 | Admitting: Physician Assistant

## 2013-11-19 ENCOUNTER — Encounter: Payer: Self-pay | Admitting: Physician Assistant

## 2013-11-19 VITALS — BP 115/70 | HR 72 | Temp 98.0°F | Ht 64.5 in | Wt 102.0 lb

## 2013-11-19 DIAGNOSIS — Z01818 Encounter for other preprocedural examination: Secondary | ICD-10-CM

## 2013-11-19 NOTE — Patient Instructions (Signed)
Please go to the lab.  I will call you with your results and to let you know when your medical clearance has been granted and forms have been faxed.  You will also have a copy of this information here at the office to pick up if you'd like.  Good luck with your surgery.

## 2013-11-19 NOTE — Assessment & Plan Note (Signed)
EKG reveals normal sinus rhythm. Vital signs within normal limits. Physical exam unremarkable. Will obtain labs. If all looks good, will fax over medical clearance for surgery. Will inform patient when a this has been done.

## 2013-11-19 NOTE — Progress Notes (Signed)
Pre visit review using our clinic review tool, if applicable. No additional management support is needed unless otherwise documented below in the visit note. 

## 2013-11-19 NOTE — Progress Notes (Signed)
Patient presents to clinic today for preoperative clearance. Patient has upcoming rotator cuff repair of her right shoulder. Patient is followed by [facility] orthopedics. Patient denies any acute concerns presently.  Past Medical History  Diagnosis Date  . Seizures     history of pseudoseizure disorder  . Migraine, unspecified, without mention of intractable migraine without mention of status migrainosus   . Anemia   . Menometrorrhagia 2006  . Uterine leiomyoma 2006  . Depression   . Hypertension   . Tubular adenoma of colon 05/2011    Current Outpatient Prescriptions on File Prior to Visit  Medication Sig Dispense Refill  . albuterol (PROVENTIL HFA;VENTOLIN HFA) 108 (90 BASE) MCG/ACT inhaler Inhale 2 puffs into the lungs every 6 (six) hours as needed for wheezing.  1 Inhaler  2  . ARIPiprazole (ABILIFY) 2 MG tablet Take 2 mg by mouth daily.      . citalopram (CELEXA) 20 MG tablet Take 1 tablet (20 mg total) by mouth daily.  30 tablet  3  . cyclobenzaprine (FLEXERIL) 5 MG tablet One tablet by mouth 3 times daily for shoulder- may cause drowsiness.  30 tablet  0  . gabapentin (NEURONTIN) 300 MG capsule Take 600 mg by mouth 3 (three) times daily.      Marland Kitchen ibuprofen (ADVIL,MOTRIN) 800 MG tablet Take 1 tablet (800 mg total) by mouth 3 (three) times daily.  15 tablet  0  . LORazepam (ATIVAN) 0.5 MG tablet Take 1 tablet (0.5 mg total) by mouth every 8 (eight) hours as needed for anxiety.  30 tablet  0  . methylPREDNISolone (MEDROL DOSEPAK) 4 MG tablet follow package directions  21 tablet  0  . metoprolol tartrate (LOPRESSOR) 25 MG tablet Take 25 mg by mouth 2 (two) times daily.      . Multiple Vitamin (MULITIVITAMIN WITH MINERALS) TABS Take 1 tablet by mouth daily.      . ondansetron (ZOFRAN) 4 MG tablet Take 1 tablet (4 mg total) by mouth every 8 (eight) hours as needed for nausea or vomiting.  20 tablet  0  . oxyCODONE-acetaminophen (ROXICET) 5-325 MG per tablet Take 1 tablet by mouth every 8  (eight) hours as needed for severe pain.  20 tablet  0  . pantoprazole (PROTONIX) 40 MG tablet Take 1 tablet (40 mg total) by mouth daily.  30 tablet  2  . phenytoin (DILANTIN) 100 MG ER capsule Take 200 mg by mouth daily.       No current facility-administered medications on file prior to visit.    No Known Allergies  Family History  Problem Relation Age of Onset  . Diabetes Other   . Hypertension Other   . Heart disease Mother   . Colon cancer Father     History   Social History  . Marital Status: Married    Spouse Name: Gwyndolyn Saxon    Number of Children: N/A  . Years of Education: N/A   Occupational History  . CMA    Social History Main Topics  . Smoking status: Current Every Day Smoker -- 40 years    Types: Cigarettes    Start date: 03/11/2013  . Smokeless tobacco: Never Used     Comment: 3-4 cigarettes daily  . Alcohol Use: No  . Drug Use: No  . Sexual Activity: Yes    Birth Control/ Protection: None   Other Topics Concern  . None   Social History Narrative  . None   Review of Systems - See HPI.  All other ROS are negative.  BP 115/70  Pulse 72  Temp(Src) 98 F (36.7 C)  Ht 5' 4.5" (1.638 m)  Wt 102 lb (46.267 kg)  BMI 17.24 kg/m2  SpO2 99%  Physical Exam  Vitals reviewed. Constitutional: She is oriented to person, place, and time and well-developed, well-nourished, and in no distress.  HENT:  Head: Normocephalic and atraumatic.  Eyes: Conjunctivae are normal. Pupils are equal, round, and reactive to light.  Neck: Neck supple.  Cardiovascular: Normal rate, regular rhythm, normal heart sounds and intact distal pulses.   Pulmonary/Chest: Effort normal and breath sounds normal. No respiratory distress. She has no wheezes. She has no rales. She exhibits no tenderness.  Abdominal: Soft. Bowel sounds are normal. She exhibits no distension. There is no tenderness.  Neurological: She is alert and oriented to person, place, and time.  Skin: Skin is warm and  dry. No rash noted.  Psychiatric: Affect normal.    Recent Results (from the past 2160 hour(s))  HEPATIC FUNCTION PANEL     Status: None   Collection Time    09/23/13  4:25 PM      Result Value Ref Range   Total Bilirubin 0.4  0.2 - 1.2 mg/dL   Bilirubin, Direct 0.1  0.0 - 0.3 mg/dL   Indirect Bilirubin 0.3  0.2 - 1.2 mg/dL   Alkaline Phosphatase 61  39 - 117 U/L   AST 18  0 - 37 U/L   ALT 16  0 - 35 U/L   Total Protein 7.3  6.0 - 8.3 g/dL   Albumin 4.6  3.5 - 5.2 g/dL  LIPASE     Status: None   Collection Time    09/23/13  4:25 PM      Result Value Ref Range   Lipase 34  0 - 75 U/L  CBC WITH DIFFERENTIAL     Status: None   Collection Time    09/23/13  4:25 PM      Result Value Ref Range   WBC 9.0  4.0 - 10.5 K/uL   RBC 5.09  3.87 - 5.11 MIL/uL   Hemoglobin 14.2  12.0 - 15.0 g/dL   HCT 41.3  36.0 - 46.0 %   MCV 81.1  78.0 - 100.0 fL   MCH 27.9  26.0 - 34.0 pg   MCHC 34.4  30.0 - 36.0 g/dL   RDW 15.1  11.5 - 15.5 %   Platelets 304  150 - 400 K/uL   Neutrophils Relative % 52  43 - 77 %   Neutro Abs 4.7  1.7 - 7.7 K/uL   Lymphocytes Relative 42  12 - 46 %   Lymphs Abs 3.8  0.7 - 4.0 K/uL   Monocytes Relative 5  3 - 12 %   Monocytes Absolute 0.5  0.1 - 1.0 K/uL   Eosinophils Relative 1  0 - 5 %   Eosinophils Absolute 0.1  0.0 - 0.7 K/uL   Basophils Relative 0  0 - 1 %   Basophils Absolute 0.0  0.0 - 0.1 K/uL   Smear Review Criteria for review not met    TROPONIN I     Status: None   Collection Time    09/25/13  3:38 PM      Result Value Ref Range   Troponin I <0.30  <0.30 ng/mL   Comment:            Due to the release kinetics of cTnI,  a negative result within the first hours     of the onset of symptoms does not rule out     myocardial infarction with certainty.     If myocardial infarction is still suspected,     repeat the test at appropriate intervals.  COMPREHENSIVE METABOLIC PANEL     Status: Abnormal   Collection Time    09/25/13  3:38 PM       Result Value Ref Range   Sodium 140  137 - 147 mEq/L   Potassium 4.4  3.7 - 5.3 mEq/L   Comment: MODERATE HEMOLYSIS     HEMOLYSIS AT THIS LEVEL MAY AFFECT RESULT   Chloride 99  96 - 112 mEq/L   CO2 25  19 - 32 mEq/L   Glucose, Bld 97  70 - 99 mg/dL   BUN 7  6 - 23 mg/dL   Creatinine, Ser 0.58  0.50 - 1.10 mg/dL   Calcium 9.7  8.4 - 10.5 mg/dL   Total Protein 8.0  6.0 - 8.3 g/dL   Albumin 4.0  3.5 - 5.2 g/dL   AST 25  0 - 37 U/L   Comment: MODERATE HEMOLYSIS     HEMOLYSIS AT THIS LEVEL MAY AFFECT RESULT   ALT 18  0 - 35 U/L   Comment: MODERATE HEMOLYSIS     HEMOLYSIS AT THIS LEVEL MAY AFFECT RESULT   Alkaline Phosphatase 68  39 - 117 U/L   Total Bilirubin 0.4  0.3 - 1.2 mg/dL   GFR calc non Af Amer >90  >90 mL/min   GFR calc Af Amer >90  >90 mL/min   Comment: (NOTE)     The eGFR has been calculated using the CKD EPI equation.     This calculation has not been validated in all clinical situations.     eGFR's persistently <90 mL/min signify possible Chronic Kidney     Disease.   Anion gap 16 (*) 5 - 15  CBC WITH DIFFERENTIAL     Status: Abnormal   Collection Time    09/25/13  3:38 PM      Result Value Ref Range   WBC 7.7  4.0 - 10.5 K/uL   RBC 5.23 (*) 3.87 - 5.11 MIL/uL   Hemoglobin 14.3  12.0 - 15.0 g/dL   HCT 42.1  36.0 - 46.0 %   MCV 80.5  78.0 - 100.0 fL   MCH 27.3  26.0 - 34.0 pg   MCHC 34.0  30.0 - 36.0 g/dL   RDW 13.9  11.5 - 15.5 %   Platelets 337  150 - 400 K/uL   Neutrophils Relative % 71  43 - 77 %   Neutro Abs 5.5  1.7 - 7.7 K/uL   Lymphocytes Relative 23  12 - 46 %   Lymphs Abs 1.7  0.7 - 4.0 K/uL   Monocytes Relative 5  3 - 12 %   Monocytes Absolute 0.4  0.1 - 1.0 K/uL   Eosinophils Relative 1  0 - 5 %   Eosinophils Absolute 0.1  0.0 - 0.7 K/uL   Basophils Relative 0  0 - 1 %   Basophils Absolute 0.0  0.0 - 0.1 K/uL  PHENYTOIN LEVEL, TOTAL     Status: Abnormal   Collection Time    09/25/13  3:38 PM      Result Value Ref Range   Phenytoin Lvl <2.5  (*) 10.0 - 20.0 ug/mL   Comment: Performed at [facility],  BLOOD     Status: None   Collection Time    09/25/13  3:38 PM      Result Value Ref Range   Lipase 32  11 - 59 U/L  URINE RAPID DRUG SCREEN (HOSP PERFORMED)     Status: Abnormal   Collection Time    09/25/13  4:02 PM      Result Value Ref Range   Opiates NONE DETECTED  NONE DETECTED   Cocaine NONE DETECTED  NONE DETECTED   Benzodiazepines NONE DETECTED  NONE DETECTED   Amphetamines NONE DETECTED  NONE DETECTED   Tetrahydrocannabinol POSITIVE (*) NONE DETECTED   Barbiturates NONE DETECTED  NONE DETECTED   Comment:            DRUG SCREEN FOR MEDICAL PURPOSES     ONLY.  IF CONFIRMATION IS NEEDED     FOR ANY PURPOSE, NOTIFY LAB     WITHIN 5 DAYS.                LOWEST DETECTABLE LIMITS     FOR URINE DRUG SCREEN     Drug Class       Cutoff (ng/mL)     Amphetamine      1000     Barbiturate      200     Benzodiazepine   540     Tricyclics       086     Opiates          300     Cocaine          300     THC              50  CBC     Status: None   Collection Time    10/07/13 11:17 AM      Result Value Ref Range   WBC 8.9  4.0 - 10.5 K/uL   RBC 5.07  3.87 - 5.11 MIL/uL   Hemoglobin 13.9  12.0 - 15.0 g/dL   HCT 41.1  36.0 - 46.0 %   MCV 81.1  78.0 - 100.0 fL   MCH 27.4  26.0 - 34.0 pg   MCHC 33.8  30.0 - 36.0 g/dL   RDW 14.2  11.5 - 15.5 %   Platelets 314  150 - 400 K/uL  BASIC METABOLIC PANEL     Status: Abnormal   Collection Time    10/07/13 11:17 AM      Result Value Ref Range   Sodium 140  137 - 147 mEq/L   Potassium 4.1  3.7 - 5.3 mEq/L   Chloride 101  96 - 112 mEq/L   CO2 26  19 - 32 mEq/L   Glucose, Bld 130 (*) 70 - 99 mg/dL   BUN 15  6 - 23 mg/dL   Creatinine, Ser 0.46 (*) 0.50 - 1.10 mg/dL   Calcium 9.6  8.4 - 10.5 mg/dL   GFR calc non Af Amer >90  >90 mL/min   GFR calc Af Amer >90  >90 mL/min   Comment: (NOTE)     The eGFR has been calculated using the CKD EPI equation.     This  calculation has not been validated in all clinical situations.     eGFR's persistently <90 mL/min signify possible Chronic Kidney     Disease.   Anion gap 13  5 - 15  I-STAT TROPOININ, ED     Status: None   Collection Time  10/07/13 11:26 AM      Result Value Ref Range   Troponin i, poc 0.00  0.00 - 0.08 ng/mL   Comment 3            Comment: Due to the release kinetics of cTnI,     a negative result within the first hours     of the onset of symptoms does not rule out     myocardial infarction with certainty.     If myocardial infarction is still suspected,     repeat the test at appropriate intervals.    Assessment/Plan: Preoperative clearance EKG reveals normal sinus rhythm. Vital signs within normal limits. Physical exam unremarkable. Will obtain labs. If all looks good, will fax over medical clearance for surgery. Will inform patient when a this has been done.

## 2013-11-20 LAB — CBC
HCT: 38.9 % (ref 36.0–46.0)
Hemoglobin: 12.6 g/dL (ref 12.0–15.0)
MCHC: 32.3 g/dL (ref 30.0–36.0)
MCV: 85.3 fl (ref 78.0–100.0)
Platelets: 282 10*3/uL (ref 150.0–400.0)
RBC: 4.56 Mil/uL (ref 3.87–5.11)
RDW: 15 % (ref 11.5–15.5)
WBC: 8.7 10*3/uL (ref 4.0–10.5)

## 2013-11-20 LAB — COMPREHENSIVE METABOLIC PANEL
ALT: 21 U/L (ref 0–35)
AST: 24 U/L (ref 0–37)
Albumin: 4.2 g/dL (ref 3.5–5.2)
Alkaline Phosphatase: 71 U/L (ref 39–117)
BUN: 11 mg/dL (ref 6–23)
CO2: 34 mEq/L — ABNORMAL HIGH (ref 19–32)
Calcium: 9.5 mg/dL (ref 8.4–10.5)
Chloride: 104 mEq/L (ref 96–112)
Creatinine, Ser: 0.6 mg/dL (ref 0.4–1.2)
GFR: 128.45 mL/min (ref >60.00)
Glucose, Bld: 53 mg/dL — ABNORMAL LOW (ref 70–99)
Potassium: 3.5 mEq/L (ref 3.5–5.1)
Sodium: 143 mEq/L (ref 135–145)
Total Bilirubin: 0.5 mg/dL (ref 0.2–1.2)
Total Protein: 7.8 g/dL (ref 6.0–8.3)

## 2013-11-20 LAB — URINALYSIS, ROUTINE W REFLEX MICROSCOPIC
Bilirubin Urine: NEGATIVE
Ketones, ur: NEGATIVE
Leukocytes, UA: NEGATIVE
Nitrite: NEGATIVE
Specific Gravity, Urine: 1.02 (ref 1.000–1.030)
Total Protein, Urine: NEGATIVE
Urine Glucose: NEGATIVE
Urobilinogen, UA: 0.2 (ref 0.0–1.0)
pH: 6 (ref 5.0–8.0)

## 2013-11-20 LAB — PROTIME-INR
INR: 1 ratio (ref 0.8–1.0)
Prothrombin Time: 11.2 s (ref 9.6–13.1)

## 2013-11-24 ENCOUNTER — Encounter: Payer: Self-pay | Admitting: Physician Assistant

## 2013-12-22 ENCOUNTER — Encounter: Payer: Self-pay | Admitting: Family Medicine

## 2013-12-22 ENCOUNTER — Ambulatory Visit (INDEPENDENT_AMBULATORY_CARE_PROVIDER_SITE_OTHER): Payer: 59 | Admitting: Family Medicine

## 2013-12-22 VITALS — BP 120/71 | HR 86 | Temp 98.2°F | Ht 62.0 in | Wt 102.2 lb

## 2013-12-22 DIAGNOSIS — I10 Essential (primary) hypertension: Secondary | ICD-10-CM

## 2013-12-22 DIAGNOSIS — K5900 Constipation, unspecified: Secondary | ICD-10-CM

## 2013-12-22 DIAGNOSIS — F418 Other specified anxiety disorders: Secondary | ICD-10-CM

## 2013-12-22 DIAGNOSIS — B353 Tinea pedis: Secondary | ICD-10-CM

## 2013-12-22 DIAGNOSIS — R634 Abnormal weight loss: Secondary | ICD-10-CM

## 2013-12-22 DIAGNOSIS — M542 Cervicalgia: Secondary | ICD-10-CM

## 2013-12-22 DIAGNOSIS — R319 Hematuria, unspecified: Secondary | ICD-10-CM

## 2013-12-22 DIAGNOSIS — G40909 Epilepsy, unspecified, not intractable, without status epilepticus: Secondary | ICD-10-CM

## 2013-12-22 DIAGNOSIS — M25511 Pain in right shoulder: Secondary | ICD-10-CM

## 2013-12-22 DIAGNOSIS — R55 Syncope and collapse: Secondary | ICD-10-CM

## 2013-12-22 DIAGNOSIS — R52 Pain, unspecified: Secondary | ICD-10-CM

## 2013-12-22 MED ORDER — TRAMADOL HCL 50 MG PO TABS: 50.0000 mg | ORAL_TABLET | Freq: Three times a day (TID) | ORAL | Status: DC | PRN

## 2013-12-22 MED ORDER — CITALOPRAM HYDROBROMIDE 20 MG PO TABS: 20.0000 mg | ORAL_TABLET | Freq: Every day | ORAL | Status: DC

## 2013-12-22 MED ORDER — LORAZEPAM 0.5 MG PO TABS: 0.5000 mg | ORAL_TABLET | Freq: Three times a day (TID) | ORAL | Status: DC | PRN

## 2013-12-22 MED ORDER — ONDANSETRON HCL 4 MG PO TABS: 4.0000 mg | ORAL_TABLET | Freq: Three times a day (TID) | ORAL | Status: DC | PRN

## 2013-12-22 NOTE — Assessment & Plan Note (Signed)
Summer 2015, occurred with increased pain 2 x

## 2013-12-22 NOTE — Assessment & Plan Note (Signed)
c4-5

## 2013-12-22 NOTE — Assessment & Plan Note (Signed)
Was greatly stressed when major stress occurs

## 2013-12-22 NOTE — Assessment & Plan Note (Signed)
Well controlled, no changes to meds. Encouraged heart healthy diet such as the DASH diet and exercise as tolerated.  °

## 2013-12-22 NOTE — Patient Instructions (Signed)
Encouraged increased hydration and fiber in diet. Daily probiotics. If bowels not moving can use MOM 2 tbls po in 4 oz of warm prune juice by mouth every 2-3 days. If no results then repeat in 4 hours with  Dulcolax suppository pr, may repeat again in 4 more hours as needed. Seek care if symptoms worsen. Consider daily Miralax and/or Dulcolax if symptoms persist.    For the feet soak in 1/2 warm water and 1/2 distilled white vinegar and then apply Lamisil or Clotrimazole otc twice a day    Hematuria Hematuria is blood in your urine. It can be caused by a bladder infection, kidney infection, prostate infection, kidney stone, or cancer of your urinary tract. Infections can usually be treated with medicine, and a kidney stone usually will pass through your urine. If neither of these is the cause of your hematuria, further workup to find out the reason may be needed. It is very important that you tell your health care provider about any blood you see in your urine, even if the blood stops without treatment or happens without causing pain. Blood in your urine that happens and then stops and then happens again can be a symptom of a very serious condition. Also, pain is not a symptom in the initial stages of many urinary cancers. HOME CARE INSTRUCTIONS   Drink lots of fluid, 3-4 quarts a day. If you have been diagnosed with an infection, cranberry juice is especially recommended, in addition to large amounts of water.  Avoid caffeine, tea, and carbonated beverages because they tend to irritate the bladder.  Avoid alcohol because it may irritate the prostate.  Take all medicines as directed by your health care provider.  If you were prescribed an antibiotic medicine, finish it all even if you start to feel better.  If you have been diagnosed with a kidney stone, follow your health care provider's instructions regarding straining your urine to catch the stone.  Empty your bladder often. Avoid holding  urine for long periods of time.  After a bowel movement, women should cleanse front to back. Use each tissue only once.  Empty your bladder before and after sexual intercourse if you are a female. SEEK MEDICAL CARE IF:  You develop back pain.  You have a fever.  You have a feeling of sickness in your stomach (nausea) or vomiting.  Your symptoms are not better in 3 days. Return sooner if you are getting worse. SEEK IMMEDIATE MEDICAL CARE IF:   You develop severe vomiting and are unable to keep the medicine down.  You develop severe back or abdominal pain despite taking your medicines.  You begin passing a large amount of blood or clots in your urine.  You feel extremely weak or faint, or you pass out. MAKE SURE YOU:   Understand these instructions.  Will watch your condition.  Will get help right away if you are not doing well or get worse. Document Released: 03/05/2005 Document Revised: 07/20/2013 Document Reviewed: 11/03/2012 [facility] Patient Information 2015 [place], [place]. This information is not intended to replace advice given to you by your health care provider. Make sure you discuss any questions you have with your health care provider.

## 2013-12-22 NOTE — Assessment & Plan Note (Signed)
Rotator cuff injury s/p repair

## 2013-12-22 NOTE — Assessment & Plan Note (Signed)
Left 2nd toe itches

## 2013-12-22 NOTE — Progress Notes (Signed)
Pre visit review using our clinic review tool, if applicable. No additional management support is needed unless otherwise documented below in the visit note. 

## 2013-12-23 LAB — URINALYSIS, ROUTINE W REFLEX MICROSCOPIC
Bilirubin Urine: NEGATIVE
Ketones, ur: NEGATIVE
Leukocytes, UA: NEGATIVE
Nitrite: NEGATIVE
Specific Gravity, Urine: 1.025 (ref 1.000–1.030)
Total Protein, Urine: NEGATIVE
Urine Glucose: NEGATIVE
Urobilinogen, UA: 0.2 (ref 0.0–1.0)
pH: 6 (ref 5.0–8.0)

## 2013-12-23 LAB — URINE CULTURE
Colony Count: NO GROWTH
Organism ID, Bacteria: NO GROWTH

## 2013-12-27 ENCOUNTER — Encounter: Payer: Self-pay | Admitting: Family Medicine

## 2013-12-27 DIAGNOSIS — K5900 Constipation, unspecified: Secondary | ICD-10-CM | POA: Insufficient documentation

## 2013-12-27 HISTORY — DX: Constipation, unspecified: K59.00

## 2013-12-27 NOTE — Progress Notes (Signed)
Patient ID: Ruth Bell, female   DOB: 08-Jan-1961, 53 y.o.   MRN: [id number] Ruth Bell [id number] 1960-07-04 12/27/2013      Progress Note-Follow Up  Subjective  Chief Complaint  Chief Complaint  Patient presents with  . Establish Care    new patient/ transfer from [facility]    HPI  Patient is a 53 year old female in today for routine medical care. Patient is in today to establish care she's recently had her rotator cuff and a painful and she is in medications due to constipation and that is slowly improving. She is noting worsening pain in her neck. Notes previously been told she had a bulging disc at C5-6 and is now having some trouble with neck pain and radicular symptoms into her third fourth fingers. No recent trauma. Is noting some hematuria, urinary frequency and discomfort. Denies CP/palp/SOB/HA/congestion/fevers c/o. Taking meds as prescribed  Past Medical History  Diagnosis Date  . Seizures     history of pseudoseizure disorder  . Migraine, unspecified, without mention of intractable migraine without mention of status migrainosus   . Anemia   . Menometrorrhagia 2006  . Uterine leiomyoma 2006  . Depression   . Hypertension   . Tubular adenoma of colon 05/2011  . Constipation 12/27/2013    Past Surgical History  Procedure Laterality Date  . Appendectomy    . Bilateral salpingoophorectomy  04/06/04  . Abdominal hysterectomy  04/06/04  . Rotator cuff repair      Family History  Problem Relation Age of Onset  . Heart disease Mother   . Heart attack Mother   . Cancer Father     colon  . Hypertension Father   . Cancer Sister     brain  . Cancer Brother   . Hypertension Sister   . Cancer Sister     unsure of type  . Diabetes Sister     type 2  . Heart attack Sister   . Hypertension Brother     History   Social History  . Marital Status: Married    Spouse Name: Gwyndolyn Saxon    Number of Children: N/A  . Years of Education: N/A   Occupational History  .  CMA    Social History Main Topics  . Smoking status: Former Smoker -- 40 years    Types: Cigarettes    Start date: 11/17/2013  . Smokeless tobacco: Never Used     Comment: 3-4 cigarettes daily  . Alcohol Use: No  . Drug Use: No  . Sexual Activity: Yes    Birth Control/ Protection: None     Comment: lives with husband, no dietary restrictions.    Other Topics Concern  . Not on file   Social History Narrative  . No narrative on file    Current Outpatient Prescriptions on File Prior to Visit  Medication Sig Dispense Refill  . albuterol (PROVENTIL HFA;VENTOLIN HFA) 108 (90 BASE) MCG/ACT inhaler Inhale 2 puffs into the lungs every 6 (six) hours as needed for wheezing.  1 Inhaler  2  . ARIPiprazole (ABILIFY) 2 MG tablet Take 2 mg by mouth daily.      . cyclobenzaprine (FLEXERIL) 5 MG tablet One tablet by mouth 3 times daily for shoulder- may cause drowsiness.  30 tablet  0  . gabapentin (NEURONTIN) 300 MG capsule Take 600 mg by mouth 3 (three) times daily.      Marland Kitchen ibuprofen (ADVIL,MOTRIN) 800 MG tablet Take 1 tablet (800 mg total) by mouth  3 (three) times daily.  15 tablet  0  . metoprolol tartrate (LOPRESSOR) 25 MG tablet Take 25 mg by mouth 2 (two) times daily.      . Multiple Vitamin (MULITIVITAMIN WITH MINERALS) TABS Take 1 tablet by mouth daily.      . pantoprazole (PROTONIX) 40 MG tablet Take 1 tablet (40 mg total) by mouth daily.  30 tablet  2  . phenytoin (DILANTIN) 100 MG ER capsule Take 200 mg by mouth daily.       No current facility-administered medications on file prior to visit.    No Known Allergies  Review of Systems  Review of Systems  Constitutional: Negative for fever and malaise/fatigue.  HENT: Negative for congestion.   Eyes: Negative for discharge.  Respiratory: Negative for shortness of breath.   Cardiovascular: Negative for chest pain, palpitations and leg swelling.  Gastrointestinal: Positive for constipation. Negative for nausea, abdominal pain and  diarrhea.  Genitourinary: Negative for dysuria.  Musculoskeletal: Positive for joint pain. Negative for falls.  Skin: Negative for rash.  Neurological: Negative for loss of consciousness and headaches.  Endo/Heme/Allergies: Negative for polydipsia.  Psychiatric/Behavioral: Negative for depression and suicidal ideas. The patient is not nervous/anxious and does not have insomnia.     Objective  BP 120/71  Pulse 86  Temp(Src) 98.2 F (36.8 C) (Oral)  Ht 5\' 2"  (1.575 m)  Wt 102 lb 3.2 oz (46.358 kg)  BMI 18.69 kg/m2  SpO2 100%  Physical Exam  Physical Exam  Constitutional: She is oriented to person, place, and time and well-developed, well-nourished, and in no distress. No distress.  HENT:  Head: Normocephalic and atraumatic.  Eyes: Conjunctivae are normal.  Neck: Neck supple. No thyromegaly present.  Cardiovascular: Normal rate, regular rhythm and normal heart sounds.   No murmur heard. Pulmonary/Chest: Effort normal. She has no wheezes. She has no rales.  Abdominal: She exhibits no distension and no mass.  Musculoskeletal: She exhibits no edema.  Lymphadenopathy:    She has no cervical adenopathy.  Neurological: She is alert and oriented to person, place, and time.  Skin: Skin is warm and dry. No rash noted. She is not diaphoretic.  Psychiatric: Memory, affect and judgment normal.    Lab Results  Component Value Date   TSH 0.797 02/24/2012   Lab Results  Component Value Date   WBC 8.7 11/19/2013   HGB 12.6 11/19/2013   HCT 38.9 11/19/2013   MCV 85.3 11/19/2013   PLT 282.0 11/19/2013   Lab Results  Component Value Date   CREATININE 0.6 11/19/2013   BUN 11 11/19/2013   NA 143 11/19/2013   K 3.5 11/19/2013   CL 104 11/19/2013   CO2 34* 11/19/2013   Lab Results  Component Value Date   ALT 21 11/19/2013   AST 24 11/19/2013   ALKPHOS 71 11/19/2013   BILITOT 0.5 11/19/2013   No results found for this basename: CHOL   No results found for this basename: HDL   No results found for this  basename: LDLCALC   No results found for this basename: TRIG   No results found for this basename: CHOLHDL     Assessment & Plan  WEIGHT LOSS Was greatly stressed when major stress occurs  TINEA PEDIS Left 2nd toe itches  SYNCOPE Summer 2015, occurred with increased pain 2 x  Shoulder pain Rotator cuff injury s/p repair  HTN (hypertension) Well controlled, no changes to meds. Encouraged heart healthy diet such as the DASH diet and exercise  as tolerated.   Neck pain c4-5  Pain in joint, shoulder region Found to be a rotator cuff injury requiring repair, surgery by [facility]. May try topical treatments such as Salon  Pas prn  Seizure disorder No recent activity

## 2013-12-27 NOTE — Assessment & Plan Note (Signed)
No recent activity. 

## 2013-12-27 NOTE — Assessment & Plan Note (Signed)
Found to be a rotator cuff injury requiring repair, surgery by [facility]. May try topical treatments such as Salon  Pas prn

## 2014-02-04 ENCOUNTER — Telehealth: Payer: Self-pay | Admitting: Family

## 2014-02-04 DIAGNOSIS — M25511 Pain in right shoulder: Secondary | ICD-10-CM

## 2014-02-04 NOTE — Telephone Encounter (Signed)
Caller name: Keanu Relation to pt: self Call back number: [phone] Pharmacy:  Reason for call:   Patient requesting a new rx of hydrocodone and cyclobenzaprine.

## 2014-02-05 NOTE — Telephone Encounter (Signed)
Last OV was 12-22-13  Last RX for flexeril was 09-23-13 quantity 30 with 0 refills  Last RX for Hydrocodone was 05-30-12 quantity 15 with 0 refills  Please advise?

## 2014-02-06 NOTE — Telephone Encounter (Signed)
OK to refill both meds at same strength, same sig and and same number, come in if pain becomes wirse ir dies bit resikve,

## 2014-02-08 MED ORDER — HYDROCODONE-ACETAMINOPHEN 5-325 MG PO TABS: 1.0000 | ORAL_TABLET | Freq: Four times a day (QID) | ORAL | Status: AC | PRN

## 2014-02-08 MED ORDER — CYCLOBENZAPRINE HCL 5 MG PO TABS: ORAL_TABLET | ORAL | Status: DC

## 2014-02-08 NOTE — Telephone Encounter (Signed)
Please advise pt the Hydrocodone rx is ready to pick up. Flexeril went to pharmacy  And dr Rhae Lerner message below

## 2014-02-08 NOTE — Telephone Encounter (Signed)
Left message informing patient of this.  °

## 2014-04-02 ENCOUNTER — Telehealth: Payer: Self-pay | Admitting: *Deleted

## 2014-04-02 NOTE — Telephone Encounter (Signed)
Received medical record request via fax from [facility]. Forwarded to health port. JG//CMA

## 2014-05-25 ENCOUNTER — Telehealth: Payer: Self-pay | Admitting: *Deleted

## 2014-05-25 NOTE — Telephone Encounter (Signed)
Received signed medical record release via fax from [facility]. Forwarded to [place]. JG//CMA

## 2014-07-14 ENCOUNTER — Telehealth: Payer: Self-pay | Admitting: Family Medicine

## 2014-07-14 DIAGNOSIS — F418 Other specified anxiety disorders: Secondary | ICD-10-CM

## 2014-07-14 NOTE — Telephone Encounter (Signed)
Caller name:Kobayashi,William E Relation to SL:[id number]  Call back [phone] Pharmacy: [id number] - [place], [place] [phone] (Phone) [phone] (Fax)         Reason for call: spouse requesting a refill  pantoprazole (PROTONIX) 40 MG tablet citalopram (CELEXA) 20 MG tablet  citalopram (CELEXA) 20 MG tablet  ARIPiprazole (ABILIFY) 2 MG tablet  HYDROcodone-acetaminophen (NORCO/VICODIN) 5-325 MG per tablet 1 tablet

## 2014-07-15 MED ORDER — CITALOPRAM HYDROBROMIDE 20 MG PO TABS: 20.0000 mg | ORAL_TABLET | Freq: Every day | ORAL | Status: DC

## 2014-07-15 MED ORDER — PANTOPRAZOLE SODIUM 40 MG PO TBEC: 40.0000 mg | DELAYED_RELEASE_TABLET | Freq: Every day | ORAL | Status: DC

## 2014-07-15 MED ORDER — ARIPIPRAZOLE 2 MG PO TABS: 2.0000 mg | ORAL_TABLET | Freq: Every day | ORAL | Status: DC

## 2014-07-15 NOTE — Telephone Encounter (Signed)
Faxed hardcopy for abilify to [facility].  Did inform the patients daughter of PCP instructions on future refills/appt. asap.

## 2014-07-15 NOTE — Telephone Encounter (Signed)
Last office visit 12/22/13 Patient was advised to followup at last visit on 04/24/14 and has not done so and no upcoming appt. Scheduled. Advise on these refills please.

## 2014-07-15 NOTE — Telephone Encounter (Signed)
Sent in meds. To pharmacy and printed Abilify for PCP to sign. Called the patient left message to call back

## 2014-07-15 NOTE — Telephone Encounter (Signed)
She can have a 30 day supply for each med but she absolutely needs an appt for further refills

## 2014-08-06 ENCOUNTER — Telehealth: Payer: Self-pay | Admitting: Family Medicine

## 2014-08-06 NOTE — Telephone Encounter (Signed)
NORCO RX 02-08-2014 NOT PICKED UP SHREDDED

## 2014-08-11 ENCOUNTER — Emergency Department (HOSPITAL_COMMUNITY): Payer: 59

## 2014-08-11 ENCOUNTER — Encounter (HOSPITAL_COMMUNITY): Payer: Self-pay | Admitting: General Practice

## 2014-08-11 ENCOUNTER — Observation Stay (HOSPITAL_COMMUNITY)
Admission: EM | Admit: 2014-08-11 | Discharge: 2014-08-12 | Disposition: A | Discharge status: Home / Self Care | Payer: 59 | Attending: Internal Medicine | Admitting: Internal Medicine

## 2014-08-11 DIAGNOSIS — Z91148 Patient's other noncompliance with medication regimen for other reason: Secondary | ICD-10-CM

## 2014-08-11 DIAGNOSIS — Z800 Family history of malignant neoplasm of digestive organs: Secondary | ICD-10-CM | POA: Diagnosis not present

## 2014-08-11 DIAGNOSIS — I10 Essential (primary) hypertension: Secondary | ICD-10-CM | POA: Diagnosis not present

## 2014-08-11 DIAGNOSIS — G43909 Migraine, unspecified, not intractable, without status migrainosus: Secondary | ICD-10-CM | POA: Diagnosis not present

## 2014-08-11 DIAGNOSIS — R569 Unspecified convulsions: Secondary | ICD-10-CM | POA: Diagnosis present

## 2014-08-11 DIAGNOSIS — F32A Depression, unspecified: Secondary | ICD-10-CM | POA: Diagnosis present

## 2014-08-11 DIAGNOSIS — Z7901 Long term (current) use of anticoagulants: Secondary | ICD-10-CM | POA: Insufficient documentation

## 2014-08-11 DIAGNOSIS — F17210 Nicotine dependence, cigarettes, uncomplicated: Secondary | ICD-10-CM | POA: Insufficient documentation

## 2014-08-11 DIAGNOSIS — F329 Major depressive disorder, single episode, unspecified: Secondary | ICD-10-CM | POA: Diagnosis not present

## 2014-08-11 DIAGNOSIS — Z90710 Acquired absence of both cervix and uterus: Secondary | ICD-10-CM | POA: Insufficient documentation

## 2014-08-11 DIAGNOSIS — Z9114 Patient's other noncompliance with medication regimen: Secondary | ICD-10-CM | POA: Diagnosis not present

## 2014-08-11 DIAGNOSIS — G40909 Epilepsy, unspecified, not intractable, without status epilepticus: Principal | ICD-10-CM

## 2014-08-11 DIAGNOSIS — F419 Anxiety disorder, unspecified: Secondary | ICD-10-CM | POA: Diagnosis present

## 2014-08-11 DIAGNOSIS — Z8249 Family history of ischemic heart disease and other diseases of the circulatory system: Secondary | ICD-10-CM | POA: Diagnosis not present

## 2014-08-11 DIAGNOSIS — Z87898 Personal history of other specified conditions: Secondary | ICD-10-CM

## 2014-08-11 DIAGNOSIS — Z833 Family history of diabetes mellitus: Secondary | ICD-10-CM | POA: Diagnosis not present

## 2014-08-11 DIAGNOSIS — Z8669 Personal history of other diseases of the nervous system and sense organs: Secondary | ICD-10-CM

## 2014-08-11 LAB — COMPREHENSIVE METABOLIC PANEL
ALT: 14 U/L (ref 14–54)
AST: 20 U/L (ref 15–41)
Albumin: 3.7 g/dL (ref 3.5–5.0)
Alkaline Phosphatase: 56 U/L (ref 38–126)
Anion gap: 11 (ref 5–15)
BUN: 9 mg/dL (ref 6–20)
CO2: 21 mmol/L — ABNORMAL LOW (ref 22–32)
Calcium: 9.1 mg/dL (ref 8.9–10.3)
Chloride: 108 mmol/L (ref 101–111)
Creatinine, Ser: 0.51 mg/dL (ref 0.44–1.00)
GFR calc Af Amer: >60 mL/min (ref >60)
GFR calc non Af Amer: >60 mL/min (ref >60)
Glucose, Bld: 85 mg/dL (ref 65–99)
Potassium: 3.5 mmol/L (ref 3.5–5.1)
Sodium: 140 mmol/L (ref 135–145)
Total Bilirubin: 0.8 mg/dL (ref 0.3–1.2)
Total Protein: 6.6 g/dL (ref 6.5–8.1)

## 2014-08-11 LAB — CBC WITH DIFFERENTIAL/PLATELET
Basophils Absolute: 0 10*3/uL (ref 0.0–0.1)
Basophils Relative: 0 % (ref 0–1)
Eosinophils Absolute: 0.1 10*3/uL (ref 0.0–0.7)
Eosinophils Relative: 1 % (ref 0–5)
HCT: 38 % (ref 36.0–46.0)
Hemoglobin: 12.8 g/dL (ref 12.0–15.0)
Lymphocytes Relative: 21 % (ref 12–46)
Lymphs Abs: 2 10*3/uL (ref 0.7–4.0)
MCH: 27.6 pg (ref 26.0–34.0)
MCHC: 33.7 g/dL (ref 30.0–36.0)
MCV: 82.1 fL (ref 78.0–100.0)
Monocytes Absolute: 0.4 10*3/uL (ref 0.1–1.0)
Monocytes Relative: 4 % (ref 3–12)
Neutro Abs: 7.1 10*3/uL (ref 1.7–7.7)
Neutrophils Relative %: 74 % (ref 43–77)
Platelets: 251 10*3/uL (ref 150–400)
RBC: 4.63 MIL/uL (ref 3.87–5.11)
RDW: 15.1 % (ref 11.5–15.5)
WBC: 9.6 10*3/uL (ref 4.0–10.5)

## 2014-08-11 LAB — PHENYTOIN LEVEL, TOTAL: Phenytoin Lvl: <2.5 ug/mL — ABNORMAL LOW (ref 10.0–20.0)

## 2014-08-11 MED ORDER — SODIUM CHLORIDE 0.9 % IV SOLN
500.0000 mg | Freq: Once | INTRAVENOUS | Status: DC
  Filled 2014-08-11 (×2): qty 10

## 2014-08-11 MED ORDER — LORAZEPAM 2 MG/ML IJ SOLN
1.0000 mg | Freq: Once | INTRAMUSCULAR | Status: AC
  Administered 2014-08-11: 1 mg via INTRAVENOUS

## 2014-08-11 MED ORDER — LORAZEPAM 0.5 MG PO TABS
0.5000 mg | ORAL_TABLET | Freq: Three times a day (TID) | ORAL | Status: DC | PRN
Start: 2014-08-11 — End: 2014-08-12
  Administered 2014-08-11 (×2): 0.5 mg via ORAL
  Filled 2014-08-11 (×2): qty 1

## 2014-08-11 MED ORDER — ONDANSETRON HCL 4 MG PO TABS: 4.0000 mg | ORAL_TABLET | Freq: Four times a day (QID) | ORAL | Status: DC | PRN

## 2014-08-11 MED ORDER — ALBUTEROL SULFATE (2.5 MG/3ML) 0.083% IN NEBU: 2.5000 mg | INHALATION_SOLUTION | Freq: Four times a day (QID) | RESPIRATORY_TRACT | Status: DC | PRN

## 2014-08-11 MED ORDER — LORAZEPAM 2 MG/ML IJ SOLN
INTRAMUSCULAR | Status: AC
  Filled 2014-08-11: qty 1

## 2014-08-11 MED ORDER — HEPARIN SODIUM (PORCINE) 5000 UNIT/ML IJ SOLN
5000.0000 [IU] | Freq: Three times a day (TID) | INTRAMUSCULAR | Status: DC
  Administered 2014-08-11 – 2014-08-12 (×2): 5000 [IU] via SUBCUTANEOUS
  Filled 2014-08-11 (×3): qty 1

## 2014-08-11 MED ORDER — ARIPIPRAZOLE 2 MG PO TABS
2.0000 mg | ORAL_TABLET | Freq: Every day | ORAL | Status: DC
  Administered 2014-08-11 – 2014-08-12 (×2): 2 mg via ORAL
  Filled 2014-08-11 (×2): qty 1

## 2014-08-11 MED ORDER — PANTOPRAZOLE SODIUM 40 MG PO TBEC
40.0000 mg | DELAYED_RELEASE_TABLET | Freq: Every day | ORAL | Status: DC
Start: 2014-08-12 — End: 2014-08-12
  Administered 2014-08-12: 40 mg via ORAL
  Filled 2014-08-11: qty 1

## 2014-08-11 MED ORDER — ALUM & MAG HYDROXIDE-SIMETH 200-200-20 MG/5ML PO SUSP: 30.0000 mL | Freq: Four times a day (QID) | ORAL | Status: DC | PRN

## 2014-08-11 MED ORDER — CITALOPRAM HYDROBROMIDE 20 MG PO TABS
20.0000 mg | ORAL_TABLET | Freq: Every day | ORAL | Status: DC
  Administered 2014-08-11 – 2014-08-12 (×2): 20 mg via ORAL
  Filled 2014-08-11 (×2): qty 1

## 2014-08-11 MED ORDER — ACETAMINOPHEN 325 MG PO TABS
650.0000 mg | ORAL_TABLET | Freq: Four times a day (QID) | ORAL | Status: DC | PRN
  Administered 2014-08-11: 650 mg via ORAL
  Filled 2014-08-11: qty 2

## 2014-08-11 MED ORDER — GABAPENTIN 600 MG PO TABS
600.0000 mg | ORAL_TABLET | Freq: Three times a day (TID) | ORAL | Status: DC
  Administered 2014-08-11 – 2014-08-12 (×3): 600 mg via ORAL
  Filled 2014-08-11 (×5): qty 1

## 2014-08-11 MED ORDER — PHENYTOIN SODIUM EXTENDED 100 MG PO CAPS
200.0000 mg | ORAL_CAPSULE | Freq: Every day | ORAL | Status: DC
  Filled 2014-08-11: qty 2

## 2014-08-11 MED ORDER — ONDANSETRON HCL 4 MG/2ML IJ SOLN: 4.0000 mg | Freq: Four times a day (QID) | INTRAMUSCULAR | Status: DC | PRN

## 2014-08-11 MED ORDER — SODIUM CHLORIDE 0.9 % IV SOLN
INTRAVENOUS | Status: DC
  Administered 2014-08-11: 14:00:00 via INTRAVENOUS

## 2014-08-11 MED ORDER — ACETAMINOPHEN 650 MG RE SUPP: 650.0000 mg | Freq: Four times a day (QID) | RECTAL | Status: DC | PRN

## 2014-08-11 MED ORDER — METOPROLOL TARTRATE 25 MG PO TABS
25.0000 mg | ORAL_TABLET | Freq: Two times a day (BID) | ORAL | Status: DC
  Administered 2014-08-11 – 2014-08-12 (×2): 25 mg via ORAL
  Filled 2014-08-11 (×3): qty 1

## 2014-08-11 MED ORDER — SODIUM CHLORIDE 0.9 % IV SOLN
1000.0000 mg | Freq: Once | INTRAVENOUS | Status: AC
  Administered 2014-08-11: 1000 mg via INTRAVENOUS
  Filled 2014-08-11: qty 20

## 2014-08-11 MED ORDER — ALBUTEROL SULFATE HFA 108 (90 BASE) MCG/ACT IN AERS: 2.0000 | INHALATION_SPRAY | Freq: Four times a day (QID) | RESPIRATORY_TRACT | Status: DC | PRN

## 2014-08-11 NOTE — H&P (Signed)
[facility] History and Physical                                                                                    Ruth Bell, is a 54 y.o. female  MRN: [id number]   DOB - September 04, 1960  Admit Date - 08/11/2014  Outpatient Primary MD for the patient is [name], MD  Referring MD: Roderic Palau / ER  With History of -  Past Medical History  Diagnosis Date  . Seizures     history of pseudoseizure disorder  . Migraine, unspecified, without mention of intractable migraine without mention of status migrainosus   . Anemia   . Menometrorrhagia 2006  . Uterine leiomyoma 2006  . Depression   . Hypertension   . Tubular adenoma of colon 05/2011  . Constipation 12/27/2013      Past Surgical History  Procedure Laterality Date  . Appendectomy    . Bilateral salpingoophorectomy  04/06/04  . Abdominal hysterectomy  04/06/04  . Rotator cuff repair      in for   Chief Complaint  Patient presents with  . Seizures     HPI This is a 54 year old female patient with history of anxiety, depression, seizure disorder with associated documented pseudoseizures, and hypertension. Patient presented to the ER today after having an unwitnessed seizure at home. EMS was called and patient had 2 additional seizures while in the field. After presenting to the ER patient had a third seizure. Patient reported to the EDP that it had been 5 years since her last seizure episode. She reported she required hospitalization after that seizure and told the ER physician she had had a cardiac arrest during that hospitalization.  Upon initial evaluation in the ER patient was afebrile, normotensive and not hypoxemic. Laboratory data was unremarkable except for subtherapeutic Dilantin level at less than 2.5. Patient's third seizure occurred during the administration of a Dilantin bolus. CT of the head was unremarkable.  I attempted to obtain additional history from the patient. Patient told me that she was not taking  her Dilantin because it made her sleepy and affected her ability to perform at her job. When I began to question her about what type of job she worked she developed what appeared to be tonic-clonic seizure-like activity involving the head and upper extremities with an apparent aura preceding the seizure- activity. Duration of seizure activity less than 1 minute with an apparent postictal phase. The patient's legs remain crossed during episode and patient had what appeared to be purposeful activity involving the upper extremities noting she grabbed her right arm with her left hand as if to stabilize her IV site during her seizure activity. I was unable to obtain further history from the patient and left the room. The nurse documented that immediately after I left the room patient awakened, sat up and remarked "where his everybody at". In review of patient's problem list it is documented she has a history of pseudoseizures. In review of her medications she is also on Abilify although her past medical history only lists anxiety and depression. She was last evaluated in [facility] by Dr. Randel Pigg on 12/27/13. Patient at that time was  reporting weight loss that appear to be related to stress.   Review of Systems   In addition to the HPI above,  Unable to obtain at time of admission due to recurrent seizure-like activity with postictal phase  *A full 10 point Review of Systems was done, except as stated above, all other Review of Systems were negative.  Social History History  Substance Use Topics  . Smoking status: Current Every Day Smoker -- 0.33 packs/day for 40 years    Types: Cigarettes    Start date: 11/17/2013  . Smokeless tobacco: Never Used     Comment: 3-4 cigarettes daily  . Alcohol Use: No     **Obtained from the chart Resides at: Private residence  Lives with: Presumed alone  Ambulatory status: Ambulatory without assistive devices prior to admission   Family History Family History    Problem Relation Age of Onset  . Heart disease Mother   . Heart attack Mother   . Cancer Father     colon  . Hypertension Father   . Cancer Sister     brain  . Cancer Brother   . Hypertension Sister   . Cancer Sister     unsure of type  . Diabetes Sister     type 2  . Heart attack Sister   . Hypertension Brother      Prior to Admission medications   Medication Sig Start Date End Date Taking? Authorizing Provider  albuterol (PROVENTIL HFA;VENTOLIN HFA) 108 (90 BASE) MCG/ACT inhaler Inhale 2 puffs into the lungs every 6 (six) hours as needed for wheezing. 09/04/11  Yes Debbrah Alar, NP  ARIPiprazole (ABILIFY) 2 MG tablet Take 1 tablet (2 mg total) by mouth daily. 07/15/14  Yes Mosie Lukes, MD  citalopram (CELEXA) 20 MG tablet Take 1 tablet (20 mg total) by mouth daily. 07/15/14  Yes Mosie Lukes, MD  gabapentin (NEURONTIN) 600 MG tablet Take 600 mg by mouth 3 (three) times daily.   Yes Historical Provider, MD  ibuprofen (ADVIL,MOTRIN) 200 MG tablet Take 400 mg by mouth every 6 (six) hours as needed for mild pain or moderate pain.   Yes Historical Provider, MD  LORazepam (ATIVAN) 0.5 MG tablet Take 1 tablet (0.5 mg total) by mouth every 8 (eight) hours as needed for anxiety. 12/22/13  Yes Mosie Lukes, MD  meloxicam (MOBIC) 7.5 MG tablet Take 7.5 mg by mouth daily.   Yes Historical Provider, MD  metoprolol tartrate (LOPRESSOR) 25 MG tablet Take 25 mg by mouth 2 (two) times daily.   Yes Historical Provider, MD  pantoprazole (PROTONIX) 40 MG tablet Take 1 tablet (40 mg total) by mouth daily. 07/15/14  Yes Mosie Lukes, MD  phenytoin (DILANTIN) 100 MG ER capsule Take 200 mg by mouth daily.   Yes Debbrah Alar, NP    No Known Allergies  Physical Exam  Vitals  Blood pressure 151/80, pulse 68, temperature 97.9 F (36.6 C), temperature source Oral, resp. rate 17, SpO2 100 %.   General:  In no acute distress, appears chronically ill and somewhat underweight  Psych:   Normal affect, Denies Suicidal or Homicidal ideations, Awake Alert, Oriented X 3 prior to onset of seizure-like activity during H/P-additional history unobtainable due to postictal phase at time of admission  Neuro:   No apparent focal neurological deficits appreciated during time. Patient was alert, CN II through XII intact based on visual inspection prior to onset of seizure-like activity, Strength 5/5 all 4 extremities a stone partial  exam and observation prior to onset of seizure-like activity  ENT:  Ears and Eyes appear Normal, Conjunctivae clear, PER. Moist oral mucosa without erythema or exudates.  Neck:  Supple, No lymphadenopathy appreciated  Respiratory:  Symmetrical chest wall movement, Good air movement bilaterally, CTAB. Room Air  Cardiac:  RRR, No Murmurs, no LE edema noted, no JVD, No carotid bruits, peripheral pulses palpable at 2+  Abdomen:  Positive bowel sounds, Soft, Non tender, Non distended,  No masses appreciated, no obvious hepatosplenomegaly  Skin:  No Cyanosis, Normal Skin Turgor, No Skin Rash or Bruise.  Extremities: Symmetrical without obvious trauma or injury,  no effusions.  Data Review  CBC  Recent Labs Lab 08/11/14 0932  WBC 9.6  HGB 12.8  HCT 38.0  PLT 251  MCV 82.1  MCH 27.6  MCHC 33.7  RDW 15.1  LYMPHSABS 2.0  MONOABS 0.4  EOSABS 0.1  BASOSABS 0.0    Chemistries   Recent Labs Lab 08/11/14 0932  NA 140  K 3.5  CL 108  CO2 21*  GLUCOSE 85  BUN 9  CREATININE 0.51  CALCIUM 9.1  AST 20  ALT 14  ALKPHOS 56  BILITOT 0.8    CrCl cannot be calculated (Unknown ideal weight.).  No results for input(s): TSH, T4TOTAL, T3FREE, THYROIDAB in the last 72 hours.  Invalid input(s): FREET3  Coagulation profile No results for input(s): INR, PROTIME in the last 168 hours.  No results for input(s): DDIMER in the last 72 hours.  Cardiac Enzymes No results for input(s): CKMB, TROPONINI, MYOGLOBIN in the last 168 hours.  Invalid  input(s): CK  Invalid input(s): POCBNP  Urinalysis    Component Value Date/Time   COLORURINE YELLOW 12/22/2013 1711   APPEARANCEUR CLEAR 12/22/2013 1711   LABSPEC 1.025 12/22/2013 1711   PHURINE 6.0 12/22/2013 1711   GLUCOSEU NEGATIVE 12/22/2013 1711   GLUCOSEU NEGATIVE 05/26/2012 1719   HGBUR MODERATE* 12/22/2013 1711   BILIRUBINUR NEGATIVE 12/22/2013 1711   BILIRUBINUR small 06/19/2011 [place] 12/22/2013 1711   PROTEINUR NEGATIVE 05/26/2012 1719   PROTEINUR trace 06/19/2011 1453   UROBILINOGEN 0.2 12/22/2013 1711   UROBILINOGEN 0.2 06/19/2011 1453   NITRITE NEGATIVE 12/22/2013 1711   NITRITE negative 06/19/2011 1453   LEUKOCYTESUR NEGATIVE 12/22/2013 1711    Imaging results:   Ct Head Wo Contrast  08/11/2014   CLINICAL DATA:  Seizure disorder with fall during seizure hitting back of head. Loss of consciousness and confusion.  EXAM: CT HEAD WITHOUT CONTRAST  TECHNIQUE: Contiguous axial images were obtained from the base of the skull through the vertex without intravenous contrast.  COMPARISON:  07/02/2012  FINDINGS: The ventricles, cisterns and other CSF spaces are normal. There is no mass, mass effect, shift of midline structures or acute hemorrhage. No evidence of acute infarction. Remaining bones and soft tissues are within normal.  IMPRESSION: Normal head CT.   Electronically Signed   By: Marin Olp M.D.   On: 08/11/2014 12:23     EKG: (Independently reviewed) normal sinus rhythm without any acute ST-T wave changes that would be concerning for ischemia   Assessment & Plan  Principal Problem:   Seizure disorder/  History of pseudoseizure -Admit to neurology floor-camera bed preferred-observational status -Any seizure activity at this juncture attributable to noncompliance with medications and therefore subtherapeutic Dilantin level; no indication at this juncture to pursue neurological consultation -Has been loaded with IV Dilantin and ER -Continue  preadmission Dilantin -Nothing by mouth except for meds with  sips of water until seizure free -Camera monitoring for recurrent seizure activity and to help differentiate true seizure activity from possible pseudoseizure activity -Patient reported noncompliance with medication because of side effects-may need to pursue alternate medications with less side effects if patient able to obtain from a monetary standpoint -Was also on Neurontin but unclear she was taking this for seizures or for issues related to chronic cervical neck pain  Active Problems:   Anxiety and depression -Continue Abilify -Apparently was on Ativan every 8 hours when necessary for anxiety at home; if patient was taking this regularly and ran out of this medication for whatever reason this may have precipitated true seizure activity as well especially in setting of subtherapeutic Dilantin level -Resume preadmission Ativan    HTN (hypertension) -Continue Lopressor    Noncompliance with medications -Please see above    DVT Prophylaxis: Subcutaneous heparin  Family Communication: No family at bedside    Code Status:  Full code based on previous admission  Condition:  Stable  Discharge disposition: Anticipate discharge back to home environment  Time spent in minutes : 60      ELLIS,ALLISON L. ANP on 08/11/2014 at 1:38 PM  Between 7am to 7pm - Pager - [phone]  After 7pm go to [email or web address] - password [email or web address]  And look for the night coverage person covering me after hours  [facility]

## 2014-08-11 NOTE — ED Notes (Signed)
Pt having tonic clonic movements with head jerking, pt foaming at the mouth. Pt suctioned and placed on her side. Pt given 1 mg of IV ativan. Pt awake but verbalizing being scared and confused. Pt is A/O.

## 2014-08-11 NOTE — ED Notes (Signed)
Admitting team at bedside. Pt talking to provider and starts to stare off as she is asked a question. Pt then starts tensing body, eyes open, states she is having a seizure. Pt says "I'll be with you in a minute." Pt closes her eyes, has legs crossed and shakes head. Pt then rests quietly. Episode last approx 1 minutes. Pt wakes up as soon as provider leaves. Pt snores and sits up and says "where everybody at?". RN at bedside. Pt stable

## 2014-08-11 NOTE — ED Notes (Signed)
Pt left for CT.

## 2014-08-11 NOTE — ED Notes (Signed)
Pt brought in via [facility] after experiencing a seizure at work. 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 Pt braced herself for the fall but doesn't remember the actual fall. Pt is currently A/O. Pt has no obvious signs of injury, no head trauma noted. Pt remembers feeling very confused when she woke back up. During transit EMS noted an additional seizure that lasted approximately 1 minute, EMS observed shaking and jerking of all extremities. EMS gave pt 2.5mg  of midazolam. Per pt son pt is at baseline jerky and stutters. Pt reports not taking seizure medication due to her job not allowing it on the job.

## 2014-08-11 NOTE — ED Notes (Signed)
Patient transported to CT 

## 2014-08-11 NOTE — Progress Notes (Signed)
Ruth Bell [id number] Admission Data: 08/11/2014 3:52 PM Attending Provider: Albertine Patricia, MD  MQK:[id number], Ruth Levine, MD Consults/ Treatment Team:    Ruth Bell is a 54 y.o. female patient admitted from ED awake, alert  & orientated  X 3,  Full Code, VSS - Blood pressure 134/83, pulse 84, temperature 99.2 F (37.3 C), temperature source Oral, resp. rate 16, SpO2 99 %.,r, Bell c/o shortness of breath, Bell c/o chest pain, Bell distress noted.    IV site WDL:  Right arm, running NS.   Allergies:  Bell Known Allergies   Past Medical History  Diagnosis Date  . Seizures     history of pseudoseizure disorder  . Migraine, unspecified, without mention of intractable migraine without mention of status migrainosus   . Anemia   . Menometrorrhagia 2006  . Uterine leiomyoma 2006  . Depression   . Hypertension   . Tubular adenoma of colon 05/2011  . Constipation 12/27/2013     Pt orientation to unit, room and routine. Information packet given to patient/family and safety video watched.  Admission INP armband ID verified with patient/family, and in place. SR up x 2, fall risk assessment complete with Patient and family verbalizing understanding of risks associated with falls. Pt verbalizes an understanding of how to use the call bell and to call for help before getting out of bed.  Skin, clean-dry- intact without evidence of bruising, or skin tears.   Bell evidence of skin break down noted on exam.    Will cont to monitor and assist as needed.  Ruth Points, RN 08/11/2014 3:52 PM

## 2014-08-11 NOTE — ED Provider Notes (Signed)
CSN: [id number]     Arrival date & time 08/11/14  0904 History   First MD Initiated Contact with Patient 08/11/14 [phone]     Chief Complaint  Patient presents with  . Seizures     (Consider location/radiation/quality/duration/timing/severity/associated sxs/prior Treatment) Patient is a 54 y.o. female presenting with seizures. The history is provided by the patient (the pt had 2 seizures today and has not been taking her dilantin,   her last sz was  5 years ago).  Seizures Seizure activity on arrival: yes   Seizure type:  Grand mal Preceding symptoms: no sensation of an aura present   Initial focality:  None Episode characteristics: confusion   Postictal symptoms: confusion   Return to baseline: yes   Severity:  Moderate   Past Medical History  Diagnosis Date  . Seizures     history of pseudoseizure disorder  . Migraine, unspecified, without mention of intractable migraine without mention of status migrainosus   . Anemia   . Menometrorrhagia 2006  . Uterine leiomyoma 2006  . Depression   . Hypertension   . Tubular adenoma of colon 05/2011  . Constipation 12/27/2013   Past Surgical History  Procedure Laterality Date  . Appendectomy    . Bilateral salpingoophorectomy  04/06/04  . Abdominal hysterectomy  04/06/04  . Rotator cuff repair     Family History  Problem Relation Age of Onset  . Heart disease Mother   . Heart attack Mother   . Cancer Father     colon  . Hypertension Father   . Cancer Sister     brain  . Cancer Brother   . Hypertension Sister   . Cancer Sister     unsure of type  . Diabetes Sister     type 2  . Heart attack Sister   . Hypertension Brother    History  Substance Use Topics  . Smoking status: Current Every Day Smoker -- 0.33 packs/day for 40 years    Types: Cigarettes    Start date: 11/17/2013  . Smokeless tobacco: Never Used     Comment: 3-4 cigarettes daily  . Alcohol Use: No   OB History    No data available     Review of  Systems  Constitutional: Negative for appetite change and fatigue.  HENT: Negative for congestion, ear discharge and sinus pressure.   Eyes: Negative for discharge.  Respiratory: Negative for cough.   Cardiovascular: Negative for chest pain.  Gastrointestinal: Negative for abdominal pain and diarrhea.  Genitourinary: Negative for frequency and hematuria.  Musculoskeletal: Negative for back pain.  Skin: Negative for rash.  Neurological: Positive for seizures. Negative for headaches.  Psychiatric/Behavioral: Negative for hallucinations.      Allergies  Review of patient's allergies indicates no known allergies.  Home Medications   Prior to Admission medications   Medication Sig Start Date End Date Taking? Authorizing Provider  albuterol (PROVENTIL HFA;VENTOLIN HFA) 108 (90 BASE) MCG/ACT inhaler Inhale 2 puffs into the lungs every 6 (six) hours as needed for wheezing. 09/04/11  Yes Debbrah Alar, NP  ARIPiprazole (ABILIFY) 2 MG tablet Take 1 tablet (2 mg total) by mouth daily. 07/15/14  Yes Mosie Lukes, MD  citalopram (CELEXA) 20 MG tablet Take 1 tablet (20 mg total) by mouth daily. 07/15/14  Yes Mosie Lukes, MD  gabapentin (NEURONTIN) 600 MG tablet Take 600 mg by mouth 3 (three) times daily.   Yes Historical Provider, MD  ibuprofen (ADVIL,MOTRIN) 200 MG tablet Take 400 mg  by mouth every 6 (six) hours as needed for mild pain or moderate pain.   Yes Historical Provider, MD  LORazepam (ATIVAN) 0.5 MG tablet Take 1 tablet (0.5 mg total) by mouth every 8 (eight) hours as needed for anxiety. 12/22/13  Yes Mosie Lukes, MD  meloxicam (MOBIC) 7.5 MG tablet Take 7.5 mg by mouth daily.   Yes Historical Provider, MD  metoprolol tartrate (LOPRESSOR) 25 MG tablet Take 25 mg by mouth 2 (two) times daily.   Yes Historical Provider, MD  pantoprazole (PROTONIX) 40 MG tablet Take 1 tablet (40 mg total) by mouth daily. 07/15/14  Yes Mosie Lukes, MD  phenytoin (DILANTIN) 100 MG ER capsule Take  200 mg by mouth daily.   Yes Debbrah Alar, NP   BP 151/80 mmHg  Pulse 68  Temp(Src) 97.9 F (36.6 C) (Oral)  Resp 17  SpO2 100% Physical Exam  Constitutional: She is oriented to person, place, and time. She appears well-developed.  HENT:  Head: Normocephalic.  Eyes: Conjunctivae and EOM are normal. No scleral icterus.  Neck: Neck supple. No thyromegaly present.  Cardiovascular: Normal rate and regular rhythm.  Exam reveals no gallop and no friction rub.   No murmur heard. Pulmonary/Chest: No stridor. She has no wheezes. She has no rales. She exhibits no tenderness.  Abdominal: She exhibits no distension. There is no tenderness. There is no rebound.  Musculoskeletal: Normal range of motion. She exhibits no edema.  Lymphadenopathy:    She has no cervical adenopathy.  Neurological: She is oriented to person, place, and time. She exhibits normal muscle tone. Coordination normal.  Skin: No rash noted. No erythema.  Psychiatric: She has a normal mood and affect. Her behavior is normal.    ED Course  Procedures (including critical care time) Labs Review Labs Reviewed  COMPREHENSIVE METABOLIC PANEL - Abnormal; Notable for the following:    CO2 21 (*)    All other components within normal limits  PHENYTOIN LEVEL, TOTAL - Abnormal; Notable for the following:    Phenytoin Lvl <2.5 (*)    All other components within normal limits  CBC WITH DIFFERENTIAL/PLATELET    Imaging Review Ct Head Wo Contrast  08/11/2014   CLINICAL DATA:  Seizure disorder with fall during seizure hitting back of head. Loss of consciousness and confusion.  EXAM: CT HEAD WITHOUT CONTRAST  TECHNIQUE: Contiguous axial images were obtained from the base of the skull through the vertex without intravenous contrast.  COMPARISON:  07/02/2012  FINDINGS: The ventricles, cisterns and other CSF spaces are normal. There is no mass, mass effect, shift of midline structures or acute hemorrhage. No evidence of acute  infarction. Remaining bones and soft tissues are within normal.  IMPRESSION: Normal head CT.   Electronically Signed   By: Marin Olp M.D.   On: 08/11/2014 12:23     EKG Interpretation   Date/Time:  Wednesday Aug 11 2014 09:14:43 EDT Ventricular Rate:  80 PR Interval:  152 QRS Duration: 81 QT Interval:  404 QTC Calculation: 466 R Axis:   44 Text Interpretation:  Sinus rhythm Probable left atrial enlargement  Confirmed by Maritsa Hunsucker  MD, Giulietta Prokop [phone]) on 08/11/2014 11:31:20 AM      MDM   Final diagnoses:  Seizure    Admit for sz    Milton Ferguson, MD 08/11/14 1404

## 2014-08-11 NOTE — ED Notes (Signed)
Ruth Bell [phone] family member .call if you need family

## 2014-08-12 ENCOUNTER — Observation Stay (HOSPITAL_COMMUNITY): Payer: 59

## 2014-08-12 ENCOUNTER — Other Ambulatory Visit: Payer: Self-pay | Admitting: Family Medicine

## 2014-08-12 DIAGNOSIS — F418 Other specified anxiety disorders: Secondary | ICD-10-CM

## 2014-08-12 DIAGNOSIS — G4089 Other seizures: Secondary | ICD-10-CM | POA: Diagnosis not present

## 2014-08-12 DIAGNOSIS — G40909 Epilepsy, unspecified, not intractable, without status epilepticus: Secondary | ICD-10-CM | POA: Diagnosis not present

## 2014-08-12 DIAGNOSIS — Z9114 Patient's other noncompliance with medication regimen: Secondary | ICD-10-CM

## 2014-08-12 MED ORDER — AMMONIA AROMATIC IN INHA
0.3000 mL | Freq: Once | RESPIRATORY_TRACT | Status: DC | PRN
  Filled 2014-08-12: qty 10

## 2014-08-12 MED ORDER — SODIUM CHLORIDE 0.9 % IV SOLN
500.0000 mg | Freq: Two times a day (BID) | INTRAVENOUS | Status: DC
  Administered 2014-08-12: 500 mg via INTRAVENOUS
  Filled 2014-08-12 (×3): qty 5

## 2014-08-12 MED ORDER — LEVETIRACETAM 500 MG PO TABS: 500.0000 mg | ORAL_TABLET | Freq: Two times a day (BID) | ORAL | Status: DC

## 2014-08-12 NOTE — Consult Note (Signed)
Consult Reason for Consult:seizures Referring Physician: Dr Eliseo Squires  CC: break through seizures  HPI: Ruth Bell is an 54 y.o. female with history of seizures and pseudoseizures presenting after having unwitnessed seizure at home. Upon EMS arrival she had 2 additional seizures followed by one in the ED. She reports it has been 5 years since her last seizure. (Of note she has an EEG from 2012 showing pseudoseizures). Patient on dilantin at home, current level <2.5, reports not taking it due to fatigue.   While in the hospital, she had an episode that per description appears to be a pseudoseizure. Due to adverse effects, her dilantin was discontinued and she was started on keppra 500mg  BID. EEG pending. CT head imaging reviewed and unremarkable.   Past Medical History  Diagnosis Date  . Seizures     history of pseudoseizure disorder  . Migraine, unspecified, without mention of intractable migraine without mention of status migrainosus   . Anemia   . Menometrorrhagia 2006  . Uterine leiomyoma 2006  . Depression   . Hypertension   . Tubular adenoma of colon 05/2011  . Constipation 12/27/2013    Past Surgical History  Procedure Laterality Date  . Appendectomy    . Bilateral salpingoophorectomy  04/06/04  . Abdominal hysterectomy  04/06/04  . Rotator cuff repair      Family History  Problem Relation Age of Onset  . Heart disease Mother   . Heart attack Mother   . Cancer Father     colon  . Hypertension Father   . Cancer Sister     brain  . Cancer Brother   . Hypertension Sister   . Cancer Sister     unsure of type  . Diabetes Sister     type 2  . Heart attack Sister   . Hypertension Brother     Social History:  reports that she has been smoking Cigarettes.  She started smoking about 8 months ago. She has a 13.2 pack-year smoking history. She has never used smokeless tobacco. She reports that she does not drink alcohol or use illicit drugs.  No Known  Allergies  Medications:  Scheduled: . ARIPiprazole  2 mg Oral Daily  . citalopram  20 mg Oral Daily  . gabapentin  600 mg Oral TID  . heparin  5,000 Units Subcutaneous 3 times per day  . levETIRAcetam  500 mg Intravenous Q12H  . metoprolol tartrate  25 mg Oral BID  . pantoprazole  40 mg Oral Daily    ROS: Out of a complete 14 system review, the patient complains of only the following symptoms, and all other reviewed systems are negative. +anxiety  Physical Examination: Filed Vitals:   08/12/14 0504  BP: 148/87  Pulse: 72  Temp: 97.7 F (36.5 C)  Resp: 20   Physical Exam  Constitutional: He appears well-developed and well-nourished.  Psych: Affect appropriate to situation Eyes: No scleral injection HENT: No OP obstrucion Head: Normocephalic.  Cardiovascular: Normal rate and regular rhythm.  Respiratory: Effort normal and breath sounds normal.  GI: Soft. Bowel sounds are normal. No distension. There is no tenderness.  Skin: WDI  Neurologic Examination Mental Status: Alert, oriented, thought content appropriate.  Speech fluent without evidence of aphasia.  Able to follow 3 step commands without difficulty. Cranial Nerves: II: optic discs not visualized, visual fields grossly normal, pupils equal, round, reactive to light  III,IV, VI: ptosis not present, extra-ocular motions intact bilaterally V,VII: smile symmetric, facial light touch sensation normal  bilaterally VIII: hearing normal bilaterally IX,X: gag reflex present XI: trapezius strength/neck flexion strength normal bilaterally XII: tongue strength normal  Motor: Slightly decreased ROM in RUE due to chronic shoulder injury, otherwise 5/5 strength throughout  Sensory: Pinprick and light touch intact throughout, bilaterally Deep Tendon Reflexes: 2+ and symmetric throughout Plantars: Right: downgoing   Left: downgoing Cerebellar: normal finger-to-nose, and normal heel-to-shin test Gait:  deferred  Laboratory Studies:   Basic Metabolic Panel:  Recent Labs Lab 08/11/14 0932  NA 140  K 3.5  CL 108  CO2 21*  GLUCOSE 85  BUN 9  CREATININE 0.51  CALCIUM 9.1    Liver Function Tests:  Recent Labs Lab 08/11/14 0932  AST 20  ALT 14  ALKPHOS 56  BILITOT 0.8  PROT 6.6  ALBUMIN 3.7   No results for input(s): LIPASE, AMYLASE in the last 168 hours. No results for input(s): AMMONIA in the last 168 hours.  CBC:  Recent Labs Lab 08/11/14 0932  WBC 9.6  NEUTROABS 7.1  HGB 12.8  HCT 38.0  MCV 82.1  PLT 251    Cardiac Enzymes: No results for input(s): CKTOTAL, CKMB, CKMBINDEX, TROPONINI in the last 168 hours.  BNP: Invalid input(s): POCBNP  CBG: No results for input(s): GLUCAP in the last 168 hours.  Microbiology: Results for orders placed or performed in visit on 12/22/13  Urine Culture     Status: None   Collection Time: 12/22/13  5:11 PM  Result Value Ref Range Status   Colony Count NO GROWTH  Final   Organism ID, Bacteria NO GROWTH  Final    Coagulation Studies: No results for input(s): LABPROT, INR in the last 72 hours.  Urinalysis: No results for input(s): COLORURINE, LABSPEC, PHURINE, GLUCOSEU, HGBUR, BILIRUBINUR, KETONESUR, PROTEINUR, UROBILINOGEN, NITRITE, LEUKOCYTESUR in the last 168 hours.  Invalid input(s): APPERANCEUR  Lipid Panel:  No results found for: CHOL, TRIG, HDL, CHOLHDL, VLDL, LDLCALC  HgbA1C: No results found for: HGBA1C  Urine Drug Screen:     Component Value Date/Time   LABOPIA NONE DETECTED 09/25/2013 1602   COCAINSCRNUR NONE DETECTED 09/25/2013 1602   LABBENZ NONE DETECTED 09/25/2013 1602   AMPHETMU NONE DETECTED 09/25/2013 1602   THCU POSITIVE* 09/25/2013 1602   LABBARB NONE DETECTED 09/25/2013 1602    Alcohol Level: No results for input(s): ETH in the last 168 hours.   Imaging: Ct Head Wo Contrast  08/11/2014   CLINICAL DATA:  Seizure disorder with fall during seizure hitting back of head. Loss of  consciousness and confusion.  EXAM: CT HEAD WITHOUT CONTRAST  TECHNIQUE: Contiguous axial images were obtained from the base of the skull through the vertex without intravenous contrast.  COMPARISON:  07/02/2012  FINDINGS: The ventricles, cisterns and other CSF spaces are normal. There is no mass, mass effect, shift of midline structures or acute hemorrhage. No evidence of acute infarction. Remaining bones and soft tissues are within normal.  IMPRESSION: Normal head CT.   Electronically Signed   By: Marin Olp M.D.   On: 08/11/2014 12:23     Assessment/Plan:  53y/o woman with hx of seizures/pseudoseizures, migraines, anxiety presenting with recurrent episodes concerning for pseudoseizures vs true seizures. Dilantin level <2.5 and patient reports not taking due to adverse effects. Based on description her events appear consistent with pseudoseizures though with sub-therapeutic dilantin cannot rule out true ictal event.  -d/c dilantin -start keppra 500mg  BID -EEG -If EEG unremarkable can discharge home with outpatient neurology follow up -Patient is unable to drive, operate  heavy machinery, perform activities at heights or participate in water activities until release by outpatient physician.     Jim Like, DO [facility] [phone]  If 7pm- 7am, please page neurology on call as listed in [place]. 08/12/2014, 8:27 AM

## 2014-08-12 NOTE — Procedures (Signed)
History: 54 yo F with pseudoseizures  Sedation: none  Technique: This is a 19 channel routine scalp EEG performed at the bedside with bipolar and monopolar montages arranged in accordance to the international 10/20 system of electrode placement. One channel was dedicated to EKG recording.    Background: The background consists of intermixed alpha and beta activities. There is a well defined posterior dominant rhythm of 9 Hz that attenuates with eye opening. Sleep is recorded with normal appearing structures.   At the onset of recording, the patient is having irregular, migratory jerking movements that significantly involve the head. There is prominent posterior movement artifact due to head shaking but no epileptiform changes. The tech places an alcohol swab on the patient which aborts the episode. Immediately upon cessation of movements, the patient's EEG is normal.   During, photic, the patient had another episode with back arching, generalized shaking with no changes in EEG other than movement artifact.   Photic stimulation: Physiologic driving is difficult to assess due to the above described movements.   EEG Abnormalities: None  Clinical Interpretation: This normal EEG is recorded in the waking state. There was no seizure or seizure predisposition recorded on this study.   There were two episodes consistent with non-epileptic, likely psychogenic events as described above.   Roland Rack, MD [facility] [phone]  If 7pm- 7am, please page neurology on call as listed in [place].

## 2014-08-12 NOTE — Progress Notes (Signed)
Patient discharge teaching given, including activity, diet, follow-up appoints, and medications. Patient verbalized understanding of all discharge instructions. IV access was d/c'd. Vitals are stable. Skin is intact except as charted in most recent assessments. Pt to be escorted out by NT, to be driven home by family. 

## 2014-08-12 NOTE — Progress Notes (Signed)
EEG Completed; Results Pending  

## 2014-08-12 NOTE — Discharge Summary (Signed)
Physician Discharge Summary  Ruth Bell IRJ:[id number] DOB: 1960/08/05 DOA: 08/11/2014  PCP: [name], MD  Admit date: 08/11/2014 Discharge date: 08/12/2014  Time spent: 35 minutes  Recommendations for Outpatient Follow-up:  Patient is unable to drive, operate heavy machinery, perform activities at heights or participate in water activities until release by outpatient physician  Discharge Diagnoses:  Principal Problem:   Seizure disorder Active Problems:   Anxiety and depression   History of pseudoseizure   HTN (hypertension)   Noncompliance with medications   Seizure   Discharge Condition: improved  Diet recommendation: regular  Filed Weights   08/11/14 1549 08/12/14 0501  Weight: 45 kg (99 lb 3.3 oz) 45.8 kg (100 lb 15.5 oz)    History of present illness:  This is a 54 year old female patient with history of anxiety, depression, seizure disorder with associated documented pseudoseizures, and hypertension. Patient presented to the ER today after having an unwitnessed seizure at home. EMS was called and patient had 2 additional seizures while in the field. After presenting to the ER patient had a third seizure. Patient reported to the EDP that it had been 5 years since her last seizure episode. She reported she required hospitalization after that seizure and told the ER physician she had had a cardiac arrest during that hospitalization.  Upon initial evaluation in the ER patient was afebrile, normotensive and not hypoxemic. Laboratory data was unremarkable except for subtherapeutic Dilantin level at less than 2.5. Patient's third seizure occurred during the administration of a Dilantin bolus. CT of the head was unremarkable.  I attempted to obtain additional history from the patient. Patient told me that she was not taking her Dilantin because it made her sleepy and affected her ability to perform at her job. When I began to question her about what type of job she worked she  developed what appeared to be tonic-clonic seizure-like activity involving the head and upper extremities with an apparent aura preceding the seizure- activity. Duration of seizure activity less than 1 minute with an apparent postictal phase. The patient's legs remain crossed during episode and patient had what appeared to be purposeful activity involving the upper extremities noting she grabbed her right arm with her left hand as if to stabilize her IV site during her seizure activity. I was unable to obtain further history from the patient and left the room. The nurse documented that immediately after I left the room patient awakened, sat up and remarked "where his everybody at". In review of patient's problem list it is documented she has a history of pseudoseizures. In review of her medications she is also on Abilify although her past medical history only lists anxiety and depression. She was last evaluated in [facility] by Dr. Randel Pigg on 12/27/13. Patient at that time was reporting weight loss that appear to be related to stress.  Hospital Course:  53y/o woman with hx of seizures/pseudoseizures, migraines, anxiety presenting with recurrent episodes concerning for pseudoseizures vs true seizures. Dilantin level <2.5 and patient reports not taking due to adverse effects. Based on description her events appear consistent with pseudoseizures though with sub-therapeutic dilantin cannot rule out true ictal event.  -d/c dilantin -start keppra 500mg  BID -EEG negative -discharge home with outpatient neurology follow up -Patient is unable to drive, operate heavy machinery, perform activities at heights or participate in water activities until release by outpatient physician.    Procedures:  EEG  Consultations:  neuro  Discharge Exam: Filed Vitals:   08/12/14 1130  BP:  138/92  Pulse:   Temp:   Resp:     General: A+Ox3, NAD Cardiovascular: rrr Respiratory: clear  Discharge  Instructions   Discharge Instructions    Diet general    Complete by:  As directed      Discharge instructions    Complete by:  As directed   Patient is unable to drive, operate heavy machinery, perform activities at heights or participate in water activities until release by outpatient physician          Current Discharge Medication List    START taking these medications   Details  levETIRAcetam (KEPPRA) 500 MG tablet Take 1 tablet (500 mg total) by mouth 2 (two) times daily. Qty: 60 tablet, Refills: 0      CONTINUE these medications which have NOT CHANGED   Details  albuterol (PROVENTIL HFA;VENTOLIN HFA) 108 (90 BASE) MCG/ACT inhaler Inhale 2 puffs into the lungs every 6 (six) hours as needed for wheezing. Qty: 1 Inhaler, Refills: 2    gabapentin (NEURONTIN) 600 MG tablet Take 600 mg by mouth 3 (three) times daily.    ibuprofen (ADVIL,MOTRIN) 200 MG tablet Take 400 mg by mouth every 6 (six) hours as needed for mild pain or moderate pain.    LORazepam (ATIVAN) 0.5 MG tablet Take 1 tablet (0.5 mg total) by mouth every 8 (eight) hours as needed for anxiety. Qty: 30 tablet, Refills: 0   Associated Diagnoses: Depression with anxiety    meloxicam (MOBIC) 7.5 MG tablet Take 7.5 mg by mouth daily.    metoprolol tartrate (LOPRESSOR) 25 MG tablet Take 25 mg by mouth 2 (two) times daily.    ARIPiprazole (ABILIFY) 2 MG tablet TAKE 1 TABLET BY MOUTH ONCE DAILY Qty: 30 tablet, Refills: 0    citalopram (CELEXA) 20 MG tablet TAKE 1 TABLET (20 MG TOTAL) BY MOUTH DAILY. Qty: 30 tablet, Refills: 0    pantoprazole (PROTONIX) 40 MG tablet TAKE 1 TABLET (40 MG TOTAL) BY MOUTH DAILY. Qty: 30 tablet, Refills: 0      STOP taking these medications     phenytoin (DILANTIN) 100 MG ER capsule        No Known Allergies Follow-up Information    Follow up with [name], MD In 1 week.   Specialty:  Family Medicine   Contact information:   [address] [place]  [address] [phone]       Follow up with [facility].   Contact information:   [address] [place] [address] [phone]       The results of significant diagnostics from this hospitalization (including imaging, microbiology, ancillary and laboratory) are listed below for reference.    Significant Diagnostic Studies: Ct Head Wo Contrast  08/11/2014   CLINICAL DATA:  Seizure disorder with fall during seizure hitting back of head. Loss of consciousness and confusion.  EXAM: CT HEAD WITHOUT CONTRAST  TECHNIQUE: Contiguous axial images were obtained from the base of the skull through the vertex without intravenous contrast.  COMPARISON:  07/02/2012  FINDINGS: The ventricles, cisterns and other CSF spaces are normal. There is no mass, mass effect, shift of midline structures or acute hemorrhage. No evidence of acute infarction. Remaining bones and soft tissues are within normal.  IMPRESSION: Normal head CT.   Electronically Signed   By: Marin Olp M.D.   On: 08/11/2014 12:23    Microbiology: No results found for this or any previous visit (from the past 240  hour(s)).   Labs: Basic Metabolic Panel:  Recent Labs Lab 08/11/14 0932  NA 140  K 3.5  CL 108  CO2 21*  GLUCOSE 85  BUN 9  CREATININE 0.51  CALCIUM 9.1   Liver Function Tests:  Recent Labs Lab 08/11/14 0932  AST 20  ALT 14  ALKPHOS 56  BILITOT 0.8  PROT 6.6  ALBUMIN 3.7   No results for input(s): LIPASE, AMYLASE in the last 168 hours. No results for input(s): AMMONIA in the last 168 hours. CBC:  Recent Labs Lab 08/11/14 0932  WBC 9.6  NEUTROABS 7.1  HGB 12.8  HCT 38.0  MCV 82.1  PLT 251   Cardiac Enzymes: No results for input(s): CKTOTAL, CKMB, CKMBINDEX, TROPONINI in the last 168 hours. BNP: BNP (last 3 results) No results for input(s): BNP in the last 8760 hours.  ProBNP (last 3 results) No results for input(s): PROBNP in the last 8760  hours.  CBG: No results for input(s): GLUCAP in the last 168 hours.     SignedEulogio Bear  [facility] 08/12/2014, 1:54 PM

## 2014-08-20 ENCOUNTER — Telehealth: Payer: Self-pay | Admitting: Family Medicine

## 2014-08-20 NOTE — Telephone Encounter (Signed)
Caller name:Jennifer Domzalski Relation to [id number] Call back [place] or [phone] Pharmacy:  Reason for call: pt is needing hospital follow up, daughter only wants pt to see dr. Charlett Blake, wants to know if dr. Charlett Blake can work her in anytime next week if possible. Pt was in the hospital for seizures.

## 2014-08-27 NOTE — Telephone Encounter (Signed)
Called patient and offered appointment.  Lmovm.

## 2014-08-30 ENCOUNTER — Ambulatory Visit (INDEPENDENT_AMBULATORY_CARE_PROVIDER_SITE_OTHER): Payer: 59 | Admitting: Family Medicine

## 2014-08-30 ENCOUNTER — Encounter: Payer: Self-pay | Admitting: Family Medicine

## 2014-08-30 VITALS — BP 137/87 | HR 67 | Temp 98.8°F | Ht 62.0 in | Wt 98.4 lb

## 2014-08-30 DIAGNOSIS — G40909 Epilepsy, unspecified, not intractable, without status epilepticus: Secondary | ICD-10-CM | POA: Diagnosis not present

## 2014-08-30 DIAGNOSIS — F329 Major depressive disorder, single episode, unspecified: Secondary | ICD-10-CM

## 2014-08-30 DIAGNOSIS — I10 Essential (primary) hypertension: Secondary | ICD-10-CM | POA: Diagnosis not present

## 2014-08-30 DIAGNOSIS — F32A Depression, unspecified: Secondary | ICD-10-CM

## 2014-08-30 DIAGNOSIS — L989 Disorder of the skin and subcutaneous tissue, unspecified: Secondary | ICD-10-CM

## 2014-08-30 DIAGNOSIS — F419 Anxiety disorder, unspecified: Secondary | ICD-10-CM

## 2014-08-30 DIAGNOSIS — L729 Follicular cyst of the skin and subcutaneous tissue, unspecified: Secondary | ICD-10-CM

## 2014-08-30 DIAGNOSIS — F418 Other specified anxiety disorders: Secondary | ICD-10-CM | POA: Diagnosis not present

## 2014-08-30 DIAGNOSIS — G43909 Migraine, unspecified, not intractable, without status migrainosus: Secondary | ICD-10-CM

## 2014-08-30 NOTE — Patient Instructions (Signed)

## 2014-08-30 NOTE — Progress Notes (Signed)
Pre visit review using our clinic review tool, if applicable. No additional management support is needed unless otherwise documented below in the visit note. 

## 2014-09-05 ENCOUNTER — Encounter: Payer: Self-pay | Admitting: Family Medicine

## 2014-09-05 DIAGNOSIS — K219 Gastro-esophageal reflux disease without esophagitis: Secondary | ICD-10-CM | POA: Insufficient documentation

## 2014-09-05 DIAGNOSIS — L989 Disorder of the skin and subcutaneous tissue, unspecified: Secondary | ICD-10-CM | POA: Insufficient documentation

## 2014-09-05 HISTORY — DX: Disorder of the skin and subcutaneous tissue, unspecified: L98.9

## 2014-09-05 NOTE — Progress Notes (Signed)
Ruth Bell  [id number] 1960/05/22 09/05/2014      Progress Note-Follow Up  Subjective  Chief Complaint  Chief Complaint  Patient presents with  . Hospitalization Follow-up    HPI  Patient is a 54 y.o. female in today for routine medical care. Patient is in today with her daughter for hospital follow-up. She was admitted to the hospital with possible seizure versus pseudoseizure. Her epileptic meds were changed and they deny any seizure activity since she returned home. They do acknowledge a long history of anxiety and depression dating to her child. She has declined medicines historically is taking Abilify but not the Celexa the hospital gave her. She continues to struggle with anxiety. Is unable to work secondary to her high level of anxiety and depression. Also is complaining of a painful lesion on her left arm. Denies CP/palp/SOB/HA/congestion/fevers/GI or GU c/o. Taking meds as prescribed  Past Medical History  Diagnosis Date  . Seizures     history of pseudoseizure disorder  . Migraine, unspecified, without mention of intractable migraine without mention of status migrainosus   . Anemia   . Menometrorrhagia 2006  . Uterine leiomyoma 2006  . Depression   . Hypertension   . Tubular adenoma of colon 05/2011  . Constipation 12/27/2013  . Arm skin lesion, left 09/05/2014    Past Surgical History  Procedure Laterality Date  . Appendectomy    . Bilateral salpingoophorectomy  04/06/04  . Abdominal hysterectomy  04/06/04  . Rotator cuff repair      Family History  Problem Relation Age of Onset  . Heart disease Mother   . Heart attack Mother   . Cancer Father     colon  . Hypertension Father   . Cancer Sister     brain  . Cancer Brother   . Hypertension Sister   . Cancer Sister     unsure of type  . Diabetes Sister     type 2  . Heart attack Sister   . Hypertension Brother     History   Social History  . Marital Status: Married    Spouse Name: Gwyndolyn Saxon  .  Number of Children: N/A  . Years of Education: N/A   Occupational History  . CMA    Social History Main Topics  . Smoking status: Current Every Day Smoker -- 0.33 packs/day for 40 years    Types: Cigarettes    Start date: 11/17/2013  . Smokeless tobacco: Never Used     Comment: 3-4 cigarettes daily  . Alcohol Use: No  . Drug Use: No  . Sexual Activity: Yes    Birth Control/ Protection: None     Comment: lives with husband, no dietary restrictions.    Other Topics Concern  . Not on file   Social History Narrative    Current Outpatient Prescriptions on File Prior to Visit  Medication Sig Dispense Refill  . ARIPiprazole (ABILIFY) 2 MG tablet TAKE 1 TABLET BY MOUTH ONCE DAILY 30 tablet 0  . citalopram (CELEXA) 20 MG tablet TAKE 1 TABLET (20 MG TOTAL) BY MOUTH DAILY. 30 tablet 0  . levETIRAcetam (KEPPRA) 500 MG tablet Take 1 tablet (500 mg total) by mouth 2 (two) times daily. 60 tablet 0  . ibuprofen (ADVIL,MOTRIN) 200 MG tablet Take 400 mg by mouth every 6 (six) hours as needed for mild pain or moderate pain.    . pantoprazole (PROTONIX) 40 MG tablet TAKE 1 TABLET (40 MG TOTAL) BY MOUTH DAILY. (Patient not  taking: Reported on 08/30/2014) 30 tablet 0   No current facility-administered medications on file prior to visit.    No Known Allergies  Review of Systems  Review of Systems  Constitutional: Negative for fever and malaise/fatigue.  HENT: Negative for congestion.   Eyes: Negative for discharge.  Respiratory: Negative for shortness of breath.   Cardiovascular: Negative for chest pain, palpitations and leg swelling.  Gastrointestinal: Negative for nausea, abdominal pain and diarrhea.  Genitourinary: Negative for dysuria.  Musculoskeletal: Negative for falls.  Skin: Negative for rash.  Neurological: Positive for seizures and headaches. Negative for loss of consciousness.  Endo/Heme/Allergies: Negative for polydipsia.  Psychiatric/Behavioral: Positive for depression.  Negative for suicidal ideas. The patient is nervous/anxious. The patient does not have insomnia.     Objective  BP 137/87 mmHg  Pulse 67  Temp(Src) 98.8 F (37.1 C) (Oral)  Ht 5\' 2"  (1.575 m)  Wt 98 lb 6 oz (44.623 kg)  BMI 17.99 kg/m2  SpO2 100%  Physical Exam  Physical Exam  Constitutional: She is oriented to person, place, and time and well-developed, well-nourished, and in no distress. No distress.  HENT:  Head: Normocephalic and atraumatic.  Eyes: Conjunctivae are normal.  Neck: Neck supple. No thyromegaly present.  Cardiovascular: Normal rate, regular rhythm and normal heart sounds.   No murmur heard. Pulmonary/Chest: Effort normal and breath sounds normal. She has no wheezes.  Abdominal: She exhibits no distension and no mass.  Musculoskeletal: She exhibits no edema.  Lymphadenopathy:    She has no cervical adenopathy.  Neurological: She is alert and oriented to person, place, and time.  Skin: Skin is warm and dry. No rash noted. She is not diaphoretic.  Oval shaped 1 cm x 1/2 cm lesion on left arm. erythematous  Psychiatric: Memory, affect and judgment normal.    Lab Results  Component Value Date   TSH 0.797 02/24/2012   Lab Results  Component Value Date   WBC 9.6 08/11/2014   HGB 12.8 08/11/2014   HCT 38.0 08/11/2014   MCV 82.1 08/11/2014   PLT 251 08/11/2014   Lab Results  Component Value Date   CREATININE 0.51 08/11/2014   BUN 9 08/11/2014   NA 140 08/11/2014   K 3.5 08/11/2014   CL 108 08/11/2014   CO2 21* 08/11/2014   Lab Results  Component Value Date   ALT 14 08/11/2014   AST 20 08/11/2014   ALKPHOS 56 08/11/2014   BILITOT 0.8 08/11/2014   No results found for: CHOL No results found for: HDL No results found for: LDLCALC No results found for: TRIG No results found for: CHOLHDL   Assessment & Plan  HTN (hypertension) Well controlled, no changes to meds. Encouraged heart healthy diet such as the DASH diet and exercise as tolerated.     Seizure disorder Question seizure vs pseudoseizure disorder has appt with Dr Sabra Heck of Neurology at [facility] on 09/15/14  Anxiety and depression Patient has not been taking the Celexa prescribed by the hospital but agrees to start and follow up with psychiatry. Has an appt with [facility] to manage her care.   Arm skin lesion, left Cyst on left arm present for several years but painful, referred to general surgery for further consideration  Migraine Encouraged increased hydration, 64 ounces of clear fluids daily. Minimize alcohol and caffeine. Eat small frequent meals with lean proteins and complex carbs. Avoid high and low blood sugars. Get adequate sleep, 7-8 hours a night. Needs exercise daily preferably in  the morning.

## 2014-09-05 NOTE — Assessment & Plan Note (Signed)
Patient has not been taking the Celexa prescribed by the hospital but agrees to start and follow up with psychiatry. Has an appt with [facility] to manage her care.

## 2014-09-05 NOTE — Assessment & Plan Note (Signed)
Question seizure vs pseudoseizure disorder has appt with Dr Sabra Heck of Neurology at [facility] on 09/15/14

## 2014-09-05 NOTE — Assessment & Plan Note (Signed)
Cyst on left arm present for several years but painful, referred to general surgery for further consideration

## 2014-09-05 NOTE — Assessment & Plan Note (Signed)
Well controlled, no changes to meds. Encouraged heart healthy diet such as the DASH diet and exercise as tolerated.  °

## 2014-09-05 NOTE — Assessment & Plan Note (Signed)
Encouraged increased hydration, 64 ounces of clear fluids daily. Minimize alcohol and caffeine. Eat small frequent meals with lean proteins and complex carbs. Avoid high and low blood sugars. Get adequate sleep, 7-8 hours a night. Needs exercise daily preferably in the morning.  

## 2014-09-06 ENCOUNTER — Encounter: Payer: Self-pay | Admitting: Family Medicine

## 2014-09-06 ENCOUNTER — Other Ambulatory Visit: Payer: Self-pay | Admitting: Family Medicine

## 2014-09-08 ENCOUNTER — Telehealth: Payer: Self-pay | Admitting: Family Medicine

## 2014-09-08 NOTE — Telephone Encounter (Signed)
Pt should be able to call her insurance and they should be able to give her a "provider acceptance" list for the area for psychiatrists.

## 2014-09-08 NOTE — Telephone Encounter (Signed)
Caller name: Wyllow Seigler Relationship to patient: self Can be reached: [phone]  Reason for call: Pt said that she has been trying to get psychiatrist appt and no one accepts her insurance. I gave her ph # for [facility] in our office. She is going to call them. She asked if you know anyone else that would accept her insurance?

## 2014-11-19 ENCOUNTER — Encounter (HOSPITAL_BASED_OUTPATIENT_CLINIC_OR_DEPARTMENT_OTHER): Payer: Self-pay

## 2014-11-19 ENCOUNTER — Emergency Department (HOSPITAL_BASED_OUTPATIENT_CLINIC_OR_DEPARTMENT_OTHER)
Admission: EM | Admit: 2014-11-19 | Discharge: 2014-11-19 | Disposition: A | Discharge status: Home / Self Care | Payer: 59 | Attending: Emergency Medicine | Admitting: Emergency Medicine

## 2014-11-19 ENCOUNTER — Emergency Department (HOSPITAL_BASED_OUTPATIENT_CLINIC_OR_DEPARTMENT_OTHER): Payer: 59

## 2014-11-19 ENCOUNTER — Ambulatory Visit (INDEPENDENT_AMBULATORY_CARE_PROVIDER_SITE_OTHER): Payer: 59 | Admitting: Physician Assistant

## 2014-11-19 ENCOUNTER — Encounter: Payer: Self-pay | Admitting: Physician Assistant

## 2014-11-19 VITALS — BP 142/84 | HR 95 | Temp 98.1°F | Resp 16 | Ht 62.0 in | Wt 99.5 lb

## 2014-11-19 DIAGNOSIS — R1110 Vomiting, unspecified: Secondary | ICD-10-CM | POA: Insufficient documentation

## 2014-11-19 DIAGNOSIS — Z720 Tobacco use: Secondary | ICD-10-CM | POA: Insufficient documentation

## 2014-11-19 DIAGNOSIS — Z79899 Other long term (current) drug therapy: Secondary | ICD-10-CM | POA: Insufficient documentation

## 2014-11-19 DIAGNOSIS — M5442 Lumbago with sciatica, left side: Secondary | ICD-10-CM | POA: Diagnosis not present

## 2014-11-19 DIAGNOSIS — R300 Dysuria: Secondary | ICD-10-CM | POA: Diagnosis not present

## 2014-11-19 DIAGNOSIS — I10 Essential (primary) hypertension: Secondary | ICD-10-CM | POA: Diagnosis not present

## 2014-11-19 DIAGNOSIS — M5416 Radiculopathy, lumbar region: Secondary | ICD-10-CM | POA: Diagnosis not present

## 2014-11-19 DIAGNOSIS — Z8719 Personal history of other diseases of the digestive system: Secondary | ICD-10-CM | POA: Insufficient documentation

## 2014-11-19 DIAGNOSIS — F329 Major depressive disorder, single episode, unspecified: Secondary | ICD-10-CM | POA: Diagnosis not present

## 2014-11-19 DIAGNOSIS — G40909 Epilepsy, unspecified, not intractable, without status epilepticus: Secondary | ICD-10-CM | POA: Diagnosis not present

## 2014-11-19 DIAGNOSIS — Z862 Personal history of diseases of the blood and blood-forming organs and certain disorders involving the immune mechanism: Secondary | ICD-10-CM | POA: Insufficient documentation

## 2014-11-19 DIAGNOSIS — Z8742 Personal history of other diseases of the female genital tract: Secondary | ICD-10-CM | POA: Insufficient documentation

## 2014-11-19 DIAGNOSIS — Z872 Personal history of diseases of the skin and subcutaneous tissue: Secondary | ICD-10-CM | POA: Diagnosis not present

## 2014-11-19 DIAGNOSIS — Z86018 Personal history of other benign neoplasm: Secondary | ICD-10-CM | POA: Diagnosis not present

## 2014-11-19 DIAGNOSIS — M546 Pain in thoracic spine: Secondary | ICD-10-CM | POA: Diagnosis present

## 2014-11-19 LAB — CBC
HCT: 43.2 % (ref 36.0–46.0)
Hemoglobin: 14.7 g/dL (ref 12.0–15.0)
MCH: 27.7 pg (ref 26.0–34.0)
MCHC: 34 g/dL (ref 30.0–36.0)
MCV: 81.5 fL (ref 78.0–100.0)
Platelets: 278 10*3/uL (ref 150–400)
RBC: 5.3 MIL/uL — ABNORMAL HIGH (ref 3.87–5.11)
RDW: 14.2 % (ref 11.5–15.5)
WBC: 9.9 10*3/uL (ref 4.0–10.5)

## 2014-11-19 LAB — URINE MICROSCOPIC-ADD ON

## 2014-11-19 LAB — COMPREHENSIVE METABOLIC PANEL
ALT: 18 U/L (ref 14–54)
AST: 28 U/L (ref 15–41)
Albumin: 4.4 g/dL (ref 3.5–5.0)
Alkaline Phosphatase: 69 U/L (ref 38–126)
Anion gap: 12 (ref 5–15)
BUN: 9 mg/dL (ref 6–20)
CO2: 26 mmol/L (ref 22–32)
Calcium: 9.6 mg/dL (ref 8.9–10.3)
Chloride: 99 mmol/L — ABNORMAL LOW (ref 101–111)
Creatinine, Ser: 0.62 mg/dL (ref 0.44–1.00)
GFR calc Af Amer: >60 mL/min (ref >60)
GFR calc non Af Amer: >60 mL/min (ref >60)
Glucose, Bld: 107 mg/dL — ABNORMAL HIGH (ref 65–99)
Potassium: 4 mmol/L (ref 3.5–5.1)
Sodium: 137 mmol/L (ref 135–145)
Total Bilirubin: 0.4 mg/dL (ref 0.3–1.2)
Total Protein: 8.7 g/dL — ABNORMAL HIGH (ref 6.5–8.1)

## 2014-11-19 LAB — URINALYSIS, ROUTINE W REFLEX MICROSCOPIC
Bilirubin Urine: NEGATIVE
Glucose, UA: NEGATIVE mg/dL
Ketones, ur: NEGATIVE mg/dL
Nitrite: NEGATIVE
Protein, ur: 30 mg/dL — AB
Specific Gravity, Urine: 1.018 (ref 1.005–1.030)
Urobilinogen, UA: 1 mg/dL (ref 0.0–1.0)
pH: 6.5 (ref 5.0–8.0)

## 2014-11-19 LAB — LIPASE, BLOOD: Lipase: 27 U/L (ref 22–51)

## 2014-11-19 MED ORDER — HYDROMORPHONE HCL 1 MG/ML IJ SOLN
1.0000 mg | Freq: Once | INTRAMUSCULAR | Status: AC
  Administered 2014-11-19: 1 mg via INTRAVENOUS
  Filled 2014-11-19: qty 1

## 2014-11-19 MED ORDER — HYDROCODONE-ACETAMINOPHEN 5-325 MG PO TABS: 1.0000 | ORAL_TABLET | Freq: Four times a day (QID) | ORAL | Status: DC | PRN

## 2014-11-19 MED ORDER — IOHEXOL 300 MG/ML  SOLN
100.0000 mL | Freq: Once | INTRAMUSCULAR | Status: AC | PRN
  Administered 2014-11-19: 100 mL via INTRAVENOUS

## 2014-11-19 MED ORDER — KETOROLAC TROMETHAMINE 30 MG/ML IJ SOLN
30.0000 mg | Freq: Once | INTRAMUSCULAR | Status: AC
  Administered 2014-11-19: 30 mg via INTRAVENOUS
  Filled 2014-11-19: qty 1

## 2014-11-19 MED ORDER — SODIUM CHLORIDE 0.9 % IV BOLUS (SEPSIS)
1000.0000 mL | Freq: Once | INTRAVENOUS | Status: AC
  Administered 2014-11-19: 1000 mL via INTRAVENOUS

## 2014-11-19 MED ORDER — IOHEXOL 300 MG/ML  SOLN
25.0000 mL | Freq: Once | INTRAMUSCULAR | Status: AC | PRN
  Administered 2014-11-19: 25 mL via ORAL

## 2014-11-19 MED ORDER — CYCLOBENZAPRINE HCL 10 MG PO TABS
10.0000 mg | ORAL_TABLET | Freq: Two times a day (BID) | ORAL | Status: DC | PRN
Start: 2014-11-19 — End: 2014-12-07

## 2014-11-19 NOTE — Progress Notes (Signed)
Pre visit review using our clinic review tool, if applicable. No additional management support is needed unless otherwise documented below in the visit note/SLS  

## 2014-11-19 NOTE — Progress Notes (Signed)
Patient presents to clinic today c/o severe low back pain x 2 weeks, worsening with radiation into legs associated with numbness and incontinence since this AM. Denies trauma or injury to back. Does have history of lumbar radiculopathy. Also notes nausea associated with emesis of black tarry substance. Denies use of anti-inflammatories or alcohol consumption.  Past Medical History  Diagnosis Date  . Seizures     history of pseudoseizure disorder  . Migraine, unspecified, without mention of intractable migraine without mention of status migrainosus   . Anemia   . Menometrorrhagia 2006  . Uterine leiomyoma 2006  . Depression   . Hypertension   . Tubular adenoma of colon 05/2011  . Constipation 12/27/2013  . Arm skin lesion, left 09/05/2014    Current Outpatient Prescriptions on File Prior to Visit  Medication Sig Dispense Refill  . ARIPiprazole (ABILIFY) 2 MG tablet TAKE 1 TABLET BY MOUTH ONCE DAILY 30 tablet 0  . citalopram (CELEXA) 20 MG tablet TAKE 1 TABLET (20 MG TOTAL) BY MOUTH DAILY. 30 tablet 0  . levETIRAcetam (KEPPRA) 500 MG tablet Take 1 tablet (500 mg total) by mouth 2 (two) times daily. 60 tablet 0  . pantoprazole (PROTONIX) 40 MG tablet TAKE 1 TABLET (40 MG TOTAL) BY MOUTH DAILY. 30 tablet 11  . ibuprofen (ADVIL,MOTRIN) 200 MG tablet Take 400 mg by mouth every 6 (six) hours as needed for mild pain or moderate pain.     No current facility-administered medications on file prior to visit.    No Known Allergies  Family History  Problem Relation Age of Onset  . Heart disease Mother   . Heart attack Mother   . Cancer Father     colon  . Hypertension Father   . Cancer Sister     brain  . Cancer Brother   . Hypertension Sister   . Cancer Sister     unsure of type  . Diabetes Sister     type 2  . Heart attack Sister   . Hypertension Brother     Social History   Social History  . Marital Status: Married    Spouse Name: Gwyndolyn Saxon  . Number of Children: N/A  .  Years of Education: N/A   Occupational History  . CMA    Social History Main Topics  . Smoking status: Current Every Day Smoker -- 0.33 packs/day for 40 years    Types: Cigarettes    Start date: 11/17/2013  . Smokeless tobacco: Never Used     Comment: 3-4 cigarettes daily  . Alcohol Use: No  . Drug Use: No  . Sexual Activity: Yes    Birth Control/ Protection: None     Comment: lives with husband, no dietary restrictions.    Other Topics Concern  . None   Social History Narrative   Review of Systems - See HPI.  All other ROS are negative.  BP 142/84 mmHg  Pulse 95  Temp(Src) 98.1 F (36.7 C) (Oral)  Resp 16  Ht 5\' 2"  (1.575 m)  Wt 99 lb 8 oz (45.133 kg)  BMI 18.19 kg/m2  SpO2 98%  Physical Exam  Constitutional: She appears distressed.  HENT:  Head: Normocephalic and atraumatic.  Cardiovascular: Normal rate, regular rhythm, normal heart sounds and intact distal pulses.   Pulmonary/Chest: Effort normal.  Abdominal: There is tenderness.  Musculoskeletal:       Cervical back: She exhibits tenderness and pain.       Thoracic back: She exhibits tenderness  and pain.       Lumbar back: She exhibits tenderness and pain.    Assessment/Plan: Lumbar radiculopathy Concern for cauda equina giving symptoms. Needs emergent assessment for alarm symptoms. Charge nurse notified. Patient moved to ER via wheelchair by RN.

## 2014-11-19 NOTE — Assessment & Plan Note (Signed)
Concern for cauda equina giving symptoms. Needs emergent assessment for alarm symptoms. Charge nurse notified. Patient moved to ER via wheelchair by RN.

## 2014-11-19 NOTE — ED Notes (Signed)
Mid upper back pain x 1 week-denies injury-was seen by PCP in house and brought to ED via w/c

## 2014-11-19 NOTE — ED Provider Notes (Signed)
CSN: [id number]     Arrival date & time 11/19/14  1541 History   First MD Initiated Contact with Patient 11/19/14 1548     Chief Complaint  Patient presents with  . Back Pain     (Consider location/radiation/quality/duration/timing/severity/associated sxs/prior Treatment) Patient is a 54 y.o. female presenting with back pain. The history is provided by the patient.  Back Pain Location:  Thoracic spine Quality:  Aching Radiates to:  Does not radiate Pain severity:  Moderate Onset quality:  Gradual Duration:  1 week Timing:  Constant Progression:  Unchanged Chronicity:  New Context: not jumping from heights, not recent illness and not recent injury   Relieved by:  Nothing Worsened by:  Nothing tried Associated symptoms: abdominal pain, dysuria and fever (intermittent for 2 days)   Associated symptoms comment:  Vomiting for 2 days   Past Medical History  Diagnosis Date  . Seizures     history of pseudoseizure disorder  . Migraine, unspecified, without mention of intractable migraine without mention of status migrainosus   . Anemia   . Menometrorrhagia 2006  . Uterine leiomyoma 2006  . Depression   . Hypertension   . Tubular adenoma of colon 05/2011  . Constipation 12/27/2013  . Arm skin lesion, left 09/05/2014   Past Surgical History  Procedure Laterality Date  . Appendectomy    . Bilateral salpingoophorectomy  04/06/04  . Abdominal hysterectomy  04/06/04  . Rotator cuff repair     Family History  Problem Relation Age of Onset  . Heart disease Mother   . Heart attack Mother   . Cancer Father     colon  . Hypertension Father   . Cancer Sister     brain  . Cancer Brother   . Hypertension Sister   . Cancer Sister     unsure of type  . Diabetes Sister     type 2  . Heart attack Sister   . Hypertension Brother    Social History  Substance Use Topics  . Smoking status: Current Every Day Smoker -- 0.33 packs/day for 40 years    Types: Cigarettes    Start date:  11/17/2013  . Smokeless tobacco: Never Used     Comment: 3-4 cigarettes daily  . Alcohol Use: No   OB History    No data available     Review of Systems  Constitutional: Positive for fever (intermittent for 2 days).  Respiratory: Negative for cough and shortness of breath.   Gastrointestinal: Positive for vomiting and abdominal pain.  Genitourinary: Positive for dysuria.  Musculoskeletal: Positive for back pain.  All other systems reviewed and are negative.     Allergies  Review of patient's allergies indicates no known allergies.  Home Medications   Prior to Admission medications   Medication Sig Start Date End Date Taking? Authorizing Provider  ARIPiprazole (ABILIFY) 2 MG tablet TAKE 1 TABLET BY MOUTH ONCE DAILY 08/12/14   Mosie Lukes, MD  citalopram (CELEXA) 20 MG tablet TAKE 1 TABLET (20 MG TOTAL) BY MOUTH DAILY. 08/12/14   Mosie Lukes, MD  ibuprofen (ADVIL,MOTRIN) 200 MG tablet Take 400 mg by mouth every 6 (six) hours as needed for mild pain or moderate pain.    Historical Provider, MD  levETIRAcetam (KEPPRA) 500 MG tablet Take 1 tablet (500 mg total) by mouth 2 (two) times daily. 08/12/14   Geradine Girt, DO  pantoprazole (PROTONIX) 40 MG tablet TAKE 1 TABLET (40 MG TOTAL) BY MOUTH DAILY. 09/06/14  Mosie Lukes, MD   BP 154/97 mmHg  Pulse 85  Temp(Src) 98.8 F (37.1 C) (Oral)  Resp 20  Ht 5\' 2"  (1.575 m)  Wt 99 lb (44.906 kg)  BMI 18.10 kg/m2  SpO2 99% Physical Exam  Constitutional: She is oriented to person, place, and time. She appears well-developed and well-nourished. No distress.  HENT:  Head: Normocephalic and atraumatic.  Mouth/Throat: Oropharynx is clear and moist.  Eyes: EOM are normal. Pupils are equal, round, and reactive to light.  Neck: Normal range of motion. Neck supple.  Cardiovascular: Normal rate and regular rhythm.  Exam reveals no friction rub.   No murmur heard. Pulmonary/Chest: Effort normal and breath sounds normal. No respiratory  distress. She has no wheezes. She has no rales.  Abdominal: Soft. She exhibits no distension. There is tenderness (L CVA tenderness, diffuse abdominal pain). There is no rebound.  Musculoskeletal: Normal range of motion. She exhibits no edema.  Neurological: She is alert and oriented to person, place, and time. No cranial nerve deficit. She exhibits normal muscle tone. Coordination normal.  Skin: No rash noted. She is not diaphoretic.  Nursing note and vitals reviewed.   ED Course  Procedures (including critical care time) Labs Review Labs Reviewed  CBC - Abnormal; Notable for the following:    RBC 5.30 (*)    All other components within normal limits  COMPREHENSIVE METABOLIC PANEL - Abnormal; Notable for the following:    Chloride 99 (*)    Glucose, Bld 107 (*)    Total Protein 8.7 (*)    All other components within normal limits  URINALYSIS, ROUTINE W REFLEX MICROSCOPIC (NOT AT [facility]) - Abnormal; Notable for the following:    Hgb urine dipstick LARGE (*)    Protein, ur 30 (*)    Leukocytes, UA SMALL (*)    All other components within normal limits  URINE MICROSCOPIC-ADD ON - Abnormal; Notable for the following:    Bacteria, UA FEW (*)    All other components within normal limits  URINE CULTURE  LIPASE, BLOOD    Imaging Review Ct Abdomen Pelvis W Contrast  11/19/2014   CLINICAL DATA:  54 year old female with abdominal pain, abdominal distention, hematuria and nausea and vomiting.  EXAM: CT ABDOMEN AND PELVIS WITH CONTRAST  TECHNIQUE: Multidetector CT imaging of the abdomen and pelvis was performed using the standard protocol following bolus administration of intravenous contrast.  CONTRAST:  128mL OMNIPAQUE IOHEXOL 300 MG/ML  SOLN  COMPARISON:  06/19/2011 CT.  FINDINGS: Lower chest:  Unremarkable  Hepatobiliary: The liver and gallbladder are unremarkable. There is no evidence of biliary dilatation.  Pancreas: Unremarkable  Spleen: Unremarkable  Adrenals/Urinary Tract: The kidneys,  adrenal glands and bladder are unremarkable except for a stable right renal cyst.  Stomach/Bowel: Unremarkable. There is no evidence of bowel obstruction or definite bowel wall thickening.  Vascular/Lymphatic: No enlarged lymph nodes or abdominal aortic aneurysm identified. Aortic atherosclerotic calcifications are noted.  Reproductive: Patient is status post hysterectomy. There is no evidence of adnexal mass.  Other: No free fluid, abscess or pneumoperitoneum.  Musculoskeletal: No acute or suspicious abnormalities.  IMPRESSION: No acute or significant abnormalities.   Electronically Signed   By: Margarette Canada M.D.   On: 11/19/2014 18:27   I have personally reviewed and evaluated these images and lab results as part of my medical decision-making.   EKG Interpretation None      MDM   Final diagnoses:  Left-sided low back pain with left-sided sciatica  54 year old female here with back pain. Began a week ago. She's had dysuria for the past few days with associated vomiting and subjective fevers. There are no alleviating or exacerbating factors. Here vitals are stable. She is very uncomfortable. She has left CVA tenderness with diffuse abdominal pain. She does have some chronic urinary incontinence, no acute urinary incontinence. We will scan to look for possible intra-abdominal process. She was sent from her PCP's office since she's had urinary incontinence, however she's had that for 6 months. Reflexes normal. No complaints of bowel incontinence or saddle anesthesia. Doubt cord compression. Patient's CT and urine are normal. Her back pain resolved with pain meds, likely musculoskeletal.   Evelina Bucy, MD 11/19/14 2322

## 2014-11-19 NOTE — ED Notes (Addendum)
Assumed care from Amy, RN.  Patient is stable, calm and sitting on the side of the bed.  Pt is not presently in distress.  She is answering questions appropriately.  She denies sweating, blurred vision or tremors.

## 2014-11-19 NOTE — Discharge Instructions (Signed)
Back Pain, Adult °Back pain is very common. The pain often gets better over time. The cause of back pain is usually not dangerous. Most people can learn to manage their back pain on their own.  °HOME CARE  °· Stay active. Start with short walks on flat ground if you can. Try to walk farther each day. °· Do not sit, drive, or stand in one place for more than 30 minutes. Do not stay in bed. °· Do not avoid exercise or work. Activity can help your back heal faster. °· Be careful when you bend or lift an object. Bend at your knees, keep the object close to you, and do not twist. °· Sleep on a firm mattress. Lie on your side, and bend your knees. If you lie on your back, put a pillow under your knees. °· Only take medicines as told by your doctor. °· Put ice on the injured area. °¨ Put ice in a plastic bag. °¨ Place a towel between your skin and the bag. °¨ Leave the ice on for 15-20 minutes, 03-04 times a day for the first 2 to 3 days. After that, you can switch between ice and heat packs. °· Ask your doctor about back exercises or massage. °· Avoid feeling anxious or stressed. Find good ways to deal with stress, such as exercise. °GET HELP RIGHT AWAY IF:  °· Your pain does not go away with rest or medicine. °· Your pain does not go away in 1 week. °· You have new problems. °· You do not feel well. °· The pain spreads into your legs. °· You cannot control when you poop (bowel movement) or pee (urinate). °· Your arms or legs feel weak or lose feeling (numbness). °· You feel sick to your stomach (nauseous) or throw up (vomit). °· You have belly (abdominal) pain. °· You feel like you may pass out (faint). °MAKE SURE YOU:  °· Understand these instructions. °· Will watch your condition. °· Will get help right away if you are not doing well or get worse. °Document Released: 08/22/2007 Document Revised: 05/28/2011 Document Reviewed: 07/07/2013 °ExitCare® Patient Information ©2015 [employer]. This information is not intended  to replace advice given to you by your health care provider. Make sure you discuss any questions you have with your health care provider. ° °

## 2014-11-22 LAB — URINE CULTURE
Culture: >=100000
Special Requests: NORMAL

## 2014-11-23 ENCOUNTER — Telehealth (HOSPITAL_BASED_OUTPATIENT_CLINIC_OR_DEPARTMENT_OTHER): Payer: Self-pay | Admitting: Emergency Medicine

## 2014-11-23 NOTE — Progress Notes (Signed)
ED Antimicrobial Stewardship Positive Culture Follow Up   Ruth Bell is an 54 y.o. female who presented to [facility] on 11/19/2014 with a chief complaint of  Chief Complaint  Patient presents with  . Back Pain    Recent Results (from the past 720 hour(s))  Urine culture     Status: None   Collection Time: 11/19/14  5:00 PM  Result Value Ref Range Status   Specimen Description URINE, CLEAN CATCH  Final   Special Requests Normal  Final   Culture   Final    >=100,000 COLONIES/mL ESCHERICHIA COLI Performed at [facility]    Report Status 11/22/2014 FINAL  Final   Organism ID, Bacteria ESCHERICHIA COLI  Final      Susceptibility   Escherichia coli - MIC*    AMPICILLIN <=2 SENSITIVE Sensitive     CEFAZOLIN <=4 SENSITIVE Sensitive     CEFTRIAXONE <=1 SENSITIVE Sensitive     CIPROFLOXACIN <=0.25 SENSITIVE Sensitive     GENTAMICIN <=1 SENSITIVE Sensitive     IMIPENEM <=0.25 SENSITIVE Sensitive     NITROFURANTOIN <=16 SENSITIVE Sensitive     TRIMETH/SULFA <=20 SENSITIVE Sensitive     AMPICILLIN/SULBACTAM <=2 SENSITIVE Sensitive     PIP/TAZO <=4 SENSITIVE Sensitive     * >=100,000 COLONIES/mL ESCHERICHIA COLI    [x]  Patient discharged originally without antimicrobial agent and treatment is now indicated  New antibiotic prescription: Cephalexin 500 mg PO BID x 7 days  ED Provider: Jeannett Senior, PA-C   Joya San, PharmD Clinical Pharmacy Resident Pager # [phone] 11/23/2014 9:43 AM  Infectious Diseases Pharmacist Phone# [phone]

## 2014-11-23 NOTE — Telephone Encounter (Signed)
Post ED Visit - Positive Culture Follow-up: Successful Patient Follow-Up  Culture assessed and recommendations reviewed by: []  Heide Guile, Pharm.D., BCPS-AQ ID []  Alycia Rossetti, Pharm.D., BCPS []  Watts, Pharm.D., BCPS, AAHIVP []  Legrand Como, Pharm.D., BCPS, AAHIVP [x]  Cristy Reyes, Pharm.D. []  Milus Glazier, [place].D.  Positive urine  E. Coli culture  [x]  Patient discharged without antimicrobial prescription and treatment is now indicated []  Organism is resistant to prescribed ED discharge antimicrobial []  Patient with positive blood cultures  Changes discussed with ED provider: Jeannett Senior PA New antibiotic prescription Keflex 500 mg po bid x 7 days Called to [facility] [phone]  Contacted patient, 11/23/14 1110   Hazle Nordmann 11/23/2014, 11:10 AM

## 2014-12-02 ENCOUNTER — Ambulatory Visit: Payer: 59 | Admitting: Family Medicine

## 2014-12-02 ENCOUNTER — Telehealth: Payer: Self-pay | Admitting: Family Medicine

## 2014-12-07 ENCOUNTER — Encounter: Payer: Self-pay | Admitting: Family Medicine

## 2014-12-07 ENCOUNTER — Ambulatory Visit (INDEPENDENT_AMBULATORY_CARE_PROVIDER_SITE_OTHER): Payer: 59 | Admitting: Family Medicine

## 2014-12-07 VITALS — BP 142/80 | HR 87 | Temp 98.1°F | Ht 62.0 in | Wt 104.4 lb

## 2014-12-07 DIAGNOSIS — I10 Essential (primary) hypertension: Secondary | ICD-10-CM

## 2014-12-07 DIAGNOSIS — M5441 Lumbago with sciatica, right side: Secondary | ICD-10-CM | POA: Diagnosis not present

## 2014-12-07 DIAGNOSIS — M5416 Radiculopathy, lumbar region: Secondary | ICD-10-CM

## 2014-12-07 MED ORDER — CARISOPRODOL 350 MG PO TABS: 350.0000 mg | ORAL_TABLET | Freq: Four times a day (QID) | ORAL | Status: DC | PRN

## 2014-12-07 MED ORDER — PREDNISONE 50 MG PO TABS: ORAL_TABLET | ORAL | Status: DC

## 2014-12-07 MED ORDER — METHYLPREDNISOLONE ACETATE 40 MG/ML IJ SUSP
40.0000 mg | Freq: Once | INTRAMUSCULAR | Status: AC
  Administered 2014-12-07: 40 mg via INTRAMUSCULAR

## 2014-12-07 NOTE — Patient Instructions (Signed)
Back Pain, Adult Low back pain is very common. About 1 in 5 people have back pain.The cause of low back pain is rarely dangerous. The pain often gets better over time.About half of people with a sudden onset of back pain feel better in just 2 weeks. About 8 in 10 people feel better by 6 weeks.  CAUSES Some common causes of back pain include:  Strain of the muscles or ligaments supporting the spine.  Wear and tear (degeneration) of the spinal discs.  Arthritis.  Direct injury to the back. DIAGNOSIS Most of the time, the direct cause of low back pain is not known.However, back pain can be treated effectively even when the exact cause of the pain is unknown.Answering your caregiver's questions about your overall health and symptoms is one of the most accurate ways to make sure the cause of your pain is not dangerous. If your caregiver needs more information, he or she may order lab work or imaging tests (X-rays or MRIs).However, even if imaging tests show changes in your back, this usually does not require surgery. HOME CARE INSTRUCTIONS For many people, back pain returns.Since low back pain is rarely dangerous, it is often a condition that people can learn to manageon their own.   Remain active. It is stressful on the back to sit or stand in one place. Do not sit, drive, or stand in one place for more than 30 minutes at a time. Take short walks on level surfaces as soon as pain allows.Try to increase the length of time you walk each day.  Do not stay in bed.Resting more than 1 or 2 days can delay your recovery.  Do not avoid exercise or work.Your body is made to move.It is not dangerous to be active, even though your back may hurt.Your back will likely heal faster if you return to being active before your pain is gone.  Pay attention to your body when you bend and lift. Many people have less discomfortwhen lifting if they bend their knees, keep the load close to their bodies,and  avoid twisting. Often, the most comfortable positions are those that put less stress on your recovering back.  Find a comfortable position to sleep. Use a firm mattress and lie on your side with your knees slightly bent. If you lie on your back, put a pillow under your knees.  Only take over-the-counter or prescription medicines as directed by your caregiver. Over-the-counter medicines to reduce pain and inflammation are often the most helpful.Your caregiver may prescribe muscle relaxant drugs.These medicines help dull your pain so you can more quickly return to your normal activities and healthy exercise.  Put ice on the injured area.  Put ice in a plastic bag.  Place a towel between your skin and the bag.  Leave the ice on for 15-20 minutes, 03-04 times a day for the first 2 to 3 days. After that, ice and heat may be alternated to reduce pain and spasms.  Ask your caregiver about trying back exercises and gentle massage. This may be of some benefit.  Avoid feeling anxious or stressed.Stress increases muscle tension and can worsen back pain.It is important to recognize when you are anxious or stressed and learn ways to manage it.Exercise is a great option. SEEK MEDICAL CARE IF:  You have pain that is not relieved with rest or medicine.  You have pain that does not improve in 1 week.  You have new symptoms.  You are generally not feeling well. SEEK   IMMEDIATE MEDICAL CARE IF:   You have pain that radiates from your back into your legs.  You develop new bowel or bladder control problems.  You have unusual weakness or numbness in your arms or legs.  You develop nausea or vomiting.  You develop abdominal pain.  You feel faint. Document Released: 03/05/2005 Document Revised: 09/04/2011 Document Reviewed: 07/07/2013 ExitCare Patient Information 2015 [employer]. This information is not intended to replace advice given to you by your health care provider. Make sure you  discuss any questions you have with your health care provider.  

## 2014-12-07 NOTE — Progress Notes (Signed)
Pre visit review using our clinic review tool, if applicable. No additional management support is needed unless otherwise documented below in the visit note. 

## 2014-12-13 ENCOUNTER — Encounter: Payer: Self-pay | Admitting: Family Medicine

## 2014-12-13 NOTE — Assessment & Plan Note (Signed)
Worsening pain. Given steroid shot in office and placed on Prednisone 50 mg daily x 5 days. Encouraged movement as tolerated and proceed with referral if does not improve. Ordered xray once complete can consider MRI

## 2014-12-13 NOTE — Progress Notes (Signed)
Subjective:    Patient ID: Ruth Bell, female    DOB: July 05, 1960, 54 y.o.   MRN: [id number]  Chief Complaint  Patient presents with  . Follow-up    HPI Patient is in today for evaluation of back pain. She is crying and agitated. She has trouble finding a comfortable position. She has pain throughout her entire back but the severe pain today is in the lumbar region radiating down her right leg. She moves around frequently in the visit and says she has trouble sleeping or finding a consult position at home. Does note some mild intermittent urinary incontinence but no bowel incontinence. Denies dysuria or hematuria. No change in bowel habits. No trauma or falls. Denies CP/palp/SOB/HA/congestion/fevers/GI or GU c/o. Taking meds as prescribed  Past Medical History  Diagnosis Date  . Seizures     history of pseudoseizure disorder  . Migraine, unspecified, without mention of intractable migraine without mention of status migrainosus   . Anemia   . Menometrorrhagia 2006  . Uterine leiomyoma 2006  . Depression   . Hypertension   . Tubular adenoma of colon 05/2011  . Constipation 12/27/2013  . Arm skin lesion, left 09/05/2014    Past Surgical History  Procedure Laterality Date  . Appendectomy    . Bilateral salpingoophorectomy  04/06/04  . Abdominal hysterectomy  04/06/04  . Rotator cuff repair      Family History  Problem Relation Age of Onset  . Heart disease Mother   . Heart attack Mother   . Cancer Father     colon  . Hypertension Father   . Cancer Sister     brain  . Cancer Brother   . Hypertension Sister   . Cancer Sister     unsure of type  . Diabetes Sister     type 2  . Heart attack Sister   . Hypertension Brother     Social History   Social History  . Marital Status: Married    Spouse Name: Gwyndolyn Saxon  . Number of Children: N/A  . Years of Education: N/A   Occupational History  . CMA    Social History Main Topics  . Smoking status: Current Every Day  Smoker -- 0.33 packs/day for 40 years    Types: Cigarettes    Start date: 11/17/2013  . Smokeless tobacco: Never Used     Comment: 3-4 cigarettes daily  . Alcohol Use: No  . Drug Use: No  . Sexual Activity: Not on file     Comment: lives with husband, no dietary restrictions.    Other Topics Concern  . Not on file   Social History Narrative    Outpatient Prescriptions Prior to Visit  Medication Sig Dispense Refill  . ARIPiprazole (ABILIFY) 2 MG tablet TAKE 1 TABLET BY MOUTH ONCE DAILY 30 tablet 0  . citalopram (CELEXA) 20 MG tablet TAKE 1 TABLET (20 MG TOTAL) BY MOUTH DAILY. 30 tablet 0  . HYDROcodone-acetaminophen (NORCO/VICODIN) 5-325 MG per tablet Take 1 tablet by mouth every 6 (six) hours as needed for moderate pain. 20 tablet 0  . ibuprofen (ADVIL,MOTRIN) 200 MG tablet Take 400 mg by mouth every 6 (six) hours as needed for mild pain or moderate pain.    Marland Kitchen levETIRAcetam (KEPPRA) 500 MG tablet Take 1 tablet (500 mg total) by mouth 2 (two) times daily. 60 tablet 0  . pantoprazole (PROTONIX) 40 MG tablet TAKE 1 TABLET (40 MG TOTAL) BY MOUTH DAILY. 30 tablet 11  . cyclobenzaprine (  FLEXERIL) 10 MG tablet Take 1 tablet (10 mg total) by mouth 2 (two) times daily as needed for muscle spasms. 20 tablet 0   No facility-administered medications prior to visit.    No Known Allergies  Review of Systems  Constitutional: Negative for fever and malaise/fatigue.  HENT: Negative for congestion.   Eyes: Negative for discharge.  Respiratory: Negative for shortness of breath.   Cardiovascular: Negative for chest pain, palpitations and leg swelling.  Gastrointestinal: Negative for nausea and abdominal pain.  Genitourinary: Positive for urgency. Negative for dysuria and hematuria.       Urinary incontinence intermittently  Musculoskeletal: Positive for myalgias, back pain and joint pain. Negative for falls.  Skin: Negative for rash.  Neurological: Negative for loss of consciousness and  headaches.  Endo/Heme/Allergies: Negative for environmental allergies.  Psychiatric/Behavioral: Negative for depression. The patient is nervous/anxious and has insomnia.        Objective:    Physical Exam  Constitutional: She is oriented to person, place, and time. She appears well-developed and well-nourished. No distress.  HENT:  Head: Normocephalic and atraumatic.  Nose: Nose normal.  Eyes: Right eye exhibits no discharge. Left eye exhibits no discharge.  Neck: Normal range of motion. Neck supple.  Cardiovascular: Normal rate and regular rhythm.   No murmur heard. Pulmonary/Chest: Effort normal and breath sounds normal.  Abdominal: Soft. Bowel sounds are normal. There is no tenderness.  Musculoskeletal: She exhibits tenderness. She exhibits no edema.  Low back pain worse with movement and palpationand pain down right leg  Neurological: She is alert and oriented to person, place, and time.  Skin: Skin is warm and dry.  Psychiatric: She has a normal mood and affect.  Patient crying, restless and agitated secondary to pain  Nursing note and vitals reviewed.   BP 142/80 mmHg  Pulse 87  Temp(Src) 98.1 F (36.7 C) (Oral)  Ht 5\' 2"  (1.575 m)  Wt 104 lb 6 oz (47.344 kg)  BMI 19.09 kg/m2  SpO2 97% Wt Readings from Last 3 Encounters:  12/07/14 104 lb 6 oz (47.344 kg)  11/19/14 99 lb (44.906 kg)  11/19/14 99 lb 8 oz (45.133 kg)     Lab Results  Component Value Date   WBC 9.9 11/19/2014   HGB 14.7 11/19/2014   HCT 43.2 11/19/2014   PLT 278 11/19/2014   GLUCOSE 107* 11/19/2014   ALT 18 11/19/2014   AST 28 11/19/2014   NA 137 11/19/2014   K 4.0 11/19/2014   CL 99* 11/19/2014   CREATININE 0.62 11/19/2014   BUN 9 11/19/2014   CO2 26 11/19/2014   TSH 0.797 02/24/2012   INR 1.0 11/19/2013    Lab Results  Component Value Date   TSH 0.797 02/24/2012   Lab Results  Component Value Date   WBC 9.9 11/19/2014   HGB 14.7 11/19/2014   HCT 43.2 11/19/2014   MCV 81.5  11/19/2014   PLT 278 11/19/2014   Lab Results  Component Value Date   NA 137 11/19/2014   K 4.0 11/19/2014   CO2 26 11/19/2014   GLUCOSE 107* 11/19/2014   BUN 9 11/19/2014   CREATININE 0.62 11/19/2014   BILITOT 0.4 11/19/2014   ALKPHOS 69 11/19/2014   AST 28 11/19/2014   ALT 18 11/19/2014   PROT 8.7* 11/19/2014   ALBUMIN 4.4 11/19/2014   CALCIUM 9.6 11/19/2014   ANIONGAP 12 11/19/2014   GFR 128.45 11/19/2013   No results found for: CHOL No results found for: HDL No  results found for: [place] No results found for: TRIG No results found for: CHOLHDL No results found for: HGBA1C     Assessment & Plan:   Problem List Items Addressed This Visit    Lumbar radiculopathy    Worsening pain. Given steroid shot in office and placed on Prednisone 50 mg daily x 5 days. Encouraged movement as tolerated and proceed with referral if does not improve. Ordered xray once complete can consider MRI      Relevant Medications   gabapentin (NEURONTIN) 600 MG tablet   mirtazapine (REMERON) 30 MG tablet   carisoprodol (SOMA) 350 MG tablet   HTN (hypertension)    Poorly controlled will not alter medications likely due to pain, encouraged DASH diet, minimize caffeine and obtain adequate sleep. Report concerning symptoms and follow up as directed and as needed       Other Visit Diagnoses    Midline low back pain with right-sided sciatica    -  Primary    Relevant Medications    predniSONE (DELTASONE) 5 MG tablet    carisoprodol (SOMA) 350 MG tablet    predniSONE (DELTASONE) 50 MG tablet    methylPREDNISolone acetate (DEPO-MEDROL) injection 40 mg (Completed)    Other Relevant Orders    DG Lumbar Spine Complete       I have discontinued Ms. Newnam's cyclobenzaprine. I am also having her start on carisoprodol and predniSONE. Additionally, I am having her maintain her ibuprofen, ARIPiprazole, citalopram, levETIRAcetam, pantoprazole, HYDROcodone-acetaminophen, gabapentin, mirtazapine, and  predniSONE. We administered methylPREDNISolone acetate.  Meds ordered this encounter  Medications  . gabapentin (NEURONTIN) 600 MG tablet    Sig: Take 600 mg by mouth 3 (three) times daily.  . mirtazapine (REMERON) 30 MG tablet    Sig: Take 30 mg by mouth 2 (two) times daily.  . predniSONE (DELTASONE) 5 MG tablet    Sig: Take 5 mg by mouth daily.  . carisoprodol (SOMA) 350 MG tablet    Sig: Take 1 tablet (350 mg total) by mouth 4 (four) times daily as needed for muscle spasms.    Dispense:  60 tablet    Refill:  0  . predniSONE (DELTASONE) 50 MG tablet    Sig: 1 tab po daily x 5 days    Dispense:  5 tablet    Refill:  0  . methylPREDNISolone acetate (DEPO-MEDROL) injection 40 mg    Sig:      [name], MD

## 2014-12-13 NOTE — Assessment & Plan Note (Signed)
Poorly controlled will not alter medications likely due to pain, encouraged DASH diet, minimize caffeine and obtain adequate sleep. Report concerning symptoms and follow up as directed and as needed

## 2014-12-15 NOTE — Telephone Encounter (Signed)
No do not charge no show since she has already showed since then

## 2014-12-15 NOTE — Telephone Encounter (Signed)
Pt was no show 12/02/14 10:15am, follow up appt, pt came in 12/07/14, charge for no show?

## 2014-12-24 ENCOUNTER — Ambulatory Visit (HOSPITAL_BASED_OUTPATIENT_CLINIC_OR_DEPARTMENT_OTHER)
Admission: RE | Admit: 2014-12-24 | Discharge: 2014-12-24 | Disposition: A | Discharge status: Home / Self Care | Payer: 59 | Source: Ambulatory Visit | Attending: Family Medicine | Admitting: Family Medicine

## 2014-12-24 DIAGNOSIS — M545 Low back pain: Secondary | ICD-10-CM | POA: Diagnosis not present

## 2014-12-24 DIAGNOSIS — M5441 Lumbago with sciatica, right side: Secondary | ICD-10-CM

## 2015-01-04 ENCOUNTER — Encounter: Payer: Self-pay | Admitting: Family Medicine

## 2015-01-04 ENCOUNTER — Ambulatory Visit (INDEPENDENT_AMBULATORY_CARE_PROVIDER_SITE_OTHER): Payer: 59 | Admitting: Family Medicine

## 2015-01-04 VITALS — BP 108/74 | HR 88 | Temp 98.7°F | Ht 62.0 in | Wt 102.5 lb

## 2015-01-04 DIAGNOSIS — M5416 Radiculopathy, lumbar region: Secondary | ICD-10-CM | POA: Diagnosis not present

## 2015-01-04 DIAGNOSIS — M5441 Lumbago with sciatica, right side: Secondary | ICD-10-CM

## 2015-01-04 DIAGNOSIS — R634 Abnormal weight loss: Secondary | ICD-10-CM | POA: Diagnosis not present

## 2015-01-04 DIAGNOSIS — I10 Essential (primary) hypertension: Secondary | ICD-10-CM

## 2015-01-04 DIAGNOSIS — R739 Hyperglycemia, unspecified: Secondary | ICD-10-CM

## 2015-01-04 MED ORDER — PREDNISONE 50 MG PO TABS
ORAL_TABLET | ORAL | Status: DC
Start: 2015-01-04 — End: 2015-02-21

## 2015-01-04 MED ORDER — CYCLOBENZAPRINE HCL 10 MG PO TABS: 10.0000 mg | ORAL_TABLET | Freq: Three times a day (TID) | ORAL | Status: DC | PRN

## 2015-01-04 NOTE — Progress Notes (Signed)
Pre visit review using our clinic review tool, if applicable. No additional management support is needed unless otherwise documented below in the visit note. 

## 2015-01-04 NOTE — Patient Instructions (Signed)

## 2015-01-15 ENCOUNTER — Encounter: Payer: Self-pay | Admitting: Family Medicine

## 2015-01-15 ENCOUNTER — Ambulatory Visit (HOSPITAL_BASED_OUTPATIENT_CLINIC_OR_DEPARTMENT_OTHER): Payer: 59

## 2015-01-15 DIAGNOSIS — R739 Hyperglycemia, unspecified: Secondary | ICD-10-CM | POA: Insufficient documentation

## 2015-01-15 HISTORY — DX: Hyperglycemia, unspecified: R73.9

## 2015-01-15 NOTE — Assessment & Plan Note (Signed)
Well controlled, no changes to meds. Encouraged heart healthy diet such as the DASH diet and exercise as tolerated.  °

## 2015-01-15 NOTE — Assessment & Plan Note (Addendum)
Encouraged moist heat and gentle stretching as tolerated. May try NSAIDs and prescription meds as directed and report if symptoms worsen or seek immediate care. Recent flare, given rx for Prednisone.

## 2015-01-15 NOTE — Assessment & Plan Note (Addendum)
minimize simple carbs. Increase exercise as tolerated.  

## 2015-01-15 NOTE — Progress Notes (Signed)
Subjective:    Patient ID: Ruth Bell, female    DOB: 09-06-1960, 54 y.o.   MRN: [id number]  Chief Complaint  Patient presents with  . Back Pain  . Leg Pain    HPI Patient is in today for follow-up. Is struggling with increased low back pain. Has chronic trouble with pain radiating down right leg with some numbness along the lateral aspect. Does note some incontinence of urine but that is ongoing and unchanged. No stool incontinence. No recent falls or injuries. No fevers or chills. Continues to struggle with stress and anxiety but no suicidal ideation. Follows with psychiatry. Denies CP/palp/SOB/HA/congestion/fevers/GI or GU c/o. Taking meds as prescribed  Past Medical History  Diagnosis Date  . Seizures ([place])     history of pseudoseizure disorder  . Migraine, unspecified, without mention of intractable migraine without mention of status migrainosus   . Anemia   . Menometrorrhagia 2006  . Uterine leiomyoma 2006  . Depression   . Hypertension   . Tubular adenoma of colon 05/2011  . Constipation 12/27/2013  . Arm skin lesion, left 09/05/2014  . Hyperglycemia 01/15/2015    Past Surgical History  Procedure Laterality Date  . Appendectomy    . Bilateral salpingoophorectomy  04/06/04  . Abdominal hysterectomy  04/06/04  . Rotator cuff repair      Family History  Problem Relation Age of Onset  . Heart disease Mother   . Heart attack Mother   . Cancer Father     colon  . Hypertension Father   . Cancer Sister     brain  . Cancer Brother   . Hypertension Sister   . Cancer Sister     unsure of type  . Diabetes Sister     type 2  . Heart attack Sister   . Hypertension Brother     Social History   Social History  . Marital Status: Married    Spouse Name: Gwyndolyn Saxon  . Number of Children: N/A  . Years of Education: N/A   Occupational History  . CMA    Social History Main Topics  . Smoking status: Current Every Day Smoker -- 0.33 packs/day for 40 years    Types:  Cigarettes    Start date: 11/17/2013  . Smokeless tobacco: Never Used     Comment: 3-4 cigarettes daily  . Alcohol Use: No  . Drug Use: No  . Sexual Activity: Not on file     Comment: lives with husband, no dietary restrictions.    Other Topics Concern  . Not on file   Social History Narrative    Outpatient Prescriptions Prior to Visit  Medication Sig Dispense Refill  . ARIPiprazole (ABILIFY) 2 MG tablet TAKE 1 TABLET BY MOUTH ONCE DAILY 30 tablet 0  . citalopram (CELEXA) 20 MG tablet TAKE 1 TABLET (20 MG TOTAL) BY MOUTH DAILY. 30 tablet 0  . gabapentin (NEURONTIN) 600 MG tablet Take 600 mg by mouth 3 (three) times daily.    Marland Kitchen ibuprofen (ADVIL,MOTRIN) 200 MG tablet Take 400 mg by mouth every 6 (six) hours as needed for mild pain or moderate pain.    Marland Kitchen levETIRAcetam (KEPPRA) 500 MG tablet Take 1 tablet (500 mg total) by mouth 2 (two) times daily. 60 tablet 0  . mirtazapine (REMERON) 30 MG tablet Take 30 mg by mouth 2 (two) times daily.    . pantoprazole (PROTONIX) 40 MG tablet TAKE 1 TABLET (40 MG TOTAL) BY MOUTH DAILY. 30 tablet 11  .  carisoprodol (SOMA) 350 MG tablet Take 1 tablet (350 mg total) by mouth 4 (four) times daily as needed for muscle spasms. 60 tablet 0  . HYDROcodone-acetaminophen (NORCO/VICODIN) 5-325 MG per tablet Take 1 tablet by mouth every 6 (six) hours as needed for moderate pain. 20 tablet 0  . predniSONE (DELTASONE) 5 MG tablet Take 5 mg by mouth daily.    . predniSONE (DELTASONE) 50 MG tablet 1 tab po daily x 5 days 5 tablet 0   No facility-administered medications prior to visit.    No Known Allergies  Review of Systems  Constitutional: Negative for fever and malaise/fatigue.  HENT: Negative for congestion.   Eyes: Negative for discharge.  Respiratory: Negative for shortness of breath.   Cardiovascular: Negative for chest pain, palpitations and leg swelling.  Gastrointestinal: Negative for nausea and abdominal pain.  Genitourinary: Negative for  dysuria.  Musculoskeletal: Positive for back pain and joint pain. Negative for falls.  Skin: Negative for rash.  Neurological: Positive for tingling. Negative for loss of consciousness and headaches.  Endo/Heme/Allergies: Negative for environmental allergies.  Psychiatric/Behavioral: Negative for depression. The patient is not nervous/anxious.        Objective:    Physical Exam  Constitutional: She is oriented to person, place, and time. She appears well-developed and well-nourished. No distress.  HENT:  Head: Normocephalic and atraumatic.  Nose: Nose normal.  Eyes: Right eye exhibits no discharge. Left eye exhibits no discharge.  Neck: Normal range of motion. Neck supple.  Cardiovascular: Normal rate and regular rhythm.   No murmur heard. Pulmonary/Chest: Effort normal and breath sounds normal.  Abdominal: Soft. Bowel sounds are normal. There is no tenderness.  Musculoskeletal: She exhibits no edema.  Neurological: She is alert and oriented to person, place, and time.  Skin: Skin is warm and dry.  Psychiatric: She has a normal mood and affect.  Nursing note and vitals reviewed.   BP 108/74 mmHg  Pulse 88  Temp(Src) 98.7 F (37.1 C) (Oral)  Ht 5\' 2"  (1.575 m)  Wt 102 lb 8 oz (46.494 kg)  BMI 18.74 kg/m2  SpO2 98% Wt Readings from Last 3 Encounters:  01/04/15 102 lb 8 oz (46.494 kg)  12/07/14 104 lb 6 oz (47.344 kg)  11/19/14 99 lb (44.906 kg)     Lab Results  Component Value Date   WBC 9.9 11/19/2014   HGB 14.7 11/19/2014   HCT 43.2 11/19/2014   PLT 278 11/19/2014   GLUCOSE 107* 11/19/2014   ALT 18 11/19/2014   AST 28 11/19/2014   NA 137 11/19/2014   K 4.0 11/19/2014   CL 99* 11/19/2014   CREATININE 0.62 11/19/2014   BUN 9 11/19/2014   CO2 26 11/19/2014   TSH 0.797 02/24/2012   INR 1.0 11/19/2013    Lab Results  Component Value Date   TSH 0.797 02/24/2012   Lab Results  Component Value Date   WBC 9.9 11/19/2014   HGB 14.7 11/19/2014   HCT 43.2  11/19/2014   MCV 81.5 11/19/2014   PLT 278 11/19/2014   Lab Results  Component Value Date   NA 137 11/19/2014   K 4.0 11/19/2014   CO2 26 11/19/2014   GLUCOSE 107* 11/19/2014   BUN 9 11/19/2014   CREATININE 0.62 11/19/2014   BILITOT 0.4 11/19/2014   ALKPHOS 69 11/19/2014   AST 28 11/19/2014   ALT 18 11/19/2014   PROT 8.7* 11/19/2014   ALBUMIN 4.4 11/19/2014   CALCIUM 9.6 11/19/2014   ANIONGAP 12  11/19/2014   GFR 128.45 11/19/2013   No results found for: CHOL No results found for: HDL No results found for: LDLCALC No results found for: TRIG No results found for: CHOLHDL No results found for: HGBA1C     Assessment & Plan:   Problem List Items Addressed This Visit    WEIGHT LOSS    Encouraged DASH diet, decrease po intake and increase exercise as tolerated. Needs 7-8 hours of sleep nightly. Avoid trans fats, eat small, frequent meals every 4-5 hours with lean proteins, complex carbs and healthy fats. Minimize simple carbs, GMO foods. Given rx for Saxenda.       Lumbar radiculopathy    Encouraged moist heat and gentle stretching as tolerated. May try NSAIDs and prescription meds as directed and report if symptoms worsen or seek immediate care. Recent flare, given rx for Prednisone.       Relevant Medications   cyclobenzaprine (FLEXERIL) 10 MG tablet   Hyperglycemia     minimize simple carbs. Increase exercise as tolerated.      HTN (hypertension)    Well controlled, no changes to meds. Encouraged heart healthy diet such as the DASH diet and exercise as tolerated.        Other Visit Diagnoses    Right-sided low back pain with right-sided sciatica    -  Primary    Relevant Medications    cyclobenzaprine (FLEXERIL) 10 MG tablet    predniSONE (DELTASONE) 50 MG tablet    Other Relevant Orders    MR Lumbar Spine Wo Contrast    Ambulatory referral to Neurosurgery    Midline low back pain with right-sided sciatica        Relevant Medications    cyclobenzaprine  (FLEXERIL) 10 MG tablet    predniSONE (DELTASONE) 50 MG tablet       I have discontinued Ms. Sailors's HYDROcodone-acetaminophen, carisoprodol, and cyclobenzaprine. I am also having her start on cyclobenzaprine. Additionally, I am having her maintain her ibuprofen, ARIPiprazole, citalopram, levETIRAcetam, pantoprazole, gabapentin, mirtazapine, and predniSONE.  Meds ordered this encounter  Medications  . DISCONTD: cyclobenzaprine (FLEXERIL) 10 MG tablet    Sig: Take 10 mg by mouth 2 (two) times daily.    Refill:  0  . cyclobenzaprine (FLEXERIL) 10 MG tablet    Sig: Take 1 tablet (10 mg total) by mouth 3 (three) times daily as needed for muscle spasms.    Dispense:  90 tablet    Refill:  1  . predniSONE (DELTASONE) 50 MG tablet    Sig: 1 tab po daily x 5 days    Dispense:  5 tablet    Refill:  0     [name], MD

## 2015-01-15 NOTE — Assessment & Plan Note (Signed)
Encouraged DASH diet, decrease po intake and increase exercise as tolerated. Needs 7-8 hours of sleep nightly. Avoid trans fats, eat small, frequent meals every 4-5 hours with lean proteins, complex carbs and healthy fats. Minimize simple carbs, GMO foods. Given rx for Saxenda.

## 2015-01-22 ENCOUNTER — Ambulatory Visit (HOSPITAL_BASED_OUTPATIENT_CLINIC_OR_DEPARTMENT_OTHER)
Admission: RE | Admit: 2015-01-22 | Discharge: 2015-01-22 | Disposition: A | Discharge status: Home / Self Care | Payer: 59 | Source: Ambulatory Visit | Attending: Family Medicine | Admitting: Family Medicine

## 2015-01-22 DIAGNOSIS — R531 Weakness: Secondary | ICD-10-CM | POA: Insufficient documentation

## 2015-01-22 DIAGNOSIS — R202 Paresthesia of skin: Secondary | ICD-10-CM | POA: Insufficient documentation

## 2015-01-22 DIAGNOSIS — R200 Anesthesia of skin: Secondary | ICD-10-CM | POA: Diagnosis not present

## 2015-01-22 DIAGNOSIS — M545 Low back pain: Secondary | ICD-10-CM | POA: Diagnosis present

## 2015-01-22 DIAGNOSIS — M79604 Pain in right leg: Secondary | ICD-10-CM | POA: Diagnosis not present

## 2015-01-22 DIAGNOSIS — M25551 Pain in right hip: Secondary | ICD-10-CM | POA: Insufficient documentation

## 2015-01-22 DIAGNOSIS — M5441 Lumbago with sciatica, right side: Secondary | ICD-10-CM

## 2015-02-15 ENCOUNTER — Ambulatory Visit: Payer: 59 | Admitting: Family Medicine

## 2015-02-15 ENCOUNTER — Telehealth: Payer: Self-pay | Admitting: Family Medicine

## 2015-02-21 ENCOUNTER — Other Ambulatory Visit (HOSPITAL_COMMUNITY)
Admission: RE | Admit: 2015-02-21 | Discharge: 2015-02-21 | Disposition: A | Discharge status: Home / Self Care | Payer: 59 | Source: Ambulatory Visit | Attending: Family Medicine | Admitting: Family Medicine

## 2015-02-21 ENCOUNTER — Encounter: Payer: Self-pay | Admitting: Family Medicine

## 2015-02-21 ENCOUNTER — Ambulatory Visit (INDEPENDENT_AMBULATORY_CARE_PROVIDER_SITE_OTHER): Payer: 59 | Admitting: Family Medicine

## 2015-02-21 VITALS — BP 142/88 | HR 72 | Temp 98.2°F | Ht 62.0 in | Wt 102.2 lb

## 2015-02-21 DIAGNOSIS — M5417 Radiculopathy, lumbosacral region: Secondary | ICD-10-CM

## 2015-02-21 DIAGNOSIS — Z113 Encounter for screening for infections with a predominantly sexual mode of transmission: Secondary | ICD-10-CM | POA: Diagnosis not present

## 2015-02-21 DIAGNOSIS — D649 Anemia, unspecified: Secondary | ICD-10-CM

## 2015-02-21 DIAGNOSIS — N760 Acute vaginitis: Secondary | ICD-10-CM | POA: Diagnosis present

## 2015-02-21 DIAGNOSIS — I10 Essential (primary) hypertension: Secondary | ICD-10-CM

## 2015-02-21 DIAGNOSIS — Z1239 Encounter for other screening for malignant neoplasm of breast: Secondary | ICD-10-CM

## 2015-02-21 DIAGNOSIS — M5416 Radiculopathy, lumbar region: Secondary | ICD-10-CM

## 2015-02-21 DIAGNOSIS — H547 Unspecified visual loss: Secondary | ICD-10-CM

## 2015-02-21 DIAGNOSIS — R739 Hyperglycemia, unspecified: Secondary | ICD-10-CM | POA: Diagnosis not present

## 2015-02-21 DIAGNOSIS — Z1159 Encounter for screening for other viral diseases: Secondary | ICD-10-CM

## 2015-02-21 DIAGNOSIS — R3129 Other microscopic hematuria: Secondary | ICD-10-CM

## 2015-02-21 DIAGNOSIS — M7918 Myalgia, other site: Secondary | ICD-10-CM

## 2015-02-21 DIAGNOSIS — M791 Myalgia: Secondary | ICD-10-CM

## 2015-02-21 DIAGNOSIS — R350 Frequency of micturition: Secondary | ICD-10-CM

## 2015-02-21 DIAGNOSIS — R634 Abnormal weight loss: Secondary | ICD-10-CM

## 2015-02-21 LAB — LIPID PANEL
Cholesterol: 214 mg/dL — ABNORMAL HIGH (ref 0–200)
HDL: 73 mg/dL (ref >39.00)
LDL Cholesterol: 132 mg/dL — ABNORMAL HIGH (ref 0–99)
NonHDL: 141.12
Total CHOL/HDL Ratio: 3
Triglycerides: 48 mg/dL (ref 0.0–149.0)
VLDL: 9.6 mg/dL (ref 0.0–40.0)

## 2015-02-21 LAB — COMPREHENSIVE METABOLIC PANEL
ALT: 11 U/L (ref 0–35)
AST: 14 U/L (ref 0–37)
Albumin: 4.2 g/dL (ref 3.5–5.2)
Alkaline Phosphatase: 57 U/L (ref 39–117)
BUN: 8 mg/dL (ref 6–23)
CO2: 24 mEq/L (ref 19–32)
Calcium: 9.3 mg/dL (ref 8.4–10.5)
Chloride: 106 mEq/L (ref 96–112)
Creatinine, Ser: 0.51 mg/dL (ref 0.40–1.20)
GFR: 160.19 mL/min (ref >60.00)
Glucose, Bld: 88 mg/dL (ref 70–99)
Potassium: 4 mEq/L (ref 3.5–5.1)
Sodium: 139 mEq/L (ref 135–145)
Total Bilirubin: 0.6 mg/dL (ref 0.2–1.2)
Total Protein: 7.4 g/dL (ref 6.0–8.3)

## 2015-02-21 LAB — CBC
HCT: 40.2 % (ref 36.0–46.0)
Hemoglobin: 12.9 g/dL (ref 12.0–15.0)
MCHC: 32 g/dL (ref 30.0–36.0)
MCV: 86 fl (ref 78.0–100.0)
Platelets: 269 10*3/uL (ref 150.0–400.0)
RBC: 4.67 Mil/uL (ref 3.87–5.11)
RDW: 15.9 % — ABNORMAL HIGH (ref 11.5–15.5)
WBC: 9.7 10*3/uL (ref 4.0–10.5)

## 2015-02-21 LAB — URINALYSIS, ROUTINE W REFLEX MICROSCOPIC
Bilirubin Urine: NEGATIVE
Ketones, ur: NEGATIVE
Leukocytes, UA: NEGATIVE
Nitrite: NEGATIVE
Specific Gravity, Urine: 1.015 (ref 1.000–1.030)
Total Protein, Urine: NEGATIVE
Urine Glucose: NEGATIVE
Urobilinogen, UA: 0.2 (ref 0.0–1.0)
pH: 6.5 (ref 5.0–8.0)

## 2015-02-21 LAB — HIV ANTIBODY (ROUTINE TESTING W REFLEX): HIV 1&2 Ab, 4th Generation: NONREACTIVE

## 2015-02-21 LAB — HEMOGLOBIN A1C: Hgb A1c MFr Bld: 5.8 % (ref 4.6–6.5)

## 2015-02-21 LAB — TSH: TSH: 0.66 u[IU]/mL (ref 0.35–4.50)

## 2015-02-21 MED ORDER — PANTOPRAZOLE SODIUM 40 MG PO TBEC: DELAYED_RELEASE_TABLET | ORAL | Status: DC

## 2015-02-21 MED ORDER — HYDROCODONE-ACETAMINOPHEN 5-325 MG PO TABS: 1.0000 | ORAL_TABLET | Freq: Four times a day (QID) | ORAL | Status: DC | PRN

## 2015-02-21 MED ORDER — CYCLOBENZAPRINE HCL 10 MG PO TABS: 10.0000 mg | ORAL_TABLET | Freq: Three times a day (TID) | ORAL | Status: DC | PRN

## 2015-02-21 NOTE — Patient Instructions (Signed)
Hypertension Hypertension, commonly called high blood pressure, is when the force of blood pumping through your arteries is too strong. Your arteries are the blood vessels that carry blood from your heart throughout your body. A blood pressure reading consists of a higher number over a lower number, such as 110/72. The higher number (systolic) is the pressure inside your arteries when your heart pumps. The lower number (diastolic) is the pressure inside your arteries when your heart relaxes. Ideally you want your blood pressure below 120/80. Hypertension forces your heart to work harder to pump blood. Your arteries may become narrow or stiff. Having untreated or uncontrolled hypertension can cause heart attack, stroke, kidney disease, and other problems. RISK FACTORS Some risk factors for high blood pressure are controllable. Others are not.  Risk factors you cannot control include:   Race. You may be at higher risk if you are African American.  Age. Risk increases with age.  Gender. Men are at higher risk than women before age 45 years. After age 65, women are at higher risk than men. Risk factors you can control include:  Not getting enough exercise or physical activity.  Being overweight.  Getting too much fat, sugar, calories, or salt in your diet.  Drinking too much alcohol. SIGNS AND SYMPTOMS Hypertension does not usually cause signs or symptoms. Extremely high blood pressure (hypertensive crisis) may cause headache, anxiety, shortness of breath, and nosebleed. DIAGNOSIS To check if you have hypertension, your health care provider will measure your blood pressure while you are seated, with your arm held at the level of your heart. It should be measured at least twice using the same arm. Certain conditions can cause a difference in blood pressure between your right and left arms. A blood pressure reading that is higher than normal on one occasion does not mean that you need treatment. If  it is not clear whether you have high blood pressure, you may be asked to return on a different day to have your blood pressure checked again. Or, you may be asked to monitor your blood pressure at home for 1 or more weeks. TREATMENT Treating high blood pressure includes making lifestyle changes and possibly taking medicine. Living a healthy lifestyle can help lower high blood pressure. You may need to change some of your habits. Lifestyle changes may include:  Following the DASH diet. This diet is high in fruits, vegetables, and whole grains. It is low in salt, red meat, and added sugars.  Keep your sodium intake below 2,300 mg per day.  Getting at least 30-45 minutes of aerobic exercise at least 4 times per week.  Losing weight if necessary.  Not smoking.  Limiting alcoholic beverages.  Learning ways to reduce stress. Your health care provider may prescribe medicine if lifestyle changes are not enough to get your blood pressure under control, and if one of the following is true:  You are 18-59 years of age and your systolic blood pressure is above 140.  You are 60 years of age or older, and your systolic blood pressure is above 150.  Your diastolic blood pressure is above 90.  You have diabetes, and your systolic blood pressure is over 140 or your diastolic blood pressure is over 90.  You have kidney disease and your blood pressure is above 140/90.  You have heart disease and your blood pressure is above 140/90. Your personal target blood pressure may vary depending on your medical conditions, your age, and other factors. HOME CARE INSTRUCTIONS    Have your blood pressure rechecked as directed by your health care provider.   Take medicines only as directed by your health care provider. Follow the directions carefully. Blood pressure medicines must be taken as prescribed. The medicine does not work as well when you skip doses. Skipping doses also puts you at risk for  problems.  Do not smoke.   Monitor your blood pressure at home as directed by your health care provider. SEEK MEDICAL CARE IF:   You think you are having a reaction to medicines taken.  You have recurrent headaches or feel dizzy.  You have swelling in your ankles.  You have trouble with your vision. SEEK IMMEDIATE MEDICAL CARE IF:  You develop a severe headache or confusion.  You have unusual weakness, numbness, or feel faint.  You have severe chest or abdominal pain.  You vomit repeatedly.  You have trouble breathing. MAKE SURE YOU:   Understand these instructions.  Will watch your condition.  Will get help right away if you are not doing well or get worse.   This information is not intended to replace advice given to you by your health care provider. Make sure you discuss any questions you have with your health care provider.   Document Released: 03/05/2005 Document Revised: 07/20/2014 Document Reviewed: 12/26/2012 Elsevier Interactive Patient Education 2016 [employer]  

## 2015-02-21 NOTE — Progress Notes (Signed)
Pre visit review using our clinic review tool, if applicable. No additional management support is needed unless otherwise documented below in the visit note. 

## 2015-02-22 LAB — HEPATITIS C ANTIBODY: HCV Ab: NEGATIVE

## 2015-02-22 LAB — URINE CYTOLOGY ANCILLARY ONLY
Chlamydia: NEGATIVE
Neisseria Gonorrhea: NEGATIVE
Trichomonas: NEGATIVE

## 2015-02-22 LAB — URINE CULTURE
Colony Count: NO GROWTH
Organism ID, Bacteria: NO GROWTH

## 2015-02-23 NOTE — Telephone Encounter (Signed)
Pt was no show 02/15/15 6:00pm for follow up, pt came in 02/21/15, 2nd no show, charge or no charge?

## 2015-02-23 NOTE — Telephone Encounter (Signed)
No chargerrr

## 2015-02-24 ENCOUNTER — Ambulatory Visit (HOSPITAL_BASED_OUTPATIENT_CLINIC_OR_DEPARTMENT_OTHER): Payer: 59

## 2015-02-24 LAB — URINE CYTOLOGY ANCILLARY ONLY
Bacterial vaginitis: NEGATIVE
Candida vaginitis: NEGATIVE

## 2015-02-26 NOTE — Assessment & Plan Note (Signed)
stabilixed with increased proteins

## 2015-02-26 NOTE — Assessment & Plan Note (Signed)
Well controlled, no changes to meds. Encouraged heart healthy diet such as the DASH diet and exercise as tolerated.  °

## 2015-02-26 NOTE — Assessment & Plan Note (Signed)
hgba1c acceptable, minimize simple carbs. Increase exercise as tolerated. Continue current meds 

## 2015-02-26 NOTE — Progress Notes (Signed)
Subjective:    Patient ID: Ruth Bell, female    DOB: December 21, 1960, 54 y.o.   MRN: [id number]  Chief Complaint  Patient presents with  . Follow-up    HPI Patient is in today for follow-up. She continues to struggle with pain. Most notably in the low back but she does ultimately have pain throughout the back some radicular symptoms into the legs at times. No incontinence. No recent illness or fevers. Denies CP/palp/SOB/HA/congestion/fevers/GI or GU c/o. Taking meds as prescribed  Past Medical History  Diagnosis Date  . Seizures ([place])     history of pseudoseizure disorder  . Migraine, unspecified, without mention of intractable migraine without mention of status migrainosus   . Anemia   . Menometrorrhagia 2006  . Uterine leiomyoma 2006  . Depression   . Hypertension   . Tubular adenoma of colon 05/2011  . Constipation 12/27/2013  . Arm skin lesion, left 09/05/2014  . Hyperglycemia 01/15/2015    Past Surgical History  Procedure Laterality Date  . Appendectomy    . Bilateral salpingoophorectomy  04/06/04  . Abdominal hysterectomy  04/06/04  . Rotator cuff repair      Family History  Problem Relation Age of Onset  . Heart disease Mother   . Heart attack Mother   . Cancer Father     colon  . Hypertension Father   . Cancer Sister     brain  . Cancer Brother   . Hypertension Sister   . Cancer Sister     unsure of type  . Diabetes Sister     type 2  . Heart attack Sister   . Hypertension Brother     Social History   Social History  . Marital Status: Married    Spouse Name: Gwyndolyn Saxon  . Number of Children: N/A  . Years of Education: N/A   Occupational History  . CMA    Social History Main Topics  . Smoking status: Current Every Day Smoker -- 0.33 packs/day for 40 years    Types: Cigarettes    Start date: 11/17/2013  . Smokeless tobacco: Never Used     Comment: 3-4 cigarettes daily  . Alcohol Use: No  . Drug Use: No  . Sexual Activity: Not on file   Comment: lives with husband, no dietary restrictions.    Other Topics Concern  . Not on file   Social History Narrative    Outpatient Prescriptions Prior to Visit  Medication Sig Dispense Refill  . citalopram (CELEXA) 20 MG tablet TAKE 1 TABLET (20 MG TOTAL) BY MOUTH DAILY. 30 tablet 0  . gabapentin (NEURONTIN) 600 MG tablet Take 600 mg by mouth 3 (three) times daily.    Marland Kitchen ibuprofen (ADVIL,MOTRIN) 200 MG tablet Take 400 mg by mouth every 6 (six) hours as needed for mild pain or moderate pain.    Marland Kitchen levETIRAcetam (KEPPRA) 500 MG tablet Take 1 tablet (500 mg total) by mouth 2 (two) times daily. 60 tablet 0  . cyclobenzaprine (FLEXERIL) 10 MG tablet Take 1 tablet (10 mg total) by mouth 3 (three) times daily as needed for muscle spasms. 90 tablet 1  . pantoprazole (PROTONIX) 40 MG tablet TAKE 1 TABLET (40 MG TOTAL) BY MOUTH DAILY. 30 tablet 11  . ARIPiprazole (ABILIFY) 2 MG tablet TAKE 1 TABLET BY MOUTH ONCE DAILY 30 tablet 0  . mirtazapine (REMERON) 30 MG tablet Take 30 mg by mouth 2 (two) times daily.    . predniSONE (DELTASONE) 50 MG tablet 1  tab po daily x 5 days 5 tablet 0   No facility-administered medications prior to visit.    No Known Allergies  Review of Systems  Constitutional: Negative for fever and malaise/fatigue.  HENT: Negative for congestion.   Eyes: Negative for discharge.  Respiratory: Negative for shortness of breath.   Cardiovascular: Negative for chest pain, palpitations and leg swelling.  Gastrointestinal: Negative for nausea and abdominal pain.  Genitourinary: Negative for dysuria.  Musculoskeletal: Negative for falls.  Skin: Negative for rash.  Neurological: Negative for loss of consciousness and headaches.  Endo/Heme/Allergies: Negative for environmental allergies.  Psychiatric/Behavioral: Negative for depression. The patient is not nervous/anxious.        Objective:    Physical Exam  Constitutional: She is oriented to person, place, and time. She  appears well-developed and well-nourished. No distress.  HENT:  Head: Normocephalic and atraumatic.  Nose: Nose normal.  Eyes: Right eye exhibits no discharge. Left eye exhibits no discharge.  Neck: Normal range of motion. Neck supple.  Cardiovascular: Normal rate and regular rhythm.   No murmur heard. Pulmonary/Chest: Effort normal and breath sounds normal.  Abdominal: Soft. Bowel sounds are normal. There is no tenderness.  Musculoskeletal: She exhibits no edema.  Neurological: She is alert and oriented to person, place, and time.  Skin: Skin is warm and dry.  Psychiatric: She has a normal mood and affect.  Nursing note and vitals reviewed.   BP 142/88 mmHg  Pulse 72  Temp(Src) 98.2 F (36.8 C) (Oral)  Ht 5\' 2"  (1.575 m)  Wt 102 lb 4 oz (46.38 kg)  BMI 18.70 kg/m2  SpO2 98% Wt Readings from Last 3 Encounters:  02/21/15 102 lb 4 oz (46.38 kg)  01/04/15 102 lb 8 oz (46.494 kg)  12/07/14 104 lb 6 oz (47.344 kg)     Lab Results  Component Value Date   WBC 9.7 02/21/2015   HGB 12.9 02/21/2015   HCT 40.2 02/21/2015   PLT 269.0 02/21/2015   GLUCOSE 88 02/21/2015   CHOL 214* 02/21/2015   TRIG 48.0 02/21/2015   HDL 73.00 02/21/2015   LDLCALC 132* 02/21/2015   ALT 11 02/21/2015   AST 14 02/21/2015   NA 139 02/21/2015   K 4.0 02/21/2015   CL 106 02/21/2015   CREATININE 0.51 02/21/2015   BUN 8 02/21/2015   CO2 24 02/21/2015   TSH 0.66 02/21/2015   INR 1.0 11/19/2013   HGBA1C 5.8 02/21/2015    Lab Results  Component Value Date   TSH 0.66 02/21/2015   Lab Results  Component Value Date   WBC 9.7 02/21/2015   HGB 12.9 02/21/2015   HCT 40.2 02/21/2015   MCV 86.0 02/21/2015   PLT 269.0 02/21/2015   Lab Results  Component Value Date   NA 139 02/21/2015   K 4.0 02/21/2015   CO2 24 02/21/2015   GLUCOSE 88 02/21/2015   BUN 8 02/21/2015   CREATININE 0.51 02/21/2015   BILITOT 0.6 02/21/2015   ALKPHOS 57 02/21/2015   AST 14 02/21/2015   ALT 11 02/21/2015   PROT  7.4 02/21/2015   ALBUMIN 4.2 02/21/2015   CALCIUM 9.3 02/21/2015   ANIONGAP 12 11/19/2014   GFR 160.19 02/21/2015   Lab Results  Component Value Date   CHOL 214* 02/21/2015   Lab Results  Component Value Date   HDL 73.00 02/21/2015   Lab Results  Component Value Date   LDLCALC 132* 02/21/2015   Lab Results  Component Value Date   TRIG  48.0 02/21/2015   Lab Results  Component Value Date   CHOLHDL 3 02/21/2015   Lab Results  Component Value Date   HGBA1C 5.8 02/21/2015       Assessment & Plan:   Problem List Items Addressed This Visit    ANEMIA   HTN (hypertension)    Well controlled, no changes to meds. Encouraged heart healthy diet such as the DASH diet and exercise as tolerated.       Relevant Medications   metoprolol tartrate (LOPRESSOR) 25 MG tablet   Other Relevant Orders   TSH (Completed)   CBC (Completed)   Lipid panel (Completed)   Comprehensive metabolic panel (Completed)   Hyperglycemia    hgba1c acceptable, minimize simple carbs. Increase exercise as tolerated. Continue current meds      Relevant Orders   Hemoglobin A1c (Completed)   Urinalysis   Urine cytology ancillary only (Completed)   Ambulatory referral to Ophthalmology   MICROSCOPIC HEMATURIA   Relevant Orders   Urinalysis   Urine culture (Completed)   Musculoskeletal pain    patient with persistent low back pain and some radicular symptoms. Referred back to ortho for further consideration. May continue current meds for  Now. Encouraged moist heat and gentle stretching as tolerated. May try NSAIDs and prescription meds as directed and report if symptoms worsen or seek immediate care      WEIGHT LOSS    stabilixed with increased proteins       Other Visit Diagnoses    Lumbar back pain with radiculopathy affecting right lower extremity    -  Primary    Relevant Medications    HYDROcodone-acetaminophen (NORCO/VICODIN) 5-325 MG tablet    cyclobenzaprine (FLEXERIL) 10 MG tablet     Other Relevant Orders    Ambulatory referral to Orthopedic Surgery    Urine cytology ancillary only (Completed)    Breast cancer screening        Relevant Orders    MM Digital Screening    Screening for viral disease        Relevant Orders    Hepatitis C antibody (Completed)    HIV antibody (with reflex) (Completed)    Urinary frequency        Decreased visual acuity        Relevant Orders    Ambulatory referral to Ophthalmology    Loss of vision        Relevant Orders    Ambulatory referral to Ophthalmology       I have discontinued Ms. Licea's ARIPiprazole, mirtazapine, predniSONE, and carisoprodol. I am also having her maintain her ibuprofen, citalopram, levETIRAcetam, gabapentin, metoprolol tartrate, HYDROcodone-acetaminophen, cyclobenzaprine, and pantoprazole.  Meds ordered this encounter  Medications  . metoprolol tartrate (LOPRESSOR) 25 MG tablet    Sig: Take 25 mg by mouth 2 (two) times daily.  Marland Kitchen DISCONTD: carisoprodol (SOMA) 350 MG tablet    Sig: Take 350 mg by mouth 4 (four) times daily.  Marland Kitchen DISCONTD: HYDROcodone-acetaminophen (NORCO/VICODIN) 5-325 MG tablet    Sig: Take 1 tablet by mouth every 6 (six) hours as needed for moderate pain.  Marland Kitchen HYDROcodone-acetaminophen (NORCO/VICODIN) 5-325 MG tablet    Sig: Take 1 tablet by mouth every 6 (six) hours as needed for moderate pain.    Dispense:  60 tablet    Refill:  0  . cyclobenzaprine (FLEXERIL) 10 MG tablet    Sig: Take 1 tablet (10 mg total) by mouth 3 (three) times daily as needed for muscle spasms.  Dispense:  90 tablet    Refill:  1  . pantoprazole (PROTONIX) 40 MG tablet    Sig: TAKE 1 TABLET (40 MG TOTAL) BY MOUTH DAILY.    Dispense:  30 tablet    Refill:  11     [name], MD

## 2015-02-26 NOTE — Assessment & Plan Note (Signed)
patient with persistent low back pain and some radicular symptoms. Referred back to ortho for further consideration. May continue current meds for  Now. Encouraged moist heat and gentle stretching as tolerated. May try NSAIDs and prescription meds as directed and report if symptoms worsen or seek immediate care

## 2015-03-01 ENCOUNTER — Telehealth: Payer: Self-pay | Admitting: Family Medicine

## 2015-03-01 ENCOUNTER — Encounter: Payer: Self-pay | Admitting: Family Medicine

## 2015-03-01 NOTE — Telephone Encounter (Signed)
Called the patient informed PCP received her UDS results and due to testing positive for Demerol and Marijuana that Dr. Charlett Blake would no longer prescribe Cyclobenzaprine and Hydrocodone for her. Put this information in the FYI of her chart as well sent the patient a letter to inform as well.   The patient was informed no more refills on  Hydrocodone and cyclobenzaprine, the patient did verbalize an understanding of this result.

## 2015-03-04 ENCOUNTER — Telehealth: Payer: Self-pay | Admitting: Family Medicine

## 2015-03-04 NOTE — Telephone Encounter (Signed)
°  Relation to [id number] Call back number:[phone] Pharmacy:  Reason for call: pt states she was seen at [facility] on Wednesday, and they did a MRI and it shows a cyst the size of a softball on her ovaries, they informed her to contact dr. Charlett Blake asap to find out what needs to be done next.

## 2015-03-04 NOTE — Telephone Encounter (Signed)
Patient contacted of PCP instructions.  She would like a referral to a urologist.

## 2015-03-04 NOTE — Telephone Encounter (Signed)
No ovary cyst, just a kidney cyst. Can refer to urology for consideration if she agrees

## 2015-03-05 ENCOUNTER — Other Ambulatory Visit: Payer: Self-pay | Admitting: Family Medicine

## 2015-03-05 DIAGNOSIS — N281 Cyst of kidney, acquired: Secondary | ICD-10-CM

## 2015-03-07 ENCOUNTER — Encounter: Payer: Self-pay | Admitting: Family Medicine

## 2015-04-08 ENCOUNTER — Telehealth: Payer: Self-pay | Admitting: Family Medicine

## 2015-04-08 DIAGNOSIS — H53121 Transient visual loss, right eye: Secondary | ICD-10-CM

## 2015-04-08 NOTE — Telephone Encounter (Signed)
Called the patient left message with her husband to call me back.

## 2015-04-08 NOTE — Telephone Encounter (Signed)
Returned call from Dr Posey Pronto at [facility] regarding patient. Patient c/o intermittent episodes of right eye complete loss of vision for 1-2 days. Then improves. Has frequent headaches on right, not always associated with right eye visual losses. Reports worsening symptoms of HA over past 2 months. Vision worse over past 4-5 months. History of right eye orbital fracture in 2000 past.   Will call patient and have her come in for CBC with diff, sed rate and CRP. Also will order carotid dopplers to assess blood flow and she has follow up appt in 2 weeks opthamology. She should schedule an appt here if headaches escalate.

## 2015-04-11 NOTE — Telephone Encounter (Signed)
Patient informed and scheduled lab appointment and put order in for labs.

## 2015-04-11 NOTE — Addendum Note (Signed)
Addended by: Sharon Seller B on: 04/11/2015 11:41 AM   Modules accepted: Orders

## 2015-04-12 ENCOUNTER — Other Ambulatory Visit (INDEPENDENT_AMBULATORY_CARE_PROVIDER_SITE_OTHER): Payer: 59

## 2015-04-12 DIAGNOSIS — H53121 Transient visual loss, right eye: Secondary | ICD-10-CM | POA: Diagnosis not present

## 2015-04-12 LAB — CBC WITH DIFFERENTIAL/PLATELET
Basophils Absolute: 0 10*3/uL (ref 0.0–0.1)
Basophils Relative: 0 % (ref 0–1)
Eosinophils Absolute: 0.3 10*3/uL (ref 0.0–0.7)
Eosinophils Relative: 2 % (ref 0–5)
HCT: 41.7 % (ref 36.0–46.0)
Hemoglobin: 13.8 g/dL (ref 12.0–15.0)
Lymphocytes Relative: 25 % (ref 12–46)
Lymphs Abs: 3.2 10*3/uL (ref 0.7–4.0)
MCH: 28.4 pg (ref 26.0–34.0)
MCHC: 33.1 g/dL (ref 30.0–36.0)
MCV: 85.8 fL (ref 78.0–100.0)
MPV: 9.4 fL (ref 8.6–12.4)
Monocytes Absolute: 0.4 10*3/uL (ref 0.1–1.0)
Monocytes Relative: 3 % (ref 3–12)
Neutro Abs: 9 10*3/uL — ABNORMAL HIGH (ref 1.7–7.7)
Neutrophils Relative %: 70 % (ref 43–77)
Platelets: 306 10*3/uL (ref 150–400)
RBC: 4.86 MIL/uL (ref 3.87–5.11)
RDW: 14.1 % (ref 11.5–15.5)
WBC: 12.9 10*3/uL — ABNORMAL HIGH (ref 4.0–10.5)

## 2015-04-12 LAB — C-REACTIVE PROTEIN: CRP: <0.5 mg/dL (ref <0.60)

## 2015-04-13 LAB — SEDIMENTATION RATE: Sed Rate: 5 mm/hr (ref 0–30)

## 2015-04-14 ENCOUNTER — Telehealth: Payer: Self-pay | Admitting: Family Medicine

## 2015-04-14 NOTE — Telephone Encounter (Signed)
Caller name: Self  Can be reached: [phone]   Reason for call: Patient is requesting that her test results be faxed to Dr Posey Pronto @ [phone]. Dr Charlett Blake gave referral to patient and they now need all of the results from this office, (labs, imaging). Patient states that she has not had the imaging that was ordered

## 2015-04-15 ENCOUNTER — Ambulatory Visit (HOSPITAL_BASED_OUTPATIENT_CLINIC_OR_DEPARTMENT_OTHER)
Admission: RE | Admit: 2015-04-15 | Discharge: 2015-04-15 | Disposition: A | Discharge status: Home / Self Care | Payer: 59 | Source: Ambulatory Visit | Attending: Family Medicine | Admitting: Family Medicine

## 2015-04-15 DIAGNOSIS — I779 Disorder of arteries and arterioles, unspecified: Secondary | ICD-10-CM | POA: Diagnosis not present

## 2015-04-15 DIAGNOSIS — H53121 Transient visual loss, right eye: Secondary | ICD-10-CM | POA: Insufficient documentation

## 2015-04-15 NOTE — Telephone Encounter (Signed)
Faxed results to Dr. Posey Pronto.  Spoke to Marj to scheduled test PCP ordered today. Called the patient to inform all has been taken care of.

## 2015-05-11 ENCOUNTER — Other Ambulatory Visit: Payer: Self-pay | Admitting: Family

## 2015-05-26 ENCOUNTER — Ambulatory Visit: Payer: 59 | Admitting: Family Medicine

## 2015-05-26 ENCOUNTER — Ambulatory Visit (INDEPENDENT_AMBULATORY_CARE_PROVIDER_SITE_OTHER): Payer: 59 | Admitting: Family Medicine

## 2015-05-26 VITALS — BP 108/70 | HR 96 | Temp 98.0°F | Ht 62.0 in | Wt 104.8 lb

## 2015-05-26 DIAGNOSIS — F418 Other specified anxiety disorders: Secondary | ICD-10-CM

## 2015-05-26 DIAGNOSIS — K649 Unspecified hemorrhoids: Secondary | ICD-10-CM | POA: Diagnosis not present

## 2015-05-26 DIAGNOSIS — K5900 Constipation, unspecified: Secondary | ICD-10-CM | POA: Diagnosis not present

## 2015-05-26 DIAGNOSIS — D649 Anemia, unspecified: Secondary | ICD-10-CM

## 2015-05-26 DIAGNOSIS — Z86010 Personal history of colonic polyps: Secondary | ICD-10-CM

## 2015-05-26 DIAGNOSIS — R739 Hyperglycemia, unspecified: Secondary | ICD-10-CM

## 2015-05-26 DIAGNOSIS — I10 Essential (primary) hypertension: Secondary | ICD-10-CM | POA: Diagnosis not present

## 2015-05-26 DIAGNOSIS — Z1239 Encounter for other screening for malignant neoplasm of breast: Secondary | ICD-10-CM

## 2015-05-26 MED ORDER — GABAPENTIN 600 MG PO TABS: 600.0000 mg | ORAL_TABLET | Freq: Three times a day (TID) | ORAL | Status: DC

## 2015-05-26 MED ORDER — CITALOPRAM HYDROBROMIDE 20 MG PO TABS: ORAL_TABLET | ORAL | Status: DC

## 2015-05-26 MED ORDER — METOPROLOL TARTRATE 25 MG PO TABS: 25.0000 mg | ORAL_TABLET | Freq: Two times a day (BID) | ORAL | Status: DC

## 2015-05-26 MED ORDER — ALPRAZOLAM 0.5 MG PO TABS: 0.5000 mg | ORAL_TABLET | Freq: Two times a day (BID) | ORAL | Status: DC | PRN

## 2015-05-26 MED ORDER — PANTOPRAZOLE SODIUM 40 MG PO TBEC: DELAYED_RELEASE_TABLET | ORAL | Status: DC

## 2015-05-26 NOTE — Patient Instructions (Signed)
Tobacco Use Disorder Tobacco use disorder (TUD) is a mental disorder. It is the long-term use of tobacco in spite of related health problems or difficulty with normal life activities. Tobacco is most commonly smoked as cigarettes and less commonly as cigars or pipes. Smokeless chewing tobacco and snuff are also popular. People with TUD get a feeling of extreme pleasure (euphoria) from using tobacco and have a desire to use it again and again. Repeated use of tobacco can cause problems. The addictive effects of tobacco are due mainly tothe ingredient nicotine. Nicotine also causes a rush of adrenaline (epinephrine) in the body. This leads to increased blood pressure, heart rate, and breathing rate. These changes may cause problems for people with high blood pressure, weak hearts, or lung disease. High doses of nicotine in children and pets can lead to seizures and death.  Tobacco contains a number of other unsafe chemicals. These chemicals are especially harmful when inhaled as smoke and can damage almost every organ in the body. Smokers live shorter lives than nonsmokers and are at risk of dying from a number of diseases and cancers. Tobacco smoke can also cause health problems for nonsmokers (due to inhaling secondhand smoke). Smoking is also a fire hazard.  TUD usually starts in the late teenage years and is most common in young adults between the ages of 18 and 25 years. People who start smoking earlier in life are more likely to continue smoking as adults. TUD is somewhat more common in men than women. People with TUD are at higher risk for using alcohol and other drugs of abuse. RISK FACTORS Risk factors for TUD include:   Having family members with the disorder.  Being around people who use tobacco.  Having an existing mental health issue such as schizophrenia, depression, bipolar disorder, ADHD, or posttraumatic stress disorder (PTSD). SIGNS AND SYMPTOMS  People with tobacco use disorder have  two or more of the following signs and symptoms within 12 months:   Use of more tobacco over a longer period than intended.   Not able to cut down or control tobacco use.   A lot of time spent obtaining or using tobacco.   Strong desire or urge to use tobacco (craving). Cravings may last for 6 months or longer after quitting.  Use of tobacco even when use leads to major problems at work, school, or home.   Use of tobacco even when use leads to relationship problems.   Giving up or cutting down on important life activities because of tobacco use.   Repeatedly using tobacco in situations where it puts you or others in physical danger, like smoking in bed.   Use of tobacco even when it is known that a physical or mental problem is likely related to tobacco use.   Physical problems are numerous and may include chronic bronchitis, emphysema, lung and other cancers, gum disease, high blood pressure, heart disease, and stroke.   Mental problems caused by tobacco may include difficulty sleeping and anxiety.  Need to use greater amounts of tobacco to get the same effect. This means you have developed a tolerance.   Withdrawal symptoms as a result of stopping or rapidly cutting back use. These symptoms may last a month or more after quitting and include the following:   Depressed, anxious, or irritable mood.   Difficulty concentrating.   Increased appetite.  Restlessness or trouble sleeping.   Use of tobacco to avoid withdrawal symptoms. DIAGNOSIS  Tobacco use disorder is diagnosed by   your health care provider. A diagnosis may be made by:  Your health care provider asking questions about your tobacco use and any problems it may be causing.  A physical exam.  Lab tests.  You may be referred to a mental health professional or addiction specialist. The severity of tobacco use disorder depends on the number of signs and symptoms you have:   Mild--Two or three  symptoms.  Moderate--Four or five symptoms.   Severe--Six or more symptoms.  TREATMENT  Many people with tobacco use disorder are unable to quit on their own and need help. Treatment options include the following:  Nicotine replacement therapy (NRT). NRT provides nicotine without the other harmful chemicals in tobacco. NRT gradually lowers the dosage of nicotine in the body and reduces withdrawal symptoms. NRT is available in over-the-counter forms (gum, lozenges, and skin patches) as well as prescription forms (mouth inhaler and nasal spray).  Medicines.This may include:  Antidepressant medicine that may reduce nicotine cravings.  A medicine that acts on nicotine receptors in the brain to reduce cravings and withdrawal symptoms. It may also block the effects of tobacco in people with TUD who relapse.  Counseling or talk therapy. A form of talk therapy called behavioral therapy is commonly used to treat people with TUD. Behavioral therapy looks at triggers for tobacco use, how to avoid them, and how to cope with cravings. It is most effective in person or by phone but is also available in self-help forms (books and Internet websites).  Support groups. These provide emotional support, advice, and guidance for quitting tobacco. The most effective treatment for TUD is usually a combination of medicine, talk therapy, and support groups. HOME CARE INSTRUCTIONS  Keep all follow-up visits as directed by your health care provider. This is important.  Take medicines only as directed by your health care provider.  Check with your health care provider before starting new prescription or over-the-counter medicines. SEEK MEDICAL CARE IF:  You are not able to take your medicines as prescribed.  Treatment is not helping your TUD and your symptoms get worse. SEEK IMMEDIATE MEDICAL CARE IF:  You have serious thoughts about hurting yourself or others.  You have trouble breathing, chest pain,  sudden weakness, or sudden numbness in part of your body.   This information is not intended to replace advice given to you by your health care provider. Make sure you discuss any questions you have with your health care provider.   Document Released: 11/09/2003 Document Revised: 03/26/2014 Document Reviewed: 05/01/2013 Elsevier Interactive Patient Education 2016 [employer] Inc[employer]  

## 2015-05-26 NOTE — Progress Notes (Signed)
Pre visit review using our clinic review tool, if applicable. No additional management support is needed unless otherwise documented below in the visit note. 

## 2015-05-27 LAB — LIPID PANEL
Cholesterol: 214 mg/dL — ABNORMAL HIGH (ref 0–200)
HDL: 59.5 mg/dL (ref >39.00)
LDL Cholesterol: 134 mg/dL — ABNORMAL HIGH (ref 0–99)
NonHDL: 154.94
Total CHOL/HDL Ratio: 4
Triglycerides: 104 mg/dL (ref 0.0–149.0)
VLDL: 20.8 mg/dL (ref 0.0–40.0)

## 2015-05-27 LAB — COMPREHENSIVE METABOLIC PANEL
ALT: 13 U/L (ref 0–35)
AST: 16 U/L (ref 0–37)
Albumin: 4.4 g/dL (ref 3.5–5.2)
Alkaline Phosphatase: 54 U/L (ref 39–117)
BUN: 8 mg/dL (ref 6–23)
CO2: 29 mEq/L (ref 19–32)
Calcium: 9.3 mg/dL (ref 8.4–10.5)
Chloride: 104 mEq/L (ref 96–112)
Creatinine, Ser: 0.58 mg/dL (ref 0.40–1.20)
GFR: 137.96 mL/min (ref >60.00)
Glucose, Bld: 88 mg/dL (ref 70–99)
Potassium: 3.3 mEq/L — ABNORMAL LOW (ref 3.5–5.1)
Sodium: 143 mEq/L (ref 135–145)
Total Bilirubin: 0.3 mg/dL (ref 0.2–1.2)
Total Protein: 7.4 g/dL (ref 6.0–8.3)

## 2015-05-27 LAB — CBC
HCT: 38.3 % (ref 36.0–46.0)
Hemoglobin: 12.5 g/dL (ref 12.0–15.0)
MCHC: 32.6 g/dL (ref 30.0–36.0)
MCV: 84.4 fl (ref 78.0–100.0)
Platelets: 289 10*3/uL (ref 150.0–400.0)
RBC: 4.54 Mil/uL (ref 3.87–5.11)
RDW: 14.1 % (ref 11.5–15.5)
WBC: 6.6 10*3/uL (ref 4.0–10.5)

## 2015-05-27 LAB — TSH: TSH: 1.02 u[IU]/mL (ref 0.35–4.50)

## 2015-05-27 LAB — HEMOGLOBIN A1C: Hgb A1c MFr Bld: 6.6 % — ABNORMAL HIGH (ref 4.6–6.5)

## 2015-05-30 ENCOUNTER — Encounter (HOSPITAL_BASED_OUTPATIENT_CLINIC_OR_DEPARTMENT_OTHER): Payer: Self-pay | Admitting: *Deleted

## 2015-05-30 ENCOUNTER — Ambulatory Visit (HOSPITAL_BASED_OUTPATIENT_CLINIC_OR_DEPARTMENT_OTHER)
Admission: RE | Admit: 2015-05-30 | Discharge: 2015-05-30 | Disposition: A | Discharge status: Home / Self Care | Payer: 59 | Source: Ambulatory Visit | Attending: Family Medicine | Admitting: Family Medicine

## 2015-05-30 ENCOUNTER — Other Ambulatory Visit: Payer: Self-pay | Admitting: Family Medicine

## 2015-05-30 DIAGNOSIS — R928 Other abnormal and inconclusive findings on diagnostic imaging of breast: Secondary | ICD-10-CM | POA: Diagnosis not present

## 2015-05-30 DIAGNOSIS — Z1231 Encounter for screening mammogram for malignant neoplasm of breast: Secondary | ICD-10-CM | POA: Insufficient documentation

## 2015-05-30 DIAGNOSIS — E876 Hypokalemia: Secondary | ICD-10-CM

## 2015-05-30 DIAGNOSIS — Z86010 Personal history of colonic polyps: Secondary | ICD-10-CM | POA: Diagnosis not present

## 2015-05-30 DIAGNOSIS — I10 Essential (primary) hypertension: Secondary | ICD-10-CM | POA: Diagnosis not present

## 2015-05-30 DIAGNOSIS — Z1239 Encounter for other screening for malignant neoplasm of breast: Secondary | ICD-10-CM

## 2015-05-30 DIAGNOSIS — K5900 Constipation, unspecified: Secondary | ICD-10-CM | POA: Insufficient documentation

## 2015-05-30 DIAGNOSIS — K649 Unspecified hemorrhoids: Secondary | ICD-10-CM | POA: Diagnosis not present

## 2015-05-30 DIAGNOSIS — R739 Hyperglycemia, unspecified: Secondary | ICD-10-CM | POA: Diagnosis not present

## 2015-05-30 MED ORDER — PANTOPRAZOLE SODIUM 40 MG PO TBEC: DELAYED_RELEASE_TABLET | ORAL | Status: DC

## 2015-05-30 MED ORDER — HYDROCODONE-ACETAMINOPHEN 5-325 MG PO TABS
1.0000 | ORAL_TABLET | Freq: Four times a day (QID) | ORAL | Status: DC | PRN
Start: 2015-05-30 — End: 2015-12-09

## 2015-05-30 NOTE — Telephone Encounter (Signed)
Last refill on 02/21/2015  #60 with 0 refills and last OV 05/26/2015

## 2015-05-30 NOTE — Telephone Encounter (Signed)
Patient chose to stop Tizanidine at her visit last week, has continue Cyclobenzaprine. Can have refill on Hydrocodone but warn her that this is not a long term pain option. Since she is having surgery this week will renew but next month will start cutting back on quantity

## 2015-05-30 NOTE — Telephone Encounter (Signed)
Called the patient informed of PCP instructions on medications. Patient verbally understood and will pickup hardcopy of hydrocodone at the front desk.

## 2015-05-30 NOTE — Telephone Encounter (Signed)
Called left message to call back 

## 2015-05-30 NOTE — Telephone Encounter (Signed)
Printed and on counter.

## 2015-05-31 ENCOUNTER — Other Ambulatory Visit: Payer: Self-pay | Admitting: General Surgery

## 2015-06-02 ENCOUNTER — Ambulatory Visit (HOSPITAL_BASED_OUTPATIENT_CLINIC_OR_DEPARTMENT_OTHER): Payer: 59 | Admitting: Anesthesiology

## 2015-06-02 ENCOUNTER — Encounter (HOSPITAL_BASED_OUTPATIENT_CLINIC_OR_DEPARTMENT_OTHER)
Admission: RE | Disposition: A | Discharge status: Home / Self Care | Payer: Self-pay | Source: Ambulatory Visit | Attending: General Surgery

## 2015-06-02 ENCOUNTER — Ambulatory Visit (HOSPITAL_BASED_OUTPATIENT_CLINIC_OR_DEPARTMENT_OTHER)
Admission: RE | Admit: 2015-06-02 | Discharge: 2015-06-02 | Disposition: A | Discharge status: Home / Self Care | Payer: 59 | Source: Ambulatory Visit | Attending: General Surgery | Admitting: General Surgery

## 2015-06-02 ENCOUNTER — Other Ambulatory Visit: Payer: Self-pay | Admitting: Family Medicine

## 2015-06-02 ENCOUNTER — Encounter (HOSPITAL_BASED_OUTPATIENT_CLINIC_OR_DEPARTMENT_OTHER): Payer: Self-pay | Admitting: *Deleted

## 2015-06-02 DIAGNOSIS — F419 Anxiety disorder, unspecified: Secondary | ICD-10-CM | POA: Diagnosis not present

## 2015-06-02 DIAGNOSIS — Z87891 Personal history of nicotine dependence: Secondary | ICD-10-CM | POA: Diagnosis not present

## 2015-06-02 DIAGNOSIS — F329 Major depressive disorder, single episode, unspecified: Secondary | ICD-10-CM | POA: Diagnosis not present

## 2015-06-02 DIAGNOSIS — I82891 Chronic embolism and thrombosis of other specified veins: Secondary | ICD-10-CM | POA: Insufficient documentation

## 2015-06-02 DIAGNOSIS — R2232 Localized swelling, mass and lump, left upper limb: Secondary | ICD-10-CM | POA: Diagnosis present

## 2015-06-02 DIAGNOSIS — I10 Essential (primary) hypertension: Secondary | ICD-10-CM | POA: Insufficient documentation

## 2015-06-02 DIAGNOSIS — G40909 Epilepsy, unspecified, not intractable, without status epilepticus: Secondary | ICD-10-CM | POA: Diagnosis not present

## 2015-06-02 DIAGNOSIS — Z79899 Other long term (current) drug therapy: Secondary | ICD-10-CM | POA: Insufficient documentation

## 2015-06-02 DIAGNOSIS — K219 Gastro-esophageal reflux disease without esophagitis: Secondary | ICD-10-CM | POA: Diagnosis not present

## 2015-06-02 DIAGNOSIS — R928 Other abnormal and inconclusive findings on diagnostic imaging of breast: Secondary | ICD-10-CM

## 2015-06-02 HISTORY — DX: Gastro-esophageal reflux disease without esophagitis: K21.9

## 2015-06-02 HISTORY — PX: MASS EXCISION: SHX2000

## 2015-06-02 HISTORY — DX: Anxiety disorder, unspecified: F41.9

## 2015-06-02 SURGERY — EXCISION MASS
Anesthesia: General | Site: Arm Upper | Laterality: Left

## 2015-06-02 MED ORDER — BUPIVACAINE HCL (PF) 0.25 % IJ SOLN
INTRAMUSCULAR | Status: AC
  Filled 2015-06-02: qty 30

## 2015-06-02 MED ORDER — CHLORHEXIDINE GLUCONATE 4 % EX LIQD: 1.0000 "application " | Freq: Once | CUTANEOUS | Status: DC

## 2015-06-02 MED ORDER — PROPOFOL 10 MG/ML IV BOLUS
INTRAVENOUS | Status: DC | PRN
  Administered 2015-06-02: 150 mg via INTRAVENOUS

## 2015-06-02 MED ORDER — MIDAZOLAM HCL 2 MG/2ML IJ SOLN
1.0000 mg | INTRAMUSCULAR | Status: DC | PRN
  Administered 2015-06-02: 2 mg via INTRAVENOUS

## 2015-06-02 MED ORDER — OXYCODONE-ACETAMINOPHEN 5-325 MG PO TABS: 1.0000 | ORAL_TABLET | ORAL | Status: DC | PRN

## 2015-06-02 MED ORDER — MIDAZOLAM HCL 2 MG/2ML IJ SOLN
INTRAMUSCULAR | Status: AC
  Filled 2015-06-02: qty 2

## 2015-06-02 MED ORDER — LIDOCAINE HCL (CARDIAC) 20 MG/ML IV SOLN
INTRAVENOUS | Status: AC
  Filled 2015-06-02: qty 5

## 2015-06-02 MED ORDER — LACTATED RINGERS IV SOLN
INTRAVENOUS | Status: DC
  Administered 2015-06-02: 10 mL/h via INTRAVENOUS

## 2015-06-02 MED ORDER — SCOPOLAMINE 1 MG/3DAYS TD PT72: 1.0000 | MEDICATED_PATCH | Freq: Once | TRANSDERMAL | Status: DC | PRN

## 2015-06-02 MED ORDER — DEXAMETHASONE SODIUM PHOSPHATE 4 MG/ML IJ SOLN
INTRAMUSCULAR | Status: DC | PRN
  Administered 2015-06-02: 10 mg via INTRAVENOUS

## 2015-06-02 MED ORDER — DEXAMETHASONE SODIUM PHOSPHATE 10 MG/ML IJ SOLN
INTRAMUSCULAR | Status: AC
  Filled 2015-06-02: qty 1

## 2015-06-02 MED ORDER — FENTANYL CITRATE (PF) 100 MCG/2ML IJ SOLN
50.0000 ug | INTRAMUSCULAR | Status: DC | PRN
  Administered 2015-06-02: 50 ug via INTRAVENOUS

## 2015-06-02 MED ORDER — MEPERIDINE HCL 25 MG/ML IJ SOLN: 6.2500 mg | INTRAMUSCULAR | Status: DC | PRN

## 2015-06-02 MED ORDER — LIDOCAINE HCL (CARDIAC) 20 MG/ML IV SOLN
INTRAVENOUS | Status: DC | PRN
  Administered 2015-06-02: 50 mg via INTRAVENOUS

## 2015-06-02 MED ORDER — GLYCOPYRROLATE 0.2 MG/ML IJ SOLN: 0.2000 mg | Freq: Once | INTRAMUSCULAR | Status: DC | PRN

## 2015-06-02 MED ORDER — BUPIVACAINE HCL (PF) 0.25 % IJ SOLN
INTRAMUSCULAR | Status: DC | PRN
  Administered 2015-06-02: 3 mL

## 2015-06-02 MED ORDER — FENTANYL CITRATE (PF) 100 MCG/2ML IJ SOLN
INTRAMUSCULAR | Status: AC
  Filled 2015-06-02: qty 2

## 2015-06-02 MED ORDER — ONDANSETRON HCL 4 MG/2ML IJ SOLN
INTRAMUSCULAR | Status: DC | PRN
  Administered 2015-06-02: 4 mg via INTRAVENOUS

## 2015-06-02 MED ORDER — ONDANSETRON HCL 4 MG/2ML IJ SOLN
INTRAMUSCULAR | Status: AC
  Filled 2015-06-02: qty 2

## 2015-06-02 MED ORDER — LIDOCAINE HCL (PF) 1 % IJ SOLN
INTRAMUSCULAR | Status: AC
  Filled 2015-06-02: qty 30

## 2015-06-02 MED ORDER — CEFAZOLIN SODIUM-DEXTROSE 2-3 GM-% IV SOLR
2.0000 g | INTRAVENOUS | Status: AC
  Administered 2015-06-02: 2 g via INTRAVENOUS

## 2015-06-02 MED ORDER — BUPIVACAINE-EPINEPHRINE (PF) 0.25% -1:200000 IJ SOLN
INTRAMUSCULAR | Status: AC
  Filled 2015-06-02: qty 30

## 2015-06-02 MED ORDER — FENTANYL CITRATE (PF) 100 MCG/2ML IJ SOLN: 25.0000 ug | INTRAMUSCULAR | Status: DC | PRN

## 2015-06-02 MED ORDER — CEFAZOLIN SODIUM-DEXTROSE 2-3 GM-% IV SOLR
INTRAVENOUS | Status: AC
  Filled 2015-06-02: qty 50

## 2015-06-02 MED ORDER — PROPOFOL 10 MG/ML IV BOLUS
INTRAVENOUS | Status: AC
  Filled 2015-06-02: qty 20

## 2015-06-02 SURGICAL SUPPLY — 34 items
BLADE SURG 10 STRL SS (BLADE) ×2 IMPLANT
BLADE SURG 15 STRL LF DISP TIS (BLADE) ×1 IMPLANT
BLADE SURG 15 STRL SS (BLADE) ×2
CHLORAPREP W/TINT 26ML (MISCELLANEOUS) ×2 IMPLANT
COVER BACK TABLE 60X90IN (DRAPES) ×2 IMPLANT
COVER MAYO STAND STRL (DRAPES) ×2 IMPLANT
DRAPE LAPAROTOMY 100X72 PEDS (DRAPES) ×2 IMPLANT
DRAPE UTILITY XL STRL (DRAPES) ×2 IMPLANT
ELECT COATED BLADE 2.86 ST (ELECTRODE) ×2 IMPLANT
ELECT REM PT RETURN 9FT ADLT (ELECTROSURGICAL) ×2
ELECTRODE REM PT RTRN 9FT ADLT (ELECTROSURGICAL) ×1 IMPLANT
GLOVE BIO SURGEON STRL SZ7.5 (GLOVE) ×2 IMPLANT
GLOVE BIOGEL PI IND STRL 7.5 (GLOVE) IMPLANT
GLOVE BIOGEL PI INDICATOR 7.5 (GLOVE) ×1
GLOVE SURG SS PI 7.5 STRL IVOR (GLOVE) ×1 IMPLANT
GOWN STRL REUS W/ TWL LRG LVL3 (GOWN DISPOSABLE) ×2 IMPLANT
GOWN STRL REUS W/TWL LRG LVL3 (GOWN DISPOSABLE) ×4
LIQUID BAND (GAUZE/BANDAGES/DRESSINGS) ×2 IMPLANT
NDL HYPO 25X1 1.5 SAFETY (NEEDLE) IMPLANT
NEEDLE HYPO 25X1 1.5 SAFETY (NEEDLE) ×2 IMPLANT
NS IRRIG 1000ML POUR BTL (IV SOLUTION) ×2 IMPLANT
PACK BASIN DAY SURGERY FS (CUSTOM PROCEDURE TRAY) ×2 IMPLANT
PENCIL BUTTON HOLSTER BLD 10FT (ELECTRODE) ×2 IMPLANT
SLEEVE SCD COMPRESS KNEE MED (MISCELLANEOUS) ×2 IMPLANT
SPONGE LAP 18X18 X RAY DECT (DISPOSABLE) ×2 IMPLANT
SUT MON AB 4-0 PC3 18 (SUTURE) ×1 IMPLANT
SUT SILK 3 0 TIES 17X18 (SUTURE) ×2
SUT SILK 3-0 18XBRD TIE BLK (SUTURE) IMPLANT
SYR BULB 3OZ (MISCELLANEOUS) ×1 IMPLANT
SYR CONTROL 10ML LL (SYRINGE) ×1 IMPLANT
TOWEL OR 17X24 6PK STRL BLUE (TOWEL DISPOSABLE) ×4 IMPLANT
TOWEL OR NON WOVEN STRL DISP B (DISPOSABLE) ×2 IMPLANT
TUBE CONNECTING 20X1/4 (TUBING) ×1 IMPLANT
YANKAUER SUCT BULB TIP NO VENT (SUCTIONS) ×1 IMPLANT

## 2015-06-02 NOTE — Transfer of Care (Signed)
Immediate Anesthesia Transfer of Care Note  Patient: Ruth Bell  Procedure(s) Performed: Procedure(s): EXCISION LEFT ARM MASS (Left)  Patient Location: PACU  Anesthesia Type:General  Level of Consciousness: awake  Airway & Oxygen Therapy: Patient Spontanous Breathing and Patient connected to face mask oxygen  Post-op Assessment: Report given to RN and Post -op Vital signs reviewed and stable  Post vital signs: Reviewed and stable  Last Vitals:  Filed Vitals:   06/02/15 1309  BP: 172/94  Pulse: 74  Temp: 36.5 C  Resp: 18    Complications: No apparent anesthesia complications

## 2015-06-02 NOTE — Progress Notes (Signed)
Patient awake and oriented. Appropriate conversation with husband at bedside. Dr. Glennon Mac at bedside to see patient. Will call MD once patient is ready to discharge.

## 2015-06-02 NOTE — Op Note (Signed)
06/02/2015  3:14 PM  PATIENT:  Ruth Bell  55 y.o. female  PRE-OPERATIVE DIAGNOSIS:  Mass left arm   POST-OPERATIVE DIAGNOSIS:  Mass left arm   PROCEDURE:  Procedure(s): EXCISION LEFT ARM MASS (Left)  SURGEON:  Surgeon(s) and Role:    * Jovita Kussmaul, MD - Primary  PHYSICIAN ASSISTANT:   ASSISTANTS: none   ANESTHESIA:   general  EBL:  Total I/O In: 700 [I.V.:700] Out: 2 [Blood:2]  BLOOD ADMINISTERED:none  DRAINS: none   LOCAL MEDICATIONS USED:  MARCAINE     SPECIMEN:  Source of Specimen:  mass left arm, 1cm  DISPOSITION OF SPECIMEN:  PATHOLOGY  COUNTS:  YES  TOURNIQUET:  * No tourniquets in log *  DICTATION: .Dragon Dictation   After informed consent was obtained the patient was brought to the operating room and placed in the supine position on the operating room table. After adequate induction of general anesthesia the patient's left arm was prepped with ChloraPrep, allowed to dry, draped in usual sterile manner. An appropriate timeout was performed. The patient had a mobile palpable mass in the upper left arm just above the antecubital fossa and on the radial side the area around the mass was treated with quarter percent Marcaine. A small longitudinal incision was made with a 15 blade knife overlying the mass. The incision was carried through the skin and into the subcutaneous tissue sharply with the 15 blade knife. Blunt dissection was then carried out in the area until the mass was identified. The mass appeared to be a chronic clot and a small vein in the antecubital fossa. The vein was ligated above and below the mass with 3-0 silk ties and the vessel was divided at the edge of the mass so that the mass could be removed. Once the mass was removed it was sent to pathology for further evaluation. The area was examined and found to be hemostatic. The wound was irrigated with copious amounts of saline. The skin incision was then closed with a running 4-0 Monocryl  subcuticular stitch. Dermabond dressings were applied. The patient tolerated the procedure well. At the end of the case all needle sponge and instrument counts were correct. The patient was then awakened and taken to recovery in stable condition.  PLAN OF CARE: Discharge to home after PACU  PATIENT DISPOSITION:  PACU - hemodynamically stable.   Delay start of Pharmacological VTE agent (>24hrs) due to surgical blood loss or risk of bleeding: not applicable

## 2015-06-02 NOTE — Anesthesia Postprocedure Evaluation (Signed)
Anesthesia Post Note  Patient: Ruth Bell  Procedure(s) Performed: Procedure(s) (LRB): EXCISION LEFT ARM MASS (Left)  Patient location during evaluation: PACU Anesthesia Type: General Level of consciousness: awake and alert Pain management: pain level controlled Vital Signs Assessment: post-procedure vital signs reviewed and stable Respiratory status: spontaneous breathing, nonlabored ventilation, respiratory function stable and patient connected to face mask oxygen Cardiovascular status: blood pressure returned to baseline and stable Postop Assessment: no signs of nausea or vomiting Anesthetic complications: no Comments: Around 1545 pt was being prepared to move to Phase 2 of PACU when she had a tonic clonic seizure.  Her airway was supported at all times with face mask O2.  She had her usual dose of Keppra today. She had not related any hx of recent seizure activity to me pre op but her husband says she has these fairly often and they let her "sleep it off". Dr. Marlou Starks was unable to contact with her neurologist in [facility] but the on call neurologist for [facility] felt if she remained without another episode, she could go home.  Otherwise, she would need to be seen by neuro in the ED. We will watch her for about one hour more.    Last Vitals:  Filed Vitals:   06/02/15 1545 06/02/15 1548  BP: 175/120 178/96  Pulse:  84  Temp:    Resp: 35 25    Last Pain:  Filed Vitals:   06/02/15 1549  PainSc: 6                  Rozetta Stumpp A

## 2015-06-02 NOTE — Progress Notes (Signed)
1530 pt sitting up o2 off alert with no pain. [place] Dr Marlou Starks in to see pt  1545 Pt talking with Dr Marlou Starks and began with full body seizure. Rolled to lt side o2 placed on pt suctions small amt of clear froth. 1550 Seizure activity ceased and pt is post ictal. Dr Ace Gins and Marlou Starks at bedside.   [place] Dr Marlou Starks spoke with husband state this is normal for pt. She sleeps and then back to baseline.  He does not want patient to go to hospital, states last seizure was 2 weeks ago.  1615 pt sluggish with speech but response to verbal commands and moving all extremetie 1630 Dr Marlou Starks attempted to contact Dr Sabra Heck in [place] [facility] , pt's neurologist not on call  1640 Dr Marlou Starks spoke with neurologist on call for [facility] Dr Richmond Campbell, who advised if pt did have another seizure to transfer to er and have on call neurologist see pt.  Watch pt for several hours and discharge home if no further problems 1655 Pt's husband at bedside, pt still slightly confused of location and day.   1700 Pt placed on bedpan moving well and following commands  1700 Care transferred to [facility] ,  Will call Dr Marlou Starks and Dr Glennon Mac if any problems

## 2015-06-02 NOTE — Anesthesia Preprocedure Evaluation (Addendum)
Anesthesia Evaluation  Patient identified by MRN, date of birth, ID band Patient awake    Reviewed: Allergy & Precautions, NPO status , Patient's Chart, lab work & pertinent test results  Airway Mallampati: I  TM Distance: >3 FB Neck ROM: Full    Dental  (+) Dental Advisory Given, Teeth Intact   Pulmonary Current Smoker,    breath sounds clear to auscultation       Cardiovascular hypertension, Pt. on medications  Rhythm:Regular Rate:Normal     Neuro/Psych    GI/Hepatic GERD  Medicated and Controlled,  Endo/Other    Renal/GU      Musculoskeletal   Abdominal   Peds  Hematology   Anesthesia Other Findings Pt wears sunglasses constantly due to light irritation.  Reproductive/Obstetrics                            Anesthesia Physical Anesthesia Plan  ASA: II  Anesthesia Plan: General   Post-op Pain Management:    Induction: Intravenous  Airway Management Planned: LMA  Additional Equipment:   Intra-op Plan:   Post-operative Plan: Extubation in OR  Informed Consent: I have reviewed the patients History and Physical, chart, labs and discussed the procedure including the risks, benefits and alternatives for the proposed anesthesia with the patient or authorized representative who has indicated his/her understanding and acceptance.   Dental advisory given  Plan Discussed with: CRNA, Anesthesiologist and Surgeon  Anesthesia Plan Comments:         Anesthesia Quick Evaluation

## 2015-06-02 NOTE — Progress Notes (Signed)
Patient did not get prescription before she left. Husband said that they already had medication and did not need prescription. Prescription shredded. Setzer, Marchelle Folks

## 2015-06-02 NOTE — Progress Notes (Signed)
Patient had seizure as I was talking to her in PACU. Vitals remained good and stable. Breathing comfortably postictal. I could not reach her neurologist in [place] so I called the neurologist on call. She has a history of seizures and has them at home not infrequently.  He said if she remained stable she could go home with follow up to her neurologist. If she seized again then she would need to go to the ER and be evaluated. I spoke to the family and they are familiar with how to manage her seizures and wants to take her home. She is on Keppra and did take her morning dose.

## 2015-06-02 NOTE — Interval H&P Note (Signed)
History and Physical Interval Note:  06/02/2015 2:10 PM  Ruth Bell  has presented today for surgery, with the diagnosis of Mass left arm   The various methods of treatment have been discussed with the patient and family. After consideration of risks, benefits and other options for treatment, the patient has consented to  Procedure(s): EXCISION LEFT ARM MASS (Left) as a surgical intervention .  The patient's history has been reviewed, patient examined, no change in status, stable for surgery.  I have reviewed the patient's chart and labs.  Questions were answered to the patient's satisfaction.     TOTH III,Lateshia Schmoker S

## 2015-06-02 NOTE — Discharge Instructions (Signed)

## 2015-06-02 NOTE — Anesthesia Procedure Notes (Signed)
Procedure Name: LMA Insertion Date/Time: 06/02/2015 2:40 PM Performed by: Lieutenant Diego Pre-anesthesia Checklist: Patient identified, Emergency Drugs available, Suction available and Patient being monitored Patient Re-evaluated:Patient Re-evaluated prior to inductionOxygen Delivery Method: Circle System Utilized Preoxygenation: Pre-oxygenation with 100% oxygen Intubation Type: IV induction Ventilation: Mask ventilation without difficulty LMA: LMA inserted LMA Size: 3.0 Number of attempts: 1 Airway Equipment and Method: Bite block Placement Confirmation: positive ETCO2 and breath sounds checked- equal and bilateral Tube secured with: Tape Dental Injury: Teeth and Oropharynx as per pre-operative assessment
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
## 2015-06-03 ENCOUNTER — Encounter (HOSPITAL_BASED_OUTPATIENT_CLINIC_OR_DEPARTMENT_OTHER): Payer: Self-pay | Admitting: General Surgery

## 2015-06-05 NOTE — Assessment & Plan Note (Signed)
Encouraged increased hydration and fiber in diet. Daily probiotics. If bowels not moving can use MOM 2 tbls po in 4 oz of warm prune juice by mouth every 2-3 days. If no results then repeat in 4 hours with  Dulcolax suppository pr, may repeat again in 4 more hours as needed. Seek care if symptoms worsen. Consider daily Miralax and/or Dulcolax if symptoms persist.  

## 2015-06-05 NOTE — Assessment & Plan Note (Signed)
Well controlled, no changes to meds. Encouraged heart healthy diet such as the DASH diet and exercise as tolerated.  °

## 2015-06-05 NOTE — Progress Notes (Signed)
Patient ID: Ruth Bell, female   DOB: 04/13/1960, 55 y.o.   MRN: [id number]   Subjective:    Patient ID: Ruth Bell, female    DOB: 1960/07/06, 55 y.o.   MRN: [id number]  No chief complaint on file.   HPI Patient is in today for follow up. She continues to struggle with fatigue, back pain, malaise, myalgias, anxiety and depression. Denies suicidal ideation but confirms anhedonia. No hospitalization or acute illness. Denies CP/palp/SOB/HA/congestion/fevers/GI or GU c/o. Taking meds as prescribed  Past Medical History  Diagnosis Date  . Migraine, unspecified, without mention of intractable migraine without mention of status migrainosus   . Anemia   . Menometrorrhagia 2006  . Uterine leiomyoma 2006  . Depression   . Hypertension   . Tubular adenoma of colon 05/2011  . Constipation 12/27/2013  . Arm skin lesion, left 09/05/2014  . Hyperglycemia 01/15/2015  . Anxiety   . GERD (gastroesophageal reflux disease)   . Seizures ([place])     history of pseudoseizure disorder, last last seizure 3 wks, keppra inc in dosage    Past Surgical History  Procedure Laterality Date  . Appendectomy    . Bilateral salpingoophorectomy  04/06/04  . Abdominal hysterectomy  04/06/04  . Rotator cuff repair    . Mass excision Left 06/02/2015    Procedure: EXCISION LEFT ARM MASS;  Surgeon: Autumn Messing III, MD;  Location: [facility];  Service: General;  Laterality: Left;    Family History  Problem Relation Age of Onset  . Heart disease Mother   . Heart attack Mother   . Cancer Father     colon  . Hypertension Father   . Cancer Sister     brain  . Cancer Brother   . Hypertension Sister   . Cancer Sister     unsure of type  . Diabetes Sister     type 2  . Heart attack Sister   . Hypertension Brother     Social History   Social History  . Marital Status: Married    Spouse Name: Gwyndolyn Saxon  . Number of Children: N/A  . Years of Education: N/A   Occupational History  . CMA     Social History Main Topics  . Smoking status: Current Every Day Smoker -- 0.50 packs/day for 40 years    Types: Cigarettes    Start date: 11/17/2013  . Smokeless tobacco: Never Used     Comment: 3-4 cigarettes daily  . Alcohol Use: No  . Drug Use: No  . Sexual Activity: Not on file     Comment: lives with husband, no dietary restrictions.    Other Topics Concern  . Not on file   Social History Narrative    Outpatient Prescriptions Prior to Visit  Medication Sig Dispense Refill  . citalopram (CELEXA) 20 MG tablet TAKE 1 TABLET (20 MG TOTAL) BY MOUTH DAILY. 30 tablet 0  . gabapentin (NEURONTIN) 600 MG tablet Take 600 mg by mouth 3 (three) times daily. Reported on 05/26/2015    . metoprolol tartrate (LOPRESSOR) 25 MG tablet Take 25 mg by mouth 2 (two) times daily.    . pantoprazole (PROTONIX) 40 MG tablet TAKE 1 TABLET (40 MG TOTAL) BY MOUTH DAILY. 30 tablet 11  . cyclobenzaprine (FLEXERIL) 10 MG tablet Take 1 tablet (10 mg total) by mouth 3 (three) times daily as needed for muscle spasms. 90 tablet 1  . ibuprofen (ADVIL,MOTRIN) 200 MG tablet Take 400 mg by mouth every  6 (six) hours as needed for mild pain or moderate pain. Reported on 05/26/2015    . HYDROcodone-acetaminophen (NORCO/VICODIN) 5-325 MG tablet Take 1 tablet by mouth every 6 (six) hours as needed for moderate pain. (Patient not taking: Reported on 05/26/2015) 60 tablet 0  . levETIRAcetam (KEPPRA) 500 MG tablet Take 1 tablet (500 mg total) by mouth 2 (two) times daily. 60 tablet 0   No facility-administered medications prior to visit.    No Known Allergies  Review of Systems  Constitutional: Positive for weight loss and malaise/fatigue. Negative for fever and chills.  HENT: Negative for congestion and hearing loss.   Eyes: Negative for discharge.  Respiratory: Negative for cough, sputum production and shortness of breath.   Cardiovascular: Negative for chest pain, palpitations and leg swelling.  Gastrointestinal:  Negative for heartburn, nausea, vomiting, abdominal pain, diarrhea, constipation and blood in stool.  Genitourinary: Positive for frequency. Negative for dysuria, urgency and hematuria.  Musculoskeletal: Positive for back pain and neck pain. Negative for myalgias and falls.  Skin: Negative for rash.  Neurological: Negative for dizziness, sensory change, loss of consciousness, weakness and headaches.  Endo/Heme/Allergies: Negative for environmental allergies. Does not bruise/bleed easily.  Psychiatric/Behavioral: Negative for depression and suicidal ideas. The patient is nervous/anxious. The patient does not have insomnia.        Objective:    Physical Exam  Constitutional: She is oriented to person, place, and time. She appears well-developed and well-nourished. No distress.  HENT:  Head: Normocephalic and atraumatic.  Nose: Nose normal.  Eyes: Right eye exhibits no discharge. Left eye exhibits no discharge.  Neck: Normal range of motion. Neck supple.  Cardiovascular: Normal rate and regular rhythm.   No murmur heard. Pulmonary/Chest: Effort normal and breath sounds normal.  Abdominal: Soft. Bowel sounds are normal. There is no tenderness.  Musculoskeletal: She exhibits no edema.  Neurological: She is alert and oriented to person, place, and time.  Skin: Skin is warm and dry.  Psychiatric: She has a normal mood and affect.  Nursing note and vitals reviewed.   BP 108/70 mmHg  Pulse 96  Temp(Src) 98 F (36.7 C) (Oral)  Ht 5\' 2"  (1.575 m)  Wt 104 lb 12.8 oz (47.537 kg)  BMI 19.16 kg/m2  SpO2 99% Wt Readings from Last 3 Encounters:  06/02/15 106 lb (48.081 kg)  05/26/15 104 lb 12.8 oz (47.537 kg)  02/21/15 102 lb 4 oz (46.38 kg)     Lab Results  Component Value Date   WBC 6.6 05/26/2015   HGB 12.5 05/26/2015   HCT 38.3 05/26/2015   PLT 289.0 05/26/2015   GLUCOSE 88 05/26/2015   CHOL 214* 05/26/2015   TRIG 104.0 05/26/2015   HDL 59.50 05/26/2015   LDLCALC 134*  05/26/2015   ALT 13 05/26/2015   AST 16 05/26/2015   NA 143 05/26/2015   K 3.3* 05/26/2015   CL 104 05/26/2015   CREATININE 0.58 05/26/2015   BUN 8 05/26/2015   CO2 29 05/26/2015   TSH 1.02 05/26/2015   INR 1.0 11/19/2013   HGBA1C 6.6* 05/26/2015    Lab Results  Component Value Date   TSH 1.02 05/26/2015   Lab Results  Component Value Date   WBC 6.6 05/26/2015   HGB 12.5 05/26/2015   HCT 38.3 05/26/2015   MCV 84.4 05/26/2015   PLT 289.0 05/26/2015   Lab Results  Component Value Date   NA 143 05/26/2015   K 3.3* 05/26/2015   CO2 29 05/26/2015  GLUCOSE 88 05/26/2015   BUN 8 05/26/2015   CREATININE 0.58 05/26/2015   BILITOT 0.3 05/26/2015   ALKPHOS 54 05/26/2015   AST 16 05/26/2015   ALT 13 05/26/2015   PROT 7.4 05/26/2015   ALBUMIN 4.4 05/26/2015   CALCIUM 9.3 05/26/2015   ANIONGAP 12 11/19/2014   GFR 137.96 05/26/2015   Lab Results  Component Value Date   CHOL 214* 05/26/2015   Lab Results  Component Value Date   HDL 59.50 05/26/2015   Lab Results  Component Value Date   LDLCALC 134* 05/26/2015   Lab Results  Component Value Date   TRIG 104.0 05/26/2015   Lab Results  Component Value Date   CHOLHDL 4 05/26/2015   Lab Results  Component Value Date   HGBA1C 6.6* 05/26/2015       Assessment & Plan:   Problem List Items Addressed This Visit    ANEMIA    Increase leafy greens, consider increased lean red meat and using cast iron cookware. Continue to monitor, report any concerns      Constipation    Encouraged increased hydration and fiber in diet. Daily probiotics. If bowels not moving can use MOM 2 tbls po in 4 oz of warm prune juice by mouth every 2-3 days. If no results then repeat in 4 hours with  Dulcolax suppository pr, may repeat again in 4 more hours as needed. Seek care if symptoms worsen. Consider daily Miralax and/or Dulcolax if symptoms persist.       Relevant Orders   CBC (Completed)   TSH (Completed)   Hemoglobin A1c  (Completed)   Comprehensive metabolic panel (Completed)   Lipid panel (Completed)   MM Digital Screening (Completed)   Ambulatory referral to Gastroenterology   Hemorrhoids - Primary   Relevant Medications   metoprolol tartrate (LOPRESSOR) 25 MG tablet   Other Relevant Orders   CBC (Completed)   TSH (Completed)   Hemoglobin A1c (Completed)   Comprehensive metabolic panel (Completed)   Lipid panel (Completed)   MM Digital Screening (Completed)   Ambulatory referral to Gastroenterology   HTN (hypertension)    Well controlled, no changes to meds. Encouraged heart healthy diet such as the DASH diet and exercise as tolerated.       Relevant Medications   metoprolol tartrate (LOPRESSOR) 25 MG tablet   Other Relevant Orders   CBC (Completed)   TSH (Completed)   Hemoglobin A1c (Completed)   Comprehensive metabolic panel (Completed)   Lipid panel (Completed)   MM Digital Screening (Completed)   Ambulatory referral to Gastroenterology   Hyperglycemia    minimize simple carbs. Increase exercise as tolerated.       Relevant Orders   CBC (Completed)   TSH (Completed)   Hemoglobin A1c (Completed)   Comprehensive metabolic panel (Completed)   Lipid panel (Completed)   MM Digital Screening (Completed)   Ambulatory referral to Gastroenterology    Other Visit Diagnoses    History of colonic polyps        Relevant Orders    CBC (Completed)    TSH (Completed)    Hemoglobin A1c (Completed)    Comprehensive metabolic panel (Completed)    Lipid panel (Completed)    MM Digital Screening (Completed)    Ambulatory referral to Gastroenterology    Breast cancer screening        Relevant Orders    MM Digital Screening (Completed)    Depression with anxiety        Relevant Orders  Ambulatory referral to Psychology       I have discontinued Ms. Sassone's pantoprazole. I have also changed her metoprolol tartrate, ALPRAZolam, and gabapentin. Additionally, I am having her maintain her  ibuprofen, cyclobenzaprine, levETIRAcetam, and citalopram.  Meds ordered this encounter  Medications  . DISCONTD: ALPRAZolam (XANAX) 0.5 MG tablet    Sig:   . levETIRAcetam (KEPPRA) 500 MG tablet    Sig: TAKE 1 TABLET (500 MG TOTAL) BY MOUTH TWO (2) TIMES A DAY.    Refill:  3  . DISCONTD: tiZANidine (ZANAFLEX) 4 MG tablet    Sig: TAKE 1 (ONE) TABLET BY MOUTH EVERY EIGHT HOURS, AS NEEDED FOR SPASM    Refill:  0  . metoprolol tartrate (LOPRESSOR) 25 MG tablet    Sig: Take 1 tablet (25 mg total) by mouth 2 (two) times daily.    Dispense:  60 tablet    Refill:  5  . ALPRAZolam (XANAX) 0.5 MG tablet    Sig: Take 1 tablet (0.5 mg total) by mouth 2 (two) times daily as needed for anxiety.    Dispense:  60 tablet    Refill:  0  . DISCONTD: pantoprazole (PROTONIX) 40 MG tablet    Sig: TAKE 1 TABLET (40 MG TOTAL) BY MOUTH DAILY.    Dispense:  30 tablet    Refill:  11  . gabapentin (NEURONTIN) 600 MG tablet    Sig: Take 1 tablet (600 mg total) by mouth 3 (three) times daily. Reported on 05/26/2015    Dispense:  90 tablet    Refill:  2  . citalopram (CELEXA) 20 MG tablet    Sig: TAKE 1 TABLET (20 MG TOTAL) BY MOUTH DAILY.    Dispense:  30 tablet    Refill:  0     [name], MD

## 2015-06-05 NOTE — Assessment & Plan Note (Signed)
minimize simple carbs. Increase exercise as tolerated.  

## 2015-06-05 NOTE — Assessment & Plan Note (Signed)
Increase leafy greens, consider increased lean red meat and using cast iron cookware. Continue to monitor, report any concerns 

## 2015-06-08 ENCOUNTER — Telehealth: Payer: Self-pay | Admitting: Family Medicine

## 2015-06-08 MED FILL — HYDROCODON-APAP 5-325: 5-325 | 15 days supply | Qty: 60 | Fill #0

## 2015-06-08 NOTE — Telephone Encounter (Signed)
Relation to [id number] Call back number:[phone]   Reason for call:  Patient inquiring about imaging results

## 2015-06-08 NOTE — Telephone Encounter (Signed)
Left message for pt that Dr. Charlett Blake has not received the imaging results today and we will call her as soon as we have them.

## 2015-06-27 ENCOUNTER — Ambulatory Visit
Admission: RE | Admit: 2015-06-27 | Discharge: 2015-06-27 | Disposition: A | Discharge status: Home / Self Care | Payer: 59 | Source: Ambulatory Visit | Attending: Family Medicine | Admitting: Family Medicine

## 2015-06-27 DIAGNOSIS — R928 Other abnormal and inconclusive findings on diagnostic imaging of breast: Secondary | ICD-10-CM

## 2015-06-30 ENCOUNTER — Other Ambulatory Visit (INDEPENDENT_AMBULATORY_CARE_PROVIDER_SITE_OTHER): Payer: 59

## 2015-06-30 DIAGNOSIS — E876 Hypokalemia: Secondary | ICD-10-CM

## 2015-06-30 LAB — COMPREHENSIVE METABOLIC PANEL
ALT: 12 U/L (ref 6–29)
AST: 21 U/L (ref 10–35)
Albumin: 4.3 g/dL (ref 3.6–5.1)
Alkaline Phosphatase: 63 U/L (ref 33–130)
BUN: 12 mg/dL (ref 7–25)
CO2: 25 mmol/L (ref 20–31)
Calcium: 9.3 mg/dL (ref 8.6–10.4)
Chloride: 103 mmol/L (ref 98–110)
Creat: 0.56 mg/dL (ref 0.50–1.05)
Glucose, Bld: 86 mg/dL (ref 65–99)
Potassium: 4.4 mmol/L (ref 3.5–5.3)
Sodium: 143 mmol/L (ref 135–146)
Total Bilirubin: 0.5 mg/dL (ref 0.2–1.2)
Total Protein: 7.4 g/dL (ref 6.1–8.1)

## 2015-07-14 ENCOUNTER — Encounter: Payer: Self-pay | Admitting: Gastroenterology

## 2015-07-14 ENCOUNTER — Ambulatory Visit (INDEPENDENT_AMBULATORY_CARE_PROVIDER_SITE_OTHER): Payer: 59 | Admitting: Gastroenterology

## 2015-07-14 ENCOUNTER — Ambulatory Visit: Payer: 59 | Admitting: Gastroenterology

## 2015-07-14 VITALS — BP 118/68 | HR 80 | Ht 62.0 in | Wt 105.8 lb

## 2015-07-14 DIAGNOSIS — Z86010 Personal history of colonic polyps: Secondary | ICD-10-CM

## 2015-07-14 DIAGNOSIS — R634 Abnormal weight loss: Secondary | ICD-10-CM

## 2015-07-14 DIAGNOSIS — K5900 Constipation, unspecified: Secondary | ICD-10-CM | POA: Diagnosis not present

## 2015-07-14 DIAGNOSIS — K921 Melena: Secondary | ICD-10-CM | POA: Diagnosis not present

## 2015-07-14 MED ORDER — NA SULFATE-K SULFATE-MG SULF 17.5-3.13-1.6 GM/177ML PO SOLN: 1.0000 | Freq: Once | ORAL | Status: DC

## 2015-07-14 NOTE — Progress Notes (Signed)
    History of Present Illness: This is a 55 year old female with a history of adenomatous colon polyps who notes constipation for a few months, 2 small episodes of BRBPR with bowel movements and about a 10 lb weight loss.   Current Medications, Allergies, Past Medical History, Past Surgical History, Family History and Social History were reviewed in [employer] record.  Physical Exam: General: Well developed, well nourished, no acute distress Head: Normocephalic and atraumatic Eyes:  sclerae anicteric, EOMI Ears: Normal auditory acuity Mouth: No deformity or lesions Lungs: Clear throughout to auscultation Heart: Regular rate and rhythm; no murmurs, rubs or bruits Abdomen: Soft, non tender and non distended. No masses, hepatosplenomegaly or hernias noted. Normal Bowel sounds Rectal: deferred to colonoscopy Musculoskeletal: Symmetrical with no gross deformities  Pulses:  Normal pulses noted Extremities: No clubbing, cyanosis, edema or deformities noted Neurological: Alert oriented x 4, grossly nonfocal Psychological:  Alert and cooperative. Normal mood and affect  Assessment and Recommendations:  1. Personal history of adenomatous colon polyps, constipation, 2 episodes of hematochezia, father with colon cancer. MOM qd or qod and a high fiber diet for constipation. Schedule colonoscopy. The risks (including bleeding, perforation, infection, missed lesions, medication reactions and possible hospitalization or surgery if complications occur), benefits, and alternatives to colonoscopy with possible biopsy and possible polypectomy were discussed with the patient and they consent to proceed.

## 2015-07-14 NOTE — Patient Instructions (Addendum)
You can take milk of magnesia every day to every other day to regulate your bowel movements.  You have been scheduled for a colonoscopy. Please follow written instructions given to you at your visit today.  Please pick up your prep supplies at the pharmacy within the next 1-3 days. If you use inhalers (even only as needed), please bring them with you on the day of your procedure. Your physician has requested that you go to www.startemmi.com and enter the access code given to you at your visit today. This web site gives a general overview about your procedure. However, you should still follow specific instructions given to you by our office regarding your preparation for the procedure.  Thank you for choosing me and [facility].  Pricilla Riffle. Dagoberto Ligas., MD., [facility]

## 2015-07-20 ENCOUNTER — Other Ambulatory Visit: Payer: Self-pay | Admitting: Family Medicine

## 2015-07-27 ENCOUNTER — Encounter: Payer: Self-pay | Admitting: Gastroenterology

## 2015-07-27 ENCOUNTER — Ambulatory Visit (AMBULATORY_SURGERY_CENTER): Payer: 59 | Admitting: Gastroenterology

## 2015-07-27 VITALS — BP 162/93 | HR 58 | Temp 99.3°F | Resp 15 | Ht 62.0 in | Wt 105.0 lb

## 2015-07-27 DIAGNOSIS — Z86010 Personal history of colonic polyps: Secondary | ICD-10-CM | POA: Diagnosis not present

## 2015-07-27 DIAGNOSIS — K921 Melena: Secondary | ICD-10-CM | POA: Diagnosis not present

## 2015-07-27 DIAGNOSIS — D121 Benign neoplasm of appendix: Secondary | ICD-10-CM | POA: Diagnosis not present

## 2015-07-27 DIAGNOSIS — D120 Benign neoplasm of cecum: Secondary | ICD-10-CM

## 2015-07-27 DIAGNOSIS — Z800 Family history of malignant neoplasm of digestive organs: Secondary | ICD-10-CM | POA: Diagnosis not present

## 2015-07-27 DIAGNOSIS — D123 Benign neoplasm of transverse colon: Secondary | ICD-10-CM

## 2015-07-27 MED ORDER — SODIUM CHLORIDE 0.9 % IV SOLN: 500.0000 mL | INTRAVENOUS | Status: DC

## 2015-07-27 NOTE — Op Note (Signed)
[facility] Patient Name: Ruth Bell Procedure Date: 07/27/2015 4:57 PM MRN: [id number] Endoscopist: Ladene Artist , MD Age: 55 Date of Birth: November 20, 1960 Gender:  Procedure:                Colonoscopy Indications:              Evaluation of unexplained GI bleeding,                            Hematochezia, Personal history of adenomatous colon                            polyps, Family history of colon cancer. Medicines:                Monitored Anesthesia Care Procedure:                Pre-Anesthesia Assessment:                           - Prior to the procedure, a History and Physical                            was performed, and patient medications and                            allergies were reviewed. The patient's tolerance of                            previous anesthesia was also reviewed. The risks                            and benefits of the procedure and the sedation                            options and risks were discussed with the patient.                            All questions were answered, and informed consent                            was obtained. Prior Anticoagulants: The patient has                            taken no previous anticoagulant or antiplatelet                            agents. ASA Grade Assessment: II - A patient with                            mild systemic disease. After reviewing the risks                            and benefits, the patient was deemed in  satisfactory condition to undergo the procedure.                           After obtaining informed consent, the colonoscope                            was passed under direct vision. Throughout the                            procedure, the patient's blood pressure, pulse, and                            oxygen saturations were monitored continuously. The                            Model PCF-H190L [id number]) scope was introduced      through the anus and advanced to the the cecum,                            identified by appendiceal orifice and ileocecal                            valve. The colonoscopy was performed without                            difficulty. The patient tolerated the procedure                            well. The quality of the bowel preparation was                            adequate. The ileocecal valve, appendiceal orifice,                            and rectum were photographed. Scope In: 5:05:25 PM Scope Out: 5:24:30 PM Scope Withdrawal Time: 0 hours 15 minutes 30 seconds  Total Procedure Duration: 0 hours 19 minutes 5 seconds  Findings:                 The digital rectal exam was normal.                           A 5 mm polyp was found in the appendiceal orifice.                            The polyp was sessile. The polyp was removed with a                            cold snare. Resection and retrieval were complete.                           Two sessile polyps were found in the transverse  colon, proximal and separate from the tattoo. The                            polyps were 6 to 7 mm in size. These polyps were                            removed with a cold snare. Resection and retrieval                            were complete.                           A tattoo was seen in the transverse colon. There                            was no evidence of residual polyp tissue.                           Internal hemorrhoids were found during                            retroflexion. The hemorrhoids were medium-sized and                            Grade I (internal hemorrhoids that do not prolapse).                           The exam was otherwise normal throughout the                            examined colon. Complications:            No immediate complications. Estimated Blood Loss:     Estimated blood loss: none. Impression:               - One 5 mm polyp at the  appendiceal orifice,                            removed with a cold snare. Resected and retrieved.                           - Two 6 to 7 mm polyps in the transverse colon,                            removed with a cold snare. Resected and retrieved.                           - A tattoo was seen at the splenic flexure. A                            post-polypectomy scar was found at the tattoo site.                            There was no evidence  of residual polyp tissue.                           - Internal hemorrhoids. Recommendation:           - Patient has a contact number available for                            emergencies. The signs and symptoms of potential                            delayed complications were discussed with the                            patient. Return to normal activities tomorrow.                            Written discharge instructions were provided to the                            patient.                           - High fiber diet.                           - Continue present medications.                           - Await pathology results.                           - Repeat colonoscopy in 5 years for surveillance.                           - Prep H supp daily as needed for hemorrhoidal                            symptoms Ladene Artist, MD 07/27/2015 5:32:22 PM This report has been signed electronically.

## 2015-07-27 NOTE — Progress Notes (Signed)
A/ox3 pleased with MAC, report to Kristen RN 

## 2015-07-27 NOTE — Patient Instructions (Signed)
YOU HAD AN ENDOSCOPIC PROCEDURE TODAY AT [facility]:   Refer to the procedure report that was given to you for any specific questions about what was found during the examination.  If the procedure report does not answer your questions, please call your gastroenterologist to clarify.  If you requested that your care partner not be given the details of your procedure findings, then the procedure report has been included in a sealed envelope for you to review at your convenience later.  YOU SHOULD EXPECT: Some feelings of bloating in the abdomen. Passage of more gas than usual.  Walking can help get rid of the air that was put into your GI tract during the procedure and reduce the bloating. If you had a lower endoscopy (such as a colonoscopy or flexible sigmoidoscopy) you may notice spotting of blood in your stool or on the toilet paper. If you underwent a bowel prep for your procedure, you may not have a normal bowel movement for a few days.  Please Note:  You might notice some irritation and congestion in your nose or some drainage.  This is from the oxygen used during your procedure.  There is no need for concern and it should clear up in a day or so.  SYMPTOMS TO REPORT IMMEDIATELY:   Following lower endoscopy (colonoscopy or flexible sigmoidoscopy):  Excessive amounts of blood in the stool  Significant tenderness or worsening of abdominal pains  Swelling of the abdomen that is new, acute  Fever of 100F or higher  For urgent or emergent issues, a gastroenterologist can be reached at any hour by calling [phone].   DIET: Your first meal following the procedure should be a small meal and then it is ok to progress to your normal diet. Heavy or fried foods are harder to digest and may make you feel nauseous or bloated.  Likewise, meals heavy in dairy and vegetables can increase bloating.  Drink plenty of fluids but you should avoid alcoholic beverages for 24  hours.  ACTIVITY:  You should plan to take it easy for the rest of today and you should NOT DRIVE or use heavy machinery until tomorrow (because of the sedation medicines used during the test).    FOLLOW UP: Our staff will call the number listed on your records the next business day following your procedure to check on you and address any questions or concerns that you may have regarding the information given to you following your procedure. If we do not reach you, we will leave a message.  However, if you are feeling well and you are not experiencing any problems, there is no need to return our call.  We will assume that you have returned to your regular daily activities without incident.  If any biopsies were taken you will be contacted by phone or by letter within the next 1-3 weeks.  Please call us at [phone] if you have not heard about the biopsies in 3 weeks.    SIGNATURES/CONFIDENTIALITY: You and/or your care partner have signed paperwork which will be entered into your electronic medical record.  These signatures attest to the fact that that the information above on your After Visit Summary has been reviewed and is understood.  Full responsibility of the confidentiality of this discharge information lies with you and/or your care-partner.  Please read over handouts about polyps, hemorrhoids and high fiber diets  Please continue your normal medications  May use Preparation H for hemorrhoidal problems-  as needed

## 2015-07-27 NOTE — Progress Notes (Signed)
Called to room to assist during endoscopic procedure.  Patient ID and intended procedure confirmed with present staff. Received instructions for my participation in the procedure from the performing physician.  

## 2015-07-28 ENCOUNTER — Encounter: Payer: Self-pay | Admitting: Medical

## 2015-07-28 ENCOUNTER — Telehealth: Payer: Self-pay

## 2015-07-28 ENCOUNTER — Ambulatory Visit (INDEPENDENT_AMBULATORY_CARE_PROVIDER_SITE_OTHER): Payer: 59 | Admitting: Medical

## 2015-07-28 ENCOUNTER — Ambulatory Visit: Payer: 59 | Admitting: Medical

## 2015-07-28 VITALS — BP 114/68 | HR 78 | Temp 98.0°F | Ht 62.0 in | Wt 105.0 lb

## 2015-07-28 DIAGNOSIS — S20462A Insect bite (nonvenomous) of left back wall of thorax, initial encounter: Secondary | ICD-10-CM | POA: Diagnosis not present

## 2015-07-28 DIAGNOSIS — R112 Nausea with vomiting, unspecified: Secondary | ICD-10-CM

## 2015-07-28 DIAGNOSIS — W57XXXA Bitten or stung by nonvenomous insect and other nonvenomous arthropods, initial encounter: Secondary | ICD-10-CM | POA: Diagnosis not present

## 2015-07-28 DIAGNOSIS — R509 Fever, unspecified: Secondary | ICD-10-CM

## 2015-07-28 LAB — CBC WITH DIFFERENTIAL/PLATELET
Basophils Absolute: 0.1 10*3/uL (ref 0.0–0.1)
Basophils Relative: 0.6 % (ref 0.0–3.0)
Eosinophils Absolute: 0.2 10*3/uL (ref 0.0–0.7)
Eosinophils Relative: 1.8 % (ref 0.0–5.0)
HCT: 41.5 % (ref 36.0–46.0)
Hemoglobin: 13.5 g/dL (ref 12.0–15.0)
Lymphocytes Relative: 28 % (ref 12.0–46.0)
Lymphs Abs: 2.5 10*3/uL (ref 0.7–4.0)
MCHC: 32.6 g/dL (ref 30.0–36.0)
MCV: 85.3 fl (ref 78.0–100.0)
Monocytes Absolute: 0.5 10*3/uL (ref 0.1–1.0)
Monocytes Relative: 5.7 % (ref 3.0–12.0)
Neutro Abs: 5.8 10*3/uL (ref 1.4–7.7)
Neutrophils Relative %: 63.9 % (ref 43.0–77.0)
Platelets: 325 10*3/uL (ref 150.0–400.0)
RBC: 4.86 Mil/uL (ref 3.87–5.11)
RDW: 16 % — ABNORMAL HIGH (ref 11.5–15.5)
WBC: 9.1 10*3/uL (ref 4.0–10.5)

## 2015-07-28 LAB — COMPREHENSIVE METABOLIC PANEL
ALT: 12 U/L (ref 0–35)
AST: 17 U/L (ref 0–37)
Albumin: 4.8 g/dL (ref 3.5–5.2)
Alkaline Phosphatase: 62 U/L (ref 39–117)
BUN: 8 mg/dL (ref 6–23)
CO2: 28 mEq/L (ref 19–32)
Calcium: 10.4 mg/dL (ref 8.4–10.5)
Chloride: 105 mEq/L (ref 96–112)
Creatinine, Ser: 0.62 mg/dL (ref 0.40–1.20)
GFR: 127.67 mL/min (ref >60.00)
Glucose, Bld: 94 mg/dL (ref 70–99)
Potassium: 5 mEq/L (ref 3.5–5.1)
Sodium: 144 mEq/L (ref 135–145)
Total Bilirubin: 0.6 mg/dL (ref 0.2–1.2)
Total Protein: 8 g/dL (ref 6.0–8.3)

## 2015-07-28 LAB — AMYLASE: Amylase: 54 U/L (ref 27–131)

## 2015-07-28 LAB — LIPASE: Lipase: 53 U/L (ref 11.0–59.0)

## 2015-07-28 MED ORDER — PROMETHAZINE HCL 25 MG/ML IJ SOLN
25.0000 mg | Freq: Once | INTRAMUSCULAR | Status: AC
  Administered 2015-07-28: 25 mg via INTRAMUSCULAR

## 2015-07-28 MED ORDER — ONDANSETRON 8 MG PO TBDP: 8.0000 mg | ORAL_TABLET | Freq: Three times a day (TID) | ORAL | Status: DC | PRN

## 2015-07-28 NOTE — Patient Instructions (Addendum)
For your tick bite and recent symptoms will rx doxycycline antibiotic(rx advisement given).   Will get cbc and tick bite studies.  For nausea will rx zofran. Will also get cmp, amylase and lipase.  Follow up on Monday or as needed.(if symptoms worsen over weekend then ED evaluation)  We did give phenergan 25 mg in office today. About 8 hours later could start zofran.  Make sure your husband drives you home since phenergan can sedate you.

## 2015-07-28 NOTE — Addendum Note (Signed)
Addended by: Tasia Catchings on: 07/28/2015 02:31 PM   Modules accepted: Orders

## 2015-07-28 NOTE — Progress Notes (Signed)
Subjective:    Patient ID: Ruth Bell, female    DOB: [birth year], 55 y.o.   MRN: [id number]  HPI  Pt in for evaluation. Pt states that on Monday she thought she felt something on her back. Next day she rubbed her back on coach and she thinks popped the tick. She is not sure how long tick may have been present. Then on Tuesday afternoon husband pin and dug head out. Pt this am had some nausea. Feels little warm. Also she has warm sensation around the area where tick bit her.   Pt later vomited twice in our office.   Review of Systems  Constitutional: Positive for fever and fatigue.  Respiratory: Negative for cough, chest tightness, shortness of breath and wheezing.   Gastrointestinal: Positive for nausea and vomiting. Negative for abdominal pain, constipation, blood in stool, abdominal distention and anal bleeding.  Musculoskeletal: Negative for myalgias.  Skin: Positive for rash.  Neurological: Negative for dizziness.  Hematological: Negative for adenopathy. Does not bruise/bleed easily.    Past Medical History  Diagnosis Date  . Migraine, unspecified, without mention of intractable migraine without mention of status migrainosus   . Anemia   . Menometrorrhagia 2006  . Uterine leiomyoma 2006  . Depression   . Hypertension   . Tubular adenoma of colon 05/2011  . Constipation 12/27/2013  . Arm skin lesion, left 09/05/2014  . Hyperglycemia 01/15/2015  . Anxiety   . GERD (gastroesophageal reflux disease)   . Seizures ([place])     history of pseudoseizure disorder, last last seizure 3 wks, keppra inc in dosage     Social History   Social History  . Marital Status: Married    Spouse Name: Gwyndolyn Saxon  . Number of Children: N/A  . Years of Education: N/A   Occupational History  . CMA    Social History Main Topics  . Smoking status: Current Every Day Smoker -- 0.50 packs/day for 40 years    Types: Cigarettes    Start date: 11/17/2013  . Smokeless tobacco: Never Used   Comment: 3-4 cigarettes daily  . Alcohol Use: No  . Drug Use: No  . Sexual Activity: Not on file     Comment: lives with husband, no dietary restrictions.    Other Topics Concern  . Not on file   Social History Narrative    Past Surgical History  Procedure Laterality Date  . Appendectomy    . Bilateral salpingoophorectomy  04/06/04  . Abdominal hysterectomy  04/06/04  . Rotator cuff repair    . Mass excision Left 06/02/2015    Procedure: EXCISION LEFT ARM MASS;  Surgeon: Autumn Messing III, MD;  Location: [facility];  Service: General;  Laterality: Left;    Family History  Problem Relation Age of Onset  . Heart disease Mother   . Heart attack Mother   . Cancer Father     colon  . Hypertension Father   . Cancer Sister     brain  . Cancer Brother   . Hypertension Sister   . Cancer Sister     unsure of type  . Diabetes Sister     type 2  . Heart attack Sister   . Hypertension Brother   . Colon cancer Neg Hx     No Known Allergies  Current Outpatient Prescriptions on File Prior to Visit  Medication Sig Dispense Refill  . ALPRAZolam (XANAX) 0.5 MG tablet Take 1 tablet (0.5 mg total) by mouth 2 (  two) times daily as needed for anxiety. 60 tablet 0  . citalopram (CELEXA) 20 MG tablet TAKE 1 TABLET (20 MG TOTAL) BY MOUTH DAILY. 30 tablet 0  . cyclobenzaprine (FLEXERIL) 10 MG tablet Take 1 tablet (10 mg total) by mouth 3 (three) times daily as needed for muscle spasms. 90 tablet 1  . gabapentin (NEURONTIN) 600 MG tablet Take 1 tablet (600 mg total) by mouth 3 (three) times daily. Reported on 05/26/2015 90 tablet 2  . HYDROcodone-acetaminophen (NORCO/VICODIN) 5-325 MG tablet Take 1 tablet by mouth every 6 (six) hours as needed for moderate pain. 60 tablet 0  . ibuprofen (ADVIL,MOTRIN) 200 MG tablet Take 400 mg by mouth every 6 (six) hours as needed for mild pain or moderate pain. Reported on 07/27/2015    . levETIRAcetam (KEPPRA) 500 MG tablet TAKE 1 TABLET (500 MG TOTAL)  BY MOUTH TWO (2) TIMES A DAY.  3  . metoprolol tartrate (LOPRESSOR) 25 MG tablet Take 1 tablet (25 mg total) by mouth 2 (two) times daily. 60 tablet 5  . oxyCODONE-acetaminophen (ROXICET) 5-325 MG tablet Take 1-2 tablets by mouth every 4 (four) hours as needed. 30 tablet 0  . pantoprazole (PROTONIX) 40 MG tablet TAKE 1 TABLET (40 MG TOTAL) BY MOUTH DAILY. 30 tablet 11   No current facility-administered medications on file prior to visit.    BP 114/68 mmHg  Pulse 78  Temp(Src) 98 F (36.7 C) (Oral)  Ht 5\' 2"  (1.575 m)  Wt 105 lb (47.628 kg)  BMI 19.20 kg/m2  SpO2 98%       Objective:   Physical Exam  General- No acute distress. Pleasant patient. Neck- Full range of motion, no jvd Lungs- Clear, even and unlabored. Heart- regular rate and rhythm. Neurologic- CNII- XII grossly intact. Skin- left side of back. Beneath her scapula has 4 cm x 2 cm red area. Center has broken down area of dermis.(part of this may be related to husband procedure digging out tick head). Faint red but not warm area under axillary area(she states area itches)  Abdomen- soft non-tender, nondistended +bs, no rebound or guarding.       Assessment & Plan:  For your tick bite and recent symptoms will rx doxycycline antibiotic(rx advisement given).   Will get cbc and tick bite studies.  For nausea will rx zofran. Will also get cmp, amylase and lipase.  Follow up on Monday or as needed.(if symptoms worsen over weekend then ED evaluation)  We did give phenergan 25 mg in office today. About 8 hours later could start zofran.  Skylor Hughson, Percell Miller, PA-C

## 2015-07-28 NOTE — Progress Notes (Signed)
Pre visit review using our clinic review tool, if applicable. No additional management support is needed unless otherwise documented below in the visit note. 

## 2015-07-28 NOTE — Telephone Encounter (Signed)
Unable to leave message no answer. Phone rang over 20 times.

## 2015-07-29 LAB — LYME AB/WESTERN BLOT REFLEX: B burgdorferi Ab IgG+IgM: <0.9 Index (ref <0.90)

## 2015-08-01 ENCOUNTER — Ambulatory Visit (INDEPENDENT_AMBULATORY_CARE_PROVIDER_SITE_OTHER): Payer: 59 | Admitting: Medical

## 2015-08-01 ENCOUNTER — Encounter: Payer: Self-pay | Admitting: Medical

## 2015-08-01 VITALS — BP 110/70 | HR 68 | Temp 98.1°F | Ht 62.0 in | Wt 106.8 lb

## 2015-08-01 DIAGNOSIS — W57XXXA Bitten or stung by nonvenomous insect and other nonvenomous arthropods, initial encounter: Secondary | ICD-10-CM | POA: Diagnosis not present

## 2015-08-01 DIAGNOSIS — T148 Other injury of unspecified body region: Secondary | ICD-10-CM

## 2015-08-01 DIAGNOSIS — R591 Generalized enlarged lymph nodes: Secondary | ICD-10-CM

## 2015-08-01 DIAGNOSIS — L089 Local infection of the skin and subcutaneous tissue, unspecified: Secondary | ICD-10-CM | POA: Diagnosis not present

## 2015-08-01 MED ORDER — DOXYCYCLINE HYCLATE 100 MG PO TABS: 100.0000 mg | ORAL_TABLET | Freq: Two times a day (BID) | ORAL | Status: DC

## 2015-08-01 MED ORDER — HYDROXYZINE HCL 10 MG PO TABS: 10.0000 mg | ORAL_TABLET | Freq: Three times a day (TID) | ORAL | Status: DC | PRN

## 2015-08-01 NOTE — Patient Instructions (Signed)
Your tick bite area looks better. You do have a lymph node enlarged lt axillary area. I think that is related to tick bite and infection. Start the doxycycline. If area does not get better completely by 7 days  then may refer to dermatologist since you gave history of possible tearing tick off and head of tick may be buried. Also you small lymph node swelling needs to have improved by one week. If not let us know.  Nausea and vomiting are now resolved. You have some zofran tablets [name] if needed.  For skin itching I will rx hydroxyzine tablets.  Follow up in 7 days or as needed

## 2015-08-01 NOTE — Progress Notes (Signed)
Pre visit review using our clinic review tool, if applicable. No additional management support is needed unless otherwise documented below in the visit note. 

## 2015-08-01 NOTE — Progress Notes (Signed)
Subjective:    Patient ID: Ruth Bell, female    DOB: [birth year], 55 y.o.   MRN: [id number]  HPI  Pt is in for follow on her tick bite. Pt has started the doxycycline. Large area of likely infection following the tick bite(see last note). Pt no longer has fever or fatigue.  Pt also states her nausea and vomiting got better on Saturday. Pt reports she got better with zofran.   Pt has some itching around the tick bite site.   Pt has small lymph node in area of left axillary area that she can feel since last visit.(note has had recent mammogram and negative per her report)       Review of Systems  Constitutional: Negative for chills and fatigue.  Respiratory: Negative for cough and chest tightness.   Cardiovascular: Negative for chest pain and palpitations.  Gastrointestinal: Negative for abdominal pain.  Musculoskeletal: Negative for back pain.  Skin: Positive for rash.       Itching.  Hematological: Positive for adenopathy. Does not bruise/bleed easily.  Psychiatric/Behavioral: Negative for behavioral problems and confusion.    Past Medical History  Diagnosis Date  . Migraine, unspecified, without mention of intractable migraine without mention of status migrainosus   . Anemia   . Menometrorrhagia 2006  . Uterine leiomyoma 2006  . Depression   . Hypertension   . Tubular adenoma of colon 05/2011  . Constipation 12/27/2013  . Arm skin lesion, left 09/05/2014  . Hyperglycemia 01/15/2015  . Anxiety   . GERD (gastroesophageal reflux disease)   . Seizures ([place])     history of pseudoseizure disorder, last last seizure 3 wks, keppra inc in dosage     Social History   Social History  . Marital Status: Married    Spouse Name: Gwyndolyn Saxon  . Number of Children: N/A  . Years of Education: N/A   Occupational History  . CMA    Social History Main Topics  . Smoking status: Current Every Day Smoker -- 0.50 packs/day for 40 years    Types: Cigarettes    Start date:  11/17/2013  . Smokeless tobacco: Never Used     Comment: 3-4 cigarettes daily  . Alcohol Use: No  . Drug Use: No  . Sexual Activity: Not on file     Comment: lives with husband, no dietary restrictions.    Other Topics Concern  . Not on file   Social History Narrative    Past Surgical History  Procedure Laterality Date  . Appendectomy    . Bilateral salpingoophorectomy  04/06/04  . Abdominal hysterectomy  04/06/04  . Rotator cuff repair    . Mass excision Left 06/02/2015    Procedure: EXCISION LEFT ARM MASS;  Surgeon: Autumn Messing III, MD;  Location: [facility];  Service: General;  Laterality: Left;    Family History  Problem Relation Age of Onset  . Heart disease Mother   . Heart attack Mother   . Cancer Father     colon  . Hypertension Father   . Cancer Sister     brain  . Cancer Brother   . Hypertension Sister   . Cancer Sister     unsure of type  . Diabetes Sister     type 2  . Heart attack Sister   . Hypertension Brother   . Colon cancer Neg Hx     No Known Allergies  Current Outpatient Prescriptions on File Prior to Visit  Medication Sig Dispense  Refill  . ALPRAZolam (XANAX) 0.5 MG tablet Take 1 tablet (0.5 mg total) by mouth 2 (two) times daily as needed for anxiety. 60 tablet 0  . citalopram (CELEXA) 20 MG tablet TAKE 1 TABLET (20 MG TOTAL) BY MOUTH DAILY. 30 tablet 0  . cyclobenzaprine (FLEXERIL) 10 MG tablet Take 1 tablet (10 mg total) by mouth 3 (three) times daily as needed for muscle spasms. 90 tablet 1  . gabapentin (NEURONTIN) 600 MG tablet Take 1 tablet (600 mg total) by mouth 3 (three) times daily. Reported on 05/26/2015 90 tablet 2  . HYDROcodone-acetaminophen (NORCO/VICODIN) 5-325 MG tablet Take 1 tablet by mouth every 6 (six) hours as needed for moderate pain. 60 tablet 0  . ibuprofen (ADVIL,MOTRIN) 200 MG tablet Take 400 mg by mouth every 6 (six) hours as needed for mild pain or moderate pain. Reported on 07/27/2015    . levETIRAcetam  (KEPPRA) 500 MG tablet TAKE 1 TABLET (500 MG TOTAL) BY MOUTH TWO (2) TIMES A DAY.  3  . metoprolol tartrate (LOPRESSOR) 25 MG tablet Take 1 tablet (25 mg total) by mouth 2 (two) times daily. 60 tablet 5  . ondansetron (ZOFRAN ODT) 8 MG disintegrating tablet Take 1 tablet (8 mg total) by mouth every 8 (eight) hours as needed for nausea or vomiting. 20 tablet 0  . oxyCODONE-acetaminophen (ROXICET) 5-325 MG tablet Take 1-2 tablets by mouth every 4 (four) hours as needed. 30 tablet 0  . pantoprazole (PROTONIX) 40 MG tablet TAKE 1 TABLET (40 MG TOTAL) BY MOUTH DAILY. 30 tablet 11   No current facility-administered medications on file prior to visit.    BP 110/70 mmHg  Pulse 68  Temp(Src) 98.1 F (36.7 C) (Oral)  Ht 5\' 2"  (1.575 m)  Wt 106 lb 12.8 oz (48.444 kg)  BMI 19.53 kg/m2  SpO2 99%       Objective:   Physical Exam  General- No acute distress. Pleasant patient. Neck- Full range of motion, no jvd Lungs- Clear, even and unlabored. Heart- regular rate and rhythm. Neurologic- CNII- XII grossly intact. Skin- left side of back. Beneath her scapula has 2 cm x 1 cm red area. Center area that was broken down now filled in). Faint red but not warm area under axillary area.  Lt axillary area- one small palpable lymph node.      Assessment & Plan:  Your tick bite area looks better. You do have a lymph node enlarged lt axillary area. I think that is related to tick bite and infection. Start the doxycycline. If area does not get better completely by 7 days  then may refer to dermatologist since you gave history of possible tearing tick off and head of tick may be buried. Also you small lymph node swelling needs to have improved by one week. If not let us know.  Nausea and vomiting are now resolved. You have some zofran tablets [name] if needed.  For skin itching I will rx hydroxyzine tablets.  Follow up in 7 days or as needed

## 2015-08-02 LAB — ROCKY MTN SPOTTED FVR ABS PNL(IGG+IGM)
RMSF IgG: NOT DETECTED
RMSF IgM: NOT DETECTED

## 2015-08-05 ENCOUNTER — Encounter: Payer: Self-pay | Admitting: Gastroenterology

## 2015-08-08 ENCOUNTER — Encounter: Payer: Self-pay | Admitting: Medical

## 2015-08-08 ENCOUNTER — Ambulatory Visit (INDEPENDENT_AMBULATORY_CARE_PROVIDER_SITE_OTHER): Payer: 59 | Admitting: Medical

## 2015-08-08 VITALS — BP 116/78 | HR 55 | Temp 98.1°F | Ht 62.0 in | Wt 105.6 lb

## 2015-08-08 DIAGNOSIS — L089 Local infection of the skin and subcutaneous tissue, unspecified: Secondary | ICD-10-CM

## 2015-08-08 DIAGNOSIS — W57XXXA Bitten or stung by nonvenomous insect and other nonvenomous arthropods, initial encounter: Secondary | ICD-10-CM

## 2015-08-08 DIAGNOSIS — T148 Other injury of unspecified body region: Secondary | ICD-10-CM

## 2015-08-08 NOTE — Patient Instructions (Signed)
Your tick bite region has decreased in size since last visit but still not completely better. Also the lymph node in axillary region may be related. We will see how you do with the remaining 3 days of oral antibiotics. Call me on Thursday if the area is not completley  healed and if lymph node still enlarged left axillary area then will need to refer you to dermatologist or general  surgeon. My concern at that point would be retained head of tick that you pulled off may be present.

## 2015-08-08 NOTE — Progress Notes (Signed)
Subjective:    Patient ID: Ruth Bell, female    DOB: 16-Dec-1960, 55 y.o.   MRN: [id number]  HPI  Pt is 7 days into into doxy for skin infection.   The area where tick bit her is still faintly tender and faint indurated. Still itches. She still has small lymph node raised in her left axillary area. Pt feels some fever this weekend.(low grade subjective then)  Pt tick bite studies were negative.  Pt states vomiting has resolved.  Review of Systems  Constitutional: Positive for fever. Negative for chills and fatigue.  Respiratory: Negative for cough, chest tightness and shortness of breath.   Cardiovascular: Negative for chest pain and palpitations.  Musculoskeletal: Negative for back pain.  Neurological: Positive for dizziness.       Rare transient and intermittent. Not associated with neurologic signs or symptoms.  Hematological: Positive for adenopathy.  Psychiatric/Behavioral: Negative for behavioral problems and confusion.    Past Medical History  Diagnosis Date  . Migraine, unspecified, without mention of intractable migraine without mention of status migrainosus   . Anemia   . Menometrorrhagia 2006  . Uterine leiomyoma 2006  . Depression   . Hypertension   . Tubular adenoma of colon 05/2011  . Constipation 12/27/2013  . Arm skin lesion, left 09/05/2014  . Hyperglycemia 01/15/2015  . Anxiety   . GERD (gastroesophageal reflux disease)   . Seizures ([place])     history of pseudoseizure disorder, last last seizure 3 wks, keppra inc in dosage     Social History   Social History  . Marital Status: Married    Spouse Name: Gwyndolyn Saxon  . Number of Children: N/A  . Years of Education: N/A   Occupational History  . CMA    Social History Main Topics  . Smoking status: Current Every Day Smoker -- 0.50 packs/day for 40 years    Types: Cigarettes    Start date: 11/17/2013  . Smokeless tobacco: Never Used     Comment: 3-4 cigarettes daily  . Alcohol Use: No  . Drug Use:  No  . Sexual Activity: Not on file     Comment: lives with husband, no dietary restrictions.    Other Topics Concern  . Not on file   Social History Narrative    Past Surgical History  Procedure Laterality Date  . Appendectomy    . Bilateral salpingoophorectomy  04/06/04  . Abdominal hysterectomy  04/06/04  . Rotator cuff repair    . Mass excision Left 06/02/2015    Procedure: EXCISION LEFT ARM MASS;  Surgeon: Autumn Messing III, MD;  Location: [facility];  Service: General;  Laterality: Left;    Family History  Problem Relation Age of Onset  . Heart disease Mother   . Heart attack Mother   . Cancer Father     colon  . Hypertension Father   . Cancer Sister     brain  . Cancer Brother   . Hypertension Sister   . Cancer Sister     unsure of type  . Diabetes Sister     type 2  . Heart attack Sister   . Hypertension Brother   . Colon cancer Neg Hx     No Known Allergies  Current Outpatient Prescriptions on File Prior to Visit  Medication Sig Dispense Refill  . ALPRAZolam (XANAX) 0.5 MG tablet Take 1 tablet (0.5 mg total) by mouth 2 (two) times daily as needed for anxiety. 60 tablet 0  .  citalopram (CELEXA) 20 MG tablet TAKE 1 TABLET (20 MG TOTAL) BY MOUTH DAILY. 30 tablet 0  . cyclobenzaprine (FLEXERIL) 10 MG tablet Take 1 tablet (10 mg total) by mouth 3 (three) times daily as needed for muscle spasms. 90 tablet 1  . doxycycline (VIBRA-TABS) 100 MG tablet Take 1 tablet (100 mg total) by mouth 2 (two) times daily. Can give caps if tabs are too expensive. Generic ok. 20 tablet 0  . gabapentin (NEURONTIN) 600 MG tablet Take 1 tablet (600 mg total) by mouth 3 (three) times daily. Reported on 05/26/2015 90 tablet 2  . HYDROcodone-acetaminophen (NORCO/VICODIN) 5-325 MG tablet Take 1 tablet by mouth every 6 (six) hours as needed for moderate pain. 60 tablet 0  . hydrOXYzine (ATARAX/VISTARIL) 10 MG tablet Take 1 tablet (10 mg total) by mouth every 8 (eight) hours as needed  for itching. 21 tablet 0  . ibuprofen (ADVIL,MOTRIN) 200 MG tablet Take 400 mg by mouth every 6 (six) hours as needed for mild pain or moderate pain. Reported on 07/27/2015    . levETIRAcetam (KEPPRA) 500 MG tablet TAKE 1 TABLET (500 MG TOTAL) BY MOUTH TWO (2) TIMES A DAY.  3  . metoprolol tartrate (LOPRESSOR) 25 MG tablet Take 1 tablet (25 mg total) by mouth 2 (two) times daily. 60 tablet 5  . ondansetron (ZOFRAN ODT) 8 MG disintegrating tablet Take 1 tablet (8 mg total) by mouth every 8 (eight) hours as needed for nausea or vomiting. 20 tablet 0  . oxyCODONE-acetaminophen (ROXICET) 5-325 MG tablet Take 1-2 tablets by mouth every 4 (four) hours as needed. 30 tablet 0  . pantoprazole (PROTONIX) 40 MG tablet TAKE 1 TABLET (40 MG TOTAL) BY MOUTH DAILY. 30 tablet 11   No current facility-administered medications on file prior to visit.    BP 116/78 mmHg  Pulse 55  Temp(Src) 98.1 F (36.7 C) (Oral)  Ht 5\' 2"  (1.575 m)  Wt 105 lb 9.6 oz (47.9 kg)  BMI 19.31 kg/m2  SpO2 98%       Objective:   Physical Exam  General- No acute distress. Pleasant patient. Neck- Full range of motion, no jvd Lungs- Clear, even and unlabored. Heart- regular rate and rhythm. Neurologic- CNII- XII grossly intact. Skin- left side of back. Beneath her scapula has 1 cm x 1 cm red area. Center area that was broken down now filled in). Faint red but not warm area under axillary area.  Lt axillary area- one small palpable lymph node.      Assessment & Plan:  Your tick bite region has decreased in size since last visit but still not completely better. Also the lymph node in axillary region may be related. We will see how you do with the remaining 3 days of oral antibiotics. Call me on Thursday if the area is not completley  healed and if lymph node still enlarged left axillary area then will need to refer you to dermatologist or general  surgeon. My concern at that point would be retained head of tick that you pulled  off may be present.  Brysyn Brandenberger, Percell Miller, PA-C

## 2015-08-08 NOTE — Progress Notes (Signed)
Pre visit review using our clinic review tool, if applicable. No additional management support is needed unless otherwise documented below in the visit note. 

## 2015-08-11 ENCOUNTER — Ambulatory Visit (INDEPENDENT_AMBULATORY_CARE_PROVIDER_SITE_OTHER): Payer: 59 | Admitting: Medical

## 2015-08-11 ENCOUNTER — Encounter: Payer: Self-pay | Admitting: Medical

## 2015-08-11 VITALS — BP 110/64 | HR 69 | Temp 98.0°F | Ht 62.0 in | Wt 105.4 lb

## 2015-08-11 DIAGNOSIS — R1013 Epigastric pain: Secondary | ICD-10-CM

## 2015-08-11 DIAGNOSIS — R8290 Unspecified abnormal findings in urine: Secondary | ICD-10-CM | POA: Diagnosis not present

## 2015-08-11 DIAGNOSIS — L089 Local infection of the skin and subcutaneous tissue, unspecified: Secondary | ICD-10-CM | POA: Diagnosis not present

## 2015-08-11 DIAGNOSIS — T148 Other injury of unspecified body region: Secondary | ICD-10-CM | POA: Diagnosis not present

## 2015-08-11 DIAGNOSIS — B029 Zoster without complications: Secondary | ICD-10-CM | POA: Diagnosis not present

## 2015-08-11 DIAGNOSIS — R112 Nausea with vomiting, unspecified: Secondary | ICD-10-CM | POA: Diagnosis not present

## 2015-08-11 DIAGNOSIS — W57XXXA Bitten or stung by nonvenomous insect and other nonvenomous arthropods, initial encounter: Secondary | ICD-10-CM

## 2015-08-11 DIAGNOSIS — R319 Hematuria, unspecified: Secondary | ICD-10-CM

## 2015-08-11 LAB — POC URINALSYSI DIPSTICK (AUTOMATED)
Bilirubin, UA: NEGATIVE
Glucose, UA: NEGATIVE
Ketones, UA: NEGATIVE
Leukocytes, UA: NEGATIVE
Nitrite, UA: NEGATIVE
Protein, UA: NEGATIVE
Spec Grav, UA: 1.015
Urobilinogen, UA: 0.2
pH, UA: 6

## 2015-08-11 MED ORDER — CEPHALEXIN 500 MG PO CAPS: 500.0000 mg | ORAL_CAPSULE | Freq: Two times a day (BID) | ORAL | Status: DC

## 2015-08-11 MED ORDER — FAMCICLOVIR 500 MG PO TABS: 500.0000 mg | ORAL_TABLET | Freq: Three times a day (TID) | ORAL | Status: DC

## 2015-08-11 MED ORDER — ONDANSETRON 8 MG PO TBDP: 8.0000 mg | ORAL_TABLET | Freq: Three times a day (TID) | ORAL | Status: DC | PRN

## 2015-08-11 MED ORDER — CEFTRIAXONE SODIUM 1 G IJ SOLR
1.0000 g | Freq: Once | INTRAMUSCULAR | Status: AC
  Administered 2015-08-11: 1 g via INTRAMUSCULAR

## 2015-08-11 MED ORDER — RANITIDINE HCL 150 MG PO TABS: 150.0000 mg | ORAL_TABLET | Freq: Two times a day (BID) | ORAL | Status: DC

## 2015-08-11 NOTE — Patient Instructions (Addendum)
For tick bite possible retained head and persisting irritation with possible infection refer to surgeon. Keep phone on tomorrow am trying to get you in before long holiday weekend.  For vomiting and abd pain. Will get cmp, cbc, amylase and lipase.  You abdomen pain ranitidine. For vomiting rx zofran.(bland foods and hydrate well).  You may have uti. 1 gram rocephin im today, Rx keflex(keflex can cover urine infection and some skin infections.  For shingles outbreak early rx famvir.  If syptoms worsen or change after hours then ED evaluation.  Follow up in 7 days or as needed

## 2015-08-11 NOTE — Progress Notes (Signed)
   Subjective:    Patient ID: Raquel Sarna, female    DOB: 1960-05-11, 55 y.o.   MRN: [id number]  HPI   Pt still has tick bite area. The skin feels tight/hard to her.  Pt lymph node did go down recently. No longer palpable in left axillary area.   Pt reminds me she did pull off tick from area. Her husband actually pulled the tick off. Pt rmfs studies and lyme studies looked negative on lab review.  Pt noticed crops of bumps medial to tick bite. Just this morning.(this is new)  Pt was in on last visit and plan was to refer in event she had persisting non healing wound from tick bite.  Pt states just yesterday started feeling. She vomited 5 times yesterday. Green looking vomit. Pt states after she eats she is vomiting. No diarrhea. Pt finished antibiotic yesterday. Vomited some today twice. Her stools don't look black or bloody.  Also 3 days ago pt states her urine started to smell bad. No pain on urination. Some left flank pain/cva area.    Review of Systems  Constitutional: Positive for chills and fatigue. Negative for fever.  HENT: Positive for sinus pressure. Negative for congestion, ear pain, facial swelling, nosebleeds and postnasal drip.        Faint left maxillary sinus pressure  Gastrointestinal: Positive for nausea, vomiting and abdominal pain. Negative for diarrhea, constipation, blood in stool, abdominal distention, anal bleeding and rectal pain.  Genitourinary: Negative for dysuria, hematuria, flank pain, decreased urine volume and genital sores.       Odor to urine.  Musculoskeletal: Positive for back pain.  Skin: Positive for rash.  Neurological: Negative for dizziness.  Psychiatric/Behavioral: Negative for hallucinations, confusion, dysphoric mood, decreased concentration and agitation.       Objective:   Physical Exam   General- No acute distress. Pleasant patient. Neck- Full range of motion, no jvd Lungs- Clear, even and unlabored. Heart- regular rate and  rhythm. Neurologic- CNII- XII grossly intact. Skin- left side of back. Beneath her scapula has 1 cm x 1 cm red area. Center area that was broken down now filled in). Faint red but not warm area under axillary area.  Lt axillary area- area is clear now. Skin- scattered almost faint vesicle rash to left rib area. Below axilla.      Assessment & Plan:  For tick bite possible retained head and persisting irritation with possible infection refer to surgeon. Keep phone on tomorrow am trying to get you in before long holiday weekend.  For vomiting and abd pain. Will get cmp, cbc, amylase and lipase.  You abdomen pain ranitidine. For vomiting rx zofran.(bland foods and hydrate well).  You may have uti. 1 gram rocephin im today, Rx keflex(keflex can cover urine infection and some skin infections.  For shingles outbreak early rx famvir.  If syptoms worsen or change after hours then ED evaluation.  Follow up in 7 days or as needed  Julen Rubert, Percell Miller, [employer]

## 2015-08-11 NOTE — Addendum Note (Signed)
Addended by: Tasia Catchings on: 08/11/2015 06:36 PM   Modules accepted: Orders

## 2015-08-11 NOTE — Addendum Note (Signed)
Addended by: Tasia Catchings on: 08/11/2015 06:47 PM   Modules accepted: Orders

## 2015-08-11 NOTE — Progress Notes (Signed)
Pre visit review using our clinic review tool, if applicable. No additional management support is needed unless otherwise documented below in the visit note. 

## 2015-08-12 ENCOUNTER — Emergency Department (HOSPITAL_BASED_OUTPATIENT_CLINIC_OR_DEPARTMENT_OTHER): Payer: 59

## 2015-08-12 ENCOUNTER — Emergency Department (HOSPITAL_BASED_OUTPATIENT_CLINIC_OR_DEPARTMENT_OTHER)
Admission: EM | Admit: 2015-08-12 | Discharge: 2015-08-12 | Disposition: A | Discharge status: Home / Self Care | Payer: 59 | Attending: Emergency Medicine | Admitting: Emergency Medicine

## 2015-08-12 ENCOUNTER — Other Ambulatory Visit: Payer: 59

## 2015-08-12 ENCOUNTER — Encounter (HOSPITAL_BASED_OUTPATIENT_CLINIC_OR_DEPARTMENT_OTHER): Payer: Self-pay | Admitting: Emergency Medicine

## 2015-08-12 DIAGNOSIS — F329 Major depressive disorder, single episode, unspecified: Secondary | ICD-10-CM | POA: Insufficient documentation

## 2015-08-12 DIAGNOSIS — Y929 Unspecified place or not applicable: Secondary | ICD-10-CM | POA: Insufficient documentation

## 2015-08-12 DIAGNOSIS — E860 Dehydration: Secondary | ICD-10-CM | POA: Insufficient documentation

## 2015-08-12 DIAGNOSIS — S20462A Insect bite (nonvenomous) of left back wall of thorax, initial encounter: Secondary | ICD-10-CM | POA: Diagnosis present

## 2015-08-12 DIAGNOSIS — Y999 Unspecified external cause status: Secondary | ICD-10-CM | POA: Insufficient documentation

## 2015-08-12 DIAGNOSIS — Z79899 Other long term (current) drug therapy: Secondary | ICD-10-CM | POA: Insufficient documentation

## 2015-08-12 DIAGNOSIS — R300 Dysuria: Secondary | ICD-10-CM | POA: Diagnosis not present

## 2015-08-12 DIAGNOSIS — Z792 Long term (current) use of antibiotics: Secondary | ICD-10-CM | POA: Diagnosis not present

## 2015-08-12 DIAGNOSIS — I10 Essential (primary) hypertension: Secondary | ICD-10-CM | POA: Insufficient documentation

## 2015-08-12 DIAGNOSIS — Z79891 Long term (current) use of opiate analgesic: Secondary | ICD-10-CM | POA: Insufficient documentation

## 2015-08-12 DIAGNOSIS — Y939 Activity, unspecified: Secondary | ICD-10-CM | POA: Diagnosis not present

## 2015-08-12 DIAGNOSIS — R55 Syncope and collapse: Secondary | ICD-10-CM | POA: Insufficient documentation

## 2015-08-12 DIAGNOSIS — F17210 Nicotine dependence, cigarettes, uncomplicated: Secondary | ICD-10-CM | POA: Diagnosis not present

## 2015-08-12 DIAGNOSIS — W57XXXA Bitten or stung by nonvenomous insect and other nonvenomous arthropods, initial encounter: Secondary | ICD-10-CM | POA: Insufficient documentation

## 2015-08-12 LAB — CBC WITH DIFFERENTIAL/PLATELET
Basophils Absolute: 0 10*3/uL (ref 0.0–0.1)
Basophils Relative: 0 %
Eosinophils Absolute: 0.2 10*3/uL (ref 0.0–0.7)
Eosinophils Relative: 2 %
HCT: 42.1 % (ref 36.0–46.0)
Hemoglobin: 14.3 g/dL (ref 12.0–15.0)
Lymphocytes Relative: 25 %
Lymphs Abs: 1.9 10*3/uL (ref 0.7–4.0)
MCH: 28.2 pg (ref 26.0–34.0)
MCHC: 34 g/dL (ref 30.0–36.0)
MCV: 83 fL (ref 78.0–100.0)
Monocytes Absolute: 0.4 10*3/uL (ref 0.1–1.0)
Monocytes Relative: 5 %
Neutro Abs: 5.1 10*3/uL (ref 1.7–7.7)
Neutrophils Relative %: 68 %
Platelets: 320 10*3/uL (ref 150–400)
RBC: 5.07 MIL/uL (ref 3.87–5.11)
RDW: 14.3 % (ref 11.5–15.5)
WBC: 7.5 10*3/uL (ref 4.0–10.5)

## 2015-08-12 LAB — URINALYSIS, ROUTINE W REFLEX MICROSCOPIC
Bilirubin Urine: NEGATIVE
Glucose, UA: NEGATIVE mg/dL
Ketones, ur: NEGATIVE mg/dL
Leukocytes, UA: NEGATIVE
Nitrite: NEGATIVE
Protein, ur: NEGATIVE mg/dL
Specific Gravity, Urine: 1.002 — ABNORMAL LOW (ref 1.005–1.030)
pH: 7 (ref 5.0–8.0)

## 2015-08-12 LAB — COMPREHENSIVE METABOLIC PANEL
ALT: 14 U/L (ref 14–54)
AST: 20 U/L (ref 15–41)
Albumin: 4.5 g/dL (ref 3.5–5.0)
Alkaline Phosphatase: 58 U/L (ref 38–126)
Anion gap: 9 (ref 5–15)
BUN: 10 mg/dL (ref 6–20)
CO2: 25 mmol/L (ref 22–32)
Calcium: 9.6 mg/dL (ref 8.9–10.3)
Chloride: 105 mmol/L (ref 101–111)
Creatinine, Ser: 0.59 mg/dL (ref 0.44–1.00)
GFR calc Af Amer: >60 mL/min (ref >60)
GFR calc non Af Amer: >60 mL/min (ref >60)
Glucose, Bld: 90 mg/dL (ref 65–99)
Potassium: 3.9 mmol/L (ref 3.5–5.1)
Sodium: 139 mmol/L (ref 135–145)
Total Bilirubin: 0.6 mg/dL (ref 0.3–1.2)
Total Protein: 8.2 g/dL — ABNORMAL HIGH (ref 6.5–8.1)

## 2015-08-12 LAB — URINE MICROSCOPIC-ADD ON
Bacteria, UA: NONE SEEN
WBC, UA: NONE SEEN WBC/hpf (ref 0–5)

## 2015-08-12 MED ORDER — METOCLOPRAMIDE HCL 5 MG/ML IJ SOLN
10.0000 mg | Freq: Once | INTRAMUSCULAR | Status: AC
  Administered 2015-08-12: 10 mg via INTRAVENOUS

## 2015-08-12 MED ORDER — SODIUM CHLORIDE 0.9 % IV BOLUS (SEPSIS)
2000.0000 mL | Freq: Once | INTRAVENOUS | Status: AC
  Administered 2015-08-12: 2000 mL via INTRAVENOUS

## 2015-08-12 MED ORDER — METOCLOPRAMIDE HCL 10 MG PO TABS: 10.0000 mg | ORAL_TABLET | Freq: Three times a day (TID) | ORAL | Status: DC | PRN

## 2015-08-12 MED ORDER — METOCLOPRAMIDE HCL 5 MG/ML IJ SOLN
INTRAMUSCULAR | Status: AC
  Filled 2015-08-12: qty 2

## 2015-08-12 MED ORDER — ONDANSETRON HCL 4 MG/2ML IJ SOLN
4.0000 mg | Freq: Once | INTRAMUSCULAR | Status: AC
  Administered 2015-08-12: 4 mg via INTRAVENOUS
  Filled 2015-08-12: qty 2

## 2015-08-12 MED FILL — METOCLOPRAMIDE 10 MG TABLET: 10 | 4 days supply | Qty: 12 | Fill #0

## 2015-08-12 NOTE — Addendum Note (Signed)
Addended by: Tasia Catchings on: 08/12/2015 03:45 PM   Modules accepted: Orders

## 2015-08-12 NOTE — ED Notes (Signed)
Pt had syncopal event after starting IV line, pt husband states that this is normal for her and this happens randomly at any time.  Pt husband does not think that this is seizure activity based off of her past seizure history. Requested ammonia inhalant from nurse outside of room and husband stated that she did not need it because it would startle her.  Before waking, pt husband stated "just give it time, she will wake up here in a minute Pt woke up after appx 1 minute. Dr. Winfred Leeds notified. Pt alert and oriented at this time, bedrails are up in position.

## 2015-08-12 NOTE — ED Notes (Signed)
Pt reports tick bite on left upper back x3 weeks, tick was removed by pt and went to PCP and was given doxycycline. Pt is still taking this medication.  Pt husband reports that a day after removing the tick, he noticed that the head was still implanted into pt skin and removed it with tweezers.  Pt has had n/v for past 2 weeks and feels weak.  Pt alert and oriented.

## 2015-08-12 NOTE — ED Notes (Signed)
Pt tolerating gingerale at this time.

## 2015-08-12 NOTE — ED Notes (Signed)
Pt placed on bedpan to urinate, pt urinated and bedpan removed, skin intact, pt performed self peri-care

## 2015-08-12 NOTE — ED Notes (Signed)
Pt not tolerating fluids, pt had episode of emesis, MD notified.

## 2015-08-12 NOTE — ED Notes (Signed)
MD at bedside discussing event with family and pt, pt placed on cardiac monitor.

## 2015-08-12 NOTE — Discharge Instructions (Signed)
Dehydration, Adult Take the medication prescribed as needed for nausea. You can also take the Zofran prescribed to you earlier. Stay on clear liquids for the next 12 hours such as Jell-O, broth, juice, water or Gatorade. Make sure that you drink at least six 8 ounce glasses daily. Keep your scheduled appointment with Dr.Blythe next week. Return if unable to hold down fluids after taking the medications prescribed or if you feel worse for any reason. Dehydration is a condition in which you do not have enough fluid or water in your body. It happens when you take in less fluid than you lose. Vital organs such as the kidneys, brain, and heart cannot function without a proper amount of fluids. Any loss of fluids from the body can cause dehydration.  Dehydration can range from mild to severe. This condition should be treated right away to help prevent it from becoming severe. CAUSES  This condition may be caused by:  Vomiting.  Diarrhea.  Excessive sweating, such as when exercising in hot or humid weather.  Not drinking enough fluid during strenuous exercise or during an illness.  Excessive urine output.  Fever.  Certain medicines. RISK FACTORS This condition is more likely to develop in:  People who are taking certain medicines that cause the body to lose excess fluid (diuretics).   People who have a chronic illness, such as diabetes, that may increase urination.  Older adults.   People who live at high altitudes.   People who participate in endurance sports.  SYMPTOMS  Mild Dehydration  Thirst.  Dry lips.  Slightly dry mouth.  Dry, warm skin. Moderate Dehydration  Very dry mouth.   Muscle cramps.   Dark urine and decreased urine production.   Decreased tear production.   Headache.   Light-headedness, especially when you stand up from a sitting position.  Severe Dehydration  Changes in skin.   Cold and clammy skin.   Skin does not spring back  quickly when lightly pinched and released.   Changes in body fluids.   Extreme thirst.   No tears.   Not able to sweat when body temperature is high, such as in hot weather.   Minimal urine production.   Changes in vital signs.   Rapid, weak pulse (more than 100 beats per minute when you are sitting still).   Rapid breathing.   Low blood pressure.   Other changes.   Sunken eyes.   Cold hands and feet.   Confusion.  Lethargy and difficulty being awakened.  Fainting (syncope).   Short-term weight loss.   Unconsciousness. DIAGNOSIS  This condition may be diagnosed based on your symptoms. You may also have tests to determine how severe your dehydration is. These tests may include:   Urine tests.   Blood tests.  TREATMENT  Treatment for this condition depends on the severity. Mild or moderate dehydration can often be treated at home. Treatment should be started right away. Do not wait until dehydration becomes severe. Severe dehydration needs to be treated at the hospital. Treatment for Mild Dehydration  Drinking plenty of water to replace the fluid you have lost.   Replacing minerals in your blood (electrolytes) that you may have lost.  Treatment for Moderate Dehydration  Consuming oral rehydration solution (ORS). Treatment for Severe Dehydration  Receiving fluid through an IV tube.   Receiving electrolyte solution through a feeding tube that is passed through your nose and into your stomach (nasogastric tube or NG tube).  Correcting any abnormalities in  electrolytes. HOME CARE INSTRUCTIONS   Drink enough fluid to keep your urine clear or pale yellow.   Drink water or fluid slowly by taking small sips. You can also try sucking on ice cubes.  Have food or beverages that contain electrolytes. Examples include bananas and sports drinks.  Take over-the-counter and prescription medicines only as told by your health care provider.    Prepare ORS according to the manufacturer's instructions. Take sips of ORS every 5 minutes until your urine returns to normal.  If you have vomiting or diarrhea, continue to try to drink water, ORS, or both.   If you have diarrhea, avoid:   Beverages that contain caffeine.   Fruit juice.   Milk.   Carbonated soft drinks.  Do not take salt tablets. This can lead to the condition of having too much sodium in your body (hypernatremia).  SEEK MEDICAL CARE IF:  You cannot eat or drink without vomiting.  You have had moderate diarrhea during a period of more than 24 hours.  You have a fever. SEEK IMMEDIATE MEDICAL CARE IF:   You have extreme thirst.  You have severe diarrhea.  You have not urinated in 6-8 hours, or you have urinated only a small amount of very dark urine.  You have shriveled skin.  You are dizzy, confused, or both.   This information is not intended to replace advice given to you by your health care provider. Make sure you discuss any questions you have with your health care provider.   Document Released: 03/05/2005 Document Revised: 11/24/2014 Document Reviewed: 07/21/2014 Elsevier Interactive Patient Education [employer].

## 2015-08-12 NOTE — ED Provider Notes (Signed)
CSN: [id number]     Arrival date & time 08/12/15  [id number] History   First MD Initiated Contact with Patient 08/12/15 [phone]     Chief Complaint  Patient presents with  . Insect Bite     (Consider location/radiation/quality/duration/timing/severity/associated sxs/prior Treatment) HPI Patient complains of painful rash left upper back for the past 2 weeks. She has felt generally weak with vomiting for the past 3 days. She vomited 3 times today. She is not presently nauseated. She's been treated with Zofran with partial relief. Today she feels lightheaded. She denies abdominal pain. She feels presently hungry she is not presently nauseated other associated symptoms include burning with urination. She has been on doxycycline for the past 1.5 weeks since tick bite and also received Rocephin 1 g at the office yesterday with prescription for Keflex. She denies fever denies abdominal pain denies headache other associated symptoms include burning with urination at urethral meatus. No other associated symptoms. Patient's husband reports that he pulled the tick off of the patient's back 2 weeks ago, noticed that the head was still embedded in her skin the following day and pulled the head out the following day. He states the rash looks markedly improved over a few days ago. Past Medical History  Diagnosis Date  . Migraine, unspecified, without mention of intractable migraine without mention of status migrainosus   . Anemia   . Menometrorrhagia 2006  . Uterine leiomyoma 2006  . Depression   . Hypertension   . Tubular adenoma of colon 05/2011  . Constipation 12/27/2013  . Arm skin lesion, left 09/05/2014  . Hyperglycemia 01/15/2015  . Anxiety   . GERD (gastroesophageal reflux disease)   . Seizures ([place])     history of pseudoseizure disorder, last last seizure 3 wks, keppra inc in dosage   Past Surgical History  Procedure Laterality Date  . Appendectomy    . Bilateral salpingoophorectomy  04/06/04  .  Abdominal hysterectomy  04/06/04  . Rotator cuff repair    . Mass excision Left 06/02/2015    Procedure: EXCISION LEFT ARM MASS;  Surgeon: Autumn Messing III, MD;  Location: [place];  Service: General;  Laterality: Left;   Family History  Problem Relation Age of Onset  . Heart disease Mother   . Heart attack Mother   . Cancer Father     colon  . Hypertension Father   . Cancer Sister     brain  . Cancer Brother   . Hypertension Sister   . Cancer Sister     unsure of type  . Diabetes Sister     type 2  . Heart attack Sister   . Hypertension Brother   . Colon cancer Neg Hx    Social History  Substance Use Topics  . Smoking status: Current Every Day Smoker -- 0.50 packs/day for 40 years    Types: Cigarettes    Start date: 11/17/2013  . Smokeless tobacco: Never Used     Comment: 3-4 cigarettes daily  . Alcohol Use: No   OB History    No data available     Review of Systems  HENT: Negative.   Respiratory: Positive for cough.   Cardiovascular: Negative.   Gastrointestinal: Positive for vomiting.  Genitourinary: Positive for dysuria.  Musculoskeletal: Negative.   Skin: Positive for rash.  Neurological: Positive for weakness.  Psychiatric/Behavioral: Negative.   All other systems reviewed and are negative.     Allergies  Review of patient's allergies indicates no known  allergies.  Home Medications   Prior to Admission medications   Medication Sig Start Date End Date Taking? Authorizing Provider  ALPRAZolam Duanne Moron) 0.5 MG tablet Take 1 tablet (0.5 mg total) by mouth 2 (two) times daily as needed for anxiety. 05/26/15   Mosie Lukes, MD  cephALEXin (KEFLEX) 500 MG capsule Take 1 capsule (500 mg total) by mouth 2 (two) times daily. 08/11/15   Percell Miller Saguier, PA-C  citalopram (CELEXA) 20 MG tablet TAKE 1 TABLET (20 MG TOTAL) BY MOUTH DAILY. 07/20/15   Mosie Lukes, MD  cyclobenzaprine (FLEXERIL) 10 MG tablet Take 1 tablet (10 mg total) by mouth 3 (three)  times daily as needed for muscle spasms. 02/21/15   Mosie Lukes, MD  doxycycline (VIBRA-TABS) 100 MG tablet Take 1 tablet (100 mg total) by mouth 2 (two) times daily. Can give caps if tabs are too expensive. Generic ok. 08/01/15   Mackie Pai, PA-C  famciclovir (FAMVIR) 500 MG tablet Take 1 tablet (500 mg total) by mouth 3 (three) times daily. 08/11/15   Percell Miller Saguier, PA-C  gabapentin (NEURONTIN) 600 MG tablet Take 1 tablet (600 mg total) by mouth 3 (three) times daily. Reported on 05/26/2015 05/26/15   Mosie Lukes, MD  HYDROcodone-acetaminophen (NORCO/VICODIN) 5-325 MG tablet Take 1 tablet by mouth every 6 (six) hours as needed for moderate pain. 05/30/15   Mosie Lukes, MD  hydrOXYzine (ATARAX/VISTARIL) 10 MG tablet Take 1 tablet (10 mg total) by mouth every 8 (eight) hours as needed for itching. 08/01/15   Percell Miller Saguier, PA-C  ibuprofen (ADVIL,MOTRIN) 200 MG tablet Take 400 mg by mouth every 6 (six) hours as needed for mild pain or moderate pain. Reported on 07/27/2015    Historical Provider, MD  levETIRAcetam (KEPPRA) 500 MG tablet TAKE 1 TABLET (500 MG TOTAL) BY MOUTH TWO (2) TIMES A DAY. 03/21/15   Historical Provider, MD  metoprolol tartrate (LOPRESSOR) 25 MG tablet Take 1 tablet (25 mg total) by mouth 2 (two) times daily. 05/26/15   Mosie Lukes, MD  ondansetron (ZOFRAN ODT) 8 MG disintegrating tablet Take 1 tablet (8 mg total) by mouth every 8 (eight) hours as needed for nausea or vomiting. 07/28/15   Mackie Pai, PA-C  ondansetron (ZOFRAN ODT) 8 MG disintegrating tablet Take 1 tablet (8 mg total) by mouth every 8 (eight) hours as needed for nausea or vomiting. 08/11/15   Mackie Pai, PA-C  oxyCODONE-acetaminophen (ROXICET) 5-325 MG tablet Take 1-2 tablets by mouth every 4 (four) hours as needed. 06/02/15   Autumn Messing III, MD  pantoprazole (PROTONIX) 40 MG tablet TAKE 1 TABLET (40 MG TOTAL) BY MOUTH DAILY. 05/30/15   Mosie Lukes, MD  ranitidine (ZANTAC) 150 MG tablet Take 1 tablet (150 mg  total) by mouth 2 (two) times daily. 08/11/15   Edward Saguier, PA-C   BP 161/100 mmHg  Pulse 78  Temp(Src) 98.9 F (37.2 C) (Oral)  Resp 18  Ht 5\' 2"  (1.575 m)  Wt 105 lb (47.628 kg)  BMI 19.20 kg/m2  SpO2 100% Physical Exam  Constitutional: She appears well-developed and well-nourished.  HENT:  Head: Normocephalic and atraumatic.  Mucous membranes dry  Eyes: Conjunctivae are normal. Pupils are equal, round, and reactive to light.  Neck: Neck supple. No tracheal deviation present. No thyromegaly present.  Cardiovascular: Normal rate and regular rhythm.   No murmur heard. Pulmonary/Chest: Effort normal and breath sounds normal.  Abdominal: Soft. Bowel sounds are normal. She exhibits no distension. There is no  tenderness.  Musculoskeletal: Normal range of motion. She exhibits no edema or tenderness.  Neurological: She is alert. Coordination normal.  Skin: Skin is warm and dry. No rash noted.  Left thoracic back with 5 cm circular heaped up rash pinkish in color. No foreign body noted. Nontender  Psychiatric: She has a normal mood and affect.  Nursing note and vitals reviewed.   ED Course  Procedures (including critical care time) Labs Review Labs Reviewed - No data to display  Imaging Review No results found. I have personally reviewed and evaluated these images and lab results as part of my medical decision-making.   EKG Interpretation None     9:50 AM nurse reported to me patient had brief syncopal event immediately after nurse started an IV line. I went to examine patient she is alert and awake. In no distress. Her husband states that syncopal events happen to her "after seizures" but he saw no seizure activity immediately prior to syncopal event. 9:55 AM she complains of nausea. I v Zofran ordered. 10:55 AM she feels some improved after treatment with intravenous Zofran and with intravenous fluids. She was given ginger ale to drink. She vomited immediately after drinking  ginger ale. Intravenous Reglan ordered. 11:40 AM patient is able to drink several ounces of ginger ale without nausea or vomiting. She is presently hungry, feels well and ready to go home. Chest x-ray viewed by me Results for orders placed or performed during the hospital encounter of 08/12/15  Comprehensive metabolic panel  Result Value Ref Range   Sodium 139 135 - 145 mmol/L   Potassium 3.9 3.5 - 5.1 mmol/L   Chloride 105 101 - 111 mmol/L   CO2 25 22 - 32 mmol/L   Glucose, Bld 90 65 - 99 mg/dL   BUN 10 6 - 20 mg/dL   Creatinine, Ser 0.59 0.44 - 1.00 mg/dL   Calcium 9.6 8.9 - 10.3 mg/dL   Total Protein 8.2 (H) 6.5 - 8.1 g/dL   Albumin 4.5 3.5 - 5.0 g/dL   AST 20 15 - 41 U/L   ALT 14 14 - 54 U/L   Alkaline Phosphatase 58 38 - 126 U/L   Total Bilirubin 0.6 0.3 - 1.2 mg/dL   GFR calc non Af Amer >60 >60 mL/min   GFR calc Af Amer >60 >60 mL/min   Anion gap 9 5 - 15  CBC with Differential/Platelet  Result Value Ref Range   WBC 7.5 4.0 - 10.5 K/uL   RBC 5.07 3.87 - 5.11 MIL/uL   Hemoglobin 14.3 12.0 - 15.0 g/dL   HCT 42.1 36.0 - 46.0 %   MCV 83.0 78.0 - 100.0 fL   MCH 28.2 26.0 - 34.0 pg   MCHC 34.0 30.0 - 36.0 g/dL   RDW 14.3 11.5 - 15.5 %   Platelets 320 150 - 400 K/uL   Neutrophils Relative % 68 %   Neutro Abs 5.1 1.7 - 7.7 K/uL   Lymphocytes Relative 25 %   Lymphs Abs 1.9 0.7 - 4.0 K/uL   Monocytes Relative 5 %   Monocytes Absolute 0.4 0.1 - 1.0 K/uL   Eosinophils Relative 2 %   Eosinophils Absolute 0.2 0.0 - 0.7 K/uL   Basophils Relative 0 %   Basophils Absolute 0.0 0.0 - 0.1 K/uL  Urinalysis, Routine w reflex microscopic (not at [facility])  Result Value Ref Range   Color, Urine YELLOW YELLOW   APPearance CLEAR CLEAR   Specific Gravity, Urine 1.002 (L) 1.005 -  1.030   pH 7.0 5.0 - 8.0   Glucose, UA NEGATIVE NEGATIVE mg/dL   Hgb urine dipstick SMALL (A) NEGATIVE   Bilirubin Urine NEGATIVE NEGATIVE   Ketones, ur NEGATIVE NEGATIVE mg/dL   Protein, ur NEGATIVE NEGATIVE  mg/dL   Nitrite NEGATIVE NEGATIVE   Leukocytes, UA NEGATIVE NEGATIVE  Urine microscopic-add on  Result Value Ref Range   Squamous Epithelial / LPF 0-5 (A) NONE SEEN   WBC, UA NONE SEEN 0 - 5 WBC/hpf   RBC / HPF 0-5 0 - 5 RBC/hpf   Bacteria, UA NONE SEEN NONE SEEN   Dg Chest 2 View  08/12/2015  CLINICAL DATA:  Nausea, vomiting and fever and cough. Bit by tick 3 weeks ago. EXAM: CHEST  2 VIEW COMPARISON:  06/02/2010 FINDINGS: Heart and mediastinal contours are within normal limits. No focal opacities or effusions. No acute bony abnormality. IMPRESSION: No active cardiopulmonary disease. Electronically Signed   By: Rolm Baptise M.D.   On: 08/12/2015 10:01    MDM  Brief Syncope is felt to be secondary to vasovagal event in combination with mild dehydration. Plan prescription Reglan. Clear liquid diet for the next 12 hours. Encourage oral hydration. Keep scheduled appointment with Dr.Blythe next week or return if condition worsens or for protracted vomiting Final diagnoses:  None   Diagnosis #1 tick borne illness #2 dehydration #3 nausea and vomiting #4 syncope     Orlie Dakin, MD 08/12/15 1151

## 2015-08-12 NOTE — ED Notes (Signed)
MD at bedside discussing results, pt given ginger ale per MD's Request.

## 2015-08-12 NOTE — ED Notes (Signed)
Pt made aware to return if symptoms worsen or if any life threatening symptoms occur.   

## 2015-08-14 LAB — URINE CULTURE: Colony Count: 8000

## 2015-08-18 ENCOUNTER — Ambulatory Visit: Payer: 59 | Admitting: Medical

## 2015-08-22 ENCOUNTER — Ambulatory Visit (INDEPENDENT_AMBULATORY_CARE_PROVIDER_SITE_OTHER): Payer: 59 | Admitting: Family Medicine

## 2015-08-22 ENCOUNTER — Encounter: Payer: Self-pay | Admitting: Family Medicine

## 2015-08-22 VITALS — BP 120/82 | HR 86 | Temp 99.0°F | Ht 62.0 in | Wt 104.1 lb

## 2015-08-22 DIAGNOSIS — I10 Essential (primary) hypertension: Secondary | ICD-10-CM

## 2015-08-22 DIAGNOSIS — F419 Anxiety disorder, unspecified: Secondary | ICD-10-CM

## 2015-08-22 DIAGNOSIS — L309 Dermatitis, unspecified: Secondary | ICD-10-CM | POA: Diagnosis not present

## 2015-08-22 DIAGNOSIS — F32A Depression, unspecified: Secondary | ICD-10-CM

## 2015-08-22 DIAGNOSIS — R739 Hyperglycemia, unspecified: Secondary | ICD-10-CM

## 2015-08-22 DIAGNOSIS — D649 Anemia, unspecified: Secondary | ICD-10-CM

## 2015-08-22 DIAGNOSIS — K5900 Constipation, unspecified: Secondary | ICD-10-CM | POA: Diagnosis not present

## 2015-08-22 DIAGNOSIS — F329 Major depressive disorder, single episode, unspecified: Secondary | ICD-10-CM

## 2015-08-22 DIAGNOSIS — F418 Other specified anxiety disorders: Secondary | ICD-10-CM

## 2015-08-22 DIAGNOSIS — E782 Mixed hyperlipidemia: Secondary | ICD-10-CM

## 2015-08-22 HISTORY — DX: Mixed hyperlipidemia: E78.2

## 2015-08-22 HISTORY — DX: Dermatitis, unspecified: L30.9

## 2015-08-22 MED ORDER — RANITIDINE HCL 150 MG PO TABS: 150.0000 mg | ORAL_TABLET | Freq: Two times a day (BID) | ORAL | Status: DC

## 2015-08-22 MED ORDER — TRIAMCINOLONE ACETONIDE 0.1 % EX CREA: 1.0000 "application " | TOPICAL_CREAM | Freq: Two times a day (BID) | CUTANEOUS | Status: DC

## 2015-08-22 NOTE — Progress Notes (Signed)
Pre visit review using our clinic review tool, if applicable. No additional management support is needed unless otherwise documented below in the visit note. 

## 2015-08-22 NOTE — Patient Instructions (Signed)
Cleanse with witch hazel then apply Triamcinolone twice daily  Eczema Eczema, also called atopic dermatitis, is a skin disorder that causes inflammation of the skin. It causes a red rash and dry, scaly skin. The skin becomes very itchy. Eczema is generally worse during the cooler winter months and often improves with the warmth of summer. Eczema usually starts showing signs in infancy. Some children outgrow eczema, but it may last through adulthood.  CAUSES  The exact cause of eczema is not known, but it appears to run in families. People with eczema often have a family history of eczema, allergies, asthma, or hay fever. Eczema is not contagious. Flare-ups of the condition may be caused by:   Contact with something you are sensitive or allergic to.   Stress. SIGNS AND SYMPTOMS  Dry, scaly skin.   Red, itchy rash.   Itchiness. This may occur before the skin rash and may be very intense.  DIAGNOSIS  The diagnosis of eczema is usually made based on symptoms and medical history. TREATMENT  Eczema cannot be cured, but symptoms usually can be controlled with treatment and other strategies. A treatment plan might include:  Controlling the itching and scratching.   Use over-the-counter antihistamines as directed for itching. This is especially useful at night when the itching tends to be worse.   Use over-the-counter steroid creams as directed for itching.   Avoid scratching. Scratching makes the rash and itching worse. It may also result in a skin infection (impetigo) due to a break in the skin caused by scratching.   Keeping the skin well moisturized with creams every day. This will seal in moisture and help prevent dryness. Lotions that contain alcohol and water should be avoided because they can dry the skin.   Limiting exposure to things that you are sensitive or allergic to (allergens).   Recognizing situations that cause stress.   Developing a plan to manage stress.   HOME CARE INSTRUCTIONS   Only take over-the-counter or prescription medicines as directed by your health care provider.   Do not use anything on the skin without checking with your health care provider.   Keep baths or showers short (5 minutes) in warm (not hot) water. Use mild cleansers for bathing. These should be unscented. You may add nonperfumed bath oil to the bath water. It is best to avoid soap and bubble bath.   Immediately after a bath or shower, when the skin is still damp, apply a moisturizing ointment to the entire body. This ointment should be a petroleum ointment. This will seal in moisture and help prevent dryness. The thicker the ointment, the better. These should be unscented.   Keep fingernails cut short. Children with eczema may need to wear soft gloves or mittens at night after applying an ointment.   Dress in clothes made of cotton or cotton blends. Dress lightly, because heat increases itching.   A child with eczema should stay away from anyone with fever blisters or cold sores. The virus that causes fever blisters (herpes simplex) can cause a serious skin infection in children with eczema. SEEK MEDICAL CARE IF:   Your itching interferes with sleep.   Your rash gets worse or is not better within 1 week after starting treatment.   You see pus or soft yellow scabs in the rash area.   You have a fever.   You have a rash flare-up after contact with someone who has fever blisters.    This information is not intended  to replace advice given to you by your health care provider. Make sure you discuss any questions you have with your health care provider.   Document Released: 03/02/2000 Document Revised: 12/24/2012 Document Reviewed: 10/06/2012 Elsevier Interactive Patient Education [employer].

## 2015-08-22 NOTE — Assessment & Plan Note (Signed)
Well controlled, no changes to meds. Encouraged heart healthy diet such as the DASH diet and exercise as tolerated.  °

## 2015-08-22 NOTE — Assessment & Plan Note (Addendum)
Right arm and posterior neck inflamed, given refill on Triamcinolone to use sparingly. Stay well hydrated and use mild skin care products.

## 2015-09-02 NOTE — Assessment & Plan Note (Signed)
minimize simple carbs. Increase exercise as tolerated.  

## 2015-09-02 NOTE — Progress Notes (Signed)
Patient ID: VERCIE ROADMAN, female   DOB: 1961-01-15, 55 y.o.   MRN: [id number]   Subjective:    Patient ID: Ruth Bell, female    DOB: 1961-02-09, 55 y.o.   MRN: [id number]  Chief Complaint  Patient presents with  . Follow-up    HPI Patient is in today for follow up. She is doing well. No recent illness or hospitalization. Her eczema has flared recently and she is having right arm, back and neck lesions that are pruritic. Her depression and anxiety are adequately managed at this time. No suicidal ideation. Denies CP/palp/SOB/HA/congestion/fevers/GI or GU c/o. Taking meds as prescribed Past Medical History  Diagnosis Date  . Migraine, unspecified, without mention of intractable migraine without mention of status migrainosus   . Anemia   . Menometrorrhagia 2006  . Uterine leiomyoma 2006  . Depression   . Hypertension   . Tubular adenoma of colon 05/2011  . Constipation 12/27/2013  . Arm skin lesion, left 09/05/2014  . Hyperglycemia 01/15/2015  . Anxiety   . GERD (gastroesophageal reflux disease)   . Seizures ([place])     history of pseudoseizure disorder, last last seizure 3 wks, keppra inc in dosage  . Eczema 08/22/2015  . Hyperlipidemia, mixed 08/22/2015    Past Surgical History  Procedure Laterality Date  . Appendectomy    . Bilateral salpingoophorectomy  04/06/04  . Abdominal hysterectomy  04/06/04  . Rotator cuff repair    . Mass excision Left 06/02/2015    Procedure: EXCISION LEFT ARM MASS;  Surgeon: Autumn Messing III, MD;  Location: [facility];  Service: General;  Laterality: Left;    Family History  Problem Relation Age of Onset  . Heart disease Mother   . Heart attack Mother   . Cancer Father     colon  . Hypertension Father   . Cancer Sister     brain  . Cancer Brother   . Hypertension Sister   . Cancer Sister     unsure of type  . Diabetes Sister     type 2  . Heart attack Sister   . Hypertension Brother   . Colon cancer Neg Hx     Social History    Social History  . Marital Status: Married    Spouse Name: Gwyndolyn Saxon  . Number of Children: N/A  . Years of Education: N/A   Occupational History  . CMA    Social History Main Topics  . Smoking status: Current Every Day Smoker -- 0.50 packs/day for 40 years    Types: Cigarettes    Start date: 11/17/2013  . Smokeless tobacco: Never Used     Comment: 3-4 cigarettes daily  . Alcohol Use: No  . Drug Use: No  . Sexual Activity: Not on file     Comment: lives with husband, no dietary restrictions.    Other Topics Concern  . Not on file   Social History Narrative    Outpatient Prescriptions Prior to Visit  Medication Sig Dispense Refill  . ALPRAZolam (XANAX) 0.5 MG tablet Take 1 tablet (0.5 mg total) by mouth 2 (two) times daily as needed for anxiety. 60 tablet 0  . citalopram (CELEXA) 20 MG tablet TAKE 1 TABLET (20 MG TOTAL) BY MOUTH DAILY. 30 tablet 0  . cyclobenzaprine (FLEXERIL) 10 MG tablet Take 1 tablet (10 mg total) by mouth 3 (three) times daily as needed for muscle spasms. 90 tablet 1  . doxycycline (VIBRA-TABS) 100 MG tablet Take 1 tablet (  100 mg total) by mouth 2 (two) times daily. Can give caps if tabs are too expensive. Generic ok. 20 tablet 0  . famciclovir (FAMVIR) 500 MG tablet Take 1 tablet (500 mg total) by mouth 3 (three) times daily. 21 tablet 0  . gabapentin (NEURONTIN) 600 MG tablet Take 1 tablet (600 mg total) by mouth 3 (three) times daily. Reported on 05/26/2015 90 tablet 2  . HYDROcodone-acetaminophen (NORCO/VICODIN) 5-325 MG tablet Take 1 tablet by mouth every 6 (six) hours as needed for moderate pain. 60 tablet 0  . hydrOXYzine (ATARAX/VISTARIL) 10 MG tablet Take 1 tablet (10 mg total) by mouth every 8 (eight) hours as needed for itching. 21 tablet 0  . ibuprofen (ADVIL,MOTRIN) 200 MG tablet Take 400 mg by mouth every 6 (six) hours as needed for mild pain or moderate pain. Reported on 07/27/2015    . levETIRAcetam (KEPPRA) 500 MG tablet TAKE 1 TABLET (500 MG  TOTAL) BY MOUTH TWO (2) TIMES A DAY.  3  . metoCLOPramide (REGLAN) 10 MG tablet Take 1 tablet (10 mg total) by mouth every 8 (eight) hours as needed for nausea (nausea/headache). 12 tablet 0  . metoprolol tartrate (LOPRESSOR) 25 MG tablet Take 1 tablet (25 mg total) by mouth 2 (two) times daily. 60 tablet 5  . ondansetron (ZOFRAN ODT) 8 MG disintegrating tablet Take 1 tablet (8 mg total) by mouth every 8 (eight) hours as needed for nausea or vomiting. 20 tablet 0  . ondansetron (ZOFRAN ODT) 8 MG disintegrating tablet Take 1 tablet (8 mg total) by mouth every 8 (eight) hours as needed for nausea or vomiting. 9 tablet 0  . oxyCODONE-acetaminophen (ROXICET) 5-325 MG tablet Take 1-2 tablets by mouth every 4 (four) hours as needed. 30 tablet 0  . pantoprazole (PROTONIX) 40 MG tablet TAKE 1 TABLET (40 MG TOTAL) BY MOUTH DAILY. 30 tablet 11  . ranitidine (ZANTAC) 150 MG tablet Take 1 tablet (150 mg total) by mouth 2 (two) times daily. 60 tablet 0  . cephALEXin (KEFLEX) 500 MG capsule Take 1 capsule (500 mg total) by mouth 2 (two) times daily. 20 capsule 0   No facility-administered medications prior to visit.    No Known Allergies  Review of Systems  Constitutional: Negative for fever and malaise/fatigue.  HENT: Negative for congestion.   Eyes: Negative for blurred vision.  Respiratory: Negative for shortness of breath.   Cardiovascular: Negative for chest pain, palpitations and leg swelling.  Gastrointestinal: Negative for nausea, abdominal pain and blood in stool.  Genitourinary: Negative for dysuria and frequency.  Musculoskeletal: Positive for back pain and joint pain. Negative for falls.  Skin: Positive for itching and rash.  Neurological: Negative for dizziness, loss of consciousness and headaches.  Endo/Heme/Allergies: Negative for environmental allergies.  Psychiatric/Behavioral: Negative for depression. The patient is nervous/anxious.        Objective:    Physical Exam    Constitutional: She is oriented to person, place, and time. She appears well-developed and well-nourished. No distress.  HENT:  Head: Normocephalic and atraumatic.  Nose: Nose normal.  Eyes: Right eye exhibits no discharge. Left eye exhibits no discharge.  Neck: Normal range of motion. Neck supple.  Cardiovascular: Normal rate and regular rhythm.   No murmur heard. Pulmonary/Chest: Effort normal and breath sounds normal.  Abdominal: Soft. Bowel sounds are normal. There is no tenderness.  Musculoskeletal: She exhibits no edema.  Neurological: She is alert and oriented to person, place, and time.  Skin: Skin is warm and dry.  Psychiatric: She has a normal mood and affect.  Nursing note and vitals reviewed.   BP 120/82 mmHg  Pulse 86  Temp(Src) 99 F (37.2 C) (Oral)  Ht 5\' 2"  (1.575 m)  Wt 104 lb 2 oz (47.231 kg)  BMI 19.04 kg/m2  SpO2 99% Wt Readings from Last 3 Encounters:  08/22/15 104 lb 2 oz (47.231 kg)  08/12/15 105 lb (47.628 kg)  08/11/15 105 lb 6.4 oz (47.809 kg)     Lab Results  Component Value Date   WBC 7.5 08/12/2015   HGB 14.3 08/12/2015   HCT 42.1 08/12/2015   PLT 320 08/12/2015   GLUCOSE 90 08/12/2015   CHOL 214* 05/26/2015   TRIG 104.0 05/26/2015   HDL 59.50 05/26/2015   LDLCALC 134* 05/26/2015   ALT 14 08/12/2015   AST 20 08/12/2015   NA 139 08/12/2015   K 3.9 08/12/2015   CL 105 08/12/2015   CREATININE 0.59 08/12/2015   BUN 10 08/12/2015   CO2 25 08/12/2015   TSH 1.02 05/26/2015   INR 1.0 11/19/2013   HGBA1C 6.6* 05/26/2015    Lab Results  Component Value Date   TSH 1.02 05/26/2015   Lab Results  Component Value Date   WBC 7.5 08/12/2015   HGB 14.3 08/12/2015   HCT 42.1 08/12/2015   MCV 83.0 08/12/2015   PLT 320 08/12/2015   Lab Results  Component Value Date   NA 139 08/12/2015   K 3.9 08/12/2015   CO2 25 08/12/2015   GLUCOSE 90 08/12/2015   BUN 10 08/12/2015   CREATININE 0.59 08/12/2015   BILITOT 0.6 08/12/2015   ALKPHOS  58 08/12/2015   AST 20 08/12/2015   ALT 14 08/12/2015   PROT 8.2* 08/12/2015   ALBUMIN 4.5 08/12/2015   CALCIUM 9.6 08/12/2015   ANIONGAP 9 08/12/2015   GFR 127.67 07/28/2015   Lab Results  Component Value Date   CHOL 214* 05/26/2015   Lab Results  Component Value Date   HDL 59.50 05/26/2015   Lab Results  Component Value Date   LDLCALC 134* 05/26/2015   Lab Results  Component Value Date   TRIG 104.0 05/26/2015   Lab Results  Component Value Date   CHOLHDL 4 05/26/2015   Lab Results  Component Value Date   HGBA1C 6.6* 05/26/2015       Assessment & Plan:   Problem List Items Addressed This Visit    Hyperlipidemia, mixed    Encouraged heart healthy diet, increase exercise, avoid trans fats, consider a krill oil cap daily      Relevant Orders   TSH   CBC   Hemoglobin A1c   Lipid panel   Comprehensive metabolic panel   Hyperglycemia    minimize simple carbs. Increase exercise as tolerated.       Relevant Orders   TSH   CBC   Hemoglobin A1c   Lipid panel   Comprehensive metabolic panel   HTN (hypertension)    Well controlled, no changes to meds. Encouraged heart healthy diet such as the DASH diet and exercise as tolerated.       Relevant Orders   TSH   CBC   Hemoglobin A1c   Lipid panel   Comprehensive metabolic panel   Eczema - Primary    Right arm and posterior neck inflamed, given refill on Triamcinolone to use sparingly. Stay well hydrated and use mild skin care products.       Relevant Orders   TSH   CBC  Hemoglobin A1c   Lipid panel   Comprehensive metabolic panel   Constipation   Relevant Orders   TSH   CBC   Hemoglobin A1c   Lipid panel   Comprehensive metabolic panel   Anxiety and depression    Doing better on current meds no changes today      ANEMIA   Relevant Orders   TSH   CBC   Hemoglobin A1c   Lipid panel   Comprehensive metabolic panel      I have discontinued Ms. Joy's cephALEXin. I am also having her  start on ranitidine and triamcinolone cream. Additionally, I am having her maintain her ibuprofen, cyclobenzaprine, levETIRAcetam, metoprolol tartrate, ALPRAZolam, gabapentin, pantoprazole, HYDROcodone-acetaminophen, oxyCODONE-acetaminophen, citalopram, ondansetron, doxycycline, hydrOXYzine, famciclovir, ranitidine, ondansetron, and metoCLOPramide.  Meds ordered this encounter  Medications  . ranitidine (ZANTAC) 150 MG tablet    Sig: Take 1 tablet (150 mg total) by mouth 2 (two) times daily.    Dispense:  60 tablet    Refill:  2  . triamcinolone cream (KENALOG) 0.1 %    Sig: Apply 1 application topically 2 (two) times daily.    Dispense:  80 g    Refill:  2     [name], MD

## 2015-09-02 NOTE — Assessment & Plan Note (Signed)
Doing better on current meds no changes today

## 2015-09-02 NOTE — Assessment & Plan Note (Signed)
Encouraged heart healthy diet, increase exercise, avoid trans fats, consider a krill oil cap daily 

## 2015-09-16 ENCOUNTER — Other Ambulatory Visit: Payer: Self-pay | Admitting: Family Medicine

## 2015-09-16 MED ORDER — CITALOPRAM HYDROBROMIDE 20 MG PO TABS: ORAL_TABLET | ORAL | Status: DC

## 2015-10-20 ENCOUNTER — Other Ambulatory Visit: Payer: Self-pay | Admitting: Family Medicine

## 2015-11-07 ENCOUNTER — Other Ambulatory Visit: Payer: 59

## 2015-11-11 ENCOUNTER — Encounter: Payer: 59 | Admitting: Family Medicine

## 2015-12-09 ENCOUNTER — Ambulatory Visit (INDEPENDENT_AMBULATORY_CARE_PROVIDER_SITE_OTHER): Payer: 59

## 2015-12-09 ENCOUNTER — Ambulatory Visit (INDEPENDENT_AMBULATORY_CARE_PROVIDER_SITE_OTHER): Payer: 59 | Admitting: Physician Assistant

## 2015-12-09 VITALS — BP 146/88 | HR 78 | Temp 98.6°F | Resp 18 | Ht 62.0 in | Wt 101.2 lb

## 2015-12-09 DIAGNOSIS — L309 Dermatitis, unspecified: Secondary | ICD-10-CM | POA: Diagnosis not present

## 2015-12-09 DIAGNOSIS — J029 Acute pharyngitis, unspecified: Secondary | ICD-10-CM | POA: Diagnosis not present

## 2015-12-09 DIAGNOSIS — R05 Cough: Secondary | ICD-10-CM

## 2015-12-09 DIAGNOSIS — R059 Cough, unspecified: Secondary | ICD-10-CM

## 2015-12-09 DIAGNOSIS — J040 Acute laryngitis: Secondary | ICD-10-CM

## 2015-12-09 LAB — POCT CBC
Granulocyte percent: 55.5 %G (ref 37–80)
HCT, POC: 39.4 % (ref 37.7–47.9)
Hemoglobin: 13.4 g/dL (ref 12.2–16.2)
Lymph, poc: 3.4 (ref 0.6–3.4)
MCH, POC: 28.6 pg (ref 27–31.2)
MCHC: 34 g/dL (ref 31.8–35.4)
MCV: 83.9 fL (ref 80–97)
MID (cbc): 0.5 (ref 0–0.9)
MPV: 7.1 fL (ref 0–99.8)
POC Granulocyte: 4.9 (ref 2–6.9)
POC LYMPH PERCENT: 38.5 %L (ref 10–50)
POC MID %: 6 %M (ref 0–12)
Platelet Count, POC: 275 10*3/uL (ref 142–424)
RBC: 4.69 M/uL (ref 4.04–5.48)
RDW, POC: 16 %
WBC: 8.8 10*3/uL (ref 4.6–10.2)

## 2015-12-09 LAB — POCT RAPID STREP A (OFFICE): Rapid Strep A Screen: NEGATIVE

## 2015-12-09 MED ORDER — AZELASTINE HCL 0.15 % NA SOLN: 2.0000 | Freq: Two times a day (BID) | NASAL | 0 refills | Status: DC

## 2015-12-09 MED ORDER — GUAIFENESIN ER 1200 MG PO TB12: 1.0000 | ORAL_TABLET | Freq: Two times a day (BID) | ORAL | 1 refills | Status: DC | PRN

## 2015-12-09 MED ORDER — TRIAMCINOLONE ACETONIDE 0.1 % EX CREA: 1.0000 "application " | TOPICAL_CREAM | Freq: Two times a day (BID) | CUTANEOUS | 0 refills | Status: DC

## 2015-12-09 MED ORDER — BENZONATATE 100 MG PO CAPS: 100.0000 mg | ORAL_CAPSULE | Freq: Three times a day (TID) | ORAL | 0 refills | Status: DC | PRN

## 2015-12-09 NOTE — Patient Instructions (Addendum)
1. Continue the medications you have from Dr. Randel Pigg. Some of these will be helping your symptoms.  2. Use a heating pad or warm compress on the neck where it is sore.  3. The Mucinex will help thin the mucous. The Tessalon Perles will help to suppress the cough. The Azelastine nasal spray will help if this is coming from drainage.  4. Use the triamcinolone cream to the bumps on your neck. Unfortunately, you can't use it on your face. It is the same as what Dr. Randel Pigg gave you this summer (but a smaller tube).  5. Get plenty of rest and drink at least 64 ounces of water daily.       IF you received an x-ray today, you will receive an invoice from [facility]. Please contact [facility] at [phone] with questions or concerns regarding your invoice.   IF you received labwork today, you will receive an invoice from [employer]. Please contact [facility] at [phone] with questions or concerns regarding your invoice.   Our billing staff will not be able to assist you with questions regarding bills from these companies.  You will be contacted with the lab results as soon as they are available. The fastest way to get your results is to activate your My Chart account. Instructions are located on the last page of this paperwork. If you have not heard from [name] regarding the results in 2 weeks, please contact this office.

## 2015-12-09 NOTE — Progress Notes (Signed)
Patient ID: Ruth Bell, female     DOB: 1961/02/21, 55 y.o.    MRN: [id number]  PCP: [name], MD  Chief Complaint  Patient presents with  . Rash    located on face and neck     Subjective:    HPI  Presents for evaluation of a rash. However, upon entering the room, I find the patient coughing almost uncontrollably and with severe laryngitis.  Sunday (9/17) she developed a cough (produces clear sputum), laryngitis, sore throat. The cough is so vigorous that her ribs are sore. Headache with coughing as well. She has soreness of the LEFT neck. Intermittent subjective fever/chills. No muscle aches other than the chest wall. No known sick contacts, but several days before the onset of her symptoms, she had walked to work in the rain.  Yesterday she developed rash on the face and neck. This morning, the LEFT upper eyelid was swollen. Her husband and son thought she might have been bitten by something, due to a dark spot in the center of the swollen area. The rash in primarily on the RIGHT neck, but there is also a bump on the base of the LEFT neck. Reports similar lesions on the arms after being at her niece's house previously. She shows me the residual scarring on the RIGHT forearm.  While there have been no new foods, medications, soaps, cleaning products, detergents, etc, she has been back to her niece's house. There are no pets, unusual scents, etc.  "I can't keep anything down." Nausea. Vomiting. Anytime she eats, "It just comes right back up." Very hungry. Trying to drink some broth. The vomiting is not cough induced emesis.  She reports that she is already taking: ondansetron, ranitidine, pantoprazole and metoclopramide; ibuprofen, cyclobenzaprine and oxycodone.   I note a history of eczema and a prescription for triamcinolone cream earlier this year.  She continues to smoke 3-4 cigarettes each day.  Prior to Admission medications   Medication Sig Start Date End Date  Taking? Authorizing Provider  ALPRAZolam Duanne Moron) 0.5 MG tablet Take 1 tablet (0.5 mg total) by mouth 2 (two) times daily as needed for anxiety. 05/26/15  Yes Mosie Lukes, MD  citalopram (CELEXA) 20 MG tablet TAKE 1 TABLET (20 MG TOTAL) BY MOUTH DAILY. 09/16/15  Yes Mosie Lukes, MD  cyclobenzaprine (FLEXERIL) 10 MG tablet Take 1 tablet (10 mg total) by mouth 3 (three) times daily as needed for muscle spasms. 02/21/15  Yes Mosie Lukes, MD  famciclovir (FAMVIR) 500 MG tablet Take 1 tablet (500 mg total) by mouth 3 (three) times daily. 08/11/15  Yes Edward Saguier, PA-C  gabapentin (NEURONTIN) 600 MG tablet TAKE 1 TABLET (600 MG TOTAL) BY MOUTH 3 (THREE) TIMES DAILY. REPORTED ON 05/26/2015 10/20/15  Yes Mosie Lukes, MD  hydrOXYzine (ATARAX/VISTARIL) 10 MG tablet Take 1 tablet (10 mg total) by mouth every 8 (eight) hours as needed for itching. 08/01/15  Yes Edward Saguier, PA-C  ibuprofen (ADVIL,MOTRIN) 200 MG tablet Take 400 mg by mouth every 6 (six) hours as needed for mild pain or moderate pain. Reported on 07/27/2015   Yes Historical Provider, MD  levETIRAcetam (KEPPRA) 500 MG tablet TAKE 1 TABLET (500 MG TOTAL) BY MOUTH TWO (2) TIMES A DAY. 03/21/15  Yes Historical Provider, MD  metoCLOPramide (REGLAN) 10 MG tablet Take 1 tablet (10 mg total) by mouth every 8 (eight) hours as needed for nausea (nausea/headache). 08/12/15  Yes Orlie Dakin, MD  metoprolol tartrate (LOPRESSOR) 25  MG tablet Take 1 tablet (25 mg total) by mouth 2 (two) times daily. 05/26/15  Yes Mosie Lukes, MD  ondansetron (ZOFRAN ODT) 8 MG disintegrating tablet Take 1 tablet (8 mg total) by mouth every 8 (eight) hours as needed for nausea or vomiting. 07/28/15  Yes Mackie Pai, PA-C  ondansetron (ZOFRAN ODT) 8 MG disintegrating tablet Take 1 tablet (8 mg total) by mouth every 8 (eight) hours as needed for nausea or vomiting. 08/11/15  Yes Mackie Pai, PA-C  oxyCODONE-acetaminophen (ROXICET) 5-325 MG tablet Take 1-2 tablets by mouth  every 4 (four) hours as needed. 06/02/15  Yes Autumn Messing III, MD  pantoprazole (PROTONIX) 40 MG tablet TAKE 1 TABLET (40 MG TOTAL) BY MOUTH DAILY. 05/30/15  Yes Mosie Lukes, MD  ranitidine (ZANTAC) 150 MG tablet Take 1 tablet (150 mg total) by mouth 2 (two) times daily. 08/11/15  Yes Edward Saguier, PA-C  ranitidine (ZANTAC) 150 MG tablet Take 1 tablet (150 mg total) by mouth 2 (two) times daily. 08/22/15  Yes Mosie Lukes, MD  triamcinolone cream (KENALOG) 0.1 % Apply 1 application topically 2 (two) times daily. 08/22/15  Yes Mosie Lukes, MD     No Known Allergies   Patient Active Problem List   Diagnosis Date Noted  . Eczema 08/22/2015  . Hyperlipidemia, mixed 08/22/2015  . Hyperglycemia 01/15/2015  . Lumbar radiculopathy 11/19/2014  . Arm skin lesion, left 09/05/2014  . Noncompliance with medications 08/11/2014  . Constipation 12/27/2013  . Pain in joint, shoulder region 10/13/2013  . Shoulder pain 09/23/2013  . Numbness and tingling in right hand 09/23/2013  . Jaw pain 03/26/2013  . Left leg weakness 07/03/2012  . Abdominal pain, right upper quadrant 05/17/2012  . Neck pain 05/17/2012  . Seizure disorder ([place]) 02/21/2012  . Non compliance w medication regimen 02/21/2012  . HTN (hypertension) 09/06/2011  . Hemorrhoids 06/20/2011  . Hx of adenomatous colonic polyps 06/13/2011  . Musculoskeletal pain 04/23/2011  . Radiculopathy of leg 02/02/2011  . History of pseudoseizure 07/10/2010  . Anxiety and depression 12/29/2009  . TINEA PEDIS 06/02/2009  . MICROSCOPIC HEMATURIA 06/24/2007  . WEIGHT LOSS 04/28/2007  . ANEMIA 03/10/2007  . Migraine 03/10/2007  . ARTHRITIS 03/10/2007  . SYNCOPE 03/10/2007     Family History  Problem Relation Age of Onset  . Heart disease Mother   . Heart attack Mother   . Cancer Father     colon  . Hypertension Father   . Cancer Sister     brain  . Cancer Brother   . Hypertension Sister   . Cancer Sister     unsure of type  . Diabetes  Sister     type 2  . Heart attack Sister   . Hypertension Brother   . Colon cancer Neg Hx      Social History   Social History  . Marital status: Married    Spouse name: Gwyndolyn Saxon  . Number of children: N/A  . Years of education: N/A   Occupational History  . CMA [employer]   Social History Main Topics  . Smoking status: Current Every Day Smoker    Packs/day: 0.50    Years: 40.00    Types: Cigarettes    Start date: 11/17/2013  . Smokeless tobacco: Never Used     Comment: 3-4 cigarettes daily  . Alcohol use No  . Drug use: No  . Sexual activity: Not on file     Comment: lives with  husband, no dietary restrictions.    Other Topics Concern  . Not on file   Social History Narrative   Lives with her husband and their son.   Daughter lives in [place], [place].        Review of Systems As above,      Objective:  Physical Exam  Constitutional: She is oriented to person, place, and time. She appears well-developed and well-nourished. She is active and cooperative. She appears distressed (coughing almost continuously during visit).  BP (!) 146/88   Pulse 78   Temp 98.6 F (37 C) (Oral)   Resp 18   Ht 5\' 2"  (1.575 m)   Wt 101 lb 3.2 oz (45.9 kg)   SpO2 100%   BMI 18.51 kg/m   HENT:  Head: Normocephalic and atraumatic.  Right Ear: Hearing, tympanic membrane, external ear and ear canal normal.  Left Ear: Hearing, tympanic membrane, external ear and ear canal normal.  Nose: Nose normal. Right sinus exhibits no maxillary sinus tenderness and no frontal sinus tenderness. Left sinus exhibits no maxillary sinus tenderness and no frontal sinus tenderness.  Mouth/Throat: Uvula is midline, oropharynx is clear and moist and mucous membranes are normal. No oral lesions. No uvula swelling.  Eyes: Conjunctivae, EOM and lids are normal. Pupils are equal, round, and reactive to light. No scleral icterus.  Neck: Normal range of motion, full passive range of motion without  pain and phonation normal. Neck supple. No thyromegaly present.  Cardiovascular: Normal rate, regular rhythm and normal heart sounds.   Pulses:      Radial pulses are 2+ on the right side, and 2+ on the left side.  Pulmonary/Chest: Effort normal and breath sounds normal.  Lymphadenopathy:       Head (right side): No tonsillar, no preauricular, no posterior auricular and no occipital adenopathy present.       Head (left side): No tonsillar, no preauricular, no posterior auricular and no occipital adenopathy present.    She has no cervical adenopathy.       Right: No supraclavicular adenopathy present.       Left: No supraclavicular adenopathy present.  Neurological: She is alert and oriented to person, place, and time. No sensory deficit.  Skin: Skin is warm, dry and intact. Rash noted. Rash is urticarial (some excoriated, RIGHT anterior neck; one lesion at the base of the LEFT anterior neck; one lesion in the medial LEFT eyebrow). No cyanosis or erythema. Nails show no clubbing.  Psychiatric: She has a normal mood and affect. Her speech is normal and behavior is normal.        Results for orders placed or performed in visit on 12/09/15  POCT rapid strep A  Result Value Ref Range   Rapid Strep A Screen Negative Negative  POCT CBC  Result Value Ref Range   WBC 8.8 4.6 - 10.2 K/uL   Lymph, poc 3.4 0.6 - 3.4   POC LYMPH PERCENT 38.5 10 - 50 %L   MID (cbc) 0.5 0 - 0.9   POC MID % 6.0 0 - 12 %M   POC Granulocyte 4.9 2 - 6.9   Granulocyte percent 55.5 37 - 80 %G   RBC 4.69 4.04 - 5.48 M/uL   Hemoglobin 13.4 12.2 - 16.2 g/dL   HCT, POC 39.4 37.7 - 47.9 %   MCV 83.9 80 - 97 fL   MCH, POC 28.6 27 - 31.2 pg   MCHC 34.0 31.8 - 35.4 g/dL   RDW, POC 16.0 %  Platelet Count, POC 275 142 - 424 K/uL   MPV 7.1 0 - 99.8 fL    Dg Chest 2 View  Result Date: 12/09/2015 CLINICAL DATA:  Cough.  Tobacco use EXAM: CHEST  2 VIEW COMPARISON:  Aug 12, 2015 FINDINGS: There is no edema or  consolidation. The heart size and pulmonary vascularity are normal. No adenopathy. There is atherosclerotic calcification in the aorta. No adenopathy. No bone lesions. IMPRESSION: No edema or consolidation.  Aortic atherosclerosis. Electronically Signed   By: Lowella Grip III M.D.   On: 12/09/2015 18:44       Assessment & Plan:  1. Cough Unclear cause for cough. CBC and CXR are reassuring. Possibly due to post-nasal draiange, so try nasal spray and Mucinex, along with Tessalon Perles. - DG Chest 2 View; Future - POCT CBC - Azelastine HCl 0.15 % SOLN; Place 2 sprays into both nostrils 2 (two) times daily.  Dispense: 30 mL; Refill: 0 - Guaifenesin (MUCINEX MAXIMUM STRENGTH) 1200 MG TB12; Take 1 tablet (1,200 mg total) by mouth every 12 (twelve) hours as needed.  Dispense: 14 tablet; Refill: 1 - benzonatate (TESSALON) 100 MG capsule; Take 1-2 capsules (100-200 mg total) by mouth 3 (three) times daily as needed for cough.  Dispense: 40 capsule; Refill: 0  2. Sore throat Likely due to coughing. Await TCx.  - POCT rapid strep A - Culture, Group A Strep  3. Laryngitis Due to above.  4. Dermatitis Reactive. Unclear etiology. Similar lesions have responded well to topical steroids. - triamcinolone cream (KENALOG) 0.1 %; Apply 1 application topically 2 (two) times daily.  Dispense: 45 g; Refill: 0   Fara Chute, PA-C Physician Assistant-Certified [facility]

## 2015-12-11 LAB — CULTURE, GROUP A STREP: Organism ID, Bacteria: NORMAL

## 2015-12-14 ENCOUNTER — Encounter: Payer: Self-pay | Admitting: Physician Assistant

## 2015-12-20 IMAGING — DX DG LUMBAR SPINE COMPLETE 4+V
5 series · 5 of 5 positions shown · non-contrast
Comparison: Lumbar MRI July 09, 2012 ; lumbar spine radiographs
July 02, 2012

CLINICAL DATA: Lumbago for 3 weeks with right-sided radicular
symptoms

EXAM:
LUMBAR SPINE - COMPLETE 4+ VIEW

[l-spine ap]
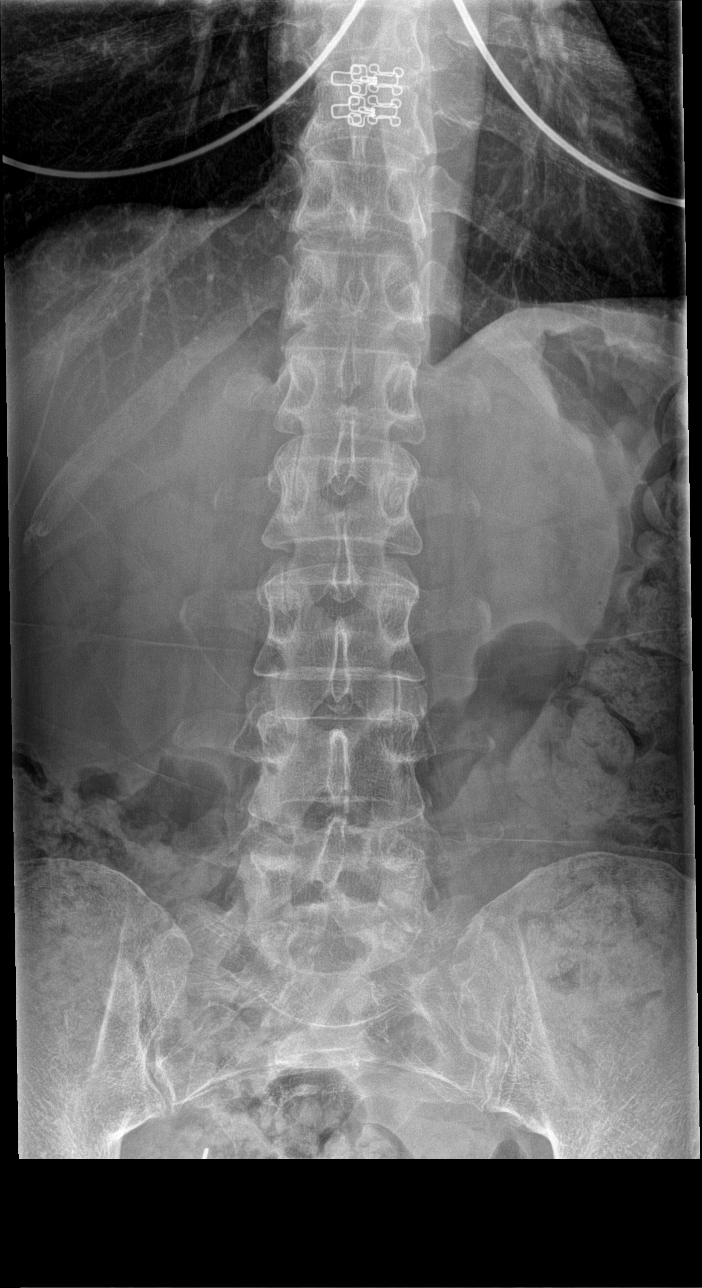

[l-spine obl (1 of 2)]
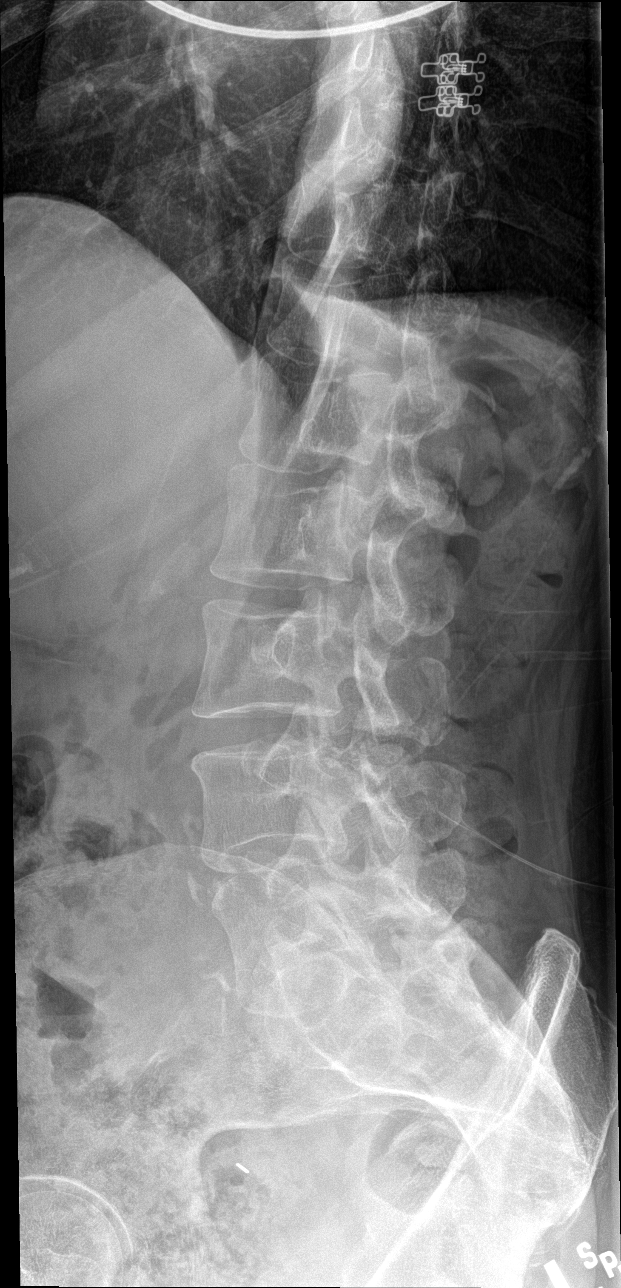

[l-spine obl (2 of 2)]
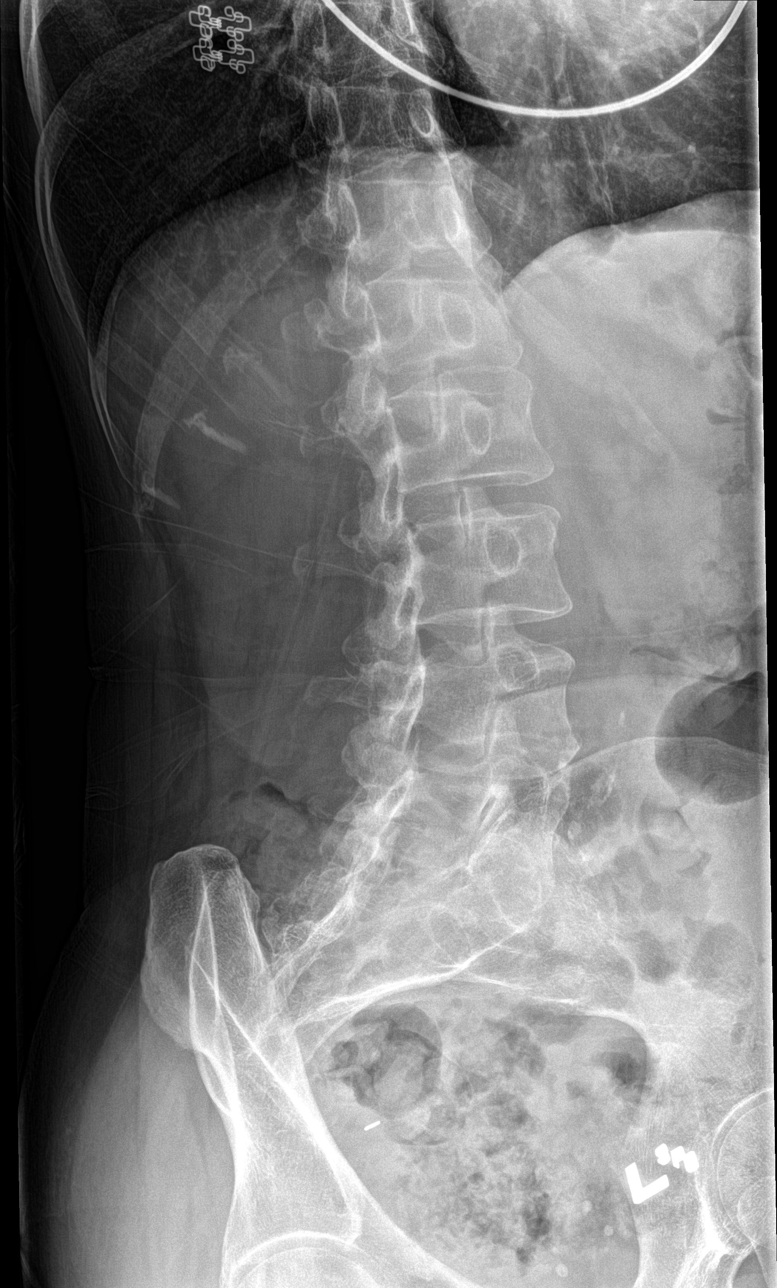

[l-spine lat]
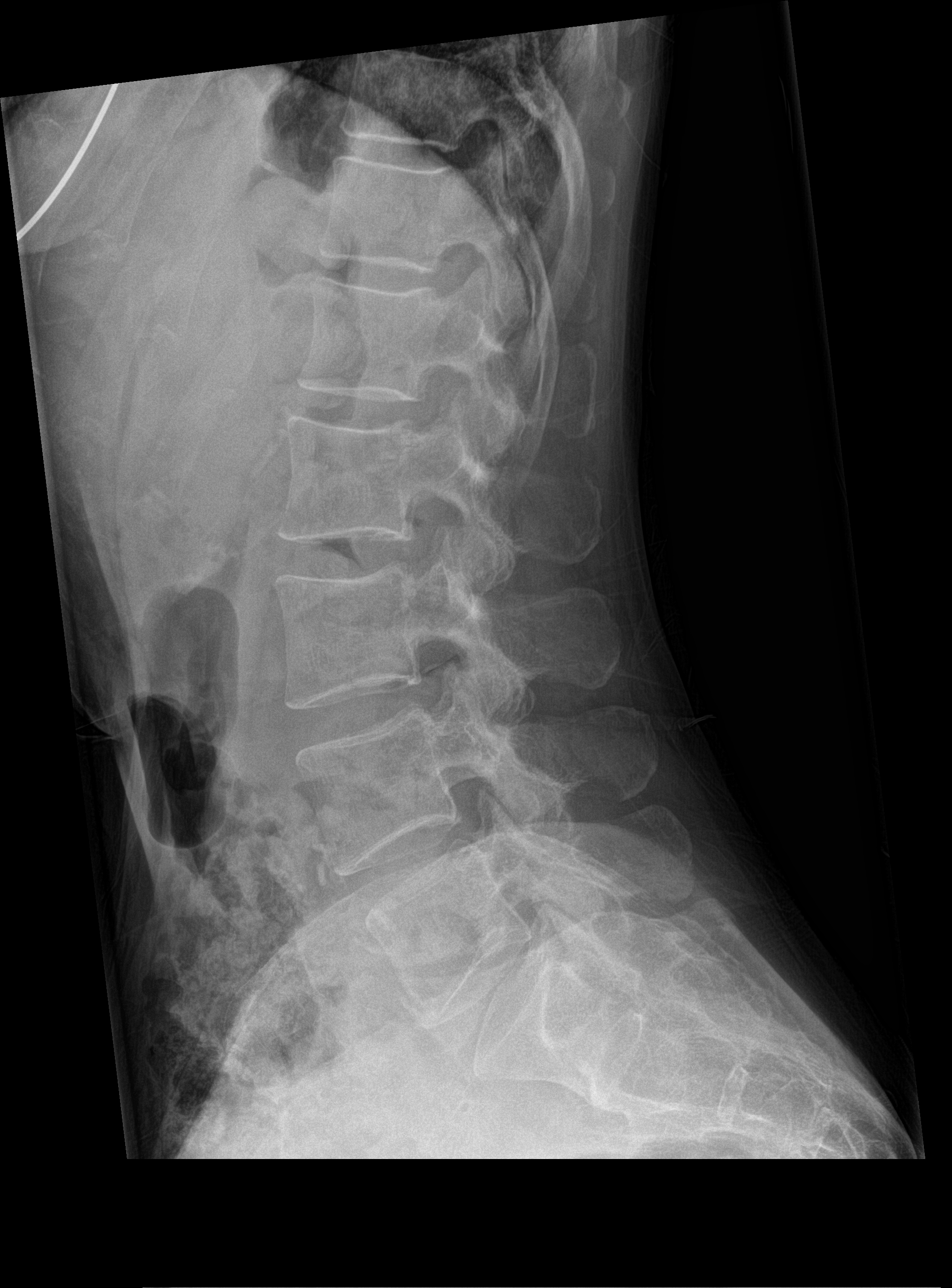

[l-spine spot]
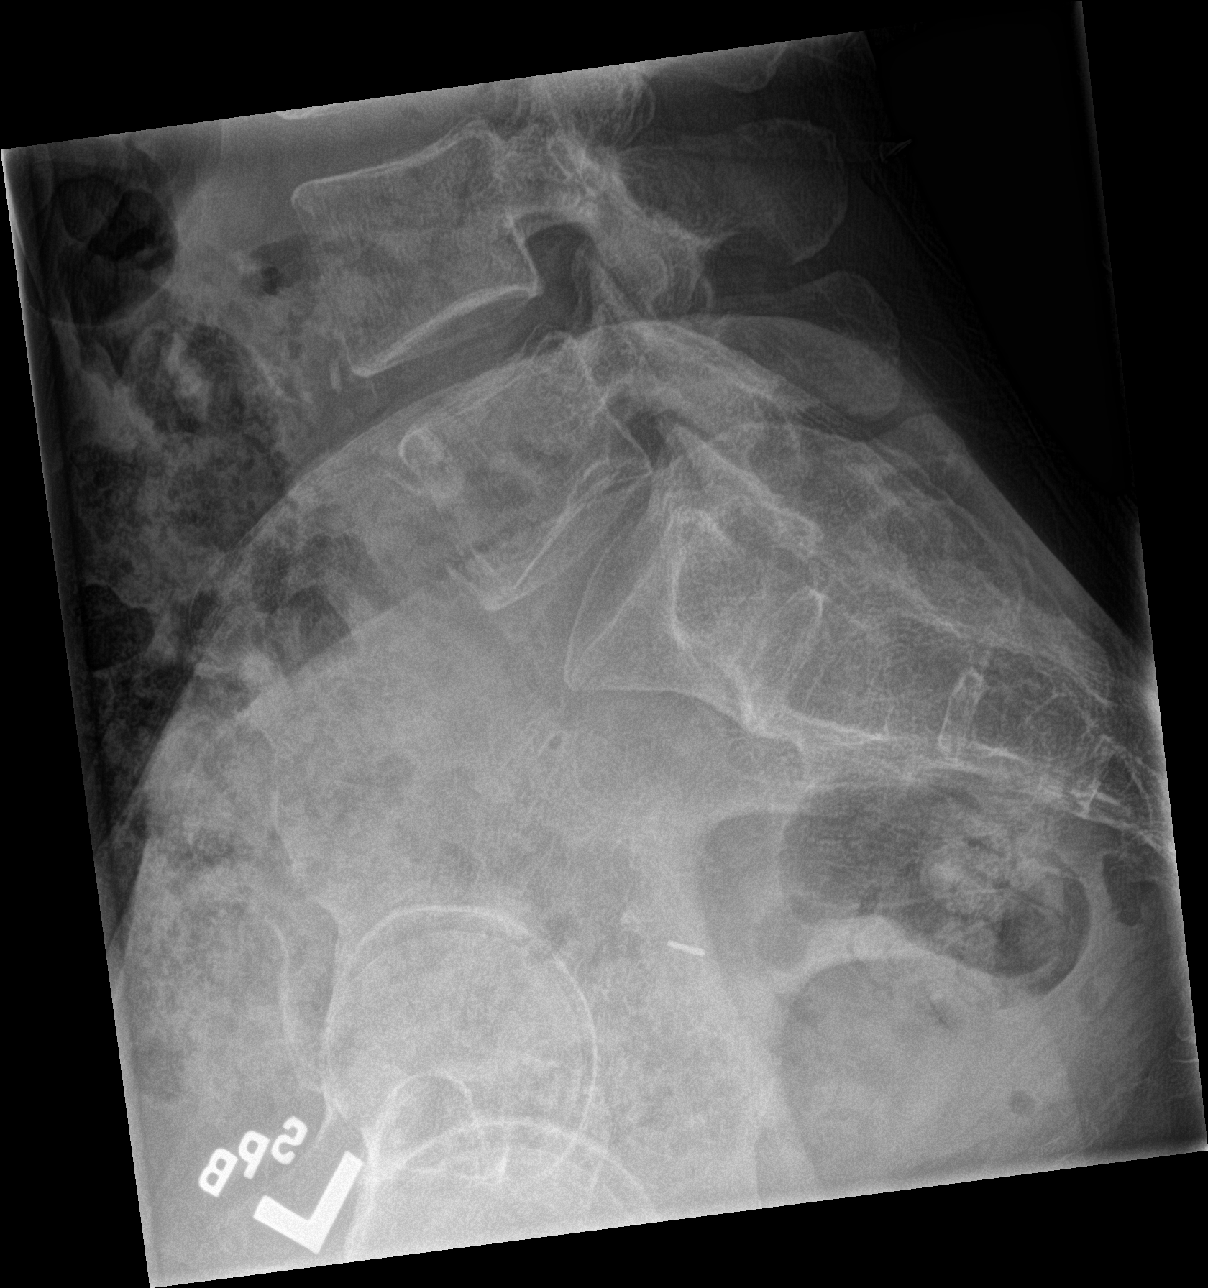

[5 of 5 positions shown; findings below may reference images not displayed]

FINDINGS: Frontal, lateral, spot lumbosacral lateral, and bilateral oblique
views were obtained. The there are 5 non-rib-bearing lumbar type
vertebral bodies. There is no fracture or spondylolisthesis. Disc
spaces appear intact. There is no appreciable facet arthropathy.
IMPRESSION: No fracture or spondylolisthesis. No appreciable arthropathic
change.

## 2016-01-04 ENCOUNTER — Telehealth: Payer: Self-pay | Admitting: Behavioral Health

## 2016-01-04 ENCOUNTER — Telehealth: Payer: Self-pay | Admitting: Medical

## 2016-01-04 ENCOUNTER — Ambulatory Visit: Payer: 59 | Admitting: Medical

## 2016-01-04 NOTE — Telephone Encounter (Signed)
Patient returned call to the office and reported that she's currently at the neighbor's house [facility]), but does not have accessible transportation to make the morning appointment. She voiced that her niece is calling around to see if she can find a way for her to be seen in office today. Re-iterated to the patient that if no one is available to get her here, call 911 to be transported to the Emergency Department for evaluation. She understood and did not have any further questions or concerns prior to call ending.

## 2016-01-04 NOTE — Telephone Encounter (Signed)
Patient reported having off and on suicidal and homicidal thoughts, as well as auditory & visual hallucinations. During the call the patient was very tearful and voiced that she feels if though her family does not want her around due to her inability to work. She stated, "My husband treats me like I'm a 55 year old child or a ghost walking around in my own home". Also, patient expressed that "she feels that her husband and daughter does not love her anymore, therefore she often have thoughts of not wanting to be here". Currently at this time, the patient denies having thoughts to harm herself or others; not hearing or seeing things that are not there. However, earlier in the conversation she did mention not wanting to be alone due to feeling like she sees someone walking down the hall at home or being afraid that she will do something. Writer inquired if there was a close relative or neighbor near by who she can be with right now and possibly bring her in to the office. Patient spoke of a close neighbor (two doors down from where she lives) and stated that she would go to her house once call was released and will see if she can bring her to the visit since there's no other transportation available. Appointment has been scheduled for 01/04/16 at 11:15 AM with Mackie Pai, PA-C. Advised patient that if the neighbor is unable to bring her to the office today, call 911 to be transported to the ED for evaluation. She verbalized understanding and reassured the writer that she would go to the neighbor's house right after this call has ended and confirm that she has transportation to get here today.

## 2016-01-04 NOTE — Telephone Encounter (Signed)
After reviewing note. If she were to show up here. She would need to be seen downstairs in ED or [facility] long ED. Would need to assess her transportation at that point.

## 2016-01-05 NOTE — Telephone Encounter (Signed)
Called to follow-up with the patient regarding the below conversation. Patient voiced that on yesterday evening she left the neighbor's house and went home to take her daily medications, which later made her drowsy, so she laid down instead of going to the Emergency Department. Today, she reports feeling much better and denies suicidal/homicidal ideation, as well as auditory/visual hallucinations. However, patient did address having an on-going cough for 3 weeks now. Appointment scheduled for 01/17/16 at 2:00 PM with PCP. Patient did not address any other concerns.

## 2016-01-10 ENCOUNTER — Encounter (HOSPITAL_BASED_OUTPATIENT_CLINIC_OR_DEPARTMENT_OTHER): Payer: Self-pay | Admitting: Emergency Medicine

## 2016-01-10 ENCOUNTER — Emergency Department (HOSPITAL_BASED_OUTPATIENT_CLINIC_OR_DEPARTMENT_OTHER): Payer: 59

## 2016-01-10 ENCOUNTER — Emergency Department (HOSPITAL_BASED_OUTPATIENT_CLINIC_OR_DEPARTMENT_OTHER)
Admission: EM | Admit: 2016-01-10 | Discharge: 2016-01-10 | Disposition: A | Discharge status: Home / Self Care | Payer: 59 | Attending: Emergency Medicine | Admitting: Emergency Medicine

## 2016-01-10 DIAGNOSIS — I10 Essential (primary) hypertension: Secondary | ICD-10-CM | POA: Insufficient documentation

## 2016-01-10 DIAGNOSIS — R55 Syncope and collapse: Secondary | ICD-10-CM | POA: Diagnosis present

## 2016-01-10 DIAGNOSIS — Z79899 Other long term (current) drug therapy: Secondary | ICD-10-CM | POA: Diagnosis not present

## 2016-01-10 DIAGNOSIS — R112 Nausea with vomiting, unspecified: Secondary | ICD-10-CM | POA: Diagnosis not present

## 2016-01-10 DIAGNOSIS — E860 Dehydration: Secondary | ICD-10-CM | POA: Insufficient documentation

## 2016-01-10 HISTORY — DX: Conversion disorder with seizures or convulsions: F44.5

## 2016-01-10 HISTORY — DX: Unspecified convulsions: R56.9

## 2016-01-10 LAB — COMPREHENSIVE METABOLIC PANEL
ALT: 11 U/L — ABNORMAL LOW (ref 14–54)
AST: 20 U/L (ref 15–41)
Albumin: 4.4 g/dL (ref 3.5–5.0)
Alkaline Phosphatase: 46 U/L (ref 38–126)
Anion gap: 7 (ref 5–15)
BUN: 12 mg/dL (ref 6–20)
CO2: 24 mmol/L (ref 22–32)
Calcium: 9.4 mg/dL (ref 8.9–10.3)
Chloride: 107 mmol/L (ref 101–111)
Creatinine, Ser: 0.63 mg/dL (ref 0.44–1.00)
GFR calc Af Amer: >60 mL/min (ref >60)
GFR calc non Af Amer: >60 mL/min (ref >60)
Glucose, Bld: 88 mg/dL (ref 65–99)
Potassium: 4.2 mmol/L (ref 3.5–5.1)
Sodium: 138 mmol/L (ref 135–145)
Total Bilirubin: 0.4 mg/dL (ref 0.3–1.2)
Total Protein: 7.9 g/dL (ref 6.5–8.1)

## 2016-01-10 LAB — CBC WITH DIFFERENTIAL/PLATELET
Basophils Absolute: 0 10*3/uL (ref 0.0–0.1)
Basophils Relative: 0 %
Eosinophils Absolute: 0.1 10*3/uL (ref 0.0–0.7)
Eosinophils Relative: 2 %
HCT: 39.2 % (ref 36.0–46.0)
Hemoglobin: 13.6 g/dL (ref 12.0–15.0)
Lymphocytes Relative: 37 %
Lymphs Abs: 3 10*3/uL (ref 0.7–4.0)
MCH: 27.9 pg (ref 26.0–34.0)
MCHC: 34.7 g/dL (ref 30.0–36.0)
MCV: 80.3 fL (ref 78.0–100.0)
Monocytes Absolute: 0.6 10*3/uL (ref 0.1–1.0)
Monocytes Relative: 7 %
Neutro Abs: 4.4 10*3/uL (ref 1.7–7.7)
Neutrophils Relative %: 54 %
Platelets: 274 10*3/uL (ref 150–400)
RBC: 4.88 MIL/uL (ref 3.87–5.11)
RDW: 14.6 % (ref 11.5–15.5)
WBC: 8.2 10*3/uL (ref 4.0–10.5)

## 2016-01-10 LAB — URINALYSIS, ROUTINE W REFLEX MICROSCOPIC
Bilirubin Urine: NEGATIVE
Glucose, UA: NEGATIVE mg/dL
Ketones, ur: NEGATIVE mg/dL
Leukocytes, UA: NEGATIVE
Nitrite: NEGATIVE
Protein, ur: NEGATIVE mg/dL
Specific Gravity, Urine: 1.015 (ref 1.005–1.030)
pH: 6 (ref 5.0–8.0)

## 2016-01-10 LAB — RAPID URINE DRUG SCREEN, HOSP PERFORMED
Amphetamines: NOT DETECTED
Barbiturates: NOT DETECTED
Benzodiazepines: POSITIVE — AB
Cocaine: NOT DETECTED
Opiates: NOT DETECTED
Tetrahydrocannabinol: POSITIVE — AB

## 2016-01-10 LAB — URINE MICROSCOPIC-ADD ON

## 2016-01-10 LAB — TROPONIN I: Troponin I: <0.03 ng/mL (ref <0.03)

## 2016-01-10 LAB — ETHANOL: Alcohol, Ethyl (B): <5 mg/dL (ref <5)

## 2016-01-10 MED ORDER — LORAZEPAM 2 MG/ML IJ SOLN
INTRAMUSCULAR | Status: AC
  Administered 2016-01-10: 1 mg via INTRAVENOUS
  Filled 2016-01-10: qty 1

## 2016-01-10 MED ORDER — LORAZEPAM 2 MG/ML IJ SOLN
1.0000 mg | INTRAMUSCULAR | Status: AC
  Administered 2016-01-10: 1 mg via INTRAVENOUS

## 2016-01-10 MED ORDER — KETOROLAC TROMETHAMINE 30 MG/ML IJ SOLN
15.0000 mg | Freq: Once | INTRAMUSCULAR | Status: AC
  Administered 2016-01-10: 15 mg via INTRAVENOUS
  Filled 2016-01-10: qty 1

## 2016-01-10 MED ORDER — SODIUM CHLORIDE 0.9 % IV SOLN: Freq: Once | INTRAVENOUS | Status: DC

## 2016-01-10 MED ORDER — SODIUM CHLORIDE 0.9 % IV BOLUS (SEPSIS)
1000.0000 mL | Freq: Once | INTRAVENOUS | Status: AC
Start: 2016-01-10 — End: 2016-01-10
  Administered 2016-01-10: 1000 mL via INTRAVENOUS

## 2016-01-10 MED ORDER — ONDANSETRON 4 MG PO TBDP: 4.0000 mg | ORAL_TABLET | Freq: Three times a day (TID) | ORAL | 0 refills | Status: DC | PRN

## 2016-01-10 NOTE — ED Notes (Signed)
Patient transported to CT 

## 2016-01-10 NOTE — ED Notes (Signed)
Patient awake, alert and oriented. The patient answering questions with appropriate answers  and getting dressed. Daughter at bedside and patient calm and cooperative.

## 2016-01-10 NOTE — ED Triage Notes (Signed)
Pt went to MD office and had syncopal episode as well as seizure activity for 1-2 minutes.  Pt has not been eating well, has been vomiting for one week.  Pt has been overdoing it according to her husband.

## 2016-01-10 NOTE — Telephone Encounter (Signed)
RN note reviewed. Agree with documention and plan. 

## 2016-01-10 NOTE — ED Provider Notes (Signed)
[place] DEPT [facility] Provider Note   CSN: [id number] Arrival date & time: 01/10/16  1443     History   Chief Complaint Chief Complaint  Patient presents with  . Loss of Consciousness  . Seizures    HPI Ruth Bell is a 55 y.o. female.  HPI    Ruth Bell is a 55 y.o. female, with a history of Anxiety, anemia, pseudoseizures, and hypertension, presenting to the ED with nausea and vomiting with poor oral intake for the last week. Productive cough also for the last week. Patient had 2 syncopal episodes today. She was at her PCPs office when she had a syncopal episode and a reported seizure. RN from the ED to evaluate the patient immediately after her "seizure" and found the patient to be immediately responsive with no postictal period. Patient had a second syncopal episode when she was stood up in the radiology department. Patient reportedly did not hit her head.  When spoken to privately, patient's daughter states that the patient has been evaluated for these same symptoms multiple times and thinks she does a lot of this for attention. She has had many complete work ups with no abnormalities found.   Patient denies fever/chills, diarrhea, abdominal pain, chest pain, shortness of breath, or any other complaints.   Past Medical History:  Diagnosis Date  . Anemia   . Anxiety   . Arm skin lesion, left 09/05/2014  . Constipation 12/27/2013  . Depression   . Eczema 08/22/2015  . GERD (gastroesophageal reflux disease)   . Hyperglycemia 01/15/2015  . Hyperlipidemia, mixed 08/22/2015  . Hypertension   . Menometrorrhagia 2006  . Migraine, unspecified, without mention of intractable migraine without mention of status migrainosus   . Pseudoseizures   . Seizures ([place])    history of pseudoseizure disorder, last last seizure 3 wks, keppra inc in dosage  . Tubular adenoma of colon 05/2011  . Uterine leiomyoma 2006    Patient Active Problem List   Diagnosis Date Noted  . Eczema  08/22/2015  . Hyperlipidemia, mixed 08/22/2015  . Hyperglycemia 01/15/2015  . Lumbar radiculopathy 11/19/2014  . Arm skin lesion, left 09/05/2014  . Noncompliance with medications 08/11/2014  . Constipation 12/27/2013  . Pain in joint, shoulder region 10/13/2013  . Shoulder pain 09/23/2013  . Numbness and tingling in right hand 09/23/2013  . Jaw pain 03/26/2013  . Left leg weakness 07/03/2012  . Abdominal pain, right upper quadrant 05/17/2012  . Neck pain 05/17/2012  . Seizure disorder ([place]) 02/21/2012  . Non compliance w medication regimen 02/21/2012  . HTN (hypertension) 09/06/2011  . Hemorrhoids 06/20/2011  . Hx of adenomatous colonic polyps 06/13/2011  . Musculoskeletal pain 04/23/2011  . Radiculopathy of leg 02/02/2011  . History of pseudoseizure 07/10/2010  . Anxiety and depression 12/29/2009  . TINEA PEDIS 06/02/2009  . MICROSCOPIC HEMATURIA 06/24/2007  . WEIGHT LOSS 04/28/2007  . ANEMIA 03/10/2007  . Migraine 03/10/2007  . ARTHRITIS 03/10/2007  . SYNCOPE 03/10/2007    Past Surgical History:  Procedure Laterality Date  . ABDOMINAL HYSTERECTOMY  04/06/04  . APPENDECTOMY    . BILATERAL SALPINGOOPHORECTOMY  04/06/04  . MASS EXCISION Left 06/02/2015   Procedure: EXCISION LEFT ARM MASS;  Surgeon: Autumn Messing III, MD;  Location: [place];  Service: General;  Laterality: Left;  . ROTATOR CUFF REPAIR      OB History    No data available       Home Medications    Prior to  Admission medications   Medication Sig Start Date End Date Taking? Authorizing Provider  ALPRAZolam Duanne Moron) 0.5 MG tablet Take 1 tablet (0.5 mg total) by mouth 2 (two) times daily as needed for anxiety. 05/26/15   Mosie Lukes, MD  Azelastine HCl 0.15 % SOLN Place 2 sprays into both nostrils 2 (two) times daily. 12/09/15   Chelle Jeffery, PA-C  benzonatate (TESSALON) 100 MG capsule Take 1-2 capsules (100-200 mg total) by mouth 3 (three) times daily as needed for cough. 12/09/15   Chelle  Jeffery, PA-C  citalopram (CELEXA) 20 MG tablet TAKE 1 TABLET (20 MG TOTAL) BY MOUTH DAILY. 09/16/15   Mosie Lukes, MD  cyclobenzaprine (FLEXERIL) 10 MG tablet Take 1 tablet (10 mg total) by mouth 3 (three) times daily as needed for muscle spasms. 02/21/15   Mosie Lukes, MD  famciclovir (FAMVIR) 500 MG tablet Take 1 tablet (500 mg total) by mouth 3 (three) times daily. 08/11/15   Percell Miller Saguier, PA-C  gabapentin (NEURONTIN) 600 MG tablet TAKE 1 TABLET (600 MG TOTAL) BY MOUTH 3 (THREE) TIMES DAILY. REPORTED ON 05/26/2015 10/20/15   Mosie Lukes, MD  Guaifenesin [facility] MAXIMUM STRENGTH) 1200 MG TB12 Take 1 tablet (1,200 mg total) by mouth every 12 (twelve) hours as needed. 12/09/15   Chelle Jeffery, PA-C  hydrOXYzine (ATARAX/VISTARIL) 10 MG tablet Take 1 tablet (10 mg total) by mouth every 8 (eight) hours as needed for itching. 08/01/15   Percell Miller Saguier, PA-C  ibuprofen (ADVIL,MOTRIN) 200 MG tablet Take 400 mg by mouth every 6 (six) hours as needed for mild pain or moderate pain. Reported on 07/27/2015    Historical Provider, MD  levETIRAcetam (KEPPRA) 500 MG tablet TAKE 1 TABLET (500 MG TOTAL) BY MOUTH TWO (2) TIMES A DAY. 03/21/15   Historical Provider, MD  metoCLOPramide (REGLAN) 10 MG tablet Take 1 tablet (10 mg total) by mouth every 8 (eight) hours as needed for nausea (nausea/headache). 08/12/15   Orlie Dakin, MD  metoprolol tartrate (LOPRESSOR) 25 MG tablet Take 1 tablet (25 mg total) by mouth 2 (two) times daily. 05/26/15   Mosie Lukes, MD  ondansetron (ZOFRAN ODT) 4 MG disintegrating tablet Take 1 tablet (4 mg total) by mouth every 8 (eight) hours as needed for nausea or vomiting. 01/10/16   Shawnta Schlegel C Cer[employer] Zynda, PA-C  ondansetron (ZOFRAN ODT) 8 MG disintegrating tablet Take 1 tablet (8 mg total) by mouth every 8 (eight) hours as needed for nausea or vomiting. 07/28/15   Mackie Pai, PA-C  ondansetron (ZOFRAN ODT) 8 MG disintegrating tablet Take 1 tablet (8 mg total) by mouth every 8 (eight) hours as  needed for nausea or vomiting. 08/11/15   Mackie Pai, PA-C  oxyCODONE-acetaminophen (ROXICET) 5-325 MG tablet Take 1-2 tablets by mouth every 4 (four) hours as needed. 06/02/15   Autumn Messing III, MD  pantoprazole (PROTONIX) 40 MG tablet TAKE 1 TABLET (40 MG TOTAL) BY MOUTH DAILY. 05/30/15   Mosie Lukes, MD  ranitidine (ZANTAC) 150 MG tablet Take 1 tablet (150 mg total) by mouth 2 (two) times daily. 08/11/15   Percell Miller Saguier, PA-C  ranitidine (ZANTAC) 150 MG tablet Take 1 tablet (150 mg total) by mouth 2 (two) times daily. 08/22/15   Mosie Lukes, MD  triamcinolone cream (KENALOG) 0.1 % Apply 1 application topically 2 (two) times daily. 12/09/15   Harrison Mons, PA-C    Family History Family History  Problem Relation Age of Onset  . Heart disease Mother   . Heart  attack Mother   . Cancer Father     colon  . Hypertension Father   . Cancer Sister     brain  . Cancer Brother   . Hypertension Sister   . Cancer Sister     unsure of type  . Diabetes Sister     type 2  . Heart attack Sister   . Hypertension Brother   . Colon cancer Neg Hx     Social History Social History  Substance Use Topics  . Smoking status: Current Every Day Smoker    Packs/day: 0.50    Years: 40.00    Types: Cigarettes    Start date: 11/17/2013  . Smokeless tobacco: Never Used     Comment: 3-4 cigarettes daily  . Alcohol use No     Allergies   Review of patient's allergies indicates no known allergies.   Review of Systems Review of Systems  Constitutional: Negative for chills and fever.  All other systems reviewed and are negative.    Physical Exam Updated Vital Signs BP 167/99 (BP Location: Left Arm)   Pulse 69   Temp 98.3 F (36.8 C) (Oral)   Resp 18   Ht 5\' 5"  (1.651 m)   Wt 45.8 kg   SpO2 100%   BMI 16.81 kg/m   Physical Exam  Constitutional: She is oriented to person, place, and time. She appears well-developed and well-nourished. No distress.  HENT:  Head: Normocephalic and  atraumatic.  No oral trauma. Mucous membranes dry.  Eyes: Conjunctivae and EOM are normal. Pupils are equal, round, and reactive to light.  Neck: Normal range of motion. Neck supple.  Cardiovascular: Normal rate, regular rhythm, normal heart sounds and intact distal pulses.   Pulmonary/Chest: Effort normal and breath sounds normal. No respiratory distress.  Abdominal: Soft. There is no tenderness. There is no guarding.  Musculoskeletal: She exhibits no edema or tenderness.  Full ROM in all extremities and spine. No midline spinal tenderness.   Lymphadenopathy:    She has no cervical adenopathy.  Neurological: She is alert and oriented to person, place, and time.  No sensory deficits. Strength 5/5 in all extremities. No gait disturbance. Coordination intact. Cranial nerves III-XII grossly intact. No facial droop.   Skin: Skin is warm and dry. She is not diaphoretic.  Psychiatric: She has a normal mood and affect. Her behavior is normal.  Nursing note and vitals reviewed.    ED Treatments / Results  Labs (all labs ordered are listed, but only abnormal results are displayed) Labs Reviewed  COMPREHENSIVE METABOLIC PANEL - Abnormal; Notable for the following:       Result Value   ALT 11 (*)    All other components within normal limits  URINALYSIS, ROUTINE W REFLEX MICROSCOPIC (NOT AT [facility]) - Abnormal; Notable for the following:    Hgb urine dipstick SMALL (*)    All other components within normal limits  RAPID URINE DRUG SCREEN, HOSP PERFORMED - Abnormal; Notable for the following:    Benzodiazepines POSITIVE (*)    Tetrahydrocannabinol POSITIVE (*)    All other components within normal limits  URINE MICROSCOPIC-ADD ON - Abnormal; Notable for the following:    Squamous Epithelial / LPF 0-5 (*)    Bacteria, UA RARE (*)    All other components within normal limits  CBC WITH DIFFERENTIAL/PLATELET  ETHANOL  TROPONIN I  CBG MONITORING, ED    EKG  EKG  Interpretation  Date/Time:  Tuesday January 10 2016 16:10:59 EDT Ventricular  Rate:  69 PR Interval:    QRS Duration: 91 QT Interval:  408 QTC Calculation: 438 R Axis:   72 Text Interpretation:  Sinus rhythm Right atrial enlargement Unchanged from 01/10/2016 Confirmed by DELO  MD, DOUGLAS ([id number]) on 01/10/2016 6:14:34 PM       Radiology Dg Chest 2 View  Result Date: 01/10/2016 CLINICAL DATA:  55 year old with chronic 4 week history of shortness of breath and headache. EXAM: CHEST  2 VIEW COMPARISON:  12/09/2015, 08/12/2015 and earlier, including CT chest 12/29/2009. FINDINGS: Cardiac silhouette normal in size, unchanged. Thoracic aorta mildly atherosclerotic, unchanged. Hilar and mediastinal contours otherwise unremarkable. Mild hyperinflation, unchanged. Faint nodule in the right lung apex, unchanged from the prior CT in 2011 and therefore benign. Lungs otherwise clear. Bronchovascular markings normal. No localized airspace consolidation. No pleural effusions. No pneumothorax. Normal pulmonary vascularity. Visualized bony thorax intact. IMPRESSION: 1.  No acute cardiopulmonary disease. 2. Stable mild hyperinflation indicating COPD and/or asthma. 3. Stable mild thoracic aortic atherosclerosis. Electronically Signed   By: Evangeline Dakin M.D.   On: 01/10/2016 16:34   Ct Head Wo Contrast  Result Date: 01/10/2016 CLINICAL DATA:  Initial evaluation for acute trauma, fall, syncope. EXAM: CT HEAD WITHOUT CONTRAST CT CERVICAL SPINE WITHOUT CONTRAST TECHNIQUE: Multidetector CT imaging of the head and cervical spine was performed following the standard protocol without intravenous contrast. Multiplanar CT image reconstructions of the cervical spine were also generated. COMPARISON:  None available. FINDINGS: CT HEAD FINDINGS Brain: Cerebral volume normal. No evidence for acute intracranial hemorrhage or infarct. No mass lesion, midline shift, or mass effect. No hydrocephalus. No extra-axial fluid  collection. Vascular: No hyperdense vessel. Skull: Scalp soft tissues within normal limits.  Calvarium intact. Sinuses/Orbits: Visualized globes and orbits within normal limits. Visualized paranasal sinuses and mastoid air cells are clear. CT CERVICAL SPINE FINDINGS Alignment: Straightening with smooth reversal the normal cervical lordosis. Trace anterolisthesis of C3 on C4, C4 on C5, and C7 on T1, likely chronic due to underlying degenerative facet disease. Skull base and vertebrae: Skullbase intact. Normal C1-2 articulations are preserved. Dens is intact. Vertebral body heights preserved. No acute fracture. Soft tissues and spinal canal: Visualized soft tissues of the neck demonstrate no acute abnormality. Vascular calcifications noted about the carotid bifurcations. Disc levels: Multilevel facet arthrosis noted, most nodal at C3-4 on the left and C4-5 on the right. Upper chest: Visualized lungs are clear without acute process. IMPRESSION: CT BRAIN: Negative head CT.  No acute intracranial process identified. CT CERVICAL SPINE: 1. No acute traumatic injury within the cervical spine. 2. Trace anterolisthesis of C3 on C4, C4 on C5, and C7 on T1, likely due to underlying chronic facet degeneration. Electronically Signed   By: Jeannine Boga M.D.   On: 01/10/2016 17:33   Ct Cervical Spine Wo Contrast  Result Date: 01/10/2016 CLINICAL DATA:  Initial evaluation for acute trauma, fall, syncope. EXAM: CT HEAD WITHOUT CONTRAST CT CERVICAL SPINE WITHOUT CONTRAST TECHNIQUE: Multidetector CT imaging of the head and cervical spine was performed following the standard protocol without intravenous contrast. Multiplanar CT image reconstructions of the cervical spine were also generated. COMPARISON:  None available. FINDINGS: CT HEAD FINDINGS Brain: Cerebral volume normal. No evidence for acute intracranial hemorrhage or infarct. No mass lesion, midline shift, or mass effect. No hydrocephalus. No extra-axial fluid  collection. Vascular: No hyperdense vessel. Skull: Scalp soft tissues within normal limits.  Calvarium intact. Sinuses/Orbits: Visualized globes and orbits within normal limits. Visualized paranasal sinuses and mastoid air cells are  clear. CT CERVICAL SPINE FINDINGS Alignment: Straightening with smooth reversal the normal cervical lordosis. Trace anterolisthesis of C3 on C4, C4 on C5, and C7 on T1, likely chronic due to underlying degenerative facet disease. Skull base and vertebrae: Skullbase intact. Normal C1-2 articulations are preserved. Dens is intact. Vertebral body heights preserved. No acute fracture. Soft tissues and spinal canal: Visualized soft tissues of the neck demonstrate no acute abnormality. Vascular calcifications noted about the carotid bifurcations. Disc levels: Multilevel facet arthrosis noted, most nodal at C3-4 on the left and C4-5 on the right. Upper chest: Visualized lungs are clear without acute process. IMPRESSION: CT BRAIN: Negative head CT.  No acute intracranial process identified. CT CERVICAL SPINE: 1. No acute traumatic injury within the cervical spine. 2. Trace anterolisthesis of C3 on C4, C4 on C5, and C7 on T1, likely due to underlying chronic facet degeneration. Electronically Signed   By: Jeannine Boga M.D.   On: 01/10/2016 17:33    Procedures Procedures (including critical care time)  Medications Ordered in ED Medications  0.9 %  sodium chloride infusion ( Intravenous Not Given 01/10/16 1835)  LORazepam (ATIVAN) injection 1 mg (1 mg Intravenous Given 01/10/16 1603)  sodium chloride 0.9 % bolus 1,000 mL (0 mLs Intravenous Stopped 01/10/16 1751)  ketorolac (TORADOL) 30 MG/ML injection 15 mg (15 mg Intravenous Given 01/10/16 1934)     Initial Impression / Assessment and Plan / ED Course  I have reviewed the triage vital signs and the nursing notes.  Pertinent labs & imaging results that were available during my care of the patient were reviewed by me and  considered in my medical decision making (see chart for details).  Clinical Course     Patient presents with nausea, vomiting, and 2 syncopal episodes.  Patient has pseudoseizures. Patient was assessed during one of her episodes of shaking while she was shaking and found to moan in response to stimuli. Patient maintained control of her airway during the entire episode. No oral trauma. No postictal period. No incontinence. Ativan given due to suspected anxiety component. Due to her initial complaint of a headache and 2 syncopal episodes in a short period of time, a CT was obtained. CT was normal. No significant abnormalities on the patient's labs. Patient is nontoxic appearing, afebrile, not tachycardic, not tachypneic, not hypotensive, maintains SPO2 of 100% on room air, and is in no apparent distress. Patient has no signs of sepsis or other serious or life-threatening condition. The patient was given instructions for home care as well as return precautions. Patient voices understanding of these instructions, accepts the plan, and is comfortable with discharge.  Findings and plan of care discussed with Veryl Speak, MD. Dr. Stark Jock personally evaluated and examined this patient.  Vitals:   01/10/16 1830 01/10/16 1900 01/10/16 1930 01/10/16 2000  BP: 156/98 (!) 184/101 (!) 166/106 163/88  Pulse: (!) 57 64 (!) 58 67  Resp: 17 21 19 18   Temp:      TempSrc:      SpO2: 100% 100% 100% 100%  Weight:      Height:         Final Clinical Impressions(s) / ED Diagnoses   Final diagnoses:  Dehydration  Non-intractable vomiting with nausea, unspecified vomiting type    New Prescriptions Discharge Medication List as of 01/10/2016  7:57 PM    START taking these medications   Details  !! ondansetron (ZOFRAN ODT) 4 MG disintegrating tablet Take 1 tablet (4 mg total) by mouth every 8 (  eight) hours as needed for nausea or vomiting., Starting Tue 01/10/2016, Print     !! - Potential duplicate  medications found. Please discuss with provider.       Lorayne Bender, PA-C 01/10/16 2219    Veryl Speak, MD 01/10/16 2259

## 2016-01-10 NOTE — ED Notes (Signed)
[place] to room by Emt  - patient laying on her back with full body shaking. The patient was being held on her left side by her husband. Patient is non responsive to the RN. Patient lifted onto her right side. Patient remains on the monitor during the episode and airway in tact. Episode lasted about 3 - 5 minutes. Arlean Hopping, PA to the bedside as well as 2 nurses and 1 emt. Suction at the head of the bed, patients mouth suctioned after coughing. The patient gaging at time but 02 remained at 100% and patients Skin Color within normal limits through the episode. RN's at bedside continually x 30 minutes.

## 2016-01-10 NOTE — Discharge Instructions (Signed)
You have been seen today for nausea, vomiting, and a cough, along with a syncopal episode. Your imaging and lab tests showed no abnormalities. It is likely, however, that you are dehydrated. You must have regular intake of food and drink to avoid these symptoms. It is recommended that you stop smoking. Follow up with PCP for reevaluation as soon as possible. Return to ED as needed.  Is also recommended that you take your medications as prescribed.

## 2016-01-10 NOTE — ED Notes (Signed)
[  patient wakes up and answers questions appropriately

## 2016-01-10 NOTE — ED Notes (Signed)
Patient was taken to x-ray in stretcher. While in X-ray was called over for emergency. On arrival patient was on floor unresponsive. Per X-ray staff patient "passed out". The patient has shallow respirations, strong Radial pulse. Patient responsive to pain. No shaking or general movements. Patient assisted back to bed with maximal assist and patient brought back to room 10. Patient awakens to voice and murmuring to the staff while enroute to room 10

## 2016-01-10 NOTE — Telephone Encounter (Signed)
Patient walked into the clinic today accompanied by her husband, under the impression that she had an appointment with her PCP, but the visit was actually scheduled for the following week. Writer spoke with Dr. Charlett Blake regarding the misunderstanding of appointment times and agreed that she would see the patient as time allots in the afternoon schedule however, it may be a long wait, therefore advised that RN assess patient to see if she could wait to be seen or needed to go to the ER. Patient was brought into the treatment room for assessment. She reported sweats, chills, productive cough--brown sputum,  hoarseness, chest congestion, slight shortness of breath, headache on left side of head rated10/10, along with blurred vision and tingling on left side. Chest congestion started 4 weeks ago. Headaches started 3 days ago.    Just last week, patient called the office due to suicidal thoughts. Today, she denies having suicidal or homicidal ideation, voicing the last time was this past Thursday. Per the patient's spouse, she have these thoughts when it's the anniversary of her mother's death or anyone that's close to her that has passed. Also, patient stated that she's not able to contribute to the household in the way she would like because she was written out of work, so this upsets her. Patient became tearful as the conversation progressed. Shortly afterwards the patient sneezed twice and passed out for about 8 minutes. Patient had strong pulse and was breathing, but was unresponsive to the call of her name and vigorous sternal rubs. An ammonia tablet was placed at the patient's nostrils to bring her back to consciousness. This initially did not work, however after 30 seconds, she became alert again, but disoriented x 3, along with dry heaving. Patient's PCP was made aware and came into the room to further assess patient and to inform her that she's being sent to the ED on the first floor here in the building. It was  at this time that the patient began to go into a generalized seizure, just after being placed in a wheelchair. Once seizure ended, the patient was transported by stretcher by ER nurses instead. Husband was still present and went downstairs with the patient.

## 2016-01-13 ENCOUNTER — Other Ambulatory Visit: Payer: Self-pay

## 2016-01-13 MED ORDER — RANITIDINE HCL 150 MG PO TABS
150.0000 mg | ORAL_TABLET | Freq: Two times a day (BID) | ORAL | 0 refills | Status: DC
Start: 2016-01-13 — End: 2016-01-17

## 2016-01-13 MED ORDER — ONDANSETRON 4 MG PO TBDP: 4.0000 mg | ORAL_TABLET | Freq: Three times a day (TID) | ORAL | 0 refills | Status: DC | PRN

## 2016-01-13 NOTE — Telephone Encounter (Signed)
Faxed hardcopy for zofran to pharmacy

## 2016-01-16 ENCOUNTER — Other Ambulatory Visit: Payer: Self-pay | Admitting: Family Medicine

## 2016-01-16 MED ORDER — GABAPENTIN 600 MG PO TABS: 600.0000 mg | ORAL_TABLET | Freq: Three times a day (TID) | ORAL | 1 refills | Status: DC

## 2016-01-16 MED ORDER — FAMCICLOVIR 500 MG PO TABS: 500.0000 mg | ORAL_TABLET | Freq: Three times a day (TID) | ORAL | 0 refills | Status: DC

## 2016-01-16 MED ORDER — PANTOPRAZOLE SODIUM 40 MG PO TBEC: DELAYED_RELEASE_TABLET | ORAL | 2 refills | Status: DC

## 2016-01-16 MED ORDER — CITALOPRAM HYDROBROMIDE 20 MG PO TABS: ORAL_TABLET | ORAL | 2 refills | Status: DC

## 2016-01-16 NOTE — Telephone Encounter (Signed)
Patient requesting these to go to mail order for #90 day supply

## 2016-01-17 ENCOUNTER — Encounter: Payer: Self-pay | Admitting: Family Medicine

## 2016-01-17 ENCOUNTER — Ambulatory Visit (INDEPENDENT_AMBULATORY_CARE_PROVIDER_SITE_OTHER): Payer: 59 | Admitting: Family Medicine

## 2016-01-17 VITALS — BP 152/84 | HR 68 | Temp 98.7°F | Ht 65.0 in | Wt 101.1 lb

## 2016-01-17 DIAGNOSIS — R51 Headache: Secondary | ICD-10-CM

## 2016-01-17 DIAGNOSIS — F418 Other specified anxiety disorders: Secondary | ICD-10-CM

## 2016-01-17 DIAGNOSIS — F32A Depression, unspecified: Secondary | ICD-10-CM

## 2016-01-17 DIAGNOSIS — R569 Unspecified convulsions: Secondary | ICD-10-CM | POA: Diagnosis not present

## 2016-01-17 DIAGNOSIS — R519 Headache, unspecified: Secondary | ICD-10-CM

## 2016-01-17 DIAGNOSIS — Z8619 Personal history of other infectious and parasitic diseases: Secondary | ICD-10-CM

## 2016-01-17 DIAGNOSIS — M542 Cervicalgia: Secondary | ICD-10-CM | POA: Diagnosis not present

## 2016-01-17 DIAGNOSIS — G40909 Epilepsy, unspecified, not intractable, without status epilepticus: Secondary | ICD-10-CM

## 2016-01-17 DIAGNOSIS — F329 Major depressive disorder, single episode, unspecified: Secondary | ICD-10-CM

## 2016-01-17 DIAGNOSIS — R55 Syncope and collapse: Secondary | ICD-10-CM | POA: Diagnosis not present

## 2016-01-17 DIAGNOSIS — I10 Essential (primary) hypertension: Secondary | ICD-10-CM

## 2016-01-17 DIAGNOSIS — F419 Anxiety disorder, unspecified: Secondary | ICD-10-CM

## 2016-01-17 DIAGNOSIS — J209 Acute bronchitis, unspecified: Secondary | ICD-10-CM

## 2016-01-17 DIAGNOSIS — F333 Major depressive disorder, recurrent, severe with psychotic symptoms: Secondary | ICD-10-CM

## 2016-01-17 MED ORDER — KETOROLAC TROMETHAMINE 30 MG/ML IJ SOLN
30.0000 mg | Freq: Once | INTRAMUSCULAR | Status: AC
  Administered 2016-01-17: 30 mg via INTRAVENOUS

## 2016-01-17 MED ORDER — QUETIAPINE FUMARATE 100 MG PO TABS: ORAL_TABLET | ORAL | 1 refills | Status: DC

## 2016-01-17 MED ORDER — CEFDINIR 300 MG PO CAPS: 300.0000 mg | ORAL_CAPSULE | Freq: Two times a day (BID) | ORAL | 0 refills | Status: DC

## 2016-01-17 MED ORDER — METOPROLOL TARTRATE 50 MG PO TABS: 50.0000 mg | ORAL_TABLET | Freq: Two times a day (BID) | ORAL | 3 refills | Status: DC

## 2016-01-17 NOTE — Progress Notes (Signed)
Pre visit review using our clinic review tool, if applicable. No additional management support is needed unless otherwise documented below in the visit note. 

## 2016-01-17 NOTE — Patient Instructions (Signed)

## 2016-01-18 ENCOUNTER — Other Ambulatory Visit: Payer: Self-pay

## 2016-01-18 DIAGNOSIS — R05 Cough: Secondary | ICD-10-CM

## 2016-01-18 DIAGNOSIS — R059 Cough, unspecified: Secondary | ICD-10-CM

## 2016-01-18 HISTORY — PX: TRANSTHORACIC ECHOCARDIOGRAM: SHX275

## 2016-01-18 MED ORDER — BENZONATATE 100 MG PO CAPS: 100.0000 mg | ORAL_CAPSULE | Freq: Three times a day (TID) | ORAL | 0 refills | Status: DC | PRN

## 2016-01-18 MED ORDER — ONDANSETRON 4 MG PO TBDP: 4.0000 mg | ORAL_TABLET | Freq: Three times a day (TID) | ORAL | 0 refills | Status: DC | PRN

## 2016-01-18 MED ORDER — ALPRAZOLAM 0.5 MG PO TABS: 0.5000 mg | ORAL_TABLET | Freq: Two times a day (BID) | ORAL | 0 refills | Status: DC | PRN

## 2016-01-18 NOTE — Telephone Encounter (Signed)
Rx for Ondansetron 4mg  was faxed to the [facility] on 01/18/16 per the Patient's request.

## 2016-01-18 NOTE — Telephone Encounter (Signed)
Last seen 01/17/16 Last filled 05/26/15 #60 -0 rf Please advise Pc

## 2016-01-19 NOTE — Telephone Encounter (Signed)
Faxed hardcopy for Alprazolam to [facility] [place]

## 2016-01-20 ENCOUNTER — Telehealth: Payer: Self-pay | Admitting: Family Medicine

## 2016-01-20 NOTE — Telephone Encounter (Signed)
Patient's husband called stating that he looked up what  famciclovir (FAMVIR) 500 MG tablet is for and said it is for treating and preventing herpes. He states that his wife does not have herpes and does not know why she is on this medication. Please advise.   Caller: Husband Phone: [phone]

## 2016-01-21 NOTE — Telephone Encounter (Signed)
It is also for Shingles and cold sores and she was originally given it 6 months ago, it can be given intermittently when a flare is thought to be happening or pain occurs

## 2016-01-22 ENCOUNTER — Encounter: Payer: Self-pay | Admitting: Family Medicine

## 2016-01-22 DIAGNOSIS — Z8619 Personal history of other infectious and parasitic diseases: Secondary | ICD-10-CM

## 2016-01-22 DIAGNOSIS — J209 Acute bronchitis, unspecified: Secondary | ICD-10-CM | POA: Insufficient documentation

## 2016-01-22 HISTORY — DX: Personal history of other infectious and parasitic diseases: Z86.19

## 2016-01-22 NOTE — Assessment & Plan Note (Signed)
Patient very agitated today and difficult to get an accurate history from unclear what meds she is taking but will rx Metoprolol 50 mg po bid.

## 2016-01-22 NOTE — Assessment & Plan Note (Signed)
Famcyclovir

## 2016-01-22 NOTE — Assessment & Plan Note (Signed)
Referred to psychiatry for further care and bipolar screen borderline positive will start Seroquel while she awaits psych.

## 2016-01-22 NOTE — Assessment & Plan Note (Signed)
Unclear etiology of seizure activity but also complaining of worsening headaches so will refer to neurology for further work up.

## 2016-01-22 NOTE — Progress Notes (Signed)
Patient ID: Ruth Bell, female   DOB: August 27, 1960, 55 y.o.   MRN: [id number]   Subjective:    Patient ID: Ruth Bell, female    DOB: Aug 08, 1960, 55 y.o.   MRN: [id number]  Chief Complaint  Patient presents with  . Cough  . Depression    HPI Patient is in today for follow up. She is very tearful and agitated today. She is not sure which meds she is actually taking. She endorses head congestion, cough, light headedness and malaise. She is noting depression and fatigue. Acknowledges anhedonia and anxiety. No suicidal ideation now but she reports that she has had some other moments. She reports episodes of seizure and syncope in last week. Denies palp/SOB/fevers/GI or GU c/o. Taking meds as prescribed  Past Medical History:  Diagnosis Date  . Anemia   . Anxiety   . Arm skin lesion, left 09/05/2014  . Constipation 12/27/2013  . Depression   . Eczema 08/22/2015  . GERD (gastroesophageal reflux disease)   . Hyperglycemia 01/15/2015  . Hyperlipidemia, mixed 08/22/2015  . Hypertension   . Menometrorrhagia 2006  . Migraine, unspecified, without mention of intractable migraine without mention of status migrainosus   . Pseudoseizures   . Seizures ([place])    history of pseudoseizure disorder, last last seizure 3 wks, keppra inc in dosage  . Tubular adenoma of colon 05/2011  . Uterine leiomyoma 2006    Past Surgical History:  Procedure Laterality Date  . ABDOMINAL HYSTERECTOMY  04/06/04  . APPENDECTOMY    . BILATERAL SALPINGOOPHORECTOMY  04/06/04  . MASS EXCISION Left 06/02/2015   Procedure: EXCISION LEFT ARM MASS;  Surgeon: Autumn Messing III, MD;  Location: [facility];  Service: General;  Laterality: Left;  . ROTATOR CUFF REPAIR      Family History  Problem Relation Age of Onset  . Heart disease Mother   . Heart attack Mother   . Cancer Father     colon  . Hypertension Father   . Cancer Sister     brain  . Cancer Brother   . Hypertension Sister   . Cancer Sister    unsure of type  . Diabetes Sister     type 2  . Heart attack Sister   . Hypertension Brother   . Colon cancer Neg Hx     Social History   Social History  . Marital status: Married    Spouse name: Gwyndolyn Saxon  . Number of children: N/A  . Years of education: N/A   Occupational History  . CMA [employer]   Social History Main Topics  . Smoking status: Current Every Day Smoker    Packs/day: 0.50    Years: 40.00    Types: Cigarettes    Start date: 11/17/2013  . Smokeless tobacco: Never Used     Comment: 3-4 cigarettes daily  . Alcohol use No  . Drug use: No  . Sexual activity: Not on file     Comment: lives with husband, no dietary restrictions.    Other Topics Concern  . Not on file   Social History Narrative   Lives with her husband and their son.   Daughter lives in [place], [place].    Outpatient Medications Prior to Visit  Medication Sig Dispense Refill  . Azelastine HCl 0.15 % SOLN Place 2 sprays into both nostrils 2 (two) times daily. 30 mL 0  . citalopram (CELEXA) 20 MG tablet TAKE 1 TABLET (20 MG TOTAL) BY MOUTH  DAILY. 90 tablet 2  . cyclobenzaprine (FLEXERIL) 10 MG tablet Take 1 tablet (10 mg total) by mouth 3 (three) times daily as needed for muscle spasms. 90 tablet 1  . famciclovir (FAMVIR) 500 MG tablet Take 1 tablet (500 mg total) by mouth 3 (three) times daily. 21 tablet 0  . gabapentin (NEURONTIN) 600 MG tablet Take 1 tablet (600 mg total) by mouth 3 (three) times daily. Reported on 05/26/2015 90 tablet 1  . Guaifenesin (MUCINEX MAXIMUM STRENGTH) 1200 MG TB12 Take 1 tablet (1,200 mg total) by mouth every 12 (twelve) hours as needed. 14 tablet 1  . hydrOXYzine (ATARAX/VISTARIL) 10 MG tablet Take 1 tablet (10 mg total) by mouth every 8 (eight) hours as needed for itching. 21 tablet 0  . levETIRAcetam (KEPPRA) 500 MG tablet TAKE 1 TABLET (500 MG TOTAL) BY MOUTH TWO (2) TIMES A DAY.  3  . oxyCODONE-acetaminophen (ROXICET) 5-325 MG tablet Take 1-2 tablets by  mouth every 4 (four) hours as needed. 30 tablet 0  . pantoprazole (PROTONIX) 40 MG tablet TAKE 1 TABLET (40 MG TOTAL) BY MOUTH DAILY. 90 tablet 2  . ranitidine (ZANTAC) 150 MG tablet Take 1 tablet (150 mg total) by mouth 2 (two) times daily. 60 tablet 2  . triamcinolone cream (KENALOG) 0.1 % Apply 1 application topically 2 (two) times daily. 45 g 0  . ALPRAZolam (XANAX) 0.5 MG tablet Take 1 tablet (0.5 mg total) by mouth 2 (two) times daily as needed for anxiety. 60 tablet 0  . benzonatate (TESSALON) 100 MG capsule Take 1-2 capsules (100-200 mg total) by mouth 3 (three) times daily as needed for cough. 40 capsule 0  . ibuprofen (ADVIL,MOTRIN) 200 MG tablet Take 400 mg by mouth every 6 (six) hours as needed for mild pain or moderate pain. Reported on 07/27/2015    . metoCLOPramide (REGLAN) 10 MG tablet Take 1 tablet (10 mg total) by mouth every 8 (eight) hours as needed for nausea (nausea/headache). 12 tablet 0  . metoprolol tartrate (LOPRESSOR) 25 MG tablet Take 1 tablet (25 mg total) by mouth 2 (two) times daily. 60 tablet 5  . ondansetron (ZOFRAN ODT) 4 MG disintegrating tablet Take 1 tablet (4 mg total) by mouth every 8 (eight) hours as needed for nausea or vomiting. 20 tablet 0  . ranitidine (ZANTAC) 150 MG tablet Take 1 tablet (150 mg total) by mouth 2 (two) times daily. 60 tablet 0   No facility-administered medications prior to visit.     No Known Allergies  Review of Systems  Constitutional: Positive for malaise/fatigue. Negative for fever.  HENT: Positive for congestion.   Eyes: Negative for blurred vision.  Respiratory: Positive for cough. Negative for shortness of breath.   Cardiovascular: Positive for chest pain. Negative for palpitations and leg swelling.  Gastrointestinal: Negative for abdominal pain, blood in stool and nausea.  Genitourinary: Negative for dysuria and frequency.  Musculoskeletal: Positive for back pain, falls, myalgias and neck pain.  Skin: Negative for rash.    Neurological: Positive for seizures and loss of consciousness. Negative for dizziness and headaches.  Endo/Heme/Allergies: Negative for environmental allergies.  Psychiatric/Behavioral: Positive for depression. The patient is nervous/anxious.        Objective:    Physical Exam  Constitutional: She is oriented to person, place, and time. She appears well-developed and well-nourished. No distress.  HENT:  Head: Normocephalic and atraumatic.  Nose: Nose normal.  Eyes: Right eye exhibits no discharge. Left eye exhibits no discharge.  Neck: Normal range  of motion. Neck supple.  Cardiovascular: Normal rate and regular rhythm.   No murmur heard. Pulmonary/Chest: Effort normal and breath sounds normal.  Abdominal: Soft. Bowel sounds are normal. There is no tenderness.  Musculoskeletal: She exhibits tenderness. She exhibits no edema.  Tension in SCM muscles b/l  Neurological: She is alert and oriented to person, place, and time.  Skin: Skin is warm and dry.  Psychiatric:  Patient very agitated and unsettled.  Nursing note and vitals reviewed.   BP (!) 152/84   Pulse 68   Temp 98.7 F (37.1 C) (Oral)   Ht 5\' 5"  (1.651 m)   Wt 101 lb 2 oz (45.9 kg)   SpO2 96%   BMI 16.83 kg/m  Wt Readings from Last 3 Encounters:  01/17/16 101 lb 2 oz (45.9 kg)  01/10/16 101 lb (45.8 kg)  12/09/15 101 lb 3.2 oz (45.9 kg)     Lab Results  Component Value Date   WBC 8.2 01/10/2016   HGB 13.6 01/10/2016   HCT 39.2 01/10/2016   PLT 274 01/10/2016   GLUCOSE 88 01/10/2016   CHOL 214 (H) 05/26/2015   TRIG 104.0 05/26/2015   HDL 59.50 05/26/2015   LDLCALC 134 (H) 05/26/2015   ALT 11 (L) 01/10/2016   AST 20 01/10/2016   NA 138 01/10/2016   K 4.2 01/10/2016   CL 107 01/10/2016   CREATININE 0.63 01/10/2016   BUN 12 01/10/2016   CO2 24 01/10/2016   TSH 1.02 05/26/2015   INR 1.0 11/19/2013   HGBA1C 6.6 (H) 05/26/2015    Lab Results  Component Value Date   TSH 1.02 05/26/2015   Lab  Results  Component Value Date   WBC 8.2 01/10/2016   HGB 13.6 01/10/2016   HCT 39.2 01/10/2016   MCV 80.3 01/10/2016   PLT 274 01/10/2016   Lab Results  Component Value Date   NA 138 01/10/2016   K 4.2 01/10/2016   CO2 24 01/10/2016   GLUCOSE 88 01/10/2016   BUN 12 01/10/2016   CREATININE 0.63 01/10/2016   BILITOT 0.4 01/10/2016   ALKPHOS 46 01/10/2016   AST 20 01/10/2016   ALT 11 (L) 01/10/2016   PROT 7.9 01/10/2016   ALBUMIN 4.4 01/10/2016   CALCIUM 9.4 01/10/2016   ANIONGAP 7 01/10/2016   GFR 127.67 07/28/2015   Lab Results  Component Value Date   CHOL 214 (H) 05/26/2015   Lab Results  Component Value Date   HDL 59.50 05/26/2015   Lab Results  Component Value Date   LDLCALC 134 (H) 05/26/2015   Lab Results  Component Value Date   TRIG 104.0 05/26/2015   Lab Results  Component Value Date   CHOLHDL 4 05/26/2015   Lab Results  Component Value Date   HGBA1C 6.6 (H) 05/26/2015       Assessment & Plan:   Problem List Items Addressed This Visit    Anxiety and depression    Referred to psychiatry for further care and bipolar screen borderline positive will start Seroquel while she awaits psych.      SYNCOPE    Reports several episodes of sysncope and other episodes of atypical chest discomfort. Due to the difficulty assessing this patients symptoms she is in need of a cardiology consult and testing. Will refer      Relevant Medications   metoprolol (LOPRESSOR) 50 MG tablet   HTN (hypertension)    Patient very agitated today and difficult to get an accurate history from unclear what  meds she is taking but will rx Metoprolol 50 mg po bid.       Relevant Medications   metoprolol (LOPRESSOR) 50 MG tablet   Seizure disorder ([place])    Unclear etiology of seizure activity but also complaining of worsening headaches so will refer to neurology for further work up.      Neck pain - Primary   Relevant Medications   ketorolac (TORADOL) 30 MG/ML injection 30  mg (Completed)   Other Relevant Orders   Ambulatory referral to Neurology   Acute bronchitis    Started on Omnicef and Mucinex. Encouraged increased rest and hydration, add probiotics, zinc such as Coldeze or Xicam. Treat fevers as needed       Other Visit Diagnoses    Intractable headache, unspecified chronicity pattern, unspecified headache type       Relevant Medications   ketorolac (TORADOL) 30 MG/ML injection 30 mg (Completed)   metoprolol (LOPRESSOR) 50 MG tablet   Other Relevant Orders   Ambulatory referral to Neurology   Seizures [facility])       Relevant Orders   Ambulatory referral to Neurology   Syncope, unspecified syncope type       Relevant Medications   metoprolol (LOPRESSOR) 50 MG tablet   Other Relevant Orders   Ambulatory referral to Neurology   Ambulatory referral to Cardiology   Severe episode of recurrent major depressive disorder, with psychotic features [facility])       Relevant Orders   Ambulatory referral to Psychiatry      I have discontinued Ms. Vancott's ibuprofen, metoprolol tartrate, and metoCLOPramide. I am also having her start on cefdinir, metoprolol, and QUEtiapine. Additionally, I am having her maintain her cyclobenzaprine, levETIRAcetam, oxyCODONE-acetaminophen, hydrOXYzine, ranitidine, Azelastine HCl, Guaifenesin, triamcinolone cream, citalopram, famciclovir, gabapentin, and pantoprazole. We administered ketorolac.  Meds ordered this encounter  Medications  . cefdinir (OMNICEF) 300 MG capsule    Sig: Take 1 capsule (300 mg total) by mouth 2 (two) times daily.    Dispense:  20 capsule    Refill:  0  . ketorolac (TORADOL) 30 MG/ML injection 30 mg  . metoprolol (LOPRESSOR) 50 MG tablet    Sig: Take 1 tablet (50 mg total) by mouth 2 (two) times daily.    Dispense:  60 tablet    Refill:  3  . QUEtiapine (SEROQUEL) 100 MG tablet    Sig: 1/2 tab po qhs x 1 then 1 tab po qhs x 1 then 2 tabs po qhs x 1 then 3 tabs po qhs    Dispense:  90 tablet    Refill:   1     Courteny Egler, MD

## 2016-01-22 NOTE — Assessment & Plan Note (Signed)
Started on [place] and Mucinex. Encouraged increased rest and hydration, add probiotics, zinc such as Coldeze or Xicam. Treat fevers as needed

## 2016-01-22 NOTE — Assessment & Plan Note (Signed)
Reports several episodes of sysncope and other episodes of atypical chest discomfort. Due to the difficulty assessing this patients symptoms she is in need of a cardiology consult and testing. Will refer

## 2016-01-23 NOTE — Telephone Encounter (Signed)
Patients husband informed of PCP instructions

## 2016-01-24 ENCOUNTER — Encounter: Payer: Self-pay | Admitting: Cardiology

## 2016-01-30 ENCOUNTER — Ambulatory Visit (INDEPENDENT_AMBULATORY_CARE_PROVIDER_SITE_OTHER): Payer: 59 | Admitting: Cardiology

## 2016-01-30 ENCOUNTER — Encounter: Payer: Self-pay | Admitting: Cardiology

## 2016-01-30 VITALS — BP 114/62 | HR 76 | Ht 62.0 in | Wt 105.4 lb

## 2016-01-30 DIAGNOSIS — R55 Syncope and collapse: Secondary | ICD-10-CM

## 2016-01-30 DIAGNOSIS — G40909 Epilepsy, unspecified, not intractable, without status epilepticus: Secondary | ICD-10-CM

## 2016-01-30 DIAGNOSIS — F418 Other specified anxiety disorders: Secondary | ICD-10-CM

## 2016-01-30 DIAGNOSIS — F32A Depression, unspecified: Secondary | ICD-10-CM

## 2016-01-30 DIAGNOSIS — I10 Essential (primary) hypertension: Secondary | ICD-10-CM | POA: Diagnosis not present

## 2016-01-30 DIAGNOSIS — R002 Palpitations: Secondary | ICD-10-CM | POA: Insufficient documentation

## 2016-01-30 DIAGNOSIS — F329 Major depressive disorder, single episode, unspecified: Secondary | ICD-10-CM

## 2016-01-30 DIAGNOSIS — F419 Anxiety disorder, unspecified: Secondary | ICD-10-CM

## 2016-01-30 NOTE — Progress Notes (Signed)
PCP: [name], MD  Clinic Note: Chief Complaint  Patient presents with  . New Patient (Initial Visit)  . Chest Pain    stabbing pain  . Shortness of Breath    always    HPI: Ruth Bell is a 55 y.o. female with a PMH below who presents today for Evaluation of a possible syncopal episode.  Raquel Sarna wasSeen by Dr. Randel Pigg on October 31 for neck pain  Recent Hospitalizations: ER visit on 01/10/2016 for dehydration.  Studies Reviewed: Carotid Dopplers January 2017 - Mild bilateral plaque without focal.  Interval History: Loey was not a very good historian today. She had a very hoarse voice and was very distressed after having done her orthostatic blood pressure check. She was trembling and barely responsive. She hardly answer any questions. Most of the history was provided by her husband. Fact while I was examining her and talking to her, she had an episode of what looked like a seizure with a prodrome of shaking/trembling but no real myoclonus and somewhat staring into space. She then had an episode where she became unresponsive and had drooling with more myoclonic type actions. It took quite a long time for her to recover and when she did she simply stated I want to go home.  I really did not get a good sense as to what her symptoms are basically it appears that she has syncopal episodes following sneezing. He has passed out several times after a sneeze. Prior to this it used to be that she would pass out on the Benicar after going over a bump. Now the passing out spells happen after sneezing but also following a single seizure. She does have postictal stages of confusion as was with this today. For light the last month she has been pretty much homebound barely getting out of the bed. She walks to the bathroom and maybe the kitchen. She has extremely poor balance and is profoundly weak. From a cardiac standpoint other than these pass out spells, she has had intermittent  episodes of left-sided aching and is in her chest. Also some possible flip-flopping symptoms. Clearly the aching in her chest has nothing to do with any exertion as she has not been exerting herself. The episodes happen regardless of what she is doing.  No sensation of rapid irregular heartbeats prior to her pass out spells. They're not associated with change in position. She denies any PND or orthopnea or edema. He does not appear to she has any TIA or amaurosis fugax symptoms.  No claudication.  ROS: A comprehensive was performed - with her husband answering questions Review of Systems  Unable to perform ROS: Mental acuity  Constitutional: Negative for chills and fever.  HENT: Negative for congestion, hearing loss, sinus pain and sore throat.   Eyes: Positive for blurred vision (She notes it when she has her pass out spells).  Respiratory: Positive for cough (More sneezes and wheezes. Maybe once every couple weeks.).   Cardiovascular: Positive for chest pain.  Gastrointestinal: Negative for blood in stool, heartburn, melena, nausea and vomiting.  Genitourinary: Negative for dysuria, frequency and hematuria.  Musculoskeletal: Positive for falls (And she passes out standing up). Negative for joint pain and myalgias.  Neurological: Positive for dizziness, speech change (She is quite hoarse now, and is very slow to respond), seizures, loss of consciousness and weakness (Generalized). Negative for focal weakness.  Psychiatric/Behavioral: The patient is nervous/anxious.        Unable to assess  All other systems reviewed and are negative.   Past Medical History:  Diagnosis Date  . Anemia   . Anxiety   . Arm skin lesion, left 09/05/2014  . Constipation 12/27/2013  . Depression   . Eczema 08/22/2015  . GERD (gastroesophageal reflux disease)   . History of shingles 01/22/2016  . Hyperglycemia 01/15/2015  . Hyperlipidemia, mixed 08/22/2015  . Hypertension   . Menometrorrhagia 2006  . Migraine,  unspecified, without mention of intractable migraine without mention of status migrainosus   . Pseudoseizures   . Seizures ([place])    history of pseudoseizure disorder, last last seizure 3 wks, keppra inc in dosage  . Tubular adenoma of colon 05/2011  . Uterine leiomyoma 2006    Past Surgical History:  Procedure Laterality Date  . ABDOMINAL HYSTERECTOMY  04/06/04  . APPENDECTOMY    . BILATERAL SALPINGOOPHORECTOMY  04/06/04  . MASS EXCISION Left 06/02/2015   Procedure: EXCISION LEFT ARM MASS;  Surgeon: Autumn Messing III, MD;  Location: [facility];  Service: General;  Laterality: Left;  . ROTATOR CUFF REPAIR      Current Meds  Medication Sig  . ALPRAZolam (XANAX) 0.5 MG tablet Take 1 tablet (0.5 mg total) by mouth 2 (two) times daily as needed for anxiety.  . Azelastine HCl 0.15 % SOLN Place 2 sprays into both nostrils 2 (two) times daily.  . benzonatate (TESSALON) 100 MG capsule Take 1-2 capsules (100-200 mg total) by mouth 3 (three) times daily as needed for cough.  . citalopram (CELEXA) 20 MG tablet TAKE 1 TABLET (20 MG TOTAL) BY MOUTH DAILY.  . cyclobenzaprine (FLEXERIL) 10 MG tablet Take 1 tablet (10 mg total) by mouth 3 (three) times daily as needed for muscle spasms.  . famciclovir (FAMVIR) 500 MG tablet Take 1 tablet (500 mg total) by mouth 3 (three) times daily.  Marland Kitchen gabapentin (NEURONTIN) 600 MG tablet Take 1 tablet (600 mg total) by mouth 3 (three) times daily. Reported on 05/26/2015  . Guaifenesin (MUCINEX MAXIMUM STRENGTH) 1200 MG TB12 Take 1 tablet (1,200 mg total) by mouth every 12 (twelve) hours as needed.  . hydrOXYzine (ATARAX/VISTARIL) 10 MG tablet Take 1 tablet (10 mg total) by mouth every 8 (eight) hours as needed for itching.  . levETIRAcetam (KEPPRA) 500 MG tablet TAKE 1 TABLET (500 MG TOTAL) BY MOUTH TWO (2) TIMES A DAY.  . metoprolol (LOPRESSOR) 50 MG tablet Take 1 tablet (50 mg total) by mouth 2 (two) times daily.  . ondansetron (ZOFRAN ODT) 4 MG disintegrating  tablet Take 1 tablet (4 mg total) by mouth every 8 (eight) hours as needed for nausea or vomiting.  Marland Kitchen oxyCODONE-acetaminophen (ROXICET) 5-325 MG tablet Take 1-2 tablets by mouth every 4 (four) hours as needed.  . pantoprazole (PROTONIX) 40 MG tablet TAKE 1 TABLET (40 MG TOTAL) BY MOUTH DAILY.  Marland Kitchen QUEtiapine (SEROQUEL) 100 MG tablet 1/2 tab po qhs x 1 then 1 tab po qhs x 1 then 2 tabs po qhs x 1 then 3 tabs po qhs  . ranitidine (ZANTAC) 150 MG tablet Take 1 tablet (150 mg total) by mouth 2 (two) times daily.  Marland Kitchen triamcinolone cream (KENALOG) 0.1 % Apply 1 application topically 2 (two) times daily.   No Known Allergies   Social History   Social History  . Marital status: Married    Spouse name: Gwyndolyn Saxon  . Number of children: N/A  . Years of education: N/A   Occupational History  . [employer]   Social History Main Topics  . Smoking status: Former Smoker    Packs/day: 0.50    Years: 40.00    Types: Cigarettes    Start date: 12/18/2015  . Smokeless tobacco: Never Used     Comment: 3-4 cigarettes daily  . Alcohol use No  . Drug use: No  . Sexual activity: Not Asked     Comment: lives with husband, no dietary restrictions.    Other Topics Concern  . None   Social History Narrative   Lives with her husband and their son.   Daughter lives in [place], [place].    @famh @  Wt Readings from Last 3 Encounters:  01/30/16 47.8 kg (105 lb 6.4 oz)  01/17/16 45.9 kg (101 lb 2 oz)  01/10/16 45.8 kg (101 lb)    PHYSICAL EXAM BP 114/62 (BP Location: Left Arm, Cuff Size: Normal)   Pulse 76   Ht 5\' 2"  (1.575 m)   Wt 47.8 kg (105 lb 6.4 oz)   LMP  (LMP Unknown)   BMI 19.28 kg/m  General appearance: alert, cooperative, appears stated age, no distress; thin & frail.  Very anxious. Minimal verbal responses to ?s (needed to be led to answer).   HEENT: [place]/AT, MMM, anicteric sclera - would not follow commands to determine EOM & Pupil reactivity. Neck: no adenopathy, no carotid  bruit and no JVD Lungs: clear to auscultation bilaterally, normal percussion bilaterally and non-labored Heart: regular rate and rhythm, S1& S2 normal, no murmur, click, rub or gallop Abdomen: soft, non-tender; bowel sounds normal; no masses,  no organomegaly;  Extremities: extremities normal, atraumatic, no cyanosis, oredema Pulses: 2+ and symmetric;  Skin: mobility and turgor normal  Neurologic: Mental status: Alert, oriented, thought content appropriate, alertness: minimally responsive - barely spoke with a hoarse voice.  would not follow commands. --- very anxious & shaky.  During the exam, she had trmbling & shaking followed by more myocloinic jerking & frothing at the mouth (? c/w a Seizure), affect: constricted and flat Gait: unsteady - wobbly Unable to assess Neuro Exam, Cranial Nerves etc.  Slow, labored gait.  Clearly unsteady.   Adult ECG Report - would not let the CMA check EKG  Other studies Reviewed: Additional studies/ records that were reviewed today include:  Recent Labs:   Lab Results  Component Value Date   CREATININE 0.63 01/10/2016   Lab Results  Component Value Date   K 4.2 01/10/2016   Lab Results  Component Value Date   CHOL 214 (H) 05/26/2015   HDL 59.50 05/26/2015   LDLCALC 134 (H) 05/26/2015   TRIG 104.0 05/26/2015   CHOLHDL 4 05/26/2015     ASSESSMENT / PLAN: Problem List Items Addressed This Visit    SYNCOPE - Primary (Chronic)    INR, understanding what exactly is happening. This is a very unusual etiology for passing out after sneezing. She is artery had a carotid Doppler to assess for carotid stenosis which is normal. As part of the workup for syncope, we will order an echocardiogram to exclude structural abnormality (very difficult to examine her because of her having a seizure event in the office). We will also see if we can potentially have her wear a monitor for 30 days to determine if there is any redness associated with these episodes. She  did not describe a prodrome, however she is not really describing any symptoms - all history is from her husband      Relevant Orders   ECHOCARDIOGRAM  COMPLETE   Cardiac event monitor   Essential hypertension (Chronic)    Blood pressure is actually well controlled today on twice a day metoprolol.      Seizure disorder ([place]) (Chronic)    Again unclear what's causing the seizures. She had a clear episode in my office today. Full medications, this seemed to be driven by a sheer anxiety which questions the potential of being pseudoseizure.      Palpitations (Chronic)    There was some suggestion of palpitations in the story that I heard. With her having syncope episodes, we need to exclude arrhythmia.  30 day event monitor      Relevant Orders   ECHOCARDIOGRAM COMPLETE   Cardiac event monitor   Anxiety and depression     Partially because of her having the seizure type activity during the clinic evaluation, I spent over an hour with the patient and her husband. At least, if not >50% of the visit was in direct patient contact & counseling as well as trying to stabilize during her "event".  Current medicines are reviewed at length with the patient today. (+/- concerns) none The following changes have been made:   Patient Instructions  SCHEDULE AT [facility] has requested that you have an echocardiogram. Echocardiography is a painless test that uses sound waves to create images of your heart. It provides your doctor with information about the size and shape of your heart and how well your heart's chambers and valves are working. This procedure takes approximately one hour. There are no restrictions for this procedure.  Your physician has recommended that you wear an event monitor WEAR FOR 14 DAYS. Event monitors are medical devices that record the heart's electrical activity. Doctors most often [name] these monitors to diagnose arrhythmias. Arrhythmias  are problems with the speed or rhythm of the heartbeat. The monitor is a small, portable device. You can wear one while you do your normal daily activities. This is usually used to diagnose what is causing palpitations/syncope (passing out).    NO OTHER CHANGES WITH MEDICATIONS  Your physician recommends that you schedule a follow-up appointment in: 2 MONTHS WITH DR Kathlene Yano--AFTER TEST - F/U TEST RESULTS ( ALL TEST NEED TO BE DONE PRIOR TO APPOINTMENT)    Studies Ordered:   Orders Placed This Encounter  Procedures  . Cardiac event monitor  . ECHOCARDIOGRAM COMPLETE      Glenetta Hew, M.D., M.S. Interventional Cardiologist   Pager # [phone] Phone # [phone] [address] [place], [place] [address]

## 2016-01-30 NOTE — Patient Instructions (Addendum)
SCHEDULE AT [facility] has requested that you have an echocardiogram. Echocardiography is a painless test that uses sound waves to create images of your heart. It provides your doctor with information about the size and shape of your heart and how well your heart's chambers and valves are working. This procedure takes approximately one hour. There are no restrictions for this procedure.  Your physician has recommended that you wear an event monitor WEAR FOR 14 DAYS. Event monitors are medical devices that record the heart's electrical activity. Doctors most often [name] these monitors to diagnose arrhythmias. Arrhythmias are problems with the speed or rhythm of the heartbeat. The monitor is a small, portable device. You can wear one while you do your normal daily activities. This is usually used to diagnose what is causing palpitations/syncope (passing out).    NO OTHER CHANGES WITH MEDICATIONS  Your physician recommends that you schedule a follow-up appointment in: 2 MONTHS WITH DR HARDING--AFTER TEST - F/U TEST RESULTS ( ALL TEST NEED TO BE DONE PRIOR TO APPOINTMENT)

## 2016-01-31 NOTE — Assessment & Plan Note (Signed)
Again unclear what's causing the seizures. She had a clear episode in my office today. Full medications, this seemed to be driven by a sheer anxiety which questions the potential of being pseudoseizure.

## 2016-01-31 NOTE — Assessment & Plan Note (Signed)
INR, understanding what exactly is happening. This is a very unusual etiology for passing out after sneezing. She is artery had a carotid Doppler to assess for carotid stenosis which is normal. As part of the workup for syncope, we will order an echocardiogram to exclude structural abnormality (very difficult to examine her because of her having a seizure event in the office). We will also see if we can potentially have her wear a monitor for 30 days to determine if there is any redness associated with these episodes. She did not describe a prodrome, however she is not really describing any symptoms - all history is from her husband

## 2016-01-31 NOTE — Assessment & Plan Note (Signed)
Blood pressure is actually well controlled today on twice a day metoprolol.

## 2016-01-31 NOTE — Assessment & Plan Note (Signed)
There was some suggestion of palpitations in the story that I heard. With her having syncope episodes, we need to exclude arrhythmia.  30 day event monitor

## 2016-02-02 ENCOUNTER — Ambulatory Visit (INDEPENDENT_AMBULATORY_CARE_PROVIDER_SITE_OTHER): Payer: Self-pay | Admitting: Neurology

## 2016-02-02 DIAGNOSIS — G40909 Epilepsy, unspecified, not intractable, without status epilepticus: Secondary | ICD-10-CM

## 2016-02-14 ENCOUNTER — Ambulatory Visit (INDEPENDENT_AMBULATORY_CARE_PROVIDER_SITE_OTHER): Payer: 59

## 2016-02-14 DIAGNOSIS — R55 Syncope and collapse: Secondary | ICD-10-CM

## 2016-02-14 DIAGNOSIS — R002 Palpitations: Secondary | ICD-10-CM | POA: Diagnosis not present

## 2016-02-17 HISTORY — PX: OTHER SURGICAL HISTORY: SHX169

## 2016-02-21 ENCOUNTER — Encounter: Payer: Self-pay | Admitting: Family Medicine

## 2016-02-21 ENCOUNTER — Ambulatory Visit (INDEPENDENT_AMBULATORY_CARE_PROVIDER_SITE_OTHER): Payer: 59 | Admitting: Family Medicine

## 2016-02-21 VITALS — BP 122/72 | HR 60 | Temp 98.4°F | Ht 62.0 in | Wt 106.1 lb

## 2016-02-21 DIAGNOSIS — R739 Hyperglycemia, unspecified: Secondary | ICD-10-CM | POA: Diagnosis not present

## 2016-02-21 DIAGNOSIS — G43909 Migraine, unspecified, not intractable, without status migrainosus: Secondary | ICD-10-CM

## 2016-02-21 DIAGNOSIS — R319 Hematuria, unspecified: Secondary | ICD-10-CM

## 2016-02-21 DIAGNOSIS — I10 Essential (primary) hypertension: Secondary | ICD-10-CM

## 2016-02-21 DIAGNOSIS — R300 Dysuria: Secondary | ICD-10-CM | POA: Diagnosis not present

## 2016-02-21 DIAGNOSIS — G40909 Epilepsy, unspecified, not intractable, without status epilepticus: Secondary | ICD-10-CM

## 2016-02-21 MED ORDER — ONDANSETRON 4 MG PO TBDP: 4.0000 mg | ORAL_TABLET | Freq: Three times a day (TID) | ORAL | 0 refills | Status: DC | PRN

## 2016-02-21 MED ORDER — PANTOPRAZOLE SODIUM 40 MG PO TBEC: DELAYED_RELEASE_TABLET | ORAL | 2 refills | Status: DC

## 2016-02-21 MED ORDER — FAMCICLOVIR 500 MG PO TABS: 500.0000 mg | ORAL_TABLET | Freq: Three times a day (TID) | ORAL | 0 refills | Status: DC

## 2016-02-21 MED ORDER — ALPRAZOLAM 0.5 MG PO TABS: 0.5000 mg | ORAL_TABLET | Freq: Two times a day (BID) | ORAL | 3 refills | Status: DC | PRN

## 2016-02-21 NOTE — Patient Instructions (Signed)

## 2016-02-21 NOTE — Progress Notes (Signed)
Pre visit review using our clinic review tool, if applicable. No additional management support is needed unless otherwise documented below in the visit note. 

## 2016-02-22 LAB — URINALYSIS, ROUTINE W REFLEX MICROSCOPIC
Bilirubin Urine: NEGATIVE
Ketones, ur: NEGATIVE
Leukocytes, UA: NEGATIVE
Nitrite: NEGATIVE
Specific Gravity, Urine: 1.02 (ref 1.000–1.030)
Total Protein, Urine: NEGATIVE
Urine Glucose: NEGATIVE
Urobilinogen, UA: 0.2 (ref 0.0–1.0)
WBC, UA: NONE SEEN (ref 0–?)
pH: 6 (ref 5.0–8.0)

## 2016-02-23 ENCOUNTER — Other Ambulatory Visit: Payer: Self-pay

## 2016-02-23 ENCOUNTER — Ambulatory Visit (HOSPITAL_COMMUNITY): Payer: 59 | Attending: Cardiology

## 2016-02-23 DIAGNOSIS — I10 Essential (primary) hypertension: Secondary | ICD-10-CM | POA: Insufficient documentation

## 2016-02-23 DIAGNOSIS — R569 Unspecified convulsions: Secondary | ICD-10-CM | POA: Insufficient documentation

## 2016-02-23 DIAGNOSIS — E785 Hyperlipidemia, unspecified: Secondary | ICD-10-CM | POA: Diagnosis not present

## 2016-02-23 DIAGNOSIS — D649 Anemia, unspecified: Secondary | ICD-10-CM | POA: Insufficient documentation

## 2016-02-23 DIAGNOSIS — G43909 Migraine, unspecified, not intractable, without status migrainosus: Secondary | ICD-10-CM | POA: Insufficient documentation

## 2016-02-23 DIAGNOSIS — R55 Syncope and collapse: Secondary | ICD-10-CM | POA: Diagnosis not present

## 2016-02-23 DIAGNOSIS — F419 Anxiety disorder, unspecified: Secondary | ICD-10-CM | POA: Insufficient documentation

## 2016-02-23 DIAGNOSIS — R002 Palpitations: Secondary | ICD-10-CM | POA: Diagnosis not present

## 2016-02-23 LAB — URINE CULTURE: Organism ID, Bacteria: NO GROWTH

## 2016-02-27 NOTE — Progress Notes (Signed)
Echo results: Good news: Essentially normal echocardiogram and normal pump function and normal valve function.   EF: 55-60%. No regional wall motion abnormalities.  No signs to suggest heart attack.Marland Kitchen

## 2016-03-04 ENCOUNTER — Encounter: Payer: Self-pay | Admitting: Family Medicine

## 2016-03-04 DIAGNOSIS — R300 Dysuria: Secondary | ICD-10-CM | POA: Insufficient documentation

## 2016-03-04 DIAGNOSIS — R319 Hematuria, unspecified: Secondary | ICD-10-CM | POA: Insufficient documentation

## 2016-03-04 HISTORY — DX: Hematuria, unspecified: R31.9

## 2016-03-04 NOTE — Assessment & Plan Note (Signed)
minimize simple carbs. Increase exercise as tolerated.  

## 2016-03-04 NOTE — Progress Notes (Signed)
Patient ID: Ruth Bell, female   DOB: 1960-06-20, 55 y.o.   MRN: [id number]   Subjective:    Patient ID: Ruth Bell, female    DOB: February 26, 1961, 55 y.o.   MRN: [id number]  Chief Complaint  Patient presents with  . Follow-up    HPI Patient is in today for follow up. She is accompanied by her husband and is complaining of headache and hoarseness as well. No hospitalization since last visit but she continues to endorse weakness, syncope and presyncope, falls and malaise. Denies CP/palp/SOB/HA/congestion/fevers/GI or GU c/o. Taking meds as prescribed Past Medical History:  Diagnosis Date  . Anemia   . Anxiety   . Arm skin lesion, left 09/05/2014  . Constipation 12/27/2013  . Depression   . Eczema 08/22/2015  . GERD (gastroesophageal reflux disease)   . Hematuria 03/04/2016  . History of shingles 01/22/2016  . Hyperglycemia 01/15/2015  . Hyperlipidemia, mixed 08/22/2015  . Hypertension   . Menometrorrhagia 2006  . Migraine, unspecified, without mention of intractable migraine without mention of status migrainosus   . Pseudoseizures   . Seizures ([place])    history of pseudoseizure disorder, last last seizure 3 wks, keppra inc in dosage  . Tubular adenoma of colon 05/2011  . Uterine leiomyoma 2006    Past Surgical History:  Procedure Laterality Date  . ABDOMINAL HYSTERECTOMY  04/06/04  . APPENDECTOMY    . BILATERAL SALPINGOOPHORECTOMY  04/06/04  . MASS EXCISION Left 06/02/2015   Procedure: EXCISION LEFT ARM MASS;  Surgeon: Autumn Messing III, MD;  Location: [facility];  Service: General;  Laterality: Left;  . ROTATOR CUFF REPAIR      Family History  Problem Relation Age of Onset  . Heart disease Mother   . Heart attack Mother   . Cancer Father     colon  . Hypertension Father   . Cancer Sister     brain  . Cancer Brother   . Hypertension Sister   . Cancer Sister     unsure of type  . Diabetes Sister     type 2  . Heart attack Sister   . Hypertension Brother   .  Colon cancer Neg Hx     Social History   Social History  . Marital status: Married    Spouse name: Gwyndolyn Saxon  . Number of children: N/A  . Years of education: N/A   Occupational History  . CMA [employer]   Social History Main Topics  . Smoking status: Former Smoker    Packs/day: 0.50    Years: 40.00    Types: Cigarettes    Start date: 12/18/2015  . Smokeless tobacco: Never Used     Comment: 3-4 cigarettes daily  . Alcohol use No  . Drug use: No  . Sexual activity: Not on file     Comment: lives with husband, no dietary restrictions.    Other Topics Concern  . Not on file   Social History Narrative   Lives with her husband and their son.   Daughter lives in [place], [place].    Outpatient Medications Prior to Visit  Medication Sig Dispense Refill  . Azelastine HCl 0.15 % SOLN Place 2 sprays into both nostrils 2 (two) times daily. 30 mL 0  . benzonatate (TESSALON) 100 MG capsule Take 1-2 capsules (100-200 mg total) by mouth 3 (three) times daily as needed for cough. 40 capsule 0  . citalopram (CELEXA) 20 MG tablet TAKE 1 TABLET (20  MG TOTAL) BY MOUTH DAILY. 90 tablet 2  . cyclobenzaprine (FLEXERIL) 10 MG tablet Take 1 tablet (10 mg total) by mouth 3 (three) times daily as needed for muscle spasms. 90 tablet 1  . gabapentin (NEURONTIN) 600 MG tablet Take 1 tablet (600 mg total) by mouth 3 (three) times daily. Reported on 05/26/2015 90 tablet 1  . Guaifenesin (MUCINEX MAXIMUM STRENGTH) 1200 MG TB12 Take 1 tablet (1,200 mg total) by mouth every 12 (twelve) hours as needed. 14 tablet 1  . hydrOXYzine (ATARAX/VISTARIL) 10 MG tablet Take 1 tablet (10 mg total) by mouth every 8 (eight) hours as needed for itching. 21 tablet 0  . levETIRAcetam (KEPPRA) 500 MG tablet TAKE 1 TABLET (500 MG TOTAL) BY MOUTH TWO (2) TIMES A DAY.  3  . metoprolol (LOPRESSOR) 50 MG tablet Take 1 tablet (50 mg total) by mouth 2 (two) times daily. 60 tablet 3  . oxyCODONE-acetaminophen (ROXICET) 5-325 MG  tablet Take 1-2 tablets by mouth every 4 (four) hours as needed. 30 tablet 0  . QUEtiapine (SEROQUEL) 100 MG tablet 1/2 tab po qhs x 1 then 1 tab po qhs x 1 then 2 tabs po qhs x 1 then 3 tabs po qhs 90 tablet 1  . ranitidine (ZANTAC) 150 MG tablet Take 1 tablet (150 mg total) by mouth 2 (two) times daily. 60 tablet 2  . triamcinolone cream (KENALOG) 0.1 % Apply 1 application topically 2 (two) times daily. 45 g 0  . ALPRAZolam (XANAX) 0.5 MG tablet Take 1 tablet (0.5 mg total) by mouth 2 (two) times daily as needed for anxiety. 60 tablet 0  . famciclovir (FAMVIR) 500 MG tablet Take 1 tablet (500 mg total) by mouth 3 (three) times daily. 21 tablet 0  . ondansetron (ZOFRAN ODT) 4 MG disintegrating tablet Take 1 tablet (4 mg total) by mouth every 8 (eight) hours as needed for nausea or vomiting. 20 tablet 0  . pantoprazole (PROTONIX) 40 MG tablet TAKE 1 TABLET (40 MG TOTAL) BY MOUTH DAILY. 90 tablet 2   No facility-administered medications prior to visit.     No Known Allergies  Review of Systems  Constitutional: Negative for fever and malaise/fatigue.  HENT: Positive for congestion.   Eyes: Positive for pain. Negative for blurred vision, double vision, photophobia, discharge and redness.  Respiratory: Negative for shortness of breath.   Cardiovascular: Negative for chest pain, palpitations and leg swelling.  Gastrointestinal: Negative for abdominal pain, blood in stool and nausea.  Genitourinary: Negative for dysuria and frequency.  Musculoskeletal: Negative for falls.  Skin: Negative for rash.  Neurological: Positive for sensory change, focal weakness and headaches. Negative for dizziness and loss of consciousness.  Endo/Heme/Allergies: Negative for environmental allergies.  Psychiatric/Behavioral: Negative for depression. The patient is nervous/anxious.        Objective:    Physical Exam  Constitutional: She is oriented to person, place, and time. She appears well-developed and  well-nourished. No distress.  HENT:  Head: Normocephalic and atraumatic.  Right Ear: External ear normal.  Left Ear: External ear normal.  Nose: Nose normal.  Mouth/Throat: Oropharynx is clear and moist.  Eyes: Conjunctivae and EOM are normal. Pupils are equal, round, and reactive to light. Right eye exhibits no discharge. Left eye exhibits no discharge.  Neck: Normal range of motion. Neck supple. No JVD present. No thyromegaly present.  Cardiovascular: Normal rate, regular rhythm, normal heart sounds and intact distal pulses.   No murmur heard. Pulmonary/Chest: Effort normal and breath sounds normal.  No respiratory distress. She has no wheezes. She has no rales. She exhibits no tenderness.  Abdominal: Soft. Bowel sounds are normal. She exhibits no distension and no mass. There is no tenderness. There is no rebound and no guarding.  Genitourinary: Vagina normal and uterus normal. Rectal exam shows guaiac negative stool. No vaginal discharge found.  Musculoskeletal: Normal range of motion. She exhibits no edema or tenderness.  Lymphadenopathy:    She has no cervical adenopathy.  Neurological: She is alert and oriented to person, place, and time. She has normal reflexes. No cranial nerve deficit.  Skin: Skin is warm and dry. No rash noted. She is not diaphoretic. No erythema.  Psychiatric: She has a normal mood and affect. Her behavior is normal. Judgment and thought content normal.  Nursing note and vitals reviewed.   BP 122/72 (BP Location: Left Arm, Patient Position: Sitting, Cuff Size: Normal)   Pulse 60   Temp 98.4 F (36.9 C) (Oral)   Ht 5\' 2"  (1.575 m)   Wt 106 lb 2 oz (48.1 kg)   LMP  (LMP Unknown)   SpO2 97%   BMI 19.41 kg/m  Wt Readings from Last 3 Encounters:  02/21/16 106 lb 2 oz (48.1 kg)  01/30/16 105 lb 6.4 oz (47.8 kg)  01/17/16 101 lb 2 oz (45.9 kg)     Lab Results  Component Value Date   WBC 8.2 01/10/2016   HGB 13.6 01/10/2016   HCT 39.2 01/10/2016   PLT  274 01/10/2016   GLUCOSE 88 01/10/2016   CHOL 214 (H) 05/26/2015   TRIG 104.0 05/26/2015   HDL 59.50 05/26/2015   LDLCALC 134 (H) 05/26/2015   ALT 11 (L) 01/10/2016   AST 20 01/10/2016   NA 138 01/10/2016   K 4.2 01/10/2016   CL 107 01/10/2016   CREATININE 0.63 01/10/2016   BUN 12 01/10/2016   CO2 24 01/10/2016   TSH 1.02 05/26/2015   INR 1.0 11/19/2013   HGBA1C 6.6 (H) 05/26/2015    Lab Results  Component Value Date   TSH 1.02 05/26/2015   Lab Results  Component Value Date   WBC 8.2 01/10/2016   HGB 13.6 01/10/2016   HCT 39.2 01/10/2016   MCV 80.3 01/10/2016   PLT 274 01/10/2016   Lab Results  Component Value Date   NA 138 01/10/2016   K 4.2 01/10/2016   CO2 24 01/10/2016   GLUCOSE 88 01/10/2016   BUN 12 01/10/2016   CREATININE 0.63 01/10/2016   BILITOT 0.4 01/10/2016   ALKPHOS 46 01/10/2016   AST 20 01/10/2016   ALT 11 (L) 01/10/2016   PROT 7.9 01/10/2016   ALBUMIN 4.4 01/10/2016   CALCIUM 9.4 01/10/2016   ANIONGAP 7 01/10/2016   GFR 127.67 07/28/2015   Lab Results  Component Value Date   CHOL 214 (H) 05/26/2015   Lab Results  Component Value Date   HDL 59.50 05/26/2015   Lab Results  Component Value Date   LDLCALC 134 (H) 05/26/2015   Lab Results  Component Value Date   TRIG 104.0 05/26/2015   Lab Results  Component Value Date   CHOLHDL 4 05/26/2015   Lab Results  Component Value Date   HGBA1C 6.6 (H) 05/26/2015       Assessment & Plan:   Problem List Items Addressed This Visit    Essential hypertension (Chronic)    Well controlled, no changes to meds. Encouraged heart healthy diet such as the DASH diet and exercise as tolerated.  Seizure disorder (HCC) (Chronic)    Vs pseudoseizures. Has been set up with neurology      Migraine    Encouraged increased hydration, 64 ounces of clear fluids daily. Minimize alcohol and caffeine. Eat small frequent meals with lean proteins and complex carbs. Avoid high and low blood sugars.  Get adequate sleep, 7-8 hours a night. Needs exercise daily preferably in the morning. Has been referred to neurology for further consideration      Hyperglycemia    minimize simple carbs. Increase exercise as tolerated      Dysuria - Primary    Urine culture negative. Encouraged increased hydration      Relevant Orders   Urinalysis   Urine culture (Completed)   Urinalysis, Routine w reflex microscopic (Completed)   Hematuria    No infection and persistent x 3 samples. Referred back to urology if patient agrees         I am having Ms. West maintain her cyclobenzaprine, levETIRAcetam, oxyCODONE-acetaminophen, hydrOXYzine, ranitidine, Azelastine HCl, Guaifenesin, triamcinolone cream, citalopram, gabapentin, metoprolol, QUEtiapine, benzonatate, ondansetron, ALPRAZolam, famciclovir, and pantoprazole.  Meds ordered this encounter  Medications  . ondansetron (ZOFRAN ODT) 4 MG disintegrating tablet    Sig: Take 1 tablet (4 mg total) by mouth every 8 (eight) hours as needed for nausea or vomiting.    Dispense:  20 tablet    Refill:  0  . ALPRAZolam (XANAX) 0.5 MG tablet    Sig: Take 1 tablet (0.5 mg total) by mouth 2 (two) times daily as needed for anxiety.    Dispense:  60 tablet    Refill:  3  . famciclovir (FAMVIR) 500 MG tablet    Sig: Take 1 tablet (500 mg total) by mouth 3 (three) times daily.    Dispense:  21 tablet    Refill:  0  . pantoprazole (PROTONIX) 40 MG tablet    Sig: TAKE 1 TABLET (40 MG TOTAL) BY MOUTH DAILY.    Dispense:  90 tablet    Refill:  2     [name], MD

## 2016-03-04 NOTE — Assessment & Plan Note (Signed)
Urine culture negative. Encouraged increased hydration

## 2016-03-04 NOTE — Assessment & Plan Note (Signed)
Well controlled, no changes to meds. Encouraged heart healthy diet such as the DASH diet and exercise as tolerated.  °

## 2016-03-04 NOTE — Progress Notes (Signed)
Patient ID: Ruth Bell, female   DOB: 08/31/1960, 55 y.o.   MRN: [id number]   Subjective:    Patient ID: Ruth Bell, female    DOB: Mar 23, 1960, 55 y.o.   MRN: [id number]  Chief Complaint  Patient presents with  . Follow-up    HPI Patient is in today for follow up accompanied by her husband. She continues to struggle with headache, anxiety, weakness, tingling, presyncope, poor balance and headache with nausea. She did fall and hit her head last week but is improving. They endorse headaches daily. She also notes dysuria. No abdominal or back pain. Denies CP/palp/SOB/HA/congestion/fevers/GI c/o. Taking meds as prescribed  Past Medical History:  Diagnosis Date  . Anemia   . Anxiety   . Arm skin lesion, left 09/05/2014  . Constipation 12/27/2013  . Depression   . Eczema 08/22/2015  . GERD (gastroesophageal reflux disease)   . Hematuria 03/04/2016  . History of shingles 01/22/2016  . Hyperglycemia 01/15/2015  . Hyperlipidemia, mixed 08/22/2015  . Hypertension   . Menometrorrhagia 2006  . Migraine, unspecified, without mention of intractable migraine without mention of status migrainosus   . Pseudoseizures   . Seizures ([place])    history of pseudoseizure disorder, last last seizure 3 wks, keppra inc in dosage  . Tubular adenoma of colon 05/2011  . Uterine leiomyoma 2006    Past Surgical History:  Procedure Laterality Date  . ABDOMINAL HYSTERECTOMY  04/06/04  . APPENDECTOMY    . BILATERAL SALPINGOOPHORECTOMY  04/06/04  . MASS EXCISION Left 06/02/2015   Procedure: EXCISION LEFT ARM MASS;  Surgeon: Autumn Messing III, MD;  Location: [facility];  Service: General;  Laterality: Left;  . ROTATOR CUFF REPAIR      Family History  Problem Relation Age of Onset  . Heart disease Mother   . Heart attack Mother   . Cancer Father     colon  . Hypertension Father   . Cancer Sister     brain  . Cancer Brother   . Hypertension Sister   . Cancer Sister     unsure of type  .  Diabetes Sister     type 2  . Heart attack Sister   . Hypertension Brother   . Colon cancer Neg Hx     Social History   Social History  . Marital status: Married    Spouse name: Gwyndolyn Saxon  . Number of children: N/A  . Years of education: N/A   Occupational History  . CMA [employer]   Social History Main Topics  . Smoking status: Former Smoker    Packs/day: 0.50    Years: 40.00    Types: Cigarettes    Start date: 12/18/2015  . Smokeless tobacco: Never Used     Comment: 3-4 cigarettes daily  . Alcohol use No  . Drug use: No  . Sexual activity: Not on file     Comment: lives with husband, no dietary restrictions.    Other Topics Concern  . Not on file   Social History Narrative   Lives with her husband and their son.   Daughter lives in [place], [place].    Outpatient Medications Prior to Visit  Medication Sig Dispense Refill  . Azelastine HCl 0.15 % SOLN Place 2 sprays into both nostrils 2 (two) times daily. 30 mL 0  . benzonatate (TESSALON) 100 MG capsule Take 1-2 capsules (100-200 mg total) by mouth 3 (three) times daily as needed for cough. 40 capsule  0  . citalopram (CELEXA) 20 MG tablet TAKE 1 TABLET (20 MG TOTAL) BY MOUTH DAILY. 90 tablet 2  . cyclobenzaprine (FLEXERIL) 10 MG tablet Take 1 tablet (10 mg total) by mouth 3 (three) times daily as needed for muscle spasms. 90 tablet 1  . gabapentin (NEURONTIN) 600 MG tablet Take 1 tablet (600 mg total) by mouth 3 (three) times daily. Reported on 05/26/2015 90 tablet 1  . Guaifenesin (MUCINEX MAXIMUM STRENGTH) 1200 MG TB12 Take 1 tablet (1,200 mg total) by mouth every 12 (twelve) hours as needed. 14 tablet 1  . hydrOXYzine (ATARAX/VISTARIL) 10 MG tablet Take 1 tablet (10 mg total) by mouth every 8 (eight) hours as needed for itching. 21 tablet 0  . levETIRAcetam (KEPPRA) 500 MG tablet TAKE 1 TABLET (500 MG TOTAL) BY MOUTH TWO (2) TIMES A DAY.  3  . metoprolol (LOPRESSOR) 50 MG tablet Take 1 tablet (50 mg total) by  mouth 2 (two) times daily. 60 tablet 3  . oxyCODONE-acetaminophen (ROXICET) 5-325 MG tablet Take 1-2 tablets by mouth every 4 (four) hours as needed. 30 tablet 0  . QUEtiapine (SEROQUEL) 100 MG tablet 1/2 tab po qhs x 1 then 1 tab po qhs x 1 then 2 tabs po qhs x 1 then 3 tabs po qhs 90 tablet 1  . ranitidine (ZANTAC) 150 MG tablet Take 1 tablet (150 mg total) by mouth 2 (two) times daily. 60 tablet 2  . triamcinolone cream (KENALOG) 0.1 % Apply 1 application topically 2 (two) times daily. 45 g 0  . ALPRAZolam (XANAX) 0.5 MG tablet Take 1 tablet (0.5 mg total) by mouth 2 (two) times daily as needed for anxiety. 60 tablet 0  . famciclovir (FAMVIR) 500 MG tablet Take 1 tablet (500 mg total) by mouth 3 (three) times daily. 21 tablet 0  . ondansetron (ZOFRAN ODT) 4 MG disintegrating tablet Take 1 tablet (4 mg total) by mouth every 8 (eight) hours as needed for nausea or vomiting. 20 tablet 0  . pantoprazole (PROTONIX) 40 MG tablet TAKE 1 TABLET (40 MG TOTAL) BY MOUTH DAILY. 90 tablet 2   No facility-administered medications prior to visit.     No Known Allergies  Review of Systems  Constitutional: Negative for fever and malaise/fatigue.  HENT: Positive for congestion.   Eyes: Negative for blurred vision.  Respiratory: Negative for shortness of breath.   Cardiovascular: Negative for chest pain, palpitations and leg swelling.  Gastrointestinal: Negative for abdominal pain, blood in stool and nausea.  Genitourinary: Negative for dysuria and frequency.  Musculoskeletal: Negative for falls.  Skin: Negative for rash.  Neurological: Positive for focal weakness, seizures and headaches. Negative for dizziness and loss of consciousness.  Endo/Heme/Allergies: Negative for environmental allergies.  Psychiatric/Behavioral: Negative for depression. The patient is nervous/anxious.        Objective:    Physical Exam  Constitutional: She is oriented to person, place, and time. She appears well-developed  and well-nourished. No distress.  HENT:  Head: Normocephalic and atraumatic.  Nose: Nose normal.  Eyes: Right eye exhibits no discharge. Left eye exhibits no discharge.  Neck: Normal range of motion. Neck supple.  Cardiovascular: Normal rate and regular rhythm.   No murmur heard. Pulmonary/Chest: Effort normal and breath sounds normal.  Abdominal: Soft. Bowel sounds are normal. There is no tenderness.  Musculoskeletal: She exhibits no edema.  Neurological: She is alert and oriented to person, place, and time.  Skin: Skin is warm and dry.  Psychiatric:  Patient anxious and  c/o headache  Nursing note and vitals reviewed.   BP 122/72 (BP Location: Left Arm, Patient Position: Sitting, Cuff Size: Normal)   Pulse 60   Temp 98.4 F (36.9 C) (Oral)   Ht 5\' 2"  (1.575 m)   Wt 106 lb 2 oz (48.1 kg)   LMP  (LMP Unknown)   SpO2 97%   BMI 19.41 kg/m  Wt Readings from Last 3 Encounters:  02/21/16 106 lb 2 oz (48.1 kg)  01/30/16 105 lb 6.4 oz (47.8 kg)  01/17/16 101 lb 2 oz (45.9 kg)     Lab Results  Component Value Date   WBC 8.2 01/10/2016   HGB 13.6 01/10/2016   HCT 39.2 01/10/2016   PLT 274 01/10/2016   GLUCOSE 88 01/10/2016   CHOL 214 (H) 05/26/2015   TRIG 104.0 05/26/2015   HDL 59.50 05/26/2015   LDLCALC 134 (H) 05/26/2015   ALT 11 (L) 01/10/2016   AST 20 01/10/2016   NA 138 01/10/2016   K 4.2 01/10/2016   CL 107 01/10/2016   CREATININE 0.63 01/10/2016   BUN 12 01/10/2016   CO2 24 01/10/2016   TSH 1.02 05/26/2015   INR 1.0 11/19/2013   HGBA1C 6.6 (H) 05/26/2015    Lab Results  Component Value Date   TSH 1.02 05/26/2015   Lab Results  Component Value Date   WBC 8.2 01/10/2016   HGB 13.6 01/10/2016   HCT 39.2 01/10/2016   MCV 80.3 01/10/2016   PLT 274 01/10/2016   Lab Results  Component Value Date   NA 138 01/10/2016   K 4.2 01/10/2016   CO2 24 01/10/2016   GLUCOSE 88 01/10/2016   BUN 12 01/10/2016   CREATININE 0.63 01/10/2016   BILITOT 0.4  01/10/2016   ALKPHOS 46 01/10/2016   AST 20 01/10/2016   ALT 11 (L) 01/10/2016   PROT 7.9 01/10/2016   ALBUMIN 4.4 01/10/2016   CALCIUM 9.4 01/10/2016   ANIONGAP 7 01/10/2016   GFR 127.67 07/28/2015   Lab Results  Component Value Date   CHOL 214 (H) 05/26/2015   Lab Results  Component Value Date   HDL 59.50 05/26/2015   Lab Results  Component Value Date   LDLCALC 134 (H) 05/26/2015   Lab Results  Component Value Date   TRIG 104.0 05/26/2015   Lab Results  Component Value Date   CHOLHDL 4 05/26/2015   Lab Results  Component Value Date   HGBA1C 6.6 (H) 05/26/2015       Assessment & Plan:   Problem List Items Addressed This Visit    Essential hypertension (Chronic)    Well controlled, no changes to meds. Encouraged heart healthy diet such as the DASH diet and exercise as tolerated.       Seizure disorder (HCC) (Chronic)    Vs pseudoseizures. Has been set up with neurology      Migraine    Encouraged increased hydration, 64 ounces of clear fluids daily. Minimize alcohol and caffeine. Eat small frequent meals with lean proteins and complex carbs. Avoid high and low blood sugars. Get adequate sleep, 7-8 hours a night. Needs exercise daily preferably in the morning. Has been referred to neurology for further consideration      Hyperglycemia    minimize simple carbs. Increase exercise as tolerated      Dysuria - Primary    Urine culture negative. Encouraged increased hydration      Relevant Orders   Urinalysis   Urine culture (Completed)   Hematuria  No infection and persistent x 3 samples. Referred back to urology if patient agrees         I am having Ms. Trentham maintain her cyclobenzaprine, levETIRAcetam, oxyCODONE-acetaminophen, hydrOXYzine, ranitidine, Azelastine HCl, Guaifenesin, triamcinolone cream, citalopram, gabapentin, metoprolol, QUEtiapine, benzonatate, ondansetron, ALPRAZolam, famciclovir, and pantoprazole.  Meds ordered this encounter    Medications  . ondansetron (ZOFRAN ODT) 4 MG disintegrating tablet    Sig: Take 1 tablet (4 mg total) by mouth every 8 (eight) hours as needed for nausea or vomiting.    Dispense:  20 tablet    Refill:  0  . ALPRAZolam (XANAX) 0.5 MG tablet    Sig: Take 1 tablet (0.5 mg total) by mouth 2 (two) times daily as needed for anxiety.    Dispense:  60 tablet    Refill:  3  . famciclovir (FAMVIR) 500 MG tablet    Sig: Take 1 tablet (500 mg total) by mouth 3 (three) times daily.    Dispense:  21 tablet    Refill:  0  . pantoprazole (PROTONIX) 40 MG tablet    Sig: TAKE 1 TABLET (40 MG TOTAL) BY MOUTH DAILY.    Dispense:  90 tablet    Refill:  2     [name], MD

## 2016-03-04 NOTE — Assessment & Plan Note (Signed)
No infection and persistent x 3 samples. Referred back to urology if patient agrees

## 2016-03-04 NOTE — Assessment & Plan Note (Signed)
Vs pseudoseizures. Has been set up with neurology

## 2016-03-04 NOTE — Assessment & Plan Note (Addendum)
Encouraged increased hydration, 64 ounces of clear fluids daily. Minimize alcohol and caffeine. Eat small frequent meals with lean proteins and complex carbs. Avoid high and low blood sugars. Get adequate sleep, 7-8 hours a night. Needs exercise daily preferably in the morning. Has been referred to neurology for further consideration

## 2016-03-05 ENCOUNTER — Telehealth: Payer: Self-pay | Admitting: Family Medicine

## 2016-03-05 DIAGNOSIS — I10 Essential (primary) hypertension: Secondary | ICD-10-CM

## 2016-03-05 DIAGNOSIS — E785 Hyperlipidemia, unspecified: Secondary | ICD-10-CM

## 2016-03-05 DIAGNOSIS — R739 Hyperglycemia, unspecified: Secondary | ICD-10-CM

## 2016-03-05 NOTE — Telephone Encounter (Signed)
Relation to [id number] Call back [phone]   Reason for call:  Patient was under the impressions labs where needed, please advise

## 2016-03-05 NOTE — Telephone Encounter (Signed)
Labs any time for hyperglycemia, hyperlipidemia and HTN. Hgba1c, lipid, cmp, cbc, tsh

## 2016-03-05 NOTE — Telephone Encounter (Signed)
Labs entered. Called the patient no answer/no voice mail

## 2016-03-05 NOTE — Telephone Encounter (Signed)
Patients next appt. Is on 04/05/16 and she thought she was to get labs done prior/or even tomorrow 02/25/16.... Advise -- there are not labs entered

## 2016-03-05 NOTE — Telephone Encounter (Signed)
Called and spoke to the husband and scheduled lab appointment for 03/06/16 at 3:30 PM

## 2016-03-05 NOTE — Telephone Encounter (Signed)
-----   Message from Mosie Lukes, MD sent at 03/04/2016  9:23 PM EST ----- Notify patient she has persistent hematuria. Will refer to urology for further consideration if she is not already seeing them. Please check with her

## 2016-03-06 ENCOUNTER — Other Ambulatory Visit (INDEPENDENT_AMBULATORY_CARE_PROVIDER_SITE_OTHER): Payer: 59

## 2016-03-06 ENCOUNTER — Other Ambulatory Visit: Payer: 59

## 2016-03-06 DIAGNOSIS — I10 Essential (primary) hypertension: Secondary | ICD-10-CM

## 2016-03-06 DIAGNOSIS — E785 Hyperlipidemia, unspecified: Secondary | ICD-10-CM

## 2016-03-06 DIAGNOSIS — R739 Hyperglycemia, unspecified: Secondary | ICD-10-CM

## 2016-03-06 NOTE — Progress Notes (Signed)
30 Day Event Monitor Results: No heart rhythm abnormalities. Essentially normal monitor - Mostly normal to slightly slow HR with rare PVCs.   No arrhythmias --despite ~21 triggered episodes.  Ruth Bell

## 2016-03-07 LAB — CBC
HCT: 36 % (ref 36.0–46.0)
Hemoglobin: 11.8 g/dL — ABNORMAL LOW (ref 12.0–15.0)
MCHC: 32.9 g/dL (ref 30.0–36.0)
MCV: 82.6 fl (ref 78.0–100.0)
Platelets: 256 10*3/uL (ref 150.0–400.0)
RBC: 4.36 Mil/uL (ref 3.87–5.11)
RDW: 14.1 % (ref 11.5–15.5)
WBC: 7.2 10*3/uL (ref 4.0–10.5)

## 2016-03-07 LAB — COMPREHENSIVE METABOLIC PANEL
ALT: 10 U/L (ref 0–35)
AST: 16 U/L (ref 0–37)
Albumin: 4.2 g/dL (ref 3.5–5.2)
Alkaline Phosphatase: 41 U/L (ref 39–117)
BUN: 12 mg/dL (ref 6–23)
CO2: 30 mEq/L (ref 19–32)
Calcium: 9.4 mg/dL (ref 8.4–10.5)
Chloride: 104 mEq/L (ref 96–112)
Creatinine, Ser: 0.68 mg/dL (ref 0.40–1.20)
GFR: 114.51 mL/min (ref >60.00)
Glucose, Bld: 77 mg/dL (ref 70–99)
Potassium: 4.1 mEq/L (ref 3.5–5.1)
Sodium: 143 mEq/L (ref 135–145)
Total Bilirubin: 0.5 mg/dL (ref 0.2–1.2)
Total Protein: 7 g/dL (ref 6.0–8.3)

## 2016-03-07 LAB — LIPID PANEL
Cholesterol: 257 mg/dL — ABNORMAL HIGH (ref 0–200)
HDL: 61.1 mg/dL (ref >39.00)
LDL Cholesterol: 171 mg/dL — ABNORMAL HIGH (ref 0–99)
NonHDL: 196.15
Total CHOL/HDL Ratio: 4
Triglycerides: 124 mg/dL (ref 0.0–149.0)
VLDL: 24.8 mg/dL (ref 0.0–40.0)

## 2016-03-07 LAB — TSH: TSH: 1.52 u[IU]/mL (ref 0.35–4.50)

## 2016-03-07 LAB — HEMOGLOBIN A1C: Hgb A1c MFr Bld: 6.6 % — ABNORMAL HIGH (ref 4.6–6.5)

## 2016-03-08 ENCOUNTER — Other Ambulatory Visit: Payer: Self-pay | Admitting: Family Medicine

## 2016-03-08 MED ORDER — ATORVASTATIN CALCIUM 10 MG PO TABS: 10.0000 mg | ORAL_TABLET | Freq: Every day | ORAL | 3 refills | Status: DC

## 2016-03-09 ENCOUNTER — Other Ambulatory Visit: Payer: Self-pay | Admitting: Family Medicine

## 2016-03-09 ENCOUNTER — Telehealth: Payer: Self-pay | Admitting: Family Medicine

## 2016-03-09 DIAGNOSIS — R059 Cough, unspecified: Secondary | ICD-10-CM

## 2016-03-09 DIAGNOSIS — R05 Cough: Secondary | ICD-10-CM

## 2016-03-09 NOTE — Progress Notes (Signed)
Tried to call pt phone just rings then hangs up Will try again later

## 2016-03-09 NOTE — Telephone Encounter (Signed)
° °  Pharmacy: [id number] - [place], [place] [phone]) [phone] (Fax)     Reason for call:  [facility] called requesting 90 day supply for the following medication mentioned below, please send to retail, patient is completely out, please advise  Doxycycline 100mg   ranitidine (ZANTAC) 150 MG tablet  famciclovir (FAMVIR) 500 MG tablet  cefdinir (OMNICEF) 300 MG capsule  benzonatate 100mg  pantoprazole (PROTONIX) 40 MG tablet

## 2016-03-13 ENCOUNTER — Telehealth: Payer: Self-pay | Admitting: *Deleted

## 2016-03-13 ENCOUNTER — Encounter: Payer: 59 | Admitting: Family Medicine

## 2016-03-13 NOTE — Telephone Encounter (Signed)
-----   Message from [name], MD sent at 03/06/2016  6:26 PM EST ----- 30 Day Event Monitor Results: No heart rhythm abnormalities. Essentially normal monitor - Mostly normal to slightly slow HR with rare PVCs.   No arrhythmias --despite ~21 triggered episodes.  Glenetta Hew

## 2016-03-13 NOTE — Telephone Encounter (Signed)
Spoke to patient's daughter Engineer, civil (consulting). Result given . Verbalized  Understanding. Keep appointment

## 2016-03-13 NOTE — Telephone Encounter (Signed)
-----   Message from [name], MD sent at 03/06/2016  6:26 PM EST ----- 30 Day Event Monitor Results: No heart rhythm abnormalities. Essentially normal monitor - Mostly normal to slightly slow HR with rare PVCs.   No arrhythmias --despite ~21 triggered episodes.  Ruth Bell

## 2016-03-15 MED ORDER — RANITIDINE HCL 150 MG PO TABS: 150.0000 mg | ORAL_TABLET | Freq: Two times a day (BID) | ORAL | 2 refills | Status: DC

## 2016-03-15 MED ORDER — PANTOPRAZOLE SODIUM 40 MG PO TBEC: DELAYED_RELEASE_TABLET | ORAL | 2 refills | Status: DC

## 2016-03-15 NOTE — Telephone Encounter (Signed)
Can do 90 day on Pantoprazole and Ranitidine. The other meds are not meant to be used daily as written so no 90 day supply would need an appt to discuss if she feels she wants to discuss more fully.

## 2016-03-15 NOTE — Telephone Encounter (Signed)
Advise on 90 day of benzonate perles, and 2 antibiotics

## 2016-03-15 NOTE — Addendum Note (Signed)
Addended by: Sharon Seller B on: 03/15/2016 09:02 AM   Modules accepted: Orders

## 2016-03-19 DIAGNOSIS — R0789 Other chest pain: Secondary | ICD-10-CM

## 2016-03-19 HISTORY — DX: Other chest pain: R07.89

## 2016-04-05 ENCOUNTER — Encounter: Payer: 59 | Admitting: Family Medicine

## 2016-04-10 ENCOUNTER — Encounter: Payer: Self-pay | Admitting: Family Medicine

## 2016-04-10 ENCOUNTER — Ambulatory Visit (INDEPENDENT_AMBULATORY_CARE_PROVIDER_SITE_OTHER): Payer: 59 | Admitting: Family Medicine

## 2016-04-10 VITALS — BP 132/76 | HR 67 | Temp 98.2°F | Wt 115.4 lb

## 2016-04-10 DIAGNOSIS — R0789 Other chest pain: Secondary | ICD-10-CM | POA: Diagnosis not present

## 2016-04-10 DIAGNOSIS — D509 Iron deficiency anemia, unspecified: Secondary | ICD-10-CM

## 2016-04-10 DIAGNOSIS — R1310 Dysphagia, unspecified: Secondary | ICD-10-CM | POA: Diagnosis not present

## 2016-04-10 DIAGNOSIS — R739 Hyperglycemia, unspecified: Secondary | ICD-10-CM

## 2016-04-10 DIAGNOSIS — R490 Dysphonia: Secondary | ICD-10-CM

## 2016-04-10 DIAGNOSIS — E782 Mixed hyperlipidemia: Secondary | ICD-10-CM

## 2016-04-10 DIAGNOSIS — I10 Essential (primary) hypertension: Secondary | ICD-10-CM

## 2016-04-10 MED ORDER — QUETIAPINE FUMARATE 300 MG PO TABS: 300.0000 mg | ORAL_TABLET | Freq: Every day | ORAL | 1 refills | Status: DC

## 2016-04-10 MED ORDER — PANTOPRAZOLE SODIUM 40 MG PO TBEC: 40.0000 mg | DELAYED_RELEASE_TABLET | Freq: Two times a day (BID) | ORAL | 2 refills | Status: DC

## 2016-04-10 NOTE — Progress Notes (Signed)
Pre visit review using our clinic review tool, if applicable. No additional management support is needed unless otherwise documented below in the visit note. 

## 2016-04-10 NOTE — Progress Notes (Signed)
Subjective:    Patient ID: Ruth Bell, female    DOB: [birth year], 56 y.o.   MRN: [id number]  No chief complaint on file.   HPI Patient is in today for follow up on numerous medical concerns including HTN, hyperglycemia, chest pain, hyperlipidemia, migraines and more. She is still struggling with persistent hoarseness and she notes some dysphagia with sold foods causing chest pain at times. No particular foods. No nausea or vomiting. She has not had any flares in headaches or seizures since the last visit. She feels the Seroquel has been helpful and she is feeling better and more calm. Denies CP/palp/SOB/HA/congestion/fevers/GI or GU c/o. Taking meds as prescribed  Past Medical History:  Diagnosis Date  . Anemia   . Anxiety   . Arm skin lesion, left 09/05/2014  . Constipation 12/27/2013  . Depression   . Dysphagia 04/15/2016  . Eczema 08/22/2015  . GERD (gastroesophageal reflux disease)   . Hematuria 03/04/2016  . History of shingles 01/22/2016  . Hyperglycemia 01/15/2015  . Hyperlipidemia, mixed 08/22/2015  . Hypertension   . Menometrorrhagia 2006  . Migraine, unspecified, without mention of intractable migraine without mention of status migrainosus   . Pseudoseizures   . Seizures ([place])    history of pseudoseizure disorder, last last seizure 3 wks, keppra inc in dosage  . Tubular adenoma of colon 05/2011  . Uterine leiomyoma 2006    Past Surgical History:  Procedure Laterality Date  . ABDOMINAL HYSTERECTOMY  04/06/04  . APPENDECTOMY    . BILATERAL SALPINGOOPHORECTOMY  04/06/04  . MASS EXCISION Left 06/02/2015   Procedure: EXCISION LEFT ARM MASS;  Surgeon: Autumn Messing III, MD;  Location: [facility];  Service: General;  Laterality: Left;  . ROTATOR CUFF REPAIR      Family History  Problem Relation Age of Onset  . Heart disease Mother   . Heart attack Mother   . Cancer Father     colon  . Hypertension Father   . Cancer Sister     brain  . Cancer Brother   .  Hypertension Sister   . Cancer Sister     unsure of type  . Diabetes Sister     type 2  . Heart attack Sister   . Hypertension Brother   . Colon cancer Neg Hx     Social History   Social History  . Marital status: Married    Spouse name: Gwyndolyn Saxon  . Number of children: N/A  . Years of education: N/A   Occupational History  . CMA [employer]   Social History Main Topics  . Smoking status: Former Smoker    Packs/day: 0.50    Years: 40.00    Types: Cigarettes    Start date: 12/18/2015  . Smokeless tobacco: Never Used     Comment: 3-4 cigarettes daily  . Alcohol use No  . Drug use: No  . Sexual activity: Not on file     Comment: lives with husband, no dietary restrictions.    Other Topics Concern  . Not on file   Social History Narrative   Lives with her husband and their son.   Daughter lives in [place], [place].    Outpatient Medications Prior to Visit  Medication Sig Dispense Refill  . ALPRAZolam (XANAX) 0.5 MG tablet Take 1 tablet (0.5 mg total) by mouth 2 (two) times daily as needed for anxiety. 60 tablet 3  . atorvastatin (LIPITOR) 10 MG tablet Take 1 tablet (10  mg total) by mouth daily. 30 tablet 3  . Azelastine HCl 0.15 % SOLN Place 2 sprays into both nostrils 2 (two) times daily. 30 mL 0  . benzonatate (TESSALON) 100 MG capsule TAKE 1-2 CAPSULES BY MOUTH  3 TIMES DAILY AS NEEDED FOR COUGH. 40 capsule 0  . citalopram (CELEXA) 20 MG tablet TAKE 1 TABLET (20 MG TOTAL) BY MOUTH DAILY. 90 tablet 2  . cyclobenzaprine (FLEXERIL) 10 MG tablet Take 1 tablet (10 mg total) by mouth 3 (three) times daily as needed for muscle spasms. 90 tablet 1  . famciclovir (FAMVIR) 500 MG tablet Take 1 tablet (500 mg total) by mouth 3 (three) times daily. 21 tablet 0  . gabapentin (NEURONTIN) 600 MG tablet Take 1 tablet (600 mg total) by mouth 3 (three) times daily. Reported on 05/26/2015 90 tablet 1  . Guaifenesin (MUCINEX MAXIMUM STRENGTH) 1200 MG TB12 Take 1 tablet (1,200 mg total)  by mouth every 12 (twelve) hours as needed. 14 tablet 1  . levETIRAcetam (KEPPRA) 500 MG tablet TAKE 1 TABLET (500 MG TOTAL) BY MOUTH TWO (2) TIMES A DAY.  3  . metoprolol (LOPRESSOR) 50 MG tablet Take 1 tablet (50 mg total) by mouth 2 (two) times daily. 60 tablet 3  . ondansetron (ZOFRAN ODT) 4 MG disintegrating tablet Take 1 tablet (4 mg total) by mouth every 8 (eight) hours as needed for nausea or vomiting. 20 tablet 0  . ranitidine (ZANTAC) 150 MG tablet Take 1 tablet (150 mg total) by mouth 2 (two) times daily. 180 tablet 2  . cefdinir (OMNICEF) 300 MG capsule TAKE 1 CAPSULE BY MOUTH TWO TIMES DAILY 20 capsule 0  . famciclovir (FAMVIR) 500 MG tablet TAKE 1 TABLET BY MOUTH 3  TIMES DAILY 21 tablet 0  . hydrOXYzine (ATARAX/VISTARIL) 10 MG tablet Take 1 tablet (10 mg total) by mouth every 8 (eight) hours as needed for itching. 21 tablet 0  . oxyCODONE-acetaminophen (ROXICET) 5-325 MG tablet Take 1-2 tablets by mouth every 4 (four) hours as needed. 30 tablet 0  . pantoprazole (PROTONIX) 40 MG tablet TAKE 1 TABLET (40 MG TOTAL) BY MOUTH DAILY. 90 tablet 2  . QUEtiapine (SEROQUEL) 100 MG tablet 1/2 tab po qhs x 1 then 1 tab po qhs x 1 then 2 tabs po qhs x 1 then 3 tabs po qhs 90 tablet 1  . triamcinolone cream (KENALOG) 0.1 % Apply 1 application topically 2 (two) times daily. 45 g 0   No facility-administered medications prior to visit.     No Known Allergies  Review of Systems  Constitutional: Negative for fever and malaise/fatigue.  HENT: Positive for congestion.   Eyes: Negative for blurred vision.  Respiratory: Negative for cough and shortness of breath.   Cardiovascular: Positive for chest pain. Negative for palpitations and leg swelling.  Gastrointestinal: Positive for heartburn. Negative for vomiting.  Musculoskeletal: Negative for back pain.  Skin: Negative for rash.  Neurological: Negative for loss of consciousness and headaches.       Objective:    Physical Exam    Constitutional: She is oriented to person, place, and time. She appears well-developed and well-nourished. No distress.  HENT:  Head: Normocephalic and atraumatic.  Oropharynx erythematous, mild edema in throat noted.  Eyes: Conjunctivae are normal.  Neck: Normal range of motion. No thyromegaly present.  Cardiovascular: Normal rate and regular rhythm.   Pulmonary/Chest: Effort normal and breath sounds normal. She has no wheezes.  Abdominal: Soft. Bowel sounds are normal. There is  no tenderness.  Musculoskeletal: Normal range of motion. She exhibits no edema or deformity.  Neurological: She is alert and oriented to person, place, and time.  Skin: Skin is warm and dry. She is not diaphoretic.  Psychiatric: She has a normal mood and affect.    BP 132/76 (BP Location: Right Arm, Patient Position: Sitting, Cuff Size: Normal)   Pulse 67   Temp 98.2 F (36.8 C) (Oral)   Wt 115 lb 6.4 oz (52.3 kg)   LMP  (LMP Unknown)   SpO2 97%   BMI 21.11 kg/m  Wt Readings from Last 3 Encounters:  04/13/16 116 lb 3.2 oz (52.7 kg)  04/10/16 115 lb 6.4 oz (52.3 kg)  02/21/16 106 lb 2 oz (48.1 kg)     Lab Results  Component Value Date   WBC 7.2 03/06/2016   HGB 11.8 (L) 03/06/2016   HCT 36.0 03/06/2016   PLT 256.0 03/06/2016   GLUCOSE 77 03/06/2016   CHOL 257 (H) 03/06/2016   TRIG 124.0 03/06/2016   HDL 61.10 03/06/2016   LDLCALC 171 (H) 03/06/2016   ALT 10 03/06/2016   AST 16 03/06/2016   NA 143 03/06/2016   K 4.1 03/06/2016   CL 104 03/06/2016   CREATININE 0.68 03/06/2016   BUN 12 03/06/2016   CO2 30 03/06/2016   TSH 1.52 03/06/2016   INR 1.0 11/19/2013   HGBA1C 6.6 (H) 03/06/2016    Lab Results  Component Value Date   TSH 1.52 03/06/2016   Lab Results  Component Value Date   WBC 7.2 03/06/2016   HGB 11.8 (L) 03/06/2016   HCT 36.0 03/06/2016   MCV 82.6 03/06/2016   PLT 256.0 03/06/2016   Lab Results  Component Value Date   NA 143 03/06/2016   K 4.1 03/06/2016   CO2 30  03/06/2016   GLUCOSE 77 03/06/2016   BUN 12 03/06/2016   CREATININE 0.68 03/06/2016   BILITOT 0.5 03/06/2016   ALKPHOS 41 03/06/2016   AST 16 03/06/2016   ALT 10 03/06/2016   PROT 7.0 03/06/2016   ALBUMIN 4.2 03/06/2016   CALCIUM 9.4 03/06/2016   ANIONGAP 7 01/10/2016   GFR 114.51 03/06/2016   Lab Results  Component Value Date   CHOL 257 (H) 03/06/2016   Lab Results  Component Value Date   HDL 61.10 03/06/2016   Lab Results  Component Value Date   LDLCALC 171 (H) 03/06/2016   Lab Results  Component Value Date   TRIG 124.0 03/06/2016   Lab Results  Component Value Date   CHOLHDL 4 03/06/2016   Lab Results  Component Value Date   HGBA1C 6.6 (H) 03/06/2016       Assessment & Plan:   Problem List Items Addressed This Visit    Essential hypertension (Chronic)    Well controlled, no changes to meds. Encouraged heart healthy diet such as the DASH diet and exercise as tolerated.       Relevant Orders   CBC   Comprehensive metabolic panel   TSH   CBC   Comprehensive metabolic panel   TSH   Lipid panel   Anemia   Hyperglycemia    hgba1c acceptable, minimize simple carbs. Increase exercise as tolerated. Continue current meds      Relevant Orders   Hemoglobin A1c   Hemoglobin A1c   Hyperlipidemia, mixed    Encouraged heart healthy diet, increase exercise, avoid trans fats, consider a krill oil cap daily      Relevant Orders  Lipid panel   Lipid panel   Hoarseness - Primary    Persistent hoarseness referred to gastroenterology and ear, nose and throat for further investigation.      Relevant Orders   Ambulatory referral to ENT   Dysphagia    Is noting some chest discomfort at times when eating and some trouble with food catching when she eats at times. No obvious pattern as to the type of food. Continue pantoprazole qd, increase Ranitidine to bid since she has only been taking it once daily. Also avoid offending foods and add a probiotic, referred to  gastroenterology for frther consideration.       Relevant Orders   Ambulatory referral to Gastroenterology    Other Visit Diagnoses    Atypical chest pain       Relevant Orders   Ambulatory referral to ENT   Ambulatory referral to Gastroenterology      I have discontinued Ms. Mokry's QUEtiapine and cefdinir. I have also changed her pantoprazole. Additionally, I am having her start on QUEtiapine. Lastly, I am having her maintain her cyclobenzaprine, levETIRAcetam, Azelastine HCl, Guaifenesin, citalopram, gabapentin, metoprolol, ondansetron, ALPRAZolam, famciclovir, atorvastatin, benzonatate, and ranitidine.  Meds ordered this encounter  Medications  . QUEtiapine (SEROQUEL) 300 MG tablet    Sig: Take 1 tablet (300 mg total) by mouth at bedtime.    Dispense:  90 tablet    Refill:  1  . pantoprazole (PROTONIX) 40 MG tablet    Sig: Take 1 tablet (40 mg total) by mouth 2 (two) times daily. TAKE 1 TABLET (40 MG TOTAL) BY MOUTH DAILY.    Dispense:  180 tablet    Refill:  2    CMA served as scribe during this visit. History, Physical and Plan performed by medical provider. Documentation and orders reviewed and attested to.  [name], MD

## 2016-04-12 ENCOUNTER — Encounter: Payer: Self-pay | Admitting: Family Medicine

## 2016-04-13 ENCOUNTER — Ambulatory Visit: Payer: 59 | Admitting: Cardiology

## 2016-04-13 ENCOUNTER — Ambulatory Visit (INDEPENDENT_AMBULATORY_CARE_PROVIDER_SITE_OTHER): Payer: 59 | Admitting: Cardiology

## 2016-04-13 ENCOUNTER — Encounter: Payer: Self-pay | Admitting: Cardiology

## 2016-04-13 VITALS — BP 130/77 | HR 67 | Ht 62.0 in | Wt 116.2 lb

## 2016-04-13 DIAGNOSIS — I10 Essential (primary) hypertension: Secondary | ICD-10-CM | POA: Diagnosis not present

## 2016-04-13 DIAGNOSIS — R0600 Dyspnea, unspecified: Secondary | ICD-10-CM

## 2016-04-13 DIAGNOSIS — R0609 Other forms of dyspnea: Secondary | ICD-10-CM | POA: Diagnosis not present

## 2016-04-13 DIAGNOSIS — R0789 Other chest pain: Secondary | ICD-10-CM | POA: Insufficient documentation

## 2016-04-13 DIAGNOSIS — R079 Chest pain, unspecified: Secondary | ICD-10-CM | POA: Diagnosis not present

## 2016-04-13 DIAGNOSIS — R002 Palpitations: Secondary | ICD-10-CM | POA: Diagnosis not present

## 2016-04-13 DIAGNOSIS — R55 Syncope and collapse: Secondary | ICD-10-CM

## 2016-04-13 NOTE — Progress Notes (Signed)
PCP: [name], MD  Clinic Note: Chief Complaint  Patient presents with  . Follow-up    2 month;Echo & event monitor  . Dizziness    when laying on left side of the body.  . Chest Pain    HPI: Ruth Bell is a 56 y.o. female with a PMH below who presents today for Follow-up of syncopal episode.Ruth Bell was last seen on January 30 2016 for initial consultation. While I was seeing her she was minimally responsive having had a "seizure type episode while having her blood pressure check orthostasis.  Recent Hospitalizations: n/a  Studies Reviewed:   30 day event monitor did not show any rhythm abnormalities. Essentially normal with no arrhythmias despite treatment episodes. PVCs  2-D echocardiogram was grossly normal EF 55%. Normal valve function. No regional wall motion abnormality.  Interval History: Ruth Bell presents today actually feeling much more like her normal self today. She's not having a seizure-like episode today.  I'm actually able to get a history from her today, and she now is very forthcoming with several symptoms including symptoms that sound like orthopnea with inability to lie flat. Heaviness in short of breath. She gets short of breath with chest tightness. This can happen at rest, usually when laying down. She also then noted occasionally with exertion. She says the passing out episodes and palpitations are much better than they had been. She is being evaluated by ENT for her sore coarse voice.   Cardiovascular ROS: positive for - chest pain, dyspnea on exertion, irregular heartbeat, orthopnea, paroxysmal nocturnal dyspnea and shortness of breath negative for - edema, loss of consciousness, murmur or TIA/amarosis fugax.  True syncope   ROS: A comprehensive was performed. Review of Systems  Constitutional: Positive for malaise/fatigue and weight loss.  HENT: Positive for sore throat.        Hoarse voice   Respiratory: Positive for shortness of  breath and wheezing.   Musculoskeletal: Positive for falls and joint pain.  Neurological: Positive for dizziness, tremors and seizures (siezure-like episodes - ? pseudoseizure).  Endo/Heme/Allergies: Does not bruise/bleed easily.  Psychiatric/Behavioral: The patient is nervous/anxious and has insomnia.     Past Medical History:  Diagnosis Date  . Anemia   . Anxiety   . Arm skin lesion, left 09/05/2014  . Constipation 12/27/2013  . Depression   . Dysphagia 04/15/2016  . Eczema 08/22/2015  . GERD (gastroesophageal reflux disease)   . Hematuria 03/04/2016  . History of shingles 01/22/2016  . Hyperglycemia 01/15/2015  . Hyperlipidemia, mixed 08/22/2015  . Hypertension   . Menometrorrhagia 2006  . Migraine, unspecified, without mention of intractable migraine without mention of status migrainosus   . Pseudoseizures   . Seizures ([place])    history of pseudoseizure disorder, last last seizure 3 wks, keppra inc in dosage  . Tubular adenoma of colon 05/2011  . Uterine leiomyoma 2006    Past Surgical History:  Procedure Laterality Date  . ABDOMINAL HYSTERECTOMY  04/06/04  . APPENDECTOMY    . BILATERAL SALPINGOOPHORECTOMY  04/06/04  . MASS EXCISION Left 06/02/2015   Procedure: EXCISION LEFT ARM MASS;  Surgeon: Autumn Messing III, MD;  Location: [facility];  Service: General;  Laterality: Left;  . ROTATOR CUFF REPAIR      Current Meds  Medication Sig  . ALPRAZolam (XANAX) 0.5 MG tablet Take 1 tablet (0.5 mg total) by mouth 2 (two) times daily as needed for anxiety.  Marland Kitchen atorvastatin (LIPITOR) 10 MG  tablet Take 1 tablet (10 mg total) by mouth daily.  . Azelastine HCl 0.15 % SOLN Place 2 sprays into both nostrils 2 (two) times daily.  . baclofen (LIORESAL) 10 MG tablet TAKE 1/2 TABLET BY MOUTH 3 TIMES A DAY  . benzonatate (TESSALON) 100 MG capsule TAKE 1-2 CAPSULES BY MOUTH  3 TIMES DAILY AS NEEDED FOR COUGH.  . cefdinir (OMNICEF) 300 MG capsule Take 300 mg by mouth 2 (two) times daily.    . citalopram (CELEXA) 20 MG tablet TAKE 1 TABLET (20 MG TOTAL) BY MOUTH DAILY.  . cyclobenzaprine (FLEXERIL) 10 MG tablet Take 1 tablet (10 mg total) by mouth 3 (three) times daily as needed for muscle spasms.  . famciclovir (FAMVIR) 500 MG tablet Take 1 tablet (500 mg total) by mouth 3 (three) times daily.  Marland Kitchen gabapentin (NEURONTIN) 600 MG tablet Take 1 tablet (600 mg total) by mouth 3 (three) times daily. Reported on 05/26/2015  . Guaifenesin (MUCINEX MAXIMUM STRENGTH) 1200 MG TB12 Take 1 tablet (1,200 mg total) by mouth every 12 (twelve) hours as needed.  . levETIRAcetam (KEPPRA) 500 MG tablet TAKE 1 TABLET (500 MG TOTAL) BY MOUTH TWO (2) TIMES A DAY.  . metoprolol (LOPRESSOR) 50 MG tablet Take 1 tablet (50 mg total) by mouth 2 (two) times daily.  . ondansetron (ZOFRAN ODT) 4 MG disintegrating tablet Take 1 tablet (4 mg total) by mouth every 8 (eight) hours as needed for nausea or vomiting.  . pantoprazole (PROTONIX) 40 MG tablet Take 1 tablet (40 mg total) by mouth 2 (two) times daily. TAKE 1 TABLET (40 MG TOTAL) BY MOUTH DAILY.  Marland Kitchen QUEtiapine (SEROQUEL) 300 MG tablet Take 1 tablet (300 mg total) by mouth at bedtime.  . ranitidine (ZANTAC) 150 MG tablet Take 1 tablet (150 mg total) by mouth 2 (two) times daily.  Marland Kitchen tiZANidine (ZANAFLEX) 4 MG tablet Take 4 mg by mouth every 8 (eight) hours as needed for muscle spasms.    No Known Allergies  Social History   Social History  . Marital status: Married    Spouse name: Gwyndolyn Saxon  . Number of children: N/A  . Years of education: N/A   Occupational History  . CMA [employer]   Social History Main Topics  . Smoking status: Former Smoker    Packs/day: 0.50    Years: 40.00    Types: Cigarettes    Start date: 12/18/2015  . Smokeless tobacco: Never Used     Comment: 3-4 cigarettes daily  . Alcohol use No  . Drug use: No  . Sexual activity: Not Asked     Comment: lives with husband, no dietary restrictions.    Other Topics Concern   . None   Social History Narrative   Lives with her husband and their son.   Daughter lives in [place], [place].    family history includes Cancer in her brother, father, sister, and sister; Diabetes in her sister; Heart attack in her mother and sister; Heart disease in her mother; Hypertension in her brother, father, and sister.  Wt Readings from Last 3 Encounters:  04/13/16 52.7 kg (116 lb 3.2 oz)  04/10/16 52.3 kg (115 lb 6.4 oz)  02/21/16 48.1 kg (106 lb 2 oz)    PHYSICAL EXAM BP 130/77   Pulse 67   Ht 5\' 2"  (1.575 m)   Wt 52.7 kg (116 lb 3.2 oz)   LMP  (LMP Unknown)   BMI 21.25 kg/m  General appearance: alert, cooperative, appears  stated age, no distress. Thin, but not frail.  Very scratchy/hoarse voice Neck: no adenopathy, no carotid bruit and no JVD Lungs: clear to auscultation bilaterally, normal percussion bilaterally and non-labored Heart: regular rate and rhythm, S1& S2 normal, no murmur, click, rub or gallop Abdomen: soft, non-tender; bowel sounds normal; no masses,  no organomegaly; no HJR Extremities: extremities normal, atraumatic, no cyanosis, or edema Pulses: 2+ and symmetric; Skin: mobility and turgor normal, no evidence of bleeding or bruising and no lesions noted or  Neurologic: Mental status: Alert, oriented, thought content appropriate.  unusual affect    Adult ECG Report n/a  Other studies Reviewed: Additional studies/ records that were reviewed today include:  Recent Labs:  n/a     ASSESSMENT / PLAN: Problem List Items Addressed This Visit    SYNCOPE (Chronic)    No arrhyhtmia on monitor despite having "episodes" that looked to me (during last visit) consistent with pseudoseizures. Normal Echo.  Unlikely cardiac      Relevant Orders   Myocardial Perfusion Imaging   Chest pain with moderate risk for cardiac etiology - Primary (Chronic)    Since last visit was less than helpful from a standpoint of obtaining a history from her, she was very  forthcoming - her symptoms are somewhat atypical, but when associated with DOE,  & exacerbated by exercise,  Cannot exclude ischemic angina pain.  Plan: Myoview      Relevant Orders   Myocardial Perfusion Imaging   Essential hypertension (Chronic)    Stable - controlled      Relevant Orders   Myocardial Perfusion Imaging   Palpitations (Chronic)    Rare ectopy - already on BB.      Relevant Orders   Myocardial Perfusion Imaging   Exertional dyspnea    This is also a new symptom that I'm obtaining from her. Her echoes are normal. Plan is to check a Myoview to exclude ischemia. I think it may be related to her ENT issues. However, she does have hypertension and dyslipidemia and also some exertional chest pain which warrant ischemic evaluation. Plan: Myoview.      Relevant Orders   Myocardial Perfusion Imaging      Current medicines are reviewed at length with the patient today. (+/- concerns) n/a The following changes have been made: n/a  Patient Instructions  SCHEDULE AT [facility] has requested that you have en exercise stress myoview. For further information please visit [email or web address]. Please follow instruction sheet, as given.      Your physician recommends that you schedule a follow-up appointment in AFTER TEST IS COMPLETED    Studies Ordered:   Orders Placed This Encounter  Procedures  . Myocardial Perfusion Imaging      Glenetta Hew, M.D., M.S. Interventional Cardiologist   Pager # [phone] Phone # [phone] [name] [place], [place] [address]

## 2016-04-13 NOTE — Patient Instructions (Signed)
SCHEDULE AT [facility] has requested that you have en exercise stress myoview. For further information please visit [email or web address]. Please follow instruction sheet, as given.      Your physician recommends that you schedule a follow-up appointment in AFTER TEST IS COMPLETED

## 2016-04-15 ENCOUNTER — Encounter: Payer: Self-pay | Admitting: Family Medicine

## 2016-04-15 ENCOUNTER — Encounter: Payer: Self-pay | Admitting: Cardiology

## 2016-04-15 DIAGNOSIS — R1310 Dysphagia, unspecified: Secondary | ICD-10-CM

## 2016-04-15 DIAGNOSIS — R490 Dysphonia: Secondary | ICD-10-CM | POA: Insufficient documentation

## 2016-04-15 HISTORY — DX: Dysphagia, unspecified: R13.10

## 2016-04-15 NOTE — Assessment & Plan Note (Signed)
Rare ectopy - already on BB.

## 2016-04-15 NOTE — Assessment & Plan Note (Signed)
Stable  controlled

## 2016-04-15 NOTE — Assessment & Plan Note (Signed)
hgba1c acceptable, minimize simple carbs. Increase exercise as tolerated. Continue current meds 

## 2016-04-15 NOTE — Assessment & Plan Note (Signed)
Is noting some chest discomfort at times when eating and some trouble with food catching when she eats at times. No obvious pattern as to the type of food. Continue pantoprazole qd, increase Ranitidine to bid since she has only been taking it once daily. Also avoid offending foods and add a probiotic, referred to gastroenterology for frther consideration.

## 2016-04-15 NOTE — Assessment & Plan Note (Signed)
Well controlled, no changes to meds. Encouraged heart healthy diet such as the DASH diet and exercise as tolerated.  °

## 2016-04-15 NOTE — Assessment & Plan Note (Signed)
Since last visit was less than helpful from a standpoint of obtaining a history from her, she was very forthcoming - her symptoms are somewhat atypical, but when associated with DOE,  & exacerbated by exercise,  Cannot exclude ischemic angina pain.  Plan: Myoview

## 2016-04-15 NOTE — Assessment & Plan Note (Signed)
No arrhyhtmia on monitor despite having "episodes" that looked to me (during last visit) consistent with pseudoseizures. Normal Echo.  Unlikely cardiac

## 2016-04-15 NOTE — Patient Instructions (Signed)
Carbohydrate Counting for Diabetes Mellitus, Adult Carbohydrate counting is a method for keeping track of how many carbohydrates you eat. Eating carbohydrates naturally increases the amount of sugar (glucose) in the blood. Counting how many carbohydrates you eat helps keep your blood glucose within normal limits, which helps you manage your diabetes (diabetes mellitus). It is important to know how many carbohydrates you can safely have in each meal. This is different for every person. A diet and nutrition specialist (registered dietitian) can help you make a meal plan and calculate how many carbohydrates you should have at each meal and snack. Carbohydrates are found in the following foods:  Grains, such as breads and cereals.  Dried beans and soy products.  Starchy vegetables, such as potatoes, peas, and corn.  Fruit and fruit juices.  Milk and yogurt.  Sweets and snack foods, such as cake, cookies, candy, chips, and soft drinks. How do I count carbohydrates? There are two ways to count carbohydrates in food. You can use either of the methods or a combination of both. Reading "Nutrition Facts" on packaged food  The "Nutrition Facts" list is included on the labels of almost all packaged foods and beverages in the [place] It includes:  The serving size.  Information about nutrients in each serving, including the grams (g) of carbohydrate per serving. To use the "Nutrition Facts":  Decide how many servings you will have.  Multiply the number of servings by the number of carbohydrates per serving.  The resulting number is the total amount of carbohydrates that you will be having. Learning standard serving sizes of other foods  When you eat foods containing carbohydrates that are not packaged or do not include "Nutrition Facts" on the label, you need to measure the servings in order to count the amount of carbohydrates:  Measure the foods that you will eat with a food scale or measuring  cup, if needed.  Decide how many standard-size servings you will eat.  Multiply the number of servings by 15. Most carbohydrate-rich foods have about 15 g of carbohydrates per serving.  For example, if you eat 8 oz (170 g) of strawberries, you will have eaten 2 servings and 30 g of carbohydrates (2 servings x 15 g = 30 g).  For foods that have more than one food mixed, such as soups and casseroles, you must count the carbohydrates in each food that is included. The following list contains standard serving sizes of common carbohydrate-rich foods. Each of these servings has about 15 g of carbohydrates:   hamburger bun or  English muffin.   oz (15 mL) syrup.   oz (14 g) jelly.  1 slice of bread.  1 six-inch tortilla.  3 oz (85 g) cooked rice or pasta.  4 oz (113 g) cooked dried beans.  4 oz (113 g) starchy vegetable, such as peas, corn, or potatoes.  4 oz (113 g) hot cereal.  4 oz (113 g) mashed potatoes or  of a large baked potato.  4 oz (113 g) canned or frozen fruit.  4 oz (120 mL) fruit juice.  4-6 crackers.  6 chicken nuggets.  6 oz (170 g) unsweetened dry cereal.  6 oz (170 g) plain fat-free yogurt or yogurt sweetened with artificial sweeteners.  8 oz (240 mL) milk.  8 oz (170 g) fresh fruit or one small piece of fruit.  24 oz (680 g) popped popcorn. Example of carbohydrate counting Sample meal  3 oz (85 g) chicken breast.  6 oz (  170 g) brown rice.  4 oz (113 g) corn.  8 oz (240 mL) milk.  8 oz (170 g) strawberries with sugar-free whipped topping. Carbohydrate calculation 1. Identify the foods that contain carbohydrates:  Rice.  Corn.  Milk.  Strawberries. 2. Calculate how many servings you have of each food:  2 servings rice.  1 serving corn.  1 serving milk.  1 serving strawberries. 3. Multiply each number of servings by 15 g:  2 servings rice x 15 g = 30 g.  1 serving corn x 15 g = 15 g.  1 serving milk x 15 g = 15  g.  1 serving strawberries x 15 g = 15 g. 4. Add together all of the amounts to find the total grams of carbohydrates eaten:  30 g + 15 g + 15 g + 15 g = 75 g of carbohydrates total. This information is not intended to replace advice given to you by your health care provider. Make sure you discuss any questions you have with your health care provider. Document Released: 03/05/2005 Document Revised: 09/23/2015 Document Reviewed: 08/17/2015 Elsevier Interactive Patient Education  2017 Elsevier Inc.  

## 2016-04-15 NOTE — Assessment & Plan Note (Signed)
Encouraged heart healthy diet, increase exercise, avoid trans fats, consider a krill oil cap daily 

## 2016-04-15 NOTE — Assessment & Plan Note (Signed)
This is also a new symptom that I'm obtaining from her. Her echoes are normal. Plan is to check a Myoview to exclude ischemia. I think it may be related to her ENT issues. However, she does have hypertension and dyslipidemia and also some exertional chest pain which warrant ischemic evaluation. Plan: Myoview.

## 2016-04-15 NOTE — Assessment & Plan Note (Signed)
Persistent hoarseness referred to gastroenterology and ear, nose and throat for further investigation.

## 2016-04-18 ENCOUNTER — Telehealth (HOSPITAL_COMMUNITY): Payer: Self-pay

## 2016-04-18 NOTE — Telephone Encounter (Signed)
Encounter complete. 

## 2016-04-19 HISTORY — PX: NM MYOVIEW LTD: HXRAD82

## 2016-04-20 ENCOUNTER — Telehealth: Payer: Self-pay

## 2016-04-20 ENCOUNTER — Ambulatory Visit (HOSPITAL_COMMUNITY)
Admission: RE | Admit: 2016-04-20 | Discharge: 2016-04-20 | Disposition: A | Discharge status: Home / Self Care | Payer: 59 | Source: Ambulatory Visit | Attending: Cardiology | Admitting: Cardiology

## 2016-04-20 DIAGNOSIS — R569 Unspecified convulsions: Secondary | ICD-10-CM | POA: Diagnosis not present

## 2016-04-20 DIAGNOSIS — Z8249 Family history of ischemic heart disease and other diseases of the circulatory system: Secondary | ICD-10-CM | POA: Diagnosis not present

## 2016-04-20 DIAGNOSIS — R002 Palpitations: Secondary | ICD-10-CM | POA: Insufficient documentation

## 2016-04-20 DIAGNOSIS — R079 Chest pain, unspecified: Secondary | ICD-10-CM | POA: Diagnosis not present

## 2016-04-20 DIAGNOSIS — R55 Syncope and collapse: Secondary | ICD-10-CM | POA: Diagnosis not present

## 2016-04-20 DIAGNOSIS — R0600 Dyspnea, unspecified: Secondary | ICD-10-CM

## 2016-04-20 DIAGNOSIS — R0609 Other forms of dyspnea: Secondary | ICD-10-CM | POA: Diagnosis present

## 2016-04-20 DIAGNOSIS — Z87891 Personal history of nicotine dependence: Secondary | ICD-10-CM | POA: Insufficient documentation

## 2016-04-20 DIAGNOSIS — I10 Essential (primary) hypertension: Secondary | ICD-10-CM | POA: Insufficient documentation

## 2016-04-20 LAB — MYOCARDIAL PERFUSION IMAGING
LV dias vol: 67 mL (ref 46–106)
LV sys vol: 25 mL
Peak HR: 127 {beats}/min
Rest HR: 74 {beats}/min
SDS: 2
SRS: 0
SSS: 2
TID: 1.04

## 2016-04-20 MED ORDER — REGADENOSON 0.4 MG/5ML IV SOLN
0.4000 mg | Freq: Once | INTRAVENOUS | Status: AC
  Administered 2016-04-20: 0.4 mg via INTRAVENOUS

## 2016-04-20 MED ORDER — TECHNETIUM TC 99M TETROFOSMIN IV KIT
11.0000 | PACK | Freq: Once | INTRAVENOUS | Status: AC | PRN
  Administered 2016-04-20: 11 via INTRAVENOUS
  Filled 2016-04-20: qty 11

## 2016-04-20 MED ORDER — TECHNETIUM TC 99M TETROFOSMIN IV KIT
32.7000 | PACK | Freq: Once | INTRAVENOUS | Status: AC | PRN
  Administered 2016-04-20: 32.7 via INTRAVENOUS
  Filled 2016-04-20: qty 33

## 2016-04-20 NOTE — Telephone Encounter (Signed)
Patient failed her routine drug screen. Per Dr. Charlett Blake she will discontinue Alprazolam (xanax) for 3 months. Patient will come in for a repeat UDS. Patient has been informed of results, she voices her understanding.  PC

## 2016-04-23 ENCOUNTER — Telehealth: Payer: Self-pay | Admitting: *Deleted

## 2016-04-23 NOTE — Telephone Encounter (Signed)
Left message with family member for patient to call back.

## 2016-04-23 NOTE — Telephone Encounter (Signed)
-----   Message from [name], MD sent at 04/20/2016  4:48 PM EST ----- Low Risk - normal Stress Test.  Unlikely that CP is cardiac.  Glenetta Hew, MD

## 2016-04-24 NOTE — Telephone Encounter (Signed)
Phone rings no answer or VM pickup.

## 2016-04-24 NOTE — Telephone Encounter (Signed)
F/u Message ° °Pt returning RN call. Please call back to discuss  °

## 2016-04-25 NOTE — Progress Notes (Signed)
Patint already has a neurologist in [facility], so left without being seen

## 2016-04-25 NOTE — Telephone Encounter (Signed)
Spoke to patient. Result given . Verbalized understanding  

## 2016-05-18 ENCOUNTER — Encounter: Payer: Self-pay | Admitting: Cardiology

## 2016-05-18 ENCOUNTER — Ambulatory Visit (INDEPENDENT_AMBULATORY_CARE_PROVIDER_SITE_OTHER): Payer: 59 | Admitting: Cardiology

## 2016-05-18 VITALS — BP 142/90 | HR 76 | Ht 62.0 in | Wt 124.0 lb

## 2016-05-18 DIAGNOSIS — I10 Essential (primary) hypertension: Secondary | ICD-10-CM | POA: Diagnosis not present

## 2016-05-18 DIAGNOSIS — R55 Syncope and collapse: Secondary | ICD-10-CM

## 2016-05-18 DIAGNOSIS — R0789 Other chest pain: Secondary | ICD-10-CM | POA: Diagnosis not present

## 2016-05-18 DIAGNOSIS — R002 Palpitations: Secondary | ICD-10-CM | POA: Diagnosis not present

## 2016-05-18 NOTE — Patient Instructions (Signed)
No medication changes  Your physician wants you to follow-up in: 6 months with Dr. Ellyn Hack. You will receive a reminder letter in the mail two months in advance. If you don't receive a letter, please call our office to schedule the follow-up appointment.

## 2016-05-18 NOTE — Progress Notes (Signed)
PCP: [name], MD  Clinic Note: Chief Complaint  Patient presents with  . Follow-up    F/U after exercise myoview.  . Shortness of Breath  . Chest Pain    @ rest & exertion  . Headache    HPI: Ruth Bell is a 56 y.o. female with a PMH below who presents today for One-month follow-up after having a nuclear stress test. She is having some syncopal episodes. Initially saw her back in November 2017 or seizure-like episodes. She definitely monitor and 2-D echo. She didn't notice having some chest tightness and dyspnea had a stress test ordered.Ruth Bell was last seen on 04/13/2016.  Recent Hospitalizations: None  Studies Reviewed:  Myoview stress test 04/20/2013: Normal LV function with EF 60%. LOW RISK. Normal  Interval History: Since her last visit, Ruth Bell has had intermittent episodes of chest discomfort that is both at rest and with exertion. She had an episode while she was talking to me. Is located just along left border of her sternum and she can point to it with 2 or 3 fingers. It is associated sometimes with taking a deep inspiration and with certain movements. She has not had any further of her syncopal episodes. No rapid irregular heartbeats palpitations. She denies any resting or exertional dyspnea. She is just ready to get her voice back    No PND, orthopnea or edema. No palpitations, lightheadedness, dizziness, weakness or syncope/near syncope. No TIA/amaurosis fugax symptoms. No claudication.  ROS: A comprehensive was performed. Review of Systems  HENT: Negative for nosebleeds.   Cardiovascular: Positive for chest pain.  Gastrointestinal: Negative for blood in stool and melena.  Musculoskeletal: Positive for back pain and neck pain. Negative for falls.  Neurological: Positive for dizziness (Somewhat positional) and seizures (As far as can tell, none since last visit).  Psychiatric/Behavioral: The patient is nervous/anxious.   All other systems  reviewed and are negative.   Past Medical History:  Diagnosis Date  . Anemia   . Anxiety   . Arm skin lesion, left 09/05/2014  . Constipation 12/27/2013  . Depression   . Dysphagia 04/15/2016  . Eczema 08/22/2015  . GERD (gastroesophageal reflux disease)   . Hematuria 03/04/2016  . History of shingles 01/22/2016  . Hyperglycemia 01/15/2015  . Hyperlipidemia, mixed 08/22/2015  . Hypertension   . Menometrorrhagia 2006  . Migraine, unspecified, without mention of intractable migraine without mention of status migrainosus   . Pseudoseizures   . Seizures ([place])    history of pseudoseizure disorder, last last seizure 3 wks, keppra inc in dosage  . Tubular adenoma of colon 05/2011  . Uterine leiomyoma 2006    Past Surgical History:  Procedure Laterality Date  . ABDOMINAL HYSTERECTOMY  04/06/04  . APPENDECTOMY    . BILATERAL SALPINGOOPHORECTOMY  04/06/04  . MASS EXCISION Left 06/02/2015   Procedure: EXCISION LEFT ARM MASS;  Surgeon: Autumn Messing III, MD;  Location: [place];  Service: General;  Laterality: Left;  . ROTATOR CUFF REPAIR      Current Meds  Medication Sig  . ALPRAZolam (XANAX) 0.5 MG tablet Take 1 tablet (0.5 mg total) by mouth 2 (two) times daily as needed for anxiety.  Marland Kitchen atorvastatin (LIPITOR) 10 MG tablet Take 1 tablet (10 mg total) by mouth daily.  . Azelastine HCl 0.15 % SOLN Place 2 sprays into both nostrils 2 (two) times daily.  . baclofen (LIORESAL) 10 MG tablet TAKE 1/2 TABLET BY MOUTH 3 TIMES A  DAY  . benzonatate (TESSALON) 100 MG capsule TAKE 1-2 CAPSULES BY MOUTH  3 TIMES DAILY AS NEEDED FOR COUGH.  . cefdinir (OMNICEF) 300 MG capsule Take 300 mg by mouth 2 (two) times daily.  . citalopram (CELEXA) 20 MG tablet TAKE 1 TABLET (20 MG TOTAL) BY MOUTH DAILY.  . cyclobenzaprine (FLEXERIL) 10 MG tablet Take 1 tablet (10 mg total) by mouth 3 (three) times daily as needed for muscle spasms.  . famciclovir (FAMVIR) 500 MG tablet Take 1 tablet (500 mg total) by  mouth 3 (three) times daily.  Marland Kitchen gabapentin (NEURONTIN) 600 MG tablet Take 1 tablet (600 mg total) by mouth 3 (three) times daily. Reported on 05/26/2015  . Guaifenesin (MUCINEX MAXIMUM STRENGTH) 1200 MG TB12 Take 1 tablet (1,200 mg total) by mouth every 12 (twelve) hours as needed.  . levETIRAcetam (KEPPRA) 500 MG tablet TAKE 1 TABLET (500 MG TOTAL) BY MOUTH TWO (2) TIMES A DAY.  . metoprolol (LOPRESSOR) 50 MG tablet Take 1 tablet (50 mg total) by mouth 2 (two) times daily.  . ondansetron (ZOFRAN ODT) 4 MG disintegrating tablet Take 1 tablet (4 mg total) by mouth every 8 (eight) hours as needed for nausea or vomiting.  . pantoprazole (PROTONIX) 40 MG tablet Take 1 tablet (40 mg total) by mouth 2 (two) times daily. TAKE 1 TABLET (40 MG TOTAL) BY MOUTH DAILY.  Marland Kitchen QUEtiapine (SEROQUEL) 300 MG tablet Take 1 tablet (300 mg total) by mouth at bedtime.  . ranitidine (ZANTAC) 150 MG tablet Take 1 tablet (150 mg total) by mouth 2 (two) times daily.  Marland Kitchen tiZANidine (ZANAFLEX) 4 MG tablet Take 4 mg by mouth every 8 (eight) hours as needed for muscle spasms.    No Known Allergies  Social History   Social History  . Marital status: Married    Spouse name: Gwyndolyn Saxon  . Number of children: N/A  . Years of education: N/A   Occupational History  . CMA [employer]   Social History Main Topics  . Smoking status: Former Smoker    Packs/day: 0.50    Years: 40.00    Types: Cigarettes    Start date: 12/18/2015  . Smokeless tobacco: Never Used     Comment: 3-4 cigarettes daily  . Alcohol use No  . Drug use: No  . Sexual activity: Not Asked     Comment: lives with husband, no dietary restrictions.    Other Topics Concern  . None   Social History Narrative   Lives with her husband and their son.   Daughter lives in [place], [place].    family history includes Cancer in her brother, father, sister, and sister; Diabetes in her sister; Heart attack in her mother and sister; Heart disease in her mother;  Hypertension in her brother, father, and sister.  Wt Readings from Last 3 Encounters:  05/18/16 56.2 kg (124 lb)  04/20/16 52.6 kg (116 lb)  04/13/16 52.7 kg (116 lb 3.2 oz)    PHYSICAL EXAM BP (!) 142/90   Pulse 76   Ht 5\' 2"  (1.575 m)   Wt 56.2 kg (124 lb)   LMP  (LMP Unknown)   BMI 22.68 kg/m  General appearance: alert, cooperative, appears stated age, no distress and Relatively well-appearing. She has a very hoarse voice Neck: no adenopathy, no carotid bruit and no JVD Lungs: clear to auscultation bilaterally, normal percussion bilaterally and non-labored Heart: regular rate and rhythm, S1&  S2 normal, no murmur, click, rub or gallop; non-displaced  PMI Abdomen: soft, non-tender; bowel sounds normal; no masses,  no organomegaly;  Extremities: extremities normal, atraumatic, no cyanosis, or edema  Neurologic: Mental status: Alert, oriented, thought content appropriate MSK - point tenderness along the L costo-sternal margin - reproducible (what she feels)   Adult ECG Report  Rate: 76 ;  Rhythm: normal sinus rhythm; normal axis, intervals and durations  Narrative Interpretation: Normal/stable EKG   Other studies Reviewed: Additional studies/ records that were reviewed today include:  Recent Labs:  n/a     ASSESSMENT / PLAN: Problem List Items Addressed This Visit    Anterior chest wall pain (Chronic)    S phase reproducible exam. She had a negative Myoview and relatively normal echocardiogram. I think this is probably costochondritis. We talked about short course of high-dose ibuprofen. 600 mg 3 times a day for 4 days followed by 4 mm 3 times a day for 4 days and then 200 mg 3 times a day for 4 days.      Relevant Orders   EKG 12-Lead   Essential hypertension (Chronic)    Borderline today, but relatively stable on current meds --  she is on a stable dose of beta blocker.      Relevant Orders   EKG 12-Lead   Palpitations (Chronic)    Rare ectopy noted on monitor.  Stable on beta blocker.      Relevant Orders   EKG 12-Lead   SYNCOPE (Chronic)    Most likely consistent with either pseudoseizure or seizure. Not likely to be arrhythmogenic cardiac syncope      Relevant Orders   EKG 12-Lead      Current medicines are reviewed at length with the patient today. (+/- concerns) n/a The following changes have been made: n/a  Patient Instructions  No medication changes  Your physician wants you to follow-up in: 6 months with Dr. Ellyn Hack. You will receive a reminder letter in the mail two months in advance. If you don't receive a letter, please call our office to schedule the follow-up appointment.    Studies Ordered:   Orders Placed This Encounter  Procedures  . EKG 12-Lead      Glenetta Hew, M.D., M.S. Interventional Cardiologist   Pager # [phone] Phone # [phone] [address] [place], [place] [address]

## 2016-05-20 NOTE — Assessment & Plan Note (Signed)
Borderline today, but relatively stable on current meds --  she is on a stable dose of beta blocker.

## 2016-05-20 NOTE — Assessment & Plan Note (Signed)
Most likely consistent with either pseudoseizure or seizure. Not likely to be arrhythmogenic cardiac syncope

## 2016-05-20 NOTE — Assessment & Plan Note (Signed)
Rare ectopy noted on monitor. Stable on beta blocker.

## 2016-05-20 NOTE — Assessment & Plan Note (Signed)
S phase reproducible exam. She had a negative Myoview and relatively normal echocardiogram. I think this is probably costochondritis. We talked about short course of high-dose ibuprofen. 600 mg 3 times a day for 4 days followed by 4 mm 3 times a day for 4 days and then 200 mg 3 times a day for 4 days.

## 2016-05-21 ENCOUNTER — Ambulatory Visit: Payer: 59 | Admitting: Gastroenterology

## 2016-05-22 ENCOUNTER — Other Ambulatory Visit: Payer: Self-pay | Admitting: Family Medicine

## 2016-05-22 DIAGNOSIS — R05 Cough: Secondary | ICD-10-CM

## 2016-05-22 DIAGNOSIS — R059 Cough, unspecified: Secondary | ICD-10-CM

## 2016-05-23 NOTE — Telephone Encounter (Signed)
Would you like for patient to come in?  It looks like we gave a refill for the benzonatate one time before.  We can call to see what is going on or unless you know about patient.   Please advise?

## 2016-05-23 NOTE — Telephone Encounter (Signed)
She should probably come in for reevaluation if she is sick again.

## 2016-06-05 ENCOUNTER — Other Ambulatory Visit (INDEPENDENT_AMBULATORY_CARE_PROVIDER_SITE_OTHER): Payer: 59

## 2016-06-05 DIAGNOSIS — R739 Hyperglycemia, unspecified: Secondary | ICD-10-CM

## 2016-06-05 DIAGNOSIS — I10 Essential (primary) hypertension: Secondary | ICD-10-CM | POA: Diagnosis not present

## 2016-06-05 DIAGNOSIS — E782 Mixed hyperlipidemia: Secondary | ICD-10-CM | POA: Diagnosis not present

## 2016-06-06 LAB — COMPREHENSIVE METABOLIC PANEL
ALT: 12 U/L (ref 0–35)
AST: 33 U/L (ref 0–37)
Albumin: 4.2 g/dL (ref 3.5–5.2)
Alkaline Phosphatase: 54 U/L (ref 39–117)
BUN: 9 mg/dL (ref 6–23)
CO2: 22 mEq/L (ref 19–32)
Calcium: 9.6 mg/dL (ref 8.4–10.5)
Chloride: 111 mEq/L (ref 96–112)
Creatinine, Ser: 0.75 mg/dL (ref 0.40–1.20)
GFR: 102.18 mL/min (ref >60.00)
Glucose, Bld: 83 mg/dL (ref 70–99)
Potassium: 5.5 mEq/L — ABNORMAL HIGH (ref 3.5–5.1)
Sodium: 145 mEq/L (ref 135–145)
Total Bilirubin: 0.3 mg/dL (ref 0.2–1.2)
Total Protein: 7.3 g/dL (ref 6.0–8.3)

## 2016-06-06 LAB — LIPID PANEL
Cholesterol: 178 mg/dL (ref 0–200)
HDL: 58.8 mg/dL (ref >39.00)
LDL Cholesterol: 87 mg/dL (ref 0–99)
NonHDL: 119.07
Total CHOL/HDL Ratio: 3
Triglycerides: 162 mg/dL — ABNORMAL HIGH (ref 0.0–149.0)
VLDL: 32.4 mg/dL (ref 0.0–40.0)

## 2016-06-06 LAB — CBC
HCT: 37.7 % (ref 36.0–46.0)
Hemoglobin: 12.2 g/dL (ref 12.0–15.0)
MCHC: 32.3 g/dL (ref 30.0–36.0)
MCV: 87.3 fl (ref 78.0–100.0)
Platelets: 313 10*3/uL (ref 150.0–400.0)
RBC: 4.32 Mil/uL (ref 3.87–5.11)
RDW: 14.9 % (ref 11.5–15.5)
WBC: 11.1 10*3/uL — ABNORMAL HIGH (ref 4.0–10.5)

## 2016-06-06 LAB — TSH: TSH: 0.86 u[IU]/mL (ref 0.35–4.50)

## 2016-06-06 LAB — HEMOGLOBIN A1C: Hgb A1c MFr Bld: 5.8 % (ref 4.6–6.5)

## 2016-06-29 ENCOUNTER — Ambulatory Visit: Payer: 59 | Attending: Otolaryngology | Admitting: *Deleted

## 2016-07-02 ENCOUNTER — Other Ambulatory Visit (HOSPITAL_COMMUNITY): Payer: Self-pay | Admitting: Gastroenterology

## 2016-07-02 ENCOUNTER — Encounter: Payer: Self-pay | Admitting: Gastroenterology

## 2016-07-02 ENCOUNTER — Ambulatory Visit (INDEPENDENT_AMBULATORY_CARE_PROVIDER_SITE_OTHER): Payer: 59 | Admitting: Gastroenterology

## 2016-07-02 VITALS — BP 140/86 | HR 88 | Ht 62.0 in | Wt 121.0 lb

## 2016-07-02 DIAGNOSIS — R05 Cough: Secondary | ICD-10-CM | POA: Diagnosis not present

## 2016-07-02 DIAGNOSIS — R1310 Dysphagia, unspecified: Secondary | ICD-10-CM

## 2016-07-02 DIAGNOSIS — R0989 Other specified symptoms and signs involving the circulatory and respiratory systems: Secondary | ICD-10-CM

## 2016-07-02 DIAGNOSIS — K219 Gastro-esophageal reflux disease without esophagitis: Secondary | ICD-10-CM | POA: Diagnosis not present

## 2016-07-02 DIAGNOSIS — R053 Chronic cough: Secondary | ICD-10-CM

## 2016-07-02 NOTE — Progress Notes (Signed)
History of Present Illness: This is a 56 year old female with with vocal difficulties and swallowing difficulties. She relates for the past 5 months she has had severe hoarseness and vocal fatigue. She was evaluated by Dr. Erik Obey in February. She was evaluated by Dr. Ellyn Hack in March for anterior chest pain which he felt was chest wall pain, possibly costochondritis. She relates problems swallowing liquids and solids with frequent choking and coughing with liquids and difficulty swallowing solids. She has been alternating pantoprazole and ranitidine for control of reflux symptoms and she feels both worked well to control reflux and she cannot distinguish the difference. Her vocal difficulties and dysphagia have not improved with optimizing reflux therapy. Denies weight loss, abdominal pain, constipation, diarrhea, change in stool caliber, melena, hematochezia, nausea, vomiting, chest pain.  EGD 2005 esophageal erythema, erosive antral gastritis.   No Known Allergies Outpatient Medications Prior to Visit  Medication Sig Dispense Refill  . ALPRAZolam (XANAX) 0.5 MG tablet Take 1 tablet (0.5 mg total) by mouth 2 (two) times daily as needed for anxiety. 60 tablet 3  . atorvastatin (LIPITOR) 10 MG tablet Take 1 tablet (10 mg total) by mouth daily. 30 tablet 3  . Azelastine HCl 0.15 % SOLN Place 2 sprays into both nostrils 2 (two) times daily. 30 mL 0  . baclofen (LIORESAL) 10 MG tablet TAKE 1/2 TABLET BY MOUTH 3 TIMES A DAY  4  . benzonatate (TESSALON) 100 MG capsule TAKE 1-2 CAPSULES BY MOUTH  3 TIMES DAILY AS NEEDED FOR COUGH. 40 capsule 0  . cefdinir (OMNICEF) 300 MG capsule Take 300 mg by mouth 2 (two) times daily.    . citalopram (CELEXA) 20 MG tablet TAKE 1 TABLET (20 MG TOTAL) BY MOUTH DAILY. 90 tablet 2  . cyclobenzaprine (FLEXERIL) 10 MG tablet Take 1 tablet (10 mg total) by mouth 3 (three) times daily as needed for muscle spasms. 90 tablet 1  . famciclovir (FAMVIR) 500 MG tablet Take 1  tablet (500 mg total) by mouth 3 (three) times daily. 21 tablet 0  . gabapentin (NEURONTIN) 600 MG tablet Take 1 tablet (600 mg total) by mouth 3 (three) times daily. Reported on 05/26/2015 90 tablet 1  . Guaifenesin (MUCINEX MAXIMUM STRENGTH) 1200 MG TB12 Take 1 tablet (1,200 mg total) by mouth every 12 (twelve) hours as needed. 14 tablet 1  . levETIRAcetam (KEPPRA) 500 MG tablet TAKE 1 TABLET (500 MG TOTAL) BY MOUTH TWO (2) TIMES A DAY.  3  . metoprolol (LOPRESSOR) 50 MG tablet Take 1 tablet (50 mg total) by mouth 2 (two) times daily. 60 tablet 3  . ondansetron (ZOFRAN ODT) 4 MG disintegrating tablet Take 1 tablet (4 mg total) by mouth every 8 (eight) hours as needed for nausea or vomiting. 20 tablet 0  . pantoprazole (PROTONIX) 40 MG tablet Take 1 tablet (40 mg total) by mouth 2 (two) times daily. TAKE 1 TABLET (40 MG TOTAL) BY MOUTH DAILY. 180 tablet 2  . QUEtiapine (SEROQUEL) 300 MG tablet Take 1 tablet (300 mg total) by mouth at bedtime. 90 tablet 1  . ranitidine (ZANTAC) 150 MG tablet Take 1 tablet (150 mg total) by mouth 2 (two) times daily. 180 tablet 2  . tiZANidine (ZANAFLEX) 4 MG tablet Take 4 mg by mouth every 8 (eight) hours as needed for muscle spasms.     No facility-administered medications prior to visit.    Past Medical History:  Diagnosis Date  . Anemia   . Anxiety   .  Arm skin lesion, left 09/05/2014  . Constipation 12/27/2013  . Depression   . Dysphagia 04/15/2016  . Eczema 08/22/2015  . GERD (gastroesophageal reflux disease)   . Hematuria 03/04/2016  . History of shingles 01/22/2016  . Hyperglycemia 01/15/2015  . Hyperlipidemia, mixed 08/22/2015  . Hypertension   . Menometrorrhagia 2006  . Migraine, unspecified, without mention of intractable migraine without mention of status migrainosus   . Pseudoseizures   . Seizures ([place])    history of pseudoseizure disorder, last last seizure 3 wks, keppra inc in dosage  . Tubular adenoma of colon 05/2011  . Uterine leiomyoma 2006     Past Surgical History:  Procedure Laterality Date  . ABDOMINAL HYSTERECTOMY  04/06/04  . APPENDECTOMY    . BILATERAL SALPINGOOPHORECTOMY  04/06/04  . MASS EXCISION Left 06/02/2015   Procedure: EXCISION LEFT ARM MASS;  Surgeon: Autumn Messing III, MD;  Location: [place];  Service: General;  Laterality: Left;  . ROTATOR CUFF REPAIR     Social History   Social History  . Marital status: Married    Spouse name: Gwyndolyn Saxon  . Number of children: N/A  . Years of education: N/A   Occupational History  . CMA [employer]   Social History Main Topics  . Smoking status: Former Smoker    Packs/day: 0.50    Years: 40.00    Types: Cigarettes    Start date: 12/18/2015  . Smokeless tobacco: Never Used     Comment: 3-4 cigarettes daily  . Alcohol use No  . Drug use: No  . Sexual activity: Not Asked     Comment: lives with husband, no dietary restrictions.    Other Topics Concern  . None   Social History Narrative   Lives with her husband and their son.   Daughter lives in [place], [place].   Family History  Problem Relation Age of Onset  . Heart disease Mother   . Heart attack Mother   . Cancer Father     colon  . Hypertension Father   . Cancer Sister     brain  . Cancer Brother   . Hypertension Sister   . Cancer Sister     unsure of type  . Diabetes Sister     type 2  . Heart attack Sister   . Hypertension Brother   . Colon cancer Neg Hx        Physical Exam: General: Well developed, well nourished, no acute distress Head: Normocephalic and atraumatic Eyes:  sclerae anicteric, EOMI Ears: Normal auditory acuity Mouth: No deformity or lesions Lungs: Clear throughout to auscultation Heart: Regular rate and rhythm; no murmurs, rubs or bruits Abdomen: Soft, non tender and non distended. No masses, hepatosplenomegaly or hernias noted. Normal Bowel sounds Musculoskeletal: Symmetrical with no gross deformities  Pulses:  Normal pulses noted Extremities:  No clubbing, cyanosis, edema or deformities noted Neurological: Alert oriented x 4, grossly nonfocal Psychological:  Alert and cooperative. Normal mood and affect  Assessment and Recommendations:  1. Dysphagia. Unclear if this is oropharyngeal, esophageal or both. Schedule a modified barium swallow study and barium esophagram with tablet. Pending barium esophagram findings, EGD may be indicated for further evaluation.  2. Dysphonia. Follow up with Dr. Erik Obey and speech therapy.   3. GERD. Maintain pantoprazole 40 mg twice daily and standard antireflux measures.  4. Chest wall pain, possible costochondritis. Follow-up with Dr. Charlett Blake.

## 2016-07-02 NOTE — Patient Instructions (Signed)
You have been scheduled for a modified barium swallow/barium swallow on 07/05/16 at 11:30am. Please arrive 15 minutes prior to your test for registration. You will go to the main entrance at [facility] (1st Floor) for your appointment. Please refrain from eating or drinking anything 4 hours prior to your test. Should you need to cancel or reschedule your appointment, please contact [phone] [facility]) or [phone] [facility]). _____________________________________________________________________ A Modified Barium Swallow Study, or MBS, is a special x-ray that is taken to check swallowing skills. It is carried out by a Stage manager and a Psychologist, clinical (SLP). During this test, yourmouth, throat, and esophagus, a muscular tube which connects your mouth to your stomach, is checked. The test will help you, your doctor, and the SLP plan what types of foods and liquids are easier for you to swallow. The SLP will also identify positions and ways to help you swallow more easily and safely. What will happen during an MBS? You will be taken to an x-ray room and seated comfortably. You will be asked to swallow small amounts of food and liquid mixed with barium. Barium is a liquid or paste that allows images of your mouth, throat and esophagus to be seen on x-ray. The x-ray captures moving images of the food you are swallowing as it travels from your mouth through your throat and into your esophagus. This test helps identify whether food or liquid is entering your lungs (aspiration). The test also shows which part of your mouth or throat lacks strength or coordination to move the food or liquid in the right direction. This test typically takes 30 minutes to 1 hour to complete. _______________________________________________________________________ Thank you for choosing me and [facility].  Pricilla Riffle. Dagoberto Ligas., MD., Marval Regal

## 2016-07-05 ENCOUNTER — Ambulatory Visit (HOSPITAL_COMMUNITY)
Admission: RE | Admit: 2016-07-05 | Discharge: 2016-07-05 | Disposition: A | Discharge status: Home / Self Care | Payer: 59 | Source: Ambulatory Visit | Attending: Gastroenterology | Admitting: Gastroenterology

## 2016-07-05 DIAGNOSIS — F419 Anxiety disorder, unspecified: Secondary | ICD-10-CM | POA: Insufficient documentation

## 2016-07-05 DIAGNOSIS — R05 Cough: Secondary | ICD-10-CM

## 2016-07-05 DIAGNOSIS — K219 Gastro-esophageal reflux disease without esophagitis: Secondary | ICD-10-CM | POA: Diagnosis not present

## 2016-07-05 DIAGNOSIS — R0989 Other specified symptoms and signs involving the circulatory and respiratory systems: Secondary | ICD-10-CM | POA: Insufficient documentation

## 2016-07-05 DIAGNOSIS — I10 Essential (primary) hypertension: Secondary | ICD-10-CM | POA: Diagnosis not present

## 2016-07-05 DIAGNOSIS — E782 Mixed hyperlipidemia: Secondary | ICD-10-CM | POA: Diagnosis not present

## 2016-07-05 DIAGNOSIS — R1310 Dysphagia, unspecified: Secondary | ICD-10-CM | POA: Insufficient documentation

## 2016-07-05 DIAGNOSIS — Z8619 Personal history of other infectious and parasitic diseases: Secondary | ICD-10-CM | POA: Insufficient documentation

## 2016-07-05 DIAGNOSIS — F329 Major depressive disorder, single episode, unspecified: Secondary | ICD-10-CM | POA: Insufficient documentation

## 2016-07-05 DIAGNOSIS — D649 Anemia, unspecified: Secondary | ICD-10-CM | POA: Insufficient documentation

## 2016-07-05 DIAGNOSIS — R053 Chronic cough: Secondary | ICD-10-CM

## 2016-07-06 NOTE — Progress Notes (Signed)
Late entry  07/06/16 0800  SLP Visit Information  SLP Received On 07/05/16  General Information  HPI Pt is a 56 year old female referred by GI, she rerpots a 5 month history of hoarse vocal quality, coughing with solids and liquids, expctoration of brown mucous. She mostly eats pureed foods. She has been seen by ENT, notes not available but pt reports only redness in pharynx noted. She has been taking reflux medication without relief.   Type of Study MBS-Modified Barium Swallow Study  Diet Prior to this Study Thin liquids;Dysphagia 1 (puree)  Temperature Spikes Noted N/A  Respiratory Status Room air  History of Recent Intubation No  Behavior/Cognition Alert;Cooperative;Pleasant mood  Oral Cavity Assessment WFL  Oral Care Completed by SLP No  Oral Cavity - Dentition Adequate natural dentition  Vision Functional for self feeding  Self-Feeding Abilities Able to feed self  Patient Positioning Upright in chair  Baseline Vocal Quality Hoarse  Volitional Cough Strong  Volitional Swallow Able to elicit  Anatomy [facility]  Pharyngeal Secretions Not observed secondary MBS  Oral Motor/Sensory Function  Overall Oral Motor/Sensory Function WFL  Oral Preparation/Oral Phase  Oral Phase WFL  Pharyngeal Phase  Pharyngeal Phase WFL  Cervical Esophageal Phase  Cervical Esophageal Phase [facility]  Clinical Impression  Clinical Impression Pt demonstrates no abnormalities associated with motor movement. Despite no penetration or aspiration, no residuals or backflow noted, pt consistently gags and coughs during pharyngeal phase of swallow. This appears to be a sensory issue, perhaps related to the vagal nerve though this is speculation. No helpful recommendations other than f/u for medical management and continuation of current diet as tolerated.   Swallow Evaluation Recommendations  Recommended Consults Consider GI evaluation;Consider ENT evaluation  SLP Diet Recommendations Thin liquid;Other (Comment) (solids per  pt preference)  Liquid Administration via Cup;Straw  Individuals Consulted  Consulted and Agree with Results and Recommendations Patient  SLP Time Calculation  SLP Start Time (ACUTE ONLY) 1150  SLP Stop Time (ACUTE ONLY) 1208  SLP Time Calculation (min) (ACUTE ONLY) 18 min  SLP G-Codes **NOT FOR INPATIENT CLASS**  Functional Assessment Tool Used clinical judgement  Functional Limitations Swallowing  Swallow Current Status (E4540) CI  Swallow Goal Status (J8119) CI  Swallow Discharge Status (J4782) CI

## 2016-07-15 ENCOUNTER — Other Ambulatory Visit: Payer: Self-pay | Admitting: Family Medicine

## 2016-08-22 ENCOUNTER — Other Ambulatory Visit: Payer: Self-pay | Admitting: Family Medicine

## 2016-09-06 ENCOUNTER — Other Ambulatory Visit: Payer: Self-pay | Admitting: Family Medicine

## 2016-09-06 MED ORDER — ATORVASTATIN CALCIUM 10 MG PO TABS: 10.0000 mg | ORAL_TABLET | Freq: Every day | ORAL | 1 refills | Status: DC

## 2016-10-02 ENCOUNTER — Other Ambulatory Visit (INDEPENDENT_AMBULATORY_CARE_PROVIDER_SITE_OTHER): Payer: 59

## 2016-10-02 ENCOUNTER — Other Ambulatory Visit: Payer: 59

## 2016-10-02 DIAGNOSIS — R739 Hyperglycemia, unspecified: Secondary | ICD-10-CM

## 2016-10-02 DIAGNOSIS — I10 Essential (primary) hypertension: Secondary | ICD-10-CM

## 2016-10-03 LAB — CBC
HCT: 41.7 % (ref 36.0–46.0)
Hemoglobin: 13.3 g/dL (ref 12.0–15.0)
MCHC: 31.9 g/dL (ref 30.0–36.0)
MCV: 85.8 fl (ref 78.0–100.0)
Platelets: 340 10*3/uL (ref 150.0–400.0)
RBC: 4.86 Mil/uL (ref 3.87–5.11)
RDW: 15.6 % — ABNORMAL HIGH (ref 11.5–15.5)
WBC: 8 10*3/uL (ref 4.0–10.5)

## 2016-10-03 LAB — COMPREHENSIVE METABOLIC PANEL
ALT: 17 U/L (ref 0–35)
AST: 23 U/L (ref 0–37)
Albumin: 4.5 g/dL (ref 3.5–5.2)
Alkaline Phosphatase: 61 U/L (ref 39–117)
BUN: 8 mg/dL (ref 6–23)
CO2: 22 mEq/L (ref 19–32)
Calcium: 9.8 mg/dL (ref 8.4–10.5)
Chloride: 105 mEq/L (ref 96–112)
Creatinine, Ser: 0.76 mg/dL (ref 0.40–1.20)
GFR: 100.51 mL/min (ref >60.00)
Glucose, Bld: 94 mg/dL (ref 70–99)
Potassium: 3.6 mEq/L (ref 3.5–5.1)
Sodium: 141 mEq/L (ref 135–145)
Total Bilirubin: 0.7 mg/dL (ref 0.2–1.2)
Total Protein: 7.7 g/dL (ref 6.0–8.3)

## 2016-10-03 LAB — HEMOGLOBIN A1C: Hgb A1c MFr Bld: 6.2 % (ref 4.6–6.5)

## 2016-10-03 LAB — TSH: TSH: 0.63 u[IU]/mL (ref 0.35–4.50)

## 2016-10-06 ENCOUNTER — Other Ambulatory Visit: Payer: Self-pay | Admitting: Family Medicine

## 2016-10-09 ENCOUNTER — Encounter: Payer: 59 | Admitting: Family Medicine

## 2016-10-09 NOTE — Telephone Encounter (Signed)
error 

## 2016-10-30 ENCOUNTER — Ambulatory Visit (INDEPENDENT_AMBULATORY_CARE_PROVIDER_SITE_OTHER): Payer: 59 | Admitting: Family Medicine

## 2016-10-30 ENCOUNTER — Encounter: Payer: Self-pay | Admitting: Family Medicine

## 2016-10-30 VITALS — BP 132/80 | HR 57 | Temp 98.0°F | Resp 18 | Ht 62.0 in | Wt 123.0 lb

## 2016-10-30 DIAGNOSIS — F419 Anxiety disorder, unspecified: Secondary | ICD-10-CM

## 2016-10-30 DIAGNOSIS — R1032 Left lower quadrant pain: Secondary | ICD-10-CM

## 2016-10-30 DIAGNOSIS — R1031 Right lower quadrant pain: Secondary | ICD-10-CM

## 2016-10-30 DIAGNOSIS — Z0000 Encounter for general adult medical examination without abnormal findings: Secondary | ICD-10-CM | POA: Diagnosis not present

## 2016-10-30 DIAGNOSIS — I10 Essential (primary) hypertension: Secondary | ICD-10-CM

## 2016-10-30 DIAGNOSIS — E782 Mixed hyperlipidemia: Secondary | ICD-10-CM

## 2016-10-30 DIAGNOSIS — R739 Hyperglycemia, unspecified: Secondary | ICD-10-CM

## 2016-10-30 DIAGNOSIS — F32A Depression, unspecified: Secondary | ICD-10-CM

## 2016-10-30 DIAGNOSIS — F329 Major depressive disorder, single episode, unspecified: Secondary | ICD-10-CM

## 2016-10-30 DIAGNOSIS — R1030 Lower abdominal pain, unspecified: Secondary | ICD-10-CM

## 2016-10-30 MED ORDER — QUETIAPINE FUMARATE 300 MG PO TABS: 300.0000 mg | ORAL_TABLET | Freq: Every day | ORAL | 1 refills | Status: DC

## 2016-10-30 NOTE — Progress Notes (Signed)
Subjective:  I acted as a Education administrator for Dr. Charlett Blake. [name], [place]   Patient ID: Ruth Bell, female    DOB: May 04, 1960, 56 y.o.   MRN: [id number]  No chief complaint on file.   HPI  Patient is in today for an annual exam. Patient c/o blood in her stool the pas few time she has used the bathroom. No recent febrile illness or acute hospitalizations. Denies CP/palp/SOB/HA/congestion/fevers/GI or GU c/o. Taking meds as prescribed  She feels well. She is pleased with the Seroquel and she reports she is handling stress beter. She is noting some ongoing trouble with intermittent LLQ and RLQ pain. No change in bowel habits and the pain is intermittent. No bloody or tarry stool.    Patient Care Team: Mosie Lukes, MD as PCP - General (Family Medicine)   Past Medical History:  Diagnosis Date  . Anemia   . Anxiety   . Arm skin lesion, left 09/05/2014  . Constipation 12/27/2013  . Depression   . Dysphagia 04/15/2016  . Eczema 08/22/2015  . GERD (gastroesophageal reflux disease)   . Hematuria 03/04/2016  . History of shingles 01/22/2016  . Hyperglycemia 01/15/2015  . Hyperlipidemia, mixed 08/22/2015  . Hypertension   . Menometrorrhagia 2006  . Migraine, unspecified, without mention of intractable migraine without mention of status migrainosus   . Preventative health care 11/04/2016  . Pseudoseizures   . Seizures ([place])    history of pseudoseizure disorder, last last seizure 3 wks, keppra inc in dosage  . Tubular adenoma of colon 05/2011  . Uterine leiomyoma 2006    Past Surgical History:  Procedure Laterality Date  . ABDOMINAL HYSTERECTOMY  04/06/04  . APPENDECTOMY    . BILATERAL SALPINGOOPHORECTOMY  04/06/04  . MASS EXCISION Left 06/02/2015   Procedure: EXCISION LEFT ARM MASS;  Surgeon: Autumn Messing III, MD;  Location: [facility];  Service: General;  Laterality: Left;  . ROTATOR CUFF REPAIR      Family History  Problem Relation Age of Onset  . Heart disease Mother   . Heart  attack Mother   . Cancer Father        colon  . Hypertension Father   . Cancer Sister        brain  . Cancer Brother   . Hypertension Sister   . Cancer Sister        unsure of type  . Diabetes Sister        type 2  . Heart attack Sister   . Hypertension Brother   . Colon cancer Neg Hx     Social History   Social History  . Marital status: Married    Spouse name: Gwyndolyn Saxon  . Number of children: N/A  . Years of education: N/A   Occupational History  . CMA [employer]   Social History Main Topics  . Smoking status: Former Smoker    Packs/day: 0.50    Years: 40.00    Types: Cigarettes    Start date: 12/18/2015  . Smokeless tobacco: Never Used     Comment: 3-4 cigarettes daily  . Alcohol use No  . Drug use: No  . Sexual activity: Not on file     Comment: lives with husband, no dietary restrictions.    Other Topics Concern  . Not on file   Social History Narrative   Lives with her husband and their son.   Daughter lives in [place], [place].    Outpatient Medications  Prior to Visit  Medication Sig Dispense Refill  . ALPRAZolam (XANAX) 0.5 MG tablet Take 1 tablet (0.5 mg total) by mouth 2 (two) times daily as needed for anxiety. 60 tablet 3  . atorvastatin (LIPITOR) 10 MG tablet Take 1 tablet (10 mg total) by mouth daily. 90 tablet 1  . Azelastine HCl 0.15 % SOLN Place 2 sprays into both nostrils 2 (two) times daily. 30 mL 0  . baclofen (LIORESAL) 10 MG tablet TAKE 1/2 TABLET BY MOUTH 3 TIMES A DAY  4  . benzonatate (TESSALON) 100 MG capsule TAKE 1-2 CAPSULES BY MOUTH  3 TIMES DAILY AS NEEDED FOR COUGH. 40 capsule 0  . citalopram (CELEXA) 20 MG tablet TAKE 1 TABLET BY MOUTH  DAILY 90 tablet 0  . cyclobenzaprine (FLEXERIL) 10 MG tablet Take 1 tablet (10 mg total) by mouth 3 (three) times daily as needed for muscle spasms. 90 tablet 1  . famciclovir (FAMVIR) 500 MG tablet Take 1 tablet (500 mg total) by mouth 3 (three) times daily. 21 tablet 0  . gabapentin  (NEURONTIN) 600 MG tablet Take 1 tablet (600 mg total) by mouth 3 (three) times daily. Reported on 05/26/2015 90 tablet 1  . Guaifenesin (MUCINEX MAXIMUM STRENGTH) 1200 MG TB12 Take 1 tablet (1,200 mg total) by mouth every 12 (twelve) hours as needed. 14 tablet 1  . levETIRAcetam (KEPPRA) 500 MG tablet TAKE 1 TABLET (500 MG TOTAL) BY MOUTH TWO (2) TIMES A DAY.  3  . metoprolol (LOPRESSOR) 50 MG tablet Take 1 tablet (50 mg total) by mouth 2 (two) times daily. 60 tablet 3  . ondansetron (ZOFRAN ODT) 4 MG disintegrating tablet Take 1 tablet (4 mg total) by mouth every 8 (eight) hours as needed for nausea or vomiting. 20 tablet 0  . pantoprazole (PROTONIX) 40 MG tablet Take 1 tablet (40 mg total) by mouth 2 (two) times daily. TAKE 1 TABLET (40 MG TOTAL) BY MOUTH DAILY. 180 tablet 2  . ranitidine (ZANTAC) 150 MG tablet Take 1 tablet (150 mg total) by mouth 2 (two) times daily. 180 tablet 2  . tiZANidine (ZANAFLEX) 4 MG tablet Take 4 mg by mouth every 8 (eight) hours as needed for muscle spasms.    . cefdinir (OMNICEF) 300 MG capsule Take 300 mg by mouth 2 (two) times daily.    . QUEtiapine (SEROQUEL) 300 MG tablet TAKE 1 TABLET (300 MG TOTAL) BY MOUTH AT BEDTIME. 90 tablet 1   No facility-administered medications prior to visit.     No Known Allergies  Review of Systems  Constitutional: Negative for fever and malaise/fatigue.  HENT: Negative for congestion.   Eyes: Negative for blurred vision.  Respiratory: Negative for cough and shortness of breath.   Cardiovascular: Negative for chest pain, palpitations and leg swelling.  Gastrointestinal: Negative for vomiting.  Musculoskeletal: Negative for back pain.  Skin: Negative for rash.  Neurological: Negative for loss of consciousness and headaches.       Objective:    Physical Exam  Constitutional: She is oriented to person, place, and time. She appears well-developed and well-nourished. No distress.  HENT:  Head: Normocephalic and atraumatic.   Eyes: Conjunctivae are normal.  Neck: Normal range of motion. No thyromegaly present.  Cardiovascular: Normal rate and regular rhythm.   Pulmonary/Chest: Effort normal and breath sounds normal. She has no wheezes.  Abdominal: Soft. Bowel sounds are normal. There is no tenderness.  Musculoskeletal: Normal range of motion. She exhibits no edema or deformity.  Neurological: She  is alert and oriented to person, place, and time.  Skin: Skin is warm and dry. She is not diaphoretic.  Psychiatric: She has a normal mood and affect.    BP 132/80   Pulse (!) 57   Temp 98 F (36.7 C) (Oral)   Resp 18   Ht 5\' 2"  (1.575 m)   Wt 123 lb (55.8 kg)   LMP  (LMP Unknown)   SpO2 98%   BMI 22.50 kg/m  Wt Readings from Last 3 Encounters:  10/30/16 123 lb (55.8 kg)  07/02/16 121 lb (54.9 kg)  05/18/16 124 lb (56.2 kg)   BP Readings from Last 3 Encounters:  11/04/16 132/80  07/02/16 140/86  05/18/16 (!) 142/90     Immunization History  Administered Date(s) Administered  . Influenza Split 03/06/2011, 02/29/2012  . Pneumococcal Polysaccharide-23 01/09/2008, 02/23/2012  . Td 04/28/2007    Health Maintenance  Topic Date Due  . INFLUENZA VACCINE  04/15/2017 (Originally 10/17/2016)  . TETANUS/TDAP  04/27/2017  . MAMMOGRAM  05/29/2017  . COLONOSCOPY  07/26/2020  . Hepatitis C Screening  Completed  . HIV Screening  Completed    Lab Results  Component Value Date   WBC 8.0 10/02/2016   HGB 13.3 10/02/2016   HCT 41.7 10/02/2016   PLT 340.0 10/02/2016   GLUCOSE 94 10/02/2016   CHOL 178 06/05/2016   TRIG 162.0 (H) 06/05/2016   HDL 58.80 06/05/2016   LDLCALC 87 06/05/2016   ALT 17 10/02/2016   AST 23 10/02/2016   NA 141 10/02/2016   K 3.6 10/02/2016   CL 105 10/02/2016   CREATININE 0.76 10/02/2016   BUN 8 10/02/2016   CO2 22 10/02/2016   TSH 0.63 10/02/2016   INR 1.0 11/19/2013   HGBA1C 6.2 10/02/2016    Lab Results  Component Value Date   TSH 0.63 10/02/2016   Lab Results    Component Value Date   WBC 8.0 10/02/2016   HGB 13.3 10/02/2016   HCT 41.7 10/02/2016   MCV 85.8 10/02/2016   PLT 340.0 10/02/2016   Lab Results  Component Value Date   NA 141 10/02/2016   K 3.6 10/02/2016   CO2 22 10/02/2016   GLUCOSE 94 10/02/2016   BUN 8 10/02/2016   CREATININE 0.76 10/02/2016   BILITOT 0.7 10/02/2016   ALKPHOS 61 10/02/2016   AST 23 10/02/2016   ALT 17 10/02/2016   PROT 7.7 10/02/2016   ALBUMIN 4.5 10/02/2016   CALCIUM 9.8 10/02/2016   ANIONGAP 7 01/10/2016   GFR 100.51 10/02/2016   Lab Results  Component Value Date   CHOL 178 06/05/2016   Lab Results  Component Value Date   HDL 58.80 06/05/2016   Lab Results  Component Value Date   LDLCALC 87 06/05/2016   Lab Results  Component Value Date   TRIG 162.0 (H) 06/05/2016   Lab Results  Component Value Date   CHOLHDL 3 06/05/2016   Lab Results  Component Value Date   HGBA1C 6.2 10/02/2016         Assessment & Plan:   Problem List Items Addressed This Visit    Essential hypertension (Chronic)    Well controlled, no changes to meds. Encouraged heart healthy diet such as the DASH diet and exercise as tolerated.       Anxiety and depression    Doing much better on Seroquel she reports she feels well      Abdominal pain    B/l lower quadrant pain, no obvious  pattern but happens several times a week. No change in bowel or bladder habits. Proceed with imaging and encouraged to use fiber supplements and probiotics.       Hyperglycemia    hgba1c acceptable, minimize simple carbs. Increase exercise as tolerated.       Hyperlipidemia, mixed    Tolerating statin, encouraged heart healthy diet, avoid trans fats, minimize simple carbs and saturated fats. Increase exercise as tolerated      Preventative health care    Patient encouraged to maintain heart healthy diet, regular exercise, adequate sleep. Consider daily probiotics. Take medications as prescribed       Other Visit  Diagnoses    Abdominal pain, acute, bilateral lower quadrant    -  Primary   Relevant Orders   US Pelvis Complete (Completed)      I have discontinued Ms. Schorsch's cefdinir. I am also having her maintain her cyclobenzaprine, levETIRAcetam, Azelastine HCl, Guaifenesin, gabapentin, metoprolol tartrate, ondansetron, ALPRAZolam, famciclovir, benzonatate, ranitidine, pantoprazole, baclofen, tiZANidine, citalopram, atorvastatin, and QUEtiapine.  Meds ordered this encounter  Medications  . QUEtiapine (SEROQUEL) 300 MG tablet    Sig: Take 1 tablet (300 mg total) by mouth at bedtime.    Dispense:  90 tablet    Refill:  1    CMA served as Education administrator during this visit. History, Physical and Plan performed by medical provider. Documentation and orders reviewed and attested to.  [name], MD

## 2016-10-30 NOTE — Patient Instructions (Addendum)
Encouraged good sleep hygiene such as dark, quiet room. No blue/green glowing lights such as computer screens in bedroom. No alcohol or stimulants in evening. Cut down on caffeine as able. Regular exercise is helpful but not just prior to bed time.   Melatonin 2-10 mg 1 hour before bed for insomnia or L tryptophan caps to sleep warm milk with vanilla and honey with a cookie  shingrix is the new shingles shot, 2 shots over 6 months can get here or at [place] [facility], Female Preventive care refers to lifestyle choices and visits with your health care provider that can promote health and wellness. What does preventive care include?  A yearly physical exam. This is also called an annual well check.  Dental exams once or twice a year.  Routine eye exams. Ask your health care provider how often you should have your eyes checked.  Personal lifestyle choices, including: ? Daily care of your teeth and gums. ? Regular physical activity. ? Eating a healthy diet. ? Avoiding tobacco and drug use. ? Limiting alcohol use. ? Practicing safe sex. ? Taking low-dose aspirin daily starting at age 58. ? Taking vitamin and mineral supplements as recommended by your health care provider. What happens during an annual well check? The services and screenings done by your health care provider during your annual well check will depend on your age, overall health, lifestyle risk factors, and family history of disease. Counseling Your health care provider may ask you questions about your:  Alcohol use.  Tobacco use.  Drug use.  Emotional well-being.  Home and relationship well-being.  Sexual activity.  Eating habits.  Work and work Statistician.  Method of birth control.  Menstrual cycle.  Pregnancy history.  Screening You may have the following tests or measurements:  Height, weight, and BMI.  Blood pressure.  Lipid and cholesterol levels. These may be checked every  5 years, or more frequently if you are over 58 years old.  Skin check.  Lung cancer screening. You may have this screening every year starting at age 58 if you have a 30-pack-year history of smoking and currently smoke or have quit within the past 15 years.  Fecal occult blood test (FOBT) of the stool. You may have this test every year starting at age 58.  Flexible sigmoidoscopy or colonoscopy. You may have a sigmoidoscopy every 5 years or a colonoscopy every 10 years starting at age 58.  Hepatitis C blood test.  Hepatitis B blood test.  Sexually transmitted disease (STD) testing.  Diabetes screening. This is done by checking your blood sugar (glucose) after you have not eaten for a while (fasting). You may have this done every 1-3 years.  Mammogram. This may be done every 1-2 years. Talk to your health care provider about when you should start having regular mammograms. This may depend on whether you have a family history of breast cancer.  BRCA-related cancer screening. This may be done if you have a family history of breast, ovarian, tubal, or peritoneal cancers.  Pelvic exam and Pap test. This may be done every 3 years starting at age 58. Starting at age 35, this may be done every 5 years if you have a Pap test in combination with an HPV test.  Bone density scan. This is done to screen for osteoporosis. You may have this scan if you are at high risk for osteoporosis.  Discuss your test results, treatment options, and if necessary, the need for more tests  with your health care provider. Vaccines Your health care provider may recommend certain vaccines, such as:  Influenza vaccine. This is recommended every year.  Tetanus, diphtheria, and acellular pertussis (Tdap, Td) vaccine. You may need a Td booster every 10 years.  Varicella vaccine. You may need this if you have not been vaccinated.  Zoster vaccine. You may need this after age 58.  Measles, mumps, and rubella (MMR)  vaccine. You may need at least one dose of MMR if you were born in 1957 or later. You may also need a second dose.  Pneumococcal 13-valent conjugate (PCV13) vaccine. You may need this if you have certain conditions and were not previously vaccinated.  Pneumococcal polysaccharide (PPSV23) vaccine. You may need one or two doses if you smoke cigarettes or if you have certain conditions.  Meningococcal vaccine. You may need this if you have certain conditions.  Hepatitis A vaccine. You may need this if you have certain conditions or if you travel or work in places where you may be exposed to hepatitis A.  Hepatitis B vaccine. You may need this if you have certain conditions or if you travel or work in places where you may be exposed to hepatitis B.  Haemophilus influenzae type b (Hib) vaccine. You may need this if you have certain conditions.  Talk to your health care provider about which screenings and vaccines you need and how often you need them. This information is not intended to replace advice given to you by your health care provider. Make sure you discuss any questions you have with your health care provider. Document Released: 04/01/2015 Document Revised: 11/23/2015 Document Reviewed: 01/04/2015 Elsevier Interactive Patient Education  2017 [employer].

## 2016-11-01 ENCOUNTER — Other Ambulatory Visit: Payer: Self-pay | Admitting: Family Medicine

## 2016-11-01 DIAGNOSIS — R102 Pelvic and perineal pain: Secondary | ICD-10-CM

## 2016-11-03 ENCOUNTER — Ambulatory Visit (HOSPITAL_BASED_OUTPATIENT_CLINIC_OR_DEPARTMENT_OTHER)
Admission: RE | Admit: 2016-11-03 | Discharge: 2016-11-03 | Disposition: A | Discharge status: Home / Self Care | Payer: 59 | Source: Ambulatory Visit | Attending: Family Medicine | Admitting: Family Medicine

## 2016-11-03 DIAGNOSIS — R1031 Right lower quadrant pain: Secondary | ICD-10-CM | POA: Diagnosis not present

## 2016-11-03 DIAGNOSIS — R102 Pelvic and perineal pain: Secondary | ICD-10-CM | POA: Insufficient documentation

## 2016-11-03 DIAGNOSIS — R1032 Left lower quadrant pain: Secondary | ICD-10-CM | POA: Diagnosis not present

## 2016-11-03 DIAGNOSIS — Z90710 Acquired absence of both cervix and uterus: Secondary | ICD-10-CM | POA: Insufficient documentation

## 2016-11-04 ENCOUNTER — Encounter: Payer: Self-pay | Admitting: Family Medicine

## 2016-11-04 DIAGNOSIS — Z0000 Encounter for general adult medical examination without abnormal findings: Secondary | ICD-10-CM | POA: Insufficient documentation

## 2016-11-04 HISTORY — DX: Encounter for general adult medical examination without abnormal findings: Z00.00

## 2016-11-04 NOTE — Assessment & Plan Note (Signed)
hgba1c acceptable, minimize simple carbs. Increase exercise as tolerated.  

## 2016-11-04 NOTE — Assessment & Plan Note (Signed)
Tolerating statin, encouraged heart healthy diet, avoid trans fats, minimize simple carbs and saturated fats. Increase exercise as tolerated 

## 2016-11-04 NOTE — Assessment & Plan Note (Signed)
Patient encouraged to maintain heart healthy diet, regular exercise, adequate sleep. Consider daily probiotics. Take medications as prescribed 

## 2016-11-04 NOTE — Assessment & Plan Note (Signed)
Well controlled, no changes to meds. Encouraged heart healthy diet such as the DASH diet and exercise as tolerated.  °

## 2016-11-04 NOTE — Assessment & Plan Note (Signed)
B/l lower quadrant pain, no obvious pattern but happens several times a week. No change in bowel or bladder habits. Proceed with imaging and encouraged to use fiber supplements and probiotics.

## 2016-11-04 NOTE — Assessment & Plan Note (Signed)
Doing much better on Seroquel she reports she feels well

## 2016-11-06 ENCOUNTER — Other Ambulatory Visit (INDEPENDENT_AMBULATORY_CARE_PROVIDER_SITE_OTHER): Payer: 59

## 2016-11-06 ENCOUNTER — Other Ambulatory Visit: Payer: Self-pay | Admitting: Family Medicine

## 2016-11-06 DIAGNOSIS — R1030 Lower abdominal pain, unspecified: Secondary | ICD-10-CM

## 2016-11-06 DIAGNOSIS — E785 Hyperlipidemia, unspecified: Secondary | ICD-10-CM | POA: Diagnosis not present

## 2016-11-06 LAB — LIPID PANEL
Cholesterol: 157 mg/dL (ref 0–200)
HDL: 61.6 mg/dL (ref >39.00)
LDL Cholesterol: 82 mg/dL (ref 0–99)
NonHDL: 95.13
Total CHOL/HDL Ratio: 3
Triglycerides: 67 mg/dL (ref 0.0–149.0)
VLDL: 13.4 mg/dL (ref 0.0–40.0)

## 2016-11-07 ENCOUNTER — Other Ambulatory Visit (INDEPENDENT_AMBULATORY_CARE_PROVIDER_SITE_OTHER): Payer: 59

## 2016-11-07 DIAGNOSIS — R1030 Lower abdominal pain, unspecified: Secondary | ICD-10-CM | POA: Diagnosis not present

## 2016-11-08 LAB — URINALYSIS, ROUTINE W REFLEX MICROSCOPIC
Bilirubin Urine: NEGATIVE
Ketones, ur: NEGATIVE
Leukocytes, UA: NEGATIVE
Nitrite: NEGATIVE
Specific Gravity, Urine: >=1.03 — AB (ref 1.000–1.030)
Urine Glucose: NEGATIVE
Urobilinogen, UA: 0.2 (ref 0.0–1.0)
pH: 6 (ref 5.0–8.0)

## 2016-11-08 LAB — SEDIMENTATION RATE: Sed Rate: 15 mm/hr (ref 0–30)

## 2016-11-11 ENCOUNTER — Other Ambulatory Visit: Payer: Self-pay | Admitting: Family Medicine

## 2016-12-03 ENCOUNTER — Other Ambulatory Visit: Payer: Self-pay | Admitting: Family Medicine

## 2017-01-04 ENCOUNTER — Encounter: Payer: Self-pay | Admitting: Gastroenterology

## 2017-01-04 ENCOUNTER — Other Ambulatory Visit (INDEPENDENT_AMBULATORY_CARE_PROVIDER_SITE_OTHER): Payer: 59

## 2017-01-04 ENCOUNTER — Ambulatory Visit (INDEPENDENT_AMBULATORY_CARE_PROVIDER_SITE_OTHER): Payer: 59 | Admitting: Gastroenterology

## 2017-01-04 VITALS — BP 160/98 | HR 84 | Ht 62.0 in | Wt 126.0 lb

## 2017-01-04 DIAGNOSIS — R1030 Lower abdominal pain, unspecified: Secondary | ICD-10-CM

## 2017-01-04 DIAGNOSIS — K921 Melena: Secondary | ICD-10-CM

## 2017-01-04 DIAGNOSIS — Z86010 Personal history of colonic polyps: Secondary | ICD-10-CM

## 2017-01-04 LAB — LIPASE: Lipase: 84 U/L — ABNORMAL HIGH (ref 11.0–59.0)

## 2017-01-04 LAB — COMPREHENSIVE METABOLIC PANEL
ALT: 20 U/L (ref 0–35)
AST: 20 U/L (ref 0–37)
Albumin: 4.3 g/dL (ref 3.5–5.2)
Alkaline Phosphatase: 53 U/L (ref 39–117)
BUN: 13 mg/dL (ref 6–23)
CO2: 32 mEq/L (ref 19–32)
Calcium: 9.8 mg/dL (ref 8.4–10.5)
Chloride: 104 mEq/L (ref 96–112)
Creatinine, Ser: 0.7 mg/dL (ref 0.40–1.20)
GFR: 110.42 mL/min (ref >60.00)
Glucose, Bld: 92 mg/dL (ref 70–99)
Potassium: 4 mEq/L (ref 3.5–5.1)
Sodium: 142 mEq/L (ref 135–145)
Total Bilirubin: 0.3 mg/dL (ref 0.2–1.2)
Total Protein: 7.4 g/dL (ref 6.0–8.3)

## 2017-01-04 LAB — CBC WITH DIFFERENTIAL/PLATELET
Basophils Absolute: 0.1 10*3/uL (ref 0.0–0.1)
Basophils Relative: 0.6 % (ref 0.0–3.0)
Eosinophils Absolute: 0.2 10*3/uL (ref 0.0–0.7)
Eosinophils Relative: 2.1 % (ref 0.0–5.0)
HCT: 38.7 % (ref 36.0–46.0)
Hemoglobin: 12.5 g/dL (ref 12.0–15.0)
Lymphocytes Relative: 34 % (ref 12.0–46.0)
Lymphs Abs: 3 10*3/uL (ref 0.7–4.0)
MCHC: 32.2 g/dL (ref 30.0–36.0)
MCV: 85.7 fl (ref 78.0–100.0)
Monocytes Absolute: 0.5 10*3/uL (ref 0.1–1.0)
Monocytes Relative: 5.5 % (ref 3.0–12.0)
Neutro Abs: 5.2 10*3/uL (ref 1.4–7.7)
Neutrophils Relative %: 57.8 % (ref 43.0–77.0)
Platelets: 329 10*3/uL (ref 150.0–400.0)
RBC: 4.52 Mil/uL (ref 3.87–5.11)
RDW: 15 % (ref 11.5–15.5)
WBC: 8.9 10*3/uL (ref 4.0–10.5)

## 2017-01-04 NOTE — Progress Notes (Signed)
This is a 56 year old female with intermittent hematochezia and intermittent lower abdominal pain. She complains of intermittent lower abdominal pain and intermittent small amounts of bright red blood with bowel movements. Symptoms have been present for several months. His evaluated by Dr. life for the same symptoms and August. She underwent colonoscopy last year as below. Recent US below. She denies fevers, chills, change in bowel habits, constipation, diarrhea.   Pelvic US 10/2015 IMPRESSION: The uterus is surgically absent.  Neither ovary was visualized.   Colonoscopy 07/2015:  - One 5 mm polyp at the appendiceal orifice, removed with a cold snare. Resected and retrieved. - Two 6 to 7 mm polyps in the transverse colon, removed with a cold snare. Resected and retrieved. - A tattoo was seen at the splenic flexure. A post-polypectomy scar was found at the tattoo site. There was no evidence of residual polyp tissue. - Internal hemorrhoids.   Current Medications, Allergies, Past Medical History, Past Surgical History, Family History and Social History were reviewed in [employer] record.  Physical Exam: General: Well developed, well nourished, no acute distress Head: Normocephalic and atraumatic Eyes:  sclerae anicteric, EOMI Ears: Normal auditory acuity Mouth: No deformity or lesions Lungs: Clear throughout to auscultation Heart: Regular rate and rhythm; no murmurs, rubs or bruits Abdomen: Soft, lower abdominal tenderness and non distended. No masses, hepatosplenomegaly or hernias noted. Normal Bowel sounds Rectal: no lesions, heme neg brown stool Musculoskeletal: Symmetrical with no gross deformities  Pulses:  Normal pulses noted Extremities: No clubbing, cyanosis, edema or deformities noted Neurological: Alert oriented x 4, grossly nonfocal Psychological:  Alert and cooperative. Normal mood and affect  Assessment and Recommendations:  1. Intermittent  hematochezia and intermittent lower abdominal pain for 2 months. Rule out intra-abdominal inflammatory or neoplastic process. Suspected hemorrhoidal bleeding and functional abdominal pain. Dicyclomine 10 mg po tid prn. Prep H supp qd prn. CMP, CBC, lipase today and schedule abd/pevlic CT. REV in 1 month.

## 2017-01-04 NOTE — Patient Instructions (Addendum)
Your physician has requested that you go to the basement for lab work before leaving today.  We have sent the following medications to your pharmacy for you to pick up at your convenience: Bentyl.  You can use preperation H suppositories daily for rectal bleeding.   RECTAL CARE INSTRUCTIONS:  1. Sitz Baths twice a day for 10 minutes each. 2. Thoroughly clean and dry the rectum. 3. Put Tucks pad against the rectum at night. 4. Clean the rectum with Balenol lotion after each bowel movement.   You have been scheduled for a CT scan of the abdomen and pelvis at [facility] ([address]---this is in the same building as Press [facility]).   You are scheduled on 01/09/17 at 1:30pm. You should arrive 15 minutes prior to your appointment time for registration. Please follow the written instructions below on the day of your exam:  WARNING: IF YOU ARE ALLERGIC TO IODINE/X-RAY DYE, PLEASE NOTIFY RADIOLOGY IMMEDIATELY AT [phone]! YOU WILL BE GIVEN A 13 HOUR PREMEDICATION PREP.  1) Do not eat or drink anything after 9:30am (4 hours prior to your test) 2) You have been given 2 bottles of oral contrast to drink. The solution may taste               better if refrigerated, but do NOT add ice or any other liquid to this solution. Shake             well before drinking.    Drink 1 bottle of contrast @ 11:30am (2 hours prior to your exam)  Drink 1 bottle of contrast @ 12:30pm (1 hour prior to your exam)  You may take any medications as prescribed with a small amount of water except for the following: Metformin, Glucophage, Glucovance, Avandamet, Riomet, Fortamet, Actoplus Met, Janumet, Glumetza or Metaglip. The above medications must be held the day of the exam AND 48 hours after the exam.  The purpose of you drinking the oral contrast is to aid in the visualization of your intestinal tract. The contrast solution may cause some diarrhea. Before your exam is started, you will be given a  small amount of fluid to drink. Depending on your individual set of symptoms, you may also receive an intravenous injection of x-ray contrast/dye. Plan on being at [facility] for 30 minutes or longer, depending on the type of exam you are having performed.  This test typically takes 30-45 minutes to complete.  If you have any questions regarding your exam or if you need to reschedule, you may call the CT department at [phone] between the hours of 8:00 am and 5:00 pm, Monday-Friday.  ________________________________________________________________________  Thank you for choosing me and [facility].  Pricilla Riffle. Dagoberto Ligas., MD., Marval Regal

## 2017-01-08 ENCOUNTER — Other Ambulatory Visit: Payer: Self-pay

## 2017-01-08 ENCOUNTER — Other Ambulatory Visit: Payer: Self-pay | Admitting: Physician Assistant

## 2017-01-08 DIAGNOSIS — R748 Abnormal levels of other serum enzymes: Secondary | ICD-10-CM

## 2017-01-08 DIAGNOSIS — R059 Cough, unspecified: Secondary | ICD-10-CM

## 2017-01-08 DIAGNOSIS — R05 Cough: Secondary | ICD-10-CM

## 2017-01-09 ENCOUNTER — Ambulatory Visit (INDEPENDENT_AMBULATORY_CARE_PROVIDER_SITE_OTHER)
Admission: RE | Admit: 2017-01-09 | Discharge: 2017-01-09 | Disposition: A | Discharge status: Home / Self Care | Payer: 59 | Source: Ambulatory Visit | Attending: Gastroenterology | Admitting: Gastroenterology

## 2017-01-09 ENCOUNTER — Telehealth: Payer: Self-pay

## 2017-01-09 ENCOUNTER — Other Ambulatory Visit: Payer: Self-pay | Admitting: Gastroenterology

## 2017-01-09 DIAGNOSIS — R1030 Lower abdominal pain, unspecified: Secondary | ICD-10-CM | POA: Diagnosis not present

## 2017-01-09 MED ORDER — IOPAMIDOL (ISOVUE-300) INJECTION 61%: 100.0000 mL | Freq: Once | INTRAVENOUS | Status: DC | PRN

## 2017-01-09 NOTE — Telephone Encounter (Signed)
Received call from [facility] that they are unable to obtain IV access for IV contrast. Per Dr. Fuller Plan ok to proceed with oral contrast only.

## 2017-01-18 ENCOUNTER — Encounter: Payer: Self-pay | Admitting: Cardiology

## 2017-01-18 ENCOUNTER — Ambulatory Visit (INDEPENDENT_AMBULATORY_CARE_PROVIDER_SITE_OTHER): Payer: 59 | Admitting: Cardiology

## 2017-01-18 VITALS — BP 160/88 | HR 78 | Ht 62.0 in | Wt 122.8 lb

## 2017-01-18 DIAGNOSIS — M25512 Pain in left shoulder: Secondary | ICD-10-CM | POA: Diagnosis not present

## 2017-01-18 DIAGNOSIS — R002 Palpitations: Secondary | ICD-10-CM | POA: Diagnosis not present

## 2017-01-18 DIAGNOSIS — R55 Syncope and collapse: Secondary | ICD-10-CM

## 2017-01-18 DIAGNOSIS — I10 Essential (primary) hypertension: Secondary | ICD-10-CM

## 2017-01-18 MED ORDER — METOPROLOL TARTRATE 75 MG PO TABS: 75.0000 mg | ORAL_TABLET | Freq: Two times a day (BID) | ORAL | 3 refills | Status: DC

## 2017-01-18 NOTE — Patient Instructions (Signed)
Medication Instructions:   INCREASE METOPROLOL TO 75 MG TWICE DAILY= 1 AND 1/2 OF THE 50 MG TABLETS TWICE DAILY  Follow-Up:  Your physician wants you to follow-up in: [place] will receive a reminder letter in the mail two months in advance. If you don't receive a letter, please call our office to schedule the follow-up appointment.   If you need a refill on your cardiac medications before your next appointment, please call your pharmacy.

## 2017-01-18 NOTE — Progress Notes (Addendum)
PCP: Mosie Lukes, MD  Clinic Note: Chief Complaint  Patient presents with  . Follow-up  . Palpitations  . Chest Pain    Left upper chest/shoulder    HPI: Ruth Bell is a 56 y.o. female with a PMH below who presents today for six-month follow-up for chest pain with cardiac risk factors.  Ruth Bell was last seen in March 2018 to follow a Myoview that was negative/low risk.  This is to evaluate some atypical chest pain.  There is also some concern of possible syncopal episodes versus pseudoseizure.  Normal monitor normal echo noted in the past.  Recent Hospitalizations: None  Studies Personally Reviewed - (if available, images/films reviewed: From Epic Chart or Care Everywhere)  No new studies  Interval History: Ruth Bell returns today overall feeling quite well from a cardiac standpoint.  Ruth Bell still has off and on palpitations it may last anywhere from 30 seconds to 5 minutes at the most.  Usually they do not feel like they are going fast, just somewhat pounding type as a sensation.  They seem to get worse when Ruth Bell gets agitated, as long as Ruth Bell is able to stay calm things do not get too fast.  The main symptoms Ruth Bell is noticing now is pain in her left shoulder both in the front of her chest and left upper side as well as behind the shoulder on the upper portion of the shoulder blade.  It can sometimes go down into the deltoid and bicep muscles.  Worse with certain movements, but not with exertion unless Ruth Bell is Ruth Bell denies any recurrent chest tightness or pressure at rest or exertion.  Despite having some palpitations, Ruth Bell has not had any further syncope or near syncope type episodes.  No resting or exertional dyspnea No heart failure symptoms of PND, orthopnea or edema.  No TIA or amaurosis fugax symptoms.  No claudication.  ROS: A comprehensive was performed. Review of Systems  Constitutional: Negative for malaise/fatigue.  HENT: Negative for congestion and nosebleeds.     Respiratory: Negative for cough and shortness of breath.   Cardiovascular: Positive for palpitations.  Gastrointestinal: Negative for blood in stool and melena.  Genitourinary: Negative for hematuria.  Musculoskeletal: Positive for joint pain (Left shoulder).  Neurological: Positive for dizziness (Positional) and headaches. Negative for seizures (No recent seizures).  Psychiatric/Behavioral: Negative for depression and memory loss. The patient is nervous/anxious. The patient does not have insomnia.   All other systems reviewed and are negative.  I have reviewed and (if needed) personally updated the patient's problem list, medications, allergies, past medical and surgical history, social and family history.   Past Medical History:  Diagnosis Date  . Anemia   . Anxiety   . Arm skin lesion, left 09/05/2014  . Constipation 12/27/2013  . Depression   . Dysphagia 04/15/2016  . Eczema 08/22/2015  . GERD (gastroesophageal reflux disease)   . Hematuria 03/04/2016  . History of shingles 01/22/2016  . Hyperglycemia 01/15/2015  . Hyperlipidemia, mixed 08/22/2015  . Hypertension   . Menometrorrhagia 2006  . Migraine, unspecified, without mention of intractable migraine without mention of status migrainosus   . Preventative health care 11/04/2016  . Pseudoseizures   . Seizures ([place])    history of pseudoseizure disorder, last last seizure 3 wks, keppra inc in dosage  . Tubular adenoma of colon 05/2011  . Uterine leiomyoma 2006    Past Surgical History:  Procedure Laterality Date  . ABDOMINAL HYSTERECTOMY  04/06/04  .  APPENDECTOMY    . BILATERAL SALPINGOOPHORECTOMY  04/06/04  . ROTATOR CUFF REPAIR      Myoview stress test 04/20/2013: Normal LV function with EF 60%. LOW RISK. Normal  2D echo December 2017: Normal EF 55-60%.  Normal valve function.  Normal wall motion.  GR 1 DD.  Current Meds  Medication Sig  . ALPRAZolam (XANAX) 0.5 MG tablet Take 1 tablet (0.5 mg total) by mouth 2 (two)  times daily as needed for anxiety.  Marland Kitchen atorvastatin (LIPITOR) 10 MG tablet Take 1 tablet (10 mg total) by mouth daily.  . Azelastine HCl 0.15 % SOLN Place 2 sprays into both nostrils 2 (two) times daily.  . baclofen (LIORESAL) 10 MG tablet TAKE 1/2 TABLET BY MOUTH 3 TIMES A DAY  . benzonatate (TESSALON) 100 MG capsule TAKE 1-2 CAPSULES BY MOUTH  3 TIMES DAILY AS NEEDED FOR COUGH.  . citalopram (CELEXA) 20 MG tablet Take 1 tablet (20 mg total) by mouth daily.  . cyclobenzaprine (FLEXERIL) 10 MG tablet Take 1 tablet (10 mg total) by mouth 3 (three) times daily as needed for muscle spasms.  . famciclovir (FAMVIR) 500 MG tablet Take 1 tablet (500 mg total) by mouth 3 (three) times daily.  Marland Kitchen gabapentin (NEURONTIN) 600 MG tablet Take 1 tablet (600 mg total) by mouth 3 (three) times daily. Reported on 05/26/2015  . Guaifenesin (MUCINEX MAXIMUM STRENGTH) 1200 MG TB12 Take 1 tablet (1,200 mg total) by mouth every 12 (twelve) hours as needed.  . levETIRAcetam (KEPPRA) 500 MG tablet TAKE 1 TABLET (500 MG TOTAL) BY MOUTH TWO (2) TIMES A DAY.  . metoprolol tartrate 75 MG TABS Take 75 mg by mouth 2 (two) times daily.  . ondansetron (ZOFRAN ODT) 4 MG disintegrating tablet Take 1 tablet (4 mg total) by mouth every 8 (eight) hours as needed for nausea or vomiting.  . pantoprazole (PROTONIX) 40 MG tablet Take 1 tablet (40 mg total) by mouth 2 (two) times daily. TAKE 1 TABLET (40 MG TOTAL) BY MOUTH DAILY.  Marland Kitchen QUEtiapine (SEROQUEL) 300 MG tablet Take 1 tablet (300 mg total) by mouth at bedtime.  . ranitidine (ZANTAC) 150 MG tablet Take 1 tablet (150 mg total) by mouth 2 (two) times daily.  Marland Kitchen tiZANidine (ZANAFLEX) 4 MG tablet Take 4 mg by mouth every 8 (eight) hours as needed for muscle spasms.  . [DISCONTINUED] metoprolol tartrate (LOPRESSOR) 50 MG tablet TAKE 1 TABLET BY MOUTH TWO  TIMES DAILY    No Known Allergies  Social History   Socioeconomic History  . Marital status: Married    Spouse name: Gwyndolyn Saxon  .  Number of children: 2  . Years of education: None  . Highest education level: None  Social Needs  . Financial resource strain: None  . Food insecurity - worry: None  . Food insecurity - inability: None  . Transportation needs - medical: None  . Transportation needs - non-medical: None  Occupational History  . Occupation: CMA-retired  Tobacco Use  . Smoking status: Former Smoker    Packs/day: 0.50    Years: 40.00    Pack years: 20.00    Types: Cigarettes    Start date: 12/18/2015  . Smokeless tobacco: Never Used  . Tobacco comment: 3-4 cigarettes daily  Substance and Sexual Activity  . Alcohol use: No  . Drug use: No  . Sexual activity: None    Comment: lives with husband, no dietary restrictions.   Other Topics Concern  . None  Social History Narrative  Lives with her husband and their son.   Daughter lives in [place], [place].    family history includes Cancer in her brother, father, sister, and sister; Diabetes in her sister; Heart attack in her mother and sister; Heart disease in her mother; Hypertension in her brother, father, and sister.  Wt Readings from Last 3 Encounters:  01/18/17 122 lb 12.8 oz (55.7 kg)  01/04/17 126 lb (57.2 kg)  10/30/16 123 lb (55.8 kg)    PHYSICAL EXAM BP (!) 160/88   Pulse 78   Ht 5\' 2"  (1.575 m)   Wt 122 lb 12.8 oz (55.7 kg)   LMP  (LMP Unknown)   SpO2 95%   BMI 22.46 kg/m  Physical Exam  Constitutional: Ruth Bell is oriented to person, place, and time. Ruth Bell appears well-developed and well-nourished. No distress.  Healthy-appearing.  Well-groomed  HENT:  Head: Normocephalic and atraumatic.  Eyes: EOM are normal. No scleral icterus.  Neck: Normal range of motion. Neck supple. No hepatojugular reflux and no JVD present. Carotid bruit is not present.  Cardiovascular: Normal rate, regular rhythm, normal heart sounds, intact distal pulses and normal pulses.  No extrasystoles are present. PMI is not displaced. Exam reveals no gallop and no  friction rub.  No murmur heard. Pulmonary/Chest: Effort normal and breath sounds normal. No respiratory distress. Ruth Bell has no wheezes. Ruth Bell has no rales.  Abdominal: Soft. Bowel sounds are normal. Ruth Bell exhibits no distension. There is no tenderness.  Musculoskeletal: Normal range of motion. Ruth Bell exhibits no edema.  Point tenderness in the anterior aspect of the shoulder as well as along the left upper chest toward the shoulder.  Also point tenderness in the supra and infraspinatus region of the rotator cuff.  These areas are notably painful with any internal or external rotation of the shoulder as well as abduction or abduction.  Ruth Bell is unable to raise her arm above the level of the shoulder.  Neurological: Ruth Bell is alert and oriented to person, place, and time.  Skin: Skin is dry. No erythema.  Psychiatric: Ruth Bell has a normal mood and affect. Her behavior is normal. Judgment and thought content normal.  Nursing note and vitals reviewed.    Adult ECG Report Not checked  Other studies Reviewed: Additional studies/ records that were reviewed today include:  Recent Labs:  N/a     ASSESSMENT / PLAN: Problem List Items Addressed This Visit    Essential hypertension (Chronic)    Blood pressure is usually well controlled, however elevated today because Ruth Bell is in quite a bit of pain.  I will increase metoprolol to 75 mg twice daily.      Relevant Medications   metoprolol tartrate 75 MG TABS   Palpitations - Primary (Chronic)    Relatively stable now on current dose of beta-blocker however Ruth Bell is still having some symptoms.  With her blood pressure being elevated we will increase to 75 mg twice daily metoprolol      Shoulder pain    Ruth Bell has had a history of rotator cuff repair before.  This is the area of significant pain in her left shoulder.  I recommended that Ruth Bell see her orthopedic surgeon to discuss this.  I think this is probably 1 of the reason why her blood pressure is high.  The "chest  pain" noted above is actually left upper chest to the shoulder and not actual chest pain.      SYNCOPE (Chronic)    No clear cardiac etiology found to explain her  episodes.  Probably related to pseudoseizure.  Thankfully Ruth Bell has not had any further episodes.  No arrhythmia noted on monitor.      Relevant Medications   metoprolol tartrate 75 MG TABS      Current medicines are reviewed at length with the patient today. (+/- concerns) n/a The following changes have been made: n/a  Patient Instructions  Medication Instructions:   INCREASE METOPROLOL TO 75 MG TWICE DAILY= 1 AND 1/2 OF THE 50 MG TABLETS TWICE DAILY  Follow-Up:  Your physician wants you to follow-up in: [place] will receive a reminder letter in the mail two months in advance. If you don't receive a letter, please call our office to schedule the follow-up appointment.   If you need a refill on your cardiac medications before your next appointment, please call your pharmacy.      Studies Ordered:   No orders of the defined types were placed in this encounter.     Glenetta Hew, M.D., M.S. Interventional Cardiologist   Pager # [phone] Phone # [phone] [address] [place], [place] [address]

## 2017-01-20 ENCOUNTER — Encounter: Payer: Self-pay | Admitting: Cardiology

## 2017-01-21 NOTE — Assessment & Plan Note (Signed)
She has had a history of rotator cuff repair before.  This is the area of significant pain in her left shoulder.  I recommended that she see her orthopedic surgeon to discuss this.  I think this is probably 1 of the reason why her blood pressure is high.  The "chest pain" noted above is actually left upper chest to the shoulder and not actual chest pain.

## 2017-01-21 NOTE — Assessment & Plan Note (Signed)
Relatively stable now on current dose of beta-blocker however she is still having some symptoms.  With her blood pressure being elevated we will increase to 75 mg twice daily metoprolol

## 2017-01-21 NOTE — Assessment & Plan Note (Signed)
Blood pressure is usually well controlled, however elevated today because she is in quite a bit of pain.  I will increase metoprolol to 75 mg twice daily.

## 2017-01-21 NOTE — Assessment & Plan Note (Signed)
No clear cardiac etiology found to explain her episodes.  Probably related to pseudoseizure.  Thankfully she has not had any further episodes.  No arrhythmia noted on monitor.

## 2017-01-22 ENCOUNTER — Other Ambulatory Visit: Payer: Self-pay | Admitting: Physician Assistant

## 2017-01-22 DIAGNOSIS — L309 Dermatitis, unspecified: Secondary | ICD-10-CM

## 2017-01-27 ENCOUNTER — Other Ambulatory Visit: Payer: Self-pay | Admitting: Family Medicine

## 2017-01-29 ENCOUNTER — Encounter: Payer: Self-pay | Admitting: Family Medicine

## 2017-01-29 ENCOUNTER — Ambulatory Visit (INDEPENDENT_AMBULATORY_CARE_PROVIDER_SITE_OTHER): Payer: 59 | Admitting: Family Medicine

## 2017-01-29 VITALS — BP 142/80 | HR 65 | Temp 98.1°F | Resp 18 | Wt 125.2 lb

## 2017-01-29 DIAGNOSIS — M5442 Lumbago with sciatica, left side: Secondary | ICD-10-CM

## 2017-01-29 DIAGNOSIS — M25512 Pain in left shoulder: Secondary | ICD-10-CM

## 2017-01-29 DIAGNOSIS — R739 Hyperglycemia, unspecified: Secondary | ICD-10-CM

## 2017-01-29 DIAGNOSIS — G8929 Other chronic pain: Secondary | ICD-10-CM | POA: Diagnosis not present

## 2017-01-29 DIAGNOSIS — I10 Essential (primary) hypertension: Secondary | ICD-10-CM

## 2017-01-29 DIAGNOSIS — Z79899 Other long term (current) drug therapy: Secondary | ICD-10-CM

## 2017-01-29 DIAGNOSIS — E782 Mixed hyperlipidemia: Secondary | ICD-10-CM | POA: Diagnosis not present

## 2017-01-29 MED ORDER — AMLODIPINE BESYLATE 2.5 MG PO TABS: 2.5000 mg | ORAL_TABLET | Freq: Every day | ORAL | 3 refills | Status: DC

## 2017-01-29 MED ORDER — BUSPIRONE HCL 7.5 MG PO TABS: 7.5000 mg | ORAL_TABLET | Freq: Three times a day (TID) | ORAL | 1 refills | Status: DC

## 2017-01-29 NOTE — Progress Notes (Signed)
Subjective:  I acted as a Education administrator for Dr. Charlett Blake. [name], [place]  Patient ID: Ruth Bell, female    DOB: [birth year], 58 y.o.   MRN: [id number]  No chief complaint on file.   HPI  Patient is in today for a 3 month follow up and is struggling with ongoing anxiety. No suicidal ideation. She is having intermittent episodes of chest discomfort but not frequent and no associated symptoms. Also notes palpitations at times but no other symptoms at that time and has sob at times. No recent febrile illness or hospitalizations. Denies HA/congestion/fevers/GI or GU c/o. Taking meds as prescribed  Patient Care Team: Mosie Lukes, MD as PCP - General (Family Medicine)   Past Medical History:  Diagnosis Date  . Anemia   . Anxiety   . Arm skin lesion, left 09/05/2014  . Constipation 12/27/2013  . Depression   . Dysphagia 04/15/2016  . Eczema 08/22/2015  . GERD (gastroesophageal reflux disease)   . Hematuria 03/04/2016  . History of shingles 01/22/2016  . Hyperglycemia 01/15/2015  . Hyperlipidemia, mixed 08/22/2015  . Hypertension   . Menometrorrhagia 2006  . Migraine, unspecified, without mention of intractable migraine without mention of status migrainosus   . Preventative health care 11/04/2016  . Pseudoseizures   . Seizures ([place])    history of pseudoseizure disorder, last last seizure 3 wks, keppra inc in dosage  . Tubular adenoma of colon 05/2011  . Uterine leiomyoma 2006    Past Surgical History:  Procedure Laterality Date  . ABDOMINAL HYSTERECTOMY  04/06/04  . APPENDECTOMY    . BILATERAL SALPINGOOPHORECTOMY  04/06/04  . EXCISION LEFT ARM MASS Left 06/02/2015   Performed by Jovita Kussmaul, MD at [facility]  . ROTATOR CUFF REPAIR      Family History  Problem Relation Age of Onset  . Heart disease Mother   . Heart attack Mother   . Cancer Father        colon  . Hypertension Father   . Cancer Sister        brain  . Cancer Brother   . Hypertension Sister   .  Cancer Sister        unsure of type  . Diabetes Sister        type 2  . Heart attack Sister   . Hypertension Brother   . Colon cancer Neg Hx     Social History   Socioeconomic History  . Marital status: Married    Spouse name: Gwyndolyn Saxon  . Number of children: 2  . Years of education: Not on file  . Highest education level: Not on file  Social Needs  . Financial resource strain: Not on file  . Food insecurity - worry: Not on file  . Food insecurity - inability: Not on file  . Transportation needs - medical: Not on file  . Transportation needs - non-medical: Not on file  Occupational History  . Occupation: CMA-retired  Tobacco Use  . Smoking status: Former Smoker    Packs/day: 0.50    Years: 40.00    Pack years: 20.00    Types: Cigarettes    Start date: 12/18/2015  . Smokeless tobacco: Never Used  . Tobacco comment: 3-4 cigarettes daily  Substance and Sexual Activity  . Alcohol use: No  . Drug use: No  . Sexual activity: Not on file    Comment: lives with husband, no dietary restrictions.   Other Topics Concern  . Not on  file  Social History Narrative   Lives with her husband and their son.   Daughter lives in [place], [place].    Outpatient Medications Prior to Visit  Medication Sig Dispense Refill  . atorvastatin (LIPITOR) 10 MG tablet TAKE 1 TABLET BY MOUTH  DAILY 90 tablet 1  . Azelastine HCl 0.15 % SOLN Place 2 sprays into both nostrils 2 (two) times daily. 30 mL 0  . baclofen (LIORESAL) 10 MG tablet TAKE 1/2 TABLET BY MOUTH 3 TIMES A DAY  4  . benzonatate (TESSALON) 100 MG capsule TAKE 1-2 CAPSULES BY MOUTH  3 TIMES DAILY AS NEEDED FOR COUGH. 40 capsule 0  . citalopram (CELEXA) 20 MG tablet Take 1 tablet (20 mg total) by mouth daily. 90 tablet 1  . cyclobenzaprine (FLEXERIL) 10 MG tablet Take 1 tablet (10 mg total) by mouth 3 (three) times daily as needed for muscle spasms. 90 tablet 1  . famciclovir (FAMVIR) 500 MG tablet Take 1 tablet (500 mg total) by mouth 3  (three) times daily. 21 tablet 0  . gabapentin (NEURONTIN) 600 MG tablet Take 1 tablet (600 mg total) by mouth 3 (three) times daily. Reported on 05/26/2015 90 tablet 1  . Guaifenesin (MUCINEX MAXIMUM STRENGTH) 1200 MG TB12 Take 1 tablet (1,200 mg total) by mouth every 12 (twelve) hours as needed. 14 tablet 1  . levETIRAcetam (KEPPRA) 500 MG tablet TAKE 1 TABLET (500 MG TOTAL) BY MOUTH TWO (2) TIMES A DAY.  3  . metoprolol tartrate 75 MG TABS Take 75 mg by mouth 2 (two) times daily. 180 tablet 3  . ondansetron (ZOFRAN ODT) 4 MG disintegrating tablet Take 1 tablet (4 mg total) by mouth every 8 (eight) hours as needed for nausea or vomiting. 20 tablet 0  . pantoprazole (PROTONIX) 40 MG tablet Take 1 tablet (40 mg total) by mouth 2 (two) times daily. TAKE 1 TABLET (40 MG TOTAL) BY MOUTH DAILY. 180 tablet 2  . QUEtiapine (SEROQUEL) 300 MG tablet Take 1 tablet (300 mg total) by mouth at bedtime. 90 tablet 1  . ranitidine (ZANTAC) 150 MG tablet Take 1 tablet (150 mg total) by mouth 2 (two) times daily. 180 tablet 2  . tiZANidine (ZANAFLEX) 4 MG tablet Take 4 mg by mouth every 8 (eight) hours as needed for muscle spasms.    . ALPRAZolam (XANAX) 0.5 MG tablet Take 1 tablet (0.5 mg total) by mouth 2 (two) times daily as needed for anxiety. 60 tablet 3   No facility-administered medications prior to visit.     No Known Allergies  Review of Systems  Constitutional: Negative for fever and malaise/fatigue.  HENT: Negative for congestion.   Eyes: Negative for blurred vision.  Respiratory: Positive for shortness of breath.   Cardiovascular: Positive for palpitations. Negative for chest pain and leg swelling.  Gastrointestinal: Negative for abdominal pain, blood in stool and nausea.  Genitourinary: Negative for dysuria and frequency.  Musculoskeletal: Positive for back pain. Negative for falls.  Skin: Negative for rash.  Neurological: Negative for dizziness, loss of consciousness and headaches.    Endo/Heme/Allergies: Negative for environmental allergies.  Psychiatric/Behavioral: Negative for depression. The patient is nervous/anxious.        Objective:    Physical Exam  Constitutional: She is oriented to person, place, and time. She appears well-developed and well-nourished. No distress.  HENT:  Head: Normocephalic and atraumatic.  Nose: Nose normal.  Eyes: Right eye exhibits no discharge. Left eye exhibits no discharge.  Neck: Normal range of  motion. Neck supple.  Cardiovascular: Normal rate and regular rhythm.  No murmur heard. Pulmonary/Chest: Effort normal and breath sounds normal.  Abdominal: Soft. Bowel sounds are normal. There is no tenderness.  Musculoskeletal: She exhibits no edema.  Neurological: She is alert and oriented to person, place, and time.  Skin: Skin is warm and dry.  Psychiatric: She has a normal mood and affect.  Nursing note and vitals reviewed.   BP (!) 142/80   Pulse 65   Temp 98.1 F (36.7 C) (Oral)   Resp 18   Wt 125 lb 3.2 oz (56.8 kg)   LMP  (LMP Unknown)   SpO2 99%   BMI 22.90 kg/m  Wt Readings from Last 3 Encounters:  01/29/17 125 lb 3.2 oz (56.8 kg)  01/18/17 122 lb 12.8 oz (55.7 kg)  01/04/17 126 lb (57.2 kg)   BP Readings from Last 3 Encounters:  02/03/17 (!) 142/80  01/18/17 (!) 160/88  01/04/17 (!) 160/98     Immunization History  Administered Date(s) Administered  . Influenza Split 03/06/2011, 02/29/2012  . Pneumococcal Polysaccharide-23 01/09/2008, 02/23/2012  . Td 04/28/2007    Health Maintenance  Topic Date Due  . INFLUENZA VACCINE  04/15/2017 (Originally 10/17/2016)  . TETANUS/TDAP  04/27/2017  . MAMMOGRAM  05/29/2017  . COLONOSCOPY  07/26/2020  . Hepatitis C Screening  Completed  . HIV Screening  Completed    Lab Results  Component Value Date   WBC 8.9 01/04/2017   HGB 12.5 01/04/2017   HCT 38.7 01/04/2017   PLT 329.0 01/04/2017   GLUCOSE 92 01/04/2017   CHOL 157 11/06/2016   TRIG 67.0 11/06/2016    HDL 61.60 11/06/2016   LDLCALC 82 11/06/2016   ALT 20 01/04/2017   AST 20 01/04/2017   NA 142 01/04/2017   K 4.0 01/04/2017   CL 104 01/04/2017   CREATININE 0.70 01/04/2017   BUN 13 01/04/2017   CO2 32 01/04/2017   TSH 0.63 10/02/2016   INR 1.0 11/19/2013   HGBA1C 6.2 10/02/2016    Lab Results  Component Value Date   TSH 0.63 10/02/2016   Lab Results  Component Value Date   WBC 8.9 01/04/2017   HGB 12.5 01/04/2017   HCT 38.7 01/04/2017   MCV 85.7 01/04/2017   PLT 329.0 01/04/2017   Lab Results  Component Value Date   NA 142 01/04/2017   K 4.0 01/04/2017   CO2 32 01/04/2017   GLUCOSE 92 01/04/2017   BUN 13 01/04/2017   CREATININE 0.70 01/04/2017   BILITOT 0.3 01/04/2017   ALKPHOS 53 01/04/2017   AST 20 01/04/2017   ALT 20 01/04/2017   PROT 7.4 01/04/2017   ALBUMIN 4.3 01/04/2017   CALCIUM 9.8 01/04/2017   ANIONGAP 7 01/10/2016   GFR 110.42 01/04/2017   Lab Results  Component Value Date   CHOL 157 11/06/2016   Lab Results  Component Value Date   HDL 61.60 11/06/2016   Lab Results  Component Value Date   LDLCALC 82 11/06/2016   Lab Results  Component Value Date   TRIG 67.0 11/06/2016   Lab Results  Component Value Date   CHOLHDL 3 11/06/2016   Lab Results  Component Value Date   HGBA1C 6.2 10/02/2016         Assessment & Plan:   Problem List Items Addressed This Visit    Essential hypertension (Chronic)    Well controlled, no changes to meds. Encouraged heart healthy diet such as the DASH diet and exercise  as tolerated.       Relevant Medications   amLODipine (NORVASC) 2.5 MG tablet   Low back pain with left-sided sciatica    Try Lidocaine and tizanidine and referred to ortho for further consideration      Relevant Medications   busPIRone (BUSPAR) 7.5 MG tablet   Other Relevant Orders   Ambulatory referral to Orthopedic Surgery   Shoulder pain   Relevant Orders   Ambulatory referral to Orthopedic Surgery   Hyperglycemia     minimize simple carbs. Increase exercise as tolerated.       Hyperlipidemia, mixed    Encouraged heart healthy diet, increase exercise, avoid trans fats, consider a krill oil cap daily. Tolerating Atorvastatin      Relevant Medications   amLODipine (NORVASC) 2.5 MG tablet    Other Visit Diagnoses    High risk medication use    -  Primary      I have discontinued Ruth Bell's ALPRAZolam. I am also having her start on busPIRone and amLODipine. Additionally, I am having her maintain her cyclobenzaprine, levETIRAcetam, Azelastine HCl, Guaifenesin, gabapentin, ondansetron, famciclovir, benzonatate, ranitidine, pantoprazole, baclofen, tiZANidine, QUEtiapine, citalopram, Metoprolol Tartrate, and atorvastatin.  Meds ordered this encounter  Medications  . busPIRone (BUSPAR) 7.5 MG tablet    Sig: Take 1 tablet (7.5 mg total) 3 (three) times daily by mouth.    Dispense:  90 tablet    Refill:  1  . amLODipine (NORVASC) 2.5 MG tablet    Sig: Take 1 tablet (2.5 mg total) daily by mouth.    Dispense:  30 tablet    Refill:  3    CMA served as scribe during this visit. History, Physical and Plan performed by medical provider. Documentation and orders reviewed and attested to.  [name], MD

## 2017-01-29 NOTE — Patient Instructions (Addendum)
Miralax and Benefiber together twice daily Encouraged increased hydration and fiber in diet. Daily probiotics. If bowels not moving can use MOM 2 tbls po in 4 oz of warm prune juice by mouth every 2-3 days. If no results then repeat in 4 hours with  Dulcolax suppository pr, may repeat again in 4 more hours as needed. Seek care if symptoms worsen. Consider daily Miralax and/or Dulcolax if symptoms persist.   Back Pain, Adult Back pain is very common. The pain often gets better over time. The cause of back pain is usually not dangerous. Most people can learn to manage their back pain on their own. Follow these instructions at home: Watch your back pain for any changes. The following actions may help to lessen any pain you are feeling:  Stay active. Start with short walks on flat ground if you can. Try to walk farther each day.  Exercise regularly as told by your doctor. Exercise helps your back heal faster. It also helps avoid future injury by keeping your muscles strong and flexible.  Do not sit, drive, or stand in one place for more than 30 minutes.  Do not stay in bed. Resting more than 1-2 days can slow down your recovery.  Be careful when you bend or lift an object. Use good form when lifting: ? Bend at your knees. ? Keep the object close to your body. ? Do not twist.  Sleep on a firm mattress. Lie on your side, and bend your knees. If you lie on your back, put a pillow under your knees.  Take medicines only as told by your doctor.  Put ice on the injured area. ? Put ice in a plastic bag. ? Place a towel between your skin and the bag. ? Leave the ice on for 20 minutes, 2-3 times a day for the first 2-3 days. After that, you can switch between ice and heat packs.  Avoid feeling anxious or stressed. Find good ways to deal with stress, such as exercise.  Maintain a healthy weight. Extra weight puts stress on your back.  Contact a doctor if:  You have pain that does not go away  with rest or medicine.  You have worsening pain that goes down into your legs or buttocks.  You have pain that does not get better in one week.  You have pain at night.  You lose weight.  You have a fever or chills. Get help right away if:  You cannot control when you poop (bowel movement) or pee (urinate).  Your arms or legs feel weak.  Your arms or legs lose feeling (numbness).  You feel sick to your stomach (nauseous) or throw up (vomit).  You have belly (abdominal) pain.  You feel like you may pass out (faint). This information is not intended to replace advice given to you by your health care provider. Make sure you discuss any questions you have with your health care provider. Document Released: 08/22/2007 Document Revised: 08/11/2015 Document Reviewed: 07/07/2013 Elsevier Interactive Patient Education  [employer].

## 2017-02-03 NOTE — Assessment & Plan Note (Signed)
Try Lidocaine and tizanidine and referred to ortho for further consideration

## 2017-02-03 NOTE — Assessment & Plan Note (Signed)
minimize simple carbs. Increase exercise as tolerated.  

## 2017-02-03 NOTE — Assessment & Plan Note (Signed)
Encouraged heart healthy diet, increase exercise, avoid trans fats, consider a krill oil cap daily. Tolerating Atorvastatin 

## 2017-02-03 NOTE — Assessment & Plan Note (Signed)
Well controlled, no changes to meds. Encouraged heart healthy diet such as the DASH diet and exercise as tolerated.  °

## 2017-02-06 ENCOUNTER — Ambulatory Visit (INDEPENDENT_AMBULATORY_CARE_PROVIDER_SITE_OTHER): Payer: Self-pay

## 2017-02-06 ENCOUNTER — Ambulatory Visit (INDEPENDENT_AMBULATORY_CARE_PROVIDER_SITE_OTHER): Payer: 59 | Admitting: Orthopedic Surgery

## 2017-02-06 ENCOUNTER — Encounter (INDEPENDENT_AMBULATORY_CARE_PROVIDER_SITE_OTHER): Payer: Self-pay | Admitting: Orthopedic Surgery

## 2017-02-06 DIAGNOSIS — G8929 Other chronic pain: Secondary | ICD-10-CM

## 2017-02-06 DIAGNOSIS — M5441 Lumbago with sciatica, right side: Secondary | ICD-10-CM | POA: Diagnosis not present

## 2017-02-06 DIAGNOSIS — M25512 Pain in left shoulder: Secondary | ICD-10-CM | POA: Diagnosis not present

## 2017-02-06 DIAGNOSIS — M5442 Lumbago with sciatica, left side: Secondary | ICD-10-CM

## 2017-02-06 MED ORDER — METHYLPREDNISOLONE 4 MG PO TABS: ORAL_TABLET | ORAL | 0 refills | Status: DC

## 2017-02-06 MED ORDER — METHOCARBAMOL 500 MG PO TABS: 500.0000 mg | ORAL_TABLET | Freq: Three times a day (TID) | ORAL | 0 refills | Status: DC

## 2017-02-06 MED ORDER — TRAMADOL HCL 50 MG PO TABS: 50.0000 mg | ORAL_TABLET | Freq: Three times a day (TID) | ORAL | 0 refills | Status: DC | PRN

## 2017-02-06 NOTE — Progress Notes (Signed)
Office Visit Note   Patient: Ruth Bell           Date of Birth: 06-29-1960           MRN: [id number] Visit Date: 02/06/2017 Requested by: Mosie Lukes, MD [address] [place], [place] [address] PCP: Mosie Lukes, MD  Subjective: Chief Complaint  Patient presents with  . Neck - Pain  . Lower Back - Pain    HPI: Anniah is a 56 year old patient with left shoulder pain and neck pain and back pain.  She reports primarily low back pain which is fairly incapacitating.  Been going on for about 4 weeks.  She denies any history of injury.  She's been taking ties and again for the problem.  She is out of work because of seizures.  She reports some chills but no discrete fevers.              ROS: All systems reviewed are negative as they relate to the chief complaint within the history of present illness.  Patient denies  fevers or chills.   Assessment & Plan: Visit Diagnoses:  1. Chronic midline low back pain with bilateral sciatica   2. Acute pain of left shoulder     Plan: Impression is low back pain with pretty significant radicular component and left hip flexor weakness.  Plan for that is Medrol Dosepak for 6 days with Robaxin all trim and MRI lumbar spine to evaluate for left-sided H&P.  She's really behaving in a manner with her fairly intractable pain that I think she does have a large disc.  Definitely positive nerve retention signs on both sides.  Some paresthesias on the left as well.  In regards to the shoulder she's had previous right shoulder surgery and she may have something on on in the left shoulder but examination today demonstrates reasonable rotator cuff strength but possibility of labral or small rotator cuff tear is possible.  Follow-Up Instructions: Return for after MRI.   Orders:  Orders Placed This Encounter  Procedures  . XR Lumbar Spine 2-3 Views  . XR Shoulder Left  . MR Lumbar Spine w/o contrast   Meds ordered this encounter    Medications  . traMADol (ULTRAM) 50 MG tablet    Sig: Take 1 tablet (50 mg total) by mouth every 8 (eight) hours as needed.    Dispense:  45 tablet    Refill:  0  . methylPREDNISolone (MEDROL) 4 MG tablet    Sig: TAKE 6 DAY DOSE PAK AS DIRECTED    Dispense:  21 tablet    Refill:  0  . methocarbamol (ROBAXIN) 500 MG tablet    Sig: Take 1 tablet (500 mg total) by mouth 3 (three) times daily.    Dispense:  30 tablet    Refill:  0      Procedures: No procedures performed   Clinical Data: No additional findings.  Objective: Vital Signs: LMP  (LMP Unknown)   Physical Exam:   Constitutional: Patient appears well-developed HEENT:  Head: Normocephalic Eyes:EOM are normal Neck: Normal range of motion Cardiovascular: Normal rate Pulmonary/chest: Effort normal Neurologic: Patient is alert Skin: Skin is warm Psychiatric: Patient has normal mood and affect    Ortho Exam: Orthopedic exam demonstrates some pain with passive motion of the left shoulder.  I will detect any coarseness or grinding.  The temperature is symmetric left shoulder versus right shoulder.  No acromioclavicular joint tenderness.  O'Brien's testing  equivocal on the left negative on the right.  Patient has pain with any type of forward flexion or abduction greater than 90.   Examination of the back demonstrates pretty significant pain with sitting to the point where she has to shift her weight between each buttock.  She had nerve nerve root tension signs bilaterally.  Ankle dorsi flexion plantar flexion quite hamstring strength is intact but there is hip flexion weakness on the left.  No discrete groin pain with internal/external rotation of either leg.  Negative clonus and reflexes symmetric bilateral biceps patella and Achilles.  Specialty Comments:  No specialty comments available.  Imaging: Xr Lumbar Spine 2-3 Views  Result Date: 02/06/2017 AP lateral lumbar spine reviewed.  Small amount of hyperlordosis  present.  Visualized hips normal.  Sacroiliac joints intact.  Minimal facet arthritis and degenerative disc disease noted in the lumbar spine  Xr Shoulder Left  Result Date: 02/06/2017 AP lateral, outlet left shoulder reviewed shoulder is reduced.  No arthritis is present.  Visualized lung fields clear.  Acromioclavicular joint intact.  No fractures Normal left shoulder.    PMFS History: Patient Active Problem List   Diagnosis Date Noted  . Preventative health care 11/04/2016  . Hoarseness 04/15/2016  . Dysphagia 04/15/2016  . Anterior chest wall pain 04/13/2016  . Dysuria 03/04/2016  . Hematuria 03/04/2016  . Palpitations 01/30/2016  . History of shingles 01/22/2016  . Eczema 08/22/2015  . Hyperlipidemia, mixed 08/22/2015  . Hyperglycemia 01/15/2015  . Lumbar radiculopathy 11/19/2014  . Arm skin lesion, left 09/05/2014  . Noncompliance with medications 08/11/2014  . Constipation 12/27/2013  . Pain in joint, shoulder region 10/13/2013  . Shoulder pain 09/23/2013  . Numbness and tingling in right hand 09/23/2013  . Jaw pain 03/26/2013  . Left leg weakness 07/03/2012  . Neck pain 05/17/2012  . Seizure disorder ([place]) 02/21/2012  . Non compliance w medication regimen 02/21/2012  . Essential hypertension 09/06/2011  . Abdominal pain 06/20/2011  . Hemorrhoids 06/20/2011  . Hx of adenomatous colonic polyps 06/13/2011  . Musculoskeletal pain 04/23/2011  . Radiculopathy of leg 02/02/2011  . History of pseudoseizure 07/10/2010  . Anxiety and depression 12/29/2009  . TINEA PEDIS 06/02/2009  . Low back pain with left-sided sciatica 09/27/2008  . Exertional dyspnea 01/13/2008  . MICROSCOPIC HEMATURIA 06/24/2007  . WEIGHT LOSS 04/28/2007  . Anemia 03/10/2007  . Migraine 03/10/2007  . ARTHRITIS 03/10/2007  . SYNCOPE 03/10/2007   Past Medical History:  Diagnosis Date  . Anemia   . Anxiety   . Arm skin lesion, left 09/05/2014  . Constipation 12/27/2013  . Depression   .  Dysphagia 04/15/2016  . Eczema 08/22/2015  . GERD (gastroesophageal reflux disease)   . Hematuria 03/04/2016  . History of shingles 01/22/2016  . Hyperglycemia 01/15/2015  . Hyperlipidemia, mixed 08/22/2015  . Hypertension   . Menometrorrhagia 2006  . Migraine, unspecified, without mention of intractable migraine without mention of status migrainosus   . Preventative health care 11/04/2016  . Pseudoseizures   . Seizures ([place])    history of pseudoseizure disorder, last last seizure 3 wks, keppra inc in dosage  . Tubular adenoma of colon 05/2011  . Uterine leiomyoma 2006    Family History  Problem Relation Age of Onset  . Heart disease Mother   . Heart attack Mother   . Cancer Father        colon  . Hypertension Father   . Cancer Sister  brain  . Cancer Brother   . Hypertension Sister   . Cancer Sister        unsure of type  . Diabetes Sister        type 2  . Heart attack Sister   . Hypertension Brother   . Colon cancer Neg Hx     Past Surgical History:  Procedure Laterality Date  . ABDOMINAL HYSTERECTOMY  04/06/04  . APPENDECTOMY    . BILATERAL SALPINGOOPHORECTOMY  04/06/04  . MASS EXCISION Left 06/02/2015   Procedure: EXCISION LEFT ARM MASS;  Surgeon: Autumn Messing III, MD;  Location: [facility];  Service: General;  Laterality: Left;  . ROTATOR CUFF REPAIR     Social History   Occupational History  . Occupation: CMA-retired  Tobacco Use  . Smoking status: Former Smoker    Packs/day: 0.50    Years: 40.00    Pack years: 20.00    Types: Cigarettes    Start date: 12/18/2015  . Smokeless tobacco: Never Used  . Tobacco comment: 3-4 cigarettes daily  Substance and Sexual Activity  . Alcohol use: No  . Drug use: No  . Sexual activity: Not on file    Comment: lives with husband, no dietary restrictions.

## 2017-02-08 ENCOUNTER — Other Ambulatory Visit: Payer: Self-pay | Admitting: Family Medicine

## 2017-02-21 ENCOUNTER — Other Ambulatory Visit: Payer: Self-pay | Admitting: Family Medicine

## 2017-02-22 ENCOUNTER — Ambulatory Visit
Admission: RE | Admit: 2017-02-22 | Discharge: 2017-02-22 | Disposition: A | Discharge status: Home / Self Care | Payer: 59 | Source: Ambulatory Visit | Attending: Orthopedic Surgery | Admitting: Orthopedic Surgery

## 2017-02-22 DIAGNOSIS — M5441 Lumbago with sciatica, right side: Principal | ICD-10-CM

## 2017-02-22 DIAGNOSIS — G8929 Other chronic pain: Secondary | ICD-10-CM

## 2017-02-22 DIAGNOSIS — M5442 Lumbago with sciatica, left side: Principal | ICD-10-CM

## 2017-02-26 ENCOUNTER — Ambulatory Visit: Payer: 59

## 2017-02-28 ENCOUNTER — Ambulatory Visit (INDEPENDENT_AMBULATORY_CARE_PROVIDER_SITE_OTHER): Payer: 59 | Admitting: Family Medicine

## 2017-02-28 ENCOUNTER — Other Ambulatory Visit: Payer: 59

## 2017-02-28 VITALS — BP 135/79 | HR 92 | Temp 98.2°F | Ht 62.0 in | Wt 125.0 lb

## 2017-02-28 DIAGNOSIS — R42 Dizziness and giddiness: Secondary | ICD-10-CM

## 2017-02-28 DIAGNOSIS — R000 Tachycardia, unspecified: Secondary | ICD-10-CM | POA: Diagnosis not present

## 2017-02-28 DIAGNOSIS — I10 Essential (primary) hypertension: Secondary | ICD-10-CM | POA: Diagnosis not present

## 2017-02-28 DIAGNOSIS — M5442 Lumbago with sciatica, left side: Secondary | ICD-10-CM

## 2017-02-28 DIAGNOSIS — R739 Hyperglycemia, unspecified: Secondary | ICD-10-CM | POA: Diagnosis not present

## 2017-02-28 DIAGNOSIS — R002 Palpitations: Secondary | ICD-10-CM | POA: Diagnosis not present

## 2017-02-28 MED ORDER — RANITIDINE HCL 150 MG PO TABS: 150.0000 mg | ORAL_TABLET | Freq: Two times a day (BID) | ORAL | 2 refills | Status: DC

## 2017-02-28 MED ORDER — AMLODIPINE BESYLATE 2.5 MG PO TABS: 2.5000 mg | ORAL_TABLET | Freq: Two times a day (BID) | ORAL | 3 refills | Status: DC

## 2017-02-28 NOTE — Assessment & Plan Note (Signed)
hgba1c acceptable, minimize simple carbs. Increase exercise as tolerated.  

## 2017-02-28 NOTE — Assessment & Plan Note (Signed)
bp improved but still mildly elevated and she is tachycardic at visit so will increase the Amlodipine to 2.5 mg po bid and reassess in 3-4 weeks.

## 2017-02-28 NOTE — Assessment & Plan Note (Addendum)
Has had a recent MRI and is awaiting definitive treatment with orthopaedics try Lidocaine patches prn

## 2017-02-28 NOTE — Assessment & Plan Note (Signed)
Improved some on recheck. Will increase Amloidpine to 2.5 mg bid and reassess at next visit

## 2017-02-28 NOTE — Assessment & Plan Note (Signed)
No recent flares 

## 2017-02-28 NOTE — Progress Notes (Signed)
Pre visit review using our clinic tool,if applicable. No additional management support is needed unless otherwise documented below in the visit note.  . Patient in for BP check per order from Dr. Charlett Blake.  Patient complains of dizziness this visit. States she had chest pain last Thursday. Can't stad or sit long because of her back, SOB when pain hits in back. States she has had numbness in fingers and legs. Tenderness in area of upper left chest.   BP today = 135/79 P = 118  Per Dr. Charlett Blake patient to have pulse checked again in 10 minutes.   Pulse ranging from 106-115

## 2017-02-28 NOTE — Progress Notes (Signed)
Subjective:  I acted as a Education administrator for Dr. Charlett Blake. [name], [place]  Patient ID: Ruth Bell, female    DOB: Jul 19, 1960, 56 y.o.   MRN: [id number]  No chief complaint on file.   HPI  Patient is in today for a blood pressure check. Patient c/o dizzy spells and a general sense of malaise.  No acute febrile illness or specific complaints.  Continues to struggle with chronic low back pain and recently had an MRI.  Awaits reevaluation by orthopedics now that the MRI is completed.  No worsening symptoms, incontinence or urgency noted.  No recent falls or trauma. Denies CP/palp/SOB/HA/congestion/fevers/GI or GU c/o. Taking meds as prescribed  Patient Care Team: Mosie Lukes, MD as PCP - General (Family Medicine)   Past Medical History:  Diagnosis Date  . Anemia   . Anxiety   . Arm skin lesion, left 09/05/2014  . Constipation 12/27/2013  . Depression   . Dysphagia 04/15/2016  . Eczema 08/22/2015  . GERD (gastroesophageal reflux disease)   . Hematuria 03/04/2016  . History of shingles 01/22/2016  . Hyperglycemia 01/15/2015  . Hyperlipidemia, mixed 08/22/2015  . Hypertension   . Menometrorrhagia 2006  . Migraine, unspecified, without mention of intractable migraine without mention of status migrainosus   . Preventative health care 11/04/2016  . Pseudoseizures   . Seizures ([place])    history of pseudoseizure disorder, last last seizure 3 wks, keppra inc in dosage  . Tubular adenoma of colon 05/2011  . Uterine leiomyoma 2006    Past Surgical History:  Procedure Laterality Date  . ABDOMINAL HYSTERECTOMY  04/06/04  . APPENDECTOMY    . BILATERAL SALPINGOOPHORECTOMY  04/06/04  . MASS EXCISION Left 06/02/2015   Procedure: EXCISION LEFT ARM MASS;  Surgeon: Autumn Messing III, MD;  Location: [facility];  Service: General;  Laterality: Left;  . ROTATOR CUFF REPAIR      Family History  Problem Relation Age of Onset  . Heart disease Mother   . Heart attack Mother   . Cancer Father    colon  . Hypertension Father   . Cancer Sister        brain  . Cancer Brother   . Hypertension Sister   . Cancer Sister        unsure of type  . Diabetes Sister        type 2  . Heart attack Sister   . Hypertension Brother   . Colon cancer Neg Hx     Social History   Socioeconomic History  . Marital status: Married    Spouse name: Gwyndolyn Saxon  . Number of children: 2  . Years of education: Not on file  . Highest education level: Not on file  Social Needs  . Financial resource strain: Not on file  . Food insecurity - worry: Not on file  . Food insecurity - inability: Not on file  . Transportation needs - medical: Not on file  . Transportation needs - non-medical: Not on file  Occupational History  . Occupation: CMA-retired  Tobacco Use  . Smoking status: Former Smoker    Packs/day: 0.50    Years: 40.00    Pack years: 20.00    Types: Cigarettes    Start date: 12/18/2015  . Smokeless tobacco: Never Used  . Tobacco comment: 3-4 cigarettes daily  Substance and Sexual Activity  . Alcohol use: No  . Drug use: No  . Sexual activity: Not on file    Comment: lives  with husband, no dietary restrictions.   Other Topics Concern  . Not on file  Social History Narrative   Lives with her husband and their son.   Daughter lives in [place], [place].    Outpatient Medications Prior to Visit  Medication Sig Dispense Refill  . atorvastatin (LIPITOR) 10 MG tablet TAKE 1 TABLET BY MOUTH  DAILY 90 tablet 1  . Azelastine HCl 0.15 % SOLN Place 2 sprays into both nostrils 2 (two) times daily. 30 mL 0  . baclofen (LIORESAL) 10 MG tablet TAKE 1/2 TABLET BY MOUTH 3 TIMES A DAY  4  . benzonatate (TESSALON) 100 MG capsule TAKE 1-2 CAPSULES BY MOUTH  3 TIMES DAILY AS NEEDED FOR COUGH. 40 capsule 0  . busPIRone (BUSPAR) 7.5 MG tablet Take 1 tablet (7.5 mg total) 3 (three) times daily by mouth. 90 tablet 1  . citalopram (CELEXA) 20 MG tablet Take 1 tablet (20 mg total) by mouth daily. 90 tablet 1  .  cyclobenzaprine (FLEXERIL) 10 MG tablet Take 1 tablet (10 mg total) by mouth 3 (three) times daily as needed for muscle spasms. 90 tablet 1  . famciclovir (FAMVIR) 500 MG tablet Take 1 tablet (500 mg total) by mouth 3 (three) times daily. 21 tablet 0  . gabapentin (NEURONTIN) 600 MG tablet Take 1 tablet (600 mg total) by mouth 3 (three) times daily. Reported on 05/26/2015 90 tablet 1  . levETIRAcetam (KEPPRA) 500 MG tablet TAKE 1 TABLET (500 MG TOTAL) BY MOUTH TWO (2) TIMES A DAY.  3  . methocarbamol (ROBAXIN) 500 MG tablet Take 1 tablet (500 mg total) by mouth 3 (three) times daily. 30 tablet 0  . methylPREDNISolone (MEDROL) 4 MG tablet TAKE 6 DAY DOSE PAK AS DIRECTED 21 tablet 0  . metoprolol tartrate (LOPRESSOR) 50 MG tablet TAKE 1 TABLET BY MOUTH TWO  TIMES DAILY 120 tablet 4  . metoprolol tartrate 75 MG TABS Take 75 mg by mouth 2 (two) times daily. 180 tablet 3  . ondansetron (ZOFRAN ODT) 4 MG disintegrating tablet Take 1 tablet (4 mg total) by mouth every 8 (eight) hours as needed for nausea or vomiting. 20 tablet 0  . pantoprazole (PROTONIX) 40 MG tablet Take 1 tablet (40 mg total) by mouth 2 (two) times daily. TAKE 1 TABLET (40 MG TOTAL) BY MOUTH DAILY. 180 tablet 2  . QUEtiapine (SEROQUEL) 300 MG tablet Take 1 tablet (300 mg total) by mouth at bedtime. 90 tablet 1  . tiZANidine (ZANAFLEX) 4 MG tablet Take 4 mg by mouth every 8 (eight) hours as needed for muscle spasms.    . traMADol (ULTRAM) 50 MG tablet Take 1 tablet (50 mg total) by mouth every 8 (eight) hours as needed. 45 tablet 0  . amLODipine (NORVASC) 2.5 MG tablet Take 1 tablet (2.5 mg total) daily by mouth. 30 tablet 3  . Guaifenesin (MUCINEX MAXIMUM STRENGTH) 1200 MG TB12 Take 1 tablet (1,200 mg total) by mouth every 12 (twelve) hours as needed. 14 tablet 1  . pantoprazole (PROTONIX) 40 MG tablet TAKE 1 TABLET BY MOUTH  DAILY 90 tablet 2  . ranitidine (ZANTAC) 150 MG tablet Take 1 tablet (150 mg total) by mouth 2 (two) times daily.  180 tablet 2   No facility-administered medications prior to visit.     No Known Allergies  Review of Systems  Constitutional: Positive for malaise/fatigue. Negative for fever.  HENT: Negative for congestion.   Eyes: Negative for blurred vision.  Respiratory: Negative for shortness of  breath.   Cardiovascular: Negative for chest pain, palpitations and leg swelling.  Gastrointestinal: Negative for abdominal pain, blood in stool and nausea.  Genitourinary: Negative for dysuria and frequency.  Musculoskeletal: Positive for back pain and myalgias. Negative for falls.  Skin: Negative for rash.  Neurological: Positive for dizziness. Negative for loss of consciousness and headaches.  Endo/Heme/Allergies: Negative for environmental allergies.  Psychiatric/Behavioral: Negative for depression. The patient is not nervous/anxious.        Objective:    Physical Exam  Constitutional: She is oriented to person, place, and time. She appears well-developed and well-nourished. No distress.  HENT:  Head: Normocephalic and atraumatic.  Nose: Nose normal.  Eyes: Right eye exhibits no discharge. Left eye exhibits no discharge.  Neck: Normal range of motion. Neck supple.  Cardiovascular: Regular rhythm.  No murmur heard. Mild tachcardia  Pulmonary/Chest: Effort normal and breath sounds normal.  Abdominal: Soft. Bowel sounds are normal. There is no tenderness.  Musculoskeletal: She exhibits no edema.  Neurological: She is alert and oriented to person, place, and time.  Skin: Skin is warm and dry.  Psychiatric: She has a normal mood and affect.  Nursing note and vitals reviewed.   BP 135/79   Pulse 92   Temp 98.2 F (36.8 C)   Ht 5\' 2"  (1.575 m)   Wt 125 lb (56.7 kg)   LMP  (LMP Unknown)   SpO2 98%   BMI 22.86 kg/m  Wt Readings from Last 3 Encounters:  02/28/17 125 lb (56.7 kg)  01/29/17 125 lb 3.2 oz (56.8 kg)  01/18/17 122 lb 12.8 oz (55.7 kg)   BP Readings from Last 3 Encounters:   02/28/17 135/79  02/03/17 (!) 142/80  01/18/17 (!) 160/88     Immunization History  Administered Date(s) Administered  . Influenza Split 03/06/2011, 02/29/2012  . Pneumococcal Polysaccharide-23 01/09/2008, 02/23/2012  . Td 04/28/2007    Health Maintenance  Topic Date Due  . INFLUENZA VACCINE  04/15/2017 (Originally 10/17/2016)  . TETANUS/TDAP  04/27/2017  . MAMMOGRAM  05/29/2017  . COLONOSCOPY  07/26/2020  . Hepatitis C Screening  Completed  . HIV Screening  Completed    Lab Results  Component Value Date   WBC 8.9 01/04/2017   HGB 12.5 01/04/2017   HCT 38.7 01/04/2017   PLT 329.0 01/04/2017   GLUCOSE 92 01/04/2017   CHOL 157 11/06/2016   TRIG 67.0 11/06/2016   HDL 61.60 11/06/2016   LDLCALC 82 11/06/2016   ALT 20 01/04/2017   AST 20 01/04/2017   NA 142 01/04/2017   K 4.0 01/04/2017   CL 104 01/04/2017   CREATININE 0.70 01/04/2017   BUN 13 01/04/2017   CO2 32 01/04/2017   TSH 0.63 10/02/2016   INR 1.0 11/19/2013   HGBA1C 6.2 10/02/2016    Lab Results  Component Value Date   TSH 0.63 10/02/2016   Lab Results  Component Value Date   WBC 8.9 01/04/2017   HGB 12.5 01/04/2017   HCT 38.7 01/04/2017   MCV 85.7 01/04/2017   PLT 329.0 01/04/2017   Lab Results  Component Value Date   NA 142 01/04/2017   K 4.0 01/04/2017   CO2 32 01/04/2017   GLUCOSE 92 01/04/2017   BUN 13 01/04/2017   CREATININE 0.70 01/04/2017   BILITOT 0.3 01/04/2017   ALKPHOS 53 01/04/2017   AST 20 01/04/2017   ALT 20 01/04/2017   PROT 7.4 01/04/2017   ALBUMIN 4.3 01/04/2017   CALCIUM 9.8 01/04/2017   ANIONGAP  7 01/10/2016   GFR 110.42 01/04/2017   Lab Results  Component Value Date   CHOL 157 11/06/2016   Lab Results  Component Value Date   HDL 61.60 11/06/2016   Lab Results  Component Value Date   LDLCALC 82 11/06/2016   Lab Results  Component Value Date   TRIG 67.0 11/06/2016   Lab Results  Component Value Date   CHOLHDL 3 11/06/2016   Lab Results  Component  Value Date   HGBA1C 6.2 10/02/2016         Assessment & Plan:   Problem List Items Addressed This Visit    Essential hypertension (Chronic)    bp improved but still mildly elevated and she is tachycardic at visit so will increase the Amlodipine to 2.5 mg po bid and reassess in 3-4 weeks.       Relevant Medications   amLODipine (NORVASC) 2.5 MG tablet   Palpitations (Chronic)    No recent flares.       Low back pain with left-sided sciatica    Has had a recent MRI and is awaiting definitive treatment with orthopaedics try Lidocaine patches prn      Hyperglycemia    hgba1c acceptable, minimize simple carbs. Increase exercise as tolerated.       Tachycardia    Improved some on recheck. Will increase Amloidpine to 2.5 mg bid and reassess at next visit       Other Visit Diagnoses    Dizzy    -  Primary   Relevant Orders   EKG 12-Lead (Completed)   Heart palpitations       Relevant Orders   EKG 12-Lead (Completed)      I have discontinued Oletta Cohn. [place]'s Guaifenesin. I have also changed her amLODipine. Additionally, I am having her maintain her cyclobenzaprine, levETIRAcetam, Azelastine HCl, gabapentin, ondansetron, famciclovir, benzonatate, pantoprazole, baclofen, tiZANidine, QUEtiapine, citalopram, Metoprolol Tartrate, atorvastatin, busPIRone, traMADol, methylPREDNISolone, methocarbamol, metoprolol tartrate, and ranitidine.  Meds ordered this encounter  Medications  . amLODipine (NORVASC) 2.5 MG tablet    Sig: Take 1 tablet (2.5 mg total) by mouth 2 (two) times daily.    Dispense:  60 tablet    Refill:  3  . ranitidine (ZANTAC) 150 MG tablet    Sig: Take 1 tablet (150 mg total) by mouth 2 (two) times daily.    Dispense:  180 tablet    Refill:  2    CMA served as scribe during this visit. History, Physical and Plan performed by medical provider. Documentation and orders reviewed and attested to.  [name], MD

## 2017-02-28 NOTE — Patient Instructions (Signed)
Lidocaine gel or patch by [employer], [facility] [place], [place] or at [facility] Sinus Tachycardia Sinus tachycardia is a kind of fast heartbeat. In sinus tachycardia, the heart beats more than 100 times a minute. Sinus tachycardia starts in a part of the heart called the sinus node. Sinus tachycardia may be harmless, or it may be a sign of a serious condition. What are the causes? This condition may be caused by:  Exercise or exertion.  A fever.  Pain.  Loss of body fluids (dehydration).  Severe bleeding (hemorrhage).  Anxiety and stress.  Certain substances, including: ? Alcohol. ? Caffeine. ? Tobacco and nicotine products. ? Diet pills. ? Illegal drugs.  Medical conditions including: ? Heart disease. ? An infection. ? An overactive thyroid (hyperthyroidism). ? A lack of red blood cells (anemia).  What are the signs or symptoms? Symptoms of this condition include:  A feeling that the heart is beating quickly (palpitations).  Suddenly noticing your heartbeat (cardiac awareness).  Dizziness.  Tiredness (fatigue).  Shortness of breath.  Chest pain.  Nausea.  Fainting.  How is this diagnosed? This condition is diagnosed with:  A physical exam.  Other tests, such as: ? Blood tests. ? An electrocardiogram (ECG). This test measures the electrical activity of the heart. ? Holter monitoring. For this test, you wear a device that records your heartbeat for one or more days.  You may be referred to a heart specialist (cardiologist). How is this treated? Treatment for this condition depends on the cause or underlying condition. Treatment may involve:  Treating the underlying condition.  Taking new medicines or changing your current medicines as told by your health care provider.  Making changes to your diet or lifestyle.  Practicing relaxation methods.  Follow these instructions at home: Lifestyle  Do not use any products that contain nicotine or tobacco,  such as cigarettes and e-cigarettes. If you need help quitting, ask your health care provider.  Learn relaxation methods, like deep breathing, to help you when you get stressed or anxious.  Do not use illegal drugs, such as cocaine.  Do not abuse alcohol. Limit alcohol intake to no more than 1 drink a day for non-pregnant women and 2 drinks a day for men. One drink equals 12 oz of beer, 5 oz of wine, or 1 oz of hard liquor.  Find time to rest and relax often. This reduces stress.  Avoid: ? Caffeine. ? Stimulants such as over-the-counter diet pills or pills that help you to stay awake. ? Situations that cause anxiety or stress. General instructions  Drink enough fluids to keep your urine clear or pale yellow.  Take over-the-counter and prescription medicines only as told by your health care provider.  Keep all follow-up visits as told by your health care provider. This is important. Contact a health care provider if:  You have a fever.  You have vomiting or diarrhea that keeps happening (is persistent). Get help right away if:  You have pain in your chest, upper arms, jaw, or neck.  You become weak or dizzy.  You feel faint.  You have palpitations that do not go away. This information is not intended to replace advice given to you by your health care provider. Make sure you discuss any questions you have with your health care provider. Document Released: 04/12/2004 Document Revised: 10/01/2015 Document Reviewed: 09/17/2014 Elsevier Interactive Patient Education  [employer].

## 2017-03-14 ENCOUNTER — Telehealth: Payer: Self-pay | Admitting: Family Medicine

## 2017-03-14 NOTE — Telephone Encounter (Signed)
Patient came in today stated that provider asked her to come in for BP Check only (written on AVS) advised patient that she needed appt on Nurse schedule. Patient is scheduled for 03/15/17 with Santiago Glad for 49min BP check. Please place orders per nurse Santiago Glad for BP. Thank you

## 2017-03-14 NOTE — Telephone Encounter (Signed)
If note on AVS okay to have BP checked by nurse.

## 2017-03-15 ENCOUNTER — Other Ambulatory Visit: Payer: Self-pay

## 2017-03-15 ENCOUNTER — Ambulatory Visit (INDEPENDENT_AMBULATORY_CARE_PROVIDER_SITE_OTHER): Payer: 59 | Admitting: Family Medicine

## 2017-03-15 DIAGNOSIS — I10 Essential (primary) hypertension: Secondary | ICD-10-CM | POA: Diagnosis not present

## 2017-03-15 MED ORDER — TIZANIDINE HCL 4 MG PO TABS: 4.0000 mg | ORAL_TABLET | Freq: Three times a day (TID) | ORAL | 0 refills | Status: DC | PRN

## 2017-03-15 NOTE — Progress Notes (Signed)
Pre visit review using our clinic tool,if applicable. No additional management support is needed unless otherwise documented below in the visit note.   Patient in for BP check per order from Dr. Charlett Blake dated 03/14/17.  Patient complains of Dizziness continuing. Dizziness continues.BP medication taken today.  BP today = 130/82 P=75  Per Dr. Nani Ravens continue taking medications as ordered. Keep appointment with Dr. Charlett Blake scheduled for 03/21/17.

## 2017-03-15 NOTE — Progress Notes (Signed)
Noted. Agree with above. F/u with Dr. Charlett Blake for dizziness.  [place], DO 03/15/17 2:48 PM

## 2017-03-21 ENCOUNTER — Ambulatory Visit (INDEPENDENT_AMBULATORY_CARE_PROVIDER_SITE_OTHER): Payer: 59 | Admitting: Family Medicine

## 2017-03-21 ENCOUNTER — Encounter: Payer: Self-pay | Admitting: Family Medicine

## 2017-03-21 DIAGNOSIS — M546 Pain in thoracic spine: Secondary | ICD-10-CM

## 2017-03-21 DIAGNOSIS — E782 Mixed hyperlipidemia: Secondary | ICD-10-CM | POA: Diagnosis not present

## 2017-03-21 DIAGNOSIS — R739 Hyperglycemia, unspecified: Secondary | ICD-10-CM

## 2017-03-21 DIAGNOSIS — R000 Tachycardia, unspecified: Secondary | ICD-10-CM | POA: Diagnosis not present

## 2017-03-21 DIAGNOSIS — I10 Essential (primary) hypertension: Secondary | ICD-10-CM

## 2017-03-21 DIAGNOSIS — M549 Dorsalgia, unspecified: Secondary | ICD-10-CM | POA: Insufficient documentation

## 2017-03-21 LAB — LIPID PANEL
Cholesterol: 184 mg/dL (ref 0–200)
HDL: 64.5 mg/dL (ref >39.00)
LDL Cholesterol: 96 mg/dL (ref 0–99)
NonHDL: 119.64
Total CHOL/HDL Ratio: 3
Triglycerides: 119 mg/dL (ref 0.0–149.0)
VLDL: 23.8 mg/dL (ref 0.0–40.0)

## 2017-03-21 LAB — COMPREHENSIVE METABOLIC PANEL
ALT: 34 U/L (ref 0–35)
AST: 25 U/L (ref 0–37)
Albumin: 4.5 g/dL (ref 3.5–5.2)
Alkaline Phosphatase: 65 U/L (ref 39–117)
BUN: 14 mg/dL (ref 6–23)
CO2: 25 mEq/L (ref 19–32)
Calcium: 9.1 mg/dL (ref 8.4–10.5)
Chloride: 103 mEq/L (ref 96–112)
Creatinine, Ser: 0.57 mg/dL (ref 0.40–1.20)
GFR: 139.86 mL/min (ref >60.00)
Glucose, Bld: 101 mg/dL — ABNORMAL HIGH (ref 70–99)
Potassium: 4 mEq/L (ref 3.5–5.1)
Sodium: 137 mEq/L (ref 135–145)
Total Bilirubin: 0.6 mg/dL (ref 0.2–1.2)
Total Protein: 7.8 g/dL (ref 6.0–8.3)

## 2017-03-21 LAB — CBC
HCT: 41.4 % (ref 36.0–46.0)
Hemoglobin: 13.3 g/dL (ref 12.0–15.0)
MCHC: 32.1 g/dL (ref 30.0–36.0)
MCV: 84.7 fl (ref 78.0–100.0)
Platelets: 303 10*3/uL (ref 150.0–400.0)
RBC: 4.88 Mil/uL (ref 3.87–5.11)
RDW: 14.8 % (ref 11.5–15.5)
WBC: 15.3 10*3/uL — ABNORMAL HIGH (ref 4.0–10.5)

## 2017-03-21 LAB — HEMOGLOBIN A1C: Hgb A1c MFr Bld: 6.7 % — ABNORMAL HIGH (ref 4.6–6.5)

## 2017-03-21 LAB — TSH: TSH: 0.78 u[IU]/mL (ref 0.35–4.50)

## 2017-03-21 NOTE — Assessment & Plan Note (Signed)
Well controlled, no changes to meds. Encouraged heart healthy diet such as the DASH diet and exercise as tolerated.  °

## 2017-03-21 NOTE — Patient Instructions (Addendum)
64 oz of clear fluids, do not stand up quickly let us know if symptoms worsen.   Lidocaine gel by Jenel Lucks, Aspercreme, [facility] for muscle spasm and pain  Shingrix is the new shingles shot, 2 shots over 2-6 months at pharmacy or here, check with insurance to confirm coverage Hypertension Hypertension is another name for high blood pressure. High blood pressure forces your heart to work harder to pump blood. This can cause problems over time. There are two numbers in a blood pressure reading. There is a top number (systolic) over a bottom number (diastolic). It is best to have a blood pressure below 120/80. Healthy choices can help lower your blood pressure. You may need medicine to help lower your blood pressure if:  Your blood pressure cannot be lowered with healthy choices.  Your blood pressure is higher than 130/80.  Follow these instructions at home: Eating and drinking  If directed, follow the DASH eating plan. This diet includes: ? Filling half of your plate at each meal with fruits and vegetables. ? Filling one quarter of your plate at each meal with whole grains. Whole grains include whole wheat pasta, brown rice, and whole grain bread. ? Eating or drinking low-fat dairy products, such as skim milk or low-fat yogurt. ? Filling one quarter of your plate at each meal with low-fat (lean) proteins. Low-fat proteins include fish, skinless chicken, eggs, beans, and tofu. ? Avoiding fatty meat, cured and processed meat, or chicken with skin. ? Avoiding premade or processed food.  Eat less than 1,500 mg of salt (sodium) a day.  Limit alcohol use to no more than 1 drink a day for nonpregnant women and 2 drinks a day for men. One drink equals 12 oz of beer, 5 oz of wine, or 1 oz of hard liquor. Lifestyle  Work with your doctor to stay at a healthy weight or to lose weight. Ask your doctor what the best weight is for you.  Get at least 30 minutes of exercise that causes your heart to  beat faster (aerobic exercise) most days of the week. This may include walking, swimming, or biking.  Get at least 30 minutes of exercise that strengthens your muscles (resistance exercise) at least 3 days a week. This may include lifting weights or pilates.  Do not use any products that contain nicotine or tobacco. This includes cigarettes and e-cigarettes. If you need help quitting, ask your doctor.  Check your blood pressure at home as told by your doctor.  Keep all follow-up visits as told by your doctor. This is important. Medicines  Take over-the-counter and prescription medicines only as told by your doctor. Follow directions carefully.  Do not skip doses of blood pressure medicine. The medicine does not work as well if you skip doses. Skipping doses also puts you at risk for problems.  Ask your doctor about side effects or reactions to medicines that you should watch for. Contact a doctor if:  You think you are having a reaction to the medicine you are taking.  You have headaches that keep coming back (recurring).  You feel dizzy.  You have swelling in your ankles.  You have trouble with your vision. Get help right away if:  You get a very bad headache.  You start to feel confused.  You feel weak or numb.  You feel faint.  You get very bad pain in your: ? Chest. ? Belly (abdomen).  You throw up (vomit) more than once.  You  have trouble breathing. Summary  Hypertension is another name for high blood pressure.  Making healthy choices can help lower blood pressure. If your blood pressure cannot be controlled with healthy choices, you may need to take medicine. This information is not intended to replace advice given to you by your health care provider. Make sure you discuss any questions you have with your health care provider. Document Released: 08/22/2007 Document Revised: 02/01/2016 Document Reviewed: 02/01/2016 Elsevier Interactive Patient Education  [employer].

## 2017-03-21 NOTE — Assessment & Plan Note (Signed)
minimize simple carbs. Increase exercise as tolerated.  

## 2017-03-21 NOTE — Progress Notes (Signed)
Subjective:  I acted as a Education administrator for [employer]. Ruth Bell, [place]   Patient ID: Ruth Bell, female    DOB: 1960-07-04, 57 y.o.   MRN: [id number]  Chief Complaint  Patient presents with  . Follow-up    HPI  Patient is in today for follow up visit and overall is feeling well.  No recent febrile illness or hospitalizations.  She has had several family members passed away recently and is stressed about that.  She has tolerated the change in her own medications.  No palpitations or chest pains.  She had a couple of mild episodes of feeling lightheaded with quick position changes but with a few minutes rest it resolves.  No other neurologic complaints or associated symptoms.  No falls or trauma. Denies SOB/HA/congestion/fevers/GI or GU c/o. Taking meds as prescribed. Does note some thoracic back pain at times.  No falls or trauma.  Spasm occurs and resolves over hours to months without any obvious trigger.  Patient Care Team: Mosie Lukes, MD as PCP - General (Family Medicine)   Past Medical History:  Diagnosis Date  . Anemia   . Anxiety   . Arm skin lesion, left 09/05/2014  . Constipation 12/27/2013  . Depression   . Dysphagia 04/15/2016  . Eczema 08/22/2015  . GERD (gastroesophageal reflux disease)   . Hematuria 03/04/2016  . History of shingles 01/22/2016  . Hyperglycemia 01/15/2015  . Hyperlipidemia, mixed 08/22/2015  . Hypertension   . Menometrorrhagia 2006  . Migraine, unspecified, without mention of intractable migraine without mention of status migrainosus   . Preventative health care 11/04/2016  . Pseudoseizures   . Seizures ([place])    history of pseudoseizure disorder, last last seizure 3 wks, keppra inc in dosage  . Tubular adenoma of colon 05/2011  . Uterine leiomyoma 2006    Past Surgical History:  Procedure Laterality Date  . ABDOMINAL HYSTERECTOMY  04/06/04  . APPENDECTOMY    . BILATERAL SALPINGOOPHORECTOMY  04/06/04  . MASS EXCISION Left 06/02/2015   Procedure: EXCISION  LEFT ARM MASS;  Surgeon: Autumn Messing III, MD;  Location: [facility];  Service: General;  Laterality: Left;  . ROTATOR CUFF REPAIR      Family History  Problem Relation Age of Onset  . Heart disease Mother   . Heart attack Mother   . Cancer Father        colon  . Hypertension Father   . Cancer Sister        brain  . Cancer Brother   . Hypertension Sister   . Cancer Sister        unsure of type  . Diabetes Sister        type 2  . Heart attack Sister   . Hypertension Brother   . Colon cancer Neg Hx     Social History   Socioeconomic History  . Marital status: Married    Spouse name: Ruth Bell  . Number of children: 2  . Years of education: Not on file  . Highest education level: Not on file  Social Needs  . Financial resource strain: Not on file  . Food insecurity - worry: Not on file  . Food insecurity - inability: Not on file  . Transportation needs - medical: Not on file  . Transportation needs - non-medical: Not on file  Occupational History  . Occupation: CMA-retired  Tobacco Use  . Smoking status: Former Smoker    Packs/day: 0.50    Years: 40.00  Pack years: 20.00    Types: Cigarettes    Start date: 12/18/2015  . Smokeless tobacco: Never Used  . Tobacco comment: 3-4 cigarettes daily  Substance and Sexual Activity  . Alcohol use: No  . Drug use: No  . Sexual activity: Not on file    Comment: lives with husband, no dietary restrictions.   Other Topics Concern  . Not on file  Social History Narrative   Lives with her husband and their son.   Daughter lives in [place], [place].    Outpatient Medications Prior to Visit  Medication Sig Dispense Refill  . amLODipine (NORVASC) 2.5 MG tablet Take 1 tablet (2.5 mg total) by mouth 2 (two) times daily. 60 tablet 3  . atorvastatin (LIPITOR) 10 MG tablet TAKE 1 TABLET BY MOUTH  DAILY 90 tablet 1  . Azelastine HCl 0.15 % SOLN Place 2 sprays into both nostrils 2 (two) times daily. 30 mL 0  . baclofen  (LIORESAL) 10 MG tablet TAKE 1/2 TABLET BY MOUTH 3 TIMES A DAY  4  . benzonatate (TESSALON) 100 MG capsule TAKE 1-2 CAPSULES BY MOUTH  3 TIMES DAILY AS NEEDED FOR COUGH. 40 capsule 0  . busPIRone (BUSPAR) 7.5 MG tablet Take 1 tablet (7.5 mg total) 3 (three) times daily by mouth. 90 tablet 1  . citalopram (CELEXA) 20 MG tablet Take 1 tablet (20 mg total) by mouth daily. 90 tablet 1  . cyclobenzaprine (FLEXERIL) 10 MG tablet Take 1 tablet (10 mg total) by mouth 3 (three) times daily as needed for muscle spasms. 90 tablet 1  . famciclovir (FAMVIR) 500 MG tablet Take 1 tablet (500 mg total) by mouth 3 (three) times daily. 21 tablet 0  . gabapentin (NEURONTIN) 600 MG tablet Take 1 tablet (600 mg total) by mouth 3 (three) times daily. Reported on 05/26/2015 90 tablet 1  . levETIRAcetam (KEPPRA) 500 MG tablet TAKE 1 TABLET (500 MG TOTAL) BY MOUTH TWO (2) TIMES A DAY.  3  . methocarbamol (ROBAXIN) 500 MG tablet Take 1 tablet (500 mg total) by mouth 3 (three) times daily. 30 tablet 0  . methylPREDNISolone (MEDROL) 4 MG tablet TAKE 6 DAY DOSE PAK AS DIRECTED 21 tablet 0  . metoprolol tartrate (LOPRESSOR) 50 MG tablet TAKE 1 TABLET BY MOUTH TWO  TIMES DAILY 120 tablet 4  . metoprolol tartrate 75 MG TABS Take 75 mg by mouth 2 (two) times daily. (Patient not taking: Reported on 03/15/2017) 180 tablet 3  . ondansetron (ZOFRAN ODT) 4 MG disintegrating tablet Take 1 tablet (4 mg total) by mouth every 8 (eight) hours as needed for nausea or vomiting. 20 tablet 0  . pantoprazole (PROTONIX) 40 MG tablet Take 1 tablet (40 mg total) by mouth 2 (two) times daily. TAKE 1 TABLET (40 MG TOTAL) BY MOUTH DAILY. 180 tablet 2  . QUEtiapine (SEROQUEL) 300 MG tablet Take 1 tablet (300 mg total) by mouth at bedtime. 90 tablet 1  . ranitidine (ZANTAC) 150 MG tablet Take 1 tablet (150 mg total) by mouth 2 (two) times daily. 180 tablet 2  . tiZANidine (ZANAFLEX) 4 MG tablet Take 1 tablet (4 mg total) by mouth every 8 (eight) hours as  needed for muscle spasms. 30 tablet 0  . traMADol (ULTRAM) 50 MG tablet Take 1 tablet (50 mg total) by mouth every 8 (eight) hours as needed. 45 tablet 0   No facility-administered medications prior to visit.     No Known Allergies  ROS  Objective:    Physical Exam  BP 130/80 (BP Location: Left Arm, Patient Position: Sitting, Cuff Size: Normal)   Pulse 82   Temp 98.2 F (36.8 C) (Oral)   Resp 18   Ht 5' 1.81" (1.57 m)   Wt 128 lb 3.2 oz (58.2 kg)   LMP  (LMP Unknown)   SpO2 97%   BMI 23.59 kg/m  Wt Readings from Last 3 Encounters:  03/21/17 128 lb 3.2 oz (58.2 kg)  02/28/17 125 lb (56.7 kg)  01/29/17 125 lb 3.2 oz (56.8 kg)   BP Readings from Last 3 Encounters:  03/21/17 130/80  02/28/17 135/79  02/03/17 (!) 142/80     Immunization History  Administered Date(s) Administered  . Influenza Split 03/06/2011, 02/29/2012  . Pneumococcal Polysaccharide-23 01/09/2008, 02/23/2012  . Td 04/28/2007    Health Maintenance  Topic Date Due  . INFLUENZA VACCINE  04/15/2017 (Originally 10/17/2016)  . TETANUS/TDAP  04/27/2017  . MAMMOGRAM  05/29/2017  . COLONOSCOPY  07/26/2020  . Hepatitis C Screening  Completed  . HIV Screening  Completed    Lab Results  Component Value Date   WBC 8.9 01/04/2017   HGB 12.5 01/04/2017   HCT 38.7 01/04/2017   PLT 329.0 01/04/2017   GLUCOSE 92 01/04/2017   CHOL 157 11/06/2016   TRIG 67.0 11/06/2016   HDL 61.60 11/06/2016   LDLCALC 82 11/06/2016   ALT 20 01/04/2017   AST 20 01/04/2017   NA 142 01/04/2017   K 4.0 01/04/2017   CL 104 01/04/2017   CREATININE 0.70 01/04/2017   BUN 13 01/04/2017   CO2 32 01/04/2017   TSH 0.63 10/02/2016   INR 1.0 11/19/2013   HGBA1C 6.2 10/02/2016    Lab Results  Component Value Date   TSH 0.63 10/02/2016   Lab Results  Component Value Date   WBC 8.9 01/04/2017   HGB 12.5 01/04/2017   HCT 38.7 01/04/2017   MCV 85.7 01/04/2017   PLT 329.0 01/04/2017   Lab Results  Component Value Date     NA 142 01/04/2017   K 4.0 01/04/2017   CO2 32 01/04/2017   GLUCOSE 92 01/04/2017   BUN 13 01/04/2017   CREATININE 0.70 01/04/2017   BILITOT 0.3 01/04/2017   ALKPHOS 53 01/04/2017   AST 20 01/04/2017   ALT 20 01/04/2017   PROT 7.4 01/04/2017   ALBUMIN 4.3 01/04/2017   CALCIUM 9.8 01/04/2017   ANIONGAP 7 01/10/2016   GFR 110.42 01/04/2017   Lab Results  Component Value Date   CHOL 157 11/06/2016   Lab Results  Component Value Date   HDL 61.60 11/06/2016   Lab Results  Component Value Date   LDLCALC 82 11/06/2016   Lab Results  Component Value Date   TRIG 67.0 11/06/2016   Lab Results  Component Value Date   CHOLHDL 3 11/06/2016   Lab Results  Component Value Date   HGBA1C 6.2 10/02/2016         Assessment & Plan:   Problem List Items Addressed This Visit    Essential hypertension (Chronic)    Well controlled, no changes to meds. Encouraged heart healthy diet such as the DASH diet and exercise as tolerated.       Relevant Orders   CBC   Comprehensive metabolic panel   TSH   Hyperglycemia    minimize simple carbs. Increase exercise as tolerated.       Relevant Orders   Hemoglobin A1c   Hyperlipidemia, mixed  Encouraged heart healthy diet, increase exercise, avoid trans fats, consider a krill oil cap daily      Relevant Orders   Lipid panel   Tachycardia    RRR today      Relevant Orders   CBC   Comprehensive metabolic panel   TSH   Back pain    She she has trouble with intermittent thoracic back pain she describes as spasms intermittently.  She is apprehensive about them because she has a family member who recently had a PE.  There are no associated symptoms such as shortness of breath or diaphoresis with the pain and it resolved spontaneously. Encouraged moist heat and gentle stretching as tolerated. May try NSAIDs and prescription meds as directed and report if symptoms worsen or seek immediate care         I am having Ruth Bell.  [place] maintain her cyclobenzaprine, levETIRAcetam, Azelastine HCl, gabapentin, ondansetron, famciclovir, benzonatate, pantoprazole, baclofen, QUEtiapine, citalopram, Metoprolol Tartrate, atorvastatin, busPIRone, traMADol, methylPREDNISolone, methocarbamol, metoprolol tartrate, amLODipine, ranitidine, and tiZANidine.  No orders of the defined types were placed in this encounter.   CMA served as Education administrator during this visit. History, Physical and Plan performed by medical provider. Documentation and orders reviewed and attested to.  [name], MD

## 2017-03-21 NOTE — Assessment & Plan Note (Signed)
RRR today 

## 2017-03-21 NOTE — Assessment & Plan Note (Signed)
Encouraged heart healthy diet, increase exercise, avoid trans fats, consider a krill oil cap daily 

## 2017-03-21 NOTE — Assessment & Plan Note (Signed)
She she has trouble with intermittent thoracic back pain she describes as spasms intermittently.  She is apprehensive about them because she has a family member who recently had a PE.  There are no associated symptoms such as shortness of breath or diaphoresis with the pain and it resolved spontaneously. Encouraged moist heat and gentle stretching as tolerated. May try NSAIDs and prescription meds as directed and report if symptoms worsen or seek immediate care

## 2017-04-02 ENCOUNTER — Ambulatory Visit: Payer: 59 | Admitting: Family Medicine

## 2017-04-02 ENCOUNTER — Other Ambulatory Visit: Payer: Self-pay | Admitting: Family Medicine

## 2017-04-30 ENCOUNTER — Other Ambulatory Visit: Payer: Self-pay | Admitting: Family Medicine

## 2017-05-07 ENCOUNTER — Ambulatory Visit (HOSPITAL_BASED_OUTPATIENT_CLINIC_OR_DEPARTMENT_OTHER)
Admission: RE | Admit: 2017-05-07 | Discharge: 2017-05-07 | Disposition: A | Discharge status: Home / Self Care | Payer: 59 | Source: Ambulatory Visit | Attending: Family Medicine | Admitting: Family Medicine

## 2017-05-07 ENCOUNTER — Ambulatory Visit (INDEPENDENT_AMBULATORY_CARE_PROVIDER_SITE_OTHER): Payer: 59 | Admitting: Family Medicine

## 2017-05-07 ENCOUNTER — Encounter: Payer: Self-pay | Admitting: Family Medicine

## 2017-05-07 ENCOUNTER — Other Ambulatory Visit: Payer: Self-pay | Admitting: Physician Assistant

## 2017-05-07 VITALS — BP 138/86 | HR 81 | Temp 98.0°F | Resp 18 | Wt 136.8 lb

## 2017-05-07 DIAGNOSIS — R350 Frequency of micturition: Secondary | ICD-10-CM

## 2017-05-07 DIAGNOSIS — R140 Abdominal distension (gaseous): Secondary | ICD-10-CM | POA: Insufficient documentation

## 2017-05-07 DIAGNOSIS — R109 Unspecified abdominal pain: Secondary | ICD-10-CM

## 2017-05-07 DIAGNOSIS — I700 Atherosclerosis of aorta: Secondary | ICD-10-CM | POA: Insufficient documentation

## 2017-05-07 DIAGNOSIS — G40909 Epilepsy, unspecified, not intractable, without status epilepticus: Secondary | ICD-10-CM | POA: Diagnosis not present

## 2017-05-07 DIAGNOSIS — R739 Hyperglycemia, unspecified: Secondary | ICD-10-CM | POA: Insufficient documentation

## 2017-05-07 DIAGNOSIS — K824 Cholesterolosis of gallbladder: Secondary | ICD-10-CM | POA: Diagnosis not present

## 2017-05-07 DIAGNOSIS — K828 Other specified diseases of gallbladder: Secondary | ICD-10-CM

## 2017-05-07 DIAGNOSIS — N281 Cyst of kidney, acquired: Secondary | ICD-10-CM | POA: Insufficient documentation

## 2017-05-07 DIAGNOSIS — L309 Dermatitis, unspecified: Secondary | ICD-10-CM

## 2017-05-07 LAB — URINALYSIS
Bilirubin Urine: NEGATIVE
Ketones, ur: NEGATIVE
Leukocytes, UA: NEGATIVE
Nitrite: NEGATIVE
Specific Gravity, Urine: <=1.005 — AB (ref 1.000–1.030)
Total Protein, Urine: NEGATIVE
Urine Glucose: NEGATIVE
Urobilinogen, UA: 0.2 (ref 0.0–1.0)
pH: 7 (ref 5.0–8.0)

## 2017-05-07 LAB — CBC WITH DIFFERENTIAL/PLATELET
Basophils Absolute: 0.1 10*3/uL (ref 0.0–0.1)
Basophils Relative: 0.6 % (ref 0.0–3.0)
Eosinophils Absolute: 0.2 10*3/uL (ref 0.0–0.7)
Eosinophils Relative: 1.9 % (ref 0.0–5.0)
HCT: 39.9 % (ref 36.0–46.0)
Hemoglobin: 13 g/dL (ref 12.0–15.0)
Lymphocytes Relative: 29.4 % (ref 12.0–46.0)
Lymphs Abs: 2.7 10*3/uL (ref 0.7–4.0)
MCHC: 32.6 g/dL (ref 30.0–36.0)
MCV: 85.1 fl (ref 78.0–100.0)
Monocytes Absolute: 0.5 10*3/uL (ref 0.1–1.0)
Monocytes Relative: 5.3 % (ref 3.0–12.0)
Neutro Abs: 5.7 10*3/uL (ref 1.4–7.7)
Neutrophils Relative %: 62.8 % (ref 43.0–77.0)
Platelets: 324 10*3/uL (ref 150.0–400.0)
RBC: 4.68 Mil/uL (ref 3.87–5.11)
RDW: 15.4 % (ref 11.5–15.5)
WBC: 9.1 10*3/uL (ref 4.0–10.5)

## 2017-05-07 LAB — GAMMA GT: GGT: 39 U/L (ref 7–51)

## 2017-05-07 LAB — COMPREHENSIVE METABOLIC PANEL
ALT: 22 U/L (ref 0–35)
AST: 20 U/L (ref 0–37)
Albumin: 4.2 g/dL (ref 3.5–5.2)
Alkaline Phosphatase: 62 U/L (ref 39–117)
BUN: 8 mg/dL (ref 6–23)
CO2: 28 mEq/L (ref 19–32)
Calcium: 9.7 mg/dL (ref 8.4–10.5)
Chloride: 107 mEq/L (ref 96–112)
Creatinine, Ser: 0.61 mg/dL (ref 0.40–1.20)
GFR: 129.27 mL/min (ref >60.00)
Glucose, Bld: 84 mg/dL (ref 70–99)
Potassium: 3.6 mEq/L (ref 3.5–5.1)
Sodium: 141 mEq/L (ref 135–145)
Total Bilirubin: 0.5 mg/dL (ref 0.2–1.2)
Total Protein: 7.6 g/dL (ref 6.0–8.3)

## 2017-05-07 LAB — LACTATE DEHYDROGENASE: LDH: 187 U/L (ref 120–250)

## 2017-05-07 LAB — SEDIMENTATION RATE: Sed Rate: 37 mm/hr — ABNORMAL HIGH (ref 0–30)

## 2017-05-07 MED ORDER — METOPROLOL TARTRATE 50 MG PO TABS: 50.0000 mg | ORAL_TABLET | Freq: Two times a day (BID) | ORAL | 4 refills | Status: DC

## 2017-05-07 MED ORDER — METOPROLOL TARTRATE 75 MG PO TABS: 75.0000 mg | ORAL_TABLET | Freq: Two times a day (BID) | ORAL | 3 refills | Status: DC

## 2017-05-07 MED ORDER — RANITIDINE HCL 150 MG PO TABS: 150.0000 mg | ORAL_TABLET | Freq: Two times a day (BID) | ORAL | 2 refills | Status: DC

## 2017-05-07 MED ORDER — TIZANIDINE HCL 4 MG PO TABS: 4.0000 mg | ORAL_TABLET | Freq: Three times a day (TID) | ORAL | 0 refills | Status: DC | PRN

## 2017-05-07 MED ORDER — FAMCICLOVIR 500 MG PO TABS: 500.0000 mg | ORAL_TABLET | Freq: Three times a day (TID) | ORAL | 0 refills | Status: DC

## 2017-05-07 NOTE — Progress Notes (Signed)
Subjective:  I acted as a Education administrator for Dr. Charlett Blake. Ruth Bell, [place]  Patient ID: Ruth Bell, female    DOB: Apr 13, 1960, 57 y.o.   MRN: [id number]  No chief complaint on file.   HPI  Patient is in today for a follow up. Patient c/o right side abdominal swelling. With pain worse on right side than left. Has been going on about 2 weeks. Bowels are moving but she does note that her appetite is suppressed and she feels full quickly. Has had an appendectomy and hysterectomy which was at age 47 for heavy bleeding. She reports she was on exogenous hormones til maybe age 38 then stopped. No FH of any concerning GYN diseases. Denies CP/palp/SOB/HA/congestion/fevers. Taking meds as prescribed    ePatient Care Team: Mosie Lukes, MD as PCP - General (Family Medicine)   Past Medical History:  Diagnosis Date  . Anemia   . Anxiety   . Arm skin lesion, left 09/05/2014  . Constipation 12/27/2013  . Depression   . Dysphagia 04/15/2016  . Eczema 08/22/2015  . GERD (gastroesophageal reflux disease)   . Hematuria 03/04/2016  . History of shingles 01/22/2016  . Hyperglycemia 01/15/2015  . Hyperlipidemia, mixed 08/22/2015  . Hypertension   . Menometrorrhagia 2006  . Migraine, unspecified, without mention of intractable migraine without mention of status migrainosus   . Preventative health care 11/04/2016  . Pseudoseizures   . Seizures ([place])    history of pseudoseizure disorder, last last seizure 3 wks, keppra inc in dosage  . Tubular adenoma of colon 05/2011  . Uterine leiomyoma 2006    Past Surgical History:  Procedure Laterality Date  . ABDOMINAL HYSTERECTOMY  04/06/04  . APPENDECTOMY    . BILATERAL SALPINGOOPHORECTOMY  04/06/04  . MASS EXCISION Left 06/02/2015   Procedure: EXCISION LEFT ARM MASS;  Surgeon: Ruth Messing III, MD;  Location: [facility];  Service: General;  Laterality: Left;  . ROTATOR CUFF REPAIR      Family History  Problem Relation Age of Onset  . Heart disease  Mother   . Heart attack Mother   . Cancer Father        colon  . Hypertension Father   . Cancer Sister        brain  . Cancer Brother   . Hypertension Sister   . Cancer Sister        unsure of type  . Diabetes Sister        type 2  . Heart attack Sister   . Hypertension Brother   . Colon cancer Neg Hx     Social History   Socioeconomic History  . Marital status: Married    Spouse name: Ruth Bell  . Number of children: 2  . Years of education: Not on file  . Highest education level: Not on file  Social Needs  . Financial resource strain: Not on file  . Food insecurity - worry: Not on file  . Food insecurity - inability: Not on file  . Transportation needs - medical: Not on file  . Transportation needs - non-medical: Not on file  Occupational History  . Occupation: CMA-retired  Tobacco Use  . Smoking status: Light Tobacco Smoker    Packs/day: 0.50    Years: 40.00    Pack years: 20.00    Types: Cigarettes    Start date: 12/18/2015  . Smokeless tobacco: Never Used  . Tobacco comment: 3-4 cigarettes daily  Substance and Sexual Activity  . Alcohol  use: No  . Drug use: No  . Sexual activity: Not on file    Comment: lives with husband, no dietary restrictions.   Other Topics Concern  . Not on file  Social History Narrative   Lives with her husband and their son.   Daughter lives in [place], [place].    Outpatient Medications Prior to Visit  Medication Sig Dispense Refill  . amLODipine (NORVASC) 2.5 MG tablet Take 1 tablet (2.5 mg total) by mouth 2 (two) times daily. 60 tablet 3  . atorvastatin (LIPITOR) 10 MG tablet TAKE 1 TABLET BY MOUTH  DAILY 90 tablet 1  . Azelastine HCl 0.15 % SOLN Place 2 sprays into both nostrils 2 (two) times daily. 30 mL 0  . baclofen (LIORESAL) 10 MG tablet TAKE 1/2 TABLET BY MOUTH 3 TIMES A DAY  4  . benzonatate (TESSALON) 100 MG capsule TAKE 1-2 CAPSULES BY MOUTH  3 TIMES DAILY AS NEEDED FOR COUGH. 40 capsule 0  . busPIRone (BUSPAR) 7.5 MG  tablet Take 1 tablet (7.5 mg total) 3 (three) times daily by mouth. 90 tablet 1  . citalopram (CELEXA) 20 MG tablet Take 1 tablet (20 mg total) by mouth daily. 90 tablet 1  . cyclobenzaprine (FLEXERIL) 10 MG tablet Take 1 tablet (10 mg total) by mouth 3 (three) times daily as needed for muscle spasms. 90 tablet 1  . gabapentin (NEURONTIN) 600 MG tablet TAKE 1 TABLET BY MOUTH 3  TIMES DAILY. REPORTED ON  05/26/2015 180 tablet 0  . levETIRAcetam (KEPPRA) 500 MG tablet TAKE 1 TABLET (500 MG TOTAL) BY MOUTH TWO (2) TIMES A DAY.  3  . methocarbamol (ROBAXIN) 500 MG tablet Take 1 tablet (500 mg total) by mouth 3 (three) times daily. 30 tablet 0  . methylPREDNISolone (MEDROL) 4 MG tablet TAKE 6 DAY DOSE PAK AS DIRECTED 21 tablet 0  . ondansetron (ZOFRAN ODT) 4 MG disintegrating tablet Take 1 tablet (4 mg total) by mouth every 8 (eight) hours as needed for nausea or vomiting. 20 tablet 0  . pantoprazole (PROTONIX) 40 MG tablet Take 1 tablet (40 mg total) by mouth 2 (two) times daily. TAKE 1 TABLET (40 MG TOTAL) BY MOUTH DAILY. 180 tablet 2  . QUEtiapine (SEROQUEL) 300 MG tablet Take 1 tablet (300 mg total) by mouth at bedtime. 90 tablet 1  . traMADol (ULTRAM) 50 MG tablet Take 1 tablet (50 mg total) by mouth every 8 (eight) hours as needed. 45 tablet 0  . famciclovir (FAMVIR) 500 MG tablet Take 1 tablet (500 mg total) by mouth 3 (three) times daily. 21 tablet 0  . metoprolol tartrate (LOPRESSOR) 50 MG tablet TAKE 1 TABLET BY MOUTH TWO  TIMES DAILY 120 tablet 4  . metoprolol tartrate 75 MG TABS Take 75 mg by mouth 2 (two) times daily. 180 tablet 3  . ranitidine (ZANTAC) 150 MG tablet Take 1 tablet (150 mg total) by mouth 2 (two) times daily. 180 tablet 2  . ranitidine (ZANTAC) 150 MG tablet TAKE 1 TABLET BY MOUTH TWO  TIMES DAILY 180 tablet 2  . tiZANidine (ZANAFLEX) 4 MG tablet Take 1 tablet (4 mg total) by mouth every 8 (eight) hours as needed for muscle spasms. 30 tablet 0   No facility-administered  medications prior to visit.     No Known Allergies  Review of Systems  Constitutional: Negative for fever and malaise/fatigue.  HENT: Negative for congestion.   Eyes: Negative for blurred vision.  Respiratory: Negative for cough and shortness  of breath.   Cardiovascular: Negative for chest pain, palpitations and leg swelling.  Gastrointestinal: Positive for abdominal pain and nausea. Negative for blood in stool, constipation, diarrhea, melena and vomiting.  Musculoskeletal: Negative for back pain.  Skin: Negative for rash.  Neurological: Negative for loss of consciousness and headaches.       Objective:    Physical Exam  Constitutional: She is oriented to person, place, and time. She appears well-developed and well-nourished. No distress.  HENT:  Head: Normocephalic and atraumatic.  Eyes: Conjunctivae are normal.  Neck: Normal range of motion. No thyromegaly present.  Cardiovascular: Normal rate and regular rhythm.  Pulmonary/Chest: Effort normal and breath sounds normal. She has no wheezes.  Abdominal: Soft. Bowel sounds are normal. She exhibits distension. She exhibits no mass. There is tenderness. There is no rebound and no guarding.  Musculoskeletal: Normal range of motion. She exhibits no edema or deformity.  Neurological: She is alert and oriented to person, place, and time.  Skin: Skin is warm and dry. She is not diaphoretic.  Psychiatric: She has a normal mood and affect.    BP 138/86 (BP Location: Left Arm, Patient Position: Sitting, Cuff Size: Normal)   Pulse 81   Temp 98 F (36.7 C) (Oral)   Resp 18   Wt 136 lb 12.8 oz (62.1 kg)   LMP  (LMP Unknown)   SpO2 98%   BMI 25.17 kg/m  Wt Readings from Last 3 Encounters:  05/07/17 136 lb 12.8 oz (62.1 kg)  03/21/17 128 lb 3.2 oz (58.2 kg)  02/28/17 125 lb (56.7 kg)   BP Readings from Last 3 Encounters:  05/07/17 138/86  03/21/17 130/80  02/28/17 135/79     Immunization History  Administered Date(s)  Administered  . Influenza Split 03/06/2011, 02/29/2012  . Pneumococcal Polysaccharide-23 01/09/2008, 02/23/2012  . Td 04/28/2007    Health Maintenance  Topic Date Due  . TETANUS/TDAP  04/27/2017  . INFLUENZA VACCINE  01/07/2018 (Originally 10/17/2016)  . MAMMOGRAM  05/29/2017  . COLONOSCOPY  07/26/2020  . Hepatitis C Screening  Completed  . HIV Screening  Completed    Lab Results  Component Value Date   WBC 9.1 05/07/2017   HGB 13.0 05/07/2017   HCT 39.9 05/07/2017   PLT 324.0 05/07/2017   GLUCOSE 84 05/07/2017   CHOL 184 03/21/2017   TRIG 119.0 03/21/2017   HDL 64.50 03/21/2017   LDLCALC 96 03/21/2017   ALT 22 05/07/2017   AST 20 05/07/2017   NA 141 05/07/2017   K 3.6 05/07/2017   CL 107 05/07/2017   CREATININE 0.61 05/07/2017   BUN 8 05/07/2017   CO2 28 05/07/2017   TSH 0.78 03/21/2017   INR 1.0 11/19/2013   HGBA1C 6.7 (H) 03/21/2017    Lab Results  Component Value Date   TSH 0.78 03/21/2017   Lab Results  Component Value Date   WBC 9.1 05/07/2017   HGB 13.0 05/07/2017   HCT 39.9 05/07/2017   MCV 85.1 05/07/2017   PLT 324.0 05/07/2017   Lab Results  Component Value Date   NA 141 05/07/2017   K 3.6 05/07/2017   CO2 28 05/07/2017   GLUCOSE 84 05/07/2017   BUN 8 05/07/2017   CREATININE 0.61 05/07/2017   BILITOT 0.5 05/07/2017   ALKPHOS 62 05/07/2017   AST 20 05/07/2017   ALT 22 05/07/2017   PROT 7.6 05/07/2017   ALBUMIN 4.2 05/07/2017   CALCIUM 9.7 05/07/2017   ANIONGAP 7 01/10/2016   GFR 129.27  05/07/2017   Lab Results  Component Value Date   CHOL 184 03/21/2017   Lab Results  Component Value Date   HDL 64.50 03/21/2017   Lab Results  Component Value Date   LDLCALC 96 03/21/2017   Lab Results  Component Value Date   TRIG 119.0 03/21/2017   Lab Results  Component Value Date   CHOLHDL 3 03/21/2017   Lab Results  Component Value Date   HGBA1C 6.7 (H) 03/21/2017         Assessment & Plan:   Problem List Items Addressed This  Visit    Seizure disorder ([place]) (Chronic)    No recent episodes or concerns.      Abdominal pain - Primary    Will proceed with labs, xray and ultrasound. She will seek care if she worsens      Relevant Orders   CBC with Differential/Platelet (Completed)   Comprehensive metabolic panel (Completed)   Sedimentation rate (Completed)   Gamma GT (Completed)   Lactate Dehydrogenase (LDH) (Completed)   Hyperglycemia    hgba1c acceptable, minimize simple carbs. Increase exercise as tolerated.       Relevant Orders   DG Abd 2 Views (Completed)   US Abdomen Complete (Completed)   Urinary frequency    Check a urinalysis and urine culture today      Relevant Orders   Urinalysis (Completed)   Urine Culture (Completed)   DG Abd 2 Views (Completed)   US Abdomen Complete (Completed)   Abdominal bloating    With pain worse on right side than left. Has been going on about 2 weeks. Bowels are moving but she does note that her appetite is suppressed and she feels full quickly. Has had an appendectomy and hysterectomy which was at age 37 for heavy bleeding. She reports she was on exogenous hormones til maybe age 79 then stopped. No FH of any concerning GYN diseases.      Relevant Orders   CBC with Differential/Platelet (Completed)   Comprehensive metabolic panel (Completed)   Sedimentation rate (Completed)   Gamma GT (Completed)   Lactate Dehydrogenase (LDH) (Completed)      I have discontinued Oletta Cohn. Zajkowski's ranitidine. I have also changed her ranitidine and metoprolol tartrate. Additionally, I am having her maintain her cyclobenzaprine, levETIRAcetam, Azelastine HCl, ondansetron, benzonatate, pantoprazole, baclofen, QUEtiapine, citalopram, atorvastatin, busPIRone, traMADol, methylPREDNISolone, methocarbamol, amLODipine, gabapentin, Metoprolol Tartrate, tiZANidine, and famciclovir.  Meds ordered this encounter  Medications  . Metoprolol Tartrate 75 MG TABS    Sig: Take 75 mg by mouth  2 (two) times daily.    Dispense:  180 tablet    Refill:  3  . tiZANidine (ZANAFLEX) 4 MG tablet    Sig: Take 1 tablet (4 mg total) by mouth every 8 (eight) hours as needed for muscle spasms.    Dispense:  30 tablet    Refill:  0  . famciclovir (FAMVIR) 500 MG tablet    Sig: Take 1 tablet (500 mg total) by mouth 3 (three) times daily.    Dispense:  21 tablet    Refill:  0  . ranitidine (ZANTAC) 150 MG tablet    Sig: Take 1 tablet (150 mg total) by mouth 2 (two) times daily.    Dispense:  180 tablet    Refill:  2  . metoprolol tartrate (LOPRESSOR) 50 MG tablet    Sig: Take 1 tablet (50 mg total) by mouth 2 (two) times daily.    Dispense:  120 tablet  Refill:  4    CMA served as Education administrator during this visit. History, Physical and Plan performed by medical provider. Documentation and orders reviewed and attested to.  [name], MD

## 2017-05-07 NOTE — Telephone Encounter (Signed)
Triamcinolone cream 1% refill Last OV: 05/07/17 Last Refill:12/09/15 per [place]: [place], [place]

## 2017-05-07 NOTE — Assessment & Plan Note (Signed)
No recent episodes or concerns. 

## 2017-05-07 NOTE — Assessment & Plan Note (Signed)
hgba1c acceptable, minimize simple carbs. Increase exercise as tolerated.  

## 2017-05-07 NOTE — Patient Instructions (Signed)
Edema Edema is an abnormal buildup of fluids in your bodytissues. Edema is somewhatdependent on gravity to pull the fluid to the lowest place in your body. That makes the condition more common in the legs and thighs (lower extremities). Painless swelling of the feet and ankles is common and becomes more likely as you get older. It is also common in looser tissues, like around your eyes. When the affected area is squeezed, the fluid may move out of that spot and leave a dent for a few moments. This dent is called pitting. What are the causes? There are many possible causes of edema. Eating too much salt and being on your feet or sitting for a long time can cause edema in your legs and ankles. Hot weather may make edema worse. Common medical causes of edema include:  Heart failure.  Liver disease.  Kidney disease.  Weak blood vessels in your legs.  Cancer.  An injury.  Pregnancy.  Some medications.  Obesity.  What are the signs or symptoms? Edema is usually painless.Your skin may look swollen or shiny. How is this diagnosed? Your health care provider may be able to diagnose edema by asking about your medical history and doing a physical exam. You may need to have tests such as X-rays, an electrocardiogram, or blood tests to check for medical conditions that may cause edema. How is this treated? Edema treatment depends on the cause. If you have heart, liver, or kidney disease, you need the treatment appropriate for these conditions. General treatment may include:  Elevation of the affected body part above the level of your heart.  Compression of the affected body part. Pressure from elastic bandages or support stockings squeezes the tissues and forces fluid back into the blood vessels. This keeps fluid from entering the tissues.  Restriction of fluid and salt intake.  Use of a water pill (diuretic). These medications are appropriate only for some types of edema. They pull fluid  out of your body and make you urinate more often. This gets rid of fluid and reduces swelling, but diuretics can have side effects. Only use diuretics as directed by your health care provider.  Follow these instructions at home:  Keep the affected body part above the level of your heart when you are lying down.  Do not sit still or stand for prolonged periods.  Do not put anything directly under your knees when lying down.  Do not wear constricting clothing or garters on your upper legs.  Exercise your legs to work the fluid back into your blood vessels. This may help the swelling go down.  Wear elastic bandages or support stockings to reduce ankle swelling as directed by your health care provider.  Eat a low-salt diet to reduce fluid if your health care provider recommends it.  Only take medicines as directed by your health care provider. Contact a health care provider if:  Your edema is not responding to treatment.  You have heart, liver, or kidney disease and notice symptoms of edema.  You have edema in your legs that does not improve after elevating them.  You have sudden and unexplained weight gain. Get help right away if:  You develop shortness of breath or chest pain.  You cannot breathe when you lie down.  You develop pain, redness, or warmth in the swollen areas.  You have heart, liver, or kidney disease and suddenly get edema.  You have a fever and your symptoms suddenly get worse. This information is   not intended to replace advice given to you by your health care provider. Make sure you discuss any questions you have with your health care provider. Document Released: 03/05/2005 Document Revised: 08/11/2015 Document Reviewed: 12/26/2012 Elsevier Interactive Patient Education  2017 Elsevier Inc.  

## 2017-05-07 NOTE — Assessment & Plan Note (Signed)
Check a urinalysis and urine culture today

## 2017-05-07 NOTE — Assessment & Plan Note (Addendum)
With pain worse on right side than left. Has been going on about 2 weeks. Bowels are moving but she does note that her appetite is suppressed and she feels full quickly. Has had an appendectomy and hysterectomy which was at age 57 for heavy bleeding. She reports she was on exogenous hormones til maybe age 16 then stopped. No FH of any concerning GYN diseases.

## 2017-05-08 LAB — URINE CULTURE
MICRO NUMBER:: 90218100
Result:: NO GROWTH
SPECIMEN QUALITY:: ADEQUATE

## 2017-05-08 NOTE — Assessment & Plan Note (Signed)
Will proceed with labs, xray and ultrasound. She will seek care if she worsens

## 2017-05-08 NOTE — Telephone Encounter (Signed)
Pt is requesting refill on Kenalog 0.1 % cream. No longer on med list. Please advise.

## 2017-05-08 NOTE — Telephone Encounter (Signed)
I am willing to send in a steroid cream but I do not see where I ever prescribed it so just confirm what she needs it for and I will send in Triamcinolone or Kenaog for her.

## 2017-05-09 NOTE — Addendum Note (Signed)
Addended by: Magdalene Molly A on: 05/09/2017 04:12 PM   Modules accepted: Orders

## 2017-05-10 NOTE — Telephone Encounter (Signed)
She stated for eczema

## 2017-05-17 ENCOUNTER — Other Ambulatory Visit: Payer: Self-pay | Admitting: Surgery

## 2017-05-17 ENCOUNTER — Other Ambulatory Visit: Payer: Self-pay

## 2017-05-17 ENCOUNTER — Emergency Department (HOSPITAL_COMMUNITY)
Admission: EM | Admit: 2017-05-17 | Discharge: 2017-05-17 | Disposition: A | Discharge status: Home / Self Care | Payer: 59 | Attending: Emergency Medicine | Admitting: Emergency Medicine

## 2017-05-17 ENCOUNTER — Encounter (HOSPITAL_COMMUNITY): Payer: Self-pay

## 2017-05-17 ENCOUNTER — Emergency Department (HOSPITAL_COMMUNITY): Payer: 59

## 2017-05-17 DIAGNOSIS — Z79899 Other long term (current) drug therapy: Secondary | ICD-10-CM | POA: Diagnosis not present

## 2017-05-17 DIAGNOSIS — F17210 Nicotine dependence, cigarettes, uncomplicated: Secondary | ICD-10-CM | POA: Diagnosis not present

## 2017-05-17 DIAGNOSIS — E782 Mixed hyperlipidemia: Secondary | ICD-10-CM | POA: Insufficient documentation

## 2017-05-17 DIAGNOSIS — R1084 Generalized abdominal pain: Secondary | ICD-10-CM

## 2017-05-17 DIAGNOSIS — I10 Essential (primary) hypertension: Secondary | ICD-10-CM | POA: Insufficient documentation

## 2017-05-17 LAB — URINALYSIS, ROUTINE W REFLEX MICROSCOPIC
Bacteria, UA: NONE SEEN
Bilirubin Urine: NEGATIVE
Glucose, UA: NEGATIVE mg/dL
Ketones, ur: NEGATIVE mg/dL
Leukocytes, UA: NEGATIVE
Nitrite: NEGATIVE
Protein, ur: NEGATIVE mg/dL
Specific Gravity, Urine: 1.013 (ref 1.005–1.030)
pH: 6 (ref 5.0–8.0)

## 2017-05-17 LAB — COMPREHENSIVE METABOLIC PANEL
ALT: 24 U/L (ref 14–54)
AST: 25 U/L (ref 15–41)
Albumin: 4 g/dL (ref 3.5–5.0)
Alkaline Phosphatase: 70 U/L (ref 38–126)
Anion gap: 12 (ref 5–15)
BUN: 8 mg/dL (ref 6–20)
CO2: 24 mmol/L (ref 22–32)
Calcium: 9.4 mg/dL (ref 8.9–10.3)
Chloride: 104 mmol/L (ref 101–111)
Creatinine, Ser: 0.67 mg/dL (ref 0.44–1.00)
GFR calc Af Amer: >60 mL/min (ref >60)
GFR calc non Af Amer: >60 mL/min (ref >60)
Glucose, Bld: 106 mg/dL — ABNORMAL HIGH (ref 65–99)
Potassium: 3.8 mmol/L (ref 3.5–5.1)
Sodium: 140 mmol/L (ref 135–145)
Total Bilirubin: 0.9 mg/dL (ref 0.3–1.2)
Total Protein: 7.6 g/dL (ref 6.5–8.1)

## 2017-05-17 LAB — CBC
HCT: 40.3 % (ref 36.0–46.0)
Hemoglobin: 13.3 g/dL (ref 12.0–15.0)
MCH: 27.9 pg (ref 26.0–34.0)
MCHC: 33 g/dL (ref 30.0–36.0)
MCV: 84.7 fL (ref 78.0–100.0)
Platelets: 339 10*3/uL (ref 150–400)
RBC: 4.76 MIL/uL (ref 3.87–5.11)
RDW: 15.2 % (ref 11.5–15.5)
WBC: 10.4 10*3/uL (ref 4.0–10.5)

## 2017-05-17 LAB — LIPASE, BLOOD: Lipase: 36 U/L (ref 11–51)

## 2017-05-17 MED ORDER — SODIUM CHLORIDE 0.9 % IV BOLUS (SEPSIS)
500.0000 mL | Freq: Once | INTRAVENOUS | Status: AC
  Administered 2017-05-17: 500 mL via INTRAVENOUS

## 2017-05-17 MED ORDER — ONDANSETRON HCL 4 MG/2ML IJ SOLN
4.0000 mg | Freq: Once | INTRAMUSCULAR | Status: AC
  Administered 2017-05-17: 4 mg via INTRAVENOUS
  Filled 2017-05-17: qty 2

## 2017-05-17 MED ORDER — IOPAMIDOL (ISOVUE-300) INJECTION 61%
INTRAVENOUS | Status: AC
  Administered 2017-05-17: 100 mL
  Filled 2017-05-17: qty 100

## 2017-05-17 MED ORDER — MORPHINE SULFATE (PF) 4 MG/ML IV SOLN
4.0000 mg | Freq: Once | INTRAVENOUS | Status: AC
  Administered 2017-05-17: 4 mg via INTRAVENOUS
  Filled 2017-05-17: qty 1

## 2017-05-17 MED ORDER — POLYETHYLENE GLYCOL 3350 17 G PO PACK: 17.0000 g | PACK | Freq: Every day | ORAL | 0 refills | Status: DC

## 2017-05-17 MED ORDER — KETOROLAC TROMETHAMINE 30 MG/ML IJ SOLN
30.0000 mg | Freq: Once | INTRAMUSCULAR | Status: AC
  Administered 2017-05-17: 30 mg via INTRAVENOUS
  Filled 2017-05-17: qty 1

## 2017-05-17 NOTE — ED Notes (Signed)
Patient transported to CT 

## 2017-05-17 NOTE — ED Triage Notes (Signed)
Patient complains of 4 weeks of abdominal bloating and complains of pain and pressure to same. No SOB, no difficulty with urination or BM. No nausea, no vomiting. States that an [name] last week showed a mass

## 2017-05-17 NOTE — ED Notes (Signed)
Attempted PIV x 1; no success. Second RN to attempt.

## 2017-05-17 NOTE — ED Notes (Signed)
Patient requesting pain medicine prior to discharge. EDP made aware.

## 2017-05-17 NOTE — ED Notes (Signed)
Second RN attempted; no success. IV team consult placed.

## 2017-05-17 NOTE — ED Provider Notes (Signed)
[facility] EMERGENCY DEPARTMENT Provider Note   CSN: [id number] Arrival date & time: 05/17/17  9924     History   Chief Complaint Chief Complaint  Patient presents with  . abdominal distention    HPI Ruth Bell is a 57 y.o. female.  She presents with 3-4 weeks of increased abdominal distention associated with some right lower abdominal pain radiating around the flank into the back.  The pain is moderate in severity but getting worse constant.  She states she has been moving her bowels normally and does not endorse any constipation or diarrhea.  No melena.  No fever cough shortness of breath or chest pain.  She had an x-ray and ultrasound ordered by her PCP and was referred to general surgery today who told him they needed to come to the emergency department.  The history is provided by the patient and the spouse.  Abdominal Pain   This is a new problem. The current episode started more than 1 week ago. The problem occurs constantly. The problem has been gradually worsening. The pain is located in the RLQ. The pain is moderate. Associated symptoms include flatus. Pertinent negatives include fever, belching, diarrhea, hematochezia, melena, nausea, vomiting, constipation, dysuria, frequency, hematuria and arthralgias. Nothing aggravates the symptoms. Nothing relieves the symptoms. Past workup includes ultrasound.    Past Medical History:  Diagnosis Date  . Anemia   . Anxiety   . Arm skin lesion, left 09/05/2014  . Constipation 12/27/2013  . Depression   . Dysphagia 04/15/2016  . Eczema 08/22/2015  . GERD (gastroesophageal reflux disease)   . Hematuria 03/04/2016  . History of shingles 01/22/2016  . Hyperglycemia 01/15/2015  . Hyperlipidemia, mixed 08/22/2015  . Hypertension   . Menometrorrhagia 2006  . Migraine, unspecified, without mention of intractable migraine without mention of status migrainosus   . Preventative health care 11/04/2016  . Pseudoseizures   .  Seizures ([place])    history of pseudoseizure disorder, last last seizure 3 wks, keppra inc in dosage  . Tubular adenoma of colon 05/2011  . Uterine leiomyoma 2006    Patient Active Problem List   Diagnosis Date Noted  . Urinary frequency 05/07/2017  . Abdominal bloating 05/07/2017  . Back pain 03/21/2017  . Tachycardia 02/28/2017  . Preventative health care 11/04/2016  . Hoarseness 04/15/2016  . Dysphagia 04/15/2016  . Anterior chest wall pain 04/13/2016  . Dysuria 03/04/2016  . Hematuria 03/04/2016  . Palpitations 01/30/2016  . History of shingles 01/22/2016  . Eczema 08/22/2015  . Hyperlipidemia, mixed 08/22/2015  . Hyperglycemia 01/15/2015  . Lumbar radiculopathy 11/19/2014  . Arm skin lesion, left 09/05/2014  . Noncompliance with medications 08/11/2014  . Constipation 12/27/2013  . Pain in joint, shoulder region 10/13/2013  . Shoulder pain 09/23/2013  . Numbness and tingling in right hand 09/23/2013  . Jaw pain 03/26/2013  . Neck pain 05/17/2012  . Seizure disorder ([place]) 02/21/2012  . Non compliance w medication regimen 02/21/2012  . Essential hypertension 09/06/2011  . Abdominal pain 06/20/2011  . Hemorrhoids 06/20/2011  . Hx of adenomatous colonic polyps 06/13/2011  . Musculoskeletal pain 04/23/2011  . Radiculopathy of leg 02/02/2011  . History of pseudoseizure 07/10/2010  . Anxiety and depression 12/29/2009  . TINEA PEDIS 06/02/2009  . Exertional dyspnea 01/13/2008  . MICROSCOPIC HEMATURIA 06/24/2007  . WEIGHT LOSS 04/28/2007  . Anemia 03/10/2007  . Migraine 03/10/2007  . ARTHRITIS 03/10/2007  . SYNCOPE 03/10/2007    Past Surgical History:  Procedure  Laterality Date  . ABDOMINAL HYSTERECTOMY  04/06/04  . APPENDECTOMY    . BILATERAL SALPINGOOPHORECTOMY  04/06/04  . MASS EXCISION Left 06/02/2015   Procedure: EXCISION LEFT ARM MASS;  Surgeon: Autumn Messing III, MD;  Location: [facility];  Service: General;  Laterality: Left;  . ROTATOR CUFF REPAIR       OB History    No data available       Home Medications    Prior to Admission medications   Medication Sig Start Date End Date Taking? Authorizing Provider  amLODipine (NORVASC) 2.5 MG tablet Take 1 tablet (2.5 mg total) by mouth 2 (two) times daily. 02/28/17   Mosie Lukes, MD  atorvastatin (LIPITOR) 10 MG tablet TAKE 1 TABLET BY MOUTH  DAILY 01/28/17   Mosie Lukes, MD  Azelastine HCl 0.15 % SOLN Place 2 sprays into both nostrils 2 (two) times daily. 12/09/15   Harrison Mons, PA-C  baclofen (LIORESAL) 10 MG tablet TAKE 1/2 TABLET BY MOUTH 3 TIMES A DAY 03/18/16   [provider]  benzonatate (TESSALON) 100 MG capsule TAKE 1-2 CAPSULES BY MOUTH  3 TIMES DAILY AS NEEDED FOR COUGH. 03/09/16   Mosie Lukes, MD  busPIRone (BUSPAR) 7.5 MG tablet Take 1 tablet (7.5 mg total) 3 (three) times daily by mouth. 01/29/17   Mosie Lukes, MD  citalopram (CELEXA) 20 MG tablet Take 1 tablet (20 mg total) by mouth daily. 11/12/16   Mosie Lukes, MD  cyclobenzaprine (FLEXERIL) 10 MG tablet Take 1 tablet (10 mg total) by mouth 3 (three) times daily as needed for muscle spasms. 02/21/15   Mosie Lukes, MD  famciclovir (FAMVIR) 500 MG tablet Take 1 tablet (500 mg total) by mouth 3 (three) times daily. 05/07/17   Mosie Lukes, MD  gabapentin (NEURONTIN) 600 MG tablet TAKE 1 TABLET BY MOUTH 3  TIMES DAILY. REPORTED ON  05/26/2015 05/01/17   Mosie Lukes, MD  levETIRAcetam (KEPPRA) 500 MG tablet TAKE 1 TABLET (500 MG TOTAL) BY MOUTH TWO (2) TIMES A DAY. 03/21/15   [provider]  methocarbamol (ROBAXIN) 500 MG tablet Take 1 tablet (500 mg total) by mouth 3 (three) times daily. 02/06/17   Meredith Pel, MD  methylPREDNISolone (MEDROL) 4 MG tablet TAKE 6 DAY DOSE PAK AS DIRECTED 02/06/17   Meredith Pel, MD  metoprolol tartrate (LOPRESSOR) 50 MG tablet Take 1 tablet (50 mg total) by mouth 2 (two) times daily. 05/07/17   Mosie Lukes, MD  Metoprolol Tartrate 75 MG  TABS Take 75 mg by mouth 2 (two) times daily. 05/07/17   Mosie Lukes, MD  ondansetron (ZOFRAN ODT) 4 MG disintegrating tablet Take 1 tablet (4 mg total) by mouth every 8 (eight) hours as needed for nausea or vomiting. 02/21/16   Mosie Lukes, MD  pantoprazole (PROTONIX) 40 MG tablet Take 1 tablet (40 mg total) by mouth 2 (two) times daily. TAKE 1 TABLET (40 MG TOTAL) BY MOUTH DAILY. 04/10/16   Mosie Lukes, MD  QUEtiapine (SEROQUEL) 300 MG tablet Take 1 tablet (300 mg total) by mouth at bedtime. 10/30/16   Mosie Lukes, MD  ranitidine (ZANTAC) 150 MG tablet Take 1 tablet (150 mg total) by mouth 2 (two) times daily. 05/07/17   Mosie Lukes, MD  tiZANidine (ZANAFLEX) 4 MG tablet Take 1 tablet (4 mg total) by mouth every 8 (eight) hours as needed for muscle spasms. 05/07/17   Mosie Lukes,  MD  traMADol (ULTRAM) 50 MG tablet Take 1 tablet (50 mg total) by mouth every 8 (eight) hours as needed. 02/06/17   Meredith Pel, MD  triamcinolone cream (KENALOG) 0.1 % APPLY 1 APPLICATION TOPICALLY 2 (TWO) TIMES DAILY. 05/10/17   Mosie Lukes, MD    Family History Family History  Problem Relation Age of Onset  . Heart disease Mother   . Heart attack Mother   . Cancer Father        colon  . Hypertension Father   . Cancer Sister        brain  . Cancer Brother   . Hypertension Sister   . Cancer Sister        unsure of type  . Diabetes Sister        type 2  . Heart attack Sister   . Hypertension Brother   . Colon cancer Neg Hx     Social History Social History   Tobacco Use  . Smoking status: Light Tobacco Smoker    Packs/day: 0.50    Years: 40.00    Pack years: 20.00    Types: Cigarettes    Start date: 12/18/2015  . Smokeless tobacco: Never Used  . Tobacco comment: 3-4 cigarettes daily  Substance Use Topics  . Alcohol use: Yes  . Drug use: No     Allergies   Patient has no known allergies.   Review of Systems Review of Systems  Constitutional: Negative for  chills and fever.  HENT: Negative for ear pain and sore throat.   Eyes: Negative for pain and visual disturbance.  Respiratory: Negative for cough and shortness of breath.   Cardiovascular: Negative for chest pain and palpitations.  Gastrointestinal: Positive for abdominal pain and flatus. Negative for constipation, diarrhea, hematochezia, melena, nausea and vomiting.  Genitourinary: Negative for dysuria, frequency and hematuria.  Musculoskeletal: Negative for arthralgias and back pain.  Skin: Negative for color change and rash.  Neurological: Negative for seizures and syncope.  All other systems reviewed and are negative.    Physical Exam Updated Vital Signs BP 133/89   Pulse 72   Temp 98.2 F (36.8 C) (Oral)   Resp 20   Ht 5\' 2"  (1.575 m)   Wt 61.7 kg (136 lb)   LMP  (LMP Unknown)   SpO2 100%   BMI 24.87 kg/m   Physical Exam  Constitutional: She appears well-developed and well-nourished. No distress.  HENT:  Head: Normocephalic and atraumatic.  Eyes: Conjunctivae are normal.  Neck: Neck supple.  Cardiovascular: Normal rate and regular rhythm.  No murmur heard. Pulmonary/Chest: Effort normal and breath sounds normal. No respiratory distress.  Abdominal: Bowel sounds are normal. She exhibits distension. There is generalized tenderness. There is no rigidity and no guarding. No hernia.  Musculoskeletal: Normal range of motion. She exhibits no edema, tenderness or deformity.  Neurological: She is alert.  Skin: Skin is warm and dry. Capillary refill takes less than 2 seconds.  Psychiatric: She has a normal mood and affect.  Nursing note and vitals reviewed.    ED Treatments / Results  Labs (all labs ordered are listed, but only abnormal results are displayed) Labs Reviewed  COMPREHENSIVE METABOLIC PANEL - Abnormal; Notable for the following components:      Result Value   Glucose, Bld 106 (*)    All other components within normal limits  URINALYSIS, ROUTINE W REFLEX  MICROSCOPIC - Abnormal; Notable for the following components:   Hgb urine dipstick MODERATE (*)  Squamous Epithelial / LPF 0-5 (*)    All other components within normal limits  LIPASE, BLOOD  CBC    EKG  EKG Interpretation None       Radiology Ct Abdomen Pelvis W Contrast  Result Date: 05/17/2017 CLINICAL DATA:  57 year old female with abdominal pain for 4 weeks. EXAM: CT ABDOMEN AND PELVIS WITH CONTRAST TECHNIQUE: Multidetector CT imaging of the abdomen and pelvis was performed using the standard protocol following bolus administration of intravenous contrast. CONTRAST:  113mL ISOVUE-300 IOPAMIDOL (ISOVUE-300) INJECTION 61% COMPARISON:  01/09/2017 CT and prior studies FINDINGS: Lower chest: Mild bibasilar atelectasis/scarring noted. Hepatobiliary: The liver and gallbladder are unremarkable. No biliary dilatation. Pancreas: Unremarkable Spleen: Unremarkable Adrenals/Urinary Tract: The kidneys, adrenal glands and bladder are unremarkable except for RIGHT renal cysts. Stomach/Bowel: Stomach is within normal limits. No evidence of bowel wall thickening, distention, or inflammatory changes. Vascular/Lymphatic: Aortic atherosclerosis. No enlarged abdominal or pelvic lymph nodes. Reproductive: Status post hysterectomy. No adnexal masses. Other: No ascites, focal collection or pneumoperitoneum. Musculoskeletal: No acute bony abnormalities or suspicious bony lesions. IMPRESSION: 1. No evidence of acute or significant abnormality to suggest a cause for this patient's abdominal pain. 2.  Aortic Atherosclerosis (ICD10-I70.0). Electronically Signed   By: Margarette Canada M.D.   On: 05/17/2017 14:52    Procedures Procedures (including critical care time)  Medications Ordered in ED Medications  sodium chloride 0.9 % bolus 500 mL (not administered)  morphine 4 MG/ML injection 4 mg (not administered)  ondansetron (ZOFRAN) injection 4 mg (not administered)     Initial Impression / Assessment and Plan / ED  Course  I have reviewed the triage vital signs and the nursing notes.  Pertinent labs & imaging results that were available during my care of the patient were reviewed by me and considered in my medical decision making (see chart for details).  Clinical Course as of May 18 1056  Fri May 17, 2017  1514 Patient's workup has been unremarkable and her CT is read as negative by radiology.  As I review the images there is definitely a lot of stool and that may explain her symptoms.  I reviewed the findings with her and her husband and although she still appears uncomfortable with a would like to go home and I will prescribe him a laxative.  They have seen Dr. Fuller Plan as a GI doctor for colonoscopy so I am also recommending that she call their office for reevaluation.  [MB]    Clinical Course User Index [MB] Hayden Rasmussen, MD      Final Clinical Impressions(s) / ED Diagnoses   Final diagnoses:  Generalized abdominal pain    ED Discharge Orders        Ordered    polyethylene glycol [facility]) packet  Daily     05/17/17 1517       Hayden Rasmussen, MD 05/18/17 1059

## 2017-05-17 NOTE — Discharge Instructions (Signed)
Your evaluated in the emergency department for worsening abdominal distention and pain.  Your lab work and CT did not show an obvious explanation for your symptoms.  We are prescribing you a laxative to try and recommend that you follow-up with your primary care doctor and your GI doctor.  Please return if any worsening symptoms.

## 2017-05-23 ENCOUNTER — Ambulatory Visit: Payer: 59 | Admitting: Family Medicine

## 2017-05-23 ENCOUNTER — Other Ambulatory Visit: Payer: Self-pay | Admitting: Family Medicine

## 2017-07-02 ENCOUNTER — Encounter: Payer: Self-pay | Admitting: Gastroenterology

## 2017-07-02 ENCOUNTER — Ambulatory Visit (INDEPENDENT_AMBULATORY_CARE_PROVIDER_SITE_OTHER): Payer: 59 | Admitting: Gastroenterology

## 2017-07-02 VITALS — BP 120/82 | HR 84 | Ht 62.0 in | Wt 137.2 lb

## 2017-07-02 DIAGNOSIS — R109 Unspecified abdominal pain: Secondary | ICD-10-CM | POA: Diagnosis not present

## 2017-07-02 DIAGNOSIS — G8929 Other chronic pain: Secondary | ICD-10-CM | POA: Diagnosis not present

## 2017-07-02 DIAGNOSIS — R1031 Right lower quadrant pain: Secondary | ICD-10-CM

## 2017-07-02 DIAGNOSIS — M549 Dorsalgia, unspecified: Secondary | ICD-10-CM | POA: Diagnosis not present

## 2017-07-02 NOTE — Progress Notes (Signed)
    History of Present Illness: This is a 57 year old female with recurrent right back pain, right flank pain, right lower abdominal pain.  She has had an extensive evaluation over the past 2 years including: Abdominal/pelvic CTs in October 2018 and again in March 2019, both were unremarkable.   Abdominal ultrasound in February 2019 showing a 2 mm gallbladder polyp and a 4.3 cm upper pole right renal cyst.   Pelvic ultrasound in August 2018 showed surgically absent uterus and neither ovary was visualized.   Colonoscopy in May 2017 showed 3 small polyps, prior polypectomy site, prior tattoos and internal hemorrhoids. Her symptoms wax and wane. When they are bothersome her symptoms are exacerbated by movement and relieved by certain positions as well as relieved with a hot shower.  Dicyclomine was not effective in helping her symptoms.  Her symptoms are not associated with meals or bowel movements and do not change with bowel movements or meals.  She was recently prescribed Robaxin however she has not yet obtain the medication from her pharmacy.  Current Medications, Allergies, Past Medical History, Past Surgical History, Family History and Social History were reviewed in [employer] record.  Physical Exam: General: Well developed, well nourished, uncomfortable, no acute distress Head: Normocephalic and atraumatic Eyes:  sclerae anicteric, EOMI Ears: Normal auditory acuity Mouth: No deformity or lesions Lungs: Clear throughout to auscultation Heart: Regular rate and rhythm; no murmurs, rubs or bruits Abdomen: Soft, tenderness over right abdomen, right flank and midly distended. No masses, hepatosplenomegaly or hernias noted. Normal Bowel sounds Back: Tenderness over right lower back Rectal: Not done  Musculoskeletal: Symmetrical with no gross deformities  Pulses:  Normal pulses noted Extremities: No clubbing, cyanosis, edema or deformities noted Neurological: Alert  oriented x 4, grossly nonfocal Psychological:  Alert and cooperative. Normal mood and affect  Assessment and Recommendations:  1. Recurrent right flank pain, right back pain, right sided abdominal wall pain.  Symptoms appear to be musculoskeletal symptoms.  Flank and abdominal pain are abdominal wall pain. No underlying gastrointestinal cause has been found. Start Robaxin 500 mg tid today as prescribed, ibuprofen 600 mg tid and tylenol ES tid all for the next 5-7 days. Heating pad prn. Pain clinic referral and follow up with Dr. Charlett Blake.

## 2017-07-02 NOTE — Patient Instructions (Signed)
[facility] with an appointment date and time.   You can take over the counter ibuprofen 200 mg 3 capsules by mouth three times a day.  You can also take Tylenol extra strength three times a day. Take these medication x 5 days and follow up with your primary care physician.  Thank you for choosing me and [facility].  Pricilla Riffle. Dagoberto Ligas., MD., [facility] Regal

## 2017-07-05 ENCOUNTER — Other Ambulatory Visit: Payer: Self-pay | Admitting: Family Medicine

## 2017-07-08 ENCOUNTER — Telehealth: Payer: Self-pay

## 2017-07-08 NOTE — Telephone Encounter (Signed)
Received fax referral confirmation from [facility] that patient is scheduled to see their [facility] clinic on 07/15/17 at 3:40pm. Patient is aware of appt per their office.

## 2017-08-02 NOTE — Telephone Encounter (Signed)
Received fax from [facility] that patient cancelled appointment on 07/15/17 and does not want to reschedule.

## 2017-10-11 ENCOUNTER — Other Ambulatory Visit: Payer: Self-pay | Admitting: Family Medicine

## 2017-11-07 ENCOUNTER — Encounter: Payer: Self-pay | Admitting: Medical

## 2017-11-07 ENCOUNTER — Ambulatory Visit (INDEPENDENT_AMBULATORY_CARE_PROVIDER_SITE_OTHER): Payer: Self-pay | Admitting: Medical

## 2017-11-07 VITALS — BP 157/76 | HR 65 | Temp 98.1°F | Resp 16 | Ht 62.0 in | Wt 130.0 lb

## 2017-11-07 DIAGNOSIS — M5410 Radiculopathy, site unspecified: Secondary | ICD-10-CM

## 2017-11-07 DIAGNOSIS — R059 Cough, unspecified: Secondary | ICD-10-CM

## 2017-11-07 DIAGNOSIS — R05 Cough: Secondary | ICD-10-CM

## 2017-11-07 DIAGNOSIS — M542 Cervicalgia: Secondary | ICD-10-CM

## 2017-11-07 DIAGNOSIS — M5442 Lumbago with sciatica, left side: Secondary | ICD-10-CM

## 2017-11-07 MED ORDER — BUSPIRONE HCL 7.5 MG PO TABS: 7.5000 mg | ORAL_TABLET | Freq: Three times a day (TID) | ORAL | 1 refills | Status: DC

## 2017-11-07 MED ORDER — AMLODIPINE BESYLATE 2.5 MG PO TABS: 2.5000 mg | ORAL_TABLET | Freq: Two times a day (BID) | ORAL | 3 refills | Status: DC

## 2017-11-07 MED ORDER — PREDNISONE 10 MG PO TABS: ORAL_TABLET | ORAL | 0 refills | Status: DC

## 2017-11-07 MED ORDER — CITALOPRAM HYDROBROMIDE 20 MG PO TABS: 20.0000 mg | ORAL_TABLET | Freq: Every day | ORAL | 1 refills | Status: DC

## 2017-11-07 MED ORDER — METOPROLOL TARTRATE 75 MG PO TABS: 75.0000 mg | ORAL_TABLET | Freq: Two times a day (BID) | ORAL | 3 refills | Status: DC

## 2017-11-07 MED ORDER — CYCLOBENZAPRINE HCL 10 MG PO TABS: 10.0000 mg | ORAL_TABLET | Freq: Every day | ORAL | 0 refills | Status: DC

## 2017-11-07 MED ORDER — KETOROLAC TROMETHAMINE 60 MG/2ML IM SOLN
60.0000 mg | Freq: Once | INTRAMUSCULAR | Status: AC
Start: 2017-11-07 — End: 2017-11-07
  Administered 2017-11-07: 60 mg via INTRAMUSCULAR

## 2017-11-07 MED ORDER — ATORVASTATIN CALCIUM 10 MG PO TABS: 10.0000 mg | ORAL_TABLET | Freq: Every day | ORAL | 1 refills | Status: DC

## 2017-11-07 MED ORDER — QUETIAPINE FUMARATE 300 MG PO TABS: 300.0000 mg | ORAL_TABLET | Freq: Every day | ORAL | 1 refills | Status: DC

## 2017-11-07 MED ORDER — LEVETIRACETAM 500 MG PO TABS: ORAL_TABLET | ORAL | 0 refills | Status: DC

## 2017-11-07 MED ORDER — AZELASTINE HCL 0.15 % NA SOLN: 2.0000 | Freq: Two times a day (BID) | NASAL | 2 refills | Status: DC

## 2017-11-07 MED ORDER — CYCLOBENZAPRINE HCL 10 MG PO TABS: 10.0000 mg | ORAL_TABLET | Freq: Three times a day (TID) | ORAL | 1 refills | Status: DC | PRN

## 2017-11-07 NOTE — Progress Notes (Signed)
Subjective:    Patient ID: Ruth Bell, female    DOB: [birth year], 57 y.o.   MRN: [id number]  HPI   Pt states she had 5 weeks of back pain. Just states started hurting. No fall or trauma prior to the fall. Pt states pain is worse at night but present all day. Low level pain in morning but by afternoon pain is severe and difficult to get comfortable. Pt states her leg has been hurting. Describes pain that shoot. Occaionaly left leg feel weak like will give out. Pt states years of sneezing and coughing that causes some incontinence. This is not more worse than usual. Pt states she dropped spoon. Tried to pick up spoon and she felt like back locked up. Husband massage area and loosened back up.  Also some upper neck pain over past 5 weeks. Some pain that shoots to her left shoulder.   On review pt does not have diabetes.      Review of Systems  Constitutional: Negative for chills, fatigue and fever.  Respiratory: Negative for cough, chest tightness, shortness of breath and wheezing.   Cardiovascular: Negative for chest pain and palpitations.  Gastrointestinal: Negative for abdominal pain.  Musculoskeletal: Positive for back pain and neck pain. Negative for myalgias.  Skin: Negative for rash.  Neurological: Negative for dizziness, speech difficulty, weakness, numbness and headaches.       Some radicular pain  Hematological: Negative for adenopathy. Does not bruise/bleed easily.  Psychiatric/Behavioral: Negative for behavioral problems. The patient is not nervous/anxious.     Past Medical History:  Diagnosis Date  . Anemia   . Anxiety   . Arm skin lesion, left 09/05/2014  . Constipation 12/27/2013  . Depression   . Dysphagia 04/15/2016  . Eczema 08/22/2015  . GERD (gastroesophageal reflux disease)   . Hematuria 03/04/2016  . History of shingles 01/22/2016  . Hyperglycemia 01/15/2015  . Hyperlipidemia, mixed 08/22/2015  . Hypertension   . Menometrorrhagia 2006  . Migraine,  unspecified, without mention of intractable migraine without mention of status migrainosus   . Preventative health care 11/04/2016  . Pseudoseizures   . Seizures ([place])    history of pseudoseizure disorder, last last seizure 3 wks, keppra inc in dosage  . Tubular adenoma of colon 05/2011  . Uterine leiomyoma 2006     Social History   Socioeconomic History  . Marital status: Married    Spouse name: Gwyndolyn Saxon  . Number of children: 2  . Years of education: Not on file  . Highest education level: Not on file  Occupational History  . Occupation: CMA-retired  Social Needs  . Financial resource strain: Not on file  . Food insecurity:    Worry: Not on file    Inability: Not on file  . Transportation needs:    Medical: Not on file    Non-medical: Not on file  Tobacco Use  . Smoking status: Light Tobacco Smoker    Packs/day: 0.50    Years: 40.00    Pack years: 20.00    Types: Cigarettes    Start date: 12/18/2015  . Smokeless tobacco: Never Used  . Tobacco comment: 3-4 cigarettes daily  Substance and Sexual Activity  . Alcohol use: Yes  . Drug use: No  . Sexual activity: Not on file    Comment: lives with husband, no dietary restrictions.   Lifestyle  . Physical activity:    Days per week: Not on file    Minutes per session: Not on file  .  Stress: Not on file  Relationships  . Social connections:    Talks on phone: Not on file    Gets together: Not on file    Attends religious service: Not on file    Active member of club or organization: Not on file    Attends meetings of clubs or organizations: Not on file    Relationship status: Not on file  . Intimate partner violence:    Fear of current or ex partner: Not on file    Emotionally abused: Not on file    Physically abused: Not on file    Forced sexual activity: Not on file  Other Topics Concern  . Not on file  Social History Narrative   Lives with her husband and their son.   Daughter lives in [place], [place].    Past  Surgical History:  Procedure Laterality Date  . ABDOMINAL HYSTERECTOMY  04/06/04  . APPENDECTOMY    . BILATERAL SALPINGOOPHORECTOMY  04/06/04  . MASS EXCISION Left 06/02/2015   Procedure: EXCISION LEFT ARM MASS;  Surgeon: Autumn Messing III, MD;  Location: [facility];  Service: General;  Laterality: Left;  . ROTATOR CUFF REPAIR      Family History  Problem Relation Age of Onset  . Heart disease Mother   . Heart attack Mother   . Cancer Father        colon  . Hypertension Father   . Cancer Sister        brain  . Cancer Brother   . Hypertension Sister   . Cancer Sister        unsure of type  . Diabetes Sister        type 2  . Heart attack Sister   . Hypertension Brother   . Colon cancer Neg Hx     No Known Allergies  Current Outpatient Medications on File Prior to Visit  Medication Sig Dispense Refill  . baclofen (LIORESAL) 10 MG tablet TAKE 1/2 TABLET BY MOUTH 3 TIMES A DAY  4  . famciclovir (FAMVIR) 500 MG tablet Take 1 tablet (500 mg total) by mouth 3 (three) times daily. 21 tablet 0  . gabapentin (NEURONTIN) 600 MG tablet TAKE 1 TABLET BY MOUTH 3  TIMES A DAY 180 tablet 0  . metoprolol tartrate (LOPRESSOR) 50 MG tablet Take 1 tablet (50 mg total) by mouth 2 (two) times daily. 120 tablet 4  . ondansetron (ZOFRAN ODT) 4 MG disintegrating tablet Take 1 tablet (4 mg total) by mouth every 8 (eight) hours as needed for nausea or vomiting. 20 tablet 0  . pantoprazole (PROTONIX) 40 MG tablet TAKE 1 TABLET BY MOUTH  DAILY 90 tablet 0  . polyethylene glycol (MIRALAX) packet Take 17 g by mouth daily. 14 each 0  . ranitidine (ZANTAC) 150 MG tablet Take 1 tablet (150 mg total) by mouth 2 (two) times daily. 180 tablet 2  . triamcinolone cream (KENALOG) 0.1 % APPLY 1 APPLICATION TOPICALLY 2 (TWO) TIMES DAILY. 45 g 0   No current facility-administered medications on file prior to visit.     BP (!) 157/76   Pulse 65   Temp 98.1 F (36.7 C) (Oral)   Resp 16   Ht 5\' 2"   (1.575 m)   Wt 130 lb (59 kg)   LMP  (LMP Unknown)   SpO2 100%   BMI 23.78 kg/m       Objective:   Physical Exam   General Mental Status- Alert. General Appearance-  Not in acute distress.   Skin General: Color- Normal Color. Moisture- Normal Moisture.  Neck Carotid Arteries- Normal color. Moisture- Normal Moisture. No carotid bruits. No JVD. Mid spinal pain on palpation as well as left-sided trapezius pain on palpation.  Chest and Lung Exam Auscultation: Breath Sounds:-Normal.  Cardiovascular Auscultation:Rythm- Regular. Murmurs & Other Heart Sounds:Auscultation of the heart reveals- No Murmurs.  Abdomen Inspection:-Inspeection Normal. Palpation/Percussion:Note:No mass. Palpation and Percussion of the abdomen reveal- Non Tender, Non Distended + BS, no rebound or guarding.  Back- mid lumbar tenderness to palpation.  Left eye tenderness to palpation as well.  Pain on straight leg less increases radicular pain.    Neurologic Cranial Nerve exam:- CN III-XII intact(No nystagmus), symmetric smile. Strength:-Right upper extremity and lower extremity strength 5 out of 5.  Left upper extremity and lower extremity strength is about 3 out of 5.  However patient seems to be in increased pain on attempted strength demonstration on both sides.  Patient has gross motor and sensory function intact on both upper and lower extremity each side.     Assessment & Plan:  For both your neck pain and lumbar pain along with radicular symptoms, I did place x-rays of both regions.  Will assess the disc spaces and regions where the nerves exit the spine.  We gave you Toradol 60 mg injection today and I prescribed Flexeril 10 mg tablets to use at night.  Starting tomorrow afternoon you can begin the 6-day taper dose of prednisone.  We will follow x-ray results and if your symptoms persist despite the above measures then will consider MRI imaging studies.  Reviewed signs and symptoms that would  indicate need to be seen in the emergency department.  Continue with gabapentin as I can help with nerve pain.  Follow-up in 7 days with PCP or as needed.  Note did refill patient's chronic medications and took off meloxicam, Zanaflex and Toradol all of her medications.

## 2017-11-07 NOTE — Patient Instructions (Addendum)
For both your neck pain and lumbar pain along with radicular symptoms, I did place x-rays of both regions.  Will assess the disc spaces and regions where the nerves exit the spine.  We gave you Toradol 60 mg injection today and I prescribed Flexeril 10 mg tablets to use at night.  Starting tomorrow afternoon you can begin the 6-day taper dose of prednisone.  We will follow x-ray results and if your symptoms persist despite the above measures then will consider MRI imaging studies.  Reviewed signs and symptoms that would indicate need to be seen in the emergency department.  Continue with gabapentin as I can help with nerve pain.  Follow-up in 7 days with PCP or as needed.

## 2017-11-25 ENCOUNTER — Ambulatory Visit (HOSPITAL_BASED_OUTPATIENT_CLINIC_OR_DEPARTMENT_OTHER)
Admission: RE | Admit: 2017-11-25 | Discharge: 2017-11-25 | Disposition: A | Discharge status: Home / Self Care | Payer: PRIVATE HEALTH INSURANCE | Source: Ambulatory Visit | Attending: Medical | Admitting: Medical

## 2017-11-25 ENCOUNTER — Other Ambulatory Visit: Payer: Self-pay | Admitting: Family Medicine

## 2017-11-25 DIAGNOSIS — M47812 Spondylosis without myelopathy or radiculopathy, cervical region: Secondary | ICD-10-CM | POA: Diagnosis not present

## 2017-11-25 DIAGNOSIS — M542 Cervicalgia: Secondary | ICD-10-CM | POA: Diagnosis not present

## 2017-11-25 DIAGNOSIS — M5442 Lumbago with sciatica, left side: Secondary | ICD-10-CM

## 2017-11-27 ENCOUNTER — Other Ambulatory Visit: Payer: Self-pay | Admitting: Family Medicine

## 2017-11-27 DIAGNOSIS — M542 Cervicalgia: Secondary | ICD-10-CM

## 2017-12-04 ENCOUNTER — Ambulatory Visit (INDEPENDENT_AMBULATORY_CARE_PROVIDER_SITE_OTHER): Payer: PRIVATE HEALTH INSURANCE | Admitting: Family Medicine

## 2017-12-04 ENCOUNTER — Encounter: Payer: Self-pay | Admitting: Family Medicine

## 2017-12-04 VITALS — BP 153/101 | HR 67 | Ht 62.0 in | Wt 123.0 lb

## 2017-12-04 DIAGNOSIS — M5010 Cervical disc disorder with radiculopathy, unspecified cervical region: Secondary | ICD-10-CM | POA: Diagnosis not present

## 2017-12-04 MED ORDER — HYDROCODONE-ACETAMINOPHEN 5-325 MG PO TABS: 1.0000 | ORAL_TABLET | Freq: Four times a day (QID) | ORAL | 0 refills | Status: DC | PRN

## 2017-12-04 MED ORDER — DICLOFENAC SODIUM 75 MG PO TBEC: 75.0000 mg | DELAYED_RELEASE_TABLET | Freq: Two times a day (BID) | ORAL | 1 refills | Status: DC

## 2017-12-04 MED ORDER — TIZANIDINE HCL 4 MG PO TABS: 4.0000 mg | ORAL_TABLET | Freq: Four times a day (QID) | ORAL | 1 refills | Status: DC | PRN

## 2017-12-04 NOTE — Patient Instructions (Signed)
You have cervical radiculopathy (a pinched nerve in the neck). Diclofenac 75mg  twice a day with food for pain and inflammation - start day AFTER finishing prednisone. Tizanidine three times a day as needed for muscle spasms (do not drive with this if it makes you sleepy). Norco as needed for severe pain (no driving on this medicine). Consider cervical collar if severely painful. Simple range of motion exercises within limits of pain to prevent further stiffness. Consider physical therapy for stretching, exercises, traction, and modalities. Heat 15 minutes at a time 3-4 times a day to help with spasms. Watch head position when on computers, texting, when sleeping in bed - should in line with back to prevent further nerve traction and irritation. Consider home traction unit if you get benefit with this in physical therapy. We will go ahead with an MRI of your cervical spine to further assess.

## 2017-12-04 NOTE — Progress Notes (Signed)
   Subjective:    Patient ID: Ruth Bell, female    DOB: 1961/02/13, 57 y.o.   MRN: [id number]  PCP and consultation requested by Dr. Charlett Blake  HPI 57 year old female who presents for Cervical spine and neck pain. She states that around 5 weeks ago she woke up with a tremendous amount of pain in her left neck. The pain is rated a 9/10. She states that she routinely feels numbness, tingling, and pins/needles in her fingers and hand with numbness. She feels like her strength is significantly affected by her pain. The pain is waking her up at night. She feels like elevating her head does make the pain improve. She describes wishing that someone could just lift her head up all the time. She has been given a toradol injection and given flexeril 10mg  tablets. She was also given a 6 day course of prednisone. These have not helped her at all.  No skin changes.  Xray from 11/25/17 showed mild degenerative change without acute abnormality.   Review of Systems  Review of systems per hpi  Reviewed past medical history, past surgical history, allergies, social history, and medications     Objective:   Physical Exam Gen: in noticeable pain, elderly AA female, in no acute distress Cards: warm, well perfused. Pulm: no increased work of breathing, no accessory muscle use Neuro: no focal neurological deficit, cn 2-12 intact. Able to ambulate, no gait abnormality. Sensation intact LUE. Positive spurlin's test  MSK: No gross deformity, swelling, bruising. TTP left neck. No midline/bony TTP. Unable to rotate neck past midline to left 3/5 strength finger abduction, extension, thumb opposition 4/5 strength bicep and tricep   5/5 strength wrist extension. Sensation intact to light touch.   2+ equal reflexes in triceps, biceps, brachioradialis tendons. Negative spurlings. NV intact distal BUEs.     Assessment & Plan:  57 year old who who presents with intractable neck pain and decreased strength on LUE.  Symptoms consistent with severe cervical radiculopathy.  Not improving with conservative treatment including prednisone, toradol.  Cervical spine radiographs independently reviewed and no acute findings.  Will go ahead with MRI given lack of improvement and gross weakness left upper extremity.  In meantime diclofenac with tizanidine and norco as needed.  Heat for spasms.  Ergonomic issues discussed.

## 2017-12-05 NOTE — Addendum Note (Signed)
Addended by: Sherrie George F on: 12/05/2017 01:18 PM   Modules accepted: Orders

## 2017-12-08 ENCOUNTER — Other Ambulatory Visit: Payer: Self-pay | Admitting: Family Medicine

## 2018-12-13 ENCOUNTER — Emergency Department (HOSPITAL_COMMUNITY): Payer: BLUE CROSS/BLUE SHIELD

## 2018-12-13 ENCOUNTER — Emergency Department (HOSPITAL_COMMUNITY)
Admission: EM | Admit: 2018-12-13 | Discharge: 2018-12-13 | Disposition: A | Discharge status: Home / Self Care | Payer: BLUE CROSS/BLUE SHIELD | Attending: Emergency Medicine | Admitting: Emergency Medicine

## 2018-12-13 ENCOUNTER — Other Ambulatory Visit: Payer: Self-pay

## 2018-12-13 DIAGNOSIS — S52532A Colles' fracture of left radius, initial encounter for closed fracture: Secondary | ICD-10-CM | POA: Diagnosis not present

## 2018-12-13 DIAGNOSIS — Y929 Unspecified place or not applicable: Secondary | ICD-10-CM | POA: Diagnosis not present

## 2018-12-13 DIAGNOSIS — W19XXXA Unspecified fall, initial encounter: Secondary | ICD-10-CM | POA: Insufficient documentation

## 2018-12-13 DIAGNOSIS — Z79899 Other long term (current) drug therapy: Secondary | ICD-10-CM | POA: Diagnosis not present

## 2018-12-13 DIAGNOSIS — F17210 Nicotine dependence, cigarettes, uncomplicated: Secondary | ICD-10-CM | POA: Diagnosis not present

## 2018-12-13 DIAGNOSIS — I10 Essential (primary) hypertension: Secondary | ICD-10-CM | POA: Diagnosis not present

## 2018-12-13 DIAGNOSIS — Y93K9 Activity, other involving animal care: Secondary | ICD-10-CM | POA: Diagnosis not present

## 2018-12-13 DIAGNOSIS — S59912A Unspecified injury of left forearm, initial encounter: Secondary | ICD-10-CM | POA: Diagnosis present

## 2018-12-13 DIAGNOSIS — Y999 Unspecified external cause status: Secondary | ICD-10-CM | POA: Insufficient documentation

## 2018-12-13 MED ORDER — OXYCODONE-ACETAMINOPHEN 5-325 MG PO TABS
2.0000 | ORAL_TABLET | Freq: Once | ORAL | Status: AC
  Administered 2018-12-13: 2 via ORAL
  Filled 2018-12-13: qty 2

## 2018-12-13 MED ORDER — HYDROMORPHONE HCL 1 MG/ML IJ SOLN: 1.0000 mg | Freq: Once | INTRAMUSCULAR | Status: DC

## 2018-12-13 MED ORDER — OXYCODONE HCL 5 MG PO TABS: 5.0000 mg | ORAL_TABLET | ORAL | 0 refills | Status: DC | PRN

## 2018-12-13 MED ORDER — LIDOCAINE HCL (PF) 1 % IJ SOLN
20.0000 mL | Freq: Once | INTRAMUSCULAR | Status: DC
  Filled 2018-12-13: qty 20

## 2018-12-13 MED ORDER — OXYCODONE HCL 5 MG PO TABS: 5.0000 mg | ORAL_TABLET | ORAL | 0 refills | Status: AC | PRN

## 2018-12-13 MED ORDER — FENTANYL CITRATE (PF) 100 MCG/2ML IJ SOLN
100.0000 ug | Freq: Once | INTRAMUSCULAR | Status: AC
  Administered 2018-12-13: 50 ug via INTRAVENOUS
  Filled 2018-12-13: qty 2

## 2018-12-13 NOTE — ED Notes (Signed)
Patient transported to X-ray 

## 2018-12-13 NOTE — ED Notes (Signed)
Patient verbalizes understanding of discharge instructions. Opportunity for questioning and answers were provided. Armband removed by staff, pt discharged from ED.  

## 2018-12-13 NOTE — ED Notes (Signed)
Ortho tech at bedside 

## 2018-12-13 NOTE — ED Notes (Signed)
Wasted 8mcg 56ml fentanyl with Norvel Richards, RN.  Full dose not given, EDP aware and in agreement.

## 2018-12-13 NOTE — ED Notes (Signed)
Called h/s Dr Fredna Dow to Dr Sabra Heck

## 2018-12-13 NOTE — Progress Notes (Signed)
Orthopedic Tech Progress Note Patient Details:  Ruth Bell 1960-06-29 [id number]  Ortho Devices Type of Ortho Device: Sugartong splint, Arm sling Ortho Device/Splint Interventions: Adjustment, Application, Ordered   Post Interventions Patient Tolerated: Fair Instructions Provided: Care of device   Melony Overly T 12/13/2018, 8:28 PM

## 2018-12-13 NOTE — Discharge Instructions (Addendum)
You have been diagnosed with a fracture of your left forearm.  Have been placed in a splint today and will continue wear this until you are seen by orthopedic surgery. PLEASE CALL DR. Fredna Dow ON Monday to make an appointment.  You are being discharged with oxycodone.  Please use this for pain as prescribed. This medication can make you drowsy. Do not drink alcohol or use any recreational drugs. Do not operate heavy machinery or drive while using.

## 2018-12-13 NOTE — ED Notes (Signed)
Witnessed 50 mcg of fentanyl wasted.

## 2018-12-13 NOTE — ED Provider Notes (Addendum)
[facility] DEPART[facility] Provider Note   CSN: [id number] Arrival date & time: 12/13/18  1808     History   Chief Complaint Chief Complaint  Patient presents with  . Arm Pain    HPI Ruth Bell is a 58 y.o. female with history of seizure, HLD, HTN, and migraine presents today for acute onset of persistent left wrist, elbow and shoulder pain after fall; pain is 10/10 and  worse with movement and touch and better with ice.   Patient fell around 6:15 pm tonight when her dog tripped her causing her to fall to the wooden floor from standing. She attempted to catch herself with her arms and in the process her left hand caught most of her weight. Patient denies any head injury or LOC.       HPI  Past Medical History:  Diagnosis Date  . Anemia   . Anxiety   . Arm skin lesion, left 09/05/2014  . Constipation 12/27/2013  . Depression   . Dysphagia 04/15/2016  . Eczema 08/22/2015  . GERD (gastroesophageal reflux disease)   . Hematuria 03/04/2016  . History of shingles 01/22/2016  . Hyperglycemia 01/15/2015  . Hyperlipidemia, mixed 08/22/2015  . Hypertension   . Menometrorrhagia 2006  . Migraine, unspecified, without mention of intractable migraine without mention of status migrainosus   . Preventative health care 11/04/2016  . Pseudoseizures   . Seizures ([place])    history of pseudoseizure disorder, last last seizure 3 wks, keppra inc in dosage  . Tubular adenoma of colon 05/2011  . Uterine leiomyoma 2006    Patient Active Problem List   Diagnosis Date Noted  . Urinary frequency 05/07/2017  . Abdominal bloating 05/07/2017  . Back pain 03/21/2017  . Tachycardia 02/28/2017  . Preventative health care 11/04/2016  . Hoarseness 04/15/2016  . Dysphagia 04/15/2016  . Anterior chest wall pain 04/13/2016  . Dysuria 03/04/2016  . Hematuria 03/04/2016  . Palpitations 01/30/2016  . History of shingles 01/22/2016  . Eczema 08/22/2015  . Hyperlipidemia, mixed  08/22/2015  . Hyperglycemia 01/15/2015  . Lumbar radiculopathy 11/19/2014  . Arm skin lesion, left 09/05/2014  . Noncompliance with medications 08/11/2014  . Constipation 12/27/2013  . Pain in joint, shoulder region 10/13/2013  . Shoulder pain 09/23/2013  . Numbness and tingling in right hand 09/23/2013  . Jaw pain 03/26/2013  . Neck pain 05/17/2012  . Seizure disorder ([place]) 02/21/2012  . Non compliance w medication regimen 02/21/2012  . Essential hypertension 09/06/2011  . Abdominal pain 06/20/2011  . Hemorrhoids 06/20/2011  . Hx of adenomatous colonic polyps 06/13/2011  . Musculoskeletal pain 04/23/2011  . Radiculopathy of leg 02/02/2011  . History of pseudoseizure 07/10/2010  . Anxiety and depression 12/29/2009  . TINEA PEDIS 06/02/2009  . Exertional dyspnea 01/13/2008  . MICROSCOPIC HEMATURIA 06/24/2007  . WEIGHT LOSS 04/28/2007  . Anemia 03/10/2007  . Migraine 03/10/2007  . ARTHRITIS 03/10/2007  . SYNCOPE 03/10/2007    Past Surgical History:  Procedure Laterality Date  . ABDOMINAL HYSTERECTOMY  04/06/04  . APPENDECTOMY    . BILATERAL SALPINGOOPHORECTOMY  04/06/04  . MASS EXCISION Left 06/02/2015   Procedure: EXCISION LEFT ARM MASS;  Surgeon: Autumn Messing III, MD;  Location: [facility];  Service: General;  Laterality: Left;  . ROTATOR CUFF REPAIR       OB History   No obstetric history on file.      Home Medications    Prior to Admission medications  Medication Sig Start Date End Date Taking? Authorizing Provider  amLODipine (NORVASC) 2.5 MG tablet Take 1 tablet (2.5 mg total) by mouth 2 (two) times daily. 11/07/17   Saguier, Percell Miller, PA-C  atorvastatin (LIPITOR) 10 MG tablet TAKE 1 TABLET BY MOUTH  DAILY 11/26/17   Mosie Lukes, MD  Azelastine HCl 0.15 % SOLN Place 2 sprays into both nostrils 2 (two) times daily. 11/07/17   Saguier, Percell Miller, PA-C  baclofen (LIORESAL) 10 MG tablet TAKE 1/2 TABLET BY MOUTH 3 TIMES A DAY 03/18/16   [provider]  busPIRone (BUSPAR) 7.5 MG tablet Take 1 tablet (7.5 mg total) by mouth 3 (three) times daily. 11/07/17   Saguier, Percell Miller, PA-C  citalopram (CELEXA) 20 MG tablet Take 1 tablet (20 mg total) by mouth daily. 11/07/17   Saguier, Percell Miller, PA-C  cyclobenzaprine (FLEXERIL) 10 MG tablet Take 1 tablet (10 mg total) by mouth at bedtime. Please dc the 90 day rx with one refill. Only give 30 tabs. 11/07/17   Saguier, Percell Miller, PA-C  diclofenac (VOLTAREN) 75 MG EC tablet Take 1 tablet (75 mg total) by mouth 2 (two) times daily. 12/04/17   Hudnall, Sharyn Lull, MD  famciclovir (FAMVIR) 500 MG tablet Take 1 tablet (500 mg total) by mouth 3 (three) times daily. 05/07/17   Mosie Lukes, MD  gabapentin (NEURONTIN) 600 MG tablet TAKE 1 TABLET BY MOUTH 3  TIMES A DAY 05/23/17   Mosie Lukes, MD  HYDROcodone-acetaminophen (NORCO) 5-325 MG tablet Take 1 tablet by mouth every 6 (six) hours as needed for moderate pain. 12/04/17   Hudnall, Sharyn Lull, MD  levETIRAcetam (KEPPRA) 500 MG tablet TAKE 1 TABLET (500 MG TOTAL) BY MOUTH TWO (2) TIMES A DAY. 11/07/17   Saguier, Percell Miller, PA-C  metoprolol tartrate (LOPRESSOR) 50 MG tablet Take 1 tablet (50 mg total) by mouth 2 (two) times daily. 05/07/17   Mosie Lukes, MD  Metoprolol Tartrate 75 MG TABS Take 75 mg by mouth 2 (two) times daily. 11/07/17   Saguier, Percell Miller, PA-C  ondansetron (ZOFRAN ODT) 4 MG disintegrating tablet Take 1 tablet (4 mg total) by mouth every 8 (eight) hours as needed for nausea or vomiting. 02/21/16   Mosie Lukes, MD  oxyCODONE (ROXICODONE) 5 MG immediate release tablet Take 1 tablet (5 mg total) by mouth every 4 (four) hours as needed for up to 5 days for severe pain. 12/13/18 12/18/18  Tedd Sias, PA  pantoprazole (PROTONIX) 40 MG tablet Take 1 tablet (40 mg total) by mouth daily. 12/09/17   Mosie Lukes, MD  polyethylene glycol [facility]) packet Take 17 g by mouth daily. 05/17/17   Hayden Rasmussen, MD  predniSONE (DELTASONE) 10 MG tablet 6 TAB PO DAY 1 5  TAB PO DAY 2 4 TAB PO DAY 3 3 TAB PO DAY 4 2 TAB PO DAY 5 1 TAB PO DAY 6 11/07/17   Saguier, Percell Miller, PA-C  QUEtiapine (SEROQUEL) 300 MG tablet Take 1 tablet (300 mg total) by mouth at bedtime. 11/07/17   Saguier, Percell Miller, PA-C  ranitidine (ZANTAC) 150 MG tablet Take 1 tablet (150 mg total) by mouth 2 (two) times daily. 05/07/17   Mosie Lukes, MD  tiZANidine (ZANAFLEX) 4 MG tablet Take 1 tablet (4 mg total) by mouth every 6 (six) hours as needed. 12/04/17   Hudnall, Sharyn Lull, MD  triamcinolone cream (KENALOG) 0.1 % APPLY 1 APPLICATION TOPICALLY 2 (TWO) TIMES DAILY. 05/10/17   Mosie Lukes, MD  Family History Family History  Problem Relation Age of Onset  . Heart disease Mother   . Heart attack Mother   . Cancer Father        colon  . Hypertension Father   . Cancer Sister        brain  . Cancer Brother   . Hypertension Sister   . Cancer Sister        unsure of type  . Diabetes Sister        type 2  . Heart attack Sister   . Hypertension Brother   . Colon cancer Neg Hx     Social History Social History   Tobacco Use  . Smoking status: Light Tobacco Smoker    Packs/day: 0.50    Years: 40.00    Pack years: 20.00    Types: Cigarettes    Start date: 12/18/2015  . Smokeless tobacco: Never Used  . Tobacco comment: 3-4 cigarettes daily  Substance Use Topics  . Alcohol use: Yes  . Drug use: No     Allergies   Patient has no known allergies.   Review of Systems Review of Systems  Constitutional: Negative for chills and fever.  HENT: Negative for congestion.   Eyes: Negative for pain.  Respiratory: Negative for cough and shortness of breath.   Cardiovascular: Negative for chest pain and leg swelling.  Gastrointestinal: Negative for abdominal pain and vomiting.  Genitourinary: Negative for dysuria.  Musculoskeletal: Positive for joint swelling. Negative for myalgias and neck pain.  Skin: Negative for rash.  Neurological: Negative for dizziness and headaches.      Physical Exam Updated Vital Signs BP (!) 165/101   Pulse 80   Temp 97.9 F (36.6 C) (Oral)   Resp 16   Ht 5\' 2"  (1.575 m)   Wt 52.2 kg   LMP  (LMP Unknown)   SpO2 98%   BMI 21.03 kg/m   Physical Exam Vitals signs and nursing note reviewed.  Constitutional:      General: She is in acute distress.     Appearance: Normal appearance. She is not ill-appearing.  HENT:     Head: Normocephalic and atraumatic.     Mouth/Throat:     Mouth: Mucous membranes are moist.  Eyes:     General: No scleral icterus.       Right eye: No discharge.        Left eye: No discharge.     Conjunctiva/sclera: Conjunctivae normal.  Neck:     Musculoskeletal: Normal range of motion. No neck rigidity or muscular tenderness.  Cardiovascular:     Rate and Rhythm: Normal rate.  Pulmonary:     Effort: Pulmonary effort is normal.     Breath sounds: No stridor.  Musculoskeletal:        General: Swelling, tenderness, deformity and signs of injury present.     Right lower leg: No edema.     Left lower leg: No edema.     Comments: Swelling and deformity of the distal left forearm  TTP over distal wrist AC joint TTP  Unable to assess ROM of left arm secondary to pain in left wrist.   Skin:    General: Skin is warm and dry.     Capillary Refill: Capillary refill takes less than 2 seconds.     Comments: Skin is intact over the wrist. No rash or cellulitis present.   Neurological:     Mental Status: She is alert and oriented to person, place,  and time. Mental status is at baseline.     Sensory: No sensory deficit.     Comments: Left arm Sensation grossly intact and symmetric to uninjured arm Fingers able to actively flex and extend; unable to assess strength in wrist secondary to pain with movement.   Psychiatric:        Mood and Affect: Mood normal.        Behavior: Behavior normal.      ED Treatments / Results  Labs (all labs ordered are listed, but only abnormal results are displayed) Labs  Reviewed - No data to display  EKG None  Radiology Dg Elbow 2 Views Left  Result Date: 12/13/2018 CLINICAL DATA:  Fall with wrist deformity EXAM: LEFT ELBOW - 2 VIEW COMPARISON:  None. FINDINGS: There is no evidence of fracture, dislocation, or joint effusion. There is no evidence of arthropathy or other focal bone abnormality. Soft tissues are unremarkable. IMPRESSION: Negative. Electronically Signed   By: Donavan Foil M.D.   On: 12/13/2018 19:26   Dg Wrist Complete Left  Result Date: 12/13/2018 CLINICAL DATA:  Pt presents with left arm pain. pts dog ran in between her legs tripping her this afternoon. She landed on her Left arm on concrete. Obvious swelling and deformity to Left wrist. Best obtainable images. Pt unable to supinate her .*comment was truncated*injury EXAM: LEFT WRIST - COMPLETE 3+ VIEW COMPARISON:  None. FINDINGS: Fracture of the distal radius with dorsal angulation. The radiocarpal joint is intact. The distal fracture fragment appears comminuted. Fracture enters the articular surface. IMPRESSION: Intra-articular fracture of the distal radius with dorsal angulation. Radiocarpal joint intact.  Is Electronically Signed   By: Suzy Bouchard M.D.   On: 12/13/2018 19:28   Dg Shoulder Left  Result Date: 12/13/2018 CLINICAL DATA:  Fall with wrist deformity EXAM: LEFT SHOULDER - 2+ VIEW COMPARISON:  None. FINDINGS: There is no evidence of fracture or dislocation. There is no evidence of arthropathy or other focal bone abnormality. Soft tissues are unremarkable. IMPRESSION: Negative. Electronically Signed   By: Donavan Foil M.D.   On: 12/13/2018 19:27    Procedures Procedures (including critical care time)  Medications Ordered in ED Medications  fentaNYL (SUBLIMAZE) injection 100 mcg (50 mcg Intravenous Given 12/13/18 1954)  oxyCODONE-acetaminophen (PERCOCET/ROXICET) 5-325 MG per tablet 2 tablet (2 tablets Oral Given 12/13/18 2012)     Initial Impression / Assessment and Plan /  ED Course  I have reviewed the triage vital signs and the nursing notes.  Pertinent labs & imaging results that were available during my care of the patient were reviewed by me and considered in my medical decision making (see chart for details).       Patient 58 year old female presenting with left wrist pain after mechanical fall earlier today.  X-ray shows intra-articular and comminuted dorsally displaced distal radial fracture. Shoulder and elbow xrays are normal and without fx/dislocation.   Dr. Sabra Heck discussed patient case with Dr. Fredna Dow on call for hand surgery who recommended splint without reduction. Unable to administer IV fentanyl due to IV failure. Given oral pain medications before discharge and provided script for PO pain medications.    Final Clinical Impressions(s) / ED Diagnoses   Final diagnoses:  Closed Colles' fracture of left radius, initial encounter  Fall, initial encounter    ED Discharge Orders         Ordered    oxyCODONE (ROXICODONE) 5 MG immediate release tablet  Every 4 hours PRN,   Status:  Discontinued  12/13/18 2012    oxyCODONE (ROXICODONE) 5 MG immediate release tablet  Every 4 hours PRN     12/13/18 2119           Tedd Sias, [place] 12/13/18 2145    Noemi Chapel, MD 12/14/18 1523    Tedd Sias, [place] 12/14/18 1653    Noemi Chapel, MD 12/18/18 1946

## 2018-12-13 NOTE — ED Triage Notes (Signed)
Pt presents with Left arm pain. pts dog ran in between her legs tripping her. She landed on her Left arm on concrete. Obvious swelling and deformity to Left wrist noted in triage

## 2018-12-13 NOTE — ED Provider Notes (Signed)
58 year old female presents with complaint of left forearm injury after having a fall just prior to arrival.  On my exam the patient does have a deformity of the distal forearm on the left side, this is just proximal to the wrist.  She has on the x-ray what appears to be a comminuted intra-articular distal radial fracture, the scaphoid bone looks normal and the carpal bones look located.  I do not see any fractures of the elbow or obvious fractures of the clavicle or shoulder.  The patient has normal sensation and capillary refill although pulses are difficult to palpate given the location of the fracture.  Will discuss with hand surgery, likely need some type of reduction and splinting.  Patient agreeable  Dr. Fredna Dow has requested pt call Monday morning for follow up early n ext week - has recommended splint without reduction is OK given normal sensation / cap refill.  Medical screening examination/treatment/procedure(s) were conducted as a shared visit with non-physician practitioner(s) and myself.  I personally evaluated the patient during the encounter.  Clinical Impression:   Final diagnoses:  Closed Colles' fracture of left radius, initial encounter  Fall, initial encounter         Noemi Chapel, MD 12/14/18 [phone]

## 2018-12-14 ENCOUNTER — Telehealth: Payer: Self-pay | Admitting: *Deleted

## 2018-12-14 NOTE — Telephone Encounter (Signed)
TOC CM received call from pt stating her pharmacy is [facility] and that she cannot pick up meds until 12/16/2018. Explained to pt that she may need a prior auth. CM will not know until after speaking to pharmacist. States they will pick up at [facility] as the EDP would not be available to change pharmacy. Contacted [facility] and they do not have insurance info on file. Faxed pt's information to [facility] along with goodrx coupon $11, out of pocket cost is $22.90. [place], [place] ED TOC CM [phone]

## 2019-01-16 ENCOUNTER — Other Ambulatory Visit: Payer: Self-pay

## 2019-01-16 ENCOUNTER — Ambulatory Visit (INDEPENDENT_AMBULATORY_CARE_PROVIDER_SITE_OTHER): Payer: BLUE CROSS/BLUE SHIELD | Admitting: Family Medicine

## 2019-01-16 VITALS — BP 194/80 | Wt 100.0 lb

## 2019-01-16 DIAGNOSIS — R350 Frequency of micturition: Secondary | ICD-10-CM

## 2019-01-16 DIAGNOSIS — E782 Mixed hyperlipidemia: Secondary | ICD-10-CM

## 2019-01-16 DIAGNOSIS — G40909 Epilepsy, unspecified, not intractable, without status epilepticus: Secondary | ICD-10-CM

## 2019-01-16 DIAGNOSIS — M7918 Myalgia, other site: Secondary | ICD-10-CM

## 2019-01-16 DIAGNOSIS — I10 Essential (primary) hypertension: Secondary | ICD-10-CM

## 2019-01-16 DIAGNOSIS — G43909 Migraine, unspecified, not intractable, without status migrainosus: Secondary | ICD-10-CM

## 2019-01-16 DIAGNOSIS — R739 Hyperglycemia, unspecified: Secondary | ICD-10-CM

## 2019-01-16 MED ORDER — METOPROLOL TARTRATE 75 MG PO TABS: 75.0000 mg | ORAL_TABLET | Freq: Two times a day (BID) | ORAL | 1 refills | Status: DC

## 2019-01-16 MED ORDER — AMLODIPINE BESYLATE 2.5 MG PO TABS: 2.5000 mg | ORAL_TABLET | Freq: Two times a day (BID) | ORAL | 1 refills | Status: DC

## 2019-01-16 MED ORDER — LEVETIRACETAM 500 MG PO TABS: ORAL_TABLET | ORAL | 1 refills | Status: DC

## 2019-01-16 NOTE — Progress Notes (Signed)
She states she is in pain was seen in the ED on 9/26 she states she needs to start back taking her BP meds and her Keppra

## 2019-01-18 NOTE — Assessment & Plan Note (Signed)
Encouraged heart healthy diet, increase exercise, avoid trans fats, consider a krill oil cap daily. Tolerating Atorvastatin 

## 2019-01-18 NOTE — Assessment & Plan Note (Signed)
Is struggling with pain from a broken arm and is following with Dr Fredna Dow

## 2019-01-18 NOTE — Progress Notes (Signed)
Patient ID: AISLEIGH CHANG, female   DOB: 03-09-1961, 58 y.o.   MRN: [id number] Virtual Visit via phone Note  I connected with Ruth Bell on 01/16/19 at  9:00 AM EDT by a phone enabled telemedicine application and verified that I am speaking with the correct person using two identifiers.  Location: Patient: home Provider: home   I discussed the limitations of evaluation and management by telemedicine and the availability of in person appointments. The patient expressed understanding and agreed to proceed. Magdalene Molly, CMA was able to get the patient set up on a phone visit after being unable to complete a video visit    Subjective:    Patient ID: Ruth Bell, female    DOB: [birth year], 58 y.o.   MRN: [id number]  No chief complaint on file.   HPI Patient is in today for elevated blood pressure and seizure disorder. She notes her blood pressure was 194/80 at the orthopaedics office when she was there getting her broken arm looked at. She notes some recent seizure activity  With the elevated blood pressure. No recent febrile illness or hospitalizations. No polyuria or polydipsia. Denies CP/palp/SOB/HA/congestion/fevers/GI or GU c/o. Taking meds as prescribed   Past Medical History:  Diagnosis Date  . Anemia   . Anxiety   . Arm skin lesion, left 09/05/2014  . Constipation 12/27/2013  . Depression   . Dysphagia 04/15/2016  . Eczema 08/22/2015  . GERD (gastroesophageal reflux disease)   . Hematuria 03/04/2016  . History of shingles 01/22/2016  . Hyperglycemia 01/15/2015  . Hyperlipidemia, mixed 08/22/2015  . Hypertension   . Menometrorrhagia 2006  . Migraine, unspecified, without mention of intractable migraine without mention of status migrainosus   . Preventative health care 11/04/2016  . Pseudoseizures   . Seizures ([place])    history of pseudoseizure disorder, last last seizure 3 wks, keppra inc in dosage  . Tubular adenoma of colon 05/2011  . Uterine leiomyoma 2006    Past  Surgical History:  Procedure Laterality Date  . ABDOMINAL HYSTERECTOMY  04/06/04  . APPENDECTOMY    . BILATERAL SALPINGOOPHORECTOMY  04/06/04  . MASS EXCISION Left 06/02/2015   Procedure: EXCISION LEFT ARM MASS;  Surgeon: Autumn Messing III, MD;  Location: [facility];  Service: General;  Laterality: Left;  . ROTATOR CUFF REPAIR      Family History  Problem Relation Age of Onset  . Heart disease Mother   . Heart attack Mother   . Cancer Father        colon  . Hypertension Father   . Cancer Sister        brain  . Cancer Brother   . Hypertension Sister   . Cancer Sister        unsure of type  . Diabetes Sister        type 2  . Heart attack Sister   . Hypertension Brother   . Colon cancer Neg Hx     Social History   Socioeconomic History  . Marital status: Married    Spouse name: Gwyndolyn Saxon  . Number of children: 2  . Years of education: Not on file  . Highest education level: Not on file  Occupational History  . Occupation: CMA-retired  Social Needs  . Financial resource strain: Not on file  . Food insecurity    Worry: Not on file    Inability: Not on file  . Transportation needs    Medical: Not on file  Non-medical: Not on file  Tobacco Use  . Smoking status: Light Tobacco Smoker    Packs/day: 0.50    Years: 40.00    Pack years: 20.00    Types: Cigarettes    Start date: 12/18/2015  . Smokeless tobacco: Never Used  . Tobacco comment: 3-4 cigarettes daily  Substance and Sexual Activity  . Alcohol use: Yes  . Drug use: No  . Sexual activity: Not on file    Comment: lives with husband, no dietary restrictions.   Lifestyle  . Physical activity    Days per week: Not on file    Minutes per session: Not on file  . Stress: Not on file  Relationships  . Social Herbalist on phone: Not on file    Gets together: Not on file    Attends religious service: Not on file    Active member of club or organization: Not on file    Attends meetings of  clubs or organizations: Not on file    Relationship status: Not on file  . Intimate partner violence    Fear of current or ex partner: Not on file    Emotionally abused: Not on file    Physically abused: Not on file    Forced sexual activity: Not on file  Other Topics Concern  . Not on file  Social History Narrative   Lives with her husband and their son.   Daughter lives in [place], [place].    Outpatient Medications Prior to Visit  Medication Sig Dispense Refill  . atorvastatin (LIPITOR) 10 MG tablet TAKE 1 TABLET BY MOUTH  DAILY 90 tablet 1  . Azelastine HCl 0.15 % SOLN Place 2 sprays into both nostrils 2 (two) times daily. 30 mL 2  . baclofen (LIORESAL) 10 MG tablet TAKE 1/2 TABLET BY MOUTH 3 TIMES A DAY  4  . busPIRone (BUSPAR) 7.5 MG tablet Take 1 tablet (7.5 mg total) by mouth 3 (three) times daily. 90 tablet 1  . citalopram (CELEXA) 20 MG tablet Take 1 tablet (20 mg total) by mouth daily. 90 tablet 1  . cyclobenzaprine (FLEXERIL) 10 MG tablet Take 1 tablet (10 mg total) by mouth at bedtime. Please dc the 90 day rx with one refill. Only give 30 tabs. 30 tablet 0  . diclofenac (VOLTAREN) 75 MG EC tablet Take 1 tablet (75 mg total) by mouth 2 (two) times daily. 60 tablet 1  . famciclovir (FAMVIR) 500 MG tablet Take 1 tablet (500 mg total) by mouth 3 (three) times daily. 21 tablet 0  . gabapentin (NEURONTIN) 600 MG tablet TAKE 1 TABLET BY MOUTH 3  TIMES A DAY 180 tablet 0  . HYDROcodone-acetaminophen (NORCO) 5-325 MG tablet Take 1 tablet by mouth every 6 (six) hours as needed for moderate pain. 20 tablet 0  . ondansetron (ZOFRAN ODT) 4 MG disintegrating tablet Take 1 tablet (4 mg total) by mouth every 8 (eight) hours as needed for nausea or vomiting. 20 tablet 0  . pantoprazole (PROTONIX) 40 MG tablet Take 1 tablet (40 mg total) by mouth daily. 90 tablet 0  . polyethylene glycol (MIRALAX) packet Take 17 g by mouth daily. 14 each 0  . predniSONE (DELTASONE) 10 MG tablet 6 TAB PO DAY 1 5  TAB PO DAY 2 4 TAB PO DAY 3 3 TAB PO DAY 4 2 TAB PO DAY 5 1 TAB PO DAY 6 21 tablet 0  . QUEtiapine (SEROQUEL) 300 MG tablet Take 1 tablet (300  mg total) by mouth at bedtime. 90 tablet 1  . ranitidine (ZANTAC) 150 MG tablet Take 1 tablet (150 mg total) by mouth 2 (two) times daily. 180 tablet 2  . tiZANidine (ZANAFLEX) 4 MG tablet Take 1 tablet (4 mg total) by mouth every 6 (six) hours as needed. 60 tablet 1  . triamcinolone cream (KENALOG) 0.1 % APPLY 1 APPLICATION TOPICALLY 2 (TWO) TIMES DAILY. 45 g 0  . amLODipine (NORVASC) 2.5 MG tablet Take 1 tablet (2.5 mg total) by mouth 2 (two) times daily. 60 tablet 3  . levETIRAcetam (KEPPRA) 500 MG tablet TAKE 1 TABLET (500 MG TOTAL) BY MOUTH TWO (2) TIMES A DAY. 180 tablet 0  . metoprolol tartrate (LOPRESSOR) 50 MG tablet Take 1 tablet (50 mg total) by mouth 2 (two) times daily. 120 tablet 4  . Metoprolol Tartrate 75 MG TABS Take 75 mg by mouth 2 (two) times daily. 180 tablet 3   No facility-administered medications prior to visit.     No Known Allergies  Review of Systems  Constitutional: Positive for malaise/fatigue. Negative for fever.  HENT: Negative for congestion.   Eyes: Negative for blurred vision.  Respiratory: Negative for shortness of breath.   Cardiovascular: Negative for chest pain, palpitations and leg swelling.  Gastrointestinal: Negative for abdominal pain, blood in stool and nausea.  Genitourinary: Negative for dysuria and frequency.  Musculoskeletal: Positive for joint pain. Negative for falls.  Skin: Negative for rash.  Neurological: Positive for seizures. Negative for dizziness, loss of consciousness and headaches.  Endo/Heme/Allergies: Negative for environmental allergies.  Psychiatric/Behavioral: Negative for depression. The patient is not nervous/anxious.        Objective:    Physical Exam unable to obtain via phone  BP (!) 194/80 (BP Location: Left Arm, Patient Position: Sitting, Cuff Size: Normal)   Wt 100  lb (45.4 kg)   LMP  (LMP Unknown)   BMI 18.29 kg/m  Wt Readings from Last 3 Encounters:  01/16/19 100 lb (45.4 kg)  12/13/18 115 lb (52.2 kg)  12/04/17 123 lb (55.8 kg)    Diabetic Foot Exam - Simple   No data filed     Lab Results  Component Value Date   WBC 10.4 05/17/2017   HGB 13.3 05/17/2017   HCT 40.3 05/17/2017   PLT 339 05/17/2017   GLUCOSE 106 (H) 05/17/2017   CHOL 184 03/21/2017   TRIG 119.0 03/21/2017   HDL 64.50 03/21/2017   LDLCALC 96 03/21/2017   ALT 24 05/17/2017   AST 25 05/17/2017   NA 140 05/17/2017   K 3.8 05/17/2017   CL 104 05/17/2017   CREATININE 0.67 05/17/2017   BUN 8 05/17/2017   CO2 24 05/17/2017   TSH 0.78 03/21/2017   INR 1.0 11/19/2013   HGBA1C 6.7 (H) 03/21/2017    Lab Results  Component Value Date   TSH 0.78 03/21/2017   Lab Results  Component Value Date   WBC 10.4 05/17/2017   HGB 13.3 05/17/2017   HCT 40.3 05/17/2017   MCV 84.7 05/17/2017   PLT 339 05/17/2017   Lab Results  Component Value Date   NA 140 05/17/2017   K 3.8 05/17/2017   CO2 24 05/17/2017   GLUCOSE 106 (H) 05/17/2017   BUN 8 05/17/2017   CREATININE 0.67 05/17/2017   BILITOT 0.9 05/17/2017   ALKPHOS 70 05/17/2017   AST 25 05/17/2017   ALT 24 05/17/2017   PROT 7.6 05/17/2017   ALBUMIN 4.0 05/17/2017   CALCIUM 9.4 05/17/2017  ANIONGAP 12 05/17/2017   GFR 129.27 05/07/2017   Lab Results  Component Value Date   CHOL 184 03/21/2017   Lab Results  Component Value Date   HDL 64.50 03/21/2017   Lab Results  Component Value Date   LDLCALC 96 03/21/2017   Lab Results  Component Value Date   TRIG 119.0 03/21/2017   Lab Results  Component Value Date   CHOLHDL 3 03/21/2017   Lab Results  Component Value Date   HGBA1C 6.7 (H) 03/21/2017       Assessment & Plan:   Problem List Items Addressed This Visit    Essential hypertension - Primary (Chronic)    Has been out of meds for a couple of days and is in pain. She notes at ortho the other  day her BP was 194/80. Her refills are sent, she will report if BP remains high.      Relevant Medications   Metoprolol Tartrate 75 MG TABS   amLODipine (NORVASC) 2.5 MG tablet   Other Relevant Orders   Ambulatory referral to Neurology   CBC   Comprehensive metabolic panel   TSH   Seizure disorder ([place]) (Chronic)    She reports some recent seizure activity over past week. Is not currently seeing a neurologist so she is referred back for ongoing care.       Relevant Medications   levETIRAcetam (KEPPRA) 500 MG tablet   Other Relevant Orders   Ambulatory referral to Neurology   Migraine   Relevant Medications   Metoprolol Tartrate 75 MG TABS   amLODipine (NORVASC) 2.5 MG tablet   levETIRAcetam (KEPPRA) 500 MG tablet   Other Relevant Orders   Ambulatory referral to Neurology   Musculoskeletal pain    Is struggling with pain from a broken arm and is following with Dr Fredna Dow       Hyperglycemia    hgba1c acceptable, minimize simple carbs. Increase exercise as tolerated.       Relevant Orders   Hemoglobin A1c   Hyperlipidemia, mixed    Encouraged heart healthy diet, increase exercise, avoid trans fats, consider a krill oil cap daily. Tolerating Atorvastatin.       Relevant Medications   Metoprolol Tartrate 75 MG TABS   amLODipine (NORVASC) 2.5 MG tablet   Other Relevant Orders   Lipid panel   Urinary frequency   Relevant Orders   Urine Culture   Urinalysis      I am having Oletta Cohn. Ravi maintain her ondansetron, baclofen, famciclovir, ranitidine, triamcinolone cream, polyethylene glycol, gabapentin, Azelastine HCl, busPIRone, citalopram, QUEtiapine, cyclobenzaprine, predniSONE, atorvastatin, diclofenac, tiZANidine, HYDROcodone-acetaminophen, pantoprazole, Metoprolol Tartrate, amLODipine, and levETIRAcetam.  Meds ordered this encounter  Medications  . Metoprolol Tartrate 75 MG TABS    Sig: Take 75 mg by mouth 2 (two) times daily.    Dispense:  180 tablet    Refill:   1  . amLODipine (NORVASC) 2.5 MG tablet    Sig: Take 1 tablet (2.5 mg total) by mouth 2 (two) times daily.    Dispense:  180 tablet    Refill:  1  . levETIRAcetam (KEPPRA) 500 MG tablet    Sig: TAKE 1 TABLET (500 MG TOTAL) BY MOUTH TWO (2) TIMES A DAY.    Dispense:  180 tablet    Refill:  1     I discussed the assessment and treatment plan with the patient. The patient was provided an opportunity to ask questions and all were answered. The patient agreed with the plan  and demonstrated an understanding of the instructions.   The patient was advised to call back or seek an in-person evaluation if the symptoms worsen or if the condition fails to improve as anticipated.  I provided 25 minutes of non-face-to-face time during this encounter.   [name], MD

## 2019-01-18 NOTE — Assessment & Plan Note (Signed)
She reports some recent seizure activity over past week. Is not currently seeing a neurologist so she is referred back for ongoing care.

## 2019-01-18 NOTE — Assessment & Plan Note (Addendum)
Has been out of meds for a couple of days and is in pain. She notes at ortho the other day her BP was 194/80. Her refills are sent, she will report if BP remains high.

## 2019-01-18 NOTE — Assessment & Plan Note (Signed)
hgba1c acceptable, minimize simple carbs. Increase exercise as tolerated.  

## 2019-01-22 ENCOUNTER — Telehealth: Payer: Self-pay | Admitting: Family Medicine

## 2019-01-22 NOTE — Telephone Encounter (Signed)
LM to schedule lab appt w/in 1 wk & to schedule f/u with Dr. Charlett Blake (virtual) in 3-4 weeks.

## 2019-01-29 ENCOUNTER — Other Ambulatory Visit: Payer: Self-pay | Admitting: Family Medicine

## 2019-01-29 MED ORDER — BUTALBITAL-ACETAMINOPHEN 50-300 MG PO TABS: 1.0000 | ORAL_TABLET | Freq: Two times a day (BID) | ORAL | 1 refills | Status: DC | PRN

## 2019-01-29 NOTE — Telephone Encounter (Signed)
Please advise 

## 2019-01-29 NOTE — Telephone Encounter (Addendum)
Patient scheduled lab appointment for 02/05/2019.  Patient states left side head ache and pressure is not improving requesting Rx.    [id number] - [place], [place]

## 2019-01-29 NOTE — Telephone Encounter (Signed)
She needs an appt in next couple of weeks to follow up on her headaches and I have sent her in some bupap to try for her headaches. Please let her know

## 2019-02-05 ENCOUNTER — Other Ambulatory Visit: Payer: Self-pay

## 2019-02-05 ENCOUNTER — Other Ambulatory Visit (INDEPENDENT_AMBULATORY_CARE_PROVIDER_SITE_OTHER): Payer: BLUE CROSS/BLUE SHIELD

## 2019-02-05 DIAGNOSIS — R739 Hyperglycemia, unspecified: Secondary | ICD-10-CM

## 2019-02-05 DIAGNOSIS — E782 Mixed hyperlipidemia: Secondary | ICD-10-CM

## 2019-02-05 DIAGNOSIS — R350 Frequency of micturition: Secondary | ICD-10-CM

## 2019-02-05 DIAGNOSIS — I10 Essential (primary) hypertension: Secondary | ICD-10-CM | POA: Diagnosis not present

## 2019-02-05 LAB — URINALYSIS, ROUTINE W REFLEX MICROSCOPIC
Bilirubin Urine: NEGATIVE
Ketones, ur: NEGATIVE
Leukocytes,Ua: NEGATIVE
Nitrite: NEGATIVE
Specific Gravity, Urine: 1.02 (ref 1.000–1.030)
Total Protein, Urine: NEGATIVE
Urine Glucose: NEGATIVE
Urobilinogen, UA: 0.2 (ref 0.0–1.0)
pH: 6 (ref 5.0–8.0)

## 2019-02-05 LAB — COMPREHENSIVE METABOLIC PANEL WITH GFR
ALT: 18 U/L (ref 0–35)
AST: 15 U/L (ref 0–37)
Albumin: 4.7 g/dL (ref 3.5–5.2)
Alkaline Phosphatase: 63 U/L (ref 39–117)
BUN: 12 mg/dL (ref 6–23)
CO2: 30 meq/L (ref 19–32)
Calcium: 10 mg/dL (ref 8.4–10.5)
Chloride: 102 meq/L (ref 96–112)
Creatinine, Ser: 0.52 mg/dL (ref 0.40–1.20)
GFR: 145.36 mL/min
Glucose, Bld: 92 mg/dL (ref 70–99)
Potassium: 4.4 meq/L (ref 3.5–5.1)
Sodium: 140 meq/L (ref 135–145)
Total Bilirubin: 0.5 mg/dL (ref 0.2–1.2)
Total Protein: 7.4 g/dL (ref 6.0–8.3)

## 2019-02-05 LAB — CBC
HCT: 40.7 % (ref 36.0–46.0)
Hemoglobin: 13.3 g/dL (ref 12.0–15.0)
MCHC: 32.6 g/dL (ref 30.0–36.0)
MCV: 84.8 fl (ref 78.0–100.0)
Platelets: 294 10*3/uL (ref 150.0–400.0)
RBC: 4.8 Mil/uL (ref 3.87–5.11)
RDW: 13.6 % (ref 11.5–15.5)
WBC: 9.5 10*3/uL (ref 4.0–10.5)

## 2019-02-05 LAB — LIPID PANEL
Cholesterol: 208 mg/dL — ABNORMAL HIGH (ref 0–200)
HDL: 51.8 mg/dL
LDL Cholesterol: 126 mg/dL — ABNORMAL HIGH (ref 0–99)
NonHDL: 156.31
Total CHOL/HDL Ratio: 4
Triglycerides: 152 mg/dL — ABNORMAL HIGH (ref 0.0–149.0)
VLDL: 30.4 mg/dL (ref 0.0–40.0)

## 2019-02-05 LAB — TSH: TSH: 0.77 u[IU]/mL (ref 0.35–4.50)

## 2019-02-05 LAB — HEMOGLOBIN A1C: Hgb A1c MFr Bld: 6.5 % (ref 4.6–6.5)

## 2019-02-06 LAB — URINE CULTURE
MICRO NUMBER:: 1119178
Result:: NO GROWTH
SPECIMEN QUALITY:: ADEQUATE

## 2019-02-09 ENCOUNTER — Ambulatory Visit: Payer: BLUE CROSS/BLUE SHIELD | Admitting: Family Medicine

## 2019-02-09 ENCOUNTER — Other Ambulatory Visit: Payer: Self-pay

## 2019-02-09 DIAGNOSIS — R739 Hyperglycemia, unspecified: Secondary | ICD-10-CM

## 2019-02-11 ENCOUNTER — Telehealth: Payer: Self-pay

## 2019-02-11 NOTE — Telephone Encounter (Signed)
Spoke with patient's husband. Pt was not in. Advised to have patient to call back

## 2019-02-11 NOTE — Telephone Encounter (Signed)
Her labs look good, cholesterol up only slightly. Encourage heart healthy diet such as DASH or MIND diet, increase exercise, avoid trans fats, processed foods, and simple carbohydrates, consider a krill oil cap daily

## 2019-02-13 NOTE — Telephone Encounter (Signed)
Pt returned Heather's call / please call Pt back on Monday

## 2019-02-15 NOTE — Progress Notes (Signed)
Unable to perform visit, patient did not answer.

## 2019-02-16 NOTE — Telephone Encounter (Signed)
Patient advised of results and provider's advise 

## 2019-02-26 ENCOUNTER — Ambulatory Visit: Payer: BLUE CROSS/BLUE SHIELD | Admitting: Family Medicine

## 2019-03-23 ENCOUNTER — Ambulatory Visit (INDEPENDENT_AMBULATORY_CARE_PROVIDER_SITE_OTHER): Payer: BC Managed Care – PPO | Admitting: Neurology

## 2019-03-23 ENCOUNTER — Encounter: Payer: Self-pay | Admitting: Neurology

## 2019-03-23 ENCOUNTER — Other Ambulatory Visit: Payer: Self-pay

## 2019-03-23 ENCOUNTER — Ambulatory Visit: Payer: BLUE CROSS/BLUE SHIELD | Admitting: Family Medicine

## 2019-03-23 VITALS — BP 162/103 | HR 85 | Ht 62.0 in | Wt 113.0 lb

## 2019-03-23 DIAGNOSIS — R569 Unspecified convulsions: Secondary | ICD-10-CM

## 2019-03-23 DIAGNOSIS — R519 Headache, unspecified: Secondary | ICD-10-CM | POA: Diagnosis not present

## 2019-03-23 MED ORDER — LEVETIRACETAM 500 MG PO TABS: ORAL_TABLET | ORAL | 3 refills | Status: DC

## 2019-03-23 NOTE — Progress Notes (Signed)
NEUROLOGY CONSULTATION NOTE  Ruth Bell MRN: [id number] DOB: Aug 17, 1960  Referring provider: Dr. Penni Bell Primary care provider: Dr. Penni Bell  Reason for consult:  Seizures, migraines  Dear Dr Ruth Bell:  Thank you for your kind referral of Ruth Bell for consultation of the above symptoms. Although her history is well known to you, please allow me to reiterate it for the purpose of our medical record. Her husband is present during the visit to provide additional information. Records and images were personally reviewed where available.   HISTORY OF PRESENT ILLNESS: This is a 59 year old right-handed woman with a history of hypertension, hyperlipidemia, anxiety, depression, migraines, psychogenic non-epileptic events, presenting for evaluation of seizures and headaches. She denies any prior history of migraines however records from her prior neurologist Dr. Sabra Bell indicate a history of headaches. She states headaches started 6 months ago with pain in the vertex and lateralizing to the left side and left eye. If she pulls her hair in the middle, it takes pressure/tension off. Headaches occur once a week, with nausea/vomiting, sensitivity to lights and sounds. Ibuprofen has not helped. She reports seizures started at age 89, she used to have them daily, sometimes 3-4 times a day. She reports her heart stopped due to a seizure at one point. Records from Dr. Sabra Bell indicate that she had an EEG in 2011 with provocation-induced body and head jerking without EEG correlate. In 2012, episodes of staring and slumping down followed by generalized shaking and gagging were reported and felt to be psychogenic in the ER. In 2014, she was taking Dilantin and had an EEG capturing shaking with no EEG change. She was switched to Ruth Bell in 2016. There is an EEG in 2017 with note of mild slowing in the left temporal and left frontal region. "Seizure cream" was used and she started feeling scared then had  generalized shaking that at times could be stopped with the use of the "anti-seizure cream." At that time she was reporting right arm then head jerking. She had been event-free for 4 years and was off Ruth Bell, until she had a fall in September 2020 and fractured her left wrist. She states seizures started after this. Her husband describes them as "pain-induced seizures." He states the seizures look like her prior seizures where her whole body starts jerking then she passes out. No nocturnal seizures. She states the seizures are different now, not as hard as prior seizures, starting as a nagging headache in the middle of her forehead, she can see her hand jumping and knows it is about to happen, then loses consciousness. She would be unaware of her surroundings. No tongue bite or incontinence. She has fallen to the ground. She reports Keppra 500mg  BID was restarted 2 weeks ago and she has not had any seizures in 3 weeks. No side effects. Due to the pain, she has not been sleeping well, awake at 3 or 4AM due to left arm pain. In the past she took Quetiapine for sleep prescribed by Psychiatry, but has been off this for a while now. She has had some dizziness the past 3 days, yesterday she felt like she was falling to one side. She lives with her husband and daughter and states there is no stress in their house. She does not drive. She has had left arm numbness and tingling since the fall, with pain in the middle of her back. She does not drive. There is no prior history of physical or emotional  trauma. Her sister has a history of seizures. She had a normal birth and early development, no history of febrile seizures, CNS infections, significant head injuries, neurosurgical procedures.   Diagnostic Data: MRI brain without contrast in July 2017 was normal. There have been several EEGs as noted above with shaking episodes captured with no EEG correlate, consistent with psychogenic events.  PAST MEDICAL HISTORY: Past  Medical History:  Diagnosis Date  . Anemia   . Anxiety   . Arm skin lesion, left 09/05/2014  . Constipation 12/27/2013  . Depression   . Dysphagia 04/15/2016  . Eczema 08/22/2015  . GERD (gastroesophageal reflux disease)   . Hematuria 03/04/2016  . History of shingles 01/22/2016  . Hyperglycemia 01/15/2015  . Hyperlipidemia, mixed 08/22/2015  . Hypertension   . Menometrorrhagia 2006  . Migraine, unspecified, without mention of intractable migraine without mention of status migrainosus   . Preventative health care 11/04/2016  . Pseudoseizures   . Seizures ([place])    history of pseudoseizure disorder, last last seizure 3 wks, keppra inc in dosage  . Tubular adenoma of Bell 05/2011  . Uterine leiomyoma 2006    PAST SURGICAL HISTORY: Past Surgical History:  Procedure Laterality Date  . ABDOMINAL HYSTERECTOMY  04/06/04  . APPENDECTOMY    . BILATERAL SALPINGOOPHORECTOMY  04/06/04  . MASS EXCISION Left 06/02/2015   Procedure: EXCISION LEFT ARM MASS;  Surgeon: Ruth Messing III, MD;  Location: [facility];  Service: General;  Laterality: Left;  . ROTATOR CUFF REPAIR      MEDICATIONS: Current Outpatient Medications on File Prior to Visit  Medication Sig Dispense Refill  . amLODipine (NORVASC) 2.5 MG tablet Take 1 tablet (2.5 mg total) by mouth 2 (two) times daily. 180 tablet 1  . atorvastatin (LIPITOR) 10 MG tablet TAKE 1 TABLET BY MOUTH  DAILY 90 tablet 1  . Azelastine HCl 0.15 % SOLN Place 2 sprays into both nostrils 2 (two) times daily. 30 mL 2  . baclofen (LIORESAL) 10 MG tablet TAKE 1/2 TABLET BY MOUTH 3 TIMES A DAY  4  . busPIRone (BUSPAR) 7.5 MG tablet Take 1 tablet (7.5 mg total) by mouth 3 (three) times daily. 90 tablet 1  . Butalbital-Acetaminophen 50-300 MG TABS Take 1 tablet by mouth 2 (two) times daily as needed. 60 tablet 1  . citalopram (CELEXA) 20 MG tablet Take 1 tablet (20 mg total) by mouth daily. 90 tablet 1  . cyclobenzaprine (FLEXERIL) 10 MG tablet Take 1  tablet (10 mg total) by mouth at bedtime. Please dc the 90 day rx with one refill. Only give 30 tabs. 30 tablet 0  . diclofenac (VOLTAREN) 75 MG EC tablet Take 1 tablet (75 mg total) by mouth 2 (two) times daily. 60 tablet 1  . famciclovir (FAMVIR) 500 MG tablet Take 1 tablet (500 mg total) by mouth 3 (three) times daily. 21 tablet 0  . gabapentin (NEURONTIN) 600 MG tablet TAKE 1 TABLET BY MOUTH 3  TIMES A DAY 180 tablet 0  . HYDROcodone-acetaminophen (NORCO) 5-325 MG tablet Take 1 tablet by mouth every 6 (six) hours as needed for moderate pain. 20 tablet 0  . levETIRAcetam (KEPPRA) 500 MG tablet TAKE 1 TABLET (500 MG TOTAL) BY MOUTH TWO (2) TIMES A DAY. 180 tablet 1  . Metoprolol Tartrate 75 MG TABS Take 75 mg by mouth 2 (two) times daily. 180 tablet 1  . ondansetron (ZOFRAN ODT) 4 MG disintegrating tablet Take 1 tablet (4 mg total) by mouth every  8 (eight) hours as needed for nausea or vomiting. 20 tablet 0  . pantoprazole (PROTONIX) 40 MG tablet Take 1 tablet (40 mg total) by mouth daily. 90 tablet 0  . polyethylene glycol (MIRALAX) packet Take 17 g by mouth daily. 14 each 0  . predniSONE (DELTASONE) 10 MG tablet 6 TAB PO DAY 1 5 TAB PO DAY 2 4 TAB PO DAY 3 3 TAB PO DAY 4 2 TAB PO DAY 5 1 TAB PO DAY 6 21 tablet 0  . QUEtiapine (SEROQUEL) 300 MG tablet Take 1 tablet (300 mg total) by mouth at bedtime. 90 tablet 1  . ranitidine (ZANTAC) 150 MG tablet Take 1 tablet (150 mg total) by mouth 2 (two) times daily. 180 tablet 2  . tiZANidine (ZANAFLEX) 4 MG tablet Take 1 tablet (4 mg total) by mouth every 6 (six) hours as needed. 60 tablet 1  . triamcinolone cream (KENALOG) 0.1 % APPLY 1 APPLICATION TOPICALLY 2 (TWO) TIMES DAILY. 45 g 0   No current facility-administered medications on file prior to visit.    ALLERGIES: No Known Allergies  FAMILY HISTORY: Family History  Problem Relation Age of Onset  . Heart disease Mother   . Heart attack Mother   . Cancer Father        Bell  .  Hypertension Father   . Cancer Sister        brain  . Cancer Brother   . Hypertension Sister   . Cancer Sister        unsure of type  . Diabetes Sister        type 2  . Heart attack Sister   . Hypertension Brother   . Bell cancer Neg Hx     SOCIAL HISTORY: Social History   Socioeconomic History  . Marital status: Married    Spouse name: Gwyndolyn Saxon  . Number of children: 2  . Years of education: Not on file  . Highest education level: Not on file  Occupational History  . Occupation: CMA-retired  Tobacco Use  . Smoking status: Light Tobacco Smoker    Packs/day: 0.50    Years: 40.00    Pack years: 20.00    Types: Cigarettes    Start date: 12/18/2015  . Smokeless tobacco: Never Used  . Tobacco comment: 3-4 cigarettes daily  Substance and Sexual Activity  . Alcohol use: Yes  . Drug use: No  . Sexual activity: Not on file    Comment: lives with husband, no dietary restrictions.   Other Topics Concern  . Not on file  Social History Narrative   Lives with her husband and their son.   Daughter lives in [place], [place].   Social Determinants of Health   Financial Resource Strain:   . Difficulty of Paying Living Expenses: Not on file  Food Insecurity:   . Worried About Charity fundraiser in the Last Year: Not on file  . Ran Out of Food in the Last Year: Not on file  Transportation Needs:   . Lack of Transportation (Medical): Not on file  . Lack of Transportation (Non-Medical): Not on file  Physical Activity:   . Days of Exercise per Week: Not on file  . Minutes of Exercise per Session: Not on file  Stress:   . Feeling of Stress : Not on file  Social Connections:   . Frequency of Communication with Friends and Family: Not on file  . Frequency of Social Gatherings with Friends and Family: Not on file  .  Attends Religious Services: Not on file  . Active Member of Clubs or Organizations: Not on file  . Attends Archivist Meetings: Not on file  . Marital Status:  Not on file  Intimate Partner Violence:   . Fear of Current or Ex-Partner: Not on file  . Emotionally Abused: Not on file  . Physically Abused: Not on file  . Sexually Abused: Not on file    REVIEW OF SYSTEMS: Constitutional: No fevers, chills, or sweats, no generalized fatigue, change in appetite Eyes: No visual changes, double vision, eye pain Ear, nose and throat: No hearing loss, ear pain, nasal congestion, sore throat Cardiovascular: No chest pain, palpitations Respiratory:  No shortness of breath at rest or with exertion, wheezes GastrointestinaI: No nausea, vomiting, diarrhea, abdominal pain, fecal incontinence Genitourinary:  No dysuria, urinary retention or frequency Musculoskeletal:  + neck pain, back pain Integumentary: No rash, pruritus, skin lesions Neurological: as above Psychiatric: No depression, insomnia, anxiety Endocrine: No palpitations, fatigue, diaphoresis, mood swings, change in appetite, change in weight, increased thirst Hematologic/Lymphatic:  No anemia, purpura, petechiae. Allergic/Immunologic: no itchy/runny eyes, nasal congestion, recent allergic reactions, rashes  PHYSICAL EXAM: Vitals:   03/23/19 0856  BP: (!) 162/103  Pulse: 85  SpO2: 100%   General: No acute distress Head:  Normocephalic/atraumatic Skin/Extremities: No rash, swelling at left wrist Neurological Exam: Mental status: alert and oriented to person, place, and time, no dysarthria or aphasia, Fund of knowledge is appropriate.  Recent and remote memory are intact.  Attention and concentration are normal.    Able to name objects and repeat phrases. Cranial nerves: CN I: not tested CN II: pupils equal, round and reactive to light, visual fields intact CN Bell, IV, VI:  full range of motion, no nystagmus, no ptosis CN V: facial sensation intact CN VII: upper and lower face symmetric CN VIII: hearing intact to conversation CN IX, X: gag intact, uvula midline CN XI: sternocleidomastoid  and trapezius muscles intact CN XII: tongue midline Bulk & Tone: normal, no fasciculations. Motor: 5/5 throughout with no pronator drift, pain on left wrist with movement Sensation: intact to light touch, cold, pin, vibration and joint position sense.  No extinction to double simultaneous stimulation.  Romberg test negative Deep Tendon Reflexes: +2 throughout, no ankle clonus Plantar responses: downgoing bilaterally Cerebellar: no incoordination on finger to nose testing Gait: narrow-based and steady, able to tandem walk adequately. Tremor: none  IMPRESSION: This is a 59 year old right-handed woman with a history of hypertension, hyperlipidemia, anxiety, depression, migraines, psychogenic non-epileptic events, presenting for evaluation of seizures and headaches. She has a history of psychogenic non-epileptic events captured on EEG from 2011-2017, she has been off seizure medication and had been event-free for 4 years, until seizures recurred after her left wrist fracture. Her husband describes them as "pain-induced seizures." She had been restarted on Keppra 500mg  BID 2 weeks ago and denies any seizures in 3 weeks. I discussed the diagnosis of psychogenic non-epileptic events, stressor at this time appears to be pain due to recent fracture. Discussed that she will likely have no long-term need for seizure medication as pain resolves. She is reporting new headaches localized over the left side. CT head without contrast and a 1-hour EEG will be ordered. [place] driving laws were discussed with the patient, and she knows to stop driving after a seizure, until 6 months seizure-free. She does not drive. Follow-up in 6 months, she knows to call for any changes.   Thank you for  allowing me to participate in the care of this patient. Please do not hesitate to call for any questions or concerns.   Ellouise Newer, M.D.  CC: Dr. Charlett Bell

## 2019-03-23 NOTE — Patient Instructions (Signed)
1. Schedule head CT without contrast 2. Schedule 1-hour EEG 3. Continue Keppra (Levetiracetam) 500mg  twice a day for now. We will likely not need it long-term, as you heal from the pain on left arm 4. Follow-up in 6 months, call for any changes  Seizure Precautions: 1. If medication has been prescribed for you to prevent seizures, take it exactly as directed.  Do not stop taking the medicine without talking to your doctor first, even if you have not had a seizure in a long time.   2. Avoid activities in which a seizure would cause danger to yourself or to others.  Don't operate dangerous machinery, swim alone, or climb in high or dangerous places, such as on ladders, roofs, or girders.  Do not drive unless your doctor says you may.  3. If you have any warning that you may have a seizure, lay down in a safe place where you can't hurt yourself.    4.  No driving for 6 months from last seizure, as per [facility].   Please refer to the following link on the [facility] website for more information: [email or web address]   5.  Maintain good sleep hygiene. Avoid alcohol.  6.  Contact your doctor if you have any problems that may be related to the medicine you are taking.  7.  Call 911 and bring the patient back to the ED if:        A.  The seizure lasts longer than 5 minutes.       B.  The patient doesn't awaken shortly after the seizure  C.  The patient has new problems such as difficulty seeing, speaking or moving  D.  The patient was injured during the seizure  E.  The patient has a temperature over 102 F (39C)  F.  The patient vomited and now is having trouble breathing

## 2019-03-26 ENCOUNTER — Telehealth: Payer: Self-pay | Admitting: *Deleted

## 2019-03-26 NOTE — Telephone Encounter (Signed)
LMOM to call us back and that there is a 9am slot open on Monday 1/11 and 11:30 on Tuesday 1/12

## 2019-03-27 ENCOUNTER — Other Ambulatory Visit: Payer: Self-pay

## 2019-03-27 ENCOUNTER — Encounter: Payer: Self-pay | Admitting: Cardiology

## 2019-03-27 ENCOUNTER — Ambulatory Visit (INDEPENDENT_AMBULATORY_CARE_PROVIDER_SITE_OTHER): Payer: BLUE CROSS/BLUE SHIELD | Admitting: Cardiology

## 2019-03-27 VITALS — BP 117/60 | HR 58 | Temp 97.5°F | Ht 62.0 in | Wt 112.0 lb

## 2019-03-27 DIAGNOSIS — R0789 Other chest pain: Secondary | ICD-10-CM

## 2019-03-27 DIAGNOSIS — E782 Mixed hyperlipidemia: Secondary | ICD-10-CM | POA: Diagnosis not present

## 2019-03-27 DIAGNOSIS — R002 Palpitations: Secondary | ICD-10-CM | POA: Diagnosis not present

## 2019-03-27 DIAGNOSIS — I10 Essential (primary) hypertension: Secondary | ICD-10-CM

## 2019-03-27 NOTE — Progress Notes (Signed)
Primary Care Provider: Mosie Lukes, MD Cardiologist: No primary care provider on file. Electrophysiologist:   Clinic Note: Chief Complaint  Patient presents with  . Chest Pain    Left-sided chest wall pain    HPI:    Ruth Bell is a 59 y.o. female who is being seen today for the evaluation of [place] at the request of Mosie Lukes, MD.  Ruth Bell was last seen on December 18, 2016 for follow-up of palpitations and syncope as well as some chest pain.  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  We evaluated her with an echocardiogram, Myoview and event monitor.  All of which were relatively normal and reassuring.  She was recently seen by Dr. Delice Lesch from neurology in follow-up for psychogenic nonepileptic "seizure" episodes.  There is found on EEG from 2011-2017. -->  Apparently she started having episodes again after her left wrist fracture.  They were described as "pain induced seizures ".  ->  She was started on Keppra prior to seeing neurology, but this was stopped.  Recent Hospitalizations:   ER on December 13, 2018 -> she fell and broke her wrist (close Colles' fracture of left radius)  Had outpatient surgical repair on October 6 ([facility], -Dr. Leanora Cover)  Reviewed  CV studies:    The following studies were reviewed today: (if available, images/films reviewed: From Epic Chart or Care Everywhere) . Myoview April 20, 2016: Reached heart rate of 127 bpm with Lexiscan.  EF 60 to 65%.  LOW RISK.  No ischemia or infarction. . Echocardiogram 02/23/2016: EF 55-6%.  No R WMA.  GR 1 DD.  Normal valves. . Event monitor 03/06/2016:   The initial Baseline transmission shows SINUS BRADYCARDIA, SINUS RHYTHM  Rare PVCs and PACs noted.  No arrhythmias noted.  The high average heart rate seen for the monitored period was 80 BPM.  The low average heart rate seen for the  monitored period was 60 BPM.  Symptoms of lightheadedness and dizziness, fluttering in chest and chest pain noted with sinus rhythm. Also symptom of "pass out" occurred with sinus rhythm.   Interval History:   Ruth Bell is here today because she is having pretty significant left-sided chest discomfort.  Mostly the pain is related to her left arm.  Despite the fact that her surgery was over 2 months ago, she has significant pain in that arm radiating all the way to the shoulder.  She has pain in her shoulder that she says is still related to the fall although she broke her arm she had pain in her shoulder as well.  She says that the pain in her arm is worse with moving her shoulder joint her arm in any direction.  Worse with deep inspiration.  It is also worse with leaning forward.  Her chest pain is not a more central deep type chest discomfort.  It is reproducible with movements and palpation.  The location is more in the left upper chest radiating to the shoulder.  It is not made worse with exertion with exception of if she swings her arm.  She describes it as an aching/pressure type pain.  Otherwise, she has had off-and-on dizzy spells and orthostatic spells.  She talked about her "seizure" episodes that came back, but has not had any syncope or near syncope.  CV Review of Symptoms (Summary) Cardiovascular ROS: positive for - palpitations and Chest wall  pain and dizziness negative for - edema, loss of consciousness, orthopnea, paroxysmal nocturnal dyspnea, rapid heart rate, shortness of breath or Syncope/near syncope, TIA/amaurosis fugax, claudication  The patient does not have symptoms concerning for COVID-19 infection (fever, chills, cough, or new shortness of breath).  The patient is practicing social distancing and masking.     REVIEWED OF SYSTEMS   A comprehensive ROS was performed. Review of Systems  Constitutional: Positive for malaise/fatigue. Negative for weight loss.    HENT: Negative for congestion and nosebleeds.   Respiratory: Negative for shortness of breath.   Gastrointestinal: Negative for abdominal pain, blood in stool and melena.  Genitourinary: Negative for hematuria.  Musculoskeletal: Positive for falls (Fall in September.  None since.), joint pain (Shoulder, elbow and wrist) and neck pain.  Neurological: Positive for dizziness (Positional) and focal weakness (Left arm). Negative for seizures (See above) and headaches.  Psychiatric/Behavioral: Positive for memory loss. The patient is nervous/anxious. The patient does not have insomnia.    I have reviewed and (if needed) personally updated the patient's problem list, medications, allergies, past medical and surgical history, social and family history.   PAST MEDICAL HISTORY   Past Medical History:  Diagnosis Date  . Anemia   . Anxiety   . Arm skin lesion, left 09/05/2014  . Constipation 12/27/2013  . Depression   . Dysphagia 04/15/2016  . Eczema 08/22/2015  . GERD (gastroesophageal reflux disease)   . Hematuria 03/04/2016  . History of shingles 01/22/2016  . Hyperglycemia 01/15/2015  . Hyperlipidemia, mixed 08/22/2015  . Hypertension   . Menometrorrhagia 2006  . Migraine, unspecified, without mention of intractable migraine without mention of status migrainosus   . Preventative health care 11/04/2016  . Pseudoseizures   . Seizures ([place])    history of pseudoseizure disorder, last last seizure 3 wks, keppra inc in dosage  . Tubular adenoma of colon 05/2011  . Uterine leiomyoma 2006     PAST SURGICAL HISTORY   Past Surgical History:  Procedure Laterality Date  . ABDOMINAL HYSTERECTOMY  04/06/04  . APPENDECTOMY    . BILATERAL SALPINGOOPHORECTOMY  04/06/04  . CARDIAC EVENT MONITOR  02/2016   Mostly sinus bradycardia and sinus rhythm.  Rare PVCs and PACs.  No arrhythmias.  Symptoms of chest pain and fluttering as well as "passed out spell" noted with normal sinus rhythm.  Marland Kitchen MASS EXCISION  Left 06/02/2015   Procedure: EXCISION LEFT ARM MASS;  Surgeon: Autumn Messing III, MD;  Location: [facility];  Service: General;  Laterality: Left;  . NM MYOVIEW LTD  04/2016   Reached heart rate of 127 bpm with Lexiscan.  EF 60 to 65%.  LOW RISK.  No ischemia or infarction.  Marland Kitchen ROTATOR CUFF REPAIR    . TRANSTHORACIC ECHOCARDIOGRAM  01/2016   F 55-6%.  No R WMA.  GR 1 DD.  Normal valves.     MEDICATIONS/ALLERGIES   No outpatient medications have been marked as taking for the 03/27/19 encounter (Office Visit) with [name], MD.    No Known Allergies   SOCIAL HISTORY/FAMILY HISTORY   Social History   Tobacco Use  . Smoking status: Light Tobacco Smoker    Packs/day: 0.50    Years: 40.00    Pack years: 20.00    Types: Cigarettes    Start date: 12/18/2015  . Smokeless tobacco: Never Used  . Tobacco comment: 3-4 cigarettes daily  Substance Use Topics  . Alcohol use: Yes  . Drug use: No  Social History   Social History Narrative   Lives with her husband and their son.   Daughter lives in [place], [place].    Family History family history includes Cancer in her brother, father, sister, and sister; Diabetes in her sister; Heart attack in her mother and sister; Heart disease in her mother; Hypertension in her brother, father, and sister.   OBJCTIVE -PE, EKG, labs   Wt Readings from Last 3 Encounters:  03/27/19 112 lb (50.8 kg)  03/23/19 113 lb (51.3 kg)  01/16/19 100 lb (45.4 kg)    Physical Exam: BP 117/60   Pulse (!) 58   Temp (!) 97.5 F (36.4 C)   Ht 5\' 2"  (1.575 m)   Wt 112 lb (50.8 kg)   LMP  (LMP Unknown)   BMI 20.49 kg/m  Physical Exam  Constitutional: She is oriented to person, place, and time. She appears well-developed and well-nourished. She appears distressed.  She appears to be in quite a lot of pain.  Still has her arm in a sling and is favoring her left arm/shoulder.  HENT:  Head: Normocephalic and atraumatic.  Neck: JVD present.   Cardiovascular: Normal rate, regular rhythm, normal heart sounds and normal pulses.  No extrasystoles are present. PMI is not displaced. Exam reveals no gallop and no friction rub.  No murmur heard. Pulmonary/Chest: Effort normal and breath sounds normal. No respiratory distress. She has no wheezes. She has no rales. She exhibits tenderness (Exquisite tenderness along the lateral upper chest radiating up to her shoulder.  This is well above her breast.  Totally reproducible pain which is similar to what she is complaining of.).  Abdominal: Soft. Bowel sounds are normal. She exhibits no distension. There is no abdominal tenderness. There is no rebound.  Musculoskeletal:        General: Deformity (Left arm has a brace on the wrist, and she is cradling her forearm and shoulder because of discomfort.) present. No edema.     Cervical back: Decreased range of motion: Mostly decreased movement to the left because of discomfort.  Neurological: She is alert and oriented to person, place, and time.  Psychiatric: She has a normal mood and affect. Her behavior is normal. Judgment and thought content normal.  Seems very anxious, notably in pain.  Vitals reviewed.   Adult ECG Report  Rate: 58 ;  Rhythm: sinus bradycardia and Normal axis, intervals and durations.;   Narrative Interpretation: Normal EKG  Recent Labs:    Lab Results  Component Value Date   CHOL 208 (H) 02/05/2019   HDL 51.80 02/05/2019   LDLCALC 126 (H) 02/05/2019   TRIG 152.0 (H) 02/05/2019   CHOLHDL 4 02/05/2019   Lab Results  Component Value Date   CREATININE 0.52 02/05/2019   BUN 12 02/05/2019   NA 140 02/05/2019   K 4.4 02/05/2019   CL 102 02/05/2019   CO2 30 02/05/2019    ASSESSMENT/PLAN    Problem List Items Addressed This Visit    Anterior chest wall pain (Chronic)    She had very similar symptoms back in 2017 that she has now.  Those symptoms are more central located where his current symptoms are more lateral.  She  had a negative Myoview in 2018.  Current symptoms are not anginal in nature as airway lateral and reproducible.      Essential hypertension (Chronic)    Despite being his significant pain, blood pressures controlled on amlodipine and metoprolol.  Defer to PCP.  Palpitations (Chronic)    Seem to be controlled with beta-blocker.      Musculoskeletal chest pain - Primary    Agree with her PCPs assessment that this chest pain is very much musculoskeletal in fact, probably muscular as it appears to be in the pectoralis minor distribution.  She says she has been having some rotator cuff type pains.  I would not recommend stress test evaluation for this pain that is quite clearly musculoskeletal and not anginal in nature.      Relevant Orders   EKG 12-Lead (Completed)   Hyperlipidemia, mixed    Is on low-dose atorvastatin, lipids are not quite at goal for someone her age and risk.  Would try to shoot for LDL closer to 100. Consider titration of statin, defer to PCP.        Patient was reassured that her chest discomfort is probably not anginal in nature.  Would not require stress testing or other evaluation.  As needed follow-up   COVID-19 Education: The signs and symptoms of COVID-19 were discussed with the patient and how to seek care for testing (follow up with PCP or arrange E-visit).   The importance of social distancing was discussed today.  I spent a total of 21 minutes with the patient and chart review. >  50% of the time was spent in direct patient consultation.  Additional time spent with chart review (studies, outside notes, etc): 10 Total Time: 31 min   Current medicines are reviewed at length with the patient today.  (+/- concerns) n/a   Patient Instructions / Medication Changes & Studies & Tests Ordered   Patient Instructions  Medication Instructions:  No changes  *If you need a refill on your cardiac medications before your next appointment, please call  your pharmacy*  Lab Work: Not needed  Testing/Procedures: Not needed  Follow-Up: At [facility], you and your health needs are our priority.  As part of our continuing mission to provide you with exceptional heart care, we have created designated Provider Care Teams.  These Care Teams include your primary Cardiologist (physician) and Advanced Practice Providers (APPs -  Physician Assistants and Nurse Practitioners) who all work together to provide you with the care you need, when you need it.  Your next appointment:   As needed   The format for your next appointment:   Either In Person or Virtual  Provider:   Glenetta Hew, MD   Other instructions Contact primary  Doctor for chest pain       Studies Ordered:   Orders Placed This Encounter  Procedures  . EKG 12-Lead     Glenetta Hew, M.D., M.S. Interventional Cardiologist   Pager # [phone] Phone # [phone] [place], [place] [address]   Thank you for choosing Heartcare at [facility]!!

## 2019-03-27 NOTE — Patient Instructions (Addendum)
Medication Instructions:  No changes  *If you need a refill on your cardiac medications before your next appointment, please call your pharmacy*  Lab Work: Not needed  Testing/Procedures: Not needed  Follow-Up: At [facility], you and your health needs are our priority.  As part of our continuing mission to provide you with exceptional heart care, we have created designated Provider Care Teams.  These Care Teams include your primary Cardiologist (physician) and Advanced Practice Providers (APPs -  Physician Assistants and Nurse Practitioners) who all work together to provide you with the care you need, when you need it.  Your next appointment:   As needed   The format for your next appointment:   Either In Person or Virtual  Provider:   Glenetta Hew, MD   Other instructions Contact primary  Doctor for chest pain

## 2019-03-31 ENCOUNTER — Encounter: Payer: Self-pay | Admitting: Cardiology

## 2019-03-31 NOTE — Assessment & Plan Note (Signed)
Is on low-dose atorvastatin, lipids are not quite at goal for someone her age and risk.  Would try to shoot for LDL closer to 100. Consider titration of statin, defer to PCP.

## 2019-03-31 NOTE — Assessment & Plan Note (Signed)
Despite being his significant pain, blood pressures controlled on amlodipine and metoprolol.  Defer to PCP.

## 2019-03-31 NOTE — Assessment & Plan Note (Signed)
Agree with her PCPs assessment that this chest pain is very much musculoskeletal in fact, probably muscular as it appears to be in the pectoralis minor distribution.  She says she has been having some rotator cuff type pains.  I would not recommend stress test evaluation for this pain that is quite clearly musculoskeletal and not anginal in nature.

## 2019-03-31 NOTE — Assessment & Plan Note (Signed)
Seem to be controlled with beta blocker. 

## 2019-03-31 NOTE — Assessment & Plan Note (Signed)
She had very similar symptoms back in 2017 that she has now.  Those symptoms are more central located where his current symptoms are more lateral.  She had a negative Myoview in 2018.  Current symptoms are not anginal in nature as airway lateral and reproducible.

## 2019-04-01 ENCOUNTER — Ambulatory Visit (INDEPENDENT_AMBULATORY_CARE_PROVIDER_SITE_OTHER): Payer: BC Managed Care – PPO | Admitting: Neurology

## 2019-04-01 ENCOUNTER — Other Ambulatory Visit: Payer: Self-pay

## 2019-04-01 DIAGNOSIS — R569 Unspecified convulsions: Secondary | ICD-10-CM | POA: Diagnosis not present

## 2019-04-01 DIAGNOSIS — R519 Headache, unspecified: Secondary | ICD-10-CM

## 2019-04-10 ENCOUNTER — Other Ambulatory Visit: Payer: Self-pay | Admitting: Family Medicine

## 2019-04-14 NOTE — Procedures (Signed)
ELECTROENCEPHALOGRAM REPORT  Date of Study: 04/01/2019  Patient's Name: Ruth Bell MRN: [id number] Date of Birth: December 06, 1960  Referring Provider: Dr. Ellouise Newer  Clinical History: This is a 59 year old woman with a history of psychogenic non-epileptic events, event-free off medication for 4 years, until she started having recurrent shaking episodes recently.  Medications: KEPPRA 500 MG tablet NEURONTIN 600 MG tablet NORVASC 2.5 MG tablet LIPITOR 10 MG tablet Azelastine HCl 0.15 % SOLN LIORESAL 10 MG tablet BUSPAR 7.5 MG tablet Butalbital-Acetaminophen 50-300 MG TABS CELEXA 20 MG tablet FLEXERIL 10 MG tablet VOLTAREN 75 MG EC tablet FAMVIR 500 MG tablet NORCO 5-325 MG tablet Metoprolol Tartrate 75 MG TABS ZOFRAN ODT 4 MG disintegrating tablet PROTONIX 40 MG MIRALAX packet DELTASONE t10 MG tablet SEROQUEL 300 MG tablet ZANTAC 150 MG tablet ZANAFLEX 4 MG tablet KENALOG 0.1 %   Technical Summary: A multichannel digital 1-hour EEG recording measured by the international 10-20 system with electrodes applied with paste and impedances below 5000 ohms performed in our laboratory with EKG monitoring in an awake and drowsy patient.  Hyperventilation was not performed. Photic stimulation was performed.  The digital EEG was referentially recorded, reformatted, and digitally filtered in a variety of bipolar and referential montages for optimal display.    Description: The patient is awake and drowsy during the recording.  During maximal wakefulness, there is a symmetric, medium voltage 10 Hz posterior dominant rhythm that attenuates with eye opening.  The record is symmetric.  During drowsiness, there is an increase in theta slowing of the background.  Sleep was not captured. Photic stimulation did not elicit any epileptiform abnormalities. During photic stimulation, the patient has her eyes closed and starts making grunting noises. She starts having low amplitude side to side head  shaking, upper body shaking, arms to her side. She turns to the left side with eyes closed, occasional body thrusting movements. She is unresponsive to questions. There is note of increased saliva production. Event lasts 4 minutes. She is drowsy and minimally responsive after. Electrographically, there were no EEG changes seen.   There were no epileptiform discharges or electrographic seizures seen.    EKG lead was unremarkable.  Impression: This 1-hour awake and drowsy EEG is normal. Episode of shaking and unresponsiveness did not show epileptiform correlate.  Clinical Correlation of the above findings is consistent with a psychogenic non-epileptic event with no EEG correlate, baseline EEG normal.   Ellouise Newer, M.D.

## 2019-08-25 ENCOUNTER — Ambulatory Visit: Payer: BC Managed Care – PPO | Admitting: Family Medicine

## 2019-10-05 ENCOUNTER — Ambulatory Visit: Payer: BLUE CROSS/BLUE SHIELD | Admitting: Neurology

## 2019-12-21 ENCOUNTER — Telehealth: Payer: Self-pay | Admitting: Medical

## 2019-12-21 ENCOUNTER — Ambulatory Visit (HOSPITAL_BASED_OUTPATIENT_CLINIC_OR_DEPARTMENT_OTHER)
Admission: RE | Admit: 2019-12-21 | Discharge: 2019-12-21 | Disposition: A | Discharge status: Home / Self Care | Payer: BLUE CROSS/BLUE SHIELD | Source: Ambulatory Visit | Attending: Medical | Admitting: Medical

## 2019-12-21 ENCOUNTER — Other Ambulatory Visit: Payer: Self-pay

## 2019-12-21 ENCOUNTER — Ambulatory Visit (INDEPENDENT_AMBULATORY_CARE_PROVIDER_SITE_OTHER): Payer: BLUE CROSS/BLUE SHIELD | Admitting: Medical

## 2019-12-21 VITALS — BP 158/103 | HR 99 | Resp 18 | Ht 62.0 in | Wt 105.2 lb

## 2019-12-21 DIAGNOSIS — I10 Essential (primary) hypertension: Secondary | ICD-10-CM

## 2019-12-21 DIAGNOSIS — M25512 Pain in left shoulder: Secondary | ICD-10-CM

## 2019-12-21 DIAGNOSIS — Z0000 Encounter for general adult medical examination without abnormal findings: Secondary | ICD-10-CM | POA: Diagnosis not present

## 2019-12-21 DIAGNOSIS — G40909 Epilepsy, unspecified, not intractable, without status epilepticus: Secondary | ICD-10-CM

## 2019-12-21 DIAGNOSIS — Z1231 Encounter for screening mammogram for malignant neoplasm of breast: Secondary | ICD-10-CM

## 2019-12-21 DIAGNOSIS — R519 Headache, unspecified: Secondary | ICD-10-CM | POA: Diagnosis not present

## 2019-12-21 DIAGNOSIS — Z8619 Personal history of other infectious and parasitic diseases: Secondary | ICD-10-CM

## 2019-12-21 MED ORDER — AMLODIPINE BESYLATE 5 MG PO TABS: 5.0000 mg | ORAL_TABLET | Freq: Every day | ORAL | 3 refills | Status: DC

## 2019-12-21 MED ORDER — FAMCICLOVIR 500 MG PO TABS: 500.0000 mg | ORAL_TABLET | Freq: Three times a day (TID) | ORAL | 0 refills | Status: DC

## 2019-12-21 MED ORDER — LEVETIRACETAM 500 MG PO TABS: ORAL_TABLET | ORAL | 3 refills | Status: DC

## 2019-12-21 NOTE — Telephone Encounter (Signed)
Ok. That is helpful. Actually she has 4 lesions on her skin that look possible like shingles and she is very sensitive on light touch. So probably having a lot of stress recently in light of suspicious shingles eruption.  Thanks,

## 2019-12-21 NOTE — Progress Notes (Signed)
Subjective:    Patient ID: Ruth Bell, female    DOB: 1961/01/17, 59 y.o.   MRN: [id number]  HPI  Pt in for cpe/wellness.  Pt will get flu vaccine today.   Pt overdue for mammogram.  Pt up to date on colonoscopy.  She also states she had recent seizure past Wednesday. Pt was at work. She felt head jerk and then manager saw her do this. So she went to line and took her to her office. She was seated and shaked. That night she woke up that night and seemed confused and was shaking.  Last saw Dr. Delice Lesch. This is a 59 year old right-handed woman with a history of hypertension, hyperlipidemia, anxiety, depression, migraines, psychogenic non-epileptic events, presenting for evaluation of seizures and headaches. She has a history of psychogenic non-epileptic events captured on EEG from 2011-2017, she has been off seizure medication and had been event-free for 4 years, until seizures recurred after her left wrist fracture. Her husband describes them as "pain-induced seizures." She had been restarted on Keppra 500mg  BID 2 weeks ago and denies any seizures in 3 weeks. I discussed the diagnosis of psychogenic non-epileptic events, stressor at this time appears to be pain due to recent fracture. Discussed that she will likely have no long-term need for seizure medication as pain resolves. She is reporting new headaches localized over the left side. CT head without contrast and a 1-hour EEG will be ordered. [place] driving laws were discussed with the patient, and she knows to stop driving after a seizure, until 6 months seizure-free. She does not drive. Follow-up in 6 months, she knows to call for any changes.   Thank you for allowing me to participate in the care of this patient. Please do not hesitate to call for any questions or concerns.  Pt states she is about to run out of keppra. She is about to run out.  Pt bp is up today. She has not been checking her bp recently.  Also she states she has  chronic tender scalp tenderness. She has hx of eye pain as well. Pain radiating down her left side neck.s       Review of Systems  Constitutional: Negative for chills, fatigue and fever.  HENT: Negative for congestion.   Respiratory: Negative for cough, chest tightness, shortness of breath and wheezing.   Cardiovascular: Negative for chest pain and palpitations.  Gastrointestinal: Negative for abdominal pain.  Genitourinary: Negative for dysuria, flank pain and frequency.  Musculoskeletal: Negative for back pain, neck pain and neck stiffness.       Left shoulder pain.  Skin:       Very senstivie scalp and left side of neck.  Neurological: Negative for dizziness, syncope, weakness and numbness.       Little shaky on both upper ext. On movment but not at rest.  Hematological: Negative for adenopathy. Does not bruise/bleed easily.  Psychiatric/Behavioral: Negative for behavioral problems and sleep disturbance. The patient is not nervous/anxious and is not hyperactive.        Objective:   Physical Exam  General Mental Status- Alert. General Appearance- Not in acute distress.    Neck Carotid Arteries- Normal color. Moisture- Normal Moisture. No carotid bruits. No JVD.  Chest and Lung Exam Auscultation: Breath Sounds:-Normal.  Cardiovascular Auscultation:Rythm- Regular. Murmurs & Other Heart Sounds:Auscultation of the heart reveals- No Murmurs.  Abdomen Inspection:-Inspeection Normal. Palpation/Percussion:Note:No mass. Palpation and Percussion of the abdomen reveal- Non Tender, Non Distended + BS, no  rebound or guarding.    Neurologic Cranial Nerve exam:- CN III-XII intact(No nystagmus), symmetric smile. Drift Test:- No drift. Finger to Nose:- Normal/Intact Strength:- 5/5 equal and symmetric strength both upper and lower extremities.  Left shoulder pain on abduction. Left side of neck- very tender to light palpation.  Skin- 4 raised skin lesions have flat vesicle  type appearance. Scalp skin very tender to palpation.    Assessment & Plan:  For you wellness exam today I have ordered cbc, cmp  and lipid panel.  Flu vaccine today.  Recommend exercise and healthy diet.  We will let you know lab results as they come in.  Follow up date appointment will be determined after lab review.   For elevated bp will increase amlodipine to 5 mg daily.  For recent seizure history I put in referral to your neurologist. Asking her if pending appointment do we need to increase dose or frequency.  Mammogram order placed today. For shingles rx famvir.  For shoulder pain left shoulder xray.  Follow up 3 weeks or as needed  [id number] charge in addition to wellness as adressed, Possible seizure, htn , shoulder pain and shingles.(on top of wellness exam)

## 2019-12-21 NOTE — Telephone Encounter (Signed)
Thanks for the update. I don't have any evidence so far that her seizures are epileptic, all the EEGs that have been done, even the one in the office in January 2021 captured a non-epileptic shaking episode with normal EEG throughout. Would not increase Keppra for these types of seizures. Are there any provoking factors, in her case, she was in a lot of pain which appeared to trigger stress response. Any triggers that can be addressed first (pain, stress, mood)? We'll get her in on waitlist. Thanks

## 2019-12-21 NOTE — Telephone Encounter (Signed)
Rx keppra sent to pt pharmacy.

## 2019-12-21 NOTE — Patient Instructions (Addendum)
For you wellness exam today I have ordered cbc, cmp  and lipid panel.  Flu vaccine today.  Recommend exercise and healthy diet.  We will let you know lab results as they come in.  Follow up date appointment will be determined after lab review.   For elevated bp will increase amlodipine to 5 mg daily.  For recent seizure history I put in referral to your neurologist. Asking her if pending appointment do we need to increase dose or frequency.  Mammogram order placed today. For shingles rx famvir.  For shoulder pain left shoulder xray.  Follow up 3 weeks or as needed   Preventive Care 68-82 Years Old, Female Preventive care refers to visits with your health care provider and lifestyle choices that can promote health and wellness. This includes:  A yearly physical exam. This may also be called an annual well check.  Regular dental visits and eye exams.  Immunizations.  Screening for certain conditions.  Healthy lifestyle choices, such as eating a healthy diet, getting regular exercise, not using drugs or products that contain nicotine and tobacco, and limiting alcohol use. What can I expect for my preventive care visit? Physical exam Your health care provider will check your:  Height and weight. This may be used to calculate body mass index (BMI), which tells if you are at a healthy weight.  Heart rate and blood pressure.  Skin for abnormal spots. Counseling Your health care provider may ask you questions about your:  Alcohol, tobacco, and drug use.  Emotional well-being.  Home and relationship well-being.  Sexual activity.  Eating habits.  Work and work Statistician.  Method of birth control.  Menstrual cycle.  Pregnancy history. What immunizations do I need?  Influenza (flu) vaccine  This is recommended every year. Tetanus, diphtheria, and pertussis (Tdap) vaccine  You may need a Td booster every 10 years. Varicella (chickenpox) vaccine  You may  need this if you have not been vaccinated. Zoster (shingles) vaccine  You may need this after age 4. Measles, mumps, and rubella (MMR) vaccine  You may need at least one dose of MMR if you were born in 1957 or later. You may also need a second dose. Pneumococcal conjugate (PCV13) vaccine  You may need this if you have certain conditions and were not previously vaccinated. Pneumococcal polysaccharide (PPSV23) vaccine  You may need one or two doses if you smoke cigarettes or if you have certain conditions. Meningococcal conjugate (MenACWY) vaccine  You may need this if you have certain conditions. Hepatitis A vaccine  You may need this if you have certain conditions or if you travel or work in places where you may be exposed to hepatitis A. Hepatitis B vaccine  You may need this if you have certain conditions or if you travel or work in places where you may be exposed to hepatitis B. Haemophilus influenzae type b (Hib) vaccine  You may need this if you have certain conditions. Human papillomavirus (HPV) vaccine  If recommended by your health care provider, you may need three doses over 6 months. You may receive vaccines as individual doses or as more than one vaccine together in one shot (combination vaccines). Talk with your health care provider about the risks and benefits of combination vaccines. What tests do I need? Blood tests  Lipid and cholesterol levels. These may be checked every 5 years, or more frequently if you are over 18 years old.  Hepatitis C test.  Hepatitis B test. Screening  Lung cancer screening. You may have this screening every year starting at age 58 if you have a 30-pack-year history of smoking and currently smoke or have quit within the past 15 years.  Colorectal cancer screening. All adults should have this screening starting at age 81 and continuing until age 104. Your health care provider may recommend screening at age 71 if you are at increased  risk. You will have tests every 1-10 years, depending on your results and the type of screening test.  Diabetes screening. This is done by checking your blood sugar (glucose) after you have not eaten for a while (fasting). You may have this done every 1-3 years.  Mammogram. This may be done every 1-2 years. Talk with your health care provider about when you should start having regular mammograms. This may depend on whether you have a family history of breast cancer.  BRCA-related cancer screening. This may be done if you have a family history of breast, ovarian, tubal, or peritoneal cancers.  Pelvic exam and Pap test. This may be done every 3 years starting at age 63. Starting at age 67, this may be done every 5 years if you have a Pap test in combination with an HPV test. Other tests  Sexually transmitted disease (STD) testing.  Bone density scan. This is done to screen for osteoporosis. You may have this scan if you are at high risk for osteoporosis. Follow these instructions at home: Eating and drinking  Eat a diet that includes fresh fruits and vegetables, whole grains, lean protein, and low-fat dairy.  Take vitamin and mineral supplements as recommended by your health care provider.  Do not drink alcohol if: ? Your health care provider tells you not to drink. ? You are pregnant, may be pregnant, or are planning to become pregnant.  If you drink alcohol: ? Limit how much you have to 0-1 drink a day. ? Be aware of how much alcohol is in your drink. In the [place] one drink equals one 12 oz bottle of beer (355 mL), one 5 oz glass of wine (148 mL), or one 1 oz glass of hard liquor (44 mL). Lifestyle  Take daily care of your teeth and gums.  Stay active. Exercise for at least 30 minutes on 5 or more days each week.  Do not use any products that contain nicotine or tobacco, such as cigarettes, e-cigarettes, and chewing tobacco. If you need help quitting, ask your health care  provider.  If you are sexually active, practice safe sex. Use a condom or other form of birth control (contraception) in order to prevent pregnancy and STIs (sexually transmitted infections).  If told by your health care provider, take low-dose aspirin daily starting at age 75. What's next?  Visit your health care provider once a year for a well check visit.  Ask your health care provider how often you should have your eyes and teeth checked.  Stay up to date on all vaccines. This information is not intended to replace advice given to you by your health care provider. Make sure you discuss any questions you have with your health care provider. Document Revised: 11/14/2017 Document Reviewed: 11/14/2017 Elsevier Patient Education  2020 [employer].

## 2019-12-21 NOTE — Telephone Encounter (Signed)
Dr Delice Lesch  Pt had seizure last week on Wednesday. Then later same night might have had second. She is on keppra 500 mg bid. She is about to run out. I put in referral to you. Pending appoitnment was wondering do you recommend increasing dose.  Thanks,  Mackie Pai, PA-C

## 2019-12-22 LAB — COMPREHENSIVE METABOLIC PANEL
AG Ratio: 1.5 (calc) (ref 1.0–2.5)
ALT: 12 U/L (ref 6–29)
AST: 16 U/L (ref 10–35)
Albumin: 4.6 g/dL (ref 3.6–5.1)
Alkaline phosphatase (APISO): 62 U/L (ref 37–153)
BUN: 11 mg/dL (ref 7–25)
CO2: 29 mmol/L (ref 20–32)
Calcium: 10 mg/dL (ref 8.6–10.4)
Chloride: 104 mmol/L (ref 98–110)
Creat: 0.58 mg/dL (ref 0.50–0.99)
Globulin: 3.1 g/dL (calc) (ref 1.9–3.7)
Glucose, Bld: 81 mg/dL (ref 65–99)
Potassium: 4.4 mmol/L (ref 3.5–5.3)
Sodium: 142 mmol/L (ref 135–146)
Total Bilirubin: 0.9 mg/dL (ref 0.2–1.2)
Total Protein: 7.7 g/dL (ref 6.1–8.1)

## 2019-12-22 LAB — CBC WITH DIFFERENTIAL/PLATELET
Absolute Monocytes: 504 cells/uL (ref 200–950)
Basophils Absolute: 54 cells/uL (ref 0–200)
Basophils Relative: 0.6 %
Eosinophils Absolute: 108 cells/uL (ref 15–500)
Eosinophils Relative: 1.2 %
HCT: 39.9 % (ref 35.0–45.0)
Hemoglobin: 13.3 g/dL (ref 11.7–15.5)
Lymphs Abs: 2826 cells/uL (ref 850–3900)
MCH: 28.2 pg (ref 27.0–33.0)
MCHC: 33.3 g/dL (ref 32.0–36.0)
MCV: 84.7 fL (ref 80.0–100.0)
MPV: 9.2 fL (ref 7.5–12.5)
Monocytes Relative: 5.6 %
Neutro Abs: 5508 cells/uL (ref 1500–7800)
Neutrophils Relative %: 61.2 %
Platelets: 305 10*3/uL (ref 140–400)
RBC: 4.71 10*6/uL (ref 3.80–5.10)
RDW: 14.2 % (ref 11.0–15.0)
Total Lymphocyte: 31.4 %
WBC: 9 10*3/uL (ref 3.8–10.8)

## 2019-12-22 LAB — LIPID PANEL
Cholesterol: 220 mg/dL — ABNORMAL HIGH (ref <200)
HDL: 68 mg/dL (ref >=50)
LDL Cholesterol (Calc): 133 mg/dL (calc) — ABNORMAL HIGH
Non-HDL Cholesterol (Calc): 152 mg/dL (calc) — ABNORMAL HIGH (ref <130)
Total CHOL/HDL Ratio: 3.2 (calc) (ref <5.0)
Triglycerides: 86 mg/dL (ref <150)

## 2019-12-22 LAB — SEDIMENTATION RATE: Sed Rate: 2 mm/h (ref 0–30)

## 2019-12-29 ENCOUNTER — Other Ambulatory Visit: Payer: Self-pay | Admitting: Medical

## 2019-12-29 DIAGNOSIS — Z1231 Encounter for screening mammogram for malignant neoplasm of breast: Secondary | ICD-10-CM

## 2020-02-08 ENCOUNTER — Encounter (HOSPITAL_BASED_OUTPATIENT_CLINIC_OR_DEPARTMENT_OTHER): Payer: Self-pay

## 2020-02-08 ENCOUNTER — Other Ambulatory Visit: Payer: Self-pay

## 2020-02-08 ENCOUNTER — Ambulatory Visit (HOSPITAL_BASED_OUTPATIENT_CLINIC_OR_DEPARTMENT_OTHER)
Admission: RE | Admit: 2020-02-08 | Discharge: 2020-02-08 | Disposition: A | Discharge status: Home / Self Care | Payer: BLUE CROSS/BLUE SHIELD | Source: Ambulatory Visit | Attending: Medical | Admitting: Medical

## 2020-02-08 DIAGNOSIS — Z1231 Encounter for screening mammogram for malignant neoplasm of breast: Secondary | ICD-10-CM

## 2020-02-09 ENCOUNTER — Ambulatory Visit (HOSPITAL_BASED_OUTPATIENT_CLINIC_OR_DEPARTMENT_OTHER): Payer: BLUE CROSS/BLUE SHIELD

## 2020-03-17 ENCOUNTER — Other Ambulatory Visit: Payer: Self-pay | Admitting: Medical

## 2020-03-25 ENCOUNTER — Emergency Department (HOSPITAL_COMMUNITY): Payer: 59

## 2020-03-25 ENCOUNTER — Emergency Department (HOSPITAL_COMMUNITY)
Admission: EM | Admit: 2020-03-25 | Discharge: 2020-03-25 | Disposition: A | Discharge status: Home / Self Care | Payer: 59 | Attending: Emergency Medicine | Admitting: Emergency Medicine

## 2020-03-25 DIAGNOSIS — R569 Unspecified convulsions: Secondary | ICD-10-CM | POA: Insufficient documentation

## 2020-03-25 DIAGNOSIS — R0789 Other chest pain: Secondary | ICD-10-CM | POA: Diagnosis not present

## 2020-03-25 DIAGNOSIS — I10 Essential (primary) hypertension: Secondary | ICD-10-CM | POA: Diagnosis not present

## 2020-03-25 DIAGNOSIS — Z79899 Other long term (current) drug therapy: Secondary | ICD-10-CM | POA: Diagnosis not present

## 2020-03-25 DIAGNOSIS — F17210 Nicotine dependence, cigarettes, uncomplicated: Secondary | ICD-10-CM | POA: Insufficient documentation

## 2020-03-25 DIAGNOSIS — Z7982 Long term (current) use of aspirin: Secondary | ICD-10-CM | POA: Insufficient documentation

## 2020-03-25 LAB — CBC WITH DIFFERENTIAL/PLATELET
Abs Immature Granulocytes: 0.03 10*3/uL (ref 0.00–0.07)
Basophils Absolute: 0.1 10*3/uL (ref 0.0–0.1)
Basophils Relative: 1 %
Eosinophils Absolute: 0.1 10*3/uL (ref 0.0–0.5)
Eosinophils Relative: 1 %
HCT: 43.5 % (ref 36.0–46.0)
Hemoglobin: 13.9 g/dL (ref 12.0–15.0)
Immature Granulocytes: 0 %
Lymphocytes Relative: 32 %
Lymphs Abs: 2.6 10*3/uL (ref 0.7–4.0)
MCH: 27.5 pg (ref 26.0–34.0)
MCHC: 32 g/dL (ref 30.0–36.0)
MCV: 86.1 fL (ref 80.0–100.0)
Monocytes Absolute: 0.5 10*3/uL (ref 0.1–1.0)
Monocytes Relative: 6 %
Neutro Abs: 5 10*3/uL (ref 1.7–7.7)
Neutrophils Relative %: 60 %
Platelets: 298 10*3/uL (ref 150–400)
RBC: 5.05 MIL/uL (ref 3.87–5.11)
RDW: 14.5 % (ref 11.5–15.5)
WBC: 8.2 10*3/uL (ref 4.0–10.5)
nRBC: 0 % (ref 0.0–0.2)

## 2020-03-25 LAB — BASIC METABOLIC PANEL
Anion gap: 10 (ref 5–15)
BUN: 7 mg/dL — ABNORMAL LOW (ref 8–23)
CO2: 24 mmol/L (ref 22–32)
Calcium: 9.2 mg/dL (ref 8.9–10.3)
Chloride: 106 mmol/L (ref 98–111)
Creatinine, Ser: 0.63 mg/dL (ref 0.44–1.00)
GFR, Estimated: >60 mL/min (ref >60)
Glucose, Bld: 98 mg/dL (ref 70–99)
Potassium: 3.8 mmol/L (ref 3.5–5.1)
Sodium: 140 mmol/L (ref 135–145)

## 2020-03-25 LAB — TROPONIN I (HIGH SENSITIVITY): Troponin I (High Sensitivity): 4 ng/L (ref <18)

## 2020-03-25 LAB — MAGNESIUM: Magnesium: 2.2 mg/dL (ref 1.7–2.4)

## 2020-03-25 MED ORDER — PREDNISONE 10 MG PO TABS: 40.0000 mg | ORAL_TABLET | Freq: Every day | ORAL | 0 refills | Status: DC

## 2020-03-25 MED ORDER — MORPHINE SULFATE (PF) 2 MG/ML IV SOLN
2.0000 mg | Freq: Once | INTRAVENOUS | Status: DC
  Filled 2020-03-25: qty 1

## 2020-03-25 MED ORDER — KETOROLAC TROMETHAMINE 15 MG/ML IJ SOLN
15.0000 mg | Freq: Once | INTRAMUSCULAR | Status: AC
  Administered 2020-03-25: 15 mg via INTRAVENOUS
  Filled 2020-03-25: qty 1

## 2020-03-25 MED ORDER — LORAZEPAM 2 MG/ML IJ SOLN
INTRAMUSCULAR | Status: AC
  Administered 2020-03-25: 2 mg
  Filled 2020-03-25: qty 1

## 2020-03-25 MED ORDER — LEVETIRACETAM IN NACL 1000 MG/100ML IV SOLN
1000.0000 mg | Freq: Once | INTRAVENOUS | Status: AC
  Administered 2020-03-25: 1000 mg via INTRAVENOUS
  Filled 2020-03-25: qty 100

## 2020-03-25 MED ORDER — IBUPROFEN 600 MG PO TABS: 600.0000 mg | ORAL_TABLET | Freq: Four times a day (QID) | ORAL | 0 refills | Status: DC | PRN

## 2020-03-25 MED ORDER — DEXAMETHASONE SODIUM PHOSPHATE 10 MG/ML IJ SOLN
8.0000 mg | Freq: Once | INTRAMUSCULAR | Status: AC
  Administered 2020-03-25: 8 mg via INTRAVENOUS
  Filled 2020-03-25: qty 1

## 2020-03-25 NOTE — ED Notes (Signed)
Patient transported to CT in stable condition. CT aware of recent seizure.

## 2020-03-25 NOTE — ED Notes (Signed)
MD at bedside. 

## 2020-03-25 NOTE — ED Notes (Signed)
Husband requesting to take patient home. Explained we are waiting for Neurology to call back. Patient continues to sleep.

## 2020-03-25 NOTE — ED Triage Notes (Signed)
Pt found on floor at work. Hx seizures. 2 witnessed seizures with MEDIC. Has not had seizure meds for 2 weeks.

## 2020-03-25 NOTE — ED Provider Notes (Signed)
[facility]ERGENCY DEPARTMENT Provider Note   CSN: [id number] Arrival date & time: 03/25/20  1005     History CC:  Chest pain, seizure like activity   Ruth Bell is a 60 y.o. female with a history of seizures on Keppra presenting to the emergency department with concern for seizure while at work today.  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  Bystanders reported tonic-clonic activity.  EMS reported patient was postictal on their arrival.  At time she arrived to the ED, patient is now awake and verbalizing.  She states it feels like she had a seizure.  She describes myalgias all over her body.  She reports that she has not taken her seizure medicine in approximately 2 weeks due to drowsiness caused by this medication, making it difficult for her to work.  She was prescribed Keppra 500 mg twice daily.  She states she suffers from chronic neck pain in the bilateral mid thoracic back and upper neck.  This is worse with movement of arms and legs.  She feels like the pain is worsened since her seizure.  She reports that the pain in her upper mid thoracic back seems to radiate around towards the front of her chest, worse with breathing and movement.  She also describes pain in her right shoulder which is worse with movement and breathing.  She states that shoulder pain is new issues never had this before.  Its intensity is currently 9 out of 10.  HPI     Past Medical History:  Diagnosis Date  . Anemia   . Anxiety   . Arm skin lesion, left 09/05/2014  . Constipation 12/27/2013  . Depression   . Dysphagia 04/15/2016  . Eczema 08/22/2015  . GERD (gastroesophageal reflux disease)   . Hematuria 03/04/2016  . History of shingles 01/22/2016  . Hyperglycemia 01/15/2015  . Hyperlipidemia, mixed 08/22/2015  . Hypertension   . Menometrorrhagia 2006  . Migraine, unspecified, without mention of intractable  migraine without mention of status migrainosus   . Preventative health care 11/04/2016  . Pseudoseizures ([place])   . Seizures ([place])    history of pseudoseizure disorder, last last seizure 3 wks, keppra inc in dosage  . Tubular adenoma of colon 05/2011  . Uterine leiomyoma 2006    Patient Active Problem List   Diagnosis Date Noted  . Urinary frequency 05/07/2017  . Abdominal bloating 05/07/2017  . Back pain 03/21/2017  . Tachycardia 02/28/2017  . Preventative health care 11/04/2016  . Hoarseness 04/15/2016  . Dysphagia 04/15/2016  . Anterior chest wall pain 04/13/2016  . Dysuria 03/04/2016  . Hematuria 03/04/2016  . Palpitations 01/30/2016  . History of shingles 01/22/2016  . Eczema 08/22/2015  . Hyperlipidemia, mixed 08/22/2015  . Hyperglycemia 01/15/2015  . Lumbar radiculopathy 11/19/2014  . Arm skin lesion, left 09/05/2014  . Noncompliance with medications 08/11/2014  . Constipation 12/27/2013  . Pain in joint, shoulder region 10/13/2013  . Shoulder pain 09/23/2013  . Numbness and tingling in right hand 09/23/2013  . Jaw pain 03/26/2013  . Neck pain 05/17/2012  . Seizure disorder ([place]) 02/21/2012  . Non compliance w medication regimen 02/21/2012  . Essential hypertension 09/06/2011  . Abdominal pain 06/20/2011  . Hemorrhoids 06/20/2011  . Hx of adenomatous colonic polyps 06/13/2011  . Musculoskeletal chest pain 04/23/2011  . Radiculopathy of leg 02/02/2011  . History of pseudoseizure 07/10/2010  . Anxiety and depression  12/29/2009  . TINEA PEDIS 06/02/2009  . Exertional dyspnea 01/13/2008  . MICROSCOPIC HEMATURIA 06/24/2007  . WEIGHT LOSS 04/28/2007  . Anemia 03/10/2007  . Migraine 03/10/2007  . ARTHRITIS 03/10/2007  . SYNCOPE 03/10/2007    Past Surgical History:  Procedure Laterality Date  . ABDOMINAL HYSTERECTOMY  04/06/04  . APPENDECTOMY    . BILATERAL SALPINGOOPHORECTOMY  04/06/04  . CARDIAC EVENT MONITOR  02/2016   Mostly sinus bradycardia and sinus  rhythm.  Rare PVCs and PACs.  No arrhythmias.  Symptoms of chest pain and fluttering as well as "passed out spell" noted with normal sinus rhythm.  Marland Kitchen. MASS EXCISION Left 06/02/2015   Procedure: EXCISION LEFT ARM MASS;  Surgeon: Chevis PrettyPaul Toth III, MD;  Location: [facility];  Service: General;  Laterality: Left;  . NM MYOVIEW LTD  04/2016   Reached heart rate of 127 bpm with Lexiscan.  EF 60 to 65%.  LOW RISK.  No ischemia or infarction.  Marland Kitchen. ROTATOR CUFF REPAIR    . TRANSTHORACIC ECHOCARDIOGRAM  01/2016   F 55-6%.  No R WMA.  GR 1 DD.  Normal valves.     OB History   No obstetric history on file.     Family History  Problem Relation Age of Onset  . Heart disease Mother   . Heart attack Mother   . Cancer Father        colon  . Hypertension Father   . Cancer Sister        brain  . Cancer Brother   . Hypertension Sister   . Cancer Sister        unsure of type  . Diabetes Sister        type 2  . Heart attack Sister   . Hypertension Brother   . Colon cancer Neg Hx     Social History   Tobacco Use  . Smoking status: Light Tobacco Smoker    Packs/day: 0.50    Years: 40.00    Pack years: 20.00    Types: Cigarettes    Start date: 12/18/2015  . Smokeless tobacco: Never Used  . Tobacco comment: 3-4 cigarettes daily  Vaping Use  . Vaping Use: Never used  Substance Use Topics  . Alcohol use: Yes  . Drug use: No    Home Medications Prior to Admission medications   Medication Sig Start Date End Date Taking? Authorizing Provider  amLODipine (NORVASC) 5 MG tablet Take 1 tablet (5 mg total) by mouth daily. 03/17/20  Yes Bradd CanaryBlyth, Stacey A, MD  Aspirin-Caffeine (BAYER BACK & BODY) 500-32.5 MG TABS Take 1 tablet by mouth 2 (two) times daily as needed (pain).   Yes [provider]  gabapentin (NEURONTIN) 300 MG capsule Take 300 mg by mouth 3 (three) times daily.   Yes [provider]  ibuprofen (ADVIL) 600 MG tablet Take 1 tablet (600 mg total) by mouth every  6 (six) hours as needed for up to 30 doses for mild pain or moderate pain. 03/25/20  Yes Terald Sleeperrifan, Marcell Chavarin J, MD  levETIRAcetam (KEPPRA) 500 MG tablet TAKE 1 TABLET (500 MG TOTAL) BY MOUTH TWO (2) TIMES A DAY. Patient taking differently: Take 500 mg by mouth 2 (two) times daily. 12/21/19  Yes Saguier, Ramon DredgeEdward, PA-C  predniSONE (DELTASONE) 10 MG tablet Take 4 tablets (40 mg total) by mouth daily for 4 days. 03/26/20 03/30/20 Yes Menashe Kafer, Kermit BaloMatthew J, MD  atorvastatin (LIPITOR) 10 MG tablet TAKE 1 TABLET BY MOUTH  DAILY Patient  not taking: No sig reported 11/26/17   Mosie Lukes, MD  Butalbital-Acetaminophen 50-300 MG TABS Take 1 tablet by mouth 2 (two) times daily as needed. Patient not taking: No sig reported 01/29/19   Mosie Lukes, MD  cyclobenzaprine (FLEXERIL) 10 MG tablet Take 1 tablet (10 mg total) by mouth at bedtime. Please dc the 90 day rx with one refill. Only give 30 tabs. Patient not taking: No sig reported 11/07/17   Saguier, Percell Miller, PA-C  famciclovir (FAMVIR) 500 MG tablet Take 1 tablet (500 mg total) by mouth 3 (three) times daily. Patient not taking: Reported on 03/25/2020 12/21/19   Saguier, Percell Miller, PA-C  pantoprazole (PROTONIX) 40 MG tablet Take 1 tablet (40 mg total) by mouth daily. Patient not taking: No sig reported 12/09/17   Mosie Lukes, MD  tiZANidine (ZANAFLEX) 4 MG tablet Take 1 tablet (4 mg total) by mouth every 6 (six) hours as needed. Patient not taking: No sig reported 12/04/17   Hudnall, Sharyn Lull, MD    Allergies    Patient has no known allergies.  Review of Systems   Review of Systems  Constitutional: Negative for chills and fever.  Eyes: Negative for photophobia and visual disturbance.  Respiratory: Negative for cough and shortness of breath.   Cardiovascular: Negative for chest pain and palpitations.  Gastrointestinal: Negative for abdominal pain and vomiting.  Genitourinary: Negative for dysuria and hematuria.  Musculoskeletal: Positive for arthralgias, back  pain, myalgias and neck pain.  Skin: Negative for rash and wound.  Neurological: Positive for seizures and headaches.  Psychiatric/Behavioral: Negative for agitation and confusion.  All other systems reviewed and are negative.   Physical Exam Updated Vital Signs BP 136/70   Pulse 65   Temp 97.8 F (36.6 C) (Oral)   Resp (!) 22   LMP  (LMP Unknown)   SpO2 96%   Physical Exam Constitutional:      General: She is not in acute distress. HENT:     Head: Normocephalic and atraumatic.  Eyes:     Conjunctiva/sclera: Conjunctivae normal.     Pupils: Pupils are equal, round, and reactive to light.  Neck:     Comments: Bilateral paraspinal ttp Trigger point trapezius ttp Cardiovascular:     Rate and Rhythm: Normal rate and regular rhythm.     Pulses: Normal pulses.  Pulmonary:     Effort: Pulmonary effort is normal. No respiratory distress.  Abdominal:     General: There is no distension.     Tenderness: There is no abdominal tenderness.  Skin:    General: Skin is warm and dry.  Neurological:     General: No focal deficit present.     Mental Status: She is alert and oriented to person, place, and time. Mental status is at baseline.  Psychiatric:        Mood and Affect: Mood normal.        Behavior: Behavior normal.     ED Results / Procedures / Treatments   Labs (all labs ordered are listed, but only abnormal results are displayed) Labs Reviewed  BASIC METABOLIC PANEL - Abnormal; Notable for the following components:      Result Value   BUN 7 (*)    All other components within normal limits  CBC WITH DIFFERENTIAL/PLATELET  MAGNESIUM  TROPONIN I (HIGH SENSITIVITY)    EKG EKG Interpretation  Date/Time:  Friday March 25 2020 10:19:29 EST Ventricular Rate:  64 PR Interval:    QRS Duration: 83 QT  Interval:  408 QTC Calculation: 421 R Axis:   69 Text Interpretation: Sinus rhythm Probable left atrial enlargement RSR' in V1 or V2, probably normal variant No STEMI  Confirmed by Octaviano Glow [phone]) on 03/25/2020 10:29:30 AM   Radiology CT Head Wo Contrast  Result Date: 03/25/2020 CLINICAL DATA:  Found on floor at work.  History of seizures. EXAM: CT HEAD WITHOUT CONTRAST CT CERVICAL SPINE WITHOUT CONTRAST TECHNIQUE: Multidetector CT imaging of the head and cervical spine was performed following the standard protocol without intravenous contrast. Multiplanar CT image reconstructions of the cervical spine were also generated. COMPARISON:  Head and cervical CT dated 01/10/2016. FINDINGS: CT HEAD FINDINGS Brain: No evidence of acute infarction, hemorrhage, hydrocephalus, extra-axial collection or mass lesion/mass effect. Vascular: No hyperdense vessel or unexpected calcification. Skull: Normal. Negative for fracture or focal lesion. Sinuses/Orbits: Globes and orbits are unremarkable. Visualized sinuses are clear. Other: None. CT CERVICAL SPINE FINDINGS Alignment: Normal. Skull base and vertebrae: No acute fracture. No primary bone lesion or focal pathologic process. Soft tissues and spinal canal: No prevertebral fluid or swelling. No visible canal hematoma. Disc levels: Minor loss of disc height at C5-C6. No other disc degenerative change. There are bilateral facet degenerative changes, greatest on the left at C2-C3 and C3-C4. No convincing disc herniation. Upper chest: No acute findings. Pleuroparenchymal scarring at the lung apices, the 7 mm nodular component to this on the right, stable from the exam dated 01/10/2016. Other: None. IMPRESSION: HEAD CT 1. Normal. CERVICAL CT 1. No fracture or acute finding. Electronically Signed   By: Lajean Manes M.D.   On: 03/25/2020 11:54   CT Cervical Spine Wo Contrast  Result Date: 03/25/2020 CLINICAL DATA:  Found on floor at work.  History of seizures. EXAM: CT HEAD WITHOUT CONTRAST CT CERVICAL SPINE WITHOUT CONTRAST TECHNIQUE: Multidetector CT imaging of the head and cervical spine was performed following the standard protocol  without intravenous contrast. Multiplanar CT image reconstructions of the cervical spine were also generated. COMPARISON:  Head and cervical CT dated 01/10/2016. FINDINGS: CT HEAD FINDINGS Brain: No evidence of acute infarction, hemorrhage, hydrocephalus, extra-axial collection or mass lesion/mass effect. Vascular: No hyperdense vessel or unexpected calcification. Skull: Normal. Negative for fracture or focal lesion. Sinuses/Orbits: Globes and orbits are unremarkable. Visualized sinuses are clear. Other: None. CT CERVICAL SPINE FINDINGS Alignment: Normal. Skull base and vertebrae: No acute fracture. No primary bone lesion or focal pathologic process. Soft tissues and spinal canal: No prevertebral fluid or swelling. No visible canal hematoma. Disc levels: Minor loss of disc height at C5-C6. No other disc degenerative change. There are bilateral facet degenerative changes, greatest on the left at C2-C3 and C3-C4. No convincing disc herniation. Upper chest: No acute findings. Pleuroparenchymal scarring at the lung apices, the 7 mm nodular component to this on the right, stable from the exam dated 01/10/2016. Other: None. IMPRESSION: HEAD CT 1. Normal. CERVICAL CT 1. No fracture or acute finding. Electronically Signed   By: Lajean Manes M.D.   On: 03/25/2020 11:54   DG Chest Portable 1 View  Result Date: 03/25/2020 CLINICAL DATA:  Chest pain EXAM: PORTABLE CHEST 1 VIEW COMPARISON:  01/10/2016 FINDINGS: Normal heart size and mediastinal contours. No acute infiltrate or edema. No effusion or pneumothorax. No acute osseous findings. IMPRESSION: Negative chest. Electronically Signed   By: Monte Fantasia M.D.   On: 03/25/2020 10:58    Procedures Procedures (including critical care time)  Medications Ordered in ED Medications  dexamethasone (DECADRON) injection 8 mg (  8 mg Intravenous Given 03/25/20 1111)  ketorolac (TORADOL) 15 MG/ML injection 15 mg (15 mg Intravenous Given 03/25/20 1111)  levETIRAcetam (KEPPRA)  IVPB 1000 mg/100 mL premix (0 mg Intravenous Stopped 03/25/20 1111)  LORazepam (ATIVAN) 2 MG/ML injection (2 mg  Given 03/25/20 1054)    ED Course  I have reviewed the triage vital signs and the nursing notes.  Pertinent labs & imaging results that were available during my care of the patient were reviewed by me and considered in my medical decision making (see chart for details).  60 yo female here with suspected seizure-like activity while at work today Now back to baseline mental state  Per neurology record review (see below), her history appears strongly consistent with pseudoseizures.  She's had 3 witnessed episodes while on EEG with no epileptiform activity.  The last note by Dr Delice Lesch questions whether these are true seizures.  I will discuss whether to continue AED's with the on-call neurologist.  In the meantime, the patient was given ativan IV here during another episode, with resolution.    She is also complaining of chest pain.  DDx includes cervical angina (most likely given the origin of her pain) vs ACS vs PTX vs other She has significant positional pain with any movement of her head or arms.  This may be 2/2 bad muscle spasms in the neck or else radiculopathy from the lower C spine.  I've ordered 15 mg IV toradol, and 8 mg IV decadron for these symptoms.  Labs ordered and reviewed showing no remarkable abnormalities.  Electrolytes wnl.  No evidence of infection at this time. DG chest ordered for chest pain, and reviewed showing no focal findings CT C-spine ordered to evaluate for occult fx - no acute fx, some facet disease noted, which may be a cause of her pain CTH ordered for syncope vs seizure - no acute findings ECG personally reviewed showing NSR with no acute ischemic findings  I've reviewed neurology notes from Dr Delice Lesch - patient had EEG on 04/01/19 with shaking episode, no corresponding epileptiform activity.  Per Dr Delice Lesch, patient noted to have "psychogenic non-epileptic  events" consistent with pseudoseizures.  Prior to this in 2016 Dr Leonel Ramsay from neurology obtained an EEG with 2 witnessed episodes which were non-epileptiform per his report. Her PCP has maintained her on keppra 500 mg BID, but she has not seen a neurologist in the office in at least 1 year.  Clinical Course as of 03/25/20 1836  Fri Mar 25, 2020  1109 Called into room as patient was having another generalized shaking episode.  This lasted approx 1-2 minutes, moving arms and legs rhythmically.  Subsequently patient opened eyes and tracking me, but appeared confused.  IV ativan ordered during episode. [MT]  1253 K hemolyzed, ordered repeat [MT]  1517 Labs unremarkable.  No further shaking episodes.  Pt still groggy from ativan but awake and talking.  We'll try to ambulate, if able she can be discharged home with husband and placed in bed.   [MT]  [id number] I spoke to Dr Lorrin Goodell and subsequently her husband regarding her prior EEGs and workup.  Her history seems most consistent with pseudoseizures.  Given that she is having adverse side effects to Keppra, both Dr Lorrin Goodell and I feel she would be okay being off AED's.  I placed a referral back into the neurology clinic, and encouraged her husband to schedule the next available appointment with a neurologist.  They verbalized understanding with this plan. [MT]  Clinical Course User Index [MT] Devell Parkerson, Carola Rhine, MD    Final Clinical Impression(s) / ED Diagnoses Final diagnoses:  Seizure-like activity [facility])    Rx / DC Orders ED Discharge Orders         Ordered    predniSONE (DELTASONE) 10 MG tablet  Daily        03/25/20 1525    Ambulatory referral to Neurology       Comments: An appointment is requested in approximately: 1 week Seen in ED for seizure-like activity - possible pseudoseizures based on prior EEG history - stopped AED's, and advised outpatient close neuro follow up   03/25/20 1521    ibuprofen (ADVIL) 600 MG tablet  Every 6  hours PRN        03/25/20 1525           Wyvonnia Dusky, MD 03/25/20 1836

## 2020-03-25 NOTE — ED Notes (Signed)
Pt resting, Ativan given Keppra hanging IV now

## 2020-03-25 NOTE — ED Notes (Signed)
Pt actively seizing. 2mg  Ativan ordered.

## 2020-03-25 NOTE — Discharge Instructions (Addendum)
You were seen in the emergency department after neck pain and a shaking episode at work.  We did blood tests which were normal.  We did a CT scan of your brain and your upper neck, which did not show any sign of brain bleed or fracture.  You were given some Ativan for a possible seizure, which will make you drowsy today.    I talked to your husband about your workup for seizures in the past.  It is possible that you are suffering from what is known as pseudoseizures, which are not the type of seizures that need to be treated with medications.  I included information for you to read about this condition.  I talked to our neurologist and we agreed it is okay for you to stop your Keppra medication. However, it is extremely important that you call and schedule a close follow-up appointment with your neurologists.  I placed the referral in their system.  If you do not hear from them by Monday, please call to make a follow-up appointment.  For your chest and shoulder pain, I suspect this is coming from your back and neck.  I prescribed a few more days of steroids (prednisone) to help.  Your next dose is tomorrow morning.  You can also take ibuprofen 600 mg every 6 hours as prescribed.  You can use icy hot, Bengay, or any other muscle rub on your neck and shoulders.  Try to avoid spending a lot of time looking down, or straining your neck forward, which can put stress on your spine.

## 2020-03-28 ENCOUNTER — Other Ambulatory Visit: Payer: Self-pay

## 2020-03-29 ENCOUNTER — Other Ambulatory Visit (HOSPITAL_BASED_OUTPATIENT_CLINIC_OR_DEPARTMENT_OTHER): Payer: Self-pay | Admitting: Internal Medicine

## 2020-03-29 ENCOUNTER — Ambulatory Visit (INDEPENDENT_AMBULATORY_CARE_PROVIDER_SITE_OTHER): Payer: 59 | Admitting: Family

## 2020-03-29 ENCOUNTER — Telehealth (INDEPENDENT_AMBULATORY_CARE_PROVIDER_SITE_OTHER): Payer: 59 | Admitting: Neurology

## 2020-03-29 ENCOUNTER — Ambulatory Visit (HOSPITAL_BASED_OUTPATIENT_CLINIC_OR_DEPARTMENT_OTHER)
Admission: RE | Admit: 2020-03-29 | Discharge: 2020-03-29 | Disposition: A | Discharge status: Home / Self Care | Payer: 59 | Source: Ambulatory Visit | Attending: Family | Admitting: Family

## 2020-03-29 ENCOUNTER — Encounter: Payer: Self-pay | Admitting: Neurology

## 2020-03-29 ENCOUNTER — Ambulatory Visit: Payer: 59 | Attending: Internal Medicine

## 2020-03-29 VITALS — Ht 65.0 in | Wt 105.0 lb

## 2020-03-29 VITALS — BP 181/84 | HR 61 | Temp 98.4°F | Resp 18 | Wt 102.8 lb

## 2020-03-29 DIAGNOSIS — M79672 Pain in left foot: Secondary | ICD-10-CM | POA: Insufficient documentation

## 2020-03-29 DIAGNOSIS — L819 Disorder of pigmentation, unspecified: Secondary | ICD-10-CM | POA: Diagnosis not present

## 2020-03-29 DIAGNOSIS — F445 Conversion disorder with seizures or convulsions: Secondary | ICD-10-CM | POA: Diagnosis not present

## 2020-03-29 DIAGNOSIS — M109 Gout, unspecified: Secondary | ICD-10-CM | POA: Diagnosis not present

## 2020-03-29 DIAGNOSIS — Z23 Encounter for immunization: Secondary | ICD-10-CM

## 2020-03-29 LAB — URIC ACID: Uric Acid, Serum: 3.2 mg/dL (ref 2.4–7.0)

## 2020-03-29 MED ORDER — COLCHICINE 0.6 MG PO TABS: ORAL_TABLET | ORAL | 0 refills | Status: DC

## 2020-03-29 MED FILL — MODERNA COVID-19 VACCINE 10: 100 | 28 days supply | Qty: 0 | Fill #0

## 2020-03-29 NOTE — Patient Instructions (Signed)
Take colchicine as directed for likely gout. Hold atorvastatin tonight. You may restart after you complete colchicine.   Gout  Gout is a condition that causes painful swelling of the joints. Gout is a type of inflammation of the joints (arthritis). This condition is caused by having too much uric acid in the body. Uric acid is a chemical that forms when the body breaks down substances called purines. Purines are important for building body proteins. When the body has too much uric acid, sharp crystals can form and build up inside the joints. This causes pain and swelling. Gout attacks can happen quickly and may be very painful (acute gout). Over time, the attacks can affect more joints and become more frequent (chronic gout). Gout can also cause uric acid to build up under the skin and inside the kidneys. What are the causes? This condition is caused by too much uric acid in your blood. This can happen because:  Your kidneys do not remove enough uric acid from your blood. This is the most common cause.  Your body makes too much uric acid. This can happen with some cancers and cancer treatments. It can also occur if your body is breaking down too many red blood cells (hemolytic anemia).  You eat too many foods that are high in purines. These foods include organ meats and some seafood. Alcohol, especially beer, is also high in purines. A gout attack may be triggered by trauma or stress. What increases the risk? You are more likely to develop this condition if you:  Have a family history of gout.  Are female and middle-aged.  Are female and have gone through menopause.  Are obese.  Frequently drink alcohol, especially beer.  Are dehydrated.  Lose weight too quickly.  Have an organ transplant.  Have lead poisoning.  Take certain medicines, including aspirin, cyclosporine, diuretics, levodopa, and niacin.  Have kidney disease.  Have a skin condition called psoriasis. What are the  signs or symptoms? An attack of acute gout happens quickly. It usually occurs in just one joint. The most common place is the big toe. Attacks often start at night. Other joints that may be affected include joints of the feet, ankle, knee, fingers, wrist, or elbow. Symptoms of this condition may include:  Severe pain.  Warmth.  Swelling.  Stiffness.  Tenderness. The affected joint may be very painful to touch.  Shiny, red, or purple skin.  Chills and fever. Chronic gout may cause symptoms more frequently. More joints may be involved. You may also have white or yellow lumps (tophi) on your hands or feet or in other areas near your joints.   How is this diagnosed? This condition is diagnosed based on your symptoms, medical history, and physical exam. You may have tests, such as:  Blood tests to measure uric acid levels.  Removal of joint fluid with a thin needle (aspiration) to look for uric acid crystals.  X-rays to look for joint damage. How is this treated? Treatment for this condition has two phases: treating an acute attack and preventing future attacks. Acute gout treatment may include medicines to reduce pain and swelling, including:  NSAIDs.  Steroids. These are strong anti-inflammatory medicines that can be taken by mouth (orally) or injected into a joint.  Colchicine. This medicine relieves pain and swelling when it is taken soon after an attack. It can be given by mouth or through an IV. Preventive treatment may include:  Daily use of smaller doses of NSAIDs or  colchicine.  Use of a medicine that reduces uric acid levels in your blood.  Changes to your diet. You may need to see a dietitian about what to eat and drink to prevent gout. Follow these instructions at home: During a gout attack  If directed, put ice on the affected area: ? Put ice in a plastic bag. ? Place a towel between your skin and the bag. ? Leave the ice on for 20 minutes, 2-3 times a  day.  Raise (elevate) the affected joint above the level of your heart as often as possible.  Rest the joint as much as possible. If the affected joint is in your leg, you may be given crutches to use.  Follow instructions from your health care provider about eating or drinking restrictions.   Avoiding future gout attacks  Follow a low-purine diet as told by your dietitian or health care provider. Avoid foods and drinks that are high in purines, including liver, kidney, anchovies, asparagus, herring, mushrooms, mussels, and beer.  Maintain a healthy weight or lose weight if you are overweight. If you want to lose weight, talk with your health care provider. It is important that you do not lose weight too quickly.  Start or maintain an exercise program as told by your health care provider. Eating and drinking  Drink enough fluids to keep your urine pale yellow.  If you drink alcohol: ? Limit how much you use to:  0-1 drink a day for women.  0-2 drinks a day for men. ? Be aware of how much alcohol is in your drink. In the [place] one drink equals one 12 oz bottle of beer (355 mL) one 5 oz glass of wine (148 mL), or one 1 oz glass of hard liquor (44 mL). General instructions  Take over-the-counter and prescription medicines only as told by your health care provider.  Do not drive or use heavy machinery while taking prescription pain medicine.  Return to your normal activities as told by your health care provider. Ask your health care provider what activities are safe for you.  Keep all follow-up visits as told by your health care provider. This is important. Contact a health care provider if you have:  Another gout attack.  Continuing symptoms of a gout attack after 10 days of treatment.  Side effects from your medicines.  Chills or a fever.  Burning pain when you urinate.  Pain in your lower back or belly. Get help right away if you:  Have severe or uncontrolled  pain.  Cannot urinate. Summary  Gout is painful swelling of the joints caused by inflammation.  The most common site of pain is the big toe, but it can affect other joints in the body.  Medicines and dietary changes can help to prevent and treat gout attacks. This information is not intended to replace advice given to you by your health care provider. Make sure you discuss any questions you have with your health care provider. Document Revised: 09/25/2017 Document Reviewed: 09/25/2017 Elsevier Patient Education  [employer].

## 2020-03-29 NOTE — Progress Notes (Signed)
Virtual Visit via Video Note The purpose of this virtual visit is to provide medical care while limiting exposure to the novel coronavirus.    Consent was obtained for video visit:  Yes.   Answered questions that patient had about telehealth interaction:  Yes.   I discussed the limitations, risks, security and privacy concerns of performing an evaluation and management service by telemedicine. I also discussed with the patient that there may be a patient responsible charge related to this service. The patient expressed understanding and agreed to proceed.  Pt location: Home Physician Location: office Name of referring provider:  Trifan, Carola Rhine, MD I connected with Ruth Bell at patients initiation/request on 03/29/2020 at  4:00 PM EST by video enabled telemedicine application and verified that I am speaking with the correct person using two identifiers. Pt MRN:  [id number] Pt DOB:  07/14/1960 Video Participants:  Ruth Bell; Elray Buba   History of Present Illness:  The patient had a virtual video visit on 03/29/2020. She was last seen in the neurology clinic a year ago for psychogenic non-epileptic events (PNES). She has had 3 EEGs from 2011 to 2017 where shaking episodes were captured with no EEG correlate. She presented for evaluation in January 2021 back on Keppra and had another EEG in our office at that time with an episode of shaking and unresponsiveness with no EEG correlate. She was lost to follow-up and had been continued on Keppra. She states she had been event-free until 03/25/2020, she woke up that morning and felt a lot of pressure in her chest. Symptoms worsened while she was at work, she recalls sitting in the laundry room then waking up to EMS around her. While she was in the ER, she had another generalized shaking episode, moving arms and legs rhythmically for 1-2 minutes. She opened her eyes and tracked after but appeared confused. She was given IV Ativan. Diagnosis of  PNES was discussed with patient and wife in the ER and Keppra was discontinued. She states she had been taking it regularly up until then. Her husband is pretty adamant that these shaking episodes were "definitely caused by stress and pain, nothing else."    History on Initial Assessment 03/23/2019: This is a 60 year old right-handed woman with a history of hypertension, hyperlipidemia, anxiety, depression, migraines, psychogenic non-epileptic events, presenting for evaluation of seizures and headaches. She denies any prior history of migraines however records from her prior neurologist Dr. Sabra Heck indicate a history of headaches. She states headaches started 6 months ago with pain in the vertex and lateralizing to the left side and left eye. If she pulls her hair in the middle, it takes pressure/tension off. Headaches occur once a week, with nausea/vomiting, sensitivity to lights and sounds. Ibuprofen has not helped. She reports seizures started at age 60, she used to have them daily, sometimes 3-4 times a day. She reports her heart stopped due to a seizure at one point. Records from Dr. Sabra Heck indicate that she had an EEG in 2011 with provocation-induced body and head jerking without EEG correlate. In 2012, episodes of staring and slumping down followed by generalized shaking and gagging were reported and felt to be psychogenic in the ER. In 2014, she was taking Dilantin and had an EEG capturing shaking with no EEG change. She was switched to [place] in 2016. There is an EEG in 2017 with note of mild slowing in the left temporal and left frontal region. "Seizure cream" was used  and she started feeling scared then had generalized shaking that at times could be stopped with the use of the "anti-seizure cream." At that time she was reporting right arm then head jerking. She had been event-free for 4 years and was off [place], until she had a fall in September 2020 and fractured her left wrist. She states seizures  started after this. Her husband describes them as "pain-induced seizures." He states the seizures look like her prior seizures where her whole body starts jerking then she passes out. No nocturnal seizures. She states the seizures are different now, not as hard as prior seizures, starting as a nagging headache in the middle of her forehead, she can see her hand jumping and knows it is about to happen, then loses consciousness. She would be unaware of her surroundings. No tongue bite or incontinence. She has fallen to the ground. She reports Keppra 500mg  BID was restarted 2 weeks ago and she has not had any seizures in 3 weeks. No side effects. Due to the pain, she has not been sleeping well, awake at 3 or 4AM due to left arm pain. In the past she took Quetiapine for sleep prescribed by Psychiatry, but has been off this for a while now. She has had some dizziness the past 3 days, yesterday she felt like she was falling to one side. She lives with her husband and daughter and states there is no stress in their house. She does not drive. She has had left arm numbness and tingling since the fall, with pain in the middle of her back. She does not drive. There is no prior history of physical or emotional trauma. Her sister has a history of seizures. She had a normal birth and early development, no history of febrile seizures, CNS infections, significant head injuries, neurosurgical procedures.   Diagnostic Data: MRI brain without contrast in July 2017 was normal. There have been several EEGs as noted above with shaking episodes captured with no EEG correlate, consistent with psychogenic events.    Current Outpatient Medications on File Prior to Visit  Medication Sig Dispense Refill  . amLODipine (NORVASC) 5 MG tablet Take 1 tablet (5 mg total) by mouth daily. 90 tablet 0  . Aspirin-Caffeine (BAYER BACK & BODY) 500-32.5 MG TABS Take 1 tablet by mouth 2 (two) times daily as needed (pain).    Marland Kitchen atorvastatin  (LIPITOR) 10 MG tablet TAKE 1 TABLET BY MOUTH  DAILY 90 tablet 1  . Butalbital-Acetaminophen 50-300 MG TABS Take 1 tablet by mouth 2 (two) times daily as needed. 60 tablet 1  . colchicine 0.6 MG tablet Take 2 tabs by mouth now, and repeat in 1 hour.  If symptoms are not improved by tomorrow- may repeat same dosing tomorrow 6 tablet 0  . cyclobenzaprine (FLEXERIL) 10 MG tablet Take 1 tablet (10 mg total) by mouth at bedtime. Please dc the 90 day rx with one refill. Only give 30 tabs. 30 tablet 0  . gabapentin (NEURONTIN) 300 MG capsule Take 300 mg by mouth 3 (three) times daily.    Marland Kitchen ibuprofen (ADVIL) 600 MG tablet Take 1 tablet (600 mg total) by mouth every 6 (six) hours as needed for up to 30 doses for mild pain or moderate pain. 30 tablet 0  . pantoprazole (PROTONIX) 40 MG tablet Take 1 tablet (40 mg total) by mouth daily. 90 tablet 0  . tiZANidine (ZANAFLEX) 4 MG tablet Take 1 tablet (4 mg total) by mouth every 6 (six) hours  as needed. 60 tablet 1  . famciclovir (FAMVIR) 500 MG tablet Take 1 tablet (500 mg total) by mouth 3 (three) times daily. (Patient not taking: Reported on 03/29/2020) 21 tablet 0   No current facility-administered medications on file prior to visit.     Observations/Objective:   Vitals:   03/29/20 1426  Weight: 105 lb (47.6 kg)  Height: 5\' 5"  (1.651 m)   GEN:  The patient appears stated age and is in NAD.  Neurological examination: Patient is awake, alert. No aphasia or dysarthria. Intact fluency and comprehension.Cranial nerves: Extraocular movements intact with no nystagmus. No facial asymmetry. Motor: moves all extremities symmetrically, at least anti-gravity x 4.    Assessment and Plan:   This is a 60 yo RH woman with a history of hypertension, hyperlipidemia, anxiety, depression, migraines, and psychogenic non-epileptic events captured on several EEG studies since 2011. She presented for evaluation in January 2021 back on Keppra, repeat EEG done Jan 2021 again  captured another psychogenic non-epileptic event with shaking and unresponsiveness. She was lost to follow-up and had continued Keppra, until she was back in the ER on 03/25/2020 after another episode at work, then in the ER. Findings and diagnosis of PNES discussed at length with patient and spouse today. Her husband states that these episodes are definitely related to stress and pain. We discussed doing inpatient EMU admission if questions remain about her diagnosis, as this is the gold standard for diagnosis. He does not think this is necessary. She has stopped the Keppra, which I agree with. If episodes continue despite improvement in pain/stress management, we can consider EMU monitoring. She does not drive. Follow-up as needed, they know to call for any changes.    Follow Up Instructions:   -I discussed the assessment and treatment plan with the patient. The patient was provided an opportunity to ask questions and all were answered. The patient agreed with the plan and demonstrated an understanding of the instructions.   The patient was advised to call back or seek an in-person evaluation if the symptoms worsen or if the condition fails to improve as anticipated.     Cameron Sprang, MD

## 2020-03-29 NOTE — Progress Notes (Signed)
   Covid-19 Vaccination Clinic  Name:  Ruth Bell    MRN: [id number] DOB: Nov 02, 1960  03/29/2020  [name] was observed post Covid-19 immunization for 15 minutes without incident. She was provided with Vaccine Information Sheet and instruction to access the V-Safe system.   [name] was instructed to call 911 with any severe reactions post vaccine: Marland Kitchen Difficulty breathing  . Swelling of face and throat  . A fast heartbeat  . A bad rash all over body  . Dizziness and weakness   Immunizations Administered    Name Date Dose VIS Date Route   Moderna Covid-19 Booster Vaccine 03/29/2020 10:29 AM 0.25 mL 01/06/2020 Intramuscular   Manufacturer: Levan Hurst   Lot: [id number]   [place]: [id number]

## 2020-03-29 NOTE — Progress Notes (Signed)
Subjective:    Patient ID: Ruth Bell, female    DOB: Feb 23, 1961, 60 y.o.   MRN: [id number]  HPI  Patient is a 60 yr old female who presents today with chief complaint of toe pain and swelling of the left foot. Notes pain in the left ankle as well. Denies any known injury.  No alleviating factors. Pain is worsened by walking.   Notes that she did not take her BP medication today.   Review of Systems See HPI  Past Medical History:  Diagnosis Date  . Anemia   . Anxiety   . Arm skin lesion, left 09/05/2014  . Constipation 12/27/2013  . Depression   . Dysphagia 04/15/2016  . Eczema 08/22/2015  . GERD (gastroesophageal reflux disease)   . Hematuria 03/04/2016  . History of shingles 01/22/2016  . Hyperglycemia 01/15/2015  . Hyperlipidemia, mixed 08/22/2015  . Hypertension   . Menometrorrhagia 2006  . Migraine, unspecified, without mention of intractable migraine without mention of status migrainosus   . Preventative health care 11/04/2016  . Pseudoseizures ([place])   . Seizures ([place])    history of pseudoseizure disorder, last last seizure 3 wks, keppra inc in dosage  . Tubular adenoma of colon 05/2011  . Uterine leiomyoma 2006     Social History   Socioeconomic History  . Marital status: Married    Spouse name: Gwyndolyn Saxon  . Number of children: 2  . Years of education: Not on file  . Highest education level: Not on file  Occupational History  . Occupation: CMA-retired  Tobacco Use  . Smoking status: Light Tobacco Smoker    Packs/day: 0.50    Years: 40.00    Pack years: 20.00    Types: Cigarettes    Start date: 12/18/2015  . Smokeless tobacco: Never Used  . Tobacco comment: 3-4 cigarettes daily  Vaping Use  . Vaping Use: Never used  Substance and Sexual Activity  . Alcohol use: Yes  . Drug use: No  . Sexual activity: Not on file    Comment: lives with husband, no dietary restrictions.   Other Topics Concern  . Not on file  Social History Narrative   Lives with her  husband and their son.   Daughter lives in [place], [place].   Social Determinants of Health   Financial Resource Strain: Not on file  Food Insecurity: Not on file  Transportation Needs: Not on file  Physical Activity: Not on file  Stress: Not on file  Social Connections: Not on file  Intimate Partner Violence: Not on file    Past Surgical History:  Procedure Laterality Date  . ABDOMINAL HYSTERECTOMY  04/06/04  . APPENDECTOMY    . BILATERAL SALPINGOOPHORECTOMY  04/06/04  . CARDIAC EVENT MONITOR  02/2016   Mostly sinus bradycardia and sinus rhythm.  Rare PVCs and PACs.  No arrhythmias.  Symptoms of chest pain and fluttering as well as "passed out spell" noted with normal sinus rhythm.  Marland Kitchen MASS EXCISION Left 06/02/2015   Procedure: EXCISION LEFT ARM MASS;  Surgeon: Autumn Messing III, MD;  Location: [facility];  Service: General;  Laterality: Left;  . NM MYOVIEW LTD  04/2016   Reached heart rate of 127 bpm with Lexiscan.  EF 60 to 65%.  LOW RISK.  No ischemia or infarction.  Marland Kitchen ROTATOR CUFF REPAIR    . TRANSTHORACIC ECHOCARDIOGRAM  01/2016   F 55-6%.  No R WMA.  GR 1 DD.  Normal valves.    Family  History  Problem Relation Age of Onset  . Heart disease Mother   . Heart attack Mother   . Cancer Father        colon  . Hypertension Father   . Cancer Sister        brain  . Cancer Brother   . Hypertension Sister   . Cancer Sister        unsure of type  . Diabetes Sister        type 2  . Heart attack Sister   . Hypertension Brother   . Colon cancer Neg Hx     No Known Allergies  Current Outpatient Medications on File Prior to Visit  Medication Sig Dispense Refill  . amLODipine (NORVASC) 5 MG tablet Take 1 tablet (5 mg total) by mouth daily. 90 tablet 0  . Aspirin-Caffeine (BAYER BACK & BODY) 500-32.5 MG TABS Take 1 tablet by mouth 2 (two) times daily as needed (pain).    Marland Kitchen atorvastatin (LIPITOR) 10 MG tablet TAKE 1 TABLET BY MOUTH  DAILY 90 tablet 1  .  Butalbital-Acetaminophen 50-300 MG TABS Take 1 tablet by mouth 2 (two) times daily as needed. 60 tablet 1  . famciclovir (FAMVIR) 500 MG tablet Take 1 tablet (500 mg total) by mouth 3 (three) times daily. 21 tablet 0  . levETIRAcetam (KEPPRA) 500 MG tablet TAKE 1 TABLET (500 MG TOTAL) BY MOUTH TWO (2) TIMES A DAY. 180 tablet 3  . predniSONE (DELTASONE) 10 MG tablet Take 4 tablets (40 mg total) by mouth daily for 4 days. 16 tablet 0  . cyclobenzaprine (FLEXERIL) 10 MG tablet Take 1 tablet (10 mg total) by mouth at bedtime. Please dc the 90 day rx with one refill. Only give 30 tabs. (Patient not taking: Reported on 03/29/2020) 30 tablet 0  . gabapentin (NEURONTIN) 300 MG capsule Take 300 mg by mouth 3 (three) times daily. (Patient not taking: Reported on 03/29/2020)    . ibuprofen (ADVIL) 600 MG tablet Take 1 tablet (600 mg total) by mouth every 6 (six) hours as needed for up to 30 doses for mild pain or moderate pain. (Patient not taking: Reported on 03/29/2020) 30 tablet 0  . pantoprazole (PROTONIX) 40 MG tablet Take 1 tablet (40 mg total) by mouth daily. (Patient not taking: Reported on 03/29/2020) 90 tablet 0  . tiZANidine (ZANAFLEX) 4 MG tablet Take 1 tablet (4 mg total) by mouth every 6 (six) hours as needed. (Patient not taking: No sig reported) 60 tablet 1   No current facility-administered medications on file prior to visit.    BP (!) 181/84 (BP Location: Right Arm, Patient Position: Sitting, Cuff Size: Normal)   Pulse 61   Temp 98.4 F (36.9 C) (Oral)   Resp 18   Wt 102 lb 12.8 oz (46.6 kg)   LMP  (LMP Unknown)   SpO2 100%   BMI 18.80 kg/m       Objective:   Physical Exam Constitutional:      Comments: Appears uncomfortable due to pain  Cardiovascular:     Pulses:          Dorsalis pedis pulses are 2+ on the right side and 2+ on the left side.       Posterior tibial pulses are 2+ on the right side and 2+ on the left side.  Musculoskeletal:     Comments: + tenderness to  palpation of the left ankle.  + swelling and tenderness to palpation of all toes left foot  except great toe  Skin:    Comments: Mild hyperpigmentation noted bilateral knuckles  Neurological:     General: No focal deficit present.     Mental Status: She is alert and oriented to person, place, and time.           Assessment & Plan:  Gout- hx and presentation most consistent with gout. Will rx with colchicine 0.6mg  2 tabs now and 1 tab in 1 hour. May repeat tomorrow if needed. She is advised that she may have diarrhea following colchicine dosing. Will check uric acid level and x-ray.  Hyperpigmentation of skin- advised pt that this is unlikely worrisome, but she is very concerned about this. Will refer to dermatology.   HTN- uncontrolled- pt did not take her amlodipine today and is in pain. Advised the patient to take her medication as soon as she gets home.   This visit occurred during the SARS-CoV-2 public health emergency.  Safety protocols were in place, including screening questions prior to the visit, additional usage of staff PPE, and extensive cleaning of exam room while observing appropriate contact time as indicated for disinfecting solutions.

## 2020-03-30 ENCOUNTER — Telehealth: Payer: Self-pay | Admitting: Family

## 2020-03-30 DIAGNOSIS — M79672 Pain in left foot: Secondary | ICD-10-CM

## 2020-03-30 NOTE — Telephone Encounter (Signed)
Please contact pt and let her know that her gout test looks OK as well as her x-ray (just shows arthritis).  Is she feeling any better after taking the colchicine?  If not, then I would like for her to return to the lab for some additional lab work (see orders) and schedule a follow up appointment with Dr. Charlett Blake.

## 2020-03-31 ENCOUNTER — Other Ambulatory Visit: Payer: Self-pay

## 2020-03-31 NOTE — Telephone Encounter (Signed)
Patient advised of results and provider's comments. She was scheduled to come in for labs tomorrow.

## 2020-04-01 ENCOUNTER — Other Ambulatory Visit (INDEPENDENT_AMBULATORY_CARE_PROVIDER_SITE_OTHER): Payer: 59

## 2020-04-01 DIAGNOSIS — M79672 Pain in left foot: Secondary | ICD-10-CM | POA: Diagnosis not present

## 2020-04-01 LAB — SEDIMENTATION RATE: Sed Rate: 16 mm/hr (ref 0–30)

## 2020-04-01 LAB — CBC WITH DIFFERENTIAL/PLATELET
Basophils Absolute: 0.1 10*3/uL (ref 0.0–0.1)
Basophils Relative: 0.8 % (ref 0.0–3.0)
Eosinophils Absolute: 0.3 10*3/uL (ref 0.0–0.7)
Eosinophils Relative: 4 % (ref 0.0–5.0)
HCT: 41.9 % (ref 36.0–46.0)
Hemoglobin: 13.6 g/dL (ref 12.0–15.0)
Lymphocytes Relative: 30.5 % (ref 12.0–46.0)
Lymphs Abs: 2.6 10*3/uL (ref 0.7–4.0)
MCHC: 32.5 g/dL (ref 30.0–36.0)
MCV: 84.6 fl (ref 78.0–100.0)
Monocytes Absolute: 0.6 10*3/uL (ref 0.1–1.0)
Monocytes Relative: 7.4 % (ref 3.0–12.0)
Neutro Abs: 4.8 10*3/uL (ref 1.4–7.7)
Neutrophils Relative %: 57.3 % (ref 43.0–77.0)
Platelets: 286 10*3/uL (ref 150.0–400.0)
RBC: 4.95 Mil/uL (ref 3.87–5.11)
RDW: 14.3 % (ref 11.5–15.5)
WBC: 8.4 10*3/uL (ref 4.0–10.5)

## 2020-04-04 ENCOUNTER — Telehealth: Payer: Self-pay | Admitting: Family

## 2020-04-04 LAB — RHEUMATOID FACTOR: Rheumatoid fact SerPl-aCnc: <14 IU/mL (ref <14)

## 2020-04-04 LAB — ANA: Anti Nuclear Antibody (ANA): NEGATIVE

## 2020-04-04 NOTE — Telephone Encounter (Signed)
Notified pt and she voices understanding. 

## 2020-04-04 NOTE — Telephone Encounter (Signed)
Please advise pt that her autoimmune blood testing is normal. I would like her to keep her follow up appointment with Dr. Charlett Blake on 1/20.

## 2020-04-07 ENCOUNTER — Telehealth (INDEPENDENT_AMBULATORY_CARE_PROVIDER_SITE_OTHER): Payer: 59 | Admitting: Family Medicine

## 2020-04-07 ENCOUNTER — Other Ambulatory Visit: Payer: Self-pay

## 2020-04-07 VITALS — BP 115/74 | HR 91 | Ht 65.0 in | Wt 110.0 lb

## 2020-04-07 DIAGNOSIS — I10 Essential (primary) hypertension: Secondary | ICD-10-CM

## 2020-04-07 DIAGNOSIS — M542 Cervicalgia: Secondary | ICD-10-CM

## 2020-04-07 DIAGNOSIS — E782 Mixed hyperlipidemia: Secondary | ICD-10-CM

## 2020-04-07 DIAGNOSIS — F32A Depression, unspecified: Secondary | ICD-10-CM

## 2020-04-07 DIAGNOSIS — F419 Anxiety disorder, unspecified: Secondary | ICD-10-CM

## 2020-04-07 DIAGNOSIS — G43909 Migraine, unspecified, not intractable, without status migrainosus: Secondary | ICD-10-CM

## 2020-04-07 MED ORDER — METHYLPREDNISOLONE 4 MG PO TABS: ORAL_TABLET | ORAL | 0 refills | Status: DC

## 2020-04-07 MED ORDER — TIZANIDINE HCL 2 MG PO TABS: 1.0000 mg | ORAL_TABLET | Freq: Three times a day (TID) | ORAL | 2 refills | Status: DC | PRN

## 2020-04-10 NOTE — Assessment & Plan Note (Signed)
Tolerating statin, encouraged heart healthy diet, avoid trans fats, minimize simple carbs and saturated fats. Increase exercise as tolerated 

## 2020-04-10 NOTE — Assessment & Plan Note (Signed)
Check xray. given Medrol dosepak and Tizanidine. Encouraged moist heat and gentle stretching as tolerated. May try NSAIDs and prescription meds as directed and report if symptoms worsen or seek immediate care.

## 2020-04-10 NOTE — Progress Notes (Signed)
Virtual Visit via phone Note  I connected with Ruth Bell on 04/07/20 at  3:20 PM EST by a phone enabled telemedicine application and verified that I am speaking with the correct person using two identifiers.  Location: Patient: home, patient and provider in visit Provider: office   I discussed the limitations of evaluation and management by telemedicine and the availability of in person appointments. The patient expressed understanding and agreed to proceed. CMA was able to get the patient set up on a phone visit after being unable to set up a video visit   Subjective:    Patient ID: Ruth Bell, female    DOB: 1960/08/31, 60 y.o.   MRN: [id number]  Chief Complaint  Patient presents with  . Follow-up  . Headache    Radiating to back of neck ongoing for 2-3 weeks is causing her to have seizures and black out    HPI Patient is in today for follow up on chronic medical concerns. No recent febrile illness or hospitalizations. She is struggling with increased neck pain and headaches. She is following with neurology but notes her neck pain contributes to her headaches. She notes feeling dizzy with quick movements recently. No injury noted. Very stressed about her pain. Denies CP/palp/SOB/congestion/fevers/GI or GU c/o. Taking meds as prescribed  Past Medical History:  Diagnosis Date  . Anemia   . Anxiety   . Arm skin lesion, left 09/05/2014  . Constipation 12/27/2013  . Depression   . Dysphagia 04/15/2016  . Eczema 08/22/2015  . GERD (gastroesophageal reflux disease)   . Hematuria 03/04/2016  . History of shingles 01/22/2016  . Hyperglycemia 01/15/2015  . Hyperlipidemia, mixed 08/22/2015  . Hypertension   . Menometrorrhagia 2006  . Migraine, unspecified, without mention of intractable migraine without mention of status migrainosus   . Preventative health care 11/04/2016  . Pseudoseizures ([facility])   . Seizures ([facility])    history of pseudoseizure disorder, last last seizure 3 wks, keppra  inc in dosage  . Tubular adenoma of colon 05/2011  . Uterine leiomyoma 2006    Past Surgical History:  Procedure Laterality Date  . ABDOMINAL HYSTERECTOMY  04/06/04  . APPENDECTOMY    . BILATERAL SALPINGOOPHORECTOMY  04/06/04  . CARDIAC EVENT MONITOR  02/2016   Mostly sinus bradycardia and sinus rhythm.  Rare PVCs and PACs.  No arrhythmias.  Symptoms of chest pain and fluttering as well as "passed out spell" noted with normal sinus rhythm.  Marland Kitchen MASS EXCISION Left 06/02/2015   Procedure: EXCISION LEFT ARM MASS;  Surgeon: Autumn Messing III, MD;  Location: [facility];  Service: General;  Laterality: Left;  . NM MYOVIEW LTD  04/2016   Reached heart rate of 127 bpm with Lexiscan.  EF 60 to 65%.  LOW RISK.  No ischemia or infarction.  Marland Kitchen ROTATOR CUFF REPAIR    . TRANSTHORACIC ECHOCARDIOGRAM  01/2016   F 55-6%.  No R WMA.  GR 1 DD.  Normal valves.    Family History  Problem Relation Age of Onset  . Heart disease Mother   . Heart attack Mother   . Cancer Father        colon  . Hypertension Father   . Cancer Sister        brain  . Cancer Brother   . Hypertension Sister   . Cancer Sister        unsure of type  . Diabetes Sister        type  2  . Heart attack Sister   . Hypertension Brother   . Colon cancer Neg Hx     Social History   Socioeconomic History  . Marital status: Married    Spouse name: Gwyndolyn Saxon  . Number of children: 2  . Years of education: Not on file  . Highest education level: Not on file  Occupational History  . Occupation: CMA-retired  Tobacco Use  . Smoking status: Light Tobacco Smoker    Packs/day: 0.50    Years: 40.00    Pack years: 20.00    Types: Cigarettes    Start date: 12/18/2015  . Smokeless tobacco: Never Used  . Tobacco comment: 3-4 cigarettes daily  Vaping Use  . Vaping Use: Never used  Substance and Sexual Activity  . Alcohol use: Yes    Comment: wine occassioanlly   . Drug use: No  . Sexual activity: Not on file    Comment:  lives with husband, no dietary restrictions.   Other Topics Concern  . Not on file  Social History Narrative   Lives with her husband and their son.   Daughter lives in [place], [place].   Social Determinants of Health   Financial Resource Strain: Not on file  Food Insecurity: Not on file  Transportation Needs: Not on file  Physical Activity: Not on file  Stress: Not on file  Social Connections: Not on file  Intimate Partner Violence: Not on file    Outpatient Medications Prior to Visit  Medication Sig Dispense Refill  . amLODipine (NORVASC) 5 MG tablet Take 1 tablet (5 mg total) by mouth daily. 90 tablet 0  . Aspirin-Caffeine (BAYER BACK & BODY) 500-32.5 MG TABS Take 1 tablet by mouth 2 (two) times daily as needed (pain).    Marland Kitchen atorvastatin (LIPITOR) 10 MG tablet TAKE 1 TABLET BY MOUTH  DAILY 90 tablet 1  . Butalbital-Acetaminophen 50-300 MG TABS Take 1 tablet by mouth 2 (two) times daily as needed. 60 tablet 1  . colchicine 0.6 MG tablet Take 2 tabs by mouth now, and repeat in 1 hour.  If symptoms are not improved by tomorrow- may repeat same dosing tomorrow 6 tablet 0  . cyclobenzaprine (FLEXERIL) 10 MG tablet Take 1 tablet (10 mg total) by mouth at bedtime. Please dc the 90 day rx with one refill. Only give 30 tabs. 30 tablet 0  . famciclovir (FAMVIR) 500 MG tablet Take 1 tablet (500 mg total) by mouth 3 (three) times daily. 21 tablet 0  . gabapentin (NEURONTIN) 300 MG capsule Take 300 mg by mouth 3 (three) times daily.    Marland Kitchen ibuprofen (ADVIL) 600 MG tablet Take 1 tablet (600 mg total) by mouth every 6 (six) hours as needed for up to 30 doses for mild pain or moderate pain. 30 tablet 0  . pantoprazole (PROTONIX) 40 MG tablet Take 1 tablet (40 mg total) by mouth daily. 90 tablet 0  . tiZANidine (ZANAFLEX) 4 MG tablet Take 1 tablet (4 mg total) by mouth every 6 (six) hours as needed. 60 tablet 1   No facility-administered medications prior to visit.    No Known Allergies  Review of  Systems  Constitutional: Positive for malaise/fatigue. Negative for fever.  HENT: Negative for congestion.   Eyes: Negative for blurred vision.  Respiratory: Negative for shortness of breath.   Cardiovascular: Negative for chest pain, palpitations and leg swelling.  Gastrointestinal: Negative for abdominal pain, blood in stool and nausea.  Genitourinary: Negative for dysuria and frequency.  Musculoskeletal: Positive for neck pain. Negative for falls.  Skin: Negative for rash.  Neurological: Positive for dizziness and headaches. Negative for loss of consciousness.  Endo/Heme/Allergies: Negative for environmental allergies.  Psychiatric/Behavioral: Positive for depression. The patient is nervous/anxious.        Objective:    Physical Exam unable to obtain via phone  BP 115/74 (BP Location: Right Arm, Patient Position: Sitting, Cuff Size: Large)   Pulse 91   Ht 5\' 5"  (1.651 m)   Wt 110 lb (49.9 kg)   LMP  (LMP Unknown)   BMI 18.30 kg/m  Wt Readings from Last 3 Encounters:  04/07/20 110 lb (49.9 kg)  03/29/20 105 lb (47.6 kg)  03/29/20 102 lb 12.8 oz (46.6 kg)    Diabetic Foot Exam - Simple   No data filed    Lab Results  Component Value Date   WBC 8.4 04/01/2020   HGB 13.6 04/01/2020   HCT 41.9 04/01/2020   PLT 286.0 04/01/2020   GLUCOSE 98 03/25/2020   CHOL 220 (H) 12/21/2019   TRIG 86 12/21/2019   HDL 68 12/21/2019   LDLCALC 133 (H) 12/21/2019   ALT 12 12/21/2019   AST 16 12/21/2019   NA 140 03/25/2020   K 3.8 03/25/2020   CL 106 03/25/2020   CREATININE 0.63 03/25/2020   BUN 7 (L) 03/25/2020   CO2 24 03/25/2020   TSH 0.77 02/05/2019   INR 1.0 11/19/2013   HGBA1C 6.5 02/05/2019    Lab Results  Component Value Date   TSH 0.77 02/05/2019   Lab Results  Component Value Date   WBC 8.4 04/01/2020   HGB 13.6 04/01/2020   HCT 41.9 04/01/2020   MCV 84.6 04/01/2020   PLT 286.0 04/01/2020   Lab Results  Component Value Date   NA 140 03/25/2020   K 3.8  03/25/2020   CO2 24 03/25/2020   GLUCOSE 98 03/25/2020   BUN 7 (L) 03/25/2020   CREATININE 0.63 03/25/2020   BILITOT 0.9 12/21/2019   ALKPHOS 63 02/05/2019   AST 16 12/21/2019   ALT 12 12/21/2019   PROT 7.7 12/21/2019   ALBUMIN 4.7 02/05/2019   CALCIUM 9.2 03/25/2020   ANIONGAP 10 03/25/2020   GFR 145.36 02/05/2019   Lab Results  Component Value Date   CHOL 220 (H) 12/21/2019   Lab Results  Component Value Date   HDL 68 12/21/2019   Lab Results  Component Value Date   LDLCALC 133 (H) 12/21/2019   Lab Results  Component Value Date   TRIG 86 12/21/2019   Lab Results  Component Value Date   CHOLHDL 3.2 12/21/2019   Lab Results  Component Value Date   HGBA1C 6.5 02/05/2019       Assessment & Plan:   Problem List Items Addressed This Visit    Essential hypertension (Chronic)    Monitor and report any concerns, no changes to meds. Encouraged heart healthy diet such as the DASH diet and exercise as tolerated.       Anxiety and depression    Patient with increased anxiety due to her pain but she is not ready for meds.      Migraine    Encouraged increased hydration, 64 ounces of clear fluids daily. Minimize alcohol and caffeine. Eat small frequent meals with lean proteins and complex carbs. Avoid high and low blood sugars. Get adequate sleep, 7-8 hours a night. Needs exercise daily preferably in the morning. She is following with [facility]  Relevant Medications   tiZANidine (ZANAFLEX) 2 MG tablet   Neck pain - Primary    Check xray. given Medrol dosepak and Tizanidine. Encouraged moist heat and gentle stretching as tolerated. May try NSAIDs and prescription meds as directed and report if symptoms worsen or seek immediate care.      Relevant Orders   DG Cervical Spine Complete   Hyperlipidemia, mixed    Tolerating statin, encouraged heart healthy diet, avoid trans fats, minimize simple carbs and saturated fats. Increase exercise as tolerated          I have discontinued Oletta Cohn. Ladnier's tiZANidine. I am also having her start on tiZANidine and methylPREDNISolone. Additionally, I am having her maintain her cyclobenzaprine, atorvastatin, pantoprazole, Butalbital-Acetaminophen, gabapentin, famciclovir, amLODipine, [employer] Back [employer] Body, ibuprofen, and colchicine.  Meds ordered this encounter  Medications  . tiZANidine (ZANAFLEX) 2 MG tablet    Sig: Take 0.5-2 tablets (1-4 mg total) by mouth every 8 (eight) hours as needed for muscle spasms.    Dispense:  40 tablet    Refill:  2  . methylPREDNISolone (MEDROL) 4 MG tablet    Sig: 5 tab po qd X 1d then 4 tab po qd X 1d then 3 tab po qd X 1d then 2 tab po qd then 1 tab po qd    Dispense:  15 tablet    Refill:  0    I discussed the assessment and treatment plan with the patient. The patient was provided an opportunity to ask questions and all were answered. The patient agreed with the plan and demonstrated an understanding of the instructions.   The patient was advised to call back or seek an in-person evaluation if the symptoms worsen or if the condition fails to improve as anticipated.  I provided 25 minutes of non-face-to-face time during this encounter.   [name], MD

## 2020-04-10 NOTE — Assessment & Plan Note (Signed)
Monitor and report any concerns, no changes to meds. Encouraged heart healthy diet such as the DASH diet and exercise as tolerated.  ?

## 2020-04-10 NOTE — Assessment & Plan Note (Addendum)
Patient with increased anxiety due to her pain but she is not ready for meds.

## 2020-04-10 NOTE — Assessment & Plan Note (Addendum)
Encouraged increased hydration, 64 ounces of clear fluids daily. Minimize alcohol and caffeine. Eat small frequent meals with lean proteins and complex carbs. Avoid high and low blood sugars. Get adequate sleep, 7-8 hours a night. Needs exercise daily preferably in the morning. She is following with [facility]

## 2020-04-25 ENCOUNTER — Other Ambulatory Visit: Payer: Self-pay

## 2020-04-25 ENCOUNTER — Telehealth: Payer: Self-pay | Admitting: Family Medicine

## 2020-04-25 DIAGNOSIS — M542 Cervicalgia: Secondary | ICD-10-CM

## 2020-04-25 MED ORDER — IBUPROFEN 600 MG PO TABS: 600.0000 mg | ORAL_TABLET | Freq: Four times a day (QID) | ORAL | 0 refills | Status: DC | PRN

## 2020-04-25 NOTE — Telephone Encounter (Signed)
Sent in

## 2020-04-25 NOTE — Telephone Encounter (Signed)
Medication:ibuprofen (ADVIL) 600 MG tablet    Has the patient contacted their pharmacy? No. (If no, request that the patient contact the pharmacy for the refill.) (If yes, when and what did the pharmacy advise?)  Preferred Pharmacy (with phone number or street name):  [id number] [address]  [place], [place] [address]  Phone:  [phone] Fax:  [phone]  DEA #:  [id number]  [place] Reason: --     Agent: Please be advised that RX refills may take up to 3 business days. We ask that you follow-up with your pharmacy.

## 2020-05-10 ENCOUNTER — Ambulatory Visit: Payer: BLUE CROSS/BLUE SHIELD | Admitting: Neurology

## 2020-07-02 ENCOUNTER — Other Ambulatory Visit: Payer: Self-pay | Admitting: Family Medicine

## 2020-07-05 ENCOUNTER — Other Ambulatory Visit (HOSPITAL_COMMUNITY): Payer: Self-pay

## 2020-07-13 ENCOUNTER — Ambulatory Visit: Payer: 59 | Admitting: Physician Assistant

## 2020-07-17 ENCOUNTER — Other Ambulatory Visit: Payer: Self-pay | Admitting: Family Medicine

## 2020-08-03 ENCOUNTER — Other Ambulatory Visit: Payer: Self-pay

## 2020-08-03 ENCOUNTER — Ambulatory Visit (INDEPENDENT_AMBULATORY_CARE_PROVIDER_SITE_OTHER): Payer: 59 | Admitting: Medical

## 2020-08-03 ENCOUNTER — Other Ambulatory Visit (HOSPITAL_BASED_OUTPATIENT_CLINIC_OR_DEPARTMENT_OTHER): Payer: Self-pay

## 2020-08-03 VITALS — BP 140/80 | HR 95 | Temp 98.4°F | Resp 20 | Ht 65.0 in | Wt 105.6 lb

## 2020-08-03 DIAGNOSIS — R112 Nausea with vomiting, unspecified: Secondary | ICD-10-CM

## 2020-08-03 DIAGNOSIS — R1030 Lower abdominal pain, unspecified: Secondary | ICD-10-CM

## 2020-08-03 DIAGNOSIS — K635 Polyp of colon: Secondary | ICD-10-CM | POA: Diagnosis not present

## 2020-08-03 DIAGNOSIS — R109 Unspecified abdominal pain: Secondary | ICD-10-CM

## 2020-08-03 DIAGNOSIS — R319 Hematuria, unspecified: Secondary | ICD-10-CM

## 2020-08-03 LAB — COMPREHENSIVE METABOLIC PANEL
ALT: 17 U/L (ref 0–35)
AST: 22 U/L (ref 0–37)
Albumin: 4.7 g/dL (ref 3.5–5.2)
Alkaline Phosphatase: 62 U/L (ref 39–117)
BUN: 14 mg/dL (ref 6–23)
CO2: 30 mEq/L (ref 19–32)
Calcium: 10.5 mg/dL (ref 8.4–10.5)
Chloride: 102 mEq/L (ref 96–112)
Creatinine, Ser: 0.58 mg/dL (ref 0.40–1.20)
GFR: 97.37 mL/min (ref >60.00)
Glucose, Bld: 95 mg/dL (ref 70–99)
Potassium: 4.1 mEq/L (ref 3.5–5.1)
Sodium: 141 mEq/L (ref 135–145)
Total Bilirubin: 0.5 mg/dL (ref 0.2–1.2)
Total Protein: 8 g/dL (ref 6.0–8.3)

## 2020-08-03 LAB — POC URINALSYSI DIPSTICK (AUTOMATED)
Bilirubin, UA: NEGATIVE
Blood, UA: POSITIVE
Glucose, UA: NEGATIVE
Ketones, UA: NEGATIVE
Leukocytes, UA: NEGATIVE
Nitrite, UA: NEGATIVE
Protein, UA: POSITIVE — AB
Spec Grav, UA: 1.015 (ref 1.010–1.025)
Urobilinogen, UA: 0.2 E.U./dL
pH, UA: 7.5 (ref 5.0–8.0)

## 2020-08-03 LAB — CBC WITH DIFFERENTIAL/PLATELET
Basophils Absolute: 0.1 10*3/uL (ref 0.0–0.1)
Basophils Relative: 0.8 % (ref 0.0–3.0)
Eosinophils Absolute: 0.1 10*3/uL (ref 0.0–0.7)
Eosinophils Relative: 0.4 % (ref 0.0–5.0)
HCT: 42.1 % (ref 36.0–46.0)
Hemoglobin: 13.9 g/dL (ref 12.0–15.0)
Lymphocytes Relative: 25.1 % (ref 12.0–46.0)
Lymphs Abs: 2.9 10*3/uL (ref 0.7–4.0)
MCHC: 33.1 g/dL (ref 30.0–36.0)
MCV: 86 fl (ref 78.0–100.0)
Monocytes Absolute: 0.5 10*3/uL (ref 0.1–1.0)
Monocytes Relative: 4.5 % (ref 3.0–12.0)
Neutro Abs: 8.1 10*3/uL — ABNORMAL HIGH (ref 1.4–7.7)
Neutrophils Relative %: 69.2 % (ref 43.0–77.0)
Platelets: 289 10*3/uL (ref 150.0–400.0)
RBC: 4.9 Mil/uL (ref 3.87–5.11)
RDW: 14.1 % (ref 11.5–15.5)
WBC: 11.8 10*3/uL — ABNORMAL HIGH (ref 4.0–10.5)

## 2020-08-03 LAB — LIPASE: Lipase: 45 U/L (ref 11.0–59.0)

## 2020-08-03 MED ORDER — METRONIDAZOLE 500 MG PO TABS
500.0000 mg | ORAL_TABLET | Freq: Three times a day (TID) | ORAL | 0 refills | Status: AC
  Filled 2020-08-03: qty 21, 7d supply, fill #0

## 2020-08-03 MED ORDER — CIPROFLOXACIN HCL 500 MG PO TABS
500.0000 mg | ORAL_TABLET | Freq: Two times a day (BID) | ORAL | 0 refills | Status: DC
  Filled 2020-08-03: qty 14, 7d supply, fill #0

## 2020-08-03 MED ORDER — ONDANSETRON 4 MG PO TBDP
4.0000 mg | ORAL_TABLET | Freq: Three times a day (TID) | ORAL | 0 refills | Status: DC | PRN
  Filled 2020-08-03: qty 18, 6d supply, fill #0

## 2020-08-03 NOTE — Progress Notes (Signed)
Subjective:    Patient ID: Ruth Bell, female    DOB: 07-12-1960, 60 y.o.   MRN: [id number]  HPI  Pt states that over last 3 weeks she has  Abdomen pain when she eats. She points to suprapubic area. She states pain level similar to high level pain when about to menstruate. Pt states had hysterectomy when she was in 59 yr old or so.  Pt thinks has both ovaries.Pt has some pain in rt lower abdomen pain. Left side painful but less than rt. She feels bloated.   Pt states last 3 weeks will vomit whenever she eats. Pt has been drinking water and tea all day. Drinks 1 beer ever other day even when sick. Last night vomited up her beer.  Pt states vomitus looks clear. Not black or bloody.   Pt had rx protonix 2 years ago. Denies chronic reflux history.  Pt has htn. She is on amlodipine 5 mg daily.  Last bm last night and today. State on loose side. Pt denies constipation.  Pt has appendix removed in past. 2017 colonsocopy showed polyps but no diverticulitis. But has been 5 years approximate.   Review of Systems  Respiratory: Negative for cough, chest tightness and wheezing.   Cardiovascular: Negative for chest pain and palpitations.  Gastrointestinal: Positive for nausea and vomiting. Negative for abdominal pain and constipation.       Loose stools for 2 days.  Genitourinary: Negative for difficulty urinating, dysuria, genital sores and urgency.  Musculoskeletal: Negative for back pain.  Psychiatric/Behavioral: Negative for behavioral problems and decreased concentration.    Past Medical History:  Diagnosis Date  . Anemia   . Anxiety   . Arm skin lesion, left 09/05/2014  . Constipation 12/27/2013  . Depression   . Dysphagia 04/15/2016  . Eczema 08/22/2015  . GERD (gastroesophageal reflux disease)   . Hematuria 03/04/2016  . History of shingles 01/22/2016  . Hyperglycemia 01/15/2015  . Hyperlipidemia, mixed 08/22/2015  . Hypertension   . Menometrorrhagia 2006  . Migraine,  unspecified, without mention of intractable migraine without mention of status migrainosus   . Preventative health care 11/04/2016  . Pseudoseizures ([facility])   . Seizures ([facility])    history of pseudoseizure disorder, last last seizure 3 wks, keppra inc in dosage  . Tubular adenoma of colon 05/2011  . Uterine leiomyoma 2006     Social History   Socioeconomic History  . Marital status: Married    Spouse name: Gwyndolyn Saxon  . Number of children: 2  . Years of education: Not on file  . Highest education level: Not on file  Occupational History  . Occupation: CMA-retired  Tobacco Use  . Smoking status: Light Tobacco Smoker    Packs/day: 0.50    Years: 40.00    Pack years: 20.00    Types: Cigarettes    Start date: 12/18/2015  . Smokeless tobacco: Never Used  . Tobacco comment: 3-4 cigarettes daily  Vaping Use  . Vaping Use: Never used  Substance and Sexual Activity  . Alcohol use: Yes    Comment: wine occassioanlly   . Drug use: No  . Sexual activity: Not on file    Comment: lives with husband, no dietary restrictions.   Other Topics Concern  . Not on file  Social History Narrative   Lives with her husband and their son.   Daughter lives in [place], [place].   Social Determinants of Health   Financial Resource Strain: Not on file  Food Insecurity:  Not on file  Transportation Needs: Not on file  Physical Activity: Not on file  Stress: Not on file  Social Connections: Not on file  Intimate Partner Violence: Not on file    Past Surgical History:  Procedure Laterality Date  . ABDOMINAL HYSTERECTOMY  04/06/04  . APPENDECTOMY    . BILATERAL SALPINGOOPHORECTOMY  04/06/04  . CARDIAC EVENT MONITOR  02/2016   Mostly sinus bradycardia and sinus rhythm.  Rare PVCs and PACs.  No arrhythmias.  Symptoms of chest pain and fluttering as well as "passed out spell" noted with normal sinus rhythm.  Marland Kitchen MASS EXCISION Left 06/02/2015   Procedure: EXCISION LEFT ARM MASS;  Surgeon: Autumn Messing III, MD;   Location: [facility];  Service: General;  Laterality: Left;  . NM MYOVIEW LTD  04/2016   Reached heart rate of 127 bpm with Lexiscan.  EF 60 to 65%.  LOW RISK.  No ischemia or infarction.  Marland Kitchen ROTATOR CUFF REPAIR    . TRANSTHORACIC ECHOCARDIOGRAM  01/2016   F 55-6%.  No R WMA.  GR 1 DD.  Normal valves.    Family History  Problem Relation Age of Onset  . Heart disease Mother   . Heart attack Mother   . Cancer Father        colon  . Hypertension Father   . Cancer Sister        brain  . Cancer Brother   . Hypertension Sister   . Cancer Sister        unsure of type  . Diabetes Sister        type 2  . Heart attack Sister   . Hypertension Brother   . Colon cancer Neg Hx     No Known Allergies  Current Outpatient Medications on File Prior to Visit  Medication Sig Dispense Refill  . amLODipine (NORVASC) 5 MG tablet Take 1 tablet (5 mg total) by mouth daily. 90 tablet 0  . Aspirin-Caffeine (BAYER BACK & BODY) 500-32.5 MG TABS Take 1 tablet by mouth 2 (two) times daily as needed (pain).    Marland Kitchen atorvastatin (LIPITOR) 10 MG tablet TAKE 1 TABLET BY MOUTH  DAILY 90 tablet 1  . Butalbital-Acetaminophen 50-300 MG TABS Take 1 tablet by mouth 2 (two) times daily as needed. 60 tablet 1  . colchicine 0.6 MG tablet Take 2 tabs by mouth now, and repeat in 1 hour.  If symptoms are not improved by tomorrow- may repeat same dosing tomorrow 6 tablet 0  . COVID-19 mRNA vaccine, Moderna, 100 MCG/0.5ML injection INJECT AS DIRECTED .25 mL 0  . cyclobenzaprine (FLEXERIL) 10 MG tablet Take 1 tablet (10 mg total) by mouth at bedtime. Please dc the 90 day rx with one refill. Only give 30 tabs. 30 tablet 0  . famciclovir (FAMVIR) 500 MG tablet Take 1 tablet (500 mg total) by mouth 3 (three) times daily. 21 tablet 0  . gabapentin (NEURONTIN) 300 MG capsule Take 300 mg by mouth 3 (three) times daily.    Marland Kitchen ibuprofen (ADVIL) 600 MG tablet Take 1 tablet (600 mg total) by mouth every 6 (six) hours as  needed for up to 30 doses for mild pain or moderate pain. 30 tablet 0  . methylPREDNISolone (MEDROL) 4 MG tablet 5 tab po qd X 1d then 4 tab po qd X 1d then 3 tab po qd X 1d then 2 tab po qd then 1 tab po qd 15 tablet 0  . pantoprazole (PROTONIX) 40  MG tablet Take 1 tablet (40 mg total) by mouth daily. 90 tablet 0  . tiZANidine (ZANAFLEX) 2 MG tablet TAKE 0.5-2 TABLETS (1-4 MG TOTAL) BY MOUTH EVERY 8 (EIGHT) HOURS AS NEEDED FOR MUSCLE SPASMS. 120 tablet 0   No current facility-administered medications on file prior to visit.    BP (!) 165/100   Pulse 95   Temp 98.4 F (36.9 C)   Resp 20   Ht 5\' 5"  (1.651 m)   Wt 105 lb 9.6 oz (47.9 kg)   LMP  (LMP Unknown)   SpO2 100%   BMI 17.57 kg/m       Objective:   Physical Exam  General Mental Status- Alert. General Appearance- Not in acute distress.   Skin General: Color- Normal Color. Moisture- Normal Moisture.  Neck Carotid Arteries- Normal color. Moisture- Normal Moisture. No carotid bruits. No JVD.  Chest and Lung Exam Auscultation: Breath Sounds:-Normal.  Cardiovascular Auscultation:Rythm- Regular. Murmurs & Other Heart Sounds:Auscultation of the heart reveals- No Murmurs.  Abdomen Inspection:-Inspeection Normal. Palpation/Percussion:Note:No mass. Palpation and Percussion of the abdomen reveal- mild rt upper quadrant Tender,moderte to severe lower quadrant pain. Worse on rt side(old abd scar seen)  Distended + BS, no rebound but  Moderate to severe pain.   Neurologic Cranial Nerve exam:- CN III-XII intact(No nystagmus), symmetric smile. Strength:- 5/5 equal and symmetric strength both upper and lower extremities.  Back- no cva tenderness.    Assessment & Plan:  Pt has recent moderate to severe abdomen pain for 3 weeks. Vomits after eating.   Mild rt upper quadrant pain on exam. Moderate to severe lower pain.   Will get stat cbc, cmp and lipase.  Rx zofran for nausea or vomiting.   UA and urine culture  today.  Srtess hydrate well with propel fitness water or sugar free gatorade. Bland foods. Stop any alcohol.  Ct abdomen pelvis. Discussed with radiology staff/ct tech. Will order ct abd pelvis with oral contrast.  After studies will decide on further treatment. Possible antibiotics.  Pt has hx of  Colon polyp. Will place referral as not sure when should repeat?  Follow up in 7 days or as needed  Mackie Pai, PA-C   Time spent with patient today was 42  minutes which consisted of chart revdiew, discussing diagnosis, work up treatment and documentation.

## 2020-08-03 NOTE — Addendum Note (Signed)
Addended by: Anabel Halon on: 08/03/2020 10:37 PM   Modules accepted: Orders

## 2020-08-03 NOTE — Patient Instructions (Addendum)
Pt has recent moderate to severe abdomen pain for 3 weeks. Vomits after eating.   Mild rt upper quadrant pain on exam. Moderate to severe lower pain.   Will get stat cbc, cmp and lipase.  Rx zofran for nausea or vomiting.   UA and urine culture today.  Srtess hydrate well with propel fitness water or sugar free gatorade. Bland foods. Stop any alcohol.  Ct abdomen pelvis. Discussed with radiology staff/ct tech. Will order ct abd pelvis with oral contrast.  After studies will decide on further treatment. Possible antibiotics.  Pt has hx of  Colon polyp. Will place referral as not sure when should repeat?  Blood in urine. Will repeat ua on follow up. Culture pending.  Follow up in 7 days or as needed  Referral staff notified me that patient's insurance never gives immediate stat clearance.  Typically within 24hours is expected timeframe on authorization request.  Advised patient that if her pain worsens then go ahead and go to the emergency department tonight.  Patient expressed understanding.  Also if pain is the same but approaching the weekend then advise ED evaluation as well.

## 2020-08-04 ENCOUNTER — Other Ambulatory Visit (HOSPITAL_BASED_OUTPATIENT_CLINIC_OR_DEPARTMENT_OTHER): Payer: Self-pay

## 2020-08-04 LAB — URINE CULTURE
MICRO NUMBER:: 11905738
Result:: NO GROWTH
SPECIMEN QUALITY:: ADEQUATE

## 2020-08-05 ENCOUNTER — Telehealth: Payer: Self-pay | Admitting: Medical

## 2020-08-05 NOTE — Telephone Encounter (Signed)
Pt called and she stated she will go to the ER

## 2020-08-05 NOTE — Telephone Encounter (Signed)
Will you call pt and see if she has started antbiotics I sent in. Has pt ct abd and pelvis been prior authorized. Want her to get study before weekend.

## 2020-08-05 NOTE — Telephone Encounter (Signed)
Advise pt to go to the ED. I had ordered the CT stat. Don't want to wait until 08/10/2020 since she is vomiting despit treatment recommend ED evaluation.

## 2020-08-05 NOTE — Telephone Encounter (Signed)
Patient states she has started the medications and she is still throwing up and hurting and she is getting her CT on 08/10/20

## 2020-08-07 ENCOUNTER — Emergency Department (HOSPITAL_COMMUNITY): Payer: 59

## 2020-08-07 ENCOUNTER — Emergency Department (HOSPITAL_COMMUNITY)
Admission: EM | Admit: 2020-08-07 | Discharge: 2020-08-07 | Disposition: A | Discharge status: Home / Self Care | Payer: 59 | Attending: Emergency Medicine | Admitting: Emergency Medicine

## 2020-08-07 ENCOUNTER — Other Ambulatory Visit: Payer: Self-pay

## 2020-08-07 ENCOUNTER — Encounter (HOSPITAL_COMMUNITY): Payer: Self-pay | Admitting: Emergency Medicine

## 2020-08-07 DIAGNOSIS — Z7982 Long term (current) use of aspirin: Secondary | ICD-10-CM | POA: Insufficient documentation

## 2020-08-07 DIAGNOSIS — I10 Essential (primary) hypertension: Secondary | ICD-10-CM | POA: Diagnosis not present

## 2020-08-07 DIAGNOSIS — R1110 Vomiting, unspecified: Secondary | ICD-10-CM | POA: Insufficient documentation

## 2020-08-07 DIAGNOSIS — K5900 Constipation, unspecified: Secondary | ICD-10-CM | POA: Diagnosis not present

## 2020-08-07 DIAGNOSIS — F17210 Nicotine dependence, cigarettes, uncomplicated: Secondary | ICD-10-CM | POA: Insufficient documentation

## 2020-08-07 DIAGNOSIS — Z79899 Other long term (current) drug therapy: Secondary | ICD-10-CM | POA: Diagnosis not present

## 2020-08-07 DIAGNOSIS — R1013 Epigastric pain: Secondary | ICD-10-CM

## 2020-08-07 DIAGNOSIS — Z8719 Personal history of other diseases of the digestive system: Secondary | ICD-10-CM | POA: Insufficient documentation

## 2020-08-07 LAB — CBC WITH DIFFERENTIAL/PLATELET
Abs Immature Granulocytes: 0.03 10*3/uL (ref 0.00–0.07)
Basophils Absolute: 0 10*3/uL (ref 0.0–0.1)
Basophils Relative: 1 %
Eosinophils Absolute: 0.1 10*3/uL (ref 0.0–0.5)
Eosinophils Relative: 1 %
HCT: 41.6 % (ref 36.0–46.0)
Hemoglobin: 13.5 g/dL (ref 12.0–15.0)
Immature Granulocytes: 0 %
Lymphocytes Relative: 32 %
Lymphs Abs: 2.5 10*3/uL (ref 0.7–4.0)
MCH: 27.8 pg (ref 26.0–34.0)
MCHC: 32.5 g/dL (ref 30.0–36.0)
MCV: 85.8 fL (ref 80.0–100.0)
Monocytes Absolute: 0.4 10*3/uL (ref 0.1–1.0)
Monocytes Relative: 5 %
Neutro Abs: 4.8 10*3/uL (ref 1.7–7.7)
Neutrophils Relative %: 61 %
Platelets: 329 10*3/uL (ref 150–400)
RBC: 4.85 MIL/uL (ref 3.87–5.11)
RDW: 13.6 % (ref 11.5–15.5)
WBC: 7.9 10*3/uL (ref 4.0–10.5)
nRBC: 0 % (ref 0.0–0.2)

## 2020-08-07 LAB — COMPREHENSIVE METABOLIC PANEL
ALT: 26 U/L (ref 0–44)
AST: 33 U/L (ref 15–41)
Albumin: 4.3 g/dL (ref 3.5–5.0)
Alkaline Phosphatase: 58 U/L (ref 38–126)
Anion gap: 8 (ref 5–15)
BUN: 9 mg/dL (ref 8–23)
CO2: 23 mmol/L (ref 22–32)
Calcium: 9.1 mg/dL (ref 8.9–10.3)
Chloride: 107 mmol/L (ref 98–111)
Creatinine, Ser: 0.56 mg/dL (ref 0.44–1.00)
GFR, Estimated: >60 mL/min (ref >60)
Glucose, Bld: 91 mg/dL (ref 70–99)
Potassium: 3.7 mmol/L (ref 3.5–5.1)
Sodium: 138 mmol/L (ref 135–145)
Total Bilirubin: 0.3 mg/dL (ref 0.3–1.2)
Total Protein: 7.5 g/dL (ref 6.5–8.1)

## 2020-08-07 LAB — LIPASE, BLOOD: Lipase: 38 U/L (ref 11–51)

## 2020-08-07 LAB — URINALYSIS, ROUTINE W REFLEX MICROSCOPIC
Bacteria, UA: NONE SEEN
Bilirubin Urine: NEGATIVE
Glucose, UA: NEGATIVE mg/dL
Ketones, ur: 20 mg/dL — AB
Leukocytes,Ua: NEGATIVE
Nitrite: NEGATIVE
Protein, ur: NEGATIVE mg/dL
Specific Gravity, Urine: 1.017 (ref 1.005–1.030)
pH: 6 (ref 5.0–8.0)

## 2020-08-07 MED ORDER — ONDANSETRON HCL 4 MG/2ML IJ SOLN
4.0000 mg | Freq: Once | INTRAMUSCULAR | Status: AC
  Administered 2020-08-07: 4 mg via INTRAVENOUS
  Filled 2020-08-07: qty 2

## 2020-08-07 MED ORDER — LACTULOSE 10 GM/15ML PO SOLN
30.0000 g | Freq: Once | ORAL | Status: AC
  Administered 2020-08-07: 30 g via ORAL
  Filled 2020-08-07: qty 60

## 2020-08-07 MED ORDER — ONDANSETRON 4 MG PO TBDP
4.0000 mg | ORAL_TABLET | Freq: Once | ORAL | Status: AC
  Administered 2020-08-07: 4 mg via ORAL
  Filled 2020-08-07: qty 1

## 2020-08-07 MED ORDER — FLEET ENEMA 7-19 GM/118ML RE ENEM: 1.0000 | ENEMA | Freq: Once | RECTAL | Status: DC

## 2020-08-07 MED ORDER — MORPHINE SULFATE (PF) 4 MG/ML IV SOLN
4.0000 mg | Freq: Once | INTRAVENOUS | Status: AC
  Administered 2020-08-07: 4 mg via INTRAVENOUS
  Filled 2020-08-07: qty 1

## 2020-08-07 MED ORDER — IOHEXOL 350 MG/ML SOLN
100.0000 mL | Freq: Once | INTRAVENOUS | Status: AC | PRN
  Administered 2020-08-07: 100 mL via INTRAVENOUS

## 2020-08-07 MED ORDER — SUCRALFATE 1 G PO TABS: 1.0000 g | ORAL_TABLET | Freq: Three times a day (TID) | ORAL | 0 refills | Status: DC

## 2020-08-07 MED ORDER — DICYCLOMINE HCL 20 MG PO TABS: 20.0000 mg | ORAL_TABLET | Freq: Two times a day (BID) | ORAL | 0 refills | Status: DC

## 2020-08-07 MED ORDER — HYDROMORPHONE HCL 1 MG/ML IJ SOLN
1.0000 mg | Freq: Once | INTRAMUSCULAR | Status: AC
Start: 2020-08-07 — End: 2020-08-07
  Administered 2020-08-07: 1 mg via INTRAVENOUS
  Filled 2020-08-07: qty 1

## 2020-08-07 MED ORDER — SODIUM CHLORIDE 0.9 % IV BOLUS
1000.0000 mL | Freq: Once | INTRAVENOUS | Status: AC
  Administered 2020-08-07: 1000 mL via INTRAVENOUS

## 2020-08-07 MED ORDER — PANTOPRAZOLE SODIUM 40 MG IV SOLR
40.0000 mg | Freq: Once | INTRAVENOUS | Status: AC
  Administered 2020-08-07: 40 mg via INTRAVENOUS
  Filled 2020-08-07: qty 40

## 2020-08-07 MED ORDER — ESOMEPRAZOLE MAGNESIUM 40 MG PO CPDR: 40.0000 mg | DELAYED_RELEASE_CAPSULE | Freq: Every day | ORAL | 0 refills | Status: DC

## 2020-08-07 NOTE — Discharge Instructions (Signed)
You may have gastritis.  Take Nexium daily.  Take Carafate as prescribed  Take Bentyl for cramps.  Stay hydrated.  You need to talk to your GI doctor about getting endoscopy  You have constipation on your CT as well.  Continue your current bowel regimen  Return to ER if you have severe abdominal pain, vomiting, fevers.

## 2020-08-07 NOTE — ED Notes (Signed)
Introduced myself to patient. She is A&Ox4. Patient takes stool softeners at home.

## 2020-08-07 NOTE — ED Provider Notes (Signed)
[facility] DEPT Provider Note   CSN: [id number] Arrival date & time: 08/07/20  1236     History Chief Complaint  Patient presents with  . Emesis  . Abdominal Pain    Ruth Bell is a 60 y.o. female hx of GERD, HTN, previous hysterectomy, who presented with abdominal pain.  Patient has epigastric pain for the last 3 weeks.  She states that it progressively gets worse.  She states that she is unable to keep anything down.  She states that she did have a normal bowel movement today.  She does have a history of hysterectomy but has no history of SBO in the past.  Patient saw her doctor outpatient was ordered for outpatient CT but she did not get it yet.  She states that since her pain was getting worse she was told to come in to get a CT abdomen pelvis.   The history is provided by the patient.       Past Medical History:  Diagnosis Date  . Anemia   . Anxiety   . Arm skin lesion, left 09/05/2014  . Constipation 12/27/2013  . Depression   . Dysphagia 04/15/2016  . Eczema 08/22/2015  . GERD (gastroesophageal reflux disease)   . Hematuria 03/04/2016  . History of shingles 01/22/2016  . Hyperglycemia 01/15/2015  . Hyperlipidemia, mixed 08/22/2015  . Hypertension   . Menometrorrhagia 2006  . Migraine, unspecified, without mention of intractable migraine without mention of status migrainosus   . Preventative health care 11/04/2016  . Pseudoseizures ([place])   . Seizures ([place])    history of pseudoseizure disorder, last last seizure 3 wks, keppra inc in dosage  . Tubular adenoma of colon 05/2011  . Uterine leiomyoma 2006    Patient Active Problem List   Diagnosis Date Noted  . Urinary frequency 05/07/2017  . Abdominal bloating 05/07/2017  . Back pain 03/21/2017  . Tachycardia 02/28/2017  . Preventative health care 11/04/2016  . Hoarseness 04/15/2016  . Dysphagia 04/15/2016  . Anterior chest wall pain 04/13/2016  . Dysuria 03/04/2016  . Hematuria  03/04/2016  . Palpitations 01/30/2016  . History of shingles 01/22/2016  . Eczema 08/22/2015  . Hyperlipidemia, mixed 08/22/2015  . Hyperglycemia 01/15/2015  . Lumbar radiculopathy 11/19/2014  . Arm skin lesion, left 09/05/2014  . Noncompliance with medications 08/11/2014  . Constipation 12/27/2013  . Pain in joint, shoulder region 10/13/2013  . Shoulder pain 09/23/2013  . Numbness and tingling in right hand 09/23/2013  . Jaw pain 03/26/2013  . Neck pain 05/17/2012  . Seizure disorder ([place]) 02/21/2012  . Non compliance w medication regimen 02/21/2012  . Essential hypertension 09/06/2011  . Abdominal pain 06/20/2011  . Hemorrhoids 06/20/2011  . Hx of adenomatous colonic polyps 06/13/2011  . Musculoskeletal chest pain 04/23/2011  . Radiculopathy of leg 02/02/2011  . History of pseudoseizure 07/10/2010  . Anxiety and depression 12/29/2009  . TINEA PEDIS 06/02/2009  . Exertional dyspnea 01/13/2008  . MICROSCOPIC HEMATURIA 06/24/2007  . WEIGHT LOSS 04/28/2007  . Anemia 03/10/2007  . Migraine 03/10/2007  . ARTHRITIS 03/10/2007  . SYNCOPE 03/10/2007    Past Surgical History:  Procedure Laterality Date  . ABDOMINAL HYSTERECTOMY  04/06/04  . APPENDECTOMY    . BILATERAL SALPINGOOPHORECTOMY  04/06/04  . CARDIAC EVENT MONITOR  02/2016   Mostly sinus bradycardia and sinus rhythm.  Rare PVCs and PACs.  No arrhythmias.  Symptoms of chest pain and fluttering as well as "passed out spell" noted with normal  sinus rhythm.  Marland Kitchen MASS EXCISION Left 06/02/2015   Procedure: EXCISION LEFT ARM MASS;  Surgeon: Autumn Messing III, MD;  Location: [facility];  Service: General;  Laterality: Left;  . NM MYOVIEW LTD  04/2016   Reached heart rate of 127 bpm with Lexiscan.  EF 60 to 65%.  LOW RISK.  No ischemia or infarction.  Marland Kitchen ROTATOR CUFF REPAIR    . TRANSTHORACIC ECHOCARDIOGRAM  01/2016   F 55-6%.  No R WMA.  GR 1 DD.  Normal valves.     OB History   No obstetric history on file.      Family History  Problem Relation Age of Onset  . Heart disease Mother   . Heart attack Mother   . Cancer Father        colon  . Hypertension Father   . Cancer Sister        brain  . Cancer Brother   . Hypertension Sister   . Cancer Sister        unsure of type  . Diabetes Sister        type 2  . Heart attack Sister   . Hypertension Brother   . Colon cancer Neg Hx     Social History   Tobacco Use  . Smoking status: Light Tobacco Smoker    Packs/day: 0.50    Years: 40.00    Pack years: 20.00    Types: Cigarettes    Start date: 12/18/2015  . Smokeless tobacco: Never Used  . Tobacco comment: 3-4 cigarettes daily  Vaping Use  . Vaping Use: Never used  Substance Use Topics  . Alcohol use: Yes    Comment: wine occassioanlly   . Drug use: No    Home Medications Prior to Admission medications   Medication Sig Start Date End Date Taking? Authorizing Provider  amLODipine (NORVASC) 5 MG tablet Take 1 tablet (5 mg total) by mouth daily. 03/17/20   Mosie Lukes, MD  Aspirin-Caffeine (BAYER BACK & BODY) 500-32.5 MG TABS Take 1 tablet by mouth 2 (two) times daily as needed (pain).    [provider]  atorvastatin (LIPITOR) 10 MG tablet TAKE 1 TABLET BY MOUTH  DAILY 11/26/17   Mosie Lukes, MD  Butalbital-Acetaminophen 50-300 MG TABS Take 1 tablet by mouth 2 (two) times daily as needed. 01/29/19   Mosie Lukes, MD  ciprofloxacin (CIPRO) 500 MG tablet Take 1 tablet (500 mg total) by mouth 2 (two) times daily. 08/03/20   Saguier, Percell Miller, PA-C  colchicine 0.6 MG tablet Take 2 tabs by mouth now, and repeat in 1 hour.  If symptoms are not improved by tomorrow- may repeat same dosing tomorrow 03/29/20   Debbrah Alar, NP  COVID-19 mRNA vaccine, Moderna, 100 MCG/0.5ML injection INJECT AS DIRECTED 03/29/20 03/29/21  Carlyle Basques, MD  cyclobenzaprine (FLEXERIL) 10 MG tablet Take 1 tablet (10 mg total) by mouth at bedtime. Please dc the 90 day rx with one refill. Only  give 30 tabs. 11/07/17   Saguier, Percell Miller, PA-C  famciclovir (FAMVIR) 500 MG tablet Take 1 tablet (500 mg total) by mouth 3 (three) times daily. 12/21/19   Saguier, Percell Miller, PA-C  gabapentin (NEURONTIN) 300 MG capsule Take 300 mg by mouth 3 (three) times daily.    [provider]  ibuprofen (ADVIL) 600 MG tablet Take 1 tablet (600 mg total) by mouth every 6 (six) hours as needed for up to 30 doses for mild pain or moderate pain. 04/25/20  Mosie Lukes, MD  methylPREDNISolone (MEDROL) 4 MG tablet 5 tab po qd X 1d then 4 tab po qd X 1d then 3 tab po qd X 1d then 2 tab po qd then 1 tab po qd 04/07/20   Mosie Lukes, MD  metroNIDAZOLE (FLAGYL) 500 MG tablet Take 1 tablet (500 mg total) by mouth 3 (three) times daily for 7 days. 08/03/20 08/11/20  Saguier, Percell Miller, PA-C  ondansetron (ZOFRAN ODT) 4 MG disintegrating tablet Take 1 tablet (4 mg total) by mouth every 8 (eight) hours as needed for nausea or vomiting. 08/03/20   Saguier, Percell Miller, PA-C  pantoprazole (PROTONIX) 40 MG tablet Take 1 tablet (40 mg total) by mouth daily. 12/09/17   Mosie Lukes, MD  tiZANidine (ZANAFLEX) 2 MG tablet TAKE 0.5-2 TABLETS (1-4 MG TOTAL) BY MOUTH EVERY 8 (EIGHT) HOURS AS NEEDED FOR MUSCLE SPASMS. 07/03/20   Mosie Lukes, MD    Allergies    Patient has no known allergies.  Review of Systems   Review of Systems  Gastrointestinal: Positive for abdominal pain and vomiting.  All other systems reviewed and are negative.   Physical Exam Updated Vital Signs BP (!) 146/93 (BP Location: Left Arm)   Pulse 66   Temp 98.6 F (37 C) (Oral)   Resp 18   LMP  (LMP Unknown)   SpO2 96%   Physical Exam Vitals and nursing note reviewed.  Constitutional:      Comments: Uncomfortable  HENT:     Head: Normocephalic.     Mouth/Throat:     Pharynx: Oropharynx is clear.  Eyes:     Extraocular Movements: Extraocular movements intact.     Pupils: Pupils are equal, round, and reactive to light.  Cardiovascular:      Rate and Rhythm: Normal rate and regular rhythm.  Pulmonary:     Effort: Pulmonary effort is normal.     Breath sounds: Normal breath sounds.  Abdominal:     Comments: Distended, mild diffuse tenderness worse in the epigastric area  Skin:    General: Skin is warm.  Neurological:     General: No focal deficit present.     Mental Status: She is oriented to person, place, and time.  Psychiatric:        Mood and Affect: Mood normal.        Behavior: Behavior normal.     ED Results / Procedures / Treatments   Labs (all labs ordered are listed, but only abnormal results are displayed) Labs Reviewed  URINALYSIS, ROUTINE W REFLEX MICROSCOPIC - Abnormal; Notable for the following components:      Result Value   Hgb urine dipstick MODERATE (*)    Ketones, ur 20 (*)    All other components within normal limits  CBC WITH DIFFERENTIAL/PLATELET  COMPREHENSIVE METABOLIC PANEL  LIPASE, BLOOD    EKG None  Radiology CT ABDOMEN PELVIS WO CONTRAST  Result Date: 08/07/2020 CLINICAL DATA:  Abdominal distension and pain. Right lower quadrant pain. Hematuria. Nausea and vomiting. EXAM: CT ABDOMEN AND PELVIS WITHOUT CONTRAST TECHNIQUE: Multidetector CT imaging of the abdomen and pelvis was performed following the standard protocol without IV contrast. COMPARISON:  Contrast enhanced CT 02/31/2019 FINDINGS: Lower chest: Mild left basilar scarring. No acute airspace disease. Heart size normal. Hepatobiliary: Unenhanced liver is unremarkable. Gallbladder physiologically distended, no calcified stone. No biliary dilatation. Pancreas: No ductal dilatation or inflammation. Spleen: Small in size, stable. Adrenals/Urinary Tract: Normal right adrenal gland. Minimal left adrenal thickening without focal  nodule. No hydronephrosis or perinephric edema. 4.6 cm simple cyst in the mid posterior right kidney. A right retroperitoneal calcification was seen on prior exam, was not in the course of the ureter. No renal  calculi. Unremarkable unenhanced left kidney. Urinary bladder is near completely empty. No bladder stone or wall thickening. Stomach/Bowel: Bowel assessment limited in the absence of enteric contrast and paucity of intra-abdominal fat. Decompressed stomach. No small bowel obstruction or inflammation. Small bowel is decompressed. Appendix not visualized, appendectomy per history. Low lying cecum in the deep right pelvis. Moderate colonic stool burden. Mild colonic redundancy. No colonic wall thickening or inflammation. No abnormal rectal distention. Vascular/Lymphatic: Moderate aortic atherosclerosis. No aortic aneurysm. No bulky abdominopelvic adenopathy. Reproductive: Status post hysterectomy. No adnexal masses. Other: Calcifications in the right retroperitoneum are unchanged from prior exam and consistent with phleboliths. No ascites or free air. No abdominal wall hernia. Musculoskeletal: There are no acute or suspicious osseous abnormalities. IMPRESSION: 1. No renal stones or obstructive uropathy. No acute abnormality in the abdomen/pelvis. 2. Moderate colonic stool burden with colonic redundancy, suggesting constipation. Aortic Atherosclerosis (ICD10-I70.0). Electronically Signed   By: Keith Rake M.D.   On: 08/07/2020 16:48   CT Angio Abd/Pel W and/or Wo Contrast  Result Date: 08/07/2020 CLINICAL DATA:  Mesenteric ischemia. Diffuse abdominal pain. Radiating to back. Blood in urine. EXAM: CTA ABDOMEN AND PELVIS WITHOUT AND WITH CONTRAST TECHNIQUE: Multidetector CT imaging of the abdomen and pelvis was performed using the standard protocol during bolus administration of intravenous contrast. Multiplanar reconstructed images and MIPs were obtained and reviewed to evaluate the vascular anatomy. CONTRAST:  177mL OMNIPAQUE IOHEXOL 350 MG/ML SOLN COMPARISON:  CT abdomen pelvis 08/07/2020. FINDINGS: VASCULAR Aorta: Mild to moderate calcified and noncalcified atherosclerotic plaque. Normal caliber aorta without  aneurysm, dissection, vasculitis or significant stenosis. Celiac: Patent without evidence of aneurysm, dissection, vasculitis or significant stenosis. SMA: Patent without evidence of aneurysm, dissection, vasculitis or significant stenosis. Renals: Mild atherosclerotic plaque. Total of 2 left renal arteries identified with a diminutive artery supplying the inferior left renal pole. Single right renal artery identified. Both renal arteries are patent without evidence of aneurysm, dissection, vasculitis, fibromuscular dysplasia or significant stenosis. IMA: Patent without evidence of aneurysm, dissection, vasculitis or significant stenosis. Inflow: Mild atherosclerotic plaque. Patent without evidence of aneurysm, dissection, vasculitis or significant stenosis. Proximal Outflow: Bilateral common femoral and visualized portions of the superficial and profunda femoral arteries are patent without evidence of aneurysm, dissection, vasculitis or significant stenosis. Veins: No portal venous or mesenteric venous gas identified. The portal, splenic, superior mesenteric veins are patent. Review of the MIP images confirms the above findings. NON-VASCULAR Lower chest: Bilateral lower lobe subsegmental atelectasis. No acute abnormality. Hepatobiliary: No focal liver abnormality. No gallstones, gallbladder wall thickening, or pericholecystic fluid. No biliary dilatation. Pancreas: No focal lesion. Normal pancreatic contour. No surrounding inflammatory changes. No main pancreatic ductal dilatation. Spleen: Normal in size without focal abnormality. Adrenals/Urinary Tract: No adrenal nodule bilaterally. Bilateral kidneys enhance symmetrically. Subcentimeter hypodensities are too small to characterize. There is a 5 cm fluid density lesion within the right kidney that likely represents a simple renal cyst. No hydronephrosis. No hydroureter. Circumferential urinary bladder wall thickening likely due to under distension. Stomach/Bowel:  Stomach is within normal limits. No evidence of bowel wall thickening or dilatation. No pneumatosis. Stool throughout the colon. Appendix appears normal. Lymphatic: No lymphadenopathy. Reproductive: Status post hysterectomy. No adnexal masses. Other: No intraperitoneal free fluid. No intraperitoneal free gas. No organized fluid collection. Musculoskeletal: No  abdominal wall hernia or abnormality. No suspicious lytic or blastic osseous lesions. No acute displaced fracture. Multilevel degenerative changes of the spine. IMPRESSION: VASCULAR 1. No acute vascular abnormality. 2.  Aortic Atherosclerosis (ICD10-I70.0). NON-VASCULAR 1. Constipation. 2. Otherwise no acute intra-abdominal intrapelvic abnormality. Electronically Signed   By: Iven Finn M.D.   On: 08/07/2020 22:19    Procedures Procedures   Medications Ordered in ED Medications  sodium chloride 0.9 % bolus 1,000 mL (0 mLs Intravenous Stopped 08/07/20 1808)  ondansetron (ZOFRAN) injection 4 mg (4 mg Intravenous Given 08/07/20 1705)  morphine 4 MG/ML injection 4 mg (4 mg Intravenous Given 08/07/20 1705)  pantoprazole (PROTONIX) injection 40 mg (40 mg Intravenous Given 08/07/20 1808)  lactulose (CHRONULAC) 10 GM/15ML solution 30 g (30 g Oral Given 08/07/20 2202)  HYDROmorphone (DILAUDID) injection 1 mg (1 mg Intravenous Given 08/07/20 2201)  iohexol (OMNIPAQUE) 350 MG/ML injection 100 mL (100 mLs Intravenous Contrast Given 08/07/20 2106)    ED Course  I have reviewed the triage vital signs and the nursing notes.  Pertinent labs & imaging results that were available during my care of the patient were reviewed by me and considered in my medical decision making (see chart for details).    MDM Rules/Calculators/A&P                         AKILI CUDA is a 60 y.o. female here presenting with abdominal pain and vomiting.  Consider SBO versus ileus versus colitis.  Will get CBC and CMP and CT abdomen pelvis.  7:20 pm CT showed constipation.   Patient did have a bowel movement before arrival.  Patient is still writhing around in severe pain despite multiple doses of pain meds.  We will give some lactulose and enema to help her have a bowel movement.  At this point given pain out of proportion to exam, will get a CTA to rule out mesenteric ischemia  10:44 PM CTA did not show any mesenteric ischemia.  Pain improved now.  I wonder if he has some gastritis.  Will start on PPI and Carafate and will have her follow-up with GI for endoscopy.  Will give Bentyl for cramps  Final Clinical Impression(s) / ED Diagnoses Final diagnoses:  None    Rx / DC Orders ED Discharge Orders    None       Drenda Freeze, MD 08/07/20 2244

## 2020-08-07 NOTE — ED Triage Notes (Signed)
Patient complains of diffuse abdominal pain x3 weeks radiating to her back, had blood in her urine and her PCP recommended a CT scan.

## 2020-08-07 NOTE — ED Provider Notes (Signed)
Emergency Medicine Provider Triage Evaluation Note  Ruth Bell , a 60 y.o. female  was evaluated in triage.  Pt complains of vomiting x 3 weeks with abdominal pain, distended, radiates to right side/back. Told she had blood in urine at PCP visit on 08/03/20. Is able to keep meds and water down. Reports urinary frequency without dysuria. No changes in bowel habits.  Review of Systems  Positive: Abdominal pain, vomiting Negative: Fever, changes in bowel habits, dysuria  Physical Exam  LMP  (LMP Unknown)  Gen:   Awake, no distress   Resp:  Normal effort  MSK:   Moves extremities without difficulty  Other:  Diffuse abdominal tenderness, distended   Medical Decision Making  Medically screening exam initiated at 12:41 PM.  Appropriate orders placed.  DANAYSIA RADER was informed that the remainder of the evaluation will be completed by another provider, this initial triage assessment does not replace that evaluation, and the importance of remaining in the ED until their evaluation is complete.     Tacy Learn, PA-C 08/07/20 1247    Daleen Bo, MD 08/07/20 [phone]

## 2020-08-10 ENCOUNTER — Ambulatory Visit (HOSPITAL_BASED_OUTPATIENT_CLINIC_OR_DEPARTMENT_OTHER): Payer: 59

## 2020-08-29 ENCOUNTER — Encounter: Payer: Self-pay | Admitting: Physician Assistant

## 2020-08-29 ENCOUNTER — Telehealth: Payer: Self-pay

## 2020-08-29 ENCOUNTER — Ambulatory Visit (INDEPENDENT_AMBULATORY_CARE_PROVIDER_SITE_OTHER): Payer: 59 | Admitting: Physician Assistant

## 2020-08-29 VITALS — BP 96/70 | HR 56 | Ht 65.0 in | Wt 104.5 lb

## 2020-08-29 DIAGNOSIS — R112 Nausea with vomiting, unspecified: Secondary | ICD-10-CM | POA: Diagnosis not present

## 2020-08-29 DIAGNOSIS — K5900 Constipation, unspecified: Secondary | ICD-10-CM

## 2020-08-29 DIAGNOSIS — Z86010 Personal history of colonic polyps: Secondary | ICD-10-CM

## 2020-08-29 DIAGNOSIS — R109 Unspecified abdominal pain: Secondary | ICD-10-CM | POA: Diagnosis not present

## 2020-08-29 DIAGNOSIS — K219 Gastro-esophageal reflux disease without esophagitis: Secondary | ICD-10-CM

## 2020-08-29 MED ORDER — METOCLOPRAMIDE HCL 10 MG PO TABS: 10.0000 mg | ORAL_TABLET | Freq: Four times a day (QID) | ORAL | 0 refills | Status: DC

## 2020-08-29 MED ORDER — ESOMEPRAZOLE MAGNESIUM 40 MG PO CPDR: 40.0000 mg | DELAYED_RELEASE_CAPSULE | Freq: Two times a day (BID) | ORAL | 2 refills | Status: DC

## 2020-08-29 NOTE — Telephone Encounter (Signed)
Ms. Ruth Bell has psychogenic non-epileptic shaking episodes that are triggered by pain and stress. From this standpoint, there is no contraindication to medically necessary surgery/procedures.

## 2020-08-29 NOTE — Progress Notes (Signed)
Chief Complaint: Follow-up ER visit abdominal pain and vomiting  HPI:    [name] is a 60 year old African-American female, known to Dr. Fuller Plan, with past medical history as listed below including seizures, reflux and others, who was referred to me by Ruth Lukes, MD for a follow-up after being seen in the ER for vomiting and abdominal pain.    07/02/2017 office visit Dr. Fuller Plan for recurrent right back/right flank/right lower abdominal pain.  Apparently had extensive evaluation over the past 2 years.  Most recent colonoscopy was May 2017 with 3 small polyps and internal hemorrhoids.  Repeat recommended in 5 years.  At that time patient given Robaxin as there is no underlying GI cause.    08/07/2020 patient seen in the ED for vomiting and abdominal pain.  At that time described epigastric pain for 3 weeks which was getting progressively worse and she was unable to keep anything down.  Urinalysis normal, CBC, CMP and lipase normal.  CT abdomen pelvis without contrast showed no renal stones or obstructive uropathy, no acute abnormality in the abdomen/pelvis, moderate colonic stool burden with colonic redundancy should testing constipation.    Today, the patient tells me that 4 weeks ago she started with acute abdominal pain and vomiting.  Tells me that she would eat and vomit anything that she put in her mouth which is still going on.  Along with this has been experiencing a right sided pain which is rated as an 8-9/10 and seems to radiate through to her back slightly.  Also daily reflux symptoms regardless of Nexium 40 mg daily.  Tells me currently she is constipated having only a very slow, "one little drop" of stool on a daily basis.  Did take some sort of laxative the other day was able to have what she calls a "good bowel movement" and all of her symptoms did feel some better.      When asked about her seizures she does tell me she had one a month ago but seems to get these when she is "anxious".     Denies fever, chills, weight loss, blood in her stool or symptoms that awaken her from sleep.  Past Medical History:  Diagnosis Date   Anemia    Anxiety    Arm skin lesion, left 09/05/2014   Constipation 12/27/2013   Depression    Dysphagia 04/15/2016   Eczema 08/22/2015   GERD (gastroesophageal reflux disease)    Hematuria 03/04/2016   History of shingles 01/22/2016   Hyperglycemia 01/15/2015   Hyperlipidemia, mixed 08/22/2015   Hypertension    Menometrorrhagia 2006   Migraine, unspecified, without mention of intractable migraine without mention of status migrainosus    Preventative health care 11/04/2016   Pseudoseizures ([place])    Seizures ([place])    history of pseudoseizure disorder, last last seizure 3 wks, keppra inc in dosage   Tubular adenoma of colon 05/2011   Uterine leiomyoma 2006    Past Surgical History:  Procedure Laterality Date   ABDOMINAL HYSTERECTOMY  04/06/04   APPENDECTOMY     BILATERAL SALPINGOOPHORECTOMY  04/06/04   CARDIAC EVENT MONITOR  02/2016   Mostly sinus bradycardia and sinus rhythm.  Rare PVCs and PACs.  No arrhythmias.  Symptoms of chest pain and fluttering as well as "passed out spell" noted with normal sinus rhythm.   MASS EXCISION Left 06/02/2015   Procedure: EXCISION LEFT ARM MASS;  Surgeon: Autumn Messing III, MD;  Location: [facility];  Service: General;  Laterality: Left;   NM MYOVIEW LTD  04/2016   Reached heart rate of 127 bpm with Lexiscan.  EF 60 to 65%.  LOW RISK.  No ischemia or infarction.   ROTATOR CUFF REPAIR     TRANSTHORACIC ECHOCARDIOGRAM  01/2016   F 55-6%.  No R WMA.  GR 1 DD.  Normal valves.    Current Outpatient Medications  Medication Sig Dispense Refill   amLODipine (NORVASC) 5 MG tablet Take 1 tablet (5 mg total) by mouth daily. 90 tablet 0   Aspirin-Caffeine (BAYER BACK & BODY) 500-32.5 MG TABS Take 1 tablet by mouth 2 (two) times daily as needed (pain).     atorvastatin (LIPITOR) 10 MG tablet TAKE 1 TABLET BY MOUTH   DAILY 90 tablet 1   Butalbital-Acetaminophen 50-300 MG TABS Take 1 tablet by mouth 2 (two) times daily as needed. 60 tablet 1   ciprofloxacin (CIPRO) 500 MG tablet Take 1 tablet (500 mg total) by mouth 2 (two) times daily. 14 tablet 0   colchicine 0.6 MG tablet Take 2 tabs by mouth now, and repeat in 1 hour.  If symptoms are not improved by tomorrow- may repeat same dosing tomorrow 6 tablet 0   cyclobenzaprine (FLEXERIL) 10 MG tablet Take 1 tablet (10 mg total) by mouth at bedtime. Please dc the 90 day rx with one refill. Only give 30 tabs. 30 tablet 0   dicyclomine (BENTYL) 20 MG tablet Take 1 tablet (20 mg total) by mouth 2 (two) times daily. 20 tablet 0   esomeprazole (NEXIUM) 40 MG capsule Take 1 capsule (40 mg total) by mouth daily. 30 capsule 0   famciclovir (FAMVIR) 500 MG tablet Take 1 tablet (500 mg total) by mouth 3 (three) times daily. 21 tablet 0   gabapentin (NEURONTIN) 300 MG capsule Take 300 mg by mouth 3 (three) times daily.     ibuprofen (ADVIL) 600 MG tablet Take 1 tablet (600 mg total) by mouth every 6 (six) hours as needed for up to 30 doses for mild pain or moderate pain. 30 tablet 0   methylPREDNISolone (MEDROL) 4 MG tablet 5 tab po qd X 1d then 4 tab po qd X 1d then 3 tab po qd X 1d then 2 tab po qd then 1 tab po qd 15 tablet 0   ondansetron (ZOFRAN ODT) 4 MG disintegrating tablet Take 1 tablet (4 mg total) by mouth every 8 (eight) hours as needed for nausea or vomiting. 20 tablet 0   pantoprazole (PROTONIX) 40 MG tablet Take 1 tablet (40 mg total) by mouth daily. 90 tablet 0   sucralfate (CARAFATE) 1 g tablet Take 1 tablet (1 g total) by mouth 4 (four) times daily -  with meals and at bedtime. 30 tablet 0   tiZANidine (ZANAFLEX) 2 MG tablet TAKE 0.5-2 TABLETS (1-4 MG TOTAL) BY MOUTH EVERY 8 (EIGHT) HOURS AS NEEDED FOR MUSCLE SPASMS. 120 tablet 0   No current facility-administered medications for this visit.    Allergies as of 08/29/2020   (No Known Allergies)    Family  History  Problem Relation Age of Onset   Heart disease Mother    Heart attack Mother    Cancer Father        colon   Hypertension Father    Cancer Sister        brain   Hypertension Sister    Cancer Sister        unsure of type   Diabetes Sister  type 2   Heart attack Sister    Cancer Brother    Hypertension Brother    Colon cancer Neg Hx    Esophageal cancer Neg Hx    Pancreatic cancer Neg Hx    Stomach cancer Neg Hx     Social History   Socioeconomic History   Marital status: Married    Spouse name: Ruth Bell   Number of children: 2   Years of education: Not on file   Highest education level: Not on file  Occupational History   Occupation: CMA-retired  Tobacco Use   Smoking status: Light Smoker    Packs/day: 0.50    Years: 40.00    Pack years: 20.00    Types: Cigarettes    Start date: 12/18/2015   Smokeless tobacco: Never   Tobacco comments:    3-4 cigarettes daily  Vaping Use   Vaping Use: Never used  Substance and Sexual Activity   Alcohol use: Yes    Comment: wine occassioanlly    Drug use: No   Sexual activity: Not on file    Comment: lives with husband, no dietary restrictions.   Other Topics Concern   Not on file  Social History Narrative   Lives with her husband and their son.   Daughter lives in [place], [place].   Social Determinants of Health   Financial Resource Strain: Not on file  Food Insecurity: Not on file  Transportation Needs: Not on file  Physical Activity: Not on file  Stress: Not on file  Social Connections: Not on file  Intimate Partner Violence: Not on file    Review of Systems:    Constitutional: No weight loss, fever or chills Skin: No rash  Cardiovascular: No chest pain  Respiratory: No SOB  Gastrointestinal: See HPI and otherwise negative Genitourinary: No dysuria  Neurological: No headache, dizziness or syncope Musculoskeletal: No new muscle or joint pain Hematologic: No bleeding  Psychiatric: No history of  depression or anxiety   Physical Exam:  Vital signs: BP 96/70 (BP Location: Right Arm, Patient Position: Sitting, Cuff Size: Normal)   Pulse (!) 56   Ht 5\' 5"  (1.651 m)   Wt 104 lb 8 oz (47.4 kg)   LMP  (LMP Unknown)   SpO2 98%   BMI 17.39 kg/m   Constitutional:   Pleasant AA female appears to be in NAD, Well developed, Well nourished, alert and cooperative Head:  Normocephalic and atraumatic. Eyes:   PEERL, EOMI. No icterus. Conjunctiva pink. Ears:  Normal auditory acuity. Neck:  Supple Throat: Oral cavity and pharynx without inflammation, swelling or lesion.  Respiratory: Respirations even and unlabored. Lungs clear to auscultation bilaterally.   No wheezes, crackles, or rhonchi.  Cardiovascular: Normal S1, S2. No MRG. Regular rate and rhythm. No peripheral edema, cyanosis or pallor.  Gastrointestinal:  Soft, mild distention mild right-sided TTP no rebound or guarding. Normal bowel sounds. No appreciable masses or hepatomegaly. Rectal:  Not performed.  Msk:  Symmetrical without gross deformities. Without edema, no deformity or joint abnormality.  Neurologic:  Alert and  oriented x4;  grossly normal neurologically.  Skin:   Dry and intact without significant lesions or rashes. Psychiatric: Demonstrates good judgement and reason without abnormal affect or behaviors.  RELEVANT LABS AND IMAGING: CBC    Component Value Date/Time   WBC 7.9 08/07/2020 1249   RBC 4.85 08/07/2020 1249   HGB 13.5 08/07/2020 1249   HCT 41.6 08/07/2020 1249   PLT 329 08/07/2020 1249   MCV 85.8  08/07/2020 1249   MCV 83.9 12/09/2015 1846   MCH 27.8 08/07/2020 1249   MCHC 32.5 08/07/2020 1249   RDW 13.6 08/07/2020 1249   LYMPHSABS 2.5 08/07/2020 1249   MONOABS 0.4 08/07/2020 1249   EOSABS 0.1 08/07/2020 1249   BASOSABS 0.0 08/07/2020 1249    CMP     Component Value Date/Time   NA 138 08/07/2020 1249   K 3.7 08/07/2020 1249   CL 107 08/07/2020 1249   CO2 23 08/07/2020 1249   GLUCOSE 91  08/07/2020 1249   BUN 9 08/07/2020 1249   CREATININE 0.56 08/07/2020 1249   CREATININE 0.58 12/21/2019 1135   CALCIUM 9.1 08/07/2020 1249   PROT 7.5 08/07/2020 1249   ALBUMIN 4.3 08/07/2020 1249   AST 33 08/07/2020 1249   ALT 26 08/07/2020 1249   ALKPHOS 58 08/07/2020 1249   BILITOT 0.3 08/07/2020 1249   GFRNONAA >60 08/07/2020 1249   GFRNONAA >89 05/16/2012 1349   GFRAA >60 05/17/2017 0953   GFRAA >89 05/16/2012 1349    Assessment: 1.  Constipation: Over the past 4 weeks, no acute changes in medications 2.  Nausea and vomiting: Possibly from constipation+/- reflux below 3.  GERD: Regardless of Nexium 40 mg once daily; consider relation to constipation+/- gastritis versus other 4.  Right-sided abdominal pain: There is a chronic element to this, see office visit notes from 2019 with Dr. Fuller Plan, I am not sure how much of this is chronic versus acute possibly related to constipation 5.  History of adenomatous polyps: Last colonoscopy in 2017 with recommendations to repeat in 5 years  Plan: 1.  Patient needs EGD and colonoscopy.  Did explain that she felt some better with nausea and vomiting symptoms after having a good bowel movement.  It is possible that a lot of her symptoms are related to constipation. She is overdue for colonoscopy for surveillance and well.  Due to timing and availability we will go ahead and schedule patient for colonoscopy now.  This was scheduled with Dr. Tarri Glenn on Friday as she had availability.  Did provide the patient a detailed list of risks for the procedure and she agrees to proceed.  She will continue to follow with Dr. Fuller Plan as her primary GI physician after time procedure. 2.  Due to history of seizures, most recently about a month ago we will send clearance to her neurologist.  Explained to the patient that if we do not receive clearance back before time for procedure then these will need to be delayed.  She verbalized understanding.  Tells [name] that mostly  they are controlled but occasionally she will have issues if she gets nervous. 3.  Patient will do a 2-day bowel prep given history of constipation. 4.  Prescribed Reglan 10 mg to be taken 20 minutes before each dose of bowel prep #2 with no refills. 5.  Discussed with patient that if she is having trouble with prep i.e. nausea/vomiting and unable to do this then she needs to call and let us know as soon as she can. 6.  Increased Nexium to 40 mg twice daily, 30-60 minutes before breakfast and dinner #60 with 5 refills. 7.  Patient to follow in clinic per recommendations from Dr. Tarri Glenn after time of procedure.  Again she may benefit from an EGD sometime in the near future if the cleanout for colonoscopy does not solve her upper GI issues.  Ruth Newer, PA-C [facility] 08/29/2020, 9:55 AM  Cc: Ruth Lukes, MD

## 2020-08-29 NOTE — Telephone Encounter (Signed)
   Ruth Bell November 01, 1960 [id number]  Dear Dr. Ellouise Newer:  We have scheduled the above named patient for a(n) Colonoscopy  procedure. Our records show that (s)he is being treated for seizures.  Please advise as to whether the patient is okay to have a Colonoscopy on 09/02/20.  Please route your response to [facility] or fax response to [phone].  Sincerely,    [facility]

## 2020-08-29 NOTE — Progress Notes (Signed)
Reviewed and agree with management plan.  Brilyn Tuller T. Kalliopi Coupland, MD FACG [phone]  

## 2020-08-29 NOTE — Progress Notes (Signed)
Reviewed and agree with management plans. ? ?Lauris Keepers L. Julia Kulzer, MD, MPH  ?

## 2020-08-29 NOTE — Patient Instructions (Signed)
If you are age 62 or older, your body mass index should be between 23-30. Your Body mass index is 17.39 kg/m. If this is out of the aforementioned range listed, please consider follow up with your Primary Care Provider.  If you are age 100 or younger, your body mass index should be between 19-25. Your Body mass index is 17.39 kg/m. If this is out of the aformentioned range listed, please consider follow up with your Primary Care Provider.   You have been scheduled for a colonoscopy. Please follow written instructions given to you at your visit today.  Please pick up your prep supplies at the pharmacy within the next 1-3 days. If you use inhalers (even only as needed), please bring them with you on the day of your procedure.   We have sent the following medications to your pharmacy for you to pick up at your convenience: Reglan10 mg and Nexium 40 mg.   We will contact Neurology to see if it is safe for you to have a Colonoscopy.    The [facility] providers would like to encourage you to use [facility] to communicate with providers for non-urgent requests or questions.  Due to long hold times on the telephone, sending your provider a message by [facility] may be a faster and more efficient way to get a response.  Please allow 48 business hours for a response.  Please remember that this is for non-urgent requests.   It was a pleasure to see you today!  Thank you for trusting me with your gastrointestinal care!    Ellouise Newer , PA-C

## 2020-08-29 NOTE — Telephone Encounter (Signed)
Called patient and spoke with her husband letting him know that she is okay to have her procedure done this Friday 09/02/20.

## 2020-09-02 ENCOUNTER — Encounter: Payer: Self-pay | Admitting: Gastroenterology

## 2020-09-02 ENCOUNTER — Ambulatory Visit: Payer: 59 | Admitting: Gastroenterology

## 2020-09-02 ENCOUNTER — Other Ambulatory Visit: Payer: Self-pay

## 2020-09-02 VITALS — BP 136/85 | HR 75 | Temp 97.8°F | Resp 17 | Ht 65.0 in | Wt 104.0 lb

## 2020-09-02 DIAGNOSIS — D128 Benign neoplasm of rectum: Secondary | ICD-10-CM

## 2020-09-02 DIAGNOSIS — K5900 Constipation, unspecified: Secondary | ICD-10-CM

## 2020-09-02 DIAGNOSIS — D120 Benign neoplasm of cecum: Secondary | ICD-10-CM

## 2020-09-02 DIAGNOSIS — D122 Benign neoplasm of ascending colon: Secondary | ICD-10-CM

## 2020-09-02 DIAGNOSIS — R109 Unspecified abdominal pain: Secondary | ICD-10-CM

## 2020-09-02 MED ORDER — SODIUM CHLORIDE 0.9 % IV SOLN: 500.0000 mL | Freq: Once | INTRAVENOUS | Status: DC

## 2020-09-02 NOTE — Progress Notes (Signed)
Called to room to assist during endoscopic procedure.  Patient ID and intended procedure confirmed with present staff. Received instructions for my participation in the procedure from the performing physician.  

## 2020-09-02 NOTE — Op Note (Signed)
[facility] Patient Name: Ruth Bell Procedure Date: 09/02/2020 9:03 AM MRN: [id number] Endoscopist: Thornton Park MD, MD Age: 60 Referring MD:  Date of Birth: 10/25/1960 Gender: Female Account #: [id number] Procedure:                Colonoscopy Indications:              Surveillance: Personal history of adenomatous                            polyps on last colonoscopy > 5 years ago                           History of polyps on prior procedures, most                            recently three tubular adenoma on colonoscopy 2017                            with Dr. Fuller Plan                           Incidental recent history of constipation and                            nausea that had improved prior to this procedure Medicines:                Monitored Anesthesia Care Procedure:                Pre-Anesthesia Assessment:                           - Prior to the procedure, a History and Physical                            was performed, and patient medications and                            allergies were reviewed. The patient's tolerance of                            previous anesthesia was also reviewed. The risks                            and benefits of the procedure and the sedation                            options and risks were discussed with the patient.                            All questions were answered, and informed consent                            was obtained. Prior Anticoagulants: The patient has  taken no previous anticoagulant or antiplatelet                            agents. ASA Grade Assessment: III - A patient with                            severe systemic disease. After reviewing the risks                            and benefits, the patient was deemed in                            satisfactory condition to undergo the procedure.                           After obtaining informed consent, the colonoscope                             was passed under direct vision. Throughout the                            procedure, the patient's blood pressure, pulse, and                            oxygen saturations were monitored continuously. The                            Olympus CF-HQ190L [id number]) Colonoscope was                            introduced through the anus and advanced to the 3                            cm into the ileum. A second forward view of the                            right colon was performed. The colonoscopy was                            performed without difficulty. The patient tolerated                            the procedure well. The quality of the bowel                            preparation was good. The terminal ileum, ileocecal                            valve, appendiceal orifice, and rectum were                            photographed. Scope In: 9:18:29 AM Scope Out: 9:35:11 AM Scope Withdrawal Time: 0 hours 14 minutes 10 seconds  Total Procedure Duration:  0 hours 16 minutes 42 seconds  Findings:                 The perianal and digital rectal examinations were                            normal.                           Non-bleeding internal hemorrhoids were found.                           Three sessile polyps were found in the rectum. The                            polyps were 1 to 3 mm in size. These polyps were                            removed with a cold snare. Resection and retrieval                            were complete. Estimated blood loss was minimal.                           A tattoo was seen in the transverse colon. The                            tattoo site appeared normal.                           A 2 mm polyp was found in the ascending colon. The                            polyp was sessile. The polyp was removed with a                            cold snare. Resection and retrieval were complete.                            Estimated blood loss was minimal.                            Two flat polyps were found in the cecum. The polyps                            were 3 mm in size. These polyps were removed with a                            cold snare. Resection and retrieval were complete.                            Estimated blood loss was minimal.  The exam was otherwise without abnormality on                            direct and retroflexion views. Complications:            No immediate complications. Estimated blood loss:                            Minimal. Estimated Blood Loss:     Estimated blood loss was minimal. Impression:               - Non-bleeding internal hemorrhoids.                           - Three 1 to 3 mm polyps in the rectum, removed                            with a cold snare. Resected and retrieved.                           - A tattoo was seen in the transverse colon. The                            tattoo site appeared normal.                           - One 2 mm polyp in the ascending colon, removed                            with a cold snare. Resected and retrieved.                           - Two 3 mm polyps in the cecum, removed with a cold                            snare. Resected and retrieved.                           - The examination was otherwise normal on direct                            and retroflexion views. Recommendation:           - Patient has a contact number available for                            emergencies. The signs and symptoms of potential                            delayed complications were discussed with the                            patient. Return to normal activities tomorrow.  Written discharge instructions were provided to the                            patient.                           - Resume previous diet.                           - Continue present medications.                           - Await pathology results.                            - Repeat colonoscopy date to be determined after                            pending pathology results are reviewed for                            surveillance.                           - Emerging evidence supports eating a diet of                            fruits, vegetables, grains, calcium, and yogurt                            while reducing red meat and alcohol may reduce the                            risk of colon cancer.                           - Thank you for allowing me to be involved in your                            colon cancer prevention. Thornton Park MD, MD 09/02/2020 9:43:40 AM This report has been signed electronically.

## 2020-09-02 NOTE — Progress Notes (Signed)
Report given to PACU, vss 

## 2020-09-02 NOTE — Patient Instructions (Signed)
Handouts on polyps & hemorrhoids given to you today  Await pathology results on polyps removed    YOU HAD AN ENDOSCOPIC PROCEDURE TODAY AT [facility]:   Refer to the procedure report that was given to you for any specific questions about what was found during the examination.  If the procedure report does not answer your questions, please call your gastroenterologist to clarify.  If you requested that your care partner not be given the details of your procedure findings, then the procedure report has been included in a sealed envelope for you to review at your convenience later.  YOU SHOULD EXPECT: Some feelings of bloating in the abdomen. Passage of more gas than usual.  Walking can help get rid of the air that was put into your GI tract during the procedure and reduce the bloating. If you had a lower endoscopy (such as a colonoscopy or flexible sigmoidoscopy) you may notice spotting of blood in your stool or on the toilet paper. If you underwent a bowel prep for your procedure, you may not have a normal bowel movement for a few days.  Please Note:  You might notice some irritation and congestion in your nose or some drainage.  This is from the oxygen used during your procedure.  There is no need for concern and it should clear up in a day or so.  SYMPTOMS TO REPORT IMMEDIATELY:  Following lower endoscopy (colonoscopy or flexible sigmoidoscopy):  Excessive amounts of blood in the stool  Significant tenderness or worsening of abdominal pains  Swelling of the abdomen that is new, acute  Fever of 100F or higher   For urgent or emergent issues, a gastroenterologist can be reached at any hour by calling [phone]. Do not use MyChart messaging for urgent concerns.    DIET:  We do recommend a small meal at first, but then you may proceed to your regular diet.  Drink plenty of fluids but you should avoid alcoholic beverages for 24 hours.  ACTIVITY:  You should plan to  take it easy for the rest of today and you should NOT DRIVE or use heavy machinery until tomorrow (because of the sedation medicines used during the test).    FOLLOW UP: Our staff will call the number listed on your records 48-72 hours following your procedure to check on you and address any questions or concerns that you may have regarding the information given to you following your procedure. If we do not reach you, we will leave a message.  We will attempt to reach you two times.  During this call, we will ask if you have developed any symptoms of COVID 19. If you develop any symptoms (ie: fever, flu-like symptoms, shortness of breath, cough etc.) before then, please call [phone].  If you test positive for Covid 19 in the 2 weeks post procedure, please call and report this information to [name].    If any biopsies were taken you will be contacted by phone or by letter within the next 1-3 weeks.  Please call us at [phone] if you have not heard about the biopsies in 3 weeks.    SIGNATURES/CONFIDENTIALITY: You and/or your care partner have signed paperwork which will be entered into your electronic medical record.  These signatures attest to the fact that that the information above on your After Visit Summary has been reviewed and is understood.  Full responsibility of the confidentiality of this discharge information lies with you and/or your  care-partner.  

## 2020-09-02 NOTE — Progress Notes (Signed)
Pt's states no medical or surgical changes since previsit or office visit. 

## 2020-09-06 ENCOUNTER — Telehealth: Payer: Self-pay

## 2020-09-06 NOTE — Telephone Encounter (Signed)
  Follow up Call-  Call back number 09/02/2020  Post procedure Call Back phone  # [phone]  Permission to leave phone message Yes  Some recent data might be hidden     Patient questions:  Do you have a fever, pain , or abdominal swelling? No. Pain Score  0 *  Have you tolerated food without any problems? Yes.    Have you been able to return to your normal activities? Yes.    Do you have any questions about your discharge instructions: Diet   No. Medications  No. Follow up visit  No.  Do you have questions or concerns about your Care? No.  Actions: * If pain score is 4 or above: No action needed, pain <4. Have you developed a fever since your procedure? no  2.   Have you had an respiratory symptoms (SOB or cough) since your procedure? no  3.   Have you tested positive for COVID 19 since your procedure no  4.   Have you had any family members/close contacts diagnosed with the COVID 19 since your procedure?  no   If yes to any of these questions please route to Joylene John, RN and Joella Prince, RN

## 2020-09-07 ENCOUNTER — Encounter: Payer: Self-pay | Admitting: Gastroenterology

## 2020-12-01 ENCOUNTER — Ambulatory Visit: Payer: 59 | Admitting: Neurology

## 2020-12-16 ENCOUNTER — Other Ambulatory Visit: Payer: Self-pay | Admitting: Physician Assistant

## 2021-01-11 ENCOUNTER — Telehealth: Payer: Self-pay | Admitting: Family Medicine

## 2021-01-11 DIAGNOSIS — Z111 Encounter for screening for respiratory tuberculosis: Secondary | ICD-10-CM

## 2021-01-11 NOTE — Telephone Encounter (Signed)
Pt. Husband called in and stated pt needs tb test for job.

## 2021-01-12 NOTE — Telephone Encounter (Signed)
Spoke with husband and they will do lab.  Patient scheduled for lab only visit.  Future order placed.

## 2021-01-16 ENCOUNTER — Other Ambulatory Visit (INDEPENDENT_AMBULATORY_CARE_PROVIDER_SITE_OTHER): Payer: 59

## 2021-01-16 ENCOUNTER — Other Ambulatory Visit: Payer: Self-pay

## 2021-01-16 DIAGNOSIS — Z111 Encounter for screening for respiratory tuberculosis: Secondary | ICD-10-CM

## 2021-01-18 ENCOUNTER — Encounter: Payer: Self-pay | Admitting: *Deleted

## 2021-01-18 LAB — QUANTIFERON-TB GOLD PLUS
Mitogen-NIL: >10 IU/mL
NIL: 0.04 IU/mL
QuantiFERON-TB Gold Plus: NEGATIVE
TB1-NIL: <0 IU/mL
TB2-NIL: <0 IU/mL

## 2021-01-22 ENCOUNTER — Encounter (HOSPITAL_BASED_OUTPATIENT_CLINIC_OR_DEPARTMENT_OTHER): Payer: Self-pay | Admitting: *Deleted

## 2021-01-22 ENCOUNTER — Emergency Department (HOSPITAL_BASED_OUTPATIENT_CLINIC_OR_DEPARTMENT_OTHER): Payer: 59

## 2021-01-22 ENCOUNTER — Emergency Department (HOSPITAL_BASED_OUTPATIENT_CLINIC_OR_DEPARTMENT_OTHER)
Admission: EM | Admit: 2021-01-22 | Discharge: 2021-01-22 | Disposition: A | Discharge status: Home / Self Care | Payer: 59 | Attending: Emergency Medicine | Admitting: Emergency Medicine

## 2021-01-22 DIAGNOSIS — R569 Unspecified convulsions: Secondary | ICD-10-CM | POA: Diagnosis present

## 2021-01-22 DIAGNOSIS — Z20822 Contact with and (suspected) exposure to covid-19: Secondary | ICD-10-CM | POA: Insufficient documentation

## 2021-01-22 DIAGNOSIS — R2990 Unspecified symptoms and signs involving the nervous system: Secondary | ICD-10-CM

## 2021-01-22 DIAGNOSIS — R531 Weakness: Secondary | ICD-10-CM | POA: Insufficient documentation

## 2021-01-22 DIAGNOSIS — I10 Essential (primary) hypertension: Secondary | ICD-10-CM | POA: Insufficient documentation

## 2021-01-22 DIAGNOSIS — M79602 Pain in left arm: Secondary | ICD-10-CM | POA: Diagnosis not present

## 2021-01-22 DIAGNOSIS — R519 Headache, unspecified: Secondary | ICD-10-CM | POA: Insufficient documentation

## 2021-01-22 DIAGNOSIS — Z79899 Other long term (current) drug therapy: Secondary | ICD-10-CM | POA: Diagnosis not present

## 2021-01-22 DIAGNOSIS — F17210 Nicotine dependence, cigarettes, uncomplicated: Secondary | ICD-10-CM | POA: Insufficient documentation

## 2021-01-22 DIAGNOSIS — M25512 Pain in left shoulder: Secondary | ICD-10-CM | POA: Insufficient documentation

## 2021-01-22 DIAGNOSIS — G40909 Epilepsy, unspecified, not intractable, without status epilepticus: Secondary | ICD-10-CM | POA: Insufficient documentation

## 2021-01-22 LAB — CBC
HCT: 44.4 % (ref 36.0–46.0)
Hemoglobin: 14.6 g/dL (ref 12.0–15.0)
MCH: 27.9 pg (ref 26.0–34.0)
MCHC: 32.9 g/dL (ref 30.0–36.0)
MCV: 84.7 fL (ref 80.0–100.0)
Platelets: 346 10*3/uL (ref 150–400)
RBC: 5.24 MIL/uL — ABNORMAL HIGH (ref 3.87–5.11)
RDW: 14.5 % (ref 11.5–15.5)
WBC: 8.5 10*3/uL (ref 4.0–10.5)
nRBC: 0 % (ref 0.0–0.2)

## 2021-01-22 LAB — COMPREHENSIVE METABOLIC PANEL
ALT: 16 U/L (ref 0–44)
AST: 24 U/L (ref 15–41)
Albumin: 4.5 g/dL (ref 3.5–5.0)
Alkaline Phosphatase: 62 U/L (ref 38–126)
Anion gap: 14 (ref 5–15)
BUN: 7 mg/dL — ABNORMAL LOW (ref 8–23)
CO2: 22 mmol/L (ref 22–32)
Calcium: 9.4 mg/dL (ref 8.9–10.3)
Chloride: 105 mmol/L (ref 98–111)
Creatinine, Ser: 0.63 mg/dL (ref 0.44–1.00)
GFR, Estimated: >60 mL/min (ref >60)
Glucose, Bld: 110 mg/dL — ABNORMAL HIGH (ref 70–99)
Potassium: 3.5 mmol/L (ref 3.5–5.1)
Sodium: 141 mmol/L (ref 135–145)
Total Bilirubin: 0.7 mg/dL (ref 0.3–1.2)
Total Protein: 8.1 g/dL (ref 6.5–8.1)

## 2021-01-22 LAB — DIFFERENTIAL
Abs Immature Granulocytes: 0.04 10*3/uL (ref 0.00–0.07)
Basophils Absolute: 0 10*3/uL (ref 0.0–0.1)
Basophils Relative: 1 %
Eosinophils Absolute: 0.1 10*3/uL (ref 0.0–0.5)
Eosinophils Relative: 1 %
Immature Granulocytes: 1 %
Lymphocytes Relative: 30 %
Lymphs Abs: 2.5 10*3/uL (ref 0.7–4.0)
Monocytes Absolute: 0.6 10*3/uL (ref 0.1–1.0)
Monocytes Relative: 7 %
Neutro Abs: 5.3 10*3/uL (ref 1.7–7.7)
Neutrophils Relative %: 60 %

## 2021-01-22 LAB — RAPID URINE DRUG SCREEN, HOSP PERFORMED
Amphetamines: NOT DETECTED
Barbiturates: NOT DETECTED
Benzodiazepines: NOT DETECTED
Cocaine: NOT DETECTED
Opiates: NOT DETECTED
Tetrahydrocannabinol: POSITIVE — AB

## 2021-01-22 LAB — RESP PANEL BY RT-PCR (FLU A&B, COVID) ARPGX2
Influenza A by PCR: NEGATIVE
Influenza B by PCR: NEGATIVE
SARS Coronavirus 2 by RT PCR: NEGATIVE

## 2021-01-22 LAB — URINALYSIS, ROUTINE W REFLEX MICROSCOPIC
Bilirubin Urine: NEGATIVE
Glucose, UA: NEGATIVE mg/dL
Ketones, ur: NEGATIVE mg/dL
Leukocytes,Ua: NEGATIVE
Nitrite: NEGATIVE
Protein, ur: NEGATIVE mg/dL
Specific Gravity, Urine: 1.01 (ref 1.005–1.030)
pH: 7 (ref 5.0–8.0)

## 2021-01-22 LAB — APTT: aPTT: 30 seconds (ref 24–36)

## 2021-01-22 LAB — URINALYSIS, MICROSCOPIC (REFLEX)

## 2021-01-22 LAB — PROTIME-INR
INR: 1 (ref 0.8–1.2)
Prothrombin Time: 12.8 seconds (ref 11.4–15.2)

## 2021-01-22 LAB — CBG MONITORING, ED: Glucose-Capillary: 98 mg/dL (ref 70–99)

## 2021-01-22 MED ORDER — LEVETIRACETAM IN NACL 1000 MG/100ML IV SOLN
1000.0000 mg | Freq: Once | INTRAVENOUS | Status: AC
  Administered 2021-01-22: 1000 mg via INTRAVENOUS
  Filled 2021-01-22: qty 100

## 2021-01-22 MED ORDER — LORAZEPAM 2 MG/ML IJ SOLN
1.0000 mg | INTRAMUSCULAR | Status: DC | PRN
  Administered 2021-01-22: 1 mg via INTRAVENOUS

## 2021-01-22 MED ORDER — LORAZEPAM 2 MG/ML IJ SOLN
INTRAMUSCULAR | Status: AC
  Filled 2021-01-22: qty 1

## 2021-01-22 MED ORDER — IOHEXOL 350 MG/ML SOLN
100.0000 mL | Freq: Once | INTRAVENOUS | Status: AC | PRN
  Administered 2021-01-22: 100 mL via INTRAVENOUS

## 2021-01-22 NOTE — ED Notes (Signed)
When ED MD in room, pt demonstrated seizure activity, both legs violently shaking, altered LOC was noted. Time approx 60 seconds.

## 2021-01-22 NOTE — ED Notes (Signed)
ED Provider at bedside. 

## 2021-01-22 NOTE — Consult Note (Signed)
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 HCT unemarkable. If symptoms improve can dc home and refer to psychiatry for further management (doesn't see them currently). If not improvement them may need to rediscuss admission for monitoring with husband who prefers to not have her admitted. He is insistent that these symptoms are from her known conversion disorder and consistent with prior episodes. I agree that her exam is non-physiological and consistent with conversion disorder as well with inconsistent mouth twisting which resolves with talking, anxiousness and non-rhythmic shaking/tremors, and inconsistent motor exam that improves with encouragement and with distraction. Will need evaluation of shoulder and arm pain per ED MD and they will address pain as well. NO further EEGs necessary (prior EEGs have captured convulsions which were non-epileptic). Would avoid treatment of further convulsions with benzos and AEDs and let them pass and keep her safe during them.  Metrics: Last Known Well: 01/21/2021 23:30:00 [facility] Notification Time: 01/22/2021 08:57:59 Arrival Time: 01/22/2021 08:46:00 Stamp Time: 01/22/2021 08:59:06 Initial Response Time: 01/22/2021 09:01:29 Symptoms: convulsions and L arm and head pain. NIHSS Start Assessment Time: 01/22/2021 09:05:17 Patient is not a candidate for  Thrombolytic. Thrombolytic Medical Decision: 01/22/2021 09:11:00 Patient was not deemed candidate for Thrombolytic because of following reasons: Last Well Known Above 4.5 Hours.  CT head was reviewed and results were: I personally Reviewed the CT Head and it Showed no ICH  ED Physician notified of diagnostic impression and management plan on 01/22/2021 09:26:37  Advanced Imaging: Advanced Imaging Not Completed because:  other diagnosis suspected   Sign Out:       Discussed with Emergency Department Provider    ------------------------------------------------------------------------------  History of Present Illness: Patient is a 60 year old Female.  Patient was brought by EMS for symptoms of convulsions and L arm and head pain.  9 F history of hypertension, hyperlipidemia, anxiety, depression, migraines, psychogenic non-epileptic events who presented with "twisted mouth". Yesterday had L arm and shoulder pain and headache (broken in the past). A few mon ago had a sz lasting 60 sec (shaking of b/l legs and unresponsive). 23:30 LKN. She doesn't see a psychiatrist. Pain induces spells per husband. No recent injuries but husband thinks she over exerts herself at work. Had these convulsions last about 6 months ago.   Past Medical History:      There is NO history of Stroke  Social History:Unable to obtain due to Patient Status  Family History:Unable to obtain due to Patient Status  Review of System:  14 Points Review of Systems was performed and was negative except mentioned in HPI.  Anticoagulant use:  No  Antiplatelet use: No  Allergies:  Reviewed    Examination: BP(169/107), Pulse(110), Blood Glucose(98) 1A: Level of Consciousness - Alert; keenly responsive + 0 1B: Ask Month and Age - 1 Question Right + 1 1C: Blink Eyes & Squeeze Hands - Performs Both Tasks + 0 2: Test  Horizontal Extraocular Movements - Normal + 0 3: Test Visual Fields - No Visual Loss + 0 4:  Test Facial Palsy (Use Grimace if Obtunded) - Normal symmetry + 0 5A: Test Left Arm Motor Drift - No Effort Against Gravity + 3 5B: Test Right Arm Motor Drift - No Drift for 10 Seconds + 0 6A: Test Left Leg Motor Drift - No Effort Against Gravity + 3 6B: Test Right Leg Motor Drift - Drift, but doesn't hit bed + 1 7: Test Limb Ataxia (FNF/Heel-Shin) - No Ataxia + 0 8: Test Sensation - Mild-Moderate Loss: Less Sharp/More Dull + 1 9: Test Language/Aphasia - Normal; No aphasia + 0 10: Test Dysarthria - Mild-Moderate Dysarthria: Slurring but can be understood + 1 11: Test Extinction/Inattention - No abnormality + 0  NIHSS Score: 10  NIHSS Free Text : no BTT on the L but attends to the L consistently prior to formal examination, left numbness. mouth twisting and biting of the right lips to distort the mouth which resolves with talking. She is anxious with non-rhythmic shaking/tremors, and inconsistent motor exam that improves with encouragement and with distraction  Pre-Morbid Modified Rankin Scale: 3 Points = Moderate disability; requiring some help, but able to walk without assistance   Patient/Family was informed the Neurology Consult would occur via TeleHealth consult by way of interactive audio and video telecommunications and consented to receiving care in this manner.   Patient is being evaluated for possible acute neurologic impairment and high probability of imminent or life-threatening deterioration. I spent total of 20 minutes providing care to this patient, including time for face to face visit via telemedicine, review of medical records, imaging studies and discussion of findings with providers, the patient and/or family.   Dr Deitra Mayo   [facility] [phone]  Case [id number]

## 2021-01-22 NOTE — Discharge Instructions (Addendum)
Please follow-up with your primary doctor.  If she has further seizures or strokelike symptoms, recommend returning to ER for reassessment.  There is an incidental finding on your CT scan.  The radiologist said your tonsils appeared mildly asymmetric and recommended being evaluated by an ear nose and throat specialist.  Call Dr. Janeice Robinson office on Monday to get a follow-up appointment for this finding.

## 2021-01-22 NOTE — ED Notes (Signed)
EDP immediately to room, Stroke Tele online [employer])

## 2021-01-22 NOTE — ED Notes (Signed)
Pt had seizure while in CT. Gave 2mg  lorazepam IV and seizure like activity subsided. Pt alert but drowsy at present

## 2021-01-22 NOTE — ED Triage Notes (Signed)
Presents with left arm pain, left shoulder to left side of head all day yesterday, family member states that at 60hrs last PM she was last normal, states got up this am and he noted mouth was "twisted"

## 2021-01-22 NOTE — ED Notes (Signed)
Pt was able to stand up and get into car independently from wheelchair

## 2021-01-22 NOTE — ED Provider Notes (Signed)
[facility] EMERGENCY DEPARTMENT Provider Note   CSN: [id number] Arrival date & time: 01/22/21  0846     History Chief Complaint  Patient presents with   Weakness    Ruth Bell is a 60 y.o. female.  Presented to ER with concern for Left-sided weakness, left facial droop, mouth "twisted".  Husband reports that they went to bed around 11:30 PM last night, patient was not having any symptoms at that time.  This morning patient was noted to have twisting of her left mouth, left facial droop.  Husband reports that when they got to the ER this morning she then developed left-sided weakness.  Had been complaining of some left arm pain and left shoulder pain and left head pain yesterday.  Husband reports that patient has had stress/anxiety induced seizures previously.  Does not take any antiepileptics.  Has also had prior episodes of weakness related to stress/anxiety.  No prior stroke.  HPI     Past Medical History:  Diagnosis Date   Anemia    Anxiety    Arm skin lesion, left 09/05/2014   Constipation 12/27/2013   Depression    Dysphagia 04/15/2016   Eczema 08/22/2015   GERD (gastroesophageal reflux disease)    Hematuria 03/04/2016   History of shingles 01/22/2016   Hyperglycemia 01/15/2015   Hyperlipidemia, mixed 08/22/2015   Hypertension    Menometrorrhagia 2006   Migraine, unspecified, without mention of intractable migraine without mention of status migrainosus    Preventative health care 11/04/2016   Pseudoseizures ([place])    Seizures ([place])    history of pseudoseizure disorder, last last seizure 3 wks, keppra inc in dosage   Tubular adenoma of colon 05/2011   Uterine leiomyoma 2006    Patient Active Problem List   Diagnosis Date Noted   Urinary frequency 05/07/2017   Abdominal bloating 05/07/2017   Back pain 03/21/2017   Tachycardia 02/28/2017   Preventative health care 11/04/2016   Hoarseness 04/15/2016   Dysphagia 04/15/2016   Anterior chest wall pain  04/13/2016   Dysuria 03/04/2016   Hematuria 03/04/2016   Palpitations 01/30/2016   History of shingles 01/22/2016   Eczema 08/22/2015   Hyperlipidemia, mixed 08/22/2015   Hyperglycemia 01/15/2015   Lumbar radiculopathy 11/19/2014   Arm skin lesion, left 09/05/2014   Noncompliance with medications 08/11/2014   Constipation 12/27/2013   Pain in joint, shoulder region 10/13/2013   Shoulder pain 09/23/2013   Numbness and tingling in right hand 09/23/2013   Jaw pain 03/26/2013   Neck pain 05/17/2012   Seizure disorder ([place]) 02/21/2012   Non compliance w medication regimen 02/21/2012   Essential hypertension 09/06/2011   Abdominal pain 06/20/2011   Hemorrhoids 06/20/2011   Hx of adenomatous colonic polyps 06/13/2011   Musculoskeletal chest pain 04/23/2011   Radiculopathy of leg 02/02/2011   History of pseudoseizure 07/10/2010   Anxiety and depression 12/29/2009   TINEA PEDIS 06/02/2009   Exertional dyspnea 01/13/2008   MICROSCOPIC HEMATURIA 06/24/2007   WEIGHT LOSS 04/28/2007   Anemia 03/10/2007   Migraine 03/10/2007   ARTHRITIS 03/10/2007   SYNCOPE 03/10/2007    Past Surgical History:  Procedure Laterality Date   ABDOMINAL HYSTERECTOMY  04/06/04   APPENDECTOMY     BILATERAL SALPINGOOPHORECTOMY  04/06/04   CARDIAC EVENT MONITOR  02/2016   Mostly sinus bradycardia and sinus rhythm.  Rare PVCs and PACs.  No arrhythmias.  Symptoms of chest pain and fluttering as well as "passed out spell" noted with normal sinus rhythm.  MASS EXCISION Left 06/02/2015   Procedure: EXCISION LEFT ARM MASS;  Surgeon: Autumn Messing III, MD;  Location: [facility];  Service: General;  Laterality: Left;   NM MYOVIEW LTD  04/2016   Reached heart rate of 127 bpm with Lexiscan.  EF 60 to 65%.  LOW RISK.  No ischemia or infarction.   ROTATOR CUFF REPAIR     TRANSTHORACIC ECHOCARDIOGRAM  01/2016   F 55-6%.  No R WMA.  GR 1 DD.  Normal valves.     OB History   No obstetric history on file.      Family History  Problem Relation Age of Onset   Heart disease Mother    Heart attack Mother    Cancer Father        colon   Hypertension Father    Cancer Sister        brain   Hypertension Sister    Cancer Sister        unsure of type   Diabetes Sister        type 2   Heart attack Sister    Cancer Brother    Hypertension Brother    Colon cancer Neg Hx    Esophageal cancer Neg Hx    Pancreatic cancer Neg Hx    Stomach cancer Neg Hx     Social History   Tobacco Use   Smoking status: Light Smoker    Packs/day: 0.50    Years: 40.00    Pack years: 20.00    Types: Cigarettes    Start date: 12/18/2015   Smokeless tobacco: Never   Tobacco comments:    3-4 cigarettes daily  Vaping Use   Vaping Use: Never used  Substance Use Topics   Alcohol use: Yes    Comment: wine occassioanlly    Drug use: No    Home Medications Prior to Admission medications   Medication Sig Start Date End Date Taking? Authorizing Provider  amLODipine (NORVASC) 5 MG tablet Take 1 tablet (5 mg total) by mouth daily. 03/17/20   Mosie Lukes, MD  Aspirin-Caffeine (BAYER BACK & BODY) 500-32.5 MG TABS Take 1 tablet by mouth 2 (two) times daily as needed (pain).    [provider]  atorvastatin (LIPITOR) 10 MG tablet TAKE 1 TABLET BY MOUTH  DAILY 11/26/17   Mosie Lukes, MD  Butalbital-Acetaminophen 50-300 MG TABS Take 1 tablet by mouth 2 (two) times daily as needed. 01/29/19   Mosie Lukes, MD  colchicine 0.6 MG tablet Take 2 tabs by mouth now, and repeat in 1 hour.  If symptoms are not improved by tomorrow- may repeat same dosing tomorrow 03/29/20   Debbrah Alar, NP  cyclobenzaprine (FLEXERIL) 10 MG tablet Take 1 tablet (10 mg total) by mouth at bedtime. Please dc the 90 day rx with one refill. Only give 30 tabs. 11/07/17   Saguier, Percell Miller, PA-C  dicyclomine (BENTYL) 20 MG tablet Take 1 tablet (20 mg total) by mouth 2 (two) times daily. 08/07/20   Drenda Freeze, MD   esomeprazole (NEXIUM) 40 MG capsule Take 1 capsule (40 mg total) by mouth 2 (two) times daily. Take one tablet 30-60 minutes before breakfast and dinner. 08/29/20   Levin Erp, PA  famciclovir (FAMVIR) 500 MG tablet Take 1 tablet (500 mg total) by mouth 3 (three) times daily. 12/21/19   Saguier, Percell Miller, PA-C  gabapentin (NEURONTIN) 300 MG capsule Take 300 mg by mouth 3 (three) times daily.  [provider]  ibuprofen (ADVIL) 600 MG tablet Take 1 tablet (600 mg total) by mouth every 6 (six) hours as needed for up to 30 doses for mild pain or moderate pain. 04/25/20   Mosie Lukes, MD  metoCLOPramide (REGLAN) 10 MG tablet Take 1 tablet (10 mg total) by mouth 4 (four) times daily. Take one tablet before drinking each prep. 08/29/20   Levin Erp, PA  ondansetron (ZOFRAN ODT) 4 MG disintegrating tablet Take 1 tablet (4 mg total) by mouth every 8 (eight) hours as needed for nausea or vomiting. 08/03/20   Saguier, Percell Miller, PA-C  pantoprazole (PROTONIX) 40 MG tablet Take 1 tablet (40 mg total) by mouth daily. 12/09/17   Mosie Lukes, MD  sucralfate (CARAFATE) 1 g tablet Take 1 tablet (1 g total) by mouth 4 (four) times daily -  with meals and at bedtime. 08/07/20   Drenda Freeze, MD  tiZANidine (ZANAFLEX) 2 MG tablet TAKE 0.5-2 TABLETS (1-4 MG TOTAL) BY MOUTH EVERY 8 (EIGHT) HOURS AS NEEDED FOR MUSCLE SPASMS. 07/03/20   Mosie Lukes, MD    Allergies    Patient has no known allergies.  Review of Systems   Review of Systems  Constitutional:  Positive for fatigue. Negative for chills and fever.  HENT:  Negative for ear pain and sore throat.   Eyes:  Negative for pain and visual disturbance.  Respiratory:  Negative for cough and shortness of breath.   Cardiovascular:  Negative for chest pain and palpitations.  Gastrointestinal:  Negative for abdominal pain and vomiting.  Genitourinary:  Negative for dysuria and hematuria.  Musculoskeletal:  Positive for  arthralgias, myalgias and neck pain. Negative for back pain.  Skin:  Negative for color change and rash.  Neurological:  Positive for tremors, seizures, facial asymmetry, weakness and numbness. Negative for syncope.  All other systems reviewed and are negative.  Physical Exam Updated Vital Signs BP 127/82   Pulse 65   Resp (!) 21   Ht 5\' 5"  (1.651 m)   Wt 47.6 kg   LMP  (LMP Unknown)   SpO2 100%   BMI 17.47 kg/m   Physical Exam Vitals and nursing note reviewed.  Constitutional:      General: She is not in acute distress.    Appearance: She is well-developed.  HENT:     Head: Normocephalic and atraumatic.  Eyes:     Conjunctiva/sclera: Conjunctivae normal.  Cardiovascular:     Rate and Rhythm: Normal rate and regular rhythm.     Heart sounds: No murmur heard. Pulmonary:     Effort: Pulmonary effort is normal. No respiratory distress.     Breath sounds: Normal breath sounds.  Abdominal:     Palpations: Abdomen is soft.     Tenderness: There is no abdominal tenderness.  Musculoskeletal:     Cervical back: Neck supple.     Comments: LUE: no deformity, normal joint ROM, normal radial pulse  Skin:    General: Skin is warm and dry.  Neurological:     Mental Status: She is alert.     Comments: AAOx3, speech slurred, dysarthric CN 2-12 intact except for left facial droop 5/5 strength in RLE and RUE 2/5 strength in LUE and LLE Sensation to light touch intact in b/l UE and LE     ED Results / Procedures / Treatments   Labs (all labs ordered are listed, but only abnormal results are displayed) Labs Reviewed  CBC - Abnormal; Notable for  the following components:      Result Value   RBC 5.24 (*)    All other components within normal limits  COMPREHENSIVE METABOLIC PANEL - Abnormal; Notable for the following components:   Glucose, Bld 110 (*)    BUN 7 (*)    All other components within normal limits  RESP PANEL BY RT-PCR (FLU A&B, COVID) ARPGX2  ETHANOL  PROTIME-INR   APTT  DIFFERENTIAL  RAPID URINE DRUG SCREEN, HOSP PERFORMED  URINALYSIS, ROUTINE W REFLEX MICROSCOPIC  CBG MONITORING, ED    EKG None  Radiology CT HEAD CODE STROKE WO CONTRAST  Result Date: 01/22/2021 CLINICAL DATA:  Code stroke. 60 year old female with neurologic deficit. Left side pain. EXAM: CT HEAD WITHOUT CONTRAST TECHNIQUE: Contiguous axial images were obtained from the base of the skull through the vertex without intravenous contrast. COMPARISON:  Head CT 03/25/2020. FINDINGS: Brain: Cerebral volume remains normal. Mild motion artifact at the skull base. No midline shift, ventriculomegaly, mass effect, evidence of mass lesion, intracranial hemorrhage or evidence of cortically based acute infarction. Gray-white matter differentiation is within normal limits throughout the brain. Vascular: No suspicious intracranial vascular hyperdensity. Skull: Mild motion artifact at the skull base. No acute osseous abnormality identified. Sinuses/Orbits: Visualized paranasal sinuses and mastoids are stable and well aerated. Other: Visualized orbits and scalp soft tissues are within normal limits. ASPECTS [facility] Stroke Program Early CT Score) Total score (0-10 with 10 being normal): 10. IMPRESSION: Stable and normal for age noncontrast CT appearance of the brain when allowing for mild motion artifact today. ASPECTS 10. Electronically Signed   By: Genevie Ann M.D.   On: 01/22/2021 09:30   CT ANGIO HEAD NECK W WO CM (CODE STROKE)  Result Date: 01/22/2021 CLINICAL DATA:  60 year old female code stroke. Left side pain, deficit. EXAM: CT ANGIOGRAPHY HEAD AND NECK TECHNIQUE: Multidetector CT imaging of the head and neck was performed using the standard protocol during bolus administration of intravenous contrast. Multiplanar CT image reconstructions and MIPs were obtained to evaluate the vascular anatomy. Carotid stenosis measurements (when applicable) are obtained utilizing NASCET criteria, using the distal  internal carotid diameter as the denominator. CONTRAST:  142mL OMNIPAQUE IOHEXOL 350 MG/ML SOLN COMPARISON:  Plain head CT today 0925 hours. Carotid Doppler ultrasound 04/15/2015. Chest CT 12/29/2009. Brain MRI 12/09/2003. FINDINGS: CTA NECK Skeleton: Intermittent cervical facet degeneration and hypertrophy, otherwise negative. Upper chest: Centrilobular emphysema. 5 mm right upper lobe lung nodule series 4, image 154, and nodular right apical scarring measuring about 8 mm - both stable since 2011 and benign. Small volume retained secretions in the trachea above the carina. No superior mediastinal lymphadenopathy. Other neck: Small postinflammatory calcification along the right lower palatine or lingual tonsil on series 4, image 98. There is mildly asymmetric tonsillar size and enhancement in the oropharynx - greater on the right (series 4, image 94 and series 7, image 127). Cervical lymph nodes remain within normal limits, there is a maximal right level 2A node measuring 9 mm short axis. Otherwise negative for age neck soft tissues. Aortic arch: 3 vessel arch configuration. Mild aortic arch atherosclerosis. Right carotid system: Negative.  Mildly tortuous cervical right ICA. Left carotid system: Negative left CCA. Mild calcified plaque at the lateral left carotid bifurcation. No stenosis. Asymmetrically smaller left ICA but no stenosis to the skull base. Vertebral arteries: Negative proximal right subclavian artery. Mild calcified plaque near the right vertebral artery origin but no origin stenosis. Tortuous right vertebral V2 segment. Patent right vertebral to the skull  base without additional plaque or stenosis. Mild plaque in the proximal left subclavian artery without stenosis. Calcified plaque at the left vertebral artery origin but only mild origin stenosis on series 7, image 85. Tortuous left V1 and V2 segments. Fairly codominant left vertebral is patent to the skull base without additional plaque or  stenosis. CTA HEAD Posterior circulation: Mildly dominant right vertebral V4 segment. Normal PICA origins, distal vertebral arteries and vertebrobasilar junction. Patent basilar artery without stenosis. Ectatic basilar artery tip (series 10, image 24), 4-5 mm diameter but without a saccular aneurysm component. SCA and PCA origins are patent. Left P1 segment is tortuous and directed superiorly. Posterior communicating arteries are diminutive or absent. Bilateral PCA branches are tortuous but otherwise within normal limits. Anterior circulation: Both ICA siphons are patent. Left siphon is asymmetrically smaller, with distal petrous segment through cavernous segment intermittent plaque but no significant stenosis. A small left posterior communicating artery origin is normal. Larger right ICA siphon is patent with minor calcified plaque and no stenosis. Distal right ICA infundibulum suspected, 2-3 mm. See apparent vessel origin at the apex on series 8, image 73. Patent carotid termini. Dominant right and diminutive or absent left ACA A1 segments and azygous type ACA anatomy, stable since 2005. ACA branches are within normal limits. Left MCA M1 segment and bifurcation are patent without stenosis. Right MCA M1 segment and bifurcation are patent without stenosis. Bilateral MCA branches are within normal limits. Venous sinuses: Patent. Anatomic variants: Azygous ACA anatomy with dominant right, diminutive or absent left A1 segments. Subsequently the right ICA is also mildly dominant. Review of the MIP images confirms the above findings IMPRESSION: 1. Negative for large vessel occlusion. 2. Mild for age atherosclerosis in the head and neck. No significant arterial stenosis identified. Ectatic basilar artery tip, 4-5 mm diameter. But No saccular aneurysm component. Also, 2-3 mm infundibulum of the distal Right ICA (normal variant). 3. Mild asymmetry of the tonsillar size and enhancement - greater on the right - with a maximal  right level 2A lymph node. Consider follow-up with ENT for direct inspection. 4. Aortic Atherosclerosis (ICD10-I70.0) and Emphysema (ICD10-J43.9). Right upper lobe lung nodules are stable since 2011 and benign. Electronically Signed   By: Genevie Ann M.D.   On: 01/22/2021 09:51    Procedures Procedures   Medications Ordered in ED Medications  LORazepam (ATIVAN) injection 1 mg (1 mg Intravenous Given 01/22/21 0926)  levETIRAcetam (KEPPRA) IVPB 1000 mg/100 mL premix (0 mg Intravenous Stopped 01/22/21 0958)  iohexol (OMNIPAQUE) 350 MG/ML injection 100 mL (100 mLs Intravenous Contrast Given 01/22/21 0947)    ED Course  I have reviewed the triage vital signs and the nursing notes.  Pertinent labs & imaging results that were available during my care of the patient were reviewed by me and considered in my medical decision making (see chart for details).    MDM Rules/Calculators/A&P                          60 year old lady presents to ER with concern for left facial droop, left-sided weakness.  On my evaluation, patient appeared to have left facial droop and was weak in left arm and left leg.  Proceeded with stroke alert.  Patient had episode of seizure-like activity while I was in the room.  No bladder or bowel incontinence, no prolonged postictal state.  CT head and CTA negative for acute pathology.  Patient has been extensive prior history of pseudoseizures.  Teleneurology feels that symptoms today most likely conversion disorder and nonepileptic seizures.  She recommends no further neurologic work-up or neurologic interventions.  Patient was allowed to rest and observed in ER for a couple hours.  She had complete resolution of her neurologic complaints.  On reassessment strength is intact in both left upper and lower extremities, facial droop has completely resolved.  No further seizure-like episodes.  Will discharge home with husband, recommend that he follow-up with primary care.  CT scan noted  incidental finding of asymmetric tonsils, provided ENT information for outpatient follow-up.    After the discussed management above, the patient was determined to be safe for discharge.  The patient was in agreement with this plan and all questions regarding their care were answered.  ED return precautions were discussed and the patient will return to the ED with any significant worsening of condition.  Final Clinical Impression(s) / ED Diagnoses Final diagnoses:  Seizure-like activity ([place])  Stroke-like symptoms    Rx / DC Orders ED Discharge Orders     None        Lucrezia Starch, MD 01/22/21 1148

## 2021-01-22 NOTE — ED Notes (Signed)
Patient transported to CT 

## 2021-01-23 LAB — ETHANOL: Alcohol, Ethyl (B): <10 mg/dL (ref <10)

## 2021-01-31 ENCOUNTER — Other Ambulatory Visit: Payer: Self-pay

## 2021-01-31 ENCOUNTER — Encounter: Payer: Self-pay | Admitting: Family Medicine

## 2021-01-31 ENCOUNTER — Ambulatory Visit (INDEPENDENT_AMBULATORY_CARE_PROVIDER_SITE_OTHER): Payer: 59 | Admitting: Family Medicine

## 2021-01-31 ENCOUNTER — Ambulatory Visit (HOSPITAL_BASED_OUTPATIENT_CLINIC_OR_DEPARTMENT_OTHER)
Admission: RE | Admit: 2021-01-31 | Discharge: 2021-01-31 | Disposition: A | Discharge status: Home / Self Care | Payer: 59 | Source: Ambulatory Visit | Attending: Family Medicine | Admitting: Family Medicine

## 2021-01-31 VITALS — BP 92/60 | HR 61 | Temp 98.1°F | Resp 16 | Wt 102.2 lb

## 2021-01-31 DIAGNOSIS — M542 Cervicalgia: Secondary | ICD-10-CM | POA: Diagnosis present

## 2021-01-31 DIAGNOSIS — R002 Palpitations: Secondary | ICD-10-CM | POA: Diagnosis not present

## 2021-01-31 DIAGNOSIS — I10 Essential (primary) hypertension: Secondary | ICD-10-CM

## 2021-01-31 DIAGNOSIS — R531 Weakness: Secondary | ICD-10-CM

## 2021-01-31 DIAGNOSIS — R569 Unspecified convulsions: Secondary | ICD-10-CM | POA: Diagnosis not present

## 2021-01-31 DIAGNOSIS — R739 Hyperglycemia, unspecified: Secondary | ICD-10-CM

## 2021-01-31 DIAGNOSIS — G40909 Epilepsy, unspecified, not intractable, without status epilepticus: Secondary | ICD-10-CM

## 2021-01-31 DIAGNOSIS — R0789 Other chest pain: Secondary | ICD-10-CM

## 2021-01-31 DIAGNOSIS — M25512 Pain in left shoulder: Secondary | ICD-10-CM

## 2021-01-31 DIAGNOSIS — E782 Mixed hyperlipidemia: Secondary | ICD-10-CM

## 2021-01-31 DIAGNOSIS — R350 Frequency of micturition: Secondary | ICD-10-CM

## 2021-01-31 DIAGNOSIS — M109 Gout, unspecified: Secondary | ICD-10-CM | POA: Insufficient documentation

## 2021-01-31 MED ORDER — ATORVASTATIN CALCIUM 10 MG PO TABS: 10.0000 mg | ORAL_TABLET | Freq: Every day | ORAL | 1 refills | Status: DC

## 2021-01-31 MED ORDER — BUTALBITAL-ACETAMINOPHEN 50-300 MG PO TABS: 1.0000 | ORAL_TABLET | Freq: Two times a day (BID) | ORAL | 1 refills | Status: DC | PRN

## 2021-01-31 NOTE — Assessment & Plan Note (Signed)
hgba1c acceptable, minimize simple carbs. Increase exercise as tolerated.  

## 2021-01-31 NOTE — Patient Instructions (Signed)
Non-Epileptic Seizures, Adult A non-epileptic seizure is an event that can cause abnormal movements or a loss of consciousness. These events differ from epileptic seizures because they are not caused by abnormal electrical and chemical activity in the brain. There are two types of non-epileptic seizures: Physiologic. This type results from an underlying problem with body function. Psychogenic. This type results from an underlying mental health disorder. What are the causes? The cause of this condition depends on the kind of non-epileptic seizure that you have. Causes of physiologic non-epileptic seizures Sudden drop in blood pressure or heart rhythm problems. Low blood sugar (glucose) or low levels of salt (sodium) in your blood. Migraine. Sleep disorders or movement disorders. Certain medicines. Heavy use of drugs or alcohol. Head injuries. Causes of psychogenic non-epileptic seizures Stress. Emotional trauma. Sexual or physical abuse. Big life events, such as divorce or death of a loved one. Mental health disorders, including anxiety and depression. What are the signs or symptoms? Symptoms of a non-epileptic seizure can be similar to those of an epileptic seizure. They may include: A change in attention or behavior. A loss of consciousness or fainting. Uncontrollable shaking (convulsions) with fast, jerking movements. Drooling, tongue biting, or grunting. Rapid eye movements. Being unable to control when you urinate or have bowel movements. After a non-epileptic seizure, you may: Have a headache or sore muscles. Feel confused or sleepy. Non-epileptic seizures usually: Do not cause physical injuries. Start slowly. Include crying or shrieking. Last longer than 2 minutes. Include pelvic thrusting. How is this diagnosed? Non-epileptic seizures may be diagnosed by medical history, physical exam, and symptoms. Your health care provider may: Talk with your friends or relatives who  have seen you have a seizure. Ask you to write down your seizure activity and the things that led up to the seizure, and ask you to share that information with him or her. You may also have tests to look for causes of physiologic non-epileptic seizures. Tests may include: An electroencephalogram (EEG) to check the electrical activity in your brain. Video EEG in the hospital. This takes 2-7 days. Blood tests. An electrocardiogram (ECG) to check for an abnormal heart rhythm. A CT scan. If your health care provider thinks you have had a psychogenic non-epileptic seizure, you may need to be evaluated by a mental health specialist. How is this treated? The treatment for your non-epileptic seizures will depend on their cause. When the underlying condition is treated, your non-epileptic seizures should stop. Medicines to treat seizures do not help with non-epileptic events. If your non-epileptic seizures are being caused by emotional trauma or stress, your health care provider may recommend that you see a mental health specialist. Treatment may include: Relaxation therapy or cognitive behavioral therapy (CBT). Medicines for depression or anxiety. Individual or family counseling. Some people have both psychogenic non-epileptic seizures and epileptic seizures. If you have both of these types, you may be prescribed medicine to manage the epileptic seizures. Follow these instructions at home: Home care will depend on the type of non-epileptic seizures that you have. Follow this general guidance: Avoid alcohol and drug use Avoid using any substance that may prevent your medicine from working properly. If you are prescribed medicine for seizures: Do not use drugs. Limit or avoid drinking alcohol. If you drink alcohol: Limit how much you have to: 0-1 drink a day for women who are not pregnant. 0-2 drinks a day for men. Know how much alcohol is in your drink. In the [place] one drink equals one  12 oz bottle  of beer (355 mL), one 5 oz glass of wine (148 mL), or one 1 oz glass of hard liquor (44 mL). Involve family, friends, and coworkers Make sure family members, friends, and coworkers are trained in how to help you if you have a seizure. If you have a seizure, they should: Lay you on the ground to prevent a fall. Place a pillow or piece of clothing under your head. Loosen any clothing around your neck. Turn you onto your side. If you vomit, this position will help keep your windpipe clear. General instructions Follow all instructions from your health care provider. These may include ways to prevent seizures and what to do if you have a seizure. Take over-the-counter and prescription medicines only as told by your health care provider. Keep all follow-up visits. This is important. Contact a health care provider if: Your non-epileptic seizures change or happen more often. Your non-epileptic seizures do not stop after treatment. Get help right away if: You injure yourself during a non-epileptic seizure. You have one non-epileptic seizure after another. You have trouble recovering from a non-epileptic seizure. You have chest pain or trouble breathing. You have a non-epileptic seizure that lasts longer than 5 minutes. These symptoms may represent a serious problem that is an emergency. Do not wait to see if the symptoms will go away. Get medical help right away. Call your local emergency services (911 in the [place] Do not drive yourself to the hospital. If you ever feel like you may hurt yourself or others, or have thoughts about taking your own life, get help right away. Go to your nearest emergency department or: Call your local emergency services (911 in the [place] Call a suicide crisis helpline, such as the [place] at 713-824-2324 or 988 in the [place]. This is open 24 hours a day in the [place] Text the Crisis Text Line at 414-395-8361 (in the [place].). Summary Non-epileptic  seizures are events that can cause abnormal movements or a loss of consciousness. These events are caused by an underlying problem with body function or a mental health disorder. The treatment for your non-epileptic seizures will depend on their cause. When the underlying condition is treated, your non-epileptic seizures should stop. Medicines for seizures do not treat non-epileptic events. People with non-epileptic seizures may also have epileptic seizures and may need medicines to treat seizures. Make sure family members, friends, and coworkers are trained in how to help you if you have a non-epileptic seizure. This includes laying you on the ground to prevent a fall, protecting your head and neck, and turning you onto your side. This information is not intended to replace advice given to you by your health care provider. Make sure you discuss any questions you have with your health care provider. Document Revised: 09/28/2020 Document Reviewed: 08/21/2019 Elsevier Patient Education  [place].

## 2021-01-31 NOTE — Assessment & Plan Note (Signed)
Well controlled, no changes to meds. Encouraged heart healthy diet such as the DASH diet and exercise as tolerated.  °

## 2021-01-31 NOTE — Assessment & Plan Note (Signed)
Hydrate and monitor 

## 2021-01-31 NOTE — Assessment & Plan Note (Signed)
Check an xray of left shoulder and referred to Sports Medicine for further evaluation

## 2021-01-31 NOTE — Assessment & Plan Note (Signed)
She has been having episodes of her heart racing and notes some episodes of left lower chest/upper abdomen at the same time. She is referred to cardiology for further evaluation

## 2021-01-31 NOTE — Assessment & Plan Note (Signed)
She presented to the ER last week with Left sided headache and was worked up but no evidence of stroke was found. She does report some left sided persistent weakness in the arm and leg. Referred back to neurology for further consideration.

## 2021-01-31 NOTE — Progress Notes (Signed)
Patient ID: Ruth Bell, female    DOB: 12/23/1960  Age: 60 y.o. MRN: [id number]    Subjective:   Chief Complaint  Patient presents with   Hospitalization Follow-up    Left leg pain and fatigue   Subjective   HPI SHAIVI ROTHSCHILD presents for office visit today for follow up on recent ER visit and HTN. Before going to the ER on Sunday morning her husband noticed that her mouth was twisted. Later in the day she started experiencing left arm and left leg weakness with pain which prompted them to go to the ER. She also started feeling anxious in the ER and that is when she experienced a seizure. Husband describes her seizures as total body tremor that last for 3-5 minutes and takes her about 5 minutes for her to recover. Denies any incontinence during episode. Denies HA/congestion/fevers/GI or GU c/o. Taking meds as prescribed.  Besides episodes of seizures, she has been dealing with left sided arm,neck, and leg pain with weakness. Her husband notes that she works a very physically demanding job requiring her to carry heavy loads. The arm and leg pain affects her sleep and sometimes wakes her up. She states that her left foot was swollen too while at the ER. When she is physically active, she sometimes experiences tachycardia, SOB and CP.  She has been dealing with abdominal pain that started about a month ago that is currently slowly improving. She is scheduled to see ENT Dr. Fredric Dine for recent abnormality found on neck CT scan.   Review of Systems  Constitutional:  Positive for fatigue. Negative for chills and fever.  HENT:  Negative for congestion, rhinorrhea, sinus pressure, sinus pain, sore throat and trouble swallowing.   Eyes:  Negative for pain.  Respiratory:  Positive for shortness of breath. Negative for cough.   Cardiovascular:  Positive for chest pain and palpitations. Negative for leg swelling.  Gastrointestinal:  Positive for abdominal pain. Negative for blood in stool,  diarrhea, nausea and vomiting.  Genitourinary:  Negative for decreased urine volume, flank pain, frequency, vaginal bleeding and vaginal discharge.  Musculoskeletal:  Positive for myalgias. Negative for back pain.  Neurological:  Positive for weakness. Negative for headaches.  Psychiatric/Behavioral:  The patient is nervous/anxious.    History Past Medical History:  Diagnosis Date   Anemia    Anxiety    Arm skin lesion, left 09/05/2014   Constipation 12/27/2013   Depression    Dysphagia 04/15/2016   Eczema 08/22/2015   GERD (gastroesophageal reflux disease)    Hematuria 03/04/2016   History of shingles 01/22/2016   Hyperglycemia 01/15/2015   Hyperlipidemia, mixed 08/22/2015   Hypertension    Menometrorrhagia 2006   Migraine, unspecified, without mention of intractable migraine without mention of status migrainosus    Preventative health care 11/04/2016   Pseudoseizures ([place])    Seizures ([place])    history of pseudoseizure disorder, last last seizure 3 wks, keppra inc in dosage   Tubular adenoma of colon 05/2011   Uterine leiomyoma 2006    She has a past surgical history that includes Appendectomy; Bilateral salpingoophorectomy (04/06/04); Abdominal hysterectomy (04/06/04); Rotator cuff repair; Mass excision (Left, 06/02/2015); NM MYOVIEW LTD (04/2016); transthoracic echocardiogram (01/2016); and CARDIAC EVENT MONITOR (02/2016).   Her family history includes Cancer in her brother, father, sister, and sister; Diabetes in her sister; Heart attack in her mother and sister; Heart disease in her mother; Hypertension in her brother, father, and sister.She reports that she has been smoking  cigarettes. She started smoking about 5 years ago. She has a 20.00 pack-year smoking history. She has never used smokeless tobacco. She reports current alcohol use. She reports that she does not use drugs.  Current Outpatient Medications on File Prior to Visit  Medication Sig Dispense Refill   Aspirin-Caffeine  (BAYER BACK & BODY) 500-32.5 MG TABS Take 1 tablet by mouth 2 (two) times daily as needed (pain).     colchicine 0.6 MG tablet Take 2 tabs by mouth now, and repeat in 1 hour.  If symptoms are not improved by tomorrow- may repeat same dosing tomorrow 6 tablet 0   dicyclomine (BENTYL) 20 MG tablet Take 1 tablet (20 mg total) by mouth 2 (two) times daily. 20 tablet 0   esomeprazole (NEXIUM) 40 MG capsule Take 1 capsule (40 mg total) by mouth 2 (two) times daily. Take one tablet 30-60 minutes before breakfast and dinner. 60 capsule 2   gabapentin (NEURONTIN) 300 MG capsule Take 300 mg by mouth 3 (three) times daily.     ibuprofen (ADVIL) 600 MG tablet Take 1 tablet (600 mg total) by mouth every 6 (six) hours as needed for up to 30 doses for mild pain or moderate pain. 30 tablet 0   metoCLOPramide (REGLAN) 10 MG tablet Take 1 tablet (10 mg total) by mouth 4 (four) times daily. Take one tablet before drinking each prep. 2 tablet 0   ondansetron (ZOFRAN ODT) 4 MG disintegrating tablet Take 1 tablet (4 mg total) by mouth every 8 (eight) hours as needed for nausea or vomiting. 20 tablet 0   sucralfate (CARAFATE) 1 g tablet Take 1 tablet (1 g total) by mouth 4 (four) times daily -  with meals and at bedtime. 30 tablet 0   No current facility-administered medications on file prior to visit.     Objective:  Objective  Physical Exam Constitutional:      General: She is not in acute distress.    Appearance: Normal appearance. She is not ill-appearing or toxic-appearing.  HENT:     Head: Normocephalic and atraumatic.     Right Ear: Tympanic membrane, ear canal and external ear normal.     Left Ear: Tympanic membrane, ear canal and external ear normal.     Nose: No congestion or rhinorrhea.  Eyes:     Extraocular Movements: Extraocular movements intact.     Pupils: Pupils are equal, round, and reactive to light.  Cardiovascular:     Rate and Rhythm: Normal rate and regular rhythm.     Pulses: Normal  pulses.     Heart sounds: Normal heart sounds. No murmur heard. Pulmonary:     Effort: Pulmonary effort is normal. No respiratory distress.     Breath sounds: Normal breath sounds. No wheezing, rhonchi or rales.  Abdominal:     General: Bowel sounds are normal.     Palpations: Abdomen is soft. There is no mass.     Tenderness: There is no abdominal tenderness. There is no guarding.     Hernia: No hernia is present.  Musculoskeletal:        General: Normal range of motion.     Cervical back: Normal range of motion and neck supple.  Skin:    General: Skin is warm and dry.  Neurological:     Mental Status: She is alert and oriented to person, place, and time.  Psychiatric:        Behavior: Behavior normal.   BP 92/60   Pulse 61  Temp 98.1 F (36.7 C)   Resp 16   Wt 102 lb 3.2 oz (46.4 kg)   LMP  (LMP Unknown)   SpO2 99%   BMI 17.01 kg/m  Wt Readings from Last 3 Encounters:  01/31/21 102 lb 3.2 oz (46.4 kg)  01/22/21 105 lb (47.6 kg)  09/02/20 104 lb (47.2 kg)     Lab Results  Component Value Date   WBC 8.5 01/22/2021   HGB 14.6 01/22/2021   HCT 44.4 01/22/2021   PLT 346 01/22/2021   GLUCOSE 110 (H) 01/22/2021   CHOL 220 (H) 12/21/2019   TRIG 86 12/21/2019   HDL 68 12/21/2019   LDLCALC 133 (H) 12/21/2019   ALT 16 01/22/2021   AST 24 01/22/2021   NA 141 01/22/2021   K 3.5 01/22/2021   CL 105 01/22/2021   CREATININE 0.63 01/22/2021   BUN 7 (L) 01/22/2021   CO2 22 01/22/2021   TSH 0.77 02/05/2019   INR 1.0 01/22/2021   HGBA1C 6.5 02/05/2019    CT HEAD CODE STROKE WO CONTRAST  Result Date: 01/22/2021 CLINICAL DATA:  Code stroke. 60 year old female with neurologic deficit. Left side pain. EXAM: CT HEAD WITHOUT CONTRAST TECHNIQUE: Contiguous axial images were obtained from the base of the skull through the vertex without intravenous contrast. COMPARISON:  Head CT 03/25/2020. FINDINGS: Brain: Cerebral volume remains normal. Mild motion artifact at the skull base.  No midline shift, ventriculomegaly, mass effect, evidence of mass lesion, intracranial hemorrhage or evidence of cortically based acute infarction. Gray-white matter differentiation is within normal limits throughout the brain. Vascular: No suspicious intracranial vascular hyperdensity. Skull: Mild motion artifact at the skull base. No acute osseous abnormality identified. Sinuses/Orbits: Visualized paranasal sinuses and mastoids are stable and well aerated. Other: Visualized orbits and scalp soft tissues are within normal limits. ASPECTS [facility] Stroke Program Early CT Score) Total score (0-10 with 10 being normal): 10. IMPRESSION: Stable and normal for age noncontrast CT appearance of the brain when allowing for mild motion artifact today. ASPECTS 10. Electronically Signed   By: Genevie Ann M.D.   On: 01/22/2021 09:30   CT ANGIO HEAD NECK W WO CM (CODE STROKE)  Result Date: 01/22/2021 CLINICAL DATA:  60 year old female code stroke. Left side pain, deficit. EXAM: CT ANGIOGRAPHY HEAD AND NECK TECHNIQUE: Multidetector CT imaging of the head and neck was performed using the standard protocol during bolus administration of intravenous contrast. Multiplanar CT image reconstructions and MIPs were obtained to evaluate the vascular anatomy. Carotid stenosis measurements (when applicable) are obtained utilizing NASCET criteria, using the distal internal carotid diameter as the denominator. CONTRAST:  190mL OMNIPAQUE IOHEXOL 350 MG/ML SOLN COMPARISON:  Plain head CT today 0925 hours. Carotid Doppler ultrasound 04/15/2015. Chest CT 12/29/2009. Brain MRI 12/09/2003. FINDINGS: CTA NECK Skeleton: Intermittent cervical facet degeneration and hypertrophy, otherwise negative. Upper chest: Centrilobular emphysema. 5 mm right upper lobe lung nodule series 4, image 154, and nodular right apical scarring measuring about 8 mm - both stable since 2011 and benign. Small volume retained secretions in the trachea above the carina. No  superior mediastinal lymphadenopathy. Other neck: Small postinflammatory calcification along the right lower palatine or lingual tonsil on series 4, image 98. There is mildly asymmetric tonsillar size and enhancement in the oropharynx - greater on the right (series 4, image 94 and series 7, image 127). Cervical lymph nodes remain within normal limits, there is a maximal right level 2A node measuring 9 mm short axis. Otherwise negative for age neck soft  tissues. Aortic arch: 3 vessel arch configuration. Mild aortic arch atherosclerosis. Right carotid system: Negative.  Mildly tortuous cervical right ICA. Left carotid system: Negative left CCA. Mild calcified plaque at the lateral left carotid bifurcation. No stenosis. Asymmetrically smaller left ICA but no stenosis to the skull base. Vertebral arteries: Negative proximal right subclavian artery. Mild calcified plaque near the right vertebral artery origin but no origin stenosis. Tortuous right vertebral V2 segment. Patent right vertebral to the skull base without additional plaque or stenosis. Mild plaque in the proximal left subclavian artery without stenosis. Calcified plaque at the left vertebral artery origin but only mild origin stenosis on series 7, image 85. Tortuous left V1 and V2 segments. Fairly codominant left vertebral is patent to the skull base without additional plaque or stenosis. CTA HEAD Posterior circulation: Mildly dominant right vertebral V4 segment. Normal PICA origins, distal vertebral arteries and vertebrobasilar junction. Patent basilar artery without stenosis. Ectatic basilar artery tip (series 10, image 24), 4-5 mm diameter but without a saccular aneurysm component. SCA and PCA origins are patent. Left P1 segment is tortuous and directed superiorly. Posterior communicating arteries are diminutive or absent. Bilateral PCA branches are tortuous but otherwise within normal limits. Anterior circulation: Both ICA siphons are patent. Left siphon  is asymmetrically smaller, with distal petrous segment through cavernous segment intermittent plaque but no significant stenosis. A small left posterior communicating artery origin is normal. Larger right ICA siphon is patent with minor calcified plaque and no stenosis. Distal right ICA infundibulum suspected, 2-3 mm. See apparent vessel origin at the apex on series 8, image 73. Patent carotid termini. Dominant right and diminutive or absent left ACA A1 segments and azygous type ACA anatomy, stable since 2005. ACA branches are within normal limits. Left MCA M1 segment and bifurcation are patent without stenosis. Right MCA M1 segment and bifurcation are patent without stenosis. Bilateral MCA branches are within normal limits. Venous sinuses: Patent. Anatomic variants: Azygous ACA anatomy with dominant right, diminutive or absent left A1 segments. Subsequently the right ICA is also mildly dominant. Review of the MIP images confirms the above findings IMPRESSION: 1. Negative for large vessel occlusion. 2. Mild for age atherosclerosis in the head and neck. No significant arterial stenosis identified. Ectatic basilar artery tip, 4-5 mm diameter. But No saccular aneurysm component. Also, 2-3 mm infundibulum of the distal Right ICA (normal variant). 3. Mild asymmetry of the tonsillar size and enhancement - greater on the right - with a maximal right level 2A lymph node. Consider follow-up with ENT for direct inspection. 4. Aortic Atherosclerosis (ICD10-I70.0) and Emphysema (ICD10-J43.9). Right upper lobe lung nodules are stable since 2011 and benign. Electronically Signed   By: Genevie Ann M.D.   On: 01/22/2021 09:51     Assessment & Plan:  Plan    Meds ordered this encounter  Medications   atorvastatin (LIPITOR) 10 MG tablet    Sig: Take 1 tablet (10 mg total) by mouth daily.    Dispense:  90 tablet    Refill:  1   Butalbital-Acetaminophen 50-300 MG TABS    Sig: Take 1 tablet by mouth 2 (two) times daily as  needed.    Dispense:  60 tablet    Refill:  1     Problem List Items Addressed This Visit     Essential hypertension (Chronic)    Well controlled, no changes to meds. Encouraged heart healthy diet such as the DASH diet and exercise as tolerated.  Relevant Medications   atorvastatin (LIPITOR) 10 MG tablet   Other Relevant Orders   TSH   Seizure disorder ([place]) (Chronic)    Has a long history of seizure like activity off and on especially during times of stress. She had two episodes of generalized seizure type activity while she was stressed in the hospital. Her husband reports she was confused for about 5-10 minutes after each episode but no confusion. Will refer to neurology again for further consideration and check labs today      Palpitations - Primary (Chronic)    She has been having episodes of her heart racing and notes some episodes of left lower chest/upper abdomen at the same time. She is referred to cardiology for further evaluation      Relevant Orders   Ambulatory referral to Cardiology   Shoulder pain    Check an xray of left shoulder and referred to Sports Medicine for further evaluation      Relevant Orders   Ambulatory referral to Sports Medicine   DG Shoulder Left   Hyperglycemia    hgba1c acceptable, minimize simple carbs. Increase exercise as tolerated.       Relevant Orders   Hemoglobin A1c   Hyperlipidemia, mixed    Encourage heart healthy diet such as MIND or DASH diet, increase exercise, avoid trans fats, simple carbohydrates and processed foods, consider a krill or fish or flaxseed oil cap daily.       Relevant Medications   atorvastatin (LIPITOR) 10 MG tablet   Other Relevant Orders   Lipid panel   Atypical chest pain   Relevant Orders   Ambulatory referral to Cardiology   Urinary frequency    Check UA and urine culture      Relevant Orders   Urinalysis   Urine Culture   Left-sided weakness    She presented to the ER last week with  Left sided headache and was worked up but no evidence of stroke was found. She does report some left sided persistent weakness in the arm and leg. Referred back to neurology for further consideration.      Relevant Orders   Ambulatory referral to Neurology   Ambulatory referral to Sports Medicine   DG Shoulder Left   Gout    Hydrate and monitor      Relevant Orders   Uric acid   Other Visit Diagnoses     Seizure [facility])       Relevant Orders   Ambulatory referral to Neurology       Follow-up: Return for f/u vv or in-person in 2-3 months around (05/03/2021).  I, Suezanne Jacquet, acting as a scribe for [name], MD, have documented all relevent documentation on behalf of [name], MD, as directed by [name], MD while in the presence of [name], MD. DO:01/31/21.  I, Mosie Lukes, MD personally performed the services described in this documentation. All medical record entries made by the scribe were at my direction and in my presence. I have reviewed the chart and agree that the record reflects my personal performance and is accurate and complete

## 2021-01-31 NOTE — Assessment & Plan Note (Signed)
Has a long history of seizure like activity off and on especially during times of stress. She had two episodes of generalized seizure type activity while she was stressed in the hospital. Her husband reports she was confused for about 5-10 minutes after each episode but no confusion. Will refer to neurology again for further consideration and check labs today

## 2021-01-31 NOTE — Assessment & Plan Note (Signed)
Encourage heart healthy diet such as MIND or DASH diet, increase exercise, avoid trans fats, simple carbohydrates and processed foods, consider a krill or fish or flaxseed oil cap daily.  °

## 2021-01-31 NOTE — Assessment & Plan Note (Signed)
Check UA and urine culture.

## 2021-02-01 ENCOUNTER — Telehealth: Payer: Self-pay

## 2021-02-01 LAB — URINE CULTURE
MICRO NUMBER:: 12639136
Result:: NO GROWTH
SPECIMEN QUALITY:: ADEQUATE

## 2021-02-01 NOTE — Telephone Encounter (Signed)
Started prior [place]. For butalbital-acetaminophen 50-300 mg Key: [id number]  Awaiting response

## 2021-02-02 ENCOUNTER — Other Ambulatory Visit: Payer: Self-pay

## 2021-02-02 ENCOUNTER — Other Ambulatory Visit: Payer: Self-pay | Admitting: Family Medicine

## 2021-02-02 ENCOUNTER — Ambulatory Visit (HOSPITAL_BASED_OUTPATIENT_CLINIC_OR_DEPARTMENT_OTHER)
Admission: RE | Admit: 2021-02-02 | Discharge: 2021-02-02 | Disposition: A | Discharge status: Home / Self Care | Payer: 59 | Source: Ambulatory Visit | Attending: Family Medicine | Admitting: Family Medicine

## 2021-02-02 DIAGNOSIS — R52 Pain, unspecified: Secondary | ICD-10-CM

## 2021-02-02 DIAGNOSIS — M25512 Pain in left shoulder: Secondary | ICD-10-CM | POA: Insufficient documentation

## 2021-02-02 DIAGNOSIS — R531 Weakness: Secondary | ICD-10-CM | POA: Insufficient documentation

## 2021-02-02 MED ORDER — MELOXICAM 15 MG PO TABS: 15.0000 mg | ORAL_TABLET | Freq: Every day | ORAL | 1 refills | Status: DC

## 2021-02-20 ENCOUNTER — Telehealth: Payer: Self-pay | Admitting: Family Medicine

## 2021-02-20 NOTE — Telephone Encounter (Signed)
Patient's husband is calling in regards of an ENT appointment he believes his wife was supposed to have. He stated that a referral had been ordered last month for an ENT and they thought they had an appointment today. They were informed that an ENT referral was not put in last month and if they needed one a message would be sent to [facility]. He stated he wasn't sure if they needed one or not, and that his wife had received a letter about an ENT appointment. He wants to know if they need an ENT appointment or not. Please advice.

## 2021-02-20 NOTE — Telephone Encounter (Signed)
Spoke with husband and he is aware that there was not referral for ENT made

## 2021-02-22 NOTE — Telephone Encounter (Signed)
Insurance will cover  Butalbital-acetaminophen tablet 50-325mg  Butalbital-acetaminophen-caff capsule 50-300-40mg  Butalbital-acetaminophen-caff capsuel 50-325-40mg  Zebutal capsule 50-325-40mg 

## 2021-02-23 ENCOUNTER — Other Ambulatory Visit: Payer: Self-pay

## 2021-02-23 MED ORDER — BUTALBITAL-APAP-CAFFEINE 50-325-40 MG PO TABS: 1.0000 | ORAL_TABLET | Freq: Two times a day (BID) | ORAL | 0 refills | Status: DC

## 2021-02-23 MED ORDER — BUTALBITAL-APAP-CAFFEINE 50-325-40 MG PO TABS: 1.0000 | ORAL_TABLET | Freq: Four times a day (QID) | ORAL | 0 refills | Status: DC | PRN

## 2021-03-17 ENCOUNTER — Ambulatory Visit (INDEPENDENT_AMBULATORY_CARE_PROVIDER_SITE_OTHER): Payer: 59 | Admitting: Neurology

## 2021-03-17 ENCOUNTER — Other Ambulatory Visit: Payer: Self-pay

## 2021-03-17 ENCOUNTER — Encounter: Payer: Self-pay | Admitting: Neurology

## 2021-03-17 VITALS — BP 133/85 | HR 66 | Ht 62.0 in | Wt 109.4 lb

## 2021-03-17 DIAGNOSIS — R519 Headache, unspecified: Secondary | ICD-10-CM

## 2021-03-17 DIAGNOSIS — R531 Weakness: Secondary | ICD-10-CM | POA: Diagnosis not present

## 2021-03-17 DIAGNOSIS — F445 Conversion disorder with seizures or convulsions: Secondary | ICD-10-CM | POA: Diagnosis not present

## 2021-03-17 NOTE — Progress Notes (Signed)
NEUROLOGY FOLLOW UP OFFICE NOTE  Ruth Bell [id number] [birth year]  HISTORY OF PRESENT ILLNESS: I had the pleasure of seeing Ruth Bell in follow-up in the neurology clinic on 03/17/2021.  The patient was last seen on almost a year ago for psychogenic non-epileptic events. She is again accompanied by her husband who helps supplement the history today.  Records and images were personally reviewed where available.  She presents for evaluation of an episode of left-sided weakness last 01/22/21. Her husband reports that she had worked on Saturday night and when she got home went to bed feeling fine. When she woke up and looked at the mirror, her mouth was twisted to the left and her left arm and leg were weak. Her husband witnessed this as well, and states that this was a new symptom, he had not seen her face twisted like this with her psychogenic seizures in the past, which he is well aware of. She was brought to the ER where exam noted 2/5 left UE and LE. She had a seizure-like episode and was evaluated by teleneurology, diagnosed with conversion disorder. On reassessment, facial droop had completely resolved and strength was intact. Head CT did not show any acute changes. CTA head showed mild atherosclerosis, no significant stenosis. There was an ectatic basilar artery tip. Her husband notes that while she was in the ER, "they grabbed her arm up which caused pain, that is when she had the seizure." He notes "pain generates a seizure." He notes it took 2 days for her facial weakness to improve, she denies any difficulty swallowing. She has occasional tingling in her face. She reports body pains are off and on, there are pain-free days. She has pain in the left side of her neck going to the left side of her head and behind the left eye. There is soreness on the vertex. Her husband notes these have been going on since her left eye was damaged 4-5 years ago. Sometimes it feels like her left leg is on fire,  with pain shooting down her buttock. She has some constipation and took Dulcolax a couple of days ago.   History on Initial Assessment 03/23/2019: This is a 60 year old right-handed woman with a history of hypertension, hyperlipidemia, anxiety, depression, migraines, psychogenic non-epileptic events, presenting for evaluation of seizures and headaches. She denies any prior history of migraines however records from her prior neurologist Dr. Sabra Heck indicate a history of headaches. She states headaches started 6 months ago with pain in the vertex and lateralizing to the left side and left eye. If she pulls her hair in the middle, it takes pressure/tension off. Headaches occur once a week, with nausea/vomiting, sensitivity to lights and sounds. Ibuprofen has not helped. She reports seizures started at age 1, she used to have them daily, sometimes 3-4 times a day. She reports her heart stopped due to a seizure at one point. Records from Dr. Sabra Heck indicate that she had an EEG in 2011 with provocation-induced body and head jerking without EEG correlate. In 2012, episodes of staring and slumping down followed by generalized shaking and gagging were reported and felt to be psychogenic in the ER. In 2014, she was taking Dilantin and had an EEG capturing shaking with no EEG change. She was switched to [place] in 2016. There is an EEG in 2017 with note of mild slowing in the left temporal and left frontal region. "Seizure cream" was used and she started feeling scared then had generalized shaking  that at times could be stopped with the use of the "anti-seizure cream." At that time she was reporting right arm then head jerking. She had been event-free for 4 years and was off Remer, until she had a fall in September 2020 and fractured her left wrist. She states seizures started after this. Her husband describes them as "pain-induced seizures." He states the seizures look like her prior seizures where her whole body starts  jerking then she passes out. No nocturnal seizures. She states the seizures are different now, not as hard as prior seizures, starting as a nagging headache in the middle of her forehead, she can see her hand jumping and knows it is about to happen, then loses consciousness. She would be unaware of her surroundings. No tongue bite or incontinence. She has fallen to the ground. She reports Keppra 500mg  BID was restarted 2 weeks ago and she has not had any seizures in 3 weeks. No side effects. Due to the pain, she has not been sleeping well, awake at 3 or 4AM due to left arm pain. In the past she took Quetiapine for sleep prescribed by Psychiatry, but has been off this for a while now. She has had some dizziness the past 3 days, yesterday she felt like she was falling to one side. She lives with her husband and daughter and states there is no stress in their house. She does not drive. She has had left arm numbness and tingling since the fall, with pain in the middle of her back. She does not drive. There is no prior history of physical or emotional trauma. Her sister has a history of seizures. She had a normal birth and early development, no history of febrile seizures, CNS infections, significant head injuries, neurosurgical procedures.   Diagnostic Data: MRI brain without contrast in July 2017 was normal. There have been several EEGs as noted above with shaking episodes captured with no EEG correlate, consistent with psychogenic events.   PAST MEDICAL HISTORY: Past Medical History:  Diagnosis Date   Anemia    Anxiety    Arm skin lesion, left 09/05/2014   Constipation 12/27/2013   Depression    Dysphagia 04/15/2016   Eczema 08/22/2015   GERD (gastroesophageal reflux disease)    Hematuria 03/04/2016   History of shingles 01/22/2016   Hyperglycemia 01/15/2015   Hyperlipidemia, mixed 08/22/2015   Hypertension    Menometrorrhagia 2006   Migraine, unspecified, without mention of intractable migraine  without mention of status migrainosus    Preventative health care 11/04/2016   Pseudoseizures ([place])    Seizures ([place])    history of pseudoseizure disorder, last last seizure 3 wks, keppra inc in dosage   Tubular adenoma of colon 05/2011   Uterine leiomyoma 2006    MEDICATIONS: Current Outpatient Medications on File Prior to Visit  Medication Sig Dispense Refill   Aspirin-Caffeine (BAYER BACK & BODY) 500-32.5 MG TABS Take 1 tablet by mouth 2 (two) times daily as needed (pain).     atorvastatin (LIPITOR) 10 MG tablet Take 1 tablet (10 mg total) by mouth daily. 90 tablet 1   butalbital-acetaminophen-caffeine (FIORICET) 50-325-40 MG tablet Take 1 tablet by mouth 2 (two) times daily. 60 tablet 0   colchicine 0.6 MG tablet Take 2 tabs by mouth now, and repeat in 1 hour.  If symptoms are not improved by tomorrow- may repeat same dosing tomorrow 6 tablet 0   dicyclomine (BENTYL) 20 MG tablet Take 1 tablet (20 mg total) by mouth 2 (  two) times daily. 20 tablet 0   esomeprazole (NEXIUM) 40 MG capsule Take 1 capsule (40 mg total) by mouth 2 (two) times daily. Take one tablet 30-60 minutes before breakfast and dinner. 60 capsule 2   gabapentin (NEURONTIN) 300 MG capsule Take 300 mg by mouth 3 (three) times daily.     ibuprofen (ADVIL) 600 MG tablet Take 1 tablet (600 mg total) by mouth every 6 (six) hours as needed for up to 30 doses for mild pain or moderate pain. 30 tablet 0   meloxicam (MOBIC) 15 MG tablet Take 1 tablet (15 mg total) by mouth daily. 30 tablet 1   metoCLOPramide (REGLAN) 10 MG tablet Take 1 tablet (10 mg total) by mouth 4 (four) times daily. Take one tablet before drinking each prep. 2 tablet 0   ondansetron (ZOFRAN ODT) 4 MG disintegrating tablet Take 1 tablet (4 mg total) by mouth every 8 (eight) hours as needed for nausea or vomiting. 20 tablet 0   sucralfate (CARAFATE) 1 g tablet Take 1 tablet (1 g total) by mouth 4 (four) times daily -  with meals and at bedtime. 30 tablet 0   No  current facility-administered medications on file prior to visit.    ALLERGIES: No Known Allergies  FAMILY HISTORY: Family History  Problem Relation Age of Onset   Heart disease Mother    Heart attack Mother    Cancer Father        colon   Hypertension Father    Cancer Sister        brain   Hypertension Sister    Cancer Sister        unsure of type   Diabetes Sister        type 2   Heart attack Sister    Cancer Brother    Hypertension Brother    Colon cancer Neg Hx    Esophageal cancer Neg Hx    Pancreatic cancer Neg Hx    Stomach cancer Neg Hx     SOCIAL HISTORY: Social History   Socioeconomic History   Marital status: Married    Spouse name: Gwyndolyn Saxon   Number of children: 2   Years of education: Not on file   Highest education level: Not on file  Occupational History   Occupation: CMA-retired  Tobacco Use   Smoking status: Every Day    Packs/day: 0.50    Years: 40.00    Pack years: 20.00    Types: Cigarettes    Start date: 12/18/2015   Smokeless tobacco: Never   Tobacco comments:    3-4 cigarettes daily  Vaping Use   Vaping Use: Never used  Substance and Sexual Activity   Alcohol use: Yes    Comment: wine occassioanlly    Drug use: No   Sexual activity: Not on file    Comment: lives with husband, no dietary restrictions.   Other Topics Concern   Not on file  Social History Narrative   Lives with her husband and their son.   Daughter lives in [place], [place].   Right handed   Social Determinants of Health   Financial Resource Strain: Not on file  Food Insecurity: Not on file  Transportation Needs: Not on file  Physical Activity: Not on file  Stress: Not on file  Social Connections: Not on file  Intimate Partner Violence: Not on file     PHYSICAL EXAM: Vitals:   03/17/21 1411  BP: 133/85  Pulse: 66  SpO2: 100%  General: No acute distress Head:  Normocephalic/atraumatic Skin/Extremities: No rash, no edema Neurological Exam: alert and  oriented to person, place, and time. No aphasia or dysarthria. Fund of knowledge is appropriate. Attention and concentration are normal.   Cranial nerves: Pupils equal, round. Extraocular movements intact with no nystagmus. Visual fields full.  No facial asymmetry. Decreased light touch on left V1-2. Motor: Bulk and tone normal, muscle strength 5/5 throughout, reporting left arm pain and shooting pain down left leg during strength testing. No pronator drift. Sensation decreased on left UE and LE. Reflexes +1 throughout.   Finger to nose testing intact.  Gait limping with left leg   IMPRESSION: This is a 60 yo RH woman with a history of hypertension, hyperlipidemia, anxiety, depression, migraines, and psychogenic non-epileptic events captured on several EEG studies since 2011. She and her husband are aware of her psychogenic seizures that are triggered by pain, she had an episode in the ER when they report her arm was pulled up. She presented to the ER 01/22/21 due to left face/arm/leg weakness, which is a new symptom. Exam today shows full strength, decreased sensation on the left, favoring left leg due to pain suggestive of sciatica. Conversation disorder was diagnosed in the ER, which is still a possibility however would do MRI brain without contrast to rule out stroke, her CT/CTA did not show any abnormalities. She has vascular risk factors, continue control of vascular risk factors, advised to start daily aspirin 81mg . She will be referred for physical therapy for left leg weakness/pain. She continues to report a lot of pain, can discuss increasing gabapentin dose with PCP. Follow-up in 6 months, call for any changes.    Thank you for allowing me to participate in her care.  Please do not hesitate to call for any questions or concerns.    Ellouise Newer, M.D.   CC: Dr. Charlett Blake

## 2021-03-17 NOTE — Patient Instructions (Signed)
Schedule MRI brain without contrast  2. Start daily aspirin 81mg    3. Referral will be sent for physical therapy for left leg  4. Can discuss increasing gabapentin dose with Dr. Charlett Blake  5. Follow-up in 6 months, call for any changes

## 2021-03-19 NOTE — Progress Notes (Addendum)
Cardiology Office Note:    Date:  03/21/2021   ID:  Ruth Bell, DOB 02/18/1961, MRN [id number]  PCP:  Ruth Lukes, MD  Cardiologist:  Ruth More, MD   Referring MD: Ruth Lukes, MD  ASSESSMENT:    1. Palpitations   2. Essential hypertension   3. Hyperlipidemia, mixed    PLAN:     She called back to my office and is taking both metoprolol and amlodipine and will continue both of these agents at this point in time.  In order of problems listed above:  Her clinical complaint is palpitation symptoms are frequent and we will apply and event monitor in the office today and I think it should be able to guide treatment.  Unfortunately she is taking 2 prescription drugs and can be proarrhythmic Zanaflex and Reglan but I would not adjust it until I see the results of her monitor.  If having frequent PVCs a beta-blocker could be beneficial. Confusing as she tells me she is taking an antihypertensive agent but not listed on her medications I asked her when she got home to reconcile with my office Continue her statin  Next appointment 4 weeks   Medication Adjustments/Labs and Tests Ordered: Current medicines are reviewed at length with the patient today.  Concerns regarding medicines are outlined above.  Orders Placed This Encounter  Procedures   LONG TERM MONITOR (3-14 DAYS)   No orders of the defined types were placed in this encounter.    Chief Complaint  Patient presents with   Palpitations  She has been bothered for about 3 months with palpitation initially started off is intermittent forceful sensation of flipping a little breathless and now it occurs very frequently often times on and off throughout the day and at times is awakened her from her sleep at night.  History of Present Illness:    Ruth Bell is a 61 y.o. female with a history of hypertension, hyperlipidemia, anxiety, depression, migraines, and psychogenic non-epileptic events who is being seen today  for the evaluation of palpitation at the request of Ruth Lukes, MD.  She was previously seen by Dr. Ellyn Bell in evaluation for palpitation felt to be well controlled with beta-blocker and nonanginal chest pain and was not felt to require further cardiac evaluation.  Chart review shows that she is taking Reglan and Bentyl with the potential of causing arrhythmia or accelerating heart rate.  She had an EKG performed 01/22/2021 showing sinus rhythm left and right atrial enlargement otherwise normal EKG.  I independently reviewed this EKG at the time of the office visit  Echo 02/23/2016: - Left ventricle: The cavity size was normal. Wall thickness was    normal. Systolic function was normal. The estimated ejection    fraction was in the range of 55% to 60%. Wall motion was normal;    there were no regional wall motion abnormalities. Doppler    parameters are consistent with abnormal left ventricular    relaxation (grade 1 diastolic dysfunction).  - Aortic valve: There was no stenosis.  - Mitral valve: There was no significant regurgitation.  - Right ventricle: The cavity size was normal. Systolic function    was normal.   Myoview April 20, 2016: Reached heart rate of 127 bpm with Lexiscan.  EF 60 to 65%.  LOW RISK.  No ischemia or infarction.   Event monitor 03/06/2016:  The initial Baseline transmission shows SINUS BRADYCARDIA, SINUS RHYTHM Rare PVCs and PACs noted. No arrhythmias noted.  The high average heart rate seen for the monitored period was 80 BPM. The low average heart rate seen for the monitored period was 60 BPM. Symptoms of lightheadedness and dizziness, fluttering in chest and chest pain noted with sinus rhythm. Also symptom of "pass out" occurred with sinus rhythm.   Her clinical complaint is palpitation duration 3 months Initially sporadic momentary sensation of flipping and little breathless has become very severe recently she says at times it will be off-and-on  throughout the day interferes with her life and has awaken her from her sleep at night.  Has not been particularly rapid.  She is taking Reglan twice a day and Bentyl once daily takes no over-the-counter proarrhythmic drugs and has no known history of heart disease congenital rheumatic or arrhythmia.  She has a history of hypertension is not on any antihypertensive agent but then she tells me she is taking 1 twice a day I have asked her when she got home to call me and reconcile her medications She is limited by pain left lower extremity rating from her back as well as pain in the left upper extremity but is not having edema chest pain shortness of breath or syncope.  I reviewed her history her EKG and her previous cardiology testing with the patient. Past Medical History:  Diagnosis Date   Anemia    Anxiety    Arm skin lesion, left 09/05/2014   Constipation 12/27/2013   Depression    Dysphagia 04/15/2016   Eczema 08/22/2015   GERD (gastroesophageal reflux disease)    Hematuria 03/04/2016   History of shingles 01/22/2016   Hyperglycemia 01/15/2015   Hyperlipidemia, mixed 08/22/2015   Hypertension    Menometrorrhagia 2006   Migraine, unspecified, without mention of intractable migraine without mention of status migrainosus    Preventative health care 11/04/2016   Pseudoseizures ([place])    Seizures ([place])    history of pseudoseizure disorder, last last seizure 3 wks, keppra inc in dosage   Tubular adenoma of colon 05/2011   Uterine leiomyoma 2006    Past Surgical History:  Procedure Laterality Date   ABDOMINAL HYSTERECTOMY  04/06/04   APPENDECTOMY     BILATERAL SALPINGOOPHORECTOMY  04/06/04   CARDIAC EVENT MONITOR  02/2016   Mostly sinus bradycardia and sinus rhythm.  Rare PVCs and PACs.  No arrhythmias.  Symptoms of chest pain and fluttering as well as "passed out spell" noted with normal sinus rhythm.   MASS EXCISION Left 06/02/2015   Procedure: EXCISION LEFT ARM MASS;  Surgeon: Ruth Bell  III, MD;  Location: [facility];  Service: General;  Laterality: Left;   NM MYOVIEW LTD  04/2016   Reached heart rate of 127 bpm with Lexiscan.  EF 60 to 65%.  LOW RISK.  No ischemia or infarction.   ROTATOR CUFF REPAIR     TRANSTHORACIC ECHOCARDIOGRAM  01/2016   F 55-6%.  No R WMA.  GR 1 DD.  Normal valves.    Current Medications: Current Meds  Medication Sig   Aspirin-Caffeine (BAYER BACK & BODY) 500-32.5 MG TABS Take 1 tablet by mouth 2 (two) times daily as needed (pain).   atorvastatin (LIPITOR) 10 MG tablet Take 1 tablet (10 mg total) by mouth daily.   butalbital-acetaminophen-caffeine (FIORICET) 50-325-40 MG tablet Take 1 tablet by mouth 2 (two) times daily.   colchicine 0.6 MG tablet Take 2 tabs by mouth now, and repeat in 1 hour.  If symptoms are not improved by tomorrow- may repeat  same dosing tomorrow   dicyclomine (BENTYL) 20 MG tablet Take 1 tablet (20 mg total) by mouth 2 (two) times daily.   esomeprazole (NEXIUM) 40 MG capsule Take 1 capsule (40 mg total) by mouth 2 (two) times daily. Take one tablet 30-60 minutes before breakfast and dinner.   gabapentin (NEURONTIN) 300 MG capsule Take 300 mg by mouth 3 (three) times daily.   ibuprofen (ADVIL) 600 MG tablet Take 1 tablet (600 mg total) by mouth every 6 (six) hours as needed for up to 30 doses for mild pain or moderate pain.   meloxicam (MOBIC) 15 MG tablet Take 1 tablet (15 mg total) by mouth daily.   metoCLOPramide (REGLAN) 10 MG tablet Take 1 tablet (10 mg total) by mouth 4 (four) times daily. Take one tablet before drinking each prep.   ondansetron (ZOFRAN ODT) 4 MG disintegrating tablet Take 1 tablet (4 mg total) by mouth every 8 (eight) hours as needed for nausea or vomiting.   sucralfate (CARAFATE) 1 g tablet Take 1 tablet (1 g total) by mouth 4 (four) times daily -  with meals and at bedtime.     Allergies:   Patient has no known allergies.   Social History   Socioeconomic History   Marital status:  Married    Spouse name: Gwyndolyn Saxon   Number of children: 2   Years of education: Not on file   Highest education level: Not on file  Occupational History   Occupation: CMA-retired  Tobacco Use   Smoking status: Every Day    Packs/day: 0.50    Years: 40.00    Pack years: 20.00    Types: Cigarettes    Start date: 12/18/2015   Smokeless tobacco: Never   Tobacco comments:    3-4 cigarettes daily  Vaping Use   Vaping Use: Never used  Substance and Sexual Activity   Alcohol use: Yes    Comment: wine occassioanlly    Drug use: No   Sexual activity: Not on file    Comment: lives with husband, no dietary restrictions.   Other Topics Concern   Not on file  Social History Narrative   Lives with her husband and their son.   Daughter lives in [place], [place].   Right handed   Social Determinants of Health   Financial Resource Strain: Not on file  Food Insecurity: Not on file  Transportation Needs: Not on file  Physical Activity: Not on file  Stress: Not on file  Social Connections: Not on file     Family History: The patient's family history includes Cancer in her brother, father, sister, and sister; Diabetes in her sister; Heart attack in her mother and sister; Heart disease in her mother; Hypertension in her brother, father, and sister. There is no history of Colon cancer, Esophageal cancer, Pancreatic cancer, or Stomach cancer.  ROS:   ROS Please see the history of present illness.     All other systems reviewed and are negative.  EKGs/Labs/Other Studies Reviewed:    The following studies were reviewed today:  Recent Labs: 03/25/2020: Magnesium 2.2 01/22/2021: ALT 16; BUN 7; Creatinine, Ser 0.63; Hemoglobin 14.6; Platelets 346; Potassium 3.5; Sodium 141  Recent Lipid Panel    Component Value Date/Time   CHOL 220 (H) 12/21/2019 1135   TRIG 86 12/21/2019 1135   HDL 68 12/21/2019 1135   CHOLHDL 3.2 12/21/2019 1135   VLDL 30.4 02/05/2019 1023   LDLCALC 133 (H) 12/21/2019 1135     Physical Exam:  VS:  BP (!) 147/71 (BP Location: Right Arm, Patient Position: Sitting, Cuff Size: Normal)    Pulse 76    Ht 5\' 2"  (1.575 m)    Wt 108 lb 12.8 oz (49.4 kg)    LMP  (LMP Unknown)    SpO2 98%    BMI 19.90 kg/m     Wt Readings from Last 3 Encounters:  03/21/21 108 lb 12.8 oz (49.4 kg)  03/17/21 109 lb 6.4 oz (49.6 kg)  01/31/21 102 lb 3.2 oz (46.4 kg)     GEN: She appears her age small stature well nourished, well developed in no acute distress HEENT: Normal NECK: No JVD; No carotid bruits LYMPHATICS: No lymphadenopathy CARDIAC: RRR, no murmurs, rubs, gallops RESPIRATORY:  Clear to auscultation without rales, wheezing or rhonchi  ABDOMEN: Soft, non-tender, non-distended MUSCULOSKELETAL:  No edema; No deformity  SKIN: Warm and dry NEUROLOGIC:  Alert and oriented x 3 PSYCHIATRIC:  Normal affect     Signed, Ruth More, MD  03/21/2021 10:58 AM    [place]

## 2021-03-21 ENCOUNTER — Ambulatory Visit (INDEPENDENT_AMBULATORY_CARE_PROVIDER_SITE_OTHER): Payer: 59 | Admitting: Cardiology

## 2021-03-21 ENCOUNTER — Telehealth: Payer: Self-pay | Admitting: Cardiology

## 2021-03-21 ENCOUNTER — Encounter: Payer: Self-pay | Admitting: Cardiology

## 2021-03-21 ENCOUNTER — Ambulatory Visit (INDEPENDENT_AMBULATORY_CARE_PROVIDER_SITE_OTHER): Payer: 59

## 2021-03-21 ENCOUNTER — Other Ambulatory Visit: Payer: Self-pay

## 2021-03-21 VITALS — BP 147/71 | HR 76 | Ht 62.0 in | Wt 108.8 lb

## 2021-03-21 DIAGNOSIS — E782 Mixed hyperlipidemia: Secondary | ICD-10-CM | POA: Diagnosis not present

## 2021-03-21 DIAGNOSIS — R002 Palpitations: Secondary | ICD-10-CM | POA: Diagnosis not present

## 2021-03-21 DIAGNOSIS — I10 Essential (primary) hypertension: Secondary | ICD-10-CM

## 2021-03-21 NOTE — Patient Instructions (Signed)
Medication Instructions:  Your physician recommends that you continue on your current medications as directed. Please refer to the Current Medication list given to you today.  *If you need a refill on your cardiac medications before your next appointment, please call your pharmacy*   Lab Work: None If you have labs (blood work) drawn today and your tests are completely normal, you will receive your results only by: [place] (if you have MyChart) OR A paper copy in the mail If you have any lab test that is abnormal or we need to change your treatment, we will call you to review the results.   Testing/Procedures: A zio monitor was ordered today. It will remain on for 7 days. You will then return monitor and event diary in provided box. It takes 1-2 weeks for report to be downloaded and returned to [name]. We will call you with the results. If monitor falls off or has orange flashing light, please call Zio for further instructions.     Follow-Up: At [facility], you and your health needs are our priority.  As part of our continuing mission to provide you with exceptional heart care, we have created designated Provider Care Teams.  These Care Teams include your primary Cardiologist (physician) and Advanced Practice Providers (APPs -  Physician Assistants and Nurse Practitioners) who all work together to provide you with the care you need, when you need it.  We recommend signing up for the patient portal called "MyChart".  Sign up information is provided on this After Visit Summary.  MyChart is used to connect with patients for Virtual Visits (Telemedicine).  Patients are able to view lab/test results, encounter notes, upcoming appointments, etc.  Non-urgent messages can be sent to your provider as well.   To learn more about what you can do with MyChart, go to [email or web address].    Your next appointment:   4 week(s)  The format for your next appointment:   In Person  Provider:    Shirlee More, MD    Other Instructions

## 2021-03-21 NOTE — Telephone Encounter (Signed)
Pt was advised to call with medicines that were not on her meds list:  AMLODIPINE BESYLATE 5MG  METOPROLOL TARTRATE 75 MG ATORVASTATINE 10 MG BUTALBACETAMINCAFF  RANITILDINE 150 MG TIZANIDINE 2 MG MELOXICAM 15 MG ESOMEPRAZOLE 40 MG LEVETIRACETAM 500 MG BAYER 81 MG

## 2021-03-23 ENCOUNTER — Telehealth: Payer: Self-pay

## 2021-03-23 ENCOUNTER — Ambulatory Visit: Payer: 59 | Attending: Neurology

## 2021-03-23 ENCOUNTER — Other Ambulatory Visit: Payer: Self-pay

## 2021-03-23 VITALS — BP 132/82 | HR 62

## 2021-03-23 DIAGNOSIS — M6281 Muscle weakness (generalized): Secondary | ICD-10-CM | POA: Diagnosis present

## 2021-03-23 DIAGNOSIS — R278 Other lack of coordination: Secondary | ICD-10-CM | POA: Diagnosis present

## 2021-03-23 DIAGNOSIS — R2681 Unsteadiness on feet: Secondary | ICD-10-CM | POA: Diagnosis present

## 2021-03-23 DIAGNOSIS — R2689 Other abnormalities of gait and mobility: Secondary | ICD-10-CM | POA: Insufficient documentation

## 2021-03-23 DIAGNOSIS — R519 Headache, unspecified: Secondary | ICD-10-CM | POA: Insufficient documentation

## 2021-03-23 DIAGNOSIS — F445 Conversion disorder with seizures or convulsions: Secondary | ICD-10-CM | POA: Insufficient documentation

## 2021-03-23 DIAGNOSIS — M79602 Pain in left arm: Secondary | ICD-10-CM | POA: Diagnosis present

## 2021-03-23 DIAGNOSIS — M25512 Pain in left shoulder: Secondary | ICD-10-CM | POA: Diagnosis present

## 2021-03-23 DIAGNOSIS — R531 Weakness: Secondary | ICD-10-CM | POA: Diagnosis not present

## 2021-03-23 NOTE — Telephone Encounter (Signed)
Dr. Delice Lesch, Below is my clinical impression from [name]'s eval. I wanted to get your thoughts. Nothing was adding up and she literally displayed everything that her husband was saying during the "seizure episode". Vitals were normal prior and throughout. I was glad I had thoroughly reviewed the chart prior. If she continues to have these "seizures" during therapy session I don't believe I would continue as would not be beneficial. Has she seen neuropsych? Any guidance would be appreciated. Thanks!  Pt is 61 y/o female who presented with  referral for left sided weakness and psychogenic nonepileptic seizure. Pt  reporting she had a CVA in November but per medical record all imaging was  negative. Husband present during eval and stated she has "pain induced  seizures". Pt was alert and responsive during most of evaluation but did  become tearful when PT started sensation testing. Pt reported not being  able to feel anything throughout whole left side. Pt's strength was  decreased throughout left with catch and relief grossly 2- to 3+/5  throughout with pt reporting pain with any overpressure. She reports most  pain is in left shoulder. After testing and 5 min rest while PT was typing  information, PT turned back to patient and asked her to stand up. She  stared off and then closed eyes and head slumped forward. Husband reports  she had seizure from PT bringing on pain. States she used to have grand  mal seizures with jerking and they were told it was in her head. When PT  asked if they usually called ambulance when this occurred he said no need.  Stated they can last 2-5 minutes. PT supported pt in sitting and monitored  pulse throughout which remained normal. All vitals were normal prior to  episode occurring as well. Husband began to state how things would  progress with her gradually waking up with dry mouth and confusion and  then she would become tearful as would not recognize therapist.  These  things all then occurred in that order. He also stated her pulse would be  racing but it remained consistent throughout. Pt confused when she aroused  and was assisted to w/c to leave clinic as husband stated she would need  at least 3 days to recooperate. States it has been a month since last  seizure. Pt's speech was changed when she started speaking but no slurred.  PT unable to assess further due to this. Pt had been asking about OT for  shoulder but PT will need to assess further and speak with neurologist to  decide the best way to proceed.    [name], PT, DPT, NCS

## 2021-03-23 NOTE — Therapy (Signed)
[facility] [address] [place], [place], [address] Phone: [phone]   Fax:  [phone]  Physical Therapy Evaluation  Patient Details  Name: Ruth Bell MRN: [id number] Date of Birth: 07/01/1960 Referring Provider (PT): Ellouise Newer   Encounter Date: 03/23/2021   PT End of Session - 03/23/21 1058     Visit Number 1    Number of Visits 8    Date for PT Re-Evaluation 05/12/21    Authorization Type [employer]    PT Start Time [phone]    PT Stop Time 1020    PT Time Calculation (min) 46 min    Activity Tolerance Other (comment)   pt with seizure-like activity nodding off and unresponsive for a minute of so   Behavior During Therapy --   tearful at times, seizure like activity where nodded off            Past Medical History:  Diagnosis Date   Anemia    Anxiety    Arm skin lesion, left 09/05/2014   Constipation 12/27/2013   Depression    Dysphagia 04/15/2016   Eczema 08/22/2015   GERD (gastroesophageal reflux disease)    Hematuria 03/04/2016   History of shingles 01/22/2016   Hyperglycemia 01/15/2015   Hyperlipidemia, mixed 08/22/2015   Hypertension    Menometrorrhagia 2006   Migraine, unspecified, without mention of intractable migraine without mention of status migrainosus    Preventative health care 11/04/2016   Pseudoseizures ([place])    Seizures ([place])    history of pseudoseizure disorder, last last seizure 3 wks, keppra inc in dosage   Tubular adenoma of colon 05/2011   Uterine leiomyoma 2006    Past Surgical History:  Procedure Laterality Date   ABDOMINAL HYSTERECTOMY  04/06/04   APPENDECTOMY     BILATERAL SALPINGOOPHORECTOMY  04/06/04   CARDIAC EVENT MONITOR  02/2016   Mostly sinus bradycardia and sinus rhythm.  Rare PVCs and PACs.  No arrhythmias.  Symptoms of chest pain and fluttering as well as "passed out spell" noted with normal sinus rhythm.   MASS EXCISION Left 06/02/2015   Procedure:  EXCISION LEFT ARM MASS;  Surgeon: Autumn Messing III, MD;  Location: [facility];  Service: General;  Laterality: Left;   NM MYOVIEW LTD  04/2016   Reached heart rate of 127 bpm with Lexiscan.  EF 60 to 65%.  LOW RISK.  No ischemia or infarction.   ROTATOR CUFF REPAIR     TRANSTHORACIC ECHOCARDIOGRAM  01/2016   F 55-6%.  No R WMA.  GR 1 DD.  Normal valves.    Vitals:   03/23/21 0943  BP: 132/82  Pulse: 62      Subjective Assessment - 03/23/21 0943     Subjective Pt was referred for left sided weakness and psychogenic nonepileptic seizure. She reports she had a stroke on 11/6 but imaging was all negative and had left sided weakness throughout including face. She reports still having some numbness. Pt reports that this is different than the seizure like activity she has had in the past. She reports 1 fall when this occurred as left leg gave out on her. She denies any vision changes.    Patient is accompained by: Family member   husband   Pertinent History anemia, anxiety, depression, HTN, HLD, migraine- stress triggered.    Patient Stated Goals Pt wants to be able to go to work.    Currently in Pain? Yes  Pain Score 7     Pain Location Shoulder    Pain Orientation Left;Posterior    Pain Descriptors / Indicators Aching    Pain Type Chronic pain    Pain Onset More than a month ago    Pain Frequency Intermittent    Aggravating Factors  trying to use left arm a lot.                [facility] PT Assessment - 03/23/21 0948       Assessment   Medical Diagnosis left sided weakness, psychogenic nonepileptic seizure    Referring Provider (PT) Ellouise Newer    Onset Date/Surgical Date 01/22/21    Hand Dominance Right    Prior Therapy a long time ago had PT for neck      Precautions   Precautions Fall    Precaution Comments pt wearing heart monitor she received yesterday for 1 week      Balance Screen   Has the patient fallen in the past 6 months Yes    How many times? 1     Has the patient had a decrease in activity level because of a fear of falling?  Yes    Is the patient reluctant to leave their home because of a fear of falling?  No      Home Environment   Living Environment Private residence    Living Arrangements Spouse/significant other    Available Help at Discharge Family    Type of Fellows Access Level entry    Gonzales bars - tub/shower      Prior Function   Level of Independence Independent;Independent with community mobility without device   reports she did tire out quickly prior though. Took her longer to do things. Does not drive.   Vocation Part time employment    Radiographer, therapeutic, Scientist, water quality    Leisure watch TV, walking about a block prior      Cognition   Overall Cognitive Status Within Functional Limits for tasks assessed      Sensation   Light Touch Impaired by gross assessment    Additional Comments Pt denied any sensation upon testing on left side in LUE and LLE with light touch. Right side intact.      Coordination   Fine Motor Movements are Fluid and Coordinated No   intact right finger opposition, left slowed and pt reports she can't feel     ROM / Strength   AROM / PROM / Strength Strength;PROM      PROM   Overall PROM Comments left shoulder PROM flexion and abd to about 70 degrees with empty end feel and pain      Strength   Overall Strength Comments With strength testing on left pt unable to hold against resistance with some catch/release noted. Pt reported pain with all resistance applied throughout left.    Strength Assessment Site Shoulder;Elbow;Hand;Hip;Knee;Ankle    Right/Left Shoulder Right;Left    Right Shoulder Flexion 5/5    Left Shoulder Flexion 2-/5    Left Shoulder ABduction 2-/5    Right/Left Elbow Right;Left    Right Elbow Flexion 5/5    Right Elbow Extension 5/5    Left Elbow Flexion 3+/5    Left Elbow Extension 3+/5    Right/Left  hand Right;Left    Right Hand Gross Grasp Functional    Left Hand Gross Grasp Impaired    Right/Left  Hip Right;Left    Right Hip Flexion 5/5    Left Hip Flexion 2+/5    Right/Left Knee Right;Left    Right Knee Flexion 5/5    Right Knee Extension 5/5    Left Knee Flexion 3+/5    Left Knee Extension 3+/5    Right/Left Ankle Right;Left    Right Ankle Dorsiflexion 4+/5    Left Ankle Dorsiflexion 3/5      Transfers   Transfers Sit to Stand;Stand to Sit    Sit to Stand 5: Supervision    Stand to Sit 5: Supervision      Ambulation/Gait   Ambulation/Gait Yes    Ambulation/Gait Assistance 5: Supervision    Ambulation/Gait Assistance Details ambulated in to clinic without AD with decreased left foot clearance.    Ambulation Distance (Feet) 50 Feet    Assistive device None    Gait Pattern Step-through pattern;Decreased step length - left;Decreased hip/knee flexion - left;Decreased dorsiflexion - left    Ambulation Surface Level;Indoor    Gait Comments Pt had to be wheeled out of clinic in w/c after "seizure like" episode.                        Objective measurements completed on examination: See above findings.                PT Education - 03/23/21 1342     Education Details Discussion on will need to follow-up again to fully complete eval and determine plan due to seizure like activity.    Person(s) Educated Patient;Spouse    Methods Explanation    Comprehension Verbalized understanding              PT Short Term Goals - 03/23/21 1905       PT SHORT TERM GOAL #1   Title Pt will be independent with HEP for strengthening and balance to continue gains on own.    Time 4    Period Weeks    Status New    Target Date 04/20/21      PT SHORT TERM GOAL #2   Title Pt will ambulate >500' without AD on level surfaces independently for improved household and short community distances.    Time 4    Period Weeks    Status New    Target Date 04/20/21       PT SHORT TERM GOAL #3   Title Functional testing will be completed at follow-up visit and LTGs updated.    Time 4    Period Weeks    Status New    Target Date 04/20/21               PT Long Term Goals - 03/23/21 1908       PT LONG TERM GOAL #1   Title LTGs TBD after further testing completed.    Time 8    Period Weeks    Status New    Target Date 05/12/21                    Plan - 03/23/21 1346     Clinical Impression Statement Pt is 61 y/o female who presented with referral for left sided weakness and psychogenic nonepileptic seizure. Pt reporting she had a CVA in November but per medical record all imaging was negative. Husband present during eval and stated she has "pain induced seizures". Pt was alert and responsive during most of evaluation but did become tearful  when PT started sensation testing. Pt reported not being able to feel anything throughout whole left side. Pt's strength was decreased throughout left with catch and relief grossly 2- to 3+/5 throughout with pt reporting pain with any overpressure. She reports most pain is in left shoulder. After testing and 5 min rest while PT was typing information, PT turned back to patient and asked her to stand up. She stared off and then closed eyes and head slumped forward. Husband reports she had seizure from PT bringing on pain. States she used to have grand mal seizures with jerking and they were told it was in her head. When PT asked if they usually called ambulance when this occurred he said no need. Stated they can last 2-5 minutes. PT supported pt in sitting and monitored pulse throughout which remained normal. All vitals were normal prior to episode occurring as well. Husband began to state how things would progress with her gradually waking up with dry mouth and confusion and then she would become tearful as would not recognize therapist. These things all then occurred in that order. He also stated her pulse  would be racing but it remained consistent throughout. Pt confused when she aroused and was assisted to w/c to leave clinic as husband stated she would need at least 3 days to recooperate. States it has been a month since last seizure. Pt's speech was changed when she started speaking but no slurred. PT unable to assess further due to this. Pt had been asking about OT for shoulder but PT will need to assess further and speak with neurologist to decide the best way to proceed.    Personal Factors and Comorbidities Comorbidity 3+;Behavior Pattern    Comorbidities anemia, anxiety, depression, HTN, HLD, migraine,    Examination-Activity Limitations Stairs;Locomotion Level;Transfers    Examination-Participation Restrictions Community Activity;Occupation;Meal Prep;Cleaning;Laundry    Stability/Clinical Decision Making Evolving/Moderate complexity    Clinical Decision Making Moderate    Rehab Potential Fair    PT Frequency 1x / week    PT Duration 8 weeks    PT Treatment/Interventions DME Instruction;Gait training;Stair training;Functional mobility training;Therapeutic activities;Therapeutic exercise;Balance training;Neuromuscular re-education;Passive range of motion;Patient/family education    PT Next Visit Plan Functional testing and outcome measures. Watch for "seizures". Decide if plan of care needs to be updated or appropriate and adjust/update goals as needed. What did Dr. Delice Lesch say in follow-up.             Patient will benefit from skilled therapeutic intervention in order to improve the following deficits and impairments:  Abnormal gait, Decreased mobility, Decreased strength, Impaired sensation, Decreased knowledge of use of DME, Decreased balance  Visit Diagnosis: Other abnormalities of gait and mobility  Muscle weakness (generalized)  Unsteadiness on feet     Problem List Patient Active Problem List   Diagnosis Date Noted   Left-sided weakness 01/31/2021   Gout 01/31/2021    Urinary frequency 05/07/2017   Abdominal bloating 05/07/2017   Back pain 03/21/2017   Tachycardia 02/28/2017   Preventative health care 11/04/2016   Hoarseness 04/15/2016   Dysphagia 04/15/2016   Atypical chest pain 04/13/2016   Dysuria 03/04/2016   Hematuria 03/04/2016   Palpitations 01/30/2016   History of shingles 01/22/2016   Eczema 08/22/2015   Hyperlipidemia, mixed 08/22/2015   Hyperglycemia 01/15/2015   Lumbar radiculopathy 11/19/2014   GERD (gastroesophageal reflux disease) 09/05/2014   Noncompliance with medications 08/11/2014   Constipation 12/27/2013   Pain in joint, shoulder region 10/13/2013   Shoulder  pain 09/23/2013   Numbness and tingling in right hand 09/23/2013   Jaw pain 03/26/2013   Neck pain 05/17/2012   Seizure disorder ([place]) 02/21/2012   Non compliance w medication regimen 02/21/2012   Essential hypertension 09/06/2011   Abdominal pain 06/20/2011   Hemorrhoids 06/20/2011   Hx of adenomatous colonic polyps 06/13/2011   Tubular adenoma of colon 05/2011   Musculoskeletal chest pain 04/23/2011   Radiculopathy of leg 02/02/2011   History of pseudoseizure 07/10/2010   Anxiety and depression 12/29/2009   TINEA PEDIS 06/02/2009   Exertional dyspnea 01/13/2008   MICROSCOPIC HEMATURIA 06/24/2007   WEIGHT LOSS 04/28/2007   Anemia 03/10/2007   Migraine 03/10/2007   ARTHRITIS 03/10/2007   SYNCOPE 03/10/2007   Menometrorrhagia 2006    Electa Sniff, PT, DPT, NCS 03/23/2021, 7:10 PM  [facility] [address] [place], [place], [address] Phone: [phone]   Fax:  [phone]  Name: BAANI BOBER MRN: [id number] Date of Birth: 1960-07-10

## 2021-03-27 NOTE — Telephone Encounter (Signed)
Thank you for the update. Psychogenic events are certainly harder to treat, she had been seeing Psychiatry in the past as this has been going on for a long time, I don't think she sees Behavioral health now. Pain is certainly a trigger for her events, hopefully continued follow-up with her pain specialist to help improve trigger may help further, otherwise I think you can just continue to document your findings and treatment, and if not beneficial, discuss with patient. Thank you for your help.

## 2021-03-28 ENCOUNTER — Telehealth: Payer: Self-pay | Admitting: Neurology

## 2021-03-28 NOTE — Telephone Encounter (Signed)
Pt's husband called in stating she was supposed to have an MRI with [facility], but they are not in network with their insurance.

## 2021-03-28 NOTE — Telephone Encounter (Signed)
Spoke with Gwyndolyn Saxon, sending a new referral to [facility], with MRI order phone [phone]. They will contact patient for time .

## 2021-03-29 ENCOUNTER — Other Ambulatory Visit: Payer: Self-pay

## 2021-03-29 ENCOUNTER — Ambulatory Visit: Payer: 59

## 2021-03-29 VITALS — BP 138/78 | HR 58

## 2021-03-29 DIAGNOSIS — R2689 Other abnormalities of gait and mobility: Secondary | ICD-10-CM | POA: Diagnosis not present

## 2021-03-29 DIAGNOSIS — R2681 Unsteadiness on feet: Secondary | ICD-10-CM

## 2021-03-29 DIAGNOSIS — M6281 Muscle weakness (generalized): Secondary | ICD-10-CM

## 2021-03-29 NOTE — Telephone Encounter (Signed)
Dr. Delice Lesch,  She did a little better today. I will proceed cautiously. She denies seeing pain management and I was wondering if you could get this set up as it sounded from your note that she already was. I will not directly address pain as she does not tolerate any manual techniques well but will indirectly make some changes that may help. Wondering if you can put in an OT referral to address her arm. Thanks so much! Raquel Sarna

## 2021-03-29 NOTE — Therapy (Signed)
[facility] [address] [place], [place], [address] Phone: [phone]   Fax:  [phone]  Physical Therapy Treatment  Patient Details  Name: Ruth Bell MRN: [id number] Date of Birth: 24-May-1960 Referring Provider (PT): Ellouise Newer   Encounter Date: 03/29/2021   PT End of Session - 03/29/21 0852     Visit Number 2    Number of Visits 8    Date for PT Re-Evaluation 05/12/21    Authorization Type [employer]    PT Start Time [phone]    PT Stop Time 0936    PT Time Calculation (min) 46 min    Equipment Utilized During Treatment Gait belt    Activity Tolerance Patient limited by pain    Behavior During Therapy --   tearful at times, seizure like activity where nodded off            Past Medical History:  Diagnosis Date   Anemia    Anxiety    Arm skin lesion, left 09/05/2014   Constipation 12/27/2013   Depression    Dysphagia 04/15/2016   Eczema 08/22/2015   GERD (gastroesophageal reflux disease)    Hematuria 03/04/2016   History of shingles 01/22/2016   Hyperglycemia 01/15/2015   Hyperlipidemia, mixed 08/22/2015   Hypertension    Menometrorrhagia 2006   Migraine, unspecified, without mention of intractable migraine without mention of status migrainosus    Preventative health care 11/04/2016   Pseudoseizures ([place])    Seizures ([place])    history of pseudoseizure disorder, last last seizure 3 wks, keppra inc in dosage   Tubular adenoma of colon 05/2011   Uterine leiomyoma 2006    Past Surgical History:  Procedure Laterality Date   ABDOMINAL HYSTERECTOMY  04/06/04   APPENDECTOMY     BILATERAL SALPINGOOPHORECTOMY  04/06/04   CARDIAC EVENT MONITOR  02/2016   Mostly sinus bradycardia and sinus rhythm.  Rare PVCs and PACs.  No arrhythmias.  Symptoms of chest pain and fluttering as well as "passed out spell" noted with normal sinus rhythm.   MASS EXCISION Left 06/02/2015   Procedure: EXCISION LEFT ARM MASS;   Surgeon: Autumn Messing III, MD;  Location: [facility];  Service: General;  Laterality: Left;   NM MYOVIEW LTD  04/2016   Reached heart rate of 127 bpm with Lexiscan.  EF 60 to 65%.  LOW RISK.  No ischemia or infarction.   ROTATOR CUFF REPAIR     TRANSTHORACIC ECHOCARDIOGRAM  01/2016   F 55-6%.  No R WMA.  GR 1 DD.  Normal valves.    Vitals:   03/29/21 0853  BP: 138/78  Pulse: (!) 58  SpO2: 96%     Subjective Assessment - 03/29/21 0853     Subjective Pt reports that she had a bad seizure on Saturday morning. Woke up with some chest pain and walked out to sit up in living room and reports next thing she knew she was having a seizure. Husband describes as mild lasting about 3 min with tremors. Pt removed her heart monitor yesterday and sent back to Dr. Juliann Mule today.    Patient is accompained by: Family member   husband   Pertinent History anemia, anxiety, depression, HTN, HLD, migraine- stress triggered.    Patient Stated Goals Pt wants to be able to go to work.    Currently in Pain? Yes    Pain Score 8     Pain Location Shoulder    Pain  Orientation Left;Posterior    Pain Descriptors / Indicators Sharp    Pain Type Chronic pain    Pain Radiating Towards down to elbow    Pain Onset More than a month ago    Pain Frequency Constant    Aggravating Factors  trying to use arm    Multiple Pain Sites Yes    Pain Score 5    Pain Location Hip    Pain Orientation Left    Pain Descriptors / Indicators Aching    Pain Type Chronic pain    Pain Onset More than a month ago    Pain Frequency Intermittent    Aggravating Factors  walking a lot and standing                [facility] PT Assessment - 03/29/21 0856       Special Tests    Special Tests Lumbar    Lumbar Tests Straight Leg Raise      Straight Leg Raise   Findings Positive    Side  Left    Comment Negative on right until got to about 80 degrees felt some pain in low back with no referral. Positive on left with pt  unable to tolerate more than 30 degrees.                           [facility] Adult PT Treatment/Exercise - 03/29/21 0856       Transfers   Transfers Sit to Stand;Stand to Sit    Sit to Stand 5: Supervision    Sit to Stand Details (indicate cue type and reason) Pt's weight is shifted mostly to right with rising. Good anterior lean.    Five time sit to stand comments  30.47 sec from chair without hands    Stand to Sit 5: Supervision      Ambulation/Gait   Ambulation/Gait Yes    Ambulation/Gait Assistance 5: Supervision    Ambulation/Gait Assistance Details Pt ambulated in to clinic and for testing without AD with antalgic gait on left. Added SPC trial at end and pt had better weight shift to left with increased stability. Needed initial cuing for sequence but picked up quick. Pt did report pain radiating down left left in front and back with gait. Pt discussed purchasing SPC or cane with quad tip. Showed the difference. Husband reports he has already checked in to them and knows where to get.    Ambulation Distance (Feet) 75 Feet    Assistive device None;Straight cane    Gait Pattern Step-through pattern;Decreased step length - right;Decreased step length - left;Decreased stance time - left;Decreased hip/knee flexion - left;Decreased weight shift to left;Antalgic    Ambulation Surface Level;Indoor    Gait velocity 18.07 sec=0.78m/s      Standardized Balance Assessment   Standardized Balance Assessment Timed Up and Go Test      Timed Up and Go Test   TUG Normal TUG    Normal TUG (seconds) 21.29   22.18 sec and 20.4 sec without AD                    PT Education - 03/29/21 0951     Education Details PT plan of care. Recommended getting cane with quad tip. Will reach out to Dr. Delice Lesch to ask about pain management referral as pt reports she has not seen and from Dr. Amparo Bristol response it sounded like she was. Will also request OT eval  Person(s) Educated  Patient;Spouse    Methods Explanation    Comprehension Verbalized understanding              PT Short Term Goals - 03/29/21 0953       PT SHORT TERM GOAL #1   Title Pt will be independent with HEP for strengthening and balance to continue gains on own.    Time 4    Period Weeks    Status New    Target Date 04/20/21      PT SHORT TERM GOAL #2   Title Pt will ambulate >500' without AD on level surfaces independently for improved household and short community distances.    Time 4    Period Weeks    Status New    Target Date 04/20/21      PT SHORT TERM GOAL #3   Title Functional testing will be completed at follow-up visit and LTGs updated.    Baseline 03/29/21 PT completed 5 x sit to stand, gait speed and TUG    Time 4    Period Weeks    Status Achieved    Target Date 04/20/21               PT Long Term Goals - 03/29/21 0954       PT LONG TERM GOAL #1   Title Pt will ambulate >500' on varied surfaces with cane supervision for improved short community distances. (LTGs due 05/12/21)    Time 8    Period Weeks    Status New    Target Date 05/12/21      PT LONG TERM GOAL #2   Title Pt will decrease 5 x sit to stand from 30.47 sec to <25 sec for improved balance and functional strength.    Baseline 03/29/21 30.47 sec without hands from chair.    Time 8    Period Weeks    Status New    Target Date 05/12/21      PT LONG TERM GOAL #3   Title Pt will increase gait speed from 0.53m/s to >0.56m/s for improved community mobility.    Baseline 03/29/21 0.86m/s    Time 8    Period Weeks    Status New    Target Date 05/12/21      PT LONG TERM GOAL #4   Title Pt will decrease TUG from 21.29 sec to <17 sec for improved balance and functional mobility.    Baseline 03/29/21 21.29 sec without AD    Time 8    Period Weeks    Status New    Target Date 05/12/21                   Plan - 03/29/21 1000     Clinical Impression Statement PT was able to further assess  functional mobility today with no seizures. Pt was reporting pain in radiating down left leg but does not tolerate any hands on. PT not specifically going to address due to pain inducing seizures and will recommend she refer to pain management for this as Dr. Delice Lesch had suggested in response from last visit. Will incorporate ways to decrease pain such as adding assisted device at this time of cane. Pt steadier with cane and picked up on sequencing well. She also reported some chest pain but vitals good. Reports she is following with cardiology and just turned in monitor. Pt's gait speed of 0.22m/s indicates decreased safety with community mobility. Pt is fall risk based on  5 x sit to stand of 30.47 sec and TUG of 21.29 sec. Pt will benefit from skilled PT to focus on improving strength, balance and functional mobility with functional activities. Updated LTGs.    Personal Factors and Comorbidities Comorbidity 3+;Behavior Pattern    Comorbidities anemia, anxiety, depression, HTN, HLD, migraine,    Examination-Activity Limitations Stairs;Locomotion Level;Transfers    Examination-Participation Restrictions Community Activity;Occupation;Meal Prep;Cleaning;Laundry    Stability/Clinical Decision Making Evolving/Moderate complexity    Rehab Potential Fair    PT Frequency 1x / week    PT Duration 8 weeks    PT Treatment/Interventions DME Instruction;Gait training;Stair training;Functional mobility training;Therapeutic activities;Therapeutic exercise;Balance training;Neuromuscular re-education;Passive range of motion;Patient/family education    PT Next Visit Plan Did OT referral come back. Is Dr. Delice Lesch going to get pain management set up? Continue gait training with cane with quad tip. If she got one be sure it is adjusted correctly. Functional strengthening with sit to stands, step-ups. Balance training.    Recommended Other Services OT    Consulted and Agree with Plan of Care Patient;Family member/caregiver              Patient will benefit from skilled therapeutic intervention in order to improve the following deficits and impairments:  Abnormal gait, Decreased mobility, Decreased strength, Impaired sensation, Decreased knowledge of use of DME, Decreased balance  Visit Diagnosis: Other abnormalities of gait and mobility  Unsteadiness on feet  Muscle weakness (generalized)     Problem List Patient Active Problem List   Diagnosis Date Noted   Left-sided weakness 01/31/2021   Gout 01/31/2021   Urinary frequency 05/07/2017   Abdominal bloating 05/07/2017   Back pain 03/21/2017   Tachycardia 02/28/2017   Preventative health care 11/04/2016   Hoarseness 04/15/2016   Dysphagia 04/15/2016   Atypical chest pain 04/13/2016   Dysuria 03/04/2016   Hematuria 03/04/2016   Palpitations 01/30/2016   History of shingles 01/22/2016   Eczema 08/22/2015   Hyperlipidemia, mixed 08/22/2015   Hyperglycemia 01/15/2015   Lumbar radiculopathy 11/19/2014   GERD (gastroesophageal reflux disease) 09/05/2014   Noncompliance with medications 08/11/2014   Constipation 12/27/2013   Pain in joint, shoulder region 10/13/2013   Shoulder pain 09/23/2013   Numbness and tingling in right hand 09/23/2013   Jaw pain 03/26/2013   Neck pain 05/17/2012   Seizure disorder ([place]) 02/21/2012   Non compliance w medication regimen 02/21/2012   Essential hypertension 09/06/2011   Abdominal pain 06/20/2011   Hemorrhoids 06/20/2011   Hx of adenomatous colonic polyps 06/13/2011   Tubular adenoma of colon 05/2011   Musculoskeletal chest pain 04/23/2011   Radiculopathy of leg 02/02/2011   History of pseudoseizure 07/10/2010   Anxiety and depression 12/29/2009   TINEA PEDIS 06/02/2009   Exertional dyspnea 01/13/2008   MICROSCOPIC HEMATURIA 06/24/2007   WEIGHT LOSS 04/28/2007   Anemia 03/10/2007   Migraine 03/10/2007   ARTHRITIS 03/10/2007   SYNCOPE 03/10/2007   Menometrorrhagia 2006    Electa Sniff, PT,  DPT, NCS 03/29/2021, 10:07 AM  [facility] [address] [place], [place], [address] Phone: [phone]   Fax:  [phone]  Name: Ruth Bell MRN: [id number] Date of Birth: 11-29-1960

## 2021-03-30 ENCOUNTER — Telehealth: Payer: Self-pay | Admitting: Cardiology

## 2021-03-30 DIAGNOSIS — G40909 Epilepsy, unspecified, not intractable, without status epilepticus: Secondary | ICD-10-CM

## 2021-03-30 NOTE — Telephone Encounter (Signed)
Referral placed at this time

## 2021-03-30 NOTE — Telephone Encounter (Signed)
Spoke to the patient just now and they are requesting a referral to be placed for the patient to see Dr. Felecia Shelling with [facility]. I advised that I would reach out to Dr. Bettina Gavia to make sure that this was fine and then would place the referral if he was agreeable.

## 2021-03-30 NOTE — Addendum Note (Signed)
Addended by: Resa Miner I on: 03/30/2021 10:47 AM   Modules accepted: Orders

## 2021-03-30 NOTE — Telephone Encounter (Signed)
Patient called in to say that she needs a referral from our office to be sent over to [facility]. Please advise

## 2021-04-04 ENCOUNTER — Other Ambulatory Visit: Payer: Self-pay | Admitting: Family Medicine

## 2021-04-04 DIAGNOSIS — R52 Pain, unspecified: Secondary | ICD-10-CM

## 2021-04-07 ENCOUNTER — Telehealth: Payer: Self-pay

## 2021-04-07 ENCOUNTER — Ambulatory Visit: Payer: 59

## 2021-04-07 ENCOUNTER — Other Ambulatory Visit: Payer: Self-pay

## 2021-04-07 VITALS — BP 142/84 | HR 84

## 2021-04-07 DIAGNOSIS — M6281 Muscle weakness (generalized): Secondary | ICD-10-CM

## 2021-04-07 DIAGNOSIS — R2681 Unsteadiness on feet: Secondary | ICD-10-CM

## 2021-04-07 DIAGNOSIS — R2689 Other abnormalities of gait and mobility: Secondary | ICD-10-CM | POA: Diagnosis not present

## 2021-04-07 DIAGNOSIS — M79602 Pain in left arm: Secondary | ICD-10-CM

## 2021-04-07 DIAGNOSIS — M25512 Pain in left shoulder: Secondary | ICD-10-CM

## 2021-04-07 NOTE — Therapy (Signed)
[facility] [address] [place], [place], [address] Phone: [phone]   Fax:  [phone]  Physical Therapy Treatment  Patient Details  Name: Ruth Bell MRN: [id number] Date of Birth: 1960-03-29 Referring Provider (PT): Ellouise Newer   Encounter Date: 04/07/2021   PT End of Session - 04/07/21 0805     Visit Number 3    Number of Visits 8    Date for PT Re-Evaluation 05/12/21    Authorization Type [employer]    PT Start Time 0803    PT Stop Time 0848    PT Time Calculation (min) 45 min    Equipment Utilized During Treatment --    Activity Tolerance Patient limited by pain    Behavior During Therapy --   tearful at times, seizure like activity where nodded off            Past Medical History:  Diagnosis Date   Anemia    Anxiety    Arm skin lesion, left 09/05/2014   Constipation 12/27/2013   Depression    Dysphagia 04/15/2016   Eczema 08/22/2015   GERD (gastroesophageal reflux disease)    Hematuria 03/04/2016   History of shingles 01/22/2016   Hyperglycemia 01/15/2015   Hyperlipidemia, mixed 08/22/2015   Hypertension    Menometrorrhagia 2006   Migraine, unspecified, without mention of intractable migraine without mention of status migrainosus    Preventative health care 11/04/2016   Pseudoseizures ([place])    Seizures ([place])    history of pseudoseizure disorder, last last seizure 3 wks, keppra inc in dosage   Tubular adenoma of colon 05/2011   Uterine leiomyoma 2006    Past Surgical History:  Procedure Laterality Date   ABDOMINAL HYSTERECTOMY  04/06/04   APPENDECTOMY     BILATERAL SALPINGOOPHORECTOMY  04/06/04   CARDIAC EVENT MONITOR  02/2016   Mostly sinus bradycardia and sinus rhythm.  Rare PVCs and PACs.  No arrhythmias.  Symptoms of chest pain and fluttering as well as "passed out spell" noted with normal sinus rhythm.   MASS EXCISION Left 06/02/2015   Procedure: EXCISION LEFT ARM MASS;   Surgeon: Autumn Messing III, MD;  Location: [facility];  Service: General;  Laterality: Left;   NM MYOVIEW LTD  04/2016   Reached heart rate of 127 bpm with Lexiscan.  EF 60 to 65%.  LOW RISK.  No ischemia or infarction.   ROTATOR CUFF REPAIR     TRANSTHORACIC ECHOCARDIOGRAM  01/2016   F 55-6%.  No R WMA.  GR 1 DD.  Normal valves.    Vitals:   04/07/21 0805  BP: (!) 142/84  Pulse: 84     Subjective Assessment - 04/07/21 0805     Subjective Pt reports that she is hurting more today from low back down in to left thigh. Had MRI last night and did not rest well last night. Pt got her cane with quad tip.    Patient is accompained by: Family member   husband   Pertinent History anemia, anxiety, depression, HTN, HLD, migraine- stress triggered.    Patient Stated Goals Pt wants to be able to go to work.    Currently in Pain? Yes    Pain Score 9     Pain Location Back    Pain Orientation Left    Pain Descriptors / Indicators Aching    Pain Onset More than a month ago    Pain Frequency Constant    Aggravating  Factors  being up on leg bothers her more    Pain Score 8    Pain Location Shoulder    Pain Orientation Left    Pain Descriptors / Indicators Aching    Pain Radiating Towards down to elbow    Pain Onset More than a month ago    Pain Frequency Constant    Aggravating Factors  movement    Pain Relieving Factors icy hot                               [facility] Adult PT Treatment/Exercise - 04/07/21 0808       Ambulation/Gait   Ambulation/Gait Yes    Ambulation/Gait Assistance 5: Supervision    Ambulation/Gait Assistance Details Verbal cues for sequencing.    Ambulation Distance (Feet) 115 Feet    Assistive device Straight cane   with quad tip   Gait Pattern Step-through pattern;Antalgic    Ambulation Surface Level;Indoor      Exercises   Exercises Other Exercises    Other Exercises  Seated exercises with pillow behind back: Left ankle pumps x 10.  Pt reported pain shooting from ankle up to knee. Left heel slide x 10. Pt reported popping in knee with pain and PT did palpate some crepitus with returning with extension. Tender to palpation to lateral edge of superior patella. Patellar mobility appeared normal with medial/lateral glides. PT added moist heat to low back during exercises to try to help more with pain and relaxing. Left LAQ x 10. Isometric ball squeezes 5 x 2 with 5 sec holds. Pt was cued to try to engage core with this. Also cued to focus on breathing throughout exercises. Pt reported heat felt good but thigh still very painful.      Modalities   Modalities Moist Heat      Moist Heat Therapy   Number Minutes Moist Heat 15 Minutes    Moist Heat Location Lumbar Spine                     PT Education - 04/07/21 0858     Education Details Started on initial seated exercises for ROM, gentle strengthening of left leg. Instructed to sit in supportive chair with pillow behind back. If patient using heating pad at home to be careful not to fall asleep on it and burn herself.    Person(s) Educated Patient;Spouse    Methods Explanation;Demonstration;Handout    Comprehension Verbalized understanding;Returned demonstration              PT Short Term Goals - 03/29/21 0953       PT SHORT TERM GOAL #1   Title Pt will be independent with HEP for strengthening and balance to continue gains on own.    Time 4    Period Weeks    Status New    Target Date 04/20/21      PT SHORT TERM GOAL #2   Title Pt will ambulate >500' without AD on level surfaces independently for improved household and short community distances.    Time 4    Period Weeks    Status New    Target Date 04/20/21      PT SHORT TERM GOAL #3   Title Functional testing will be completed at follow-up visit and LTGs updated.    Baseline 03/29/21 PT completed 5 x sit to stand, gait speed and TUG    Time 4  Period Weeks    Status Achieved    Target Date  04/20/21               PT Long Term Goals - 03/29/21 0954       PT LONG TERM GOAL #1   Title Pt will ambulate >500' on varied surfaces with cane supervision for improved short community distances. (LTGs due 05/12/21)    Time 8    Period Weeks    Status New    Target Date 05/12/21      PT LONG TERM GOAL #2   Title Pt will decrease 5 x sit to stand from 30.47 sec to <25 sec for improved balance and functional strength.    Baseline 03/29/21 30.47 sec without hands from chair.    Time 8    Period Weeks    Status New    Target Date 05/12/21      PT LONG TERM GOAL #3   Title Pt will increase gait speed from 0.46m/s to >0.19m/s for improved community mobility.    Baseline 03/29/21 0.72m/s    Time 8    Period Weeks    Status New    Target Date 05/12/21      PT LONG TERM GOAL #4   Title Pt will decrease TUG from 21.29 sec to <17 sec for improved balance and functional mobility.    Baseline 03/29/21 21.29 sec without AD    Time 8    Period Weeks    Status New    Target Date 05/12/21                   Plan - 04/07/21 0901     Clinical Impression Statement Pt was limited by reports of pain in low back and down left leg. Reports not sleeping well last night. PT adjusted her cane to proper height and she did have correct sequence but continued to report increased pain. Focused on establishing seated ROM/gentle strengthening for LLE that pt can tolerate.    Personal Factors and Comorbidities Comorbidity 3+;Behavior Pattern    Comorbidities anemia, anxiety, depression, HTN, HLD, migraine,    Examination-Activity Limitations Stairs;Locomotion Level;Transfers    Examination-Participation Restrictions Community Activity;Occupation;Meal Prep;Cleaning;Laundry    Stability/Clinical Decision Making Evolving/Moderate complexity    Rehab Potential Fair    PT Frequency 1x / week    PT Duration 8 weeks    PT Treatment/Interventions DME Instruction;Gait training;Stair  training;Functional mobility training;Therapeutic activities;Therapeutic exercise;Balance training;Neuromuscular re-education;Passive range of motion;Patient/family education    PT Next Visit Plan Did OT referral come back. Is Dr. Delice Lesch going to get pain management set up? How are seated exercises going? Continue gait training with cane with quad tip.  If pain doing better try more functional strengthening with sit to stands, step-ups. Balance training. PT HAS PSYCHOGENIC SEIZURES THAT SHE REPORTS ARE PAIN INDUCED SO PROCEED SLOWLY.    Consulted and Agree with Plan of Care Patient;Family member/caregiver             Patient will benefit from skilled therapeutic intervention in order to improve the following deficits and impairments:  Abnormal gait, Decreased mobility, Decreased strength, Impaired sensation, Decreased knowledge of use of DME, Decreased balance  Visit Diagnosis: Other abnormalities of gait and mobility  Muscle weakness (generalized)     Problem List Patient Active Problem List   Diagnosis Date Noted   Left-sided weakness 01/31/2021   Gout 01/31/2021   Urinary frequency 05/07/2017   Abdominal bloating 05/07/2017   Back  pain 03/21/2017   Tachycardia 02/28/2017   Preventative health care 11/04/2016   Hoarseness 04/15/2016   Dysphagia 04/15/2016   Atypical chest pain 04/13/2016   Dysuria 03/04/2016   Hematuria 03/04/2016   Palpitations 01/30/2016   History of shingles 01/22/2016   Eczema 08/22/2015   Hyperlipidemia, mixed 08/22/2015   Hyperglycemia 01/15/2015   Lumbar radiculopathy 11/19/2014   GERD (gastroesophageal reflux disease) 09/05/2014   Noncompliance with medications 08/11/2014   Constipation 12/27/2013   Pain in joint, shoulder region 10/13/2013   Shoulder pain 09/23/2013   Numbness and tingling in right hand 09/23/2013   Jaw pain 03/26/2013   Neck pain 05/17/2012   Seizure disorder ([place]) 02/21/2012   Non compliance w medication regimen  02/21/2012   Essential hypertension 09/06/2011   Abdominal pain 06/20/2011   Hemorrhoids 06/20/2011   Hx of adenomatous colonic polyps 06/13/2011   Tubular adenoma of colon 05/2011   Musculoskeletal chest pain 04/23/2011   Radiculopathy of leg 02/02/2011   History of pseudoseizure 07/10/2010   Anxiety and depression 12/29/2009   TINEA PEDIS 06/02/2009   Exertional dyspnea 01/13/2008   MICROSCOPIC HEMATURIA 06/24/2007   WEIGHT LOSS 04/28/2007   Anemia 03/10/2007   Migraine 03/10/2007   ARTHRITIS 03/10/2007   SYNCOPE 03/10/2007   Menometrorrhagia 2006    Electa Sniff, PT, DPT, NCS 04/07/2021, 9:04 AM  [facility] [address] [place], [place], [address] Phone: [phone]   Fax:  [phone]  Name: MYSTIC LABO MRN: [id number] Date of Birth: 1960-07-29

## 2021-04-07 NOTE — Telephone Encounter (Signed)
Heather, pls order OT as noted below, also pls refer to Pain Management. Thank you!

## 2021-04-07 NOTE — Telephone Encounter (Signed)
Dr. Delice Lesch, Ruth Bell is being seen  by PT for left sided weakness. She continues to report it was from a CVA but imaging did not show this.  The patient would benefit from OT evaluation for left shoulder and arm pain/weakness.   If you agree, please place an order in [facility]que in [facility] or fax the order to [phone]. Per our last conversation, you mentioned pain management was seeing and pt and husband deny every seeing them. Would you consider pain management referral to assist as reports her seizures are pain induced.  Thank you, [name], PT, DPT, [facility] [address] [place], [place]  [address] Phone:  [phone] Fax:  [phone]

## 2021-04-07 NOTE — Telephone Encounter (Signed)
Order placed in epic

## 2021-04-07 NOTE — Patient Instructions (Signed)
Access Code: [email or web address] URL: [email or web address] Date: 04/07/2021 Prepared by: [name]  Exercises Seated Heel Slide - 2 x daily - 5 x weekly - 2 sets - 10 reps Seated Long Arc Quad - 2 x daily - 5 x weekly - 2 sets - 10 reps Seated Ankle Pumps - 2 x daily - 5 x weekly - 2 sets - 10 reps Seated Hip Adduction Isometrics with Ball - 2 x daily - 7 x weekly - 2 sets - 10 reps - 5 sec hold

## 2021-04-13 ENCOUNTER — Ambulatory Visit: Payer: 59

## 2021-04-13 ENCOUNTER — Ambulatory Visit: Payer: 59 | Admitting: Occupational Therapy

## 2021-04-13 ENCOUNTER — Other Ambulatory Visit: Payer: Self-pay

## 2021-04-13 VITALS — BP 130/82 | HR 86

## 2021-04-13 DIAGNOSIS — R2681 Unsteadiness on feet: Secondary | ICD-10-CM

## 2021-04-13 DIAGNOSIS — M25512 Pain in left shoulder: Secondary | ICD-10-CM

## 2021-04-13 DIAGNOSIS — R278 Other lack of coordination: Secondary | ICD-10-CM

## 2021-04-13 DIAGNOSIS — M79602 Pain in left arm: Secondary | ICD-10-CM

## 2021-04-13 DIAGNOSIS — R2689 Other abnormalities of gait and mobility: Secondary | ICD-10-CM

## 2021-04-13 DIAGNOSIS — M6281 Muscle weakness (generalized): Secondary | ICD-10-CM

## 2021-04-13 NOTE — Therapy (Signed)
[facility] [address] [place], [place], [address] Phone: [phone]   Fax:  [phone]  Physical Therapy Treatment  Patient Details  Name: Ruth Bell MRN: [id number] Date of Birth: Oct 07, 1960 Referring Provider (PT): Ellouise Newer   Encounter Date: 04/13/2021   PT End of Session - 04/13/21 0848     Visit Number 4    Number of Visits 8    Date for PT Re-Evaluation 05/12/21    Authorization Type [employer]    PT Start Time 0845    PT Stop Time 0929    PT Time Calculation (min) 44 min    Equipment Utilized During Treatment Gait belt    Activity Tolerance Patient limited by pain    Behavior During Therapy --             Past Medical History:  Diagnosis Date   Anemia    Anxiety    Arm skin lesion, left 09/05/2014   Constipation 12/27/2013   Depression    Dysphagia 04/15/2016   Eczema 08/22/2015   GERD (gastroesophageal reflux disease)    Hematuria 03/04/2016   History of shingles 01/22/2016   Hyperglycemia 01/15/2015   Hyperlipidemia, mixed 08/22/2015   Hypertension    Menometrorrhagia 2006   Migraine, unspecified, without mention of intractable migraine without mention of status migrainosus    Preventative health care 11/04/2016   Pseudoseizures ([place])    Seizures ([place])    history of pseudoseizure disorder, last last seizure 3 wks, keppra inc in dosage   Tubular adenoma of colon 05/2011   Uterine leiomyoma 2006    Past Surgical History:  Procedure Laterality Date   ABDOMINAL HYSTERECTOMY  04/06/04   APPENDECTOMY     BILATERAL SALPINGOOPHORECTOMY  04/06/04   CARDIAC EVENT MONITOR  02/2016   Mostly sinus bradycardia and sinus rhythm.  Rare PVCs and PACs.  No arrhythmias.  Symptoms of chest pain and fluttering as well as "passed out spell" noted with normal sinus rhythm.   MASS EXCISION Left 06/02/2015   Procedure: EXCISION LEFT ARM MASS;  Surgeon: Autumn Messing III, MD;  Location: [facility];  Service: General;  Laterality: Left;   NM MYOVIEW LTD  04/2016   Reached heart rate of 127 bpm with Lexiscan.  EF 60 to 65%.  LOW RISK.  No ischemia or infarction.   ROTATOR CUFF REPAIR     TRANSTHORACIC ECHOCARDIOGRAM  01/2016   F 55-6%.  No R WMA.  GR 1 DD.  Normal valves.    Vitals:   04/13/21 0849  BP: 130/82  Pulse: 86  SpO2: 99%     Subjective Assessment - 04/13/21 0849     Subjective Pt reports that left leg is hurting her some as she was on it more yesterday. She tried to do some cooking and cleaning. Still having some of the chest pain on left. Pt reports she had a fall on Tuesday when left leg got weak. Was able to get up on own and denies any injury.    Patient is accompained by: Family member   husband   Pertinent History anemia, anxiety, depression, HTN, HLD, migraine- stress triggered.    Patient Stated Goals Pt wants to be able to go to work.    Currently in Pain? Yes    Pain Score 7     Pain Location Back    Pain Orientation Left;Lower    Pain Descriptors / Indicators Burning    Pain  Type Chronic pain    Pain Onset More than a month ago    Pain Frequency Constant    Aggravating Factors  being up on leg more    Pain Relieving Factors resting and elevating leg    Pain Onset More than a month ago                               [facility] Adult PT Treatment/Exercise - 04/13/21 0857       Ambulation/Gait   Ambulation/Gait Yes    Ambulation/Gait Assistance 5: Supervision    Ambulation/Gait Assistance Details walked in to clinic with cane but between activities she chose not to use    Assistive device Straight cane;None   with quad tip   Gait Pattern Step-through pattern;Decreased stance time - left;Decreased arm swing - left;Decreased hip/knee flexion - left;Antalgic    Ambulation Surface Level;Indoor      Neuro Re-ed    Neuro Re-ed Details  Exercises at counter: side stepping with BUE support with verbal cues to relax left shoulder 10'  x 6, stepping over 3 2" bolsters with 1 UE support leading with LLE to work on improving foot clearance first in step-to pattern x 6 bouts then switched to reciprocal pattern. Pt reported some increased pain when left leg posterior. Was cued to try to engage core to help support back and she reported it helped some. Performed 4 bouts with reciprocal pattern. Alternating taps on cone with 1 UE support x 10. Pt did report some pain on left with hip flexion in low back area. Took seated rest breaks between each activity. HR=86 after and regular                     PT Education - 04/13/21 0941     Education Details Added counter exercises to HEP with instruction not to do both seated and standing but instead base on how she was feeling    Person(s) Educated Patient    Methods Explanation;Demonstration;Handout    Comprehension Verbalized understanding;Returned demonstration              PT Short Term Goals - 03/29/21 0953       PT SHORT TERM GOAL #1   Title Pt will be independent with HEP for strengthening and balance to continue gains on own.    Time 4    Period Weeks    Status New    Target Date 04/20/21      PT SHORT TERM GOAL #2   Title Pt will ambulate >500' without AD on level surfaces independently for improved household and short community distances.    Time 4    Period Weeks    Status New    Target Date 04/20/21      PT SHORT TERM GOAL #3   Title Functional testing will be completed at follow-up visit and LTGs updated.    Baseline 03/29/21 PT completed 5 x sit to stand, gait speed and TUG    Time 4    Period Weeks    Status Achieved    Target Date 04/20/21               PT Long Term Goals - 03/29/21 0954       PT LONG TERM GOAL #1   Title Pt will ambulate >500' on varied surfaces with cane supervision for improved short community distances. (LTGs due 05/12/21)  Time 8    Period Weeks    Status New    Target Date 05/12/21      PT LONG TERM GOAL  #2   Title Pt will decrease 5 x sit to stand from 30.47 sec to <25 sec for improved balance and functional strength.    Baseline 03/29/21 30.47 sec without hands from chair.    Time 8    Period Weeks    Status New    Target Date 05/12/21      PT LONG TERM GOAL #3   Title Pt will increase gait speed from 0.24m/s to >0.27m/s for improved community mobility.    Baseline 03/29/21 0.48m/s    Time 8    Period Weeks    Status New    Target Date 05/12/21      PT LONG TERM GOAL #4   Title Pt will decrease TUG from 21.29 sec to <17 sec for improved balance and functional mobility.    Baseline 03/29/21 21.29 sec without AD    Time 8    Period Weeks    Status New    Target Date 05/12/21                   Plan - 04/13/21 0942     Clinical Impression Statement Pt was able to push herself more with functional activities today. Did provide breaks as needed throughout session. Pt demonstrated increased left hip/knee flexion with stepping tasks today. Does return to antalgic pattern when not performing activity with decreased left stance time. Vitals remained stable throughout session.    Personal Factors and Comorbidities Comorbidity 3+;Behavior Pattern    Comorbidities anemia, anxiety, depression, HTN, HLD, migraine,    Examination-Activity Limitations Stairs;Locomotion Level;Transfers    Examination-Participation Restrictions Community Activity;Occupation;Meal Prep;Cleaning;Laundry    Stability/Clinical Decision Making Evolving/Moderate complexity    Rehab Potential Fair    PT Frequency 1x / week    PT Duration 8 weeks    PT Treatment/Interventions DME Instruction;Gait training;Stair training;Functional mobility training;Therapeutic activities;Therapeutic exercise;Balance training;Neuromuscular re-education;Passive range of motion;Patient/family education    PT Next Visit Plan How did new counter exercises go? Did they speak to cardiologist? Did pain management contact them yet? Check  STGs and continue to work on more functional activities with breaks as needed.             Patient will benefit from skilled therapeutic intervention in order to improve the following deficits and impairments:  Abnormal gait, Decreased mobility, Decreased strength, Impaired sensation, Decreased knowledge of use of DME, Decreased balance  Visit Diagnosis: Other abnormalities of gait and mobility  Muscle weakness (generalized)  Unsteadiness on feet     Problem List Patient Active Problem List   Diagnosis Date Noted   Left-sided weakness 01/31/2021   Gout 01/31/2021   Urinary frequency 05/07/2017   Abdominal bloating 05/07/2017   Back pain 03/21/2017   Tachycardia 02/28/2017   Preventative health care 11/04/2016   Hoarseness 04/15/2016   Dysphagia 04/15/2016   Atypical chest pain 04/13/2016   Dysuria 03/04/2016   Hematuria 03/04/2016   Palpitations 01/30/2016   History of shingles 01/22/2016   Eczema 08/22/2015   Hyperlipidemia, mixed 08/22/2015   Hyperglycemia 01/15/2015   Lumbar radiculopathy 11/19/2014   GERD (gastroesophageal reflux disease) 09/05/2014   Noncompliance with medications 08/11/2014   Constipation 12/27/2013   Pain in joint, shoulder region 10/13/2013   Shoulder pain 09/23/2013   Numbness and tingling in right hand 09/23/2013   Jaw pain 03/26/2013  Neck pain 05/17/2012   Seizure disorder ([place]) 02/21/2012   Non compliance w medication regimen 02/21/2012   Essential hypertension 09/06/2011   Abdominal pain 06/20/2011   Hemorrhoids 06/20/2011   Hx of adenomatous colonic polyps 06/13/2011   Tubular adenoma of colon 05/2011   Musculoskeletal chest pain 04/23/2011   Radiculopathy of leg 02/02/2011   History of pseudoseizure 07/10/2010   Anxiety and depression 12/29/2009   TINEA PEDIS 06/02/2009   Exertional dyspnea 01/13/2008   MICROSCOPIC HEMATURIA 06/24/2007   WEIGHT LOSS 04/28/2007   Anemia 03/10/2007   Migraine 03/10/2007   ARTHRITIS  03/10/2007   SYNCOPE 03/10/2007   Menometrorrhagia 2006    Electa Sniff, PT, DPT, NCS 04/13/2021, 9:47 AM  [facility] [address] [place], [place], [address] Phone: [phone]   Fax:  [phone]  Name: Ruth Bell MRN: [id number] Date of Birth: Jul 11, 1960

## 2021-04-13 NOTE — Patient Instructions (Signed)
Access Code: [email or web address] URL: [email or web address] Date: 04/13/2021 Prepared by: [name]  Exercises Seated Heel Slide - 2 x daily - 5 x weekly - 2 sets - 10 reps Seated Long Arc Quad - 2 x daily - 5 x weekly - 2 sets - 10 reps Seated Ankle Pumps - 2 x daily - 5 x weekly - 2 sets - 10 reps Seated Hip Adduction Isometrics with Ball - 2 x daily - 7 x weekly - 2 sets - 10 reps - 5 sec hold Standing March with Counter Support - 2 x daily - 5 x weekly - 1 sets - 10 reps Side Stepping with Counter Support - 2 x daily - 5 x weekly - 1 sets - 2-3 reps Forward/backwards walk at counter - 2 x daily - 5 x weekly - 1 sets - 2-3 reps

## 2021-04-13 NOTE — Therapy (Signed)
[facility] [address] [place], [place], [address] Phone: [phone]   Fax:  [phone]  Occupational Therapy Treatment  Patient Details  Name: Ruth Bell MRN: [id number] Date of Birth: [birth year] Referring Provider (OT): Dr Delice Lesch   Encounter Date: 04/13/2021   OT End of Session - 04/13/21 1127     Visit Number 1    Number of Visits 13    Date for OT Re-Evaluation 07/06/21    Authorization Time Period 12 weeks    Authorization - Visit Number 1    Authorization - Number of Visits 30   combined with PT   OT Start Time 2774    OT Stop Time 1013    OT Time Calculation (min) 38 min             Past Medical History:  Diagnosis Date   Anemia    Anxiety    Arm skin lesion, left 09/05/2014   Constipation 12/27/2013   Depression    Dysphagia 04/15/2016   Eczema 08/22/2015   GERD (gastroesophageal reflux disease)    Hematuria 03/04/2016   History of shingles 01/22/2016   Hyperglycemia 01/15/2015   Hyperlipidemia, mixed 08/22/2015   Hypertension    Menometrorrhagia 2006   Migraine, unspecified, without mention of intractable migraine without mention of status migrainosus    Preventative health care 11/04/2016   Pseudoseizures ([place])    Seizures ([place])    history of pseudoseizure disorder, last last seizure 3 wks, keppra inc in dosage   Tubular adenoma of colon 05/2011   Uterine leiomyoma 2006    Past Surgical History:  Procedure Laterality Date   ABDOMINAL HYSTERECTOMY  04/06/04   APPENDECTOMY     BILATERAL SALPINGOOPHORECTOMY  04/06/04   CARDIAC EVENT MONITOR  02/2016   Mostly sinus bradycardia and sinus rhythm.  Rare PVCs and PACs.  No arrhythmias.  Symptoms of chest pain and fluttering as well as "passed out spell" noted with normal sinus rhythm.   MASS EXCISION Left 06/02/2015   Procedure: EXCISION LEFT ARM MASS;  Surgeon: Autumn Messing III, MD;  Location: [facility];  Service: General;   Laterality: Left;   NM MYOVIEW LTD  04/2016   Reached heart rate of 127 bpm with Lexiscan.  EF 60 to 65%.  LOW RISK.  No ischemia or infarction.   ROTATOR CUFF REPAIR     TRANSTHORACIC ECHOCARDIOGRAM  01/2016   F 55-6%.  No R WMA.  GR 1 DD.  Normal valves.    There were no vitals filed for this visit.   Subjective Assessment - 04/13/21 1119     Subjective  Pt reports left shoulder pain    Pertinent History Pt was referred for left sided weakness and psychogenic nonepileptic seizure. She reports she had a stroke on 11/6 but imaging was all negative and had left sided weakness throughout including face. She reports still having some numbness. Pt reports that this is different than the seizure like activity she has had in the past. She reports 1 fall when this occurred as left leg gave out on her. She denies any vision changes.       Patient is accompained by: Family member   husband    Pertinent History anemia, anxiety, depression, HTN, HLD, migraine- stress triggered . hx of rotator cuff tear bilaterally, pt reports hx of LUE fx 1 year ago    Limitations seizures in reponse to pain    Patient Stated Goals Get my  arm back and to be able to do things on my own and not be in so much pain and be able    Currently in Pain? Yes    Pain Score 9     Pain Location Shoulder    Pain Orientation Left    Pain Descriptors / Indicators Aching    Pain Type Chronic pain    Pain Onset More than a month ago    Pain Frequency Constant    Aggravating Factors  movement    Pain Relieving Factors inactivity                [facility] OT Assessment - 04/13/21 0950       Assessment   Medical Diagnosis left sided weakness, psychogenic nonepileptic seizure    Referring Provider (OT) Dr Delice Lesch    Onset Date/Surgical Date 01/22/21   hospitalized for stoke like activity however imaging was negative   Hand Dominance Right    Prior Therapy a long time ago had PT for neck      Precautions   Precautions Fall     Precaution Comments Pt will have seizures in resonse to pain      Balance Screen   Has the patient fallen in the past 6 months Yes    How many times? 1    Has the patient had a decrease in activity level because of a fear of falling?  Yes    Is the patient reluctant to leave their home because of a fear of falling?  No      Home  Environment   Family/patient expects to be discharged to: Private residence    Lives With Spouse      Prior Function   Level of [place];Independent with community mobility without device    Vocation Part time employment    Radiographer, therapeutic, Scientist, water quality    Leisure watch TV, walking about a block prior      ADL   Eating/Feeding Modified independent    Grooming Modified independent    Upper Body Bathing Modified independent    Lower Body Bathing Modified independent    Upper Body Dressing Independent    Toilet Transfer Modified independent    Tub/Shower Transfer Minimal assistance    ADL comments Pt  is not using LUE to assist with ADLS consistently      IADL   Shopping Needs to be accompanied on any shopping trip    Light Housekeeping Performs light daily tasks such as dishwashing, bed making    Meal Prep Able to complete simple warm meal prep    Medication Management Is responsible for taking medication in correct dosages at correct time      Mobility   Mobility Status Independent   with quad cane     Written Expression   Dominant Hand Right      Cognition   Overall Cognitive Status Cognition to be further assessed in functional context PRN      Sensation   Light Touch Impaired by gross assessment   tingling in L hand, numbness     Coordination   Gross Motor Movements are Fluid and Coordinated No    Fine Motor Movements are Fluid and Coordinated No    9 Hole Peg Test Right;Left    Right 9 Hole Peg Test 19.45    Left 9 Hole Peg Test 45    Coordination impaired for LUE ataxic movements with 9 hole peg test,  symptoms appear amplified  ROM / Strength   AROM / PROM / Strength AROM      AROM   Overall AROM  Deficits    Overall AROM Comments RUE WFLS LUE    AROM Assessment Site Shoulder;Elbow;Forearm    Right/Left Shoulder Left    Left Shoulder Flexion 50 Degrees   limited by pain   Left Shoulder ABduction 45 Degrees    Right/Left Elbow Left    Left Elbow Flexion 110    Left Elbow Extension -10    Right/Left Forearm Left    Left Forearm Pronation --   WFLS   Left Forearm Supination --   WFLS     Hand Function   Right Hand Grip (lbs) 68.1    Left Hand Grip (lbs) 17.6                                OT Short Term Goals - 04/13/21 1134       OT SHORT TERM GOAL #1   Title I with inital HEP    Time 6    Period Weeks    Status New    Target Date 05/25/21      OT SHORT TERM GOAL #2   Title Pt will use LUE as a non dominant assist at least 25% of the time for ADLs with pain no greater than 6/10    Time 6    Period Weeks    Status New      OT SHORT TERM GOAL #3   Title Pt will demonstrate 65 shoulder flexion in prep for functional reach    Baseline 50*    Time 6    Period Weeks    Status New      OT SHORT TERM GOAL #4   Title Pt will demonstrate improved fine motor coordination as evidenced by decreasing 9 hole peg test score to 42 secs or less    Baseline RUE 19.45, LUE 45 secs    Time 6    Period Weeks    Status New               OT Long Term Goals - 04/13/21 1210       OT LONG TERM GOAL #1   Title I with updated HEP    Time 12    Period Weeks    Status New    Target Date 07/06/21      OT LONG TERM GOAL #2   Title Pt will use LUE as a non dominant assist at least 50% of the time for ADLS with pain less than or equal to 4/10    Time 12    Period Weeks    Status New      OT LONG TERM GOAL #3   Title Pt. will demonstrate 75* shoulder flexion for LUE in prep for functional use.    Time 12    Period Weeks    Status New      OT  LONG TERM GOAL #4   Title Pt will increase LUE grip strength by 5 lbs for increased LUE functional use during ADLS    Time 12    Period Weeks    Status New      OT LONG TERM GOAL #5   Title Pt. will demonstrate LUE elbow flexion WFLS for ADLS/IADLs.    Time 12    Period Weeks    Status New  Plan - 04/13/21 1121     Clinical Impression Statement Pt was referred by Ellouise Newer on 1/20 for left sided weakness, psychogenic nonepileptic seizure  PMH: anemia, anxiety, depression, HTN, HLD, migraine, Pt reports hx of bilateral rotator cuff injuries and fall with LUE fx approximately  1 year ago. Pt states she had a CVA however all imaging was negative. Pt presents with the following deficits: pain, decreased strength, decreased ROM, decreaed LUE functional use, decreased functional mobility which impedes performance of ADLs/ IADLS. Pt can benefit from skilled occupational therapy to address these deficits in order to maximize functional I with daily activities.    OT Occupational Profile and History Detailed Assessment- Review of Records and additional review of physical, cognitive, psychosocial history related to current functional performance    Occupational performance deficits (Please refer to evaluation for details): ADL's;IADL's;Rest and Sleep;Leisure;Social Participation;Work    Marketing executive / Function / Physical Skills ADL;UE functional use;Balance;Flexibility;Pain;FMC;Gait;ROM;GMC;Coordination;Sensation;Decreased knowledge of precautions;Decreased knowledge of use of DME;IADL;Dexterity;Mobility;Strength    Rehab Potential Fair    Clinical Decision Making Several treatment options, min-mod task modification necessary    Comorbidities Affecting Occupational Performance: May have comorbidities impacting occupational performance    Modification or Assistance to Complete Evaluation  Min-Moderate modification of tasks or assist with assess necessary to complete eval     OT Frequency 1x / week   Plus eval Pt was scheduled for 8 weeks, may d/c after 8 weeks dependent on progress.   OT Duration 12 weeks    OT Treatment/Interventions Self-care/ADL training;Visual/perceptual remediation/compensation;Ultrasound;Patient/family education;DME and/or AE instruction;Paraffin;Passive range of motion;Balance training;Fluidtherapy;Cryotherapy;Functional Mobility Training;Splinting;Therapeutic activities;Manual Therapy;Therapeutic exercise;Moist Heat;Neuromuscular education    Plan initiate HEP for gentle motion/ functional use             Patient will benefit from skilled therapeutic intervention in order to improve the following deficits and impairments:   Body Structure / Function / Physical Skills: ADL, UE functional use, Balance, Flexibility, Pain, FMC, Gait, ROM, GMC, Coordination, Sensation, Decreased knowledge of precautions, Decreased knowledge of use of DME, IADL, Dexterity, Mobility, Strength       Visit Diagnosis: Muscle weakness (generalized) - Plan: Ot plan of care cert/re-cert  Left shoulder pain, unspecified chronicity - Plan: Ot plan of care cert/re-cert  Unsteadiness on feet - Plan: Ot plan of care cert/re-cert  Left arm pain - Plan: Ot plan of care cert/re-cert  Other abnormalities of gait and mobility - Plan: Ot plan of care cert/re-cert  Other lack of coordination - Plan: Ot plan of care cert/re-cert    Problem List Patient Active Problem List   Diagnosis Date Noted   Left-sided weakness 01/31/2021   Gout 01/31/2021   Urinary frequency 05/07/2017   Abdominal bloating 05/07/2017   Back pain 03/21/2017   Tachycardia 02/28/2017   Preventative health care 11/04/2016   Hoarseness 04/15/2016   Dysphagia 04/15/2016   Atypical chest pain 04/13/2016   Dysuria 03/04/2016   Hematuria 03/04/2016   Palpitations 01/30/2016   History of shingles 01/22/2016   Eczema 08/22/2015   Hyperlipidemia, mixed 08/22/2015   Hyperglycemia 01/15/2015    Lumbar radiculopathy 11/19/2014   GERD (gastroesophageal reflux disease) 09/05/2014   Noncompliance with medications 08/11/2014   Constipation 12/27/2013   Pain in joint, shoulder region 10/13/2013   Shoulder pain 09/23/2013   Numbness and tingling in right hand 09/23/2013   Jaw pain 03/26/2013   Neck pain 05/17/2012   Seizure disorder ([place]) 02/21/2012   Non compliance w medication regimen 02/21/2012   Essential hypertension  09/06/2011   Abdominal pain 06/20/2011   Hemorrhoids 06/20/2011   Hx of adenomatous colonic polyps 06/13/2011   Tubular adenoma of colon 05/2011   Musculoskeletal chest pain 04/23/2011   Radiculopathy of leg 02/02/2011   History of pseudoseizure 07/10/2010   Anxiety and depression 12/29/2009   TINEA PEDIS 06/02/2009   Exertional dyspnea 01/13/2008   MICROSCOPIC HEMATURIA 06/24/2007   WEIGHT LOSS 04/28/2007   Anemia 03/10/2007   Migraine 03/10/2007   ARTHRITIS 03/10/2007   SYNCOPE 03/10/2007   Menometrorrhagia 2006    Cheng Dec, OT 04/13/2021, 12:23 PM  [facility] [address] [place], [place], [address] Phone: [phone]   Fax:  [phone]  Name: TENISE STETLER MRN: [id number] Date of Birth: October 05, 1960

## 2021-04-14 ENCOUNTER — Encounter: Payer: Self-pay | Admitting: Physical Medicine & Rehabilitation

## 2021-04-17 ENCOUNTER — Telehealth: Payer: Self-pay | Admitting: Family Medicine

## 2021-04-17 NOTE — Telephone Encounter (Signed)
Ruth Bell contacted ov and stated pt had a recent stroke and was advised by the therapist to contact provider to see if pain meds could be prescribed. Please advise.

## 2021-04-18 ENCOUNTER — Ambulatory Visit: Payer: 59 | Admitting: Occupational Therapy

## 2021-04-18 ENCOUNTER — Other Ambulatory Visit: Payer: Self-pay

## 2021-04-18 DIAGNOSIS — M6281 Muscle weakness (generalized): Secondary | ICD-10-CM

## 2021-04-18 DIAGNOSIS — R2689 Other abnormalities of gait and mobility: Secondary | ICD-10-CM | POA: Diagnosis not present

## 2021-04-18 DIAGNOSIS — M25512 Pain in left shoulder: Secondary | ICD-10-CM

## 2021-04-18 DIAGNOSIS — M79602 Pain in left arm: Secondary | ICD-10-CM

## 2021-04-18 DIAGNOSIS — R278 Other lack of coordination: Secondary | ICD-10-CM

## 2021-04-18 NOTE — Patient Instructions (Signed)
SCAPULA: Retraction    Hold cane with both hands. Pinch shoulder blades together. Do not shrug shoulders. Hold _5__ seconds. 10___ reps per set, __1-2_ sets per day, _7__ days per week Keep arms low and slide cane along tops of your legs.     Lateral Weight Shift: Upper Trunk Leading   Sit with feet flat on floor. Bring _left___ shoulder, head and arm toward side until forearm/elbow just touches sitting surface. Hold __10__ seconds. Return to upright position by pushing through arm Repeat _10___ times per session. Do __2__ sessions per day.     SITTING: Forward Weight Shift   Sit upright, place both hands on sitting surface. Lean chest forward, keep back straight. Hold _5__ seconds. _10__ reps per set, _2__ sets per day.  Place wedge under hand if wrist range of motion is limited.  Marland Kitchen

## 2021-04-18 NOTE — Therapy (Signed)
[facility] [address] [place], [place], [address] Phone: [phone]   Fax:  [phone]  Occupational Therapy Treatment  Patient Details  Name: Ruth Bell MRN: [id number] Date of Birth: Nov 10, 1960 Referring Provider (OT): Dr Delice Lesch   Encounter Date: 04/18/2021   OT End of Session - 04/18/21 0855     Visit Number 2    Number of Visits 13    Date for OT Re-Evaluation 07/06/21    Authorization Type [employer]    Authorization Time Period 12 weeks    Authorization - Visit Number 2    Authorization - Number of Visits 30    OT Start Time 0848    OT Stop Time 0930    OT Time Calculation (min) 42 min             Past Medical History:  Diagnosis Date   Anemia    Anxiety    Arm skin lesion, left 09/05/2014   Constipation 12/27/2013   Depression    Dysphagia 04/15/2016   Eczema 08/22/2015   GERD (gastroesophageal reflux disease)    Hematuria 03/04/2016   History of shingles 01/22/2016   Hyperglycemia 01/15/2015   Hyperlipidemia, mixed 08/22/2015   Hypertension    Menometrorrhagia 2006   Migraine, unspecified, without mention of intractable migraine without mention of status migrainosus    Preventative health care 11/04/2016   Pseudoseizures ([place])    Seizures ([place])    history of pseudoseizure disorder, last last seizure 3 wks, keppra inc in dosage   Tubular adenoma of colon 05/2011   Uterine leiomyoma 2006    Past Surgical History:  Procedure Laterality Date   ABDOMINAL HYSTERECTOMY  04/06/04   APPENDECTOMY     BILATERAL SALPINGOOPHORECTOMY  04/06/04   CARDIAC EVENT MONITOR  02/2016   Mostly sinus bradycardia and sinus rhythm.  Rare PVCs and PACs.  No arrhythmias.  Symptoms of chest pain and fluttering as well as "passed out spell" noted with normal sinus rhythm.   MASS EXCISION Left 06/02/2015   Procedure: EXCISION LEFT ARM MASS;  Surgeon: Autumn Messing III, MD;  Location: [facility];   Service: General;  Laterality: Left;   NM MYOVIEW LTD  04/2016   Reached heart rate of 127 bpm with Lexiscan.  EF 60 to 65%.  LOW RISK.  No ischemia or infarction.   ROTATOR CUFF REPAIR     TRANSTHORACIC ECHOCARDIOGRAM  01/2016   F 55-6%.  No R WMA.  GR 1 DD.  Normal valves.    There were no vitals filed for this visit.   Subjective Assessment - 04/18/21 0854     Subjective  Pt reports she had an episode at church this weekend    Pertinent History Pt was referred for left sided weakness and psychogenic nonepileptic seizure. She reports she had a stroke on 11/6 but imaging was all negative and had left sided weakness throughout including face. She reports still having some numbness. Pt reports that this is different than the seizure like activity she has had in the past. She reports 1 fall when this occurred as left leg gave out on her. She denies any vision changes.       Patient is accompained by: Family member   husband    Pertinent History anemia, anxiety, depression, HTN, HLD, migraine- stress triggered . hx of rotator cuff tear bilaterally, pt reports hx of LUE fx 1 year ago    Patient Stated Goals Get my  arm back and to be able to do things on my own and not be in so much pain and be able    Currently in Pain? Yes    Pain Score 8     Pain Location Shoulder    Pain Orientation Left    Pain Descriptors / Indicators Aching    Pain Type Chronic pain    Pain Onset More than a month ago    Pain Frequency Constant    Aggravating Factors  movement    Pain Relieving Factors heat               Treatment: Hotpack applied to left shoulder in supine while pt performed AA/ROM elbow flexion/ extension, supination /pronation, finger flexion/ extension as tolerated AA/ROM shoulder abduction then cane exercises for chest press and shoulder abduction in supine, 5-10 reps prior to rest break. Pt required frequent rest breaks and encouragement , ataxic movements present in LUE at various times  during session. Pt amb to and from gym with cane minguard for safety.                   OT Education - 04/18/21 1652     Education Details inital HEP,see pt instructions    Person(s) Educated Patient    Methods Explanation;Demonstration;Verbal cues;Handout    Comprehension Verbalized understanding;Returned demonstration;Verbal cues required              OT Short Term Goals - 04/13/21 1134       OT SHORT TERM GOAL #1   Title I with inital HEP    Time 6    Period Weeks    Status New    Target Date 05/25/21      OT SHORT TERM GOAL #2   Title Pt will use LUE as a non dominant assist at least 25% of the time for ADLs with pain no greater than 6/10    Time 6    Period Weeks    Status New      OT SHORT TERM GOAL #3   Title Pt will demonstrate 65 shoulder flexion in prep for functional reach    Baseline 50*    Time 6    Period Weeks    Status New      OT SHORT TERM GOAL #4   Title Pt will demonstrate improved fine motor coordination as evidenced by decreasing 9 hole peg test score to 42 secs or less    Baseline RUE 19.45, LUE 45 secs    Time 6    Period Weeks    Status New               OT Long Term Goals - 04/13/21 1210       OT LONG TERM GOAL #1   Title I with updated HEP    Time 12    Period Weeks    Status New    Target Date 07/06/21      OT LONG TERM GOAL #2   Title Pt will use LUE as a non dominant assist at least 50% of the time for ADLS with pain less than or equal to 4/10    Time 12    Period Weeks    Status New      OT LONG TERM GOAL #3   Title Pt. will demonstrate 75* shoulder flexion for LUE in prep for functional use.    Time 12    Period Weeks    Status New  OT LONG TERM GOAL #4   Title Pt will increase LUE grip strength by 5 lbs for increased LUE functional use during ADLS    Time 12    Period Weeks    Status New      OT LONG TERM GOAL #5   Title Pt. will demonstrate LUE elbow flexion WFLS for ADLS/IADLs.     Time 12    Period Weeks    Status New                   Plan - 04/18/21 1650     Clinical Impression Statement Pt is progressing towards goals. She tolerated gentle A/ROM and weightbearing today without a seizure.    OT Occupational Profile and History Detailed Assessment- Review of Records and additional review of physical, cognitive, psychosocial history related to current functional performance    Occupational performance deficits (Please refer to evaluation for details): ADL's;IADL's;Rest and Sleep;Leisure;Social Participation;Work    Marketing executive / Function / Physical Skills ADL;UE functional use;Balance;Flexibility;Pain;FMC;Gait;ROM;GMC;Coordination;Sensation;Decreased knowledge of precautions;Decreased knowledge of use of DME;IADL;Dexterity;Mobility;Strength    Rehab Potential Fair    Clinical Decision Making Several treatment options, min-mod task modification necessary    Comorbidities Affecting Occupational Performance: May have comorbidities impacting occupational performance    Modification or Assistance to Complete Evaluation  Min-Moderate modification of tasks or assist with assess necessary to complete eval    OT Frequency 1x / week   Pt was scheduled for 8 weeks, may d/c after 8 weeks dependent on progress.   OT Duration 12 weeks    OT Treatment/Interventions Self-care/ADL training;Visual/perceptual remediation/compensation;Ultrasound;Patient/family education;DME and/or AE instruction;Paraffin;Passive range of motion;Balance training;Fluidtherapy;Cryotherapy;Functional Mobility Training;Splinting;Therapeutic activities;Manual Therapy;Therapeutic exercise;Moist Heat;Neuromuscular education    Plan hot pack, gentle ROM and functional use of LUE    Consulted and Agree with Plan of Care Patient             Patient will benefit from skilled therapeutic intervention in order to improve the following deficits and impairments:   Body Structure / Function / Physical  Skills: ADL, UE functional use, Balance, Flexibility, Pain, FMC, Gait, ROM, GMC, Coordination, Sensation, Decreased knowledge of precautions, Decreased knowledge of use of DME, IADL, Dexterity, Mobility, Strength       Visit Diagnosis: Muscle weakness (generalized)  Left shoulder pain, unspecified chronicity  Other lack of coordination  Left arm pain    Problem List Patient Active Problem List   Diagnosis Date Noted   Left-sided weakness 01/31/2021   Gout 01/31/2021   Urinary frequency 05/07/2017   Abdominal bloating 05/07/2017   Back pain 03/21/2017   Tachycardia 02/28/2017   Preventative health care 11/04/2016   Hoarseness 04/15/2016   Dysphagia 04/15/2016   Atypical chest pain 04/13/2016   Dysuria 03/04/2016   Hematuria 03/04/2016   Palpitations 01/30/2016   History of shingles 01/22/2016   Eczema 08/22/2015   Hyperlipidemia, mixed 08/22/2015   Hyperglycemia 01/15/2015   Lumbar radiculopathy 11/19/2014   GERD (gastroesophageal reflux disease) 09/05/2014   Noncompliance with medications 08/11/2014   Constipation 12/27/2013   Pain in joint, shoulder region 10/13/2013   Shoulder pain 09/23/2013   Numbness and tingling in right hand 09/23/2013   Jaw pain 03/26/2013   Neck pain 05/17/2012   Seizure disorder ([place]) 02/21/2012   Non compliance w medication regimen 02/21/2012   Essential hypertension 09/06/2011   Abdominal pain 06/20/2011   Hemorrhoids 06/20/2011   Hx of adenomatous colonic polyps 06/13/2011   Tubular adenoma of colon 05/2011   Musculoskeletal chest  pain 04/23/2011   Radiculopathy of leg 02/02/2011   History of pseudoseizure 07/10/2010   Anxiety and depression 12/29/2009   TINEA PEDIS 06/02/2009   Exertional dyspnea 01/13/2008   MICROSCOPIC HEMATURIA 06/24/2007   WEIGHT LOSS 04/28/2007   Anemia 03/10/2007   Migraine 03/10/2007   ARTHRITIS 03/10/2007   SYNCOPE 03/10/2007   Menometrorrhagia 2006    Ruth Bell, OT 04/18/2021, 4:52  PM  [facility] [address] [place], [place], [address] Phone: [phone]   Fax:  [phone]  Name: Ruth Bell MRN: [id number] Date of Birth: [birth year]

## 2021-04-19 ENCOUNTER — Telehealth: Payer: Self-pay

## 2021-04-19 ENCOUNTER — Ambulatory Visit: Payer: 59 | Attending: Neurology | Admitting: Physical Therapy

## 2021-04-19 ENCOUNTER — Telehealth: Payer: Self-pay | Admitting: Family Medicine

## 2021-04-19 DIAGNOSIS — M25512 Pain in left shoulder: Secondary | ICD-10-CM | POA: Insufficient documentation

## 2021-04-19 DIAGNOSIS — R2689 Other abnormalities of gait and mobility: Secondary | ICD-10-CM | POA: Diagnosis present

## 2021-04-19 DIAGNOSIS — M79602 Pain in left arm: Secondary | ICD-10-CM | POA: Diagnosis present

## 2021-04-19 DIAGNOSIS — R2681 Unsteadiness on feet: Secondary | ICD-10-CM | POA: Insufficient documentation

## 2021-04-19 DIAGNOSIS — R278 Other lack of coordination: Secondary | ICD-10-CM | POA: Insufficient documentation

## 2021-04-19 DIAGNOSIS — M6281 Muscle weakness (generalized): Secondary | ICD-10-CM | POA: Diagnosis not present

## 2021-04-19 NOTE — Telephone Encounter (Signed)
See other note

## 2021-04-19 NOTE — Therapy (Signed)
[facility] [address] [place], [place], [address] Phone: [phone]   Fax:  [phone]  Physical Therapy Treatment  Patient Details  Name: Ruth Bell MRN: [id number] Date of Birth: [birth year] Referring Provider (PT): Ellouise Newer   Encounter Date: 04/19/2021    Past Medical History:  Diagnosis Date   Anemia    Anxiety    Arm skin lesion, left 09/05/2014   Constipation 12/27/2013   Depression    Dysphagia 04/15/2016   Eczema 08/22/2015   GERD (gastroesophageal reflux disease)    Hematuria 03/04/2016   History of shingles 01/22/2016   Hyperglycemia 01/15/2015   Hyperlipidemia, mixed 08/22/2015   Hypertension    Menometrorrhagia 2006   Migraine, unspecified, without mention of intractable migraine without mention of status migrainosus    Preventative health care 11/04/2016   Pseudoseizures ([place])    Seizures ([place])    history of pseudoseizure disorder, last last seizure 3 wks, keppra inc in dosage   Tubular adenoma of colon 05/2011   Uterine leiomyoma 2006    Past Surgical History:  Procedure Laterality Date   ABDOMINAL HYSTERECTOMY  04/06/04   APPENDECTOMY     BILATERAL SALPINGOOPHORECTOMY  04/06/04   CARDIAC EVENT MONITOR  02/2016   Mostly sinus bradycardia and sinus rhythm.  Rare PVCs and PACs.  No arrhythmias.  Symptoms of chest pain and fluttering as well as "passed out spell" noted with normal sinus rhythm.   MASS EXCISION Left 06/02/2015   Procedure: EXCISION LEFT ARM MASS;  Surgeon: Autumn Messing III, MD;  Location: [facility];  Service: General;  Laterality: Left;   NM MYOVIEW LTD  04/2016   Reached heart rate of 127 bpm with Lexiscan.  EF 60 to 65%.  LOW RISK.  No ischemia or infarction.   ROTATOR CUFF REPAIR     TRANSTHORACIC ECHOCARDIOGRAM  01/2016   F 55-6%.  No R WMA.  GR 1 DD.  Normal valves.    There were no vitals filed for this visit.   Subjective Assessment - 04/19/21 0850      Subjective Pt reports some mild pain in the left hip going up to the back. Pt states she's been doing her exercises at home.    Patient is accompained by: Family member   husband   Pertinent History anemia, anxiety, depression, HTN, HLD, migraine- stress triggered.    Patient Stated Goals Pt wants to be able to go to work.    Currently in Pain? Yes    Pain Score 9     Pain Location Hip    Pain Orientation Left    Pain Descriptors / Indicators Aching    Pain Type Chronic pain    Pain Onset More than a month ago    Pain Onset More than a month ago                               [facility] Adult PT Treatment/Exercise - 04/19/21 0001       Transfers   Sit to Stand 5: Supervision    Sit to Stand Details (indicate cue type and reason) Continued increased R weight shift    Five time sit to stand comments  27 sec from low mat table without hands    Stand to Sit 5: Supervision      Ambulation/Gait   Ambulation/Gait Yes    Ambulation/Gait Assistance 5: Supervision    Ambulation  Distance (Feet) 400 Feet   rest break at 330'   Assistive device Straight cane;None    Gait Pattern Step-through pattern;Decreased stance time - left;Decreased arm swing - left;Decreased hip/knee flexion - left;Antalgic;Wide base of support    Ambulation Surface Level;Indoor      Timed Up and Go Test   Normal TUG (seconds) 25.63   no a/d     Knee/Hip Exercises: Stretches   Piriformis Stretch Left;2 reps;30 seconds    Other Knee/Hip Stretches Figure 4 stretch 2x30 sec      Manual Therapy   Manual Therapy Taping    Soft tissue mobilization TPR and STM L glute and vastus lateralis    Kinesiotex Facilitate Muscle      Kinesiotix   Facilitate Muscle  2 "I" strip tape from tibial tuberosity along vastus lateralis and other strip along VMO                 Balance Exercises - 04/19/21 0001       Balance Exercises: Standing   Tandem Stance Eyes open;2 reps;30 secs    Tandem Gait  Forward;3 reps   in // bars   Retro Gait 3 reps   in // bars                 PT Short Term Goals - 04/19/21 0901       PT SHORT TERM GOAL #1   Title Pt will be independent with HEP for strengthening and balance to continue gains on own.    Time 4    Period Weeks    Status New    Target Date 04/20/21      PT SHORT TERM GOAL #2   Title Pt will ambulate >500' without AD on level surfaces independently for improved household and short community distances.    Baseline 500' with seated rest break @ 330' without a/d, limited due to L hip pain    Time 4    Period Weeks    Status Partially Met    Target Date 04/20/21      PT SHORT TERM GOAL #3   Title Functional testing will be completed at follow-up visit and LTGs updated.    Baseline 03/29/21 PT completed 5 x sit to stand, gait speed and TUG    Time 4    Period Weeks    Status Achieved    Target Date 04/20/21               PT Long Term Goals - 04/19/21 1301       PT LONG TERM GOAL #1   Title Pt will ambulate >500' on varied surfaces with cane supervision for improved short community distances. (LTGs due 05/12/21)    Time 8    Period Weeks    Status On-going    Target Date 05/12/21      PT LONG TERM GOAL #2   Title Pt will decrease 5 x sit to stand from 30.47 sec to <25 sec for improved balance and functional strength.    Baseline 04/19/21 27 sec without hands from mat table    Time 8    Period Weeks    Status On-going    Target Date 05/12/21      PT LONG TERM GOAL #3   Title Pt will increase gait speed from 0.48ms to >0.768m for improved community mobility.    Baseline 03/29/21 0.5547m   Time 8    Period Weeks    Status  On-going    Target Date 05/12/21      PT LONG TERM GOAL #4   Title Pt will decrease TUG from 21.29 sec to <17 sec for improved balance and functional mobility.    Baseline 03/29/21 21.29 sec without AD; 04/19/21 26.63 sec without a/d    Time 8    Period Weeks    Status On-going     Target Date 05/12/21                   Plan - 04/19/21 1256     Clinical Impression Statement Checked STGs this session. Pt able to walk 400' but required 1 seated rest break. Continued to work on gait to decrease antalgic pattern. TUG appears slower this session but may be due to fatigue after ambulating a longer distance. 5x STS continues to improve. Pt tolerated session well. Added gentle hip stretches to try and address any hip tightness/pain. Discussed gentle manual work for her L knee/quad (this became painful after prolonged amb with notable trigger point) and utilized K-tape today to facilitate/decrease pain.    Personal Factors and Comorbidities Comorbidity 3+;Behavior Pattern    Comorbidities anemia, anxiety, depression, HTN, HLD, migraine,    Examination-Activity Limitations Stairs;Locomotion Level;Transfers    Examination-Participation Restrictions Community Activity;Occupation;Meal Prep;Cleaning;Laundry    Stability/Clinical Decision Making Evolving/Moderate complexity    Rehab Potential Fair    PT Frequency 1x / week    PT Duration 8 weeks    PT Treatment/Interventions DME Instruction;Gait training;Stair training;Functional mobility training;Therapeutic activities;Therapeutic exercise;Balance training;Neuromuscular re-education;Passive range of motion;Patient/family education;Taping;Dry needling;Manual techniques    PT Next Visit Plan How did new counter exercises go? Did they speak to cardiologist? Did pain management contact them yet? Check STGs and continue to work on more functional activities with breaks as needed.    Consulted and Agree with Plan of Care Patient             Patient will benefit from skilled therapeutic intervention in order to improve the following deficits and impairments:  Abnormal gait, Decreased mobility, Decreased strength, Impaired sensation, Decreased knowledge of use of DME, Decreased balance  Visit Diagnosis: Muscle weakness  (generalized)  Other lack of coordination  Unsteadiness on feet     Problem List Patient Active Problem List   Diagnosis Date Noted   Left-sided weakness 01/31/2021   Gout 01/31/2021   Urinary frequency 05/07/2017   Abdominal bloating 05/07/2017   Back pain 03/21/2017   Tachycardia 02/28/2017   Preventative health care 11/04/2016   Hoarseness 04/15/2016   Dysphagia 04/15/2016   Atypical chest pain 04/13/2016   Dysuria 03/04/2016   Hematuria 03/04/2016   Palpitations 01/30/2016   History of shingles 01/22/2016   Eczema 08/22/2015   Hyperlipidemia, mixed 08/22/2015   Hyperglycemia 01/15/2015   Lumbar radiculopathy 11/19/2014   GERD (gastroesophageal reflux disease) 09/05/2014   Noncompliance with medications 08/11/2014   Constipation 12/27/2013   Pain in joint, shoulder region 10/13/2013   Shoulder pain 09/23/2013   Numbness and tingling in right hand 09/23/2013   Jaw pain 03/26/2013   Neck pain 05/17/2012   Seizure disorder ([place]) 02/21/2012   Non compliance w medication regimen 02/21/2012   Essential hypertension 09/06/2011   Abdominal pain 06/20/2011   Hemorrhoids 06/20/2011   Hx of adenomatous colonic polyps 06/13/2011   Tubular adenoma of colon 05/2011   Musculoskeletal chest pain 04/23/2011   Radiculopathy of leg 02/02/2011   History of pseudoseizure 07/10/2010   Anxiety and depression 12/29/2009   TINEA PEDIS  06/02/2009   Exertional dyspnea 01/13/2008   MICROSCOPIC HEMATURIA 06/24/2007   WEIGHT LOSS 04/28/2007   Anemia 03/10/2007   Migraine 03/10/2007   ARTHRITIS 03/10/2007   SYNCOPE 03/10/2007   Menometrorrhagia [address] [place], [place], DPT 04/19/2021, 1:09 PM  [facility] [address] [place], [place], [address] Phone: [phone]   Fax:  [phone]  Name: Ruth Bell MRN: [id number] Date of Birth: 15-Mar-1961

## 2021-04-19 NOTE — Telephone Encounter (Signed)
Pt called an informed that brain MRI did not show any evidence of stroke, tumor, or bleed. It is normal

## 2021-04-19 NOTE — Telephone Encounter (Signed)
Patient spouse stated patient does have the meloxicam.  Spouse would like for a call returned.

## 2021-04-19 NOTE — Telephone Encounter (Signed)
-----   Message from Cameron Sprang, MD sent at 04/19/2021  1:50 PM EST ----- Regarding: MRI results Pls let patient/husband know the brain MRI did not show any evidence of stroke, tumor, or bleed. It is normal, thanks  ----- Message ----- From: Virl Son Sent: 04/19/2021  12:10 PM EST To: Cameron Sprang, MD

## 2021-04-20 ENCOUNTER — Telehealth: Payer: Self-pay | Admitting: Family Medicine

## 2021-04-20 NOTE — Telephone Encounter (Signed)
Done

## 2021-04-20 NOTE — Telephone Encounter (Signed)
Pt's husband called to start the process of obtaining a handicap sticker for her car. Please advise.

## 2021-04-20 NOTE — Telephone Encounter (Signed)
Paperwork in yellow bin

## 2021-04-26 ENCOUNTER — Ambulatory Visit: Payer: 59

## 2021-04-26 ENCOUNTER — Other Ambulatory Visit: Payer: Self-pay

## 2021-04-26 VITALS — HR 70

## 2021-04-26 DIAGNOSIS — M6281 Muscle weakness (generalized): Secondary | ICD-10-CM | POA: Diagnosis not present

## 2021-04-26 DIAGNOSIS — R2681 Unsteadiness on feet: Secondary | ICD-10-CM

## 2021-04-26 DIAGNOSIS — R2689 Other abnormalities of gait and mobility: Secondary | ICD-10-CM

## 2021-04-26 NOTE — Therapy (Signed)
[facility] [address] [place], [place], [address] Phone: [phone]   Fax:  [phone]  Physical Therapy Treatment  Patient Details  Name: Ruth Bell MRN: [id number] Date of Birth: 1960/11/14 Referring Provider (PT): Ellouise Newer   Encounter Date: 04/26/2021   PT End of Session - 04/26/21 0851     Visit Number 6    Number of Visits 8    Date for PT Re-Evaluation 05/12/21    Authorization Type [employer]    PT Start Time 0848    PT Stop Time 0930    PT Time Calculation (min) 42 min    Equipment Utilized During Treatment Gait belt    Activity Tolerance Patient limited by pain    Behavior During Therapy [facility] for tasks assessed/performed             Past Medical History:  Diagnosis Date   Anemia    Anxiety    Arm skin lesion, left 09/05/2014   Constipation 12/27/2013   Depression    Dysphagia 04/15/2016   Eczema 08/22/2015   GERD (gastroesophageal reflux disease)    Hematuria 03/04/2016   History of shingles 01/22/2016   Hyperglycemia 01/15/2015   Hyperlipidemia, mixed 08/22/2015   Hypertension    Menometrorrhagia 2006   Migraine, unspecified, without mention of intractable migraine without mention of status migrainosus    Preventative health care 11/04/2016   Pseudoseizures ([place])    Seizures ([place])    history of pseudoseizure disorder, last last seizure 3 wks, keppra inc in dosage   Tubular adenoma of colon 05/2011   Uterine leiomyoma 2006    Past Surgical History:  Procedure Laterality Date   ABDOMINAL HYSTERECTOMY  04/06/04   APPENDECTOMY     BILATERAL SALPINGOOPHORECTOMY  04/06/04   CARDIAC EVENT MONITOR  02/2016   Mostly sinus bradycardia and sinus rhythm.  Rare PVCs and PACs.  No arrhythmias.  Symptoms of chest pain and fluttering as well as "passed out spell" noted with normal sinus rhythm.   MASS EXCISION Left 06/02/2015   Procedure: EXCISION LEFT ARM MASS;  Surgeon: Autumn Messing III, MD;   Location: [facility];  Service: General;  Laterality: Left;   NM MYOVIEW LTD  04/2016   Reached heart rate of 127 bpm with Lexiscan.  EF 60 to 65%.  LOW RISK.  No ischemia or infarction.   ROTATOR CUFF REPAIR     TRANSTHORACIC ECHOCARDIOGRAM  01/2016   F 55-6%.  No R WMA.  GR 1 DD.  Normal valves.    Vitals:   04/26/21 0852  Pulse: 70     Subjective Assessment - 04/26/21 0852     Subjective Pt reports that she has been doing better. She has been working on moving her leg and arm more and not hurting as much. She has been using roller on left thigh as well. She is proud of herself. Husband stayed in waiting room today. Pt feels she can push herself more that way. Pt has not seen pain management yet or heard anything. Reports that the heart monitor showed something skipping. Seemed to be occuring when sleeping. Thinks they will place another monitor on.    Patient is accompained by: Family member   husband   Pertinent History anemia, anxiety, depression, HTN, HLD, migraine- stress triggered.    Patient Stated Goals Pt wants to be able to go to work.    Currently in Pain? Yes    Pain Score 9  Pain Location Hip   and low back   Pain Orientation Left    Pain Descriptors / Indicators Aching    Pain Onset More than a month ago    Pain Frequency Constant    Aggravating Factors  a lot of movement    Pain Relieving Factors heat    Pain Onset More than a month ago                               [facility] Adult PT Treatment/Exercise - 04/26/21 0855       Transfers   Transfers Sit to Stand;Stand to Sit    Sit to Stand 5: Supervision;With upper extremity assist    Sit to Stand Details (indicate cue type and reason) increased weight shift to right but improving from last assessment    Stand to Sit 5: Supervision      Ambulation/Gait   Ambulation/Gait Yes    Ambulation/Gait Assistance 5: Supervision    Ambulation/Gait Assistance Details Verbal cues for  upright posture and to focus on increasing left foot clearance. To engage core to help as well.    Ambulation Distance (Feet) 345 Feet    Assistive device Straight cane   with quad tip   Gait Pattern Step-through pattern    Ambulation Surface Level;Indoor      Neuro Re-ed    Neuro Re-ed Details  In // bars: reciprocal stepping over 4 bolsters x 8 bouts. Verbal cues to go slow and controlled. Stepping over 2" bolster and back with LLE x 10 with verbal cues to tighten gluts on right and engage core. Seated breaks between activities. Pt did report some pain in left hip/low back area. Standing on airex: feet apart eyes closed x 30 sec, feet together eyes closed x 30 sec, feet apart weight shifting side to side x 10 with verbal and tactile cues for form.      Exercises   Exercises Other Exercises    Other Exercises  Hooklying trunk rotation x 5 each side. Discussed decreasing range to left as left leg falling to side at times.                     PT Education - 04/26/21 0945     Education Details Added trunk rotation within comfortable range to HEP    Person(s) Educated Patient    Methods Explanation;Handout    Comprehension Verbalized understanding              PT Short Term Goals - 04/26/21 0948       PT SHORT TERM GOAL #1   Title Pt will be independent with HEP for strengthening and balance to continue gains on own.    Baseline PT continues to add to HEP. Pt has been working on current exercises.    Time 4    Period Weeks    Status Partially Met    Target Date 04/20/21      PT SHORT TERM GOAL #2   Title Pt will ambulate >500' without AD on level surfaces independently for improved household and short community distances.    Baseline 500' with seated rest break @ 330' without a/d, limited due to L hip pain    Time 4    Period Weeks    Status Partially Met    Target Date 04/20/21      PT SHORT TERM GOAL #3   Title Functional testing  will be completed at  follow-up visit and LTGs updated.    Baseline 03/29/21 PT completed 5 x sit to stand, gait speed and TUG    Time 4    Period Weeks    Status Achieved    Target Date 04/20/21               PT Long Term Goals - 04/19/21 1301       PT LONG TERM GOAL #1   Title Pt will ambulate >500' on varied surfaces with cane supervision for improved short community distances. (LTGs due 05/12/21)    Time 8    Period Weeks    Status On-going    Target Date 05/12/21      PT LONG TERM GOAL #2   Title Pt will decrease 5 x sit to stand from 30.47 sec to <25 sec for improved balance and functional strength.    Baseline 04/19/21 27 sec without hands from mat table    Time 8    Period Weeks    Status On-going    Target Date 05/12/21      PT LONG TERM GOAL #3   Title Pt will increase gait speed from 0.67ms to >0.763m for improved community mobility.    Baseline 03/29/21 0.5534m   Time 8    Period Weeks    Status On-going    Target Date 05/12/21      PT LONG TERM GOAL #4   Title Pt will decrease TUG from 21.29 sec to <17 sec for improved balance and functional mobility.    Baseline 03/29/21 21.29 sec without AD; 04/19/21 26.63 sec without a/d    Time 8    Period Weeks    Status On-going    Target Date 05/12/21                   Plan - 04/26/21 0946     Clinical Impression Statement PT continued to focus on improving left hip/knee flexion with functional activities. Pt able to demonstrate improved clearance today during gait. She still has increased weight shift to right. Was able to shift fully over left with visual and verbal cues in standing activity. Added trunk rotation in hooklying to try to increase mobility in low back/hip area.    Personal Factors and Comorbidities Comorbidity 3+;Behavior Pattern    Comorbidities anemia, anxiety, depression, HTN, HLD, migraine,    Examination-Activity Limitations Stairs;Locomotion Level;Transfers    Examination-Participation Restrictions  Community Activity;Occupation;Meal Prep;Cleaning;Laundry    Stability/Clinical Decision Making Evolving/Moderate complexity    Rehab Potential Fair    PT Frequency 1x / week    PT Duration 8 weeks    PT Treatment/Interventions DME Instruction;Gait training;Stair training;Functional mobility training;Therapeutic activities;Therapeutic exercise;Balance training;Neuromuscular re-education;Passive range of motion;Patient/family education;Taping;Dry needling;Manual techniques    PT Next Visit Plan Did pain management contact them yet?  continue to work on more functional activities with breaks as needed.    Consulted and Agree with Plan of Care Patient             Patient will benefit from skilled therapeutic intervention in order to improve the following deficits and impairments:  Abnormal gait, Decreased mobility, Decreased strength, Impaired sensation, Decreased knowledge of use of DME, Decreased balance  Visit Diagnosis: Other abnormalities of gait and mobility  Muscle weakness (generalized)  Unsteadiness on feet     Problem List Patient Active Problem List   Diagnosis Date Noted   Left-sided weakness 01/31/2021   Gout 01/31/2021  Urinary frequency 05/07/2017   Abdominal bloating 05/07/2017   Back pain 03/21/2017   Tachycardia 02/28/2017   Preventative health care 11/04/2016   Hoarseness 04/15/2016   Dysphagia 04/15/2016   Atypical chest pain 04/13/2016   Dysuria 03/04/2016   Hematuria 03/04/2016   Palpitations 01/30/2016   History of shingles 01/22/2016   Eczema 08/22/2015   Hyperlipidemia, mixed 08/22/2015   Hyperglycemia 01/15/2015   Lumbar radiculopathy 11/19/2014   GERD (gastroesophageal reflux disease) 09/05/2014   Noncompliance with medications 08/11/2014   Constipation 12/27/2013   Pain in joint, shoulder region 10/13/2013   Shoulder pain 09/23/2013   Numbness and tingling in right hand 09/23/2013   Jaw pain 03/26/2013   Neck pain 05/17/2012   Seizure  disorder ([place]) 02/21/2012   Non compliance w medication regimen 02/21/2012   Essential hypertension 09/06/2011   Abdominal pain 06/20/2011   Hemorrhoids 06/20/2011   Hx of adenomatous colonic polyps 06/13/2011   Tubular adenoma of colon 05/2011   Musculoskeletal chest pain 04/23/2011   Radiculopathy of leg 02/02/2011   History of pseudoseizure 07/10/2010   Anxiety and depression 12/29/2009   TINEA PEDIS 06/02/2009   Exertional dyspnea 01/13/2008   MICROSCOPIC HEMATURIA 06/24/2007   WEIGHT LOSS 04/28/2007   Anemia 03/10/2007   Migraine 03/10/2007   ARTHRITIS 03/10/2007   SYNCOPE 03/10/2007   Menometrorrhagia 2006    Electa Sniff, PT, DPT, NCS 04/26/2021, 9:50 AM  [facility] [address] [place], [place], [address] Phone: [phone]   Fax:  [phone]  Name: NAKEA GOUGER MRN: [id number] Date of Birth: 03-19-1961

## 2021-04-26 NOTE — Patient Instructions (Signed)
Access Code: [email or web address] URL: [email or web address] Date: 04/26/2021 Prepared by: [name]  Exercises Seated Heel Slide - 2 x daily - 5 x weekly - 2 sets - 10 reps Seated Long Arc Quad - 2 x daily - 5 x weekly - 2 sets - 10 reps Seated Ankle Pumps - 2 x daily - 5 x weekly - 2 sets - 10 reps Seated Hip Adduction Isometrics with Ball - 2 x daily - 7 x weekly - 2 sets - 10 reps - 5 sec hold Standing March with Counter Support - 2 x daily - 5 x weekly - 1 sets - 10 reps Side Stepping with Counter Support - 2 x daily - 5 x weekly - 1 sets - 2-3 reps Forward/backwards walk at counter - 2 x daily - 5 x weekly - 1 sets - 2-3 reps Supine Lower Trunk Rotation - 1 x daily - 7 x weekly - 2 sets - 5 reps

## 2021-04-27 ENCOUNTER — Ambulatory Visit: Payer: 59 | Admitting: Occupational Therapy

## 2021-04-27 ENCOUNTER — Encounter: Payer: Self-pay | Admitting: Occupational Therapy

## 2021-04-27 DIAGNOSIS — M6281 Muscle weakness (generalized): Secondary | ICD-10-CM | POA: Diagnosis not present

## 2021-04-27 DIAGNOSIS — M25512 Pain in left shoulder: Secondary | ICD-10-CM

## 2021-04-27 DIAGNOSIS — M79602 Pain in left arm: Secondary | ICD-10-CM

## 2021-04-27 DIAGNOSIS — R278 Other lack of coordination: Secondary | ICD-10-CM

## 2021-04-27 NOTE — Therapy (Signed)
[facility] [address] [place], [place], [address] Phone: [phone]   Fax:  [phone]  Occupational Therapy Treatment  Patient Details  Name: Ruth Bell MRN: [id number] Date of Birth: 1961-01-20 Referring Provider (OT): Dr Delice Lesch   Encounter Date: 04/27/2021   OT End of Session - 04/27/21 0730     Visit Number 3    Number of Visits 13    Date for OT Re-Evaluation 07/06/21    Authorization Type [employer]    Authorization Time Period 12 weeks    Authorization - Visit Number 3    Authorization - Number of Visits 30    OT Start Time [phone]    OT Stop Time 0802    OT Time Calculation (min) 39 min    Activity Tolerance Patient tolerated treatment well    Behavior During Therapy [facility] for tasks assessed/performed             Past Medical History:  Diagnosis Date   Anemia    Anxiety    Arm skin lesion, left 09/05/2014   Constipation 12/27/2013   Depression    Dysphagia 04/15/2016   Eczema 08/22/2015   GERD (gastroesophageal reflux disease)    Hematuria 03/04/2016   History of shingles 01/22/2016   Hyperglycemia 01/15/2015   Hyperlipidemia, mixed 08/22/2015   Hypertension    Menometrorrhagia 2006   Migraine, unspecified, without mention of intractable migraine without mention of status migrainosus    Preventative health care 11/04/2016   Pseudoseizures ([place])    Seizures ([place])    history of pseudoseizure disorder, last last seizure 3 wks, keppra inc in dosage   Tubular adenoma of colon 05/2011   Uterine leiomyoma 2006    Past Surgical History:  Procedure Laterality Date   ABDOMINAL HYSTERECTOMY  04/06/04   APPENDECTOMY     BILATERAL SALPINGOOPHORECTOMY  04/06/04   CARDIAC EVENT MONITOR  02/2016   Mostly sinus bradycardia and sinus rhythm.  Rare PVCs and PACs.  No arrhythmias.  Symptoms of chest pain and fluttering as well as "passed out spell" noted with normal sinus rhythm.   MASS EXCISION Left  06/02/2015   Procedure: EXCISION LEFT ARM MASS;  Surgeon: Autumn Messing III, MD;  Location: [facility];  Service: General;  Laterality: Left;   NM MYOVIEW LTD  04/2016   Reached heart rate of 127 bpm with Lexiscan.  EF 60 to 65%.  LOW RISK.  No ischemia or infarction.   ROTATOR CUFF REPAIR     TRANSTHORACIC ECHOCARDIOGRAM  01/2016   F 55-6%.  No R WMA.  GR 1 DD.  Normal valves.    There were no vitals filed for this visit.   Subjective Assessment - 04/27/21 0729     Subjective  Pt reports that she has been exercising at home    Pertinent History Pt was referred for left sided weakness and psychogenic nonepileptic seizure. She reports she had a stroke on 11/6 but imaging was all negative and had left sided weakness throughout including face. She reports still having some numbness. Pt reports that this is different than the seizure like activity she has had in the past. She reports 1 fall when this occurred as left leg gave out on her. She denies any vision changes.       Patient is accompained by: Family member   husband    Pertinent History anemia, anxiety, depression, HTN, HLD, migraine- stress triggered . hx of rotator cuff tear  bilaterally, pt reports hx of LUE fx 1 year ago    Patient Stated Goals Get my arm back and to be able to do things on my own and not be in so much pain and be able    Currently in Pain? Yes    Pain Score 9     Pain Location Shoulder    Pain Orientation Left    Pain Descriptors / Indicators Stabbing    Pain Type Chronic pain    Pain Onset More than a month ago    Pain Frequency Constant    Aggravating Factors  movement    Pain Relieving Factors heat               Hotpack applied to left shoulder in supine with no adverse reactions  while pt performed AA/ROM elbow flexion/extension, wrist flex/ext, supination/pronation, finger flexion/extension, shoulder ER, and scapular retraction and depression as tolerated x10-15 each with breaks prn.   In  supine AA/ROM shoulder abduction and chest press with cane x12 each.  Reviewed HEP and pt returned demo each x10-15 with min v.c.  Pt required frequent rest breaks and encouragement , ataxic movements present in LUE at various times during session.  Pt ambulated to and from gym with cane and close supervision for safety.           OT Short Term Goals - 04/13/21 1134       OT SHORT TERM GOAL #1   Title I with inital HEP    Time 6    Period Weeks    Status New    Target Date 05/25/21      OT SHORT TERM GOAL #2   Title Pt will use LUE as a non dominant assist at least 25% of the time for ADLs with pain no greater than 6/10    Time 6    Period Weeks    Status New      OT SHORT TERM GOAL #3   Title Pt will demonstrate 65 shoulder flexion in prep for functional reach    Baseline 50*    Time 6    Period Weeks    Status New      OT SHORT TERM GOAL #4   Title Pt will demonstrate improved fine motor coordination as evidenced by decreasing 9 hole peg test score to 42 secs or less    Baseline RUE 19.45, LUE 45 secs    Time 6    Period Weeks    Status New               OT Long Term Goals - 04/13/21 1210       OT LONG TERM GOAL #1   Title I with updated HEP    Time 12    Period Weeks    Status New    Target Date 07/06/21      OT LONG TERM GOAL #2   Title Pt will use LUE as a non dominant assist at least 50% of the time for ADLS with pain less than or equal to 4/10    Time 12    Period Weeks    Status New      OT LONG TERM GOAL #3   Title Pt. will demonstrate 75* shoulder flexion for LUE in prep for functional use.    Time 12    Period Weeks    Status New      OT LONG TERM GOAL #4   Title Pt will  increase LUE grip strength by 5 lbs for increased LUE functional use during ADLS    Time 12    Period Weeks    Status New      OT LONG TERM GOAL #5   Title Pt. will demonstrate LUE elbow flexion WFLS for ADLS/IADLs.    Time 12    Period Weeks    Status  New                   Plan - 04/27/21 0731     Clinical Impression Statement Pt is progressing towards goals. She tolerated gentle A/ROM and weightbearing today without a seizure  and was able to tolerate more repetitions prior to rest break.    OT Occupational Profile and History Detailed Assessment- Review of Records and additional review of physical, cognitive, psychosocial history related to current functional performance    Occupational performance deficits (Please refer to evaluation for details): ADL's;IADL's;Rest and Sleep;Leisure;Social Participation;Work    Marketing executive / Function / Physical Skills ADL;UE functional use;Balance;Flexibility;Pain;FMC;Gait;ROM;GMC;Coordination;Sensation;Decreased knowledge of precautions;Decreased knowledge of use of DME;IADL;Dexterity;Mobility;Strength    Rehab Potential Fair    Clinical Decision Making Several treatment options, min-mod task modification necessary    Comorbidities Affecting Occupational Performance: May have comorbidities impacting occupational performance    Modification or Assistance to Complete Evaluation  Min-Moderate modification of tasks or assist with assess necessary to complete eval    OT Frequency 1x / week   Pt was scheduled for 8 weeks, may d/c after 8 weeks dependent on progress.   OT Duration 12 weeks    OT Treatment/Interventions Self-care/ADL training;Visual/perceptual remediation/compensation;Ultrasound;Patient/family education;DME and/or AE instruction;Paraffin;Passive range of motion;Balance training;Fluidtherapy;Cryotherapy;Functional Mobility Training;Splinting;Therapeutic activities;Manual Therapy;Therapeutic exercise;Moist Heat;Neuromuscular education    Plan hot pack, gentle ROM and functional use of LUE    Consulted and Agree with Plan of Care Patient             Patient will benefit from skilled therapeutic intervention in order to improve the following deficits and impairments:   Body  Structure / Function / Physical Skills: ADL, UE functional use, Balance, Flexibility, Pain, FMC, Gait, ROM, GMC, Coordination, Sensation, Decreased knowledge of precautions, Decreased knowledge of use of DME, IADL, Dexterity, Mobility, Strength       Visit Diagnosis: Left shoulder pain, unspecified chronicity  Left arm pain  Muscle weakness (generalized)  Other lack of coordination    Problem List Patient Active Problem List   Diagnosis Date Noted   Left-sided weakness 01/31/2021   Gout 01/31/2021   Urinary frequency 05/07/2017   Abdominal bloating 05/07/2017   Back pain 03/21/2017   Tachycardia 02/28/2017   Preventative health care 11/04/2016   Hoarseness 04/15/2016   Dysphagia 04/15/2016   Atypical chest pain 04/13/2016   Dysuria 03/04/2016   Hematuria 03/04/2016   Palpitations 01/30/2016   History of shingles 01/22/2016   Eczema 08/22/2015   Hyperlipidemia, mixed 08/22/2015   Hyperglycemia 01/15/2015   Lumbar radiculopathy 11/19/2014   GERD (gastroesophageal reflux disease) 09/05/2014   Noncompliance with medications 08/11/2014   Constipation 12/27/2013   Pain in joint, shoulder region 10/13/2013   Shoulder pain 09/23/2013   Numbness and tingling in right hand 09/23/2013   Jaw pain 03/26/2013   Neck pain 05/17/2012   Seizure disorder ([place]) 02/21/2012   Non compliance w medication regimen 02/21/2012   Essential hypertension 09/06/2011   Abdominal pain 06/20/2011   Hemorrhoids 06/20/2011   Hx of adenomatous colonic polyps 06/13/2011   Tubular adenoma of colon 05/2011  Musculoskeletal chest pain 04/23/2011   Radiculopathy of leg 02/02/2011   History of pseudoseizure 07/10/2010   Anxiety and depression 12/29/2009   TINEA PEDIS 06/02/2009   Exertional dyspnea 01/13/2008   MICROSCOPIC HEMATURIA 06/24/2007   WEIGHT LOSS 04/28/2007   Anemia 03/10/2007   Migraine 03/10/2007   ARTHRITIS 03/10/2007   SYNCOPE 03/10/2007   Menometrorrhagia 2006     [place], OT 04/27/2021, 9:10 AM  [facility] [address] [place], [place], [address] Phone: [phone]   Fax:  [phone]  Name: Ruth Bell MRN: [id number] Date of Birth: [birth year]  Vianne Bulls, OTR/L [facility] [address] [place], [place]  [address] [phone] phone [phone] 04/27/21 9:10 AM

## 2021-05-02 ENCOUNTER — Encounter: Payer: Self-pay | Admitting: Occupational Therapy

## 2021-05-02 ENCOUNTER — Other Ambulatory Visit: Payer: Self-pay

## 2021-05-02 ENCOUNTER — Ambulatory Visit: Payer: 59 | Admitting: Occupational Therapy

## 2021-05-02 VITALS — BP 146/86

## 2021-05-02 DIAGNOSIS — M6281 Muscle weakness (generalized): Secondary | ICD-10-CM

## 2021-05-02 DIAGNOSIS — M79602 Pain in left arm: Secondary | ICD-10-CM

## 2021-05-02 DIAGNOSIS — R278 Other lack of coordination: Secondary | ICD-10-CM

## 2021-05-02 DIAGNOSIS — M25512 Pain in left shoulder: Secondary | ICD-10-CM

## 2021-05-02 DIAGNOSIS — R2689 Other abnormalities of gait and mobility: Secondary | ICD-10-CM

## 2021-05-02 NOTE — Therapy (Addendum)
.[facility] [address] [place], [place], [address] Phone: [phone]   Fax:  [phone]  Occupational Therapy Treatment  Patient Details  Name: Ruth Bell MRN: [id number] Date of Birth: [birth year] Referring Provider (OT): Dr Delice Lesch   Encounter Date: 05/02/2021   OT End of Session - 05/02/21 0816     Visit Number 4    Number of Visits 13    Date for OT Re-Evaluation 07/06/21    Authorization Type [employer]    Authorization Time Period 12 weeks    Authorization - Visit Number 4    Authorization - Number of Visits 46    OT Start Time 0804    OT Stop Time 0947    OT Time Calculation (min) 40 min             Past Medical History:  Diagnosis Date   Anemia    Anxiety    Arm skin lesion, left 09/05/2014   Constipation 12/27/2013   Depression    Dysphagia 04/15/2016   Eczema 08/22/2015   GERD (gastroesophageal reflux disease)    Hematuria 03/04/2016   History of shingles 01/22/2016   Hyperglycemia 01/15/2015   Hyperlipidemia, mixed 08/22/2015   Hypertension    Menometrorrhagia 2006   Migraine, unspecified, without mention of intractable migraine without mention of status migrainosus    Preventative health care 11/04/2016   Pseudoseizures ([place])    Seizures ([place])    history of pseudoseizure disorder, last last seizure 3 wks, keppra inc in dosage   Tubular adenoma of colon 05/2011   Uterine leiomyoma 2006    Past Surgical History:  Procedure Laterality Date   ABDOMINAL HYSTERECTOMY  04/06/04   APPENDECTOMY     BILATERAL SALPINGOOPHORECTOMY  04/06/04   CARDIAC EVENT MONITOR  02/2016   Mostly sinus bradycardia and sinus rhythm.  Rare PVCs and PACs.  No arrhythmias.  Symptoms of chest pain and fluttering as well as "passed out spell" noted with normal sinus rhythm.   MASS EXCISION Left 06/02/2015   Procedure: EXCISION LEFT ARM MASS;  Surgeon: Autumn Messing III, MD;  Location: [facility];   Service: General;  Laterality: Left;   NM MYOVIEW LTD  04/2016   Reached heart rate of 127 bpm with Lexiscan.  EF 60 to 65%.  LOW RISK.  No ischemia or infarction.   ROTATOR CUFF REPAIR     TRANSTHORACIC ECHOCARDIOGRAM  01/2016   F 55-6%.  No R WMA.  GR 1 DD.  Normal valves.    Vitals:   05/02/21 0809  BP: (!) 146/86     Subjective Assessment - 05/02/21 0809     Subjective  Pt reports continues left sided pain    Pertinent History Pt was referred for left sided weakness and psychogenic nonepileptic seizure. She reports she had a stroke on 11/6 but imaging was all negative and had left sided weakness throughout including face. She reports still having some numbness. Pt reports that this is different than the seizure like activity she has had in the past. She reports 1 fall when this occurred as left leg gave out on her. She denies any vision changes.       Patient is accompained by: Family member   husband    Pertinent History anemia, anxiety, depression, HTN, HLD, migraine- stress triggered . hx of rotator cuff tear bilaterally, pt reports hx of LUE fx 1 year ago    Patient Stated Goals Get my arm  back and to be able to do things on my own and not be in so much pain and be able    Currently in Pain? Yes    Pain Score 9     Pain Location Shoulder    Pain Orientation Left    Pain Descriptors / Indicators Aching    Pain Type Chronic pain    Pain Onset More than a month ago    Pain Frequency Constant    Aggravating Factors  movement    Pain Relieving Factors rest                      Hotpack applied to left shoulder in supine with no adverse reactions  while pt performed AA/ROM elbow flexion/extension,  supination/pronation,  as tolerated x10-15 each with breaks prn.   Pt reports her heart is racing at night and she is following up with MD.(HR today 71 Bpm)  In supine AA/ROM/A/ROM shoulder abduction and chest press with PVC pipe/ cane x 10-15 reps each.  Seated gentle  weightbearing through bilateral UE's while performing scapular retraction  Seated on mat functional reaching to place and remove graded clothespins from vertical antennae yellow red and green, ataxia noted, min facilitation to keep elbow closer to body  Pt required frequent rest breaks and encouragement, ataxic movements present in LUE at various times during session.  Flipping large playing cards with LUE, min facilitation/ v.c  Pt ambulated to and from gym with large based cane and close supervision for safety              OT Short Term Goals - 04/13/21 1134       OT SHORT TERM GOAL #1   Title I with inital HEP    Time 6    Period Weeks    Status New    Target Date 05/25/21      OT SHORT TERM GOAL #2   Title Pt will use LUE as a non dominant assist at least 25% of the time for ADLs with pain no greater than 6/10    Time 6    Period Weeks    Status New      OT SHORT TERM GOAL #3   Title Pt will demonstrate 65 shoulder flexion in prep for functional reach    Baseline 50*    Time 6    Period Weeks    Status New      OT SHORT TERM GOAL #4   Title Pt will demonstrate improved fine motor coordination as evidenced by decreasing 9 hole peg test score to 42 secs or less    Baseline RUE 19.45, LUE 45 secs    Time 6    Period Weeks    Status New               OT Long Term Goals - 04/13/21 1210       OT LONG TERM GOAL #1   Title I with updated HEP    Time 12    Period Weeks    Status New    Target Date 07/06/21      OT LONG TERM GOAL #2   Title Pt will use LUE as a non dominant assist at least 50% of the time for ADLS with pain less than or equal to 4/10    Time 12    Period Weeks    Status New      OT LONG TERM GOAL #3  Title Pt. will demonstrate 75* shoulder flexion for LUE in prep for functional use.    Time 12    Period Weeks    Status New      OT LONG TERM GOAL #4   Title Pt will increase LUE grip strength by 5 lbs for increased LUE  functional use during ADLS    Time 12    Period Weeks    Status New      OT LONG TERM GOAL #5   Title Pt. will demonstrate LUE elbow flexion WFLS for ADLS/IADLs.    Time 12    Period Weeks    Status New                   Plan - 05/02/21 1339     Clinical Impression Statement Pt is progressing towards goals. She tolerated gentle A/ROM and UE functional use.    OT Occupational Profile and History Detailed Assessment- Review of Records and additional review of physical, cognitive, psychosocial history related to current functional performance    Occupational performance deficits (Please refer to evaluation for details): ADL's;IADL's;Rest and Sleep;Leisure;Social Participation;Work    Marketing executive / Function / Physical Skills ADL;UE functional use;Balance;Flexibility;Pain;FMC;Gait;ROM;GMC;Coordination;Sensation;Decreased knowledge of precautions;Decreased knowledge of use of DME;IADL;Dexterity;Mobility;Strength    Rehab Potential Fair    Clinical Decision Making Several treatment options, min-mod task modification necessary    Comorbidities Affecting Occupational Performance: May have comorbidities impacting occupational performance    Modification or Assistance to Complete Evaluation  Min-Moderate modification of tasks or assist with assess necessary to complete eval    OT Frequency 1x / week   Pt was scheduled for 8 weeks, may d/c after 8 weeks dependent on progress.   OT Duration 12 weeks    OT Treatment/Interventions Self-care/ADL training;Visual/perceptual remediation/compensation;Ultrasound;Patient/family education;DME and/or AE instruction;Paraffin;Passive range of motion;Balance training;Fluidtherapy;Cryotherapy;Functional Mobility Training;Splinting;Therapeutic activities;Manual Therapy;Therapeutic exercise;Moist Heat;Neuromuscular education    Plan hot pack, issue functional use HEP, gentle ROM and functional use of LUE    Consulted and Agree with Plan of Care Patient              Patient will benefit from skilled therapeutic intervention in order to improve the following deficits and impairments:   Body Structure / Function / Physical Skills: ADL, UE functional use, Balance, Flexibility, Pain, FMC, Gait, ROM, GMC, Coordination, Sensation, Decreased knowledge of precautions, Decreased knowledge of use of DME, IADL, Dexterity, Mobility, Strength       Visit Diagnosis: Left shoulder pain, unspecified chronicity  Left arm pain  Muscle weakness (generalized)  Other lack of coordination  Other abnormalities of gait and mobility    Problem List Patient Active Problem List   Diagnosis Date Noted   Left-sided weakness 01/31/2021   Gout 01/31/2021   Urinary frequency 05/07/2017   Abdominal bloating 05/07/2017   Back pain 03/21/2017   Tachycardia 02/28/2017   Preventative health care 11/04/2016   Hoarseness 04/15/2016   Dysphagia 04/15/2016   Atypical chest pain 04/13/2016   Dysuria 03/04/2016   Hematuria 03/04/2016   Palpitations 01/30/2016   History of shingles 01/22/2016   Eczema 08/22/2015   Hyperlipidemia, mixed 08/22/2015   Hyperglycemia 01/15/2015   Lumbar radiculopathy 11/19/2014   GERD (gastroesophageal reflux disease) 09/05/2014   Noncompliance with medications 08/11/2014   Constipation 12/27/2013   Pain in joint, shoulder region 10/13/2013   Shoulder pain 09/23/2013   Numbness and tingling in right hand 09/23/2013   Jaw pain 03/26/2013   Neck pain 05/17/2012   Seizure disorder (  [place]) 02/21/2012   Non compliance w medication regimen 02/21/2012   Essential hypertension 09/06/2011   Abdominal pain 06/20/2011   Hemorrhoids 06/20/2011   Hx of adenomatous colonic polyps 06/13/2011   Tubular adenoma of colon 05/2011   Musculoskeletal chest pain 04/23/2011   Radiculopathy of leg 02/02/2011   History of pseudoseizure 07/10/2010   Anxiety and depression 12/29/2009   TINEA PEDIS 06/02/2009   Exertional dyspnea 01/13/2008    MICROSCOPIC HEMATURIA 06/24/2007   WEIGHT LOSS 04/28/2007   Anemia 03/10/2007   Migraine 03/10/2007   ARTHRITIS 03/10/2007   SYNCOPE 03/10/2007   Menometrorrhagia 2006    Jaylin Benzel, OT 05/02/2021, 1:42 PM  [facility] [address] [place], [place], [address] Phone: [phone]   Fax:  [phone]  Name: Ruth Bell MRN: [id number] Date of Birth: 1961/01/01

## 2021-05-03 ENCOUNTER — Ambulatory Visit: Payer: 59

## 2021-05-03 VITALS — BP 140/82 | HR 78

## 2021-05-03 DIAGNOSIS — R2689 Other abnormalities of gait and mobility: Secondary | ICD-10-CM

## 2021-05-03 DIAGNOSIS — M6281 Muscle weakness (generalized): Secondary | ICD-10-CM

## 2021-05-03 NOTE — Therapy (Signed)
[facility] [address] [place], [place], [address] Phone: [phone]   Fax:  [phone]  Physical Therapy Treatment  Patient Details  Name: Ruth Bell MRN: [id number] Date of Birth: November 03, 1960 Referring Provider (PT): Ellouise Newer   Encounter Date: 05/03/2021   PT End of Session - 05/03/21 0849     Visit Number 7    Number of Visits 8    Date for PT Re-Evaluation 05/12/21    Authorization Type [employer]    PT Start Time [phone]    PT Stop Time 0928    PT Time Calculation (min) 42 min    Equipment Utilized During Treatment Gait belt    Activity Tolerance Patient limited by pain    Behavior During Therapy [facility] for tasks assessed/performed             Past Medical History:  Diagnosis Date   Anemia    Anxiety    Arm skin lesion, left 09/05/2014   Constipation 12/27/2013   Depression    Dysphagia 04/15/2016   Eczema 08/22/2015   GERD (gastroesophageal reflux disease)    Hematuria 03/04/2016   History of shingles 01/22/2016   Hyperglycemia 01/15/2015   Hyperlipidemia, mixed 08/22/2015   Hypertension    Menometrorrhagia 2006   Migraine, unspecified, without mention of intractable migraine without mention of status migrainosus    Preventative health care 11/04/2016   Pseudoseizures ([place])    Seizures ([place])    history of pseudoseizure disorder, last last seizure 3 wks, keppra inc in dosage   Tubular adenoma of colon 05/2011   Uterine leiomyoma 2006    Past Surgical History:  Procedure Laterality Date   ABDOMINAL HYSTERECTOMY  04/06/04   APPENDECTOMY     BILATERAL SALPINGOOPHORECTOMY  04/06/04   CARDIAC EVENT MONITOR  02/2016   Mostly sinus bradycardia and sinus rhythm.  Rare PVCs and PACs.  No arrhythmias.  Symptoms of chest pain and fluttering as well as "passed out spell" noted with normal sinus rhythm.   MASS EXCISION Left 06/02/2015   Procedure: EXCISION LEFT ARM MASS;  Surgeon: Autumn Messing III, MD;   Location: [facility];  Service: General;  Laterality: Left;   NM MYOVIEW LTD  04/2016   Reached heart rate of 127 bpm with Lexiscan.  EF 60 to 65%.  LOW RISK.  No ischemia or infarction.   ROTATOR CUFF REPAIR     TRANSTHORACIC ECHOCARDIOGRAM  01/2016   F 55-6%.  No R WMA.  GR 1 DD.  Normal valves.    Vitals:   05/03/21 0849  BP: 140/82  Pulse: 78     Subjective Assessment - 05/03/21 0849     Subjective Pt reports thta this weekend was rough. She reports that back was really bothering her. Pt has not heard from pain management still. Discussed calling Dr. Delice Lesch and finding out who referral was sent to. She goes back to cardiologist soon.    Patient is accompained by: Family member   husband   Pertinent History anemia, anxiety, depression, HTN, HLD, migraine- stress triggered.    Patient Stated Goals Pt wants to be able to go to work.    Currently in Pain? Yes    Pain Score 10-Worst pain ever    Pain Location Back    Pain Orientation Lower;Left    Pain Descriptors / Indicators Aching;Shooting    Pain Type Chronic pain    Pain Radiating Towards down leg to knee  Pain Onset More than a month ago    Pain Frequency Constant    Pain Relieving Factors heat    Pain Onset More than a month ago                               [facility] Adult PT Treatment/Exercise - 05/03/21 0855       Transfers   Transfers Sit to Stand;Stand to Sit    Sit to Stand 5: Supervision;With upper extremity assist    Stand to Sit 5: Supervision      Ambulation/Gait   Ambulation/Gait Yes    Ambulation/Gait Assistance 5: Supervision    Ambulation/Gait Assistance Details Verbal cues to slow down right step and increase left stance time. Pt improved as went on.    Ambulation Distance (Feet) 345 Feet    Assistive device Straight cane   with quad tip   Gait Pattern Step-through pattern;Decreased stance time - left;Antalgic    Ambulation Surface Level;Indoor      Exercises    Exercises Other Exercises;Knee/Hip      Knee/Hip Exercises: Aerobic   Nustep x 8 min level 3 with BLE and BUE for strengthening and aerobic training. Pain down to 5/10 at end. BP=150/84 after                     PT Education - 05/03/21 1133     Education Details Discussed reassessment next week and will determine plan based on that. Discussed benefit of more aerobic activity. Encouraged pt not to sit or lay more than 20 minutes during day but to change position.    Person(s) Educated Patient    Methods Explanation    Comprehension Verbalized understanding              PT Short Term Goals - 04/26/21 0948       PT SHORT TERM GOAL #1   Title Pt will be independent with HEP for strengthening and balance to continue gains on own.    Baseline PT continues to add to HEP. Pt has been working on current exercises.    Time 4    Period Weeks    Status Partially Met    Target Date 04/20/21      PT SHORT TERM GOAL #2   Title Pt will ambulate >500' without AD on level surfaces independently for improved household and short community distances.    Baseline 500' with seated rest break @ 330' without a/d, limited due to L hip pain    Time 4    Period Weeks    Status Partially Met    Target Date 04/20/21      PT SHORT TERM GOAL #3   Title Functional testing will be completed at follow-up visit and LTGs updated.    Baseline 03/29/21 PT completed 5 x sit to stand, gait speed and TUG    Time 4    Period Weeks    Status Achieved    Target Date 04/20/21               PT Long Term Goals - 04/19/21 1301       PT LONG TERM GOAL #1   Title Pt will ambulate >500' on varied surfaces with cane supervision for improved short community distances. (LTGs due 05/12/21)    Time 8    Period Weeks    Status On-going    Target Date 05/12/21  PT LONG TERM GOAL #2   Title Pt will decrease 5 x sit to stand from 30.47 sec to <25 sec for improved balance and functional strength.     Baseline 04/19/21 27 sec without hands from mat table    Time 8    Period Weeks    Status On-going    Target Date 05/12/21      PT LONG TERM GOAL #3   Title Pt will increase gait speed from 0.59ms to >0.744m for improved community mobility.    Baseline 03/29/21 0.5530m   Time 8    Period Weeks    Status On-going    Target Date 05/12/21      PT LONG TERM GOAL #4   Title Pt will decrease TUG from 21.29 sec to <17 sec for improved balance and functional mobility.    Baseline 03/29/21 21.29 sec without AD; 04/19/21 26.63 sec without a/d    Time 8    Period Weeks    Status On-going    Target Date 05/12/21                   Plan - 05/03/21 1135     Clinical Impression Statement Pt's pain was able to decrease throughout session after walking and time on NuStep working on more aerobic activity. Continued to encourage more movement versus sitting/laying for too long.    Personal Factors and Comorbidities Comorbidity 3+;Behavior Pattern    Comorbidities anemia, anxiety, depression, HTN, HLD, migraine,    Examination-Activity Limitations Stairs;Locomotion Level;Transfers    Examination-Participation Restrictions Community Activity;Occupation;Meal Prep;Cleaning;Laundry    Stability/Clinical Decision Making Evolving/Moderate complexity    Rehab Potential Fair    PT Frequency 1x / week    PT Duration 8 weeks    PT Treatment/Interventions DME Instruction;Gait training;Stair training;Functional mobility training;Therapeutic activities;Therapeutic exercise;Balance training;Neuromuscular re-education;Passive range of motion;Patient/family education;Taping;Dry needling;Manual techniques    PT Next Visit Plan Check goals to determine recert versus discharge. Did pain management contact them yet?  continue to work on more functional activities with breaks as needed.    Consulted and Agree with Plan of Care Patient             Patient will benefit from skilled therapeutic intervention in  order to improve the following deficits and impairments:  Abnormal gait, Decreased mobility, Decreased strength, Impaired sensation, Decreased knowledge of use of DME, Decreased balance  Visit Diagnosis: Other abnormalities of gait and mobility  Muscle weakness (generalized)     Problem List Patient Active Problem List   Diagnosis Date Noted   Left-sided weakness 01/31/2021   Gout 01/31/2021   Urinary frequency 05/07/2017   Abdominal bloating 05/07/2017   Back pain 03/21/2017   Tachycardia 02/28/2017   Preventative health care 11/04/2016   Hoarseness 04/15/2016   Dysphagia 04/15/2016   Atypical chest pain 04/13/2016   Dysuria 03/04/2016   Hematuria 03/04/2016   Palpitations 01/30/2016   History of shingles 01/22/2016   Eczema 08/22/2015   Hyperlipidemia, mixed 08/22/2015   Hyperglycemia 01/15/2015   Lumbar radiculopathy 11/19/2014   GERD (gastroesophageal reflux disease) 09/05/2014   Noncompliance with medications 08/11/2014   Constipation 12/27/2013   Pain in joint, shoulder region 10/13/2013   Shoulder pain 09/23/2013   Numbness and tingling in right hand 09/23/2013   Jaw pain 03/26/2013   Neck pain 05/17/2012   [place]   Non compliance w medication regimen 02/21/2012   Essential hypertension 09/06/2011   Abdominal pain 06/20/2011   Hemorrhoids 06/20/2011  Hx of adenomatous colonic polyps 06/13/2011   Tubular adenoma of colon 05/2011   Musculoskeletal chest pain 04/23/2011   Radiculopathy of leg 02/02/2011   History of pseudoseizure 07/10/2010   Anxiety and depression 12/29/2009   TINEA PEDIS 06/02/2009   Exertional dyspnea 01/13/2008   MICROSCOPIC HEMATURIA 06/24/2007   WEIGHT LOSS 04/28/2007   Anemia 03/10/2007   Migraine 03/10/2007   ARTHRITIS 03/10/2007   SYNCOPE 03/10/2007   Menometrorrhagia 2006    Electa Sniff, PT, DPT, NCS 05/03/2021, 11:49 AM  [facility] [address] [place], [place], [address] Phone: [phone]   Fax:  [phone]  Name: Ruth Bell MRN: [id number] Date of Birth: 1960-11-28

## 2021-05-04 ENCOUNTER — Telehealth: Payer: 59 | Admitting: Family Medicine

## 2021-05-09 ENCOUNTER — Other Ambulatory Visit: Payer: Self-pay

## 2021-05-09 ENCOUNTER — Ambulatory Visit: Payer: 59 | Admitting: Occupational Therapy

## 2021-05-09 DIAGNOSIS — R2681 Unsteadiness on feet: Secondary | ICD-10-CM

## 2021-05-09 DIAGNOSIS — M79602 Pain in left arm: Secondary | ICD-10-CM

## 2021-05-09 DIAGNOSIS — M25512 Pain in left shoulder: Secondary | ICD-10-CM

## 2021-05-09 DIAGNOSIS — M6281 Muscle weakness (generalized): Secondary | ICD-10-CM

## 2021-05-09 DIAGNOSIS — R278 Other lack of coordination: Secondary | ICD-10-CM

## 2021-05-09 NOTE — Patient Instructions (Signed)
°  Coordination Activities  Perform the following activities for 20 minutes 1 times per day with left hand(s).  Rotate ball in fingertips (clockwise and counter-clockwise). Toss ball between hands. Toss ball in air and catch with the same hand. Flip cards 1 at a time as fast as you can. Deal cards with your thumb (Hold deck in hand and push card off top with thumb). Pick up coins and stack. Pick up coins one at a time until you get 5-10 in your hand, then move coins from palm to fingertips to place in container one at a time.     1. Grip Strengthening (Resistive Putty)   Squeeze putty using thumb and all fingers. Repeat _10-20___ times. Do __2__ sessions per day.   2. Roll putty into tube on table and pinch between each finger and thumb x 10 reps each. (can do ring and small finger together)     Copyright  VHI. All rights reserved.

## 2021-05-09 NOTE — Therapy (Addendum)
[facility] [address] [place], [place], [address] Phone: [phone]   Fax:  [phone]  Occupational Therapy Treatment  Patient Details  Name: Ruth Bell MRN: [id number] Date of Birth: 1961/02/13 Referring Provider (OT): Dr Delice Lesch   Encounter Date: 05/09/2021   OT End of Session - 05/09/21 1057     Visit Number 5    Number of Visits 13    Date for OT Re-Evaluation 07/06/21    Authorization Type [employer]    Authorization Time Period 12 weeks    Authorization - Visit Number 5    Authorization - Number of Visits 30    OT Start Time 1017    OT Stop Time 1100    OT Time Calculation (min) 43 min    Activity Tolerance Patient tolerated treatment well    Behavior During Therapy [facility] for tasks assessed/performed             Past Medical History:  Diagnosis Date   Anemia    Anxiety    Arm skin lesion, left 09/05/2014   Constipation 12/27/2013   Depression    Dysphagia 04/15/2016   Eczema 08/22/2015   GERD (gastroesophageal reflux disease)    Hematuria 03/04/2016   History of shingles 01/22/2016   Hyperglycemia 01/15/2015   Hyperlipidemia, mixed 08/22/2015   Hypertension    Menometrorrhagia 2006   Migraine, unspecified, without mention of intractable migraine without mention of status migrainosus    Preventative health care 11/04/2016   Pseudoseizures ([place])    Seizures ([place])    history of pseudoseizure disorder, last last seizure 3 wks, keppra inc in dosage   Tubular adenoma of colon 05/2011   Uterine leiomyoma 2006    Past Surgical History:  Procedure Laterality Date   ABDOMINAL HYSTERECTOMY  04/06/04   APPENDECTOMY     BILATERAL SALPINGOOPHORECTOMY  04/06/04   CARDIAC EVENT MONITOR  02/2016   Mostly sinus bradycardia and sinus rhythm.  Rare PVCs and PACs.  No arrhythmias.  Symptoms of chest pain and fluttering as well as "passed out spell" noted with normal sinus rhythm.   MASS EXCISION Left  06/02/2015   Procedure: EXCISION LEFT ARM MASS;  Surgeon: Autumn Messing III, MD;  Location: [facility];  Service: General;  Laterality: Left;   NM MYOVIEW LTD  04/2016   Reached heart rate of 127 bpm with Lexiscan.  EF 60 to 65%.  LOW RISK.  No ischemia or infarction.   ROTATOR CUFF REPAIR     TRANSTHORACIC ECHOCARDIOGRAM  01/2016   F 55-6%.  No R WMA.  GR 1 DD.  Normal valves.    There were no vitals filed for this visit.   Subjective Assessment - 05/09/21 1031     Pertinent History Pt was referred for left sided weakness and psychogenic nonepileptic seizure. She reports she had a stroke on 11/6 but imaging was all negative and had left sided weakness throughout including face. She reports still having some numbness. Pt reports that this is different than the seizure like activity she has had in the past. She reports 1 fall when this occurred as left leg gave out on her. She denies any vision changes.       Patient is accompained by: Family member   husband    Pertinent History anemia, anxiety, depression, HTN, HLD, migraine- stress triggered . hx of rotator cuff tear bilaterally, pt reports hx of LUE fx 1 year ago    Limitations  seizures in reponse to pain    Patient Stated Goals Get my arm back and to be able to do things on my own and not be in so much pain and be able    Currently in Pain? Yes    Pain Score 5     Pain Location Shoulder    Pain Orientation Left    Pain Descriptors / Indicators Aching    Pain Type Chronic pain    Pain Onset More than a month ago    Pain Frequency Constant    Aggravating Factors  movement    Pain Relieving Factors heat                   Treatment: Seated closed chain chest press, shoulder flexion and abduction, min v.c  10-20 reps each Functional reaching in standing to remove graded clothespins from vertical antennae with LUE, min v.c yellow-green for sustained pinch. Copying small peg design with LUe, min difficulty/ v.c Pt  folded pillowcases using bilateral UE's today. Pt demonstrates improved overall LUE functional use today.               OT Education - 05/09/21 1055     Education Details coordination and putty HEP, see pt instructions, pt returned demonstration, min v.c for positioning    Person(s) Educated Patient    Methods Explanation;Demonstration;Verbal cues;Handout    Comprehension Verbalized understanding;Returned demonstration;Verbal cues required              OT Short Term Goals - 04/13/21 1134       OT SHORT TERM GOAL #1   Title I with inital HEP    Time 6    Period Weeks    Status New    Target Date 05/25/21      OT SHORT TERM GOAL #2   Title Pt will use LUE as a non dominant assist at least 25% of the time for ADLs with pain no greater than 6/10    Time 6    Period Weeks    Status New      OT SHORT TERM GOAL #3   Title Pt will demonstrate 65 shoulder flexion in prep for functional reach    Baseline 50*    Time 6    Period Weeks    Status New      OT SHORT TERM GOAL #4   Title Pt will demonstrate improved fine motor coordination as evidenced by decreasing 9 hole peg test score to 42 secs or less    Baseline RUE 19.45, LUE 45 secs    Time 6    Period Weeks    Status New               OT Long Term Goals - 04/13/21 1210       OT LONG TERM GOAL #1   Title I with updated HEP    Time 12    Period Weeks    Status New    Target Date 07/06/21      OT LONG TERM GOAL #2   Title Pt will use LUE as a non dominant assist at least 50% of the time for ADLS with pain less than or equal to 4/10    Time 12    Period Weeks    Status New      OT LONG TERM GOAL #3   Title Pt. will demonstrate 75* shoulder flexion for LUE in prep for functional use.    Time 12  Period Weeks    Status New      OT LONG TERM GOAL #4   Title Pt will increase LUE grip strength by 5 lbs for increased LUE functional use during ADLS    Time 12    Period Weeks    Status New       OT LONG TERM GOAL #5   Title Pt. will demonstrate LUE elbow flexion WFLS for ADLS/IADLs.    Time 12    Period Weeks    Status New                 Plan - 05/16/21 1006     Clinical Impression Statement Pt is progressing towards goals. Pt is gradaully incorporating LUE into light functional tasks. Pt's pain appears to be improved.    OT Occupational Profile and History Detailed Assessment- Review of Records and additional review of physical, cognitive, psychosocial history related to current functional performance    Occupational performance deficits (Please refer to evaluation for details): ADL's;IADL's;Rest and Sleep;Leisure;Social Participation;Work    Marketing executive / Function / Physical Skills ADL;UE functional use;Balance;Flexibility;Pain;FMC;Gait;ROM;GMC;Coordination;Sensation;Decreased knowledge of precautions;Decreased knowledge of use of DME;IADL;Dexterity;Mobility;Strength    Rehab Potential Fair    Clinical Decision Making Several treatment options, min-mod task modification necessary    Comorbidities Affecting Occupational Performance: May have comorbidities impacting occupational performance    Modification or Assistance to Complete Evaluation  Min-Moderate modification of tasks or assist with assess necessary to complete eval    OT Frequency 1x / week    OT Duration 12 weeks    OT Treatment/Interventions Self-care/ADL training;Visual/perceptual remediation/compensation;Ultrasound;Patient/family education;DME and/or AE instruction;Paraffin;Passive range of motion;Balance training;Fluidtherapy;Cryotherapy;Functional Mobility Training;Splinting;Therapeutic activities;Manual Therapy;Therapeutic exercise;Moist Heat;Neuromuscular education    Plan LUE functional use, cane exercises, check on how coordiantion/ putty HEP is going.    Consulted and Agree with Plan of Care Patient                 Patient will benefit from skilled therapeutic intervention in order to  improve the following deficits and impairments:pain, decreased Ue functional use, decreased strength, decreased functional mobility           Visit Diagnosis: Muscle weakness (generalized)  Left shoulder pain, unspecified chronicity  Left arm pain  Other lack of coordination  Unsteadiness on feet    Problem List Patient Active Problem List   Diagnosis Date Noted   Left-sided weakness 01/31/2021   Gout 01/31/2021   Urinary frequency 05/07/2017   Abdominal bloating 05/07/2017   Back pain 03/21/2017   Tachycardia 02/28/2017   Preventative health care 11/04/2016   Hoarseness 04/15/2016   Dysphagia 04/15/2016   Atypical chest pain 04/13/2016   Dysuria 03/04/2016   Hematuria 03/04/2016   Palpitations 01/30/2016   History of shingles 01/22/2016   Eczema 08/22/2015   Hyperlipidemia, mixed 08/22/2015   Hyperglycemia 01/15/2015   Lumbar radiculopathy 11/19/2014   GERD (gastroesophageal reflux disease) 09/05/2014   Noncompliance with medications 08/11/2014   Constipation 12/27/2013   Pain in joint, shoulder region 10/13/2013   Shoulder pain 09/23/2013   Numbness and tingling in right hand 09/23/2013   Jaw pain 03/26/2013   Neck pain 05/17/2012   Seizure disorder ([place]) 02/21/2012   Non compliance w medication regimen 02/21/2012   Essential hypertension 09/06/2011   Abdominal pain 06/20/2011   Hemorrhoids 06/20/2011   Hx of adenomatous colonic polyps 06/13/2011   Tubular adenoma of colon 05/2011   Musculoskeletal chest pain 04/23/2011   Radiculopathy of leg 02/02/2011   History  of pseudoseizure 07/10/2010   Anxiety and depression 12/29/2009   TINEA PEDIS 06/02/2009   Exertional dyspnea 01/13/2008   MICROSCOPIC HEMATURIA 06/24/2007   WEIGHT LOSS 04/28/2007   Anemia 03/10/2007   Migraine 03/10/2007   ARTHRITIS 03/10/2007   SYNCOPE 03/10/2007   Menometrorrhagia 2006    Elijah Michaelis, OT 05/09/2021, 11:13 AM  [facility] [address] [place], [place], [address] Phone: [phone]   Fax:  [phone]  Name: Ruth Bell MRN: [id number] Date of Birth: 04-18-1960

## 2021-05-10 ENCOUNTER — Ambulatory Visit: Payer: 59

## 2021-05-10 DIAGNOSIS — R2689 Other abnormalities of gait and mobility: Secondary | ICD-10-CM

## 2021-05-10 DIAGNOSIS — R2681 Unsteadiness on feet: Secondary | ICD-10-CM

## 2021-05-10 DIAGNOSIS — M6281 Muscle weakness (generalized): Secondary | ICD-10-CM

## 2021-05-10 NOTE — Therapy (Signed)
[facility] [address] [place], [place], [address] Phone: [phone]   Fax:  [phone]  Physical Therapy Treatment/ Recert  Patient Details  Name: Ruth Bell MRN: [id number] Date of Birth: 1960-06-04 Referring Provider (PT): Ellouise Newer   Encounter Date: 05/10/2021   PT End of Session - 05/10/21 0847     Visit Number 8    Number of Visits 16    Date for PT Re-Evaluation 07/07/21    Authorization Type [employer]    PT Start Time 0845    PT Stop Time 0929    PT Time Calculation (min) 44 min    Equipment Utilized During Treatment Gait belt    Activity Tolerance Patient tolerated treatment well    Behavior During Therapy [facility] for tasks assessed/performed             Past Medical History:  Diagnosis Date   Anemia    Anxiety    Arm skin lesion, left 09/05/2014   Constipation 12/27/2013   Depression    Dysphagia 04/15/2016   Eczema 08/22/2015   GERD (gastroesophageal reflux disease)    Hematuria 03/04/2016   History of shingles 01/22/2016   Hyperglycemia 01/15/2015   Hyperlipidemia, mixed 08/22/2015   Hypertension    Menometrorrhagia 2006   Migraine, unspecified, without mention of intractable migraine without mention of status migrainosus    Preventative health care 11/04/2016   Pseudoseizures ([place])    Seizures ([place])    history of pseudoseizure disorder, last last seizure 3 wks, keppra inc in dosage   Tubular adenoma of colon 05/2011   Uterine leiomyoma 2006    Past Surgical History:  Procedure Laterality Date   ABDOMINAL HYSTERECTOMY  04/06/04   APPENDECTOMY     BILATERAL SALPINGOOPHORECTOMY  04/06/04   CARDIAC EVENT MONITOR  02/2016   Mostly sinus bradycardia and sinus rhythm.  Rare PVCs and PACs.  No arrhythmias.  Symptoms of chest pain and fluttering as well as "passed out spell" noted with normal sinus rhythm.   MASS EXCISION Left 06/02/2015   Procedure: EXCISION LEFT ARM MASS;  Surgeon:  Autumn Messing III, MD;  Location: [facility];  Service: General;  Laterality: Left;   NM MYOVIEW LTD  04/2016   Reached heart rate of 127 bpm with Lexiscan.  EF 60 to 65%.  LOW RISK.  No ischemia or infarction.   ROTATOR CUFF REPAIR     TRANSTHORACIC ECHOCARDIOGRAM  01/2016   F 55-6%.  No R WMA.  GR 1 DD.  Normal valves.    There were no vitals filed for this visit.   Subjective Assessment - 05/10/21 0847     Subjective Pt reports that she doing good. Able to raise left arm overhead now. Denies any pain. Still has not heard anything from pain management. Pt reports that the things that are more challenging as getting up/down stairs and in/out of car.    Patient is accompained by: Family member   husband   Pertinent History anemia, anxiety, depression, HTN, HLD, migraine- stress triggered.    Patient Stated Goals Pt wants to be able to go to work.    Currently in Pain? No/denies    Pain Onset More than a month ago    Pain Onset More than a month ago                [facility] PT Assessment - 05/10/21 0849       Assessment   Medical  Diagnosis left sided weakness, psychogenic nonepileptic seizure    Referring Provider (PT) Ellouise Newer    Onset Date/Surgical Date 01/22/21                           [facility] Adult PT Treatment/Exercise - 05/10/21 0849       Transfers   Transfers Sit to Stand;Stand to Sit    Sit to Stand 5: Supervision    Five time sit to stand comments  27.11 sec from mat without hands    Stand to Sit 5: Supervision      Ambulation/Gait   Ambulation/Gait Yes    Ambulation/Gait Assistance 5: Supervision    Ambulation/Gait Assistance Details Pt had improved gait quality as went on with less antalgic gait on left. Back pain 7/10 at end. Improves as sits.    Ambulation Distance (Feet) 850 Feet    Assistive device Straight cane   with quad tip   Gait Pattern Step-through pattern;Decreased stance time - left;Decreased hip/knee flexion - left     Ambulation Surface Level;Unlevel;Indoor;Outdoor;Paved;Grass    Gait velocity comfortable=17.26 sec=0.20ms and fast=15.28=0.630m    Stairs Yes    Stairs Assistance 5: Supervision;4: Min guard    Stairs Assistance Details (indicate cue type and reason) first with right rail assist with step-to pattern leading up with right and down with right. second 4 steps performed with cane on left and right rail with step-to pattern with having pt lead down with weaker left leg. Pt reported this did feel better. Verbal cues for sequencing with the cane.    Stair Management Technique One rail Right;Step to pattern;With cane    Number of Stairs 8    Height of Stairs 6      Standardized Balance Assessment   Standardized Balance Assessment Timed Up and Go Test      Timed Up and Go Test   TUG Normal TUG    Normal TUG (seconds) 17.98   18.13 sec, 17.84 sec without AD     Exercises   Exercises Other Exercises      Knee/Hip Exercises: Aerobic   Nustep x 8 min level 4 with BUE and BLE for strengthening and improving activity tolerance. Pt reported low back a little painful at end for in general felt good.                     PT Education - 05/10/21 1034     Education Details Recert plan. Results of testing.    Person(s) Educated Patient    Methods Explanation    Comprehension Verbalized understanding              PT Short Term Goals - 04/26/21 0948       PT SHORT TERM GOAL #1   Title Pt will be independent with HEP for strengthening and balance to continue gains on own.    Baseline PT continues to add to HEP. Pt has been working on current exercises.    Time 4    Period Weeks    Status Partially Met    Target Date 04/20/21      PT SHORT TERM GOAL #2   Title Pt will ambulate >500' without AD on level surfaces independently for improved household and short community distances.    Baseline 500' with seated rest break @ 330' without a/d, limited due to L hip pain    Time 4     Period Weeks  Status Partially Met    Target Date 04/20/21      PT SHORT TERM GOAL #3   Title Functional testing will be completed at follow-up visit and LTGs updated.    Baseline 03/29/21 PT completed 5 x sit to stand, gait speed and TUG    Time 4    Period Weeks    Status Achieved    Target Date 04/20/21               PT Long Term Goals - 05/10/21 0851       PT LONG TERM GOAL #1   Title Pt will ambulate >500' on varied surfaces with cane supervision for improved short community distances. (LTGs due 05/12/21)    Baseline 05/10/21 850' supervision with cane on varied surfaces outside.    Time 8    Period Weeks    Status Achieved    Target Date 05/12/21      PT LONG TERM GOAL #2   Title Pt will decrease 5 x sit to stand from 30.47 sec to <25 sec for improved balance and functional strength.    Baseline 04/19/21 27 sec without hands from mat table. 05/10/21 27.11 sec    Time 8    Period Weeks    Status On-going    Target Date 05/12/21      PT LONG TERM GOAL #3   Title Pt will increase gait speed from 0.52ms to >0.775m for improved community mobility.    Baseline 03/29/21 0.5583m 05/10/21 0.77m44momfortable and 0.74m/29mst    Time 8    Period Weeks    Status Partially Met    Target Date 05/12/21      PT LONG TERM GOAL #4   Title Pt will decrease TUG from 21.29 sec to <17 sec for improved balance and functional mobility.    Baseline 03/29/21 21.29 sec without AD; 04/19/21 26.63 sec without a/d. 05/10/21 17.9 sec    Time 8    Period Weeks    Status Partially Met    Target Date 05/12/21             Updated PT goals:  PT Short Term Goals - 05/10/21 1049       PT SHORT TERM GOAL #1   Title Pt will be independent with progressive HEP for strengthening and balance to continue gains on own.    Time 4    Period Weeks    Status Revised    Target Date 06/07/21      PT SHORT TERM GOAL #2   Title Pt will decrease TUG from 17.98 sec to <15 sec for improved balance  and functional mobility.    Baseline 05/10/21 17.98 sec without AD    Time 4    Period Weeks    Status New    Target Date 06/07/21      PT SHORT TERM GOAL #3   Title Pt will be able to get in/out of car lifting left leg on own for improved mobility.    Time 4    Period Weeks    Status New    Target Date 06/07/21             PT Long Term Goals - 05/10/21 1050       PT LONG TERM GOAL #1   Title Pt will ambulate >1000' on varied surfaces without AD independently for improved community ambulation. (LTGs due 07/07/21)    Baseline 05/10/21 850' supervision with cane on varied  surfaces outside.    Time 8    Period Weeks    Status New    Target Date 07/07/21      PT LONG TERM GOAL #2   Title Pt will decrease 5 x sit to stand from 30.47 sec to <24 sec for improved balance and functional strength.    Baseline 04/19/21 27 sec without hands from mat table. 05/10/21 27.11 sec    Time 8    Period Weeks    Status On-going    Target Date 07/07/21      PT LONG TERM GOAL #3   Title Pt will increase gait speed from 0.78ms to >0.864m for improved community mobility.    Baseline 03/29/21 0.5555m 05/10/21 0.32m40momfortable and 0.81m/74mst    Time 8    Period Weeks    Status Revised    Target Date 07/07/21      PT LONG TERM GOAL #4   Title Pt will ambulate up/down 4 steps with cane with reciprocal pattern for improved functional strength and home access.    Time 8    Period Weeks    Status New    Target Date 07/07/21                  Plan - 05/10/21 1040     Clinical Impression Statement PT assessed LTGs today with pt fully achieving 1/4 but partially meeting 2 more showing improvement. She ambulated >800' on varied outdoor surfaces with cane with less antalgic gait and better left hip/knee flexion supervision. She decreased TUG to 17.98 sec just short of goal showing improving balance but still fall risk. 5 x sit to stand did not improve from last assessment with score of  27.11 sec. Increased gait speed to 0.32m/s51mhout AD comfortable and 0.81m/s 69m. This indicates safe for household mobility but still limited community ambulator. Pt will continue to benefit from skilled PT to continue to progress strength in LLE, balance and functional mobility.    Personal Factors and Comorbidities Comorbidity 3+;Behavior Pattern    Comorbidities anemia, anxiety, depression, HTN, HLD, migraine,    Examination-Activity Limitations Stairs;Locomotion Level;Transfers    Examination-Participation Restrictions Community Activity;Occupation;Meal Prep;Cleaning;Laundry    Stability/Clinical Decision Making Evolving/Moderate complexity    Rehab Potential Fair    PT Frequency 1x / week    PT Duration 8 weeks    PT Treatment/Interventions DME Instruction;Gait training;Stair training;Functional mobility training;Therapeutic activities;Therapeutic exercise;Balance training;Neuromuscular re-education;Passive range of motion;Patient/family education;Taping;Dry needling;Manual techniques    PT Next Visit Plan Did pain management contact them yet?  continue to work on more functional activities with breaks as needed. LLE strengthening, dynamic gait and balance.    Consulted and Agree with Plan of Care Patient             Patient will benefit from skilled therapeutic intervention in order to improve the following deficits and impairments:  Abnormal gait, Decreased mobility, Decreased strength, Impaired sensation, Decreased knowledge of use of DME, Decreased balance  Visit Diagnosis: Other abnormalities of gait and mobility  Muscle weakness (generalized)  Unsteadiness on feet     Problem List Patient Active Problem List   Diagnosis Date Noted   Left-sided weakness 01/31/2021   Gout 01/31/2021   Urinary frequency 05/07/2017   Abdominal bloating 05/07/2017   Back pain 03/21/2017   Tachycardia 02/28/2017   Preventative health care 11/04/2016   Hoarseness 04/15/2016    Dysphagia 04/15/2016   Atypical chest pain 04/13/2016   Dysuria 03/04/2016  Hematuria 03/04/2016   Palpitations 01/30/2016   History of shingles 01/22/2016   Eczema 08/22/2015   Hyperlipidemia, mixed 08/22/2015   Hyperglycemia 01/15/2015   Lumbar radiculopathy 11/19/2014   GERD (gastroesophageal reflux disease) 09/05/2014   Noncompliance with medications 08/11/2014   Constipation 12/27/2013   Pain in joint, shoulder region 10/13/2013   Shoulder pain 09/23/2013   Numbness and tingling in right hand 09/23/2013   Jaw pain 03/26/2013   Neck pain 05/17/2012   Seizure disorder ([place]) 02/21/2012   Non compliance w medication regimen 02/21/2012   Essential hypertension 09/06/2011   Abdominal pain 06/20/2011   Hemorrhoids 06/20/2011   Hx of adenomatous colonic polyps 06/13/2011   Tubular adenoma of colon 05/2011   Musculoskeletal chest pain 04/23/2011   Radiculopathy of leg 02/02/2011   History of pseudoseizure 07/10/2010   Anxiety and depression 12/29/2009   TINEA PEDIS 06/02/2009   Exertional dyspnea 01/13/2008   MICROSCOPIC HEMATURIA 06/24/2007   WEIGHT LOSS 04/28/2007   Anemia 03/10/2007   Migraine 03/10/2007   ARTHRITIS 03/10/2007   SYNCOPE 03/10/2007   Menometrorrhagia 2006    Electa Sniff, PT, DPT, NCS 05/10/2021, 10:46 AM  [facility] [address] [place], [place], [address] Phone: [phone]   Fax:  [phone]  Name: KHALIYA GOLINSKI MRN: [id number] Date of Birth: Mar 13, 1961

## 2021-05-11 ENCOUNTER — Telehealth: Payer: Self-pay | Admitting: Family Medicine

## 2021-05-11 ENCOUNTER — Telehealth (INDEPENDENT_AMBULATORY_CARE_PROVIDER_SITE_OTHER): Payer: 59 | Admitting: Medical

## 2021-05-11 ENCOUNTER — Other Ambulatory Visit: Payer: Self-pay | Admitting: Family Medicine

## 2021-05-11 VITALS — BP 140/60 | HR 97

## 2021-05-11 DIAGNOSIS — R739 Hyperglycemia, unspecified: Secondary | ICD-10-CM

## 2021-05-11 DIAGNOSIS — M545 Low back pain, unspecified: Secondary | ICD-10-CM | POA: Diagnosis not present

## 2021-05-11 DIAGNOSIS — R002 Palpitations: Secondary | ICD-10-CM

## 2021-05-11 DIAGNOSIS — I10 Essential (primary) hypertension: Secondary | ICD-10-CM | POA: Diagnosis not present

## 2021-05-11 MED ORDER — TIZANIDINE HCL 2 MG PO TABS: 2.0000 mg | ORAL_TABLET | Freq: Four times a day (QID) | ORAL | 1 refills | Status: DC | PRN

## 2021-05-11 MED ORDER — ESOMEPRAZOLE MAGNESIUM 40 MG PO CPDR: 40.0000 mg | DELAYED_RELEASE_CAPSULE | Freq: Every day | ORAL | 0 refills | Status: DC

## 2021-05-11 NOTE — Telephone Encounter (Signed)
Pt husband is requesting refills. He is aware this was prescribed by a different dr. Please advise pt if this can be filled.   Medication: esomeprazole (NEXIUM) 40 MG capsule   tiZANidine (ZANAFLEX) 2 MG tablet  Has the patient contacted their pharmacy? No.   Preferred Pharmacy: [id number] - [place], [place]  [address] [place] [address]  Phone:  [phone]  Fax:  [phone]

## 2021-05-11 NOTE — Telephone Encounter (Signed)
Patient last seen on 01/31/21 by Dr. Charlett Blake.

## 2021-05-11 NOTE — Progress Notes (Signed)
° °  Subjective:    Patient ID: Ruth Bell, female    DOB: Jan 08, 1961, 61 y.o.   MRN: [id number]  HPI  Virtual Visit via Video Note  I connected with Ruth Bell on 05/11/21 at 10:00 AM EST by a video enabled telemedicine application and verified that I am speaking with the correct person using two identifiers.  Location: Patient: home Provider: office   I discussed the limitations of evaluation and management by telemedicine and the availability of in person appointments. The patient expressed understanding and agreed to proceed.  History of Present Illness:   htn- bp decently controlled today. On amlodipine 5 mg daily.  High cholesterol- on lipitor 10 mg daily.  Jerrye Bushy- doing well with nexium 40 mg daily.  Pt has hx of palpitation occassionally and zio patch did show some episodes of svt. Ordered by cardiologist. Pt has appt with cardiologist in September.  Constipation occurring intermittently. Has to use dulcolax every other day.  Low back pain that occurred after she started PT for post stroke.   Observations/Objective: General-no acute distress, pleasant, oriented. Lungs- on inspection lungs appear unlabored. Neck- no tracheal deviation or jvd on inspection. Neuro- gross motor function appears intact.   Assessment and Plan: Patient Instructions  Htn- bp moderate well controlled. Continue amlodipine.  Gerd controlled. Continue nexium  High cholesterol- continue atorvastatin. Future lipid.  Elevated sugar. Will place future lab to get cmp and a1c.  For constipation hydrate well and can continue dulcolax every 2nd if needed. If no bm past 3 days would recommend 1 view abdomen. Stay well hydrated.  For palpitation recommend call Dr. Bettina Gavia office and get advise on zio patch. See if other med can be added.  For back pain put in lumbar spine xray order. Can continue low dose advil 200 mg and use you zanaflex.  Follow up date to be determined after lab  review/       Mackie Pai, PA-C    Follow Up Instructions:    I discussed the assessment and treatment plan with the patient. The patient was provided an opportunity to ask questions and all were answered. The patient agreed with the plan and demonstrated an understanding of the instructions.   The patient was advised to call back or seek an in-person evaluation if the symptoms worsen or if the condition fails to improve as anticipated.     Mackie Pai, PA-C   Review of Systems     Objective:   Physical Exam        Assessment & Plan:

## 2021-05-11 NOTE — Patient Instructions (Addendum)
Htn- bp moderate well controlled. Continue amlodipine.  Gerd controlled. Continue nexium  High cholesterol- continue atorvastatin. Future lipid.  Elevated sugar. Will place future lab to get cmp and a1c.  For constipation hydrate well and can continue dulcolax every 2nd if needed. If no bm past 3 days would recommend 1 view abdomen. Stay well hydrated.  For palpitation recommend call Dr. Bettina Gavia office and get advise on zio patch. See if other med can be added.  For back pain put in lumbar spine xray order. Can continue low dose advil 200 mg and use you zanaflex.  Follow up date to be determined after lab review and imaging

## 2021-05-12 ENCOUNTER — Encounter: Payer: Self-pay | Admitting: Family Medicine

## 2021-05-12 NOTE — Telephone Encounter (Signed)
Pharmacy comment: Alternative Requested:NOT ON FORMULARY $600

## 2021-05-16 ENCOUNTER — Other Ambulatory Visit: Payer: Self-pay

## 2021-05-16 ENCOUNTER — Ambulatory Visit: Payer: 59 | Admitting: Occupational Therapy

## 2021-05-16 DIAGNOSIS — M6281 Muscle weakness (generalized): Secondary | ICD-10-CM | POA: Diagnosis not present

## 2021-05-16 DIAGNOSIS — M25512 Pain in left shoulder: Secondary | ICD-10-CM

## 2021-05-16 DIAGNOSIS — M79602 Pain in left arm: Secondary | ICD-10-CM

## 2021-05-16 DIAGNOSIS — R2681 Unsteadiness on feet: Secondary | ICD-10-CM

## 2021-05-16 DIAGNOSIS — R2689 Other abnormalities of gait and mobility: Secondary | ICD-10-CM

## 2021-05-16 NOTE — Therapy (Signed)
[facility] [address] [place], [place], [address] Phone: [phone]   Fax:  [phone]  Occupational Therapy Treatment  Patient Details  Name: Ruth Bell MRN: [id number] Date of Birth: [birth year] Referring Provider (OT): Dr Delice Lesch   Encounter Date: 05/16/2021   OT End of Session - 05/16/21 1045     Visit Number 6    Number of Visits 13    Date for OT Re-Evaluation 07/06/21    Authorization Type [employer]    Authorization Time Period 12 weeks    Authorization - Visit Number 6    Authorization - Number of Visits 30    OT Start Time 1017    OT Stop Time 1055    OT Time Calculation (min) 38 min             Past Medical History:  Diagnosis Date   Anemia    Anxiety    Arm skin lesion, left 09/05/2014   Constipation 12/27/2013   Depression    Dysphagia 04/15/2016   Eczema 08/22/2015   GERD (gastroesophageal reflux disease)    Hematuria 03/04/2016   History of shingles 01/22/2016   Hyperglycemia 01/15/2015   Hyperlipidemia, mixed 08/22/2015   Hypertension    Menometrorrhagia 2006   Migraine, unspecified, without mention of intractable migraine without mention of status migrainosus    Preventative health care 11/04/2016   Pseudoseizures ([place])    Seizures ([place])    history of pseudoseizure disorder, last last seizure 3 wks, keppra inc in dosage   Tubular adenoma of colon 05/2011   Uterine leiomyoma 2006    Past Surgical History:  Procedure Laterality Date   ABDOMINAL HYSTERECTOMY  04/06/04   APPENDECTOMY     BILATERAL SALPINGOOPHORECTOMY  04/06/04   CARDIAC EVENT MONITOR  02/2016   Mostly sinus bradycardia and sinus rhythm.  Rare PVCs and PACs.  No arrhythmias.  Symptoms of chest pain and fluttering as well as "passed out spell" noted with normal sinus rhythm.   MASS EXCISION Left 06/02/2015   Procedure: EXCISION LEFT ARM MASS;  Surgeon: Autumn Messing III, MD;  Location: [facility];   Service: General;  Laterality: Left;   NM MYOVIEW LTD  04/2016   Reached heart rate of 127 bpm with Lexiscan.  EF 60 to 65%.  LOW RISK.  No ischemia or infarction.   ROTATOR CUFF REPAIR     TRANSTHORACIC ECHOCARDIOGRAM  01/2016   F 55-6%.  No R WMA.  GR 1 DD.  Normal valves.    There were no vitals filed for this visit.   Subjective Assessment - 05/16/21 1047     Subjective  "I've been doing what you told me to do"    Pertinent History Pt was referred for left sided weakness and psychogenic nonepileptic seizure. She reports she had a stroke on 11/6 but imaging was all negative and had left sided weakness throughout including face. She reports still having some numbness. Pt reports that this is different than the seizure like activity she has had in the past. She reports 1 fall when this occurred as left leg gave out on her. She denies any vision changes.       Patient is accompained by: Family member   husband    Pertinent History anemia, anxiety, depression, HTN, HLD, migraine- stress triggered . hx of rotator cuff tear bilaterally, pt reports hx of LUE fx 1 year ago    Limitations seizures in reponse to pain  Patient Stated Goals Get my arm back and to be able to do things on my own and not be in so much pain and be able    Currently in Pain? Yes    Pain Score 4     Pain Location Shoulder    Pain Orientation Left    Pain Descriptors / Indicators Aching    Pain Type Chronic pain    Pain Onset More than a month ago    Aggravating Factors  movement    Pain Relieving Factors rest                   Treatment:Seated closed chain shoulder flexion, shoulder abduction, chest press, min v.c with cane  Functional reaching to place graded clothespins on vertical antennae with LUE for sustained pinch and reach Placing and removing graded clothespins from pegboard with LUE for increased fine motor coordination, min difficulty/ v.c to avoid compensation  Rolling ball up and down  vertical surface with bilateral UE's, min v.c Standing to perform diagonals, with bilateral UE's in corner min v.c                OT Education - 05/16/21 1055     Education Details upgraded putty exercises for sustrained grip and pinch to red, min v.c    Person(s) Educated Patient    Methods Explanation;Demonstration;Verbal cues;Handout    Comprehension Verbalized understanding;Returned demonstration;Verbal cues required              OT Short Term Goals - 05/16/21 1051       OT SHORT TERM GOAL #1   Title I with inital HEP    Time 6    Period Weeks    Status Achieved    Target Date 05/25/21      OT SHORT TERM GOAL #2   Title Pt will use LUE as a non dominant assist at least 25% of the time for ADLs with pain no greater than 6/10    Time 6    Period Weeks    Status Achieved   met uses 25% pain 4/10     OT SHORT TERM GOAL #3   Title Pt will demonstrate 65 shoulder flexion in prep for functional reach    Baseline 50*    Time 6    Period Weeks    Status Achieved      OT SHORT TERM GOAL #4   Title Pt will demonstrate improved fine motor coordination as evidenced by decreasing 9 hole peg test score to 42 secs or less    Baseline RUE 19.45, LUE 45 secs    Time 6    Period Weeks    Status Achieved   20.94              OT Long Term Goals - 05/16/21 1052       OT LONG TERM GOAL #1   Title I with updated HEP    Time 12    Period Weeks    Status On-going    Target Date 07/06/21      OT LONG TERM GOAL #2   Title Pt will use LUE as a non dominant assist at least 50% of the time for ADLS with pain less than or equal to 4/10    Time 12    Period Weeks    Status On-going      OT LONG TERM GOAL #3   Title Pt. will demonstrate 75* shoulder flexion for LUE in prep  for functional use.    Time 12    Period Weeks    Status Achieved   125*     OT LONG TERM GOAL #4   Title Pt will increase LUE grip strength by 5 lbs for increased LUE functional use during  ADLS    Time 12    Period Weeks    Status Achieved   63.2     OT LONG TERM GOAL #5   Title Pt. will demonstrate LUE elbow flexion WFLS for ADLS/IADLs.    Time 12    Period Weeks    Status On-going                   Plan - 05/16/21 1045     Clinical Impression Statement Pt is progressing towards goals. She demonstrates significant improvements in LUE functional use and pain.    OT Occupational Profile and History Detailed Assessment- Review of Records and additional review of physical, cognitive, psychosocial history related to current functional performance    Occupational performance deficits (Please refer to evaluation for details): ADL's;IADL's;Rest and Sleep;Leisure;Social Participation;Work    Marketing executive / Function / Physical Skills ADL;UE functional use;Balance;Flexibility;Pain;FMC;Gait;ROM;GMC;Coordination;Sensation;Decreased knowledge of precautions;Decreased knowledge of use of DME;IADL;Dexterity;Mobility;Strength    Rehab Potential Fair    Clinical Decision Making Several treatment options, min-mod task modification necessary    Comorbidities Affecting Occupational Performance: May have comorbidities impacting occupational performance    Modification or Assistance to Complete Evaluation  Min-Moderate modification of tasks or assist with assess necessary to complete eval    OT Frequency 1x / week    OT Duration 12 weeks    OT Treatment/Interventions Self-care/ADL training;Visual/perceptual remediation/compensation;Ultrasound;Patient/family education;DME and/or AE instruction;Paraffin;Passive range of motion;Balance training;Fluidtherapy;Cryotherapy;Functional Mobility Training;Splinting;Therapeutic activities;Manual Therapy;Therapeutic exercise;Moist Heat;Neuromuscular education    Plan LUE functional use, cane exercises,    Consulted and Agree with Plan of Care Patient             Patient will benefit from skilled therapeutic intervention in order to improve  the following deficits and impairments:   Body Structure / Function / Physical Skills: ADL, UE functional use, Balance, Flexibility, Pain, FMC, Gait, ROM, GMC, Coordination, Sensation, Decreased knowledge of precautions, Decreased knowledge of use of DME, IADL, Dexterity, Mobility, Strength       Visit Diagnosis: Muscle weakness (generalized)  Left shoulder pain, unspecified chronicity  Left arm pain  Other abnormalities of gait and mobility  Unsteadiness on feet    Problem List Patient Active Problem List   Diagnosis Date Noted   Left-sided weakness 01/31/2021   Gout 01/31/2021   Urinary frequency 05/07/2017   Abdominal bloating 05/07/2017   Back pain 03/21/2017   Tachycardia 02/28/2017   Preventative health care 11/04/2016   Hoarseness 04/15/2016   Dysphagia 04/15/2016   Atypical chest pain 04/13/2016   Dysuria 03/04/2016   Hematuria 03/04/2016   Palpitations 01/30/2016   History of shingles 01/22/2016   Eczema 08/22/2015   Hyperlipidemia, mixed 08/22/2015   Hyperglycemia 01/15/2015   Lumbar radiculopathy 11/19/2014   GERD (gastroesophageal reflux disease) 09/05/2014   Noncompliance with medications 08/11/2014   Constipation 12/27/2013   Pain in joint, shoulder region 10/13/2013   Shoulder pain 09/23/2013   Numbness and tingling in right hand 09/23/2013   Jaw pain 03/26/2013   Neck pain 05/17/2012   Seizure disorder ([place]) 02/21/2012   Non compliance w medication regimen 02/21/2012   Essential hypertension 09/06/2011   Abdominal pain 06/20/2011   Hemorrhoids 06/20/2011   Hx of adenomatous  colonic polyps 06/13/2011   Tubular adenoma of colon 05/2011   Musculoskeletal chest pain 04/23/2011   Radiculopathy of leg 02/02/2011   History of pseudoseizure 07/10/2010   Anxiety and depression 12/29/2009   TINEA PEDIS 06/02/2009   Exertional dyspnea 01/13/2008   MICROSCOPIC HEMATURIA 06/24/2007   WEIGHT LOSS 04/28/2007   Anemia 03/10/2007   Migraine 03/10/2007    ARTHRITIS 03/10/2007   SYNCOPE 03/10/2007   Menometrorrhagia 2006    Jackson Coffield, OT 05/16/2021, 10:57 AM  [facility] [address] [place], [place], [address] Phone: [phone]   Fax:  [phone]  Name: Ruth Bell MRN: [id number] Date of Birth: [birth year]

## 2021-05-19 ENCOUNTER — Other Ambulatory Visit: Payer: Self-pay

## 2021-05-19 ENCOUNTER — Ambulatory Visit: Payer: 59 | Attending: Family Medicine

## 2021-05-19 DIAGNOSIS — R278 Other lack of coordination: Secondary | ICD-10-CM | POA: Insufficient documentation

## 2021-05-19 DIAGNOSIS — R2689 Other abnormalities of gait and mobility: Secondary | ICD-10-CM | POA: Diagnosis not present

## 2021-05-19 DIAGNOSIS — M25512 Pain in left shoulder: Secondary | ICD-10-CM | POA: Diagnosis present

## 2021-05-19 DIAGNOSIS — M6281 Muscle weakness (generalized): Secondary | ICD-10-CM | POA: Diagnosis present

## 2021-05-19 DIAGNOSIS — M79602 Pain in left arm: Secondary | ICD-10-CM | POA: Diagnosis present

## 2021-05-19 DIAGNOSIS — R2681 Unsteadiness on feet: Secondary | ICD-10-CM | POA: Diagnosis present

## 2021-05-19 NOTE — Therapy (Addendum)
OUTPATIENT PHYSICAL THERAPY TREATMENT NOTE   Patient Name: Ruth Bell MRN: [id number] DOB:[birth year], 61 y.o., female Today's Date: 05/19/2021  PCP: Mosie Lukes, MD REFERRING PROVIDER: Ellouise Newer   PT End of Session - 05/19/21 0934     Visit Number 9    Number of Visits 16    Date for PT Re-Evaluation 07/07/21    Authorization Type [employer]    PT Start Time [id number]    PT Stop Time 1015    PT Time Calculation (min) 44 min    Equipment Utilized During Treatment Gait belt    Activity Tolerance Patient tolerated treatment well    Behavior During Therapy [facility] for tasks assessed/performed             Past Medical History:  Diagnosis Date   Anemia    Anxiety    Arm skin lesion, left 09/05/2014   Constipation 12/27/2013   Depression    Dysphagia 04/15/2016   Eczema 08/22/2015   GERD (gastroesophageal reflux disease)    Hematuria 03/04/2016   History of shingles 01/22/2016   Hyperglycemia 01/15/2015   Hyperlipidemia, mixed 08/22/2015   Hypertension    Menometrorrhagia 2006   Migraine, unspecified, without mention of intractable migraine without mention of status migrainosus    Preventative health care 11/04/2016   Pseudoseizures ([place])    Seizures ([place])    history of pseudoseizure disorder, last last seizure 3 wks, keppra inc in dosage   Tubular adenoma of colon 05/2011   Uterine leiomyoma 2006   Past Surgical History:  Procedure Laterality Date   ABDOMINAL HYSTERECTOMY  04/06/04   APPENDECTOMY     BILATERAL SALPINGOOPHORECTOMY  04/06/04   CARDIAC EVENT MONITOR  02/2016   Mostly sinus bradycardia and sinus rhythm.  Rare PVCs and PACs.  No arrhythmias.  Symptoms of chest pain and fluttering as well as "passed out spell" noted with normal sinus rhythm.   MASS EXCISION Left 06/02/2015   Procedure: EXCISION LEFT ARM MASS;  Surgeon: Autumn Messing III, MD;  Location: [facility];  Service: General;  Laterality: Left;   NM MYOVIEW LTD  04/2016   Reached heart  rate of 127 bpm with Lexiscan.  EF 60 to 65%.  LOW RISK.  No ischemia or infarction.   ROTATOR CUFF REPAIR     TRANSTHORACIC ECHOCARDIOGRAM  01/2016   F 55-6%.  No R WMA.  GR 1 DD.  Normal valves.   Patient Active Problem List   Diagnosis Date Noted   Left-sided weakness 01/31/2021   Gout 01/31/2021   Urinary frequency 05/07/2017   Abdominal bloating 05/07/2017   Back pain 03/21/2017   Tachycardia 02/28/2017   Preventative health care 11/04/2016   Hoarseness 04/15/2016   Dysphagia 04/15/2016   Atypical chest pain 04/13/2016   Dysuria 03/04/2016   Hematuria 03/04/2016   Palpitations 01/30/2016   History of shingles 01/22/2016   Eczema 08/22/2015   Hyperlipidemia, mixed 08/22/2015   Hyperglycemia 01/15/2015   Lumbar radiculopathy 11/19/2014   GERD (gastroesophageal reflux disease) 09/05/2014   Noncompliance with medications 08/11/2014   Constipation 12/27/2013   Pain in joint, shoulder region 10/13/2013   Shoulder pain 09/23/2013   Numbness and tingling in right hand 09/23/2013   Jaw pain 03/26/2013   Neck pain 05/17/2012   Seizure disorder ([place]) 02/21/2012   Non compliance w medication regimen 02/21/2012   Essential hypertension 09/06/2011   Abdominal pain 06/20/2011   Hemorrhoids 06/20/2011   Hx of adenomatous colonic polyps 06/13/2011  Tubular adenoma of colon 05/2011   Musculoskeletal chest pain 04/23/2011   Radiculopathy of leg 02/02/2011   History of pseudoseizure 07/10/2010   Anxiety and depression 12/29/2009   TINEA PEDIS 06/02/2009   Exertional dyspnea 01/13/2008   MICROSCOPIC HEMATURIA 06/24/2007   WEIGHT LOSS 04/28/2007   Anemia 03/10/2007   Migraine 03/10/2007   ARTHRITIS 03/10/2007   SYNCOPE 03/10/2007   Menometrorrhagia 2006    REFERRING DIAG: left sided weakness, psychogenic nonepileptic seizure  THERAPY DIAG:  Other abnormalities of gait and mobility  Muscle weakness (generalized)  Unsteadiness on feet  PERTINENT HISTORY: anemia, anxiety,  depression, HTN, HLD, migraine- stress triggered   PRECAUTIONS: fall  SUBJECTIVE: Pt reports that the last couple days she has been having some spasms in low back. Using heat and icy hot to help but has been in bed more.  PAIN:  Are you having pain? Yes NPRS scale: 9/10 Pain location: low back Pain orientation: Lower  PAIN TYPE: chronic Pain description:  spasm   Aggravating factors: change of position to get up and get in/out of car, sitting in car for too long Relieving factors: heat and icy hot    TODAY'S TREATMENT:  Pt ambulated without AD 230' with walking over blue mat marching and weaving in and out 4 cones x 2. Pt still has antalgic gait on left but decreasing. Instructed to try to increase stance time supervision level. None noted with march over mat with increase left stance time.  Car transfer mimic to chair without arms and lifting left leg over 8" bolster with turning to "get in car". Performed x 3 reps. Pt was cued to engage core to support back. Reported a little pulling underneath thigh but improved some with engaging core. Pt reported to PT she started having some pain under right breast when takes a big breath. Seems to be in rib area. Performed prone rib springing on right around T8-10 levels grade 2, 4 bouts of 20 sec. Pt reported feeling the same pain at this area. Pt hypomobile in thoracic spine T6-10 with central PA and performed 3 bouts of 20 sec at these levels grade 2/3.  -pt still reporting the pain under breast after sat back up with deep breath. Discussed self trigger point release between ribs when taking breath in and out. Also to try heat to area at home. To continue to monitor and report to MD if any worsening but does appear to be more musculoskeletal at this time. Standing balance on airex: alternating taps on cone x 10 without UE support CGA then x 10 each leg to 3 different colored cones on command. Pt was cued to stand tall and use controlled movements.   Gait 230' at end without AD with verbal cues to increase left stance time and relax arms for arm swing supervision. 6/10 pain low back at end of session. Pt still having pain under right breast in rib area.   PATIENT EDUCATION: Education details: self trigger point release to intercostals with deep breaths, heat  Person educated: Patient Education method: Customer service manager Education comprehension: verbalized understanding   HOME EXERCISE PROGRAM: [email or web address]   PT Short Term Goals - 05/10/21 1049       PT SHORT TERM GOAL #1   Title Pt will be independent with progressive HEP for strengthening and balance to continue gains on own.    Time 4    Period Weeks    Status Revised    Target Date 06/07/21  PT SHORT TERM GOAL #2   Title Pt will decrease TUG from 17.98 sec to <15 sec for improved balance and functional mobility.    Baseline 05/10/21 17.98 sec without AD    Time 4    Period Weeks    Status New    Target Date 06/07/21      PT SHORT TERM GOAL #3   Title Pt will be able to get in/out of car lifting left leg on own for improved mobility.    Time 4    Period Weeks    Status New    Target Date 06/07/21              PT Long Term Goals - 05/10/21 1050       PT LONG TERM GOAL #1   Title Pt will ambulate >1000' on varied surfaces without AD independently for improved community ambulation. (LTGs due 07/07/21)    Baseline 05/10/21 850' supervision with cane on varied surfaces outside.    Time 8    Period Weeks    Status New    Target Date 07/07/21      PT LONG TERM GOAL #2   Title Pt will decrease 5 x sit to stand from 30.47 sec to <24 sec for improved balance and functional strength.    Baseline 04/19/21 27 sec without hands from mat table. 05/10/21 27.11 sec    Time 8    Period Weeks    Status On-going    Target Date 07/07/21      PT LONG TERM GOAL #3   Title Pt will increase gait speed from 0.52m/s to >0.76m/s for improved community mobility.     Baseline 03/29/21 0.76m/s. 05/10/21 0.34m/s comfortable and 0.45m/s fast    Time 8    Period Weeks    Status Revised    Target Date 07/07/21      PT LONG TERM GOAL #4   Title Pt will ambulate up/down 4 steps with cane with reciprocal pattern for improved functional strength and home access.    Time 8    Period Weeks    Status New    Target Date 07/07/21              Plan - 05/19/21 1032     Clinical Impression Statement Pt was able to show improvement in left stance time with less antalgic gait today throughout session without AD. Continued to focus on activities to increase left hip flexion and stance time. Able to mimic car transfer with pt lifting left leg over obstacle without UE assist today. Pt reported decrease in back pain throughout session. Was reporting new pain under right breast in rib area when takes deep breath. Able to reproduce with rib springing but no change in pain. Does seem to be more musculoskeletal but advised pt to get checked if any change.    Personal Factors and Comorbidities Comorbidity 3+;Behavior Pattern    Comorbidities anemia, anxiety, depression, HTN, HLD, migraine,    Examination-Activity Limitations Stairs;Locomotion Level;Transfers    Examination-Participation Restrictions Community Activity;Occupation;Meal Prep;Cleaning;Laundry    Stability/Clinical Decision Making Evolving/Moderate complexity    Rehab Potential Fair    PT Frequency 1x / week    PT Duration 8 weeks    PT Treatment/Interventions DME Instruction;Gait training;Stair training;Functional mobility training;Therapeutic activities;Therapeutic exercise;Balance training;Neuromuscular re-education;Passive range of motion;Patient/family education;Taping;Dry needling;Manual techniques    PT Next Visit Plan Did pain management contact them yet? How is pain under right breast/rib area? continue to work on  more functional activities with breaks as needed. LLE strengthening, dynamic gait and balance  trying to increase left stance time.    Consulted and Agree with Plan of Care Patient               Electa Sniff, PT, DPT, NCS 05/19/2021, 10:36 AM

## 2021-05-23 ENCOUNTER — Ambulatory Visit: Payer: 59 | Admitting: Occupational Therapy

## 2021-05-23 ENCOUNTER — Other Ambulatory Visit: Payer: Self-pay

## 2021-05-23 DIAGNOSIS — R2689 Other abnormalities of gait and mobility: Secondary | ICD-10-CM

## 2021-05-23 DIAGNOSIS — R278 Other lack of coordination: Secondary | ICD-10-CM

## 2021-05-23 DIAGNOSIS — M79602 Pain in left arm: Secondary | ICD-10-CM

## 2021-05-23 DIAGNOSIS — M25512 Pain in left shoulder: Secondary | ICD-10-CM

## 2021-05-23 DIAGNOSIS — M6281 Muscle weakness (generalized): Secondary | ICD-10-CM

## 2021-05-23 DIAGNOSIS — R2681 Unsteadiness on feet: Secondary | ICD-10-CM

## 2021-05-23 NOTE — Therapy (Signed)
[facility] [address] [place], [place], [address] Phone: [phone]   Fax:  [phone]  Occupational Therapy Treatment  Patient Details  Name: Ruth Bell MRN: [id number] Date of Birth: 1961/03/10 Referring Provider (OT): Dr Delice Lesch   Encounter Date: 05/23/2021   OT End of Session - 05/23/21 1049     Visit Number 7    Number of Visits 13    Date for OT Re-Evaluation 07/06/21    Authorization Type [employer]    Authorization Time Period 12 weeks    Authorization - Visit Number 7    Authorization - Number of Visits 30    OT Start Time 1017    OT Stop Time 1057    OT Time Calculation (min) 40 min    Activity Tolerance Patient tolerated treatment well    Behavior During Therapy [employer] for tasks assessed/performed             Past Medical History:  Diagnosis Date   Anemia    Anxiety    Arm skin lesion, left 09/05/2014   Constipation 12/27/2013   Depression    Dysphagia 04/15/2016   Eczema 08/22/2015   GERD (gastroesophageal reflux disease)    Hematuria 03/04/2016   History of shingles 01/22/2016   Hyperglycemia 01/15/2015   Hyperlipidemia, mixed 08/22/2015   Hypertension    Menometrorrhagia 2006   Migraine, unspecified, without mention of intractable migraine without mention of status migrainosus    Preventative health care 11/04/2016   Pseudoseizures ([place])    Seizures ([place])    history of pseudoseizure disorder, last last seizure 3 wks, keppra inc in dosage   Tubular adenoma of colon 05/2011   Uterine leiomyoma 2006    Past Surgical History:  Procedure Laterality Date   ABDOMINAL HYSTERECTOMY  04/06/04   APPENDECTOMY     BILATERAL SALPINGOOPHORECTOMY  04/06/04   CARDIAC EVENT MONITOR  02/2016   Mostly sinus bradycardia and sinus rhythm.  Rare PVCs and PACs.  No arrhythmias.  Symptoms of chest pain and fluttering as well as "passed out spell" noted with normal sinus rhythm.   MASS EXCISION Left  06/02/2015   Procedure: EXCISION LEFT ARM MASS;  Surgeon: Autumn Messing III, MD;  Location: [facility];  Service: General;  Laterality: Left;   NM MYOVIEW LTD  04/2016   Reached heart rate of 127 bpm with Lexiscan.  EF 60 to 65%.  LOW RISK.  No ischemia or infarction.   ROTATOR CUFF REPAIR     TRANSTHORACIC ECHOCARDIOGRAM  01/2016   F 55-6%.  No R WMA.  GR 1 DD.  Normal valves.    There were no vitals filed for this visit.   Subjective Assessment - 05/23/21 1138     Subjective  Pt reprots severe back pain today from walking too much    Pertinent History Pt was referred for left sided weakness and psychogenic nonepileptic seizure. She reports she had a stroke on 11/6 but imaging was all negative and had left sided weakness throughout including face. She reports still having some numbness. Pt reports that this is different than the seizure like activity she has had in the past. She reports 1 fall when this occurred as left leg gave out on her. She denies any vision changes.       Patient is accompained by: Family member   husband    Pertinent History anemia, anxiety, depression, HTN, HLD, migraine- stress triggered . hx of rotator cuff  tear bilaterally, pt reports hx of LUE fx 1 year ago    Limitations seizures in reponse to pain    Patient Stated Goals Get my arm back and to be able to do things on my own and not be in so much pain and be able    Pain Score 10-Worst pain ever    Pain Location Back    Pain Orientation Lower    Pain Descriptors / Indicators Aching;Tingling    Pain Type Acute pain    Pain Radiating Towards down leg    Pain Onset More than a month ago    Pain Frequency Intermittent    Aggravating Factors  walking too mcuh    Pain Relieving Factors rest, heat                       Treatment:Seated closed chain shoulder flexion, shoulder abduction, chest press, min v.c with cane with hotpack on back x 25 mins for pain relief, no adverse  reactions. Functional reaching to place graded clothespins on vertical antennae with LUE for sustained pinch and reach Placing and removing graded clothespins from pegboard with LUE for increased fine motor coordination, min difficulty/ v.c to avoid compensation  Purdue pegboard for increased fine motor coordination functional use of LUE, only occasional v.c  to avoid compensation. Placing and removing pegs from [employer] with LUE for sustained pinch, min v.c/ min difficulty Arm bike x 5 mins level 1 for conditioning.            OT Education - 05/23/21 1139     Education Details reveiwed putty exercises for sustrained grip and pinch to red, min v.c    Person(s) Educated Patient    Methods Explanation;Demonstration;Verbal cues    Comprehension Verbalized understanding;Returned demonstration;Verbal cues required              OT Short Term Goals - 05/16/21 1051       OT SHORT TERM GOAL #1   Title I with inital HEP    Time 6    Period Weeks    Status Achieved    Target Date 05/25/21      OT SHORT TERM GOAL #2   Title Pt will use LUE as a non dominant assist at least 25% of the time for ADLs with pain no greater than 6/10    Time 6    Period Weeks    Status Achieved   met uses 25% pain 4/10     OT SHORT TERM GOAL #3   Title Pt will demonstrate 65 shoulder flexion in prep for functional reach    Baseline 50*    Time 6    Period Weeks    Status Achieved      OT SHORT TERM GOAL #4   Title Pt will demonstrate improved fine motor coordination as evidenced by decreasing 9 hole peg test score to 42 secs or less    Baseline RUE 19.45, LUE 45 secs    Time 6    Period Weeks    Status Achieved   20.94              OT Long Term Goals - 05/23/21 1047       OT LONG TERM GOAL #1   Title I with updated HEP    Time 12    Period Weeks    Status On-going    Target Date 07/06/21      OT LONG TERM GOAL #2  Title Pt will use LUE as a non dominant assist at  least 50% of the time for ADLS with pain less than or equal to 4/10    Time 12    Period Weeks    Status On-going      OT LONG TERM GOAL #3   Title Pt. will demonstrate 75* shoulder flexion for LUE in prep for functional use.    Time 12    Period Weeks    Status Achieved   125*     OT LONG TERM GOAL #4   Title Pt will increase LUE grip strength by 5 lbs for increased LUE functional use during ADLS    Time 12    Period Weeks    Status Achieved   63.2     OT LONG TERM GOAL #5   Title Pt. will demonstrate LUE elbow flexion WFLS for ADLS/IADLs.    Time 12    Period Weeks    Status On-going                   Plan - 05/23/21 1052     Clinical Impression Statement Pt is progressing towards goals. Pt demonstrates significant improvements in LUE functional use. Pt is limited by back pain today.    OT Occupational Profile and History Detailed Assessment- Review of Records and additional review of physical, cognitive, psychosocial history related to current functional performance    Occupational performance deficits (Please refer to evaluation for details): ADL's;IADL's;Rest and Sleep;Leisure;Social Participation;Work    Marketing executive / Function / Physical Skills ADL;UE functional use;Balance;Flexibility;Pain;FMC;Gait;ROM;GMC;Coordination;Sensation;Decreased knowledge of precautions;Decreased knowledge of use of DME;IADL;Dexterity;Mobility;Strength    Rehab Potential Fair    Clinical Decision Making Several treatment options, min-mod task modification necessary    Comorbidities Affecting Occupational Performance: May have comorbidities impacting occupational performance    Modification or Assistance to Complete Evaluation  Min-Moderate modification of tasks or assist with assess necessary to complete eval    OT Frequency 1x / week    OT Duration 12 weeks    OT Treatment/Interventions Self-care/ADL training;Visual/perceptual remediation/compensation;Ultrasound;Patient/family  education;DME and/or AE instruction;Paraffin;Passive range of motion;Balance training;Fluidtherapy;Cryotherapy;Functional Mobility Training;Splinting;Therapeutic activities;Manual Therapy;Therapeutic exercise;Moist Heat;Neuromuscular education    Plan consider simple cooking task, LUE functional use    Consulted and Agree with Plan of Care Patient             Patient will benefit from skilled therapeutic intervention in order to improve the following deficits and impairments:   Body Structure / Function / Physical Skills: ADL, UE functional use, Balance, Flexibility, Pain, FMC, Gait, ROM, GMC, Coordination, Sensation, Decreased knowledge of precautions, Decreased knowledge of use of DME, IADL, Dexterity, Mobility, Strength       Visit Diagnosis: Muscle weakness (generalized)  Unsteadiness on feet  Left shoulder pain, unspecified chronicity  Left arm pain  Other lack of coordination  Other abnormalities of gait and mobility    Problem List Patient Active Problem List   Diagnosis Date Noted   Left-sided weakness 01/31/2021   Gout 01/31/2021   Urinary frequency 05/07/2017   Abdominal bloating 05/07/2017   Back pain 03/21/2017   Tachycardia 02/28/2017   Preventative health care 11/04/2016   Hoarseness 04/15/2016   Dysphagia 04/15/2016   Atypical chest pain 04/13/2016   Dysuria 03/04/2016   Hematuria 03/04/2016   Palpitations 01/30/2016   History of shingles 01/22/2016   Eczema 08/22/2015   Hyperlipidemia, mixed 08/22/2015   Hyperglycemia 01/15/2015   Lumbar radiculopathy 11/19/2014   GERD (gastroesophageal reflux disease)  09/05/2014   Noncompliance with medications 08/11/2014   Constipation 12/27/2013   Pain in joint, shoulder region 10/13/2013   Shoulder pain 09/23/2013   Numbness and tingling in right hand 09/23/2013   Jaw pain 03/26/2013   Neck pain 05/17/2012   Seizure disorder ([place]) 02/21/2012   Non compliance w medication regimen 02/21/2012   Essential  hypertension 09/06/2011   Abdominal pain 06/20/2011   Hemorrhoids 06/20/2011   Hx of adenomatous colonic polyps 06/13/2011   Tubular adenoma of colon 05/2011   Musculoskeletal chest pain 04/23/2011   Radiculopathy of leg 02/02/2011   History of pseudoseizure 07/10/2010   Anxiety and depression 12/29/2009   TINEA PEDIS 06/02/2009   Exertional dyspnea 01/13/2008   MICROSCOPIC HEMATURIA 06/24/2007   WEIGHT LOSS 04/28/2007   Anemia 03/10/2007   Migraine 03/10/2007   ARTHRITIS 03/10/2007   SYNCOPE 03/10/2007   Menometrorrhagia 2006    ,, OT 05/23/2021, 11:41 AM  [facility] [address] [place], [place], [address] Phone: [phone]   Fax:  [phone]  Name: Ruth Bell MRN: [id number] Date of Birth: Mar 14, 1961

## 2021-05-26 ENCOUNTER — Other Ambulatory Visit: Payer: Self-pay

## 2021-05-26 ENCOUNTER — Ambulatory Visit: Payer: 59

## 2021-05-26 DIAGNOSIS — M6281 Muscle weakness (generalized): Secondary | ICD-10-CM

## 2021-05-26 NOTE — Therapy (Signed)
OUTPATIENT PHYSICAL THERAPY TREATMENT NOTE- arrived no charge   Patient Name: Ruth Bell MRN: [id number] DOB:Jun 07, 1960, 61 y.o., female Today's Date: 05/26/2021  PCP: Mosie Lukes, MD REFERRING PROVIDER: Ellouise Newer   PT End of Session - 05/26/21 1005     Visit Number 9    Number of Visits 16    Date for PT Re-Evaluation 07/07/21    Authorization Type [employer]    PT Start Time 1962    PT Stop Time 2297   arrived no charge   PT Time Calculation (min) 20 min    Equipment Utilized During Treatment Gait belt    Activity Tolerance Patient tolerated treatment well    Behavior During Therapy [facility] for tasks assessed/performed             Past Medical History:  Diagnosis Date   Anemia    Anxiety    Arm skin lesion, left 09/05/2014   Constipation 12/27/2013   Depression    Dysphagia 04/15/2016   Eczema 08/22/2015   GERD (gastroesophageal reflux disease)    Hematuria 03/04/2016   History of shingles 01/22/2016   Hyperglycemia 01/15/2015   Hyperlipidemia, mixed 08/22/2015   Hypertension    Menometrorrhagia 2006   Migraine, unspecified, without mention of intractable migraine without mention of status migrainosus    Preventative health care 11/04/2016   Pseudoseizures ([place])    Seizures ([place])    history of pseudoseizure disorder, last last seizure 3 wks, keppra inc in dosage   Tubular adenoma of colon 05/2011   Uterine leiomyoma 2006   Past Surgical History:  Procedure Laterality Date   ABDOMINAL HYSTERECTOMY  04/06/04   APPENDECTOMY     BILATERAL SALPINGOOPHORECTOMY  04/06/04   CARDIAC EVENT MONITOR  02/2016   Mostly sinus bradycardia and sinus rhythm.  Rare PVCs and PACs.  No arrhythmias.  Symptoms of chest pain and fluttering as well as "passed out spell" noted with normal sinus rhythm.   MASS EXCISION Left 06/02/2015   Procedure: EXCISION LEFT ARM MASS;  Surgeon: Autumn Messing III, MD;  Location: [facility];  Service: General;  Laterality: Left;    NM MYOVIEW LTD  04/2016   Reached heart rate of 127 bpm with Lexiscan.  EF 60 to 65%.  LOW RISK.  No ischemia or infarction.   ROTATOR CUFF REPAIR     TRANSTHORACIC ECHOCARDIOGRAM  01/2016   F 55-6%.  No R WMA.  GR 1 DD.  Normal valves.   Patient Active Problem List   Diagnosis Date Noted   Left-sided weakness 01/31/2021   Gout 01/31/2021   Urinary frequency 05/07/2017   Abdominal bloating 05/07/2017   Back pain 03/21/2017   Tachycardia 02/28/2017   Preventative health care 11/04/2016   Hoarseness 04/15/2016   Dysphagia 04/15/2016   Atypical chest pain 04/13/2016   Dysuria 03/04/2016   Hematuria 03/04/2016   Palpitations 01/30/2016   History of shingles 01/22/2016   Eczema 08/22/2015   Hyperlipidemia, mixed 08/22/2015   Hyperglycemia 01/15/2015   Lumbar radiculopathy 11/19/2014   GERD (gastroesophageal reflux disease) 09/05/2014   Noncompliance with medications 08/11/2014   Constipation 12/27/2013   Pain in joint, shoulder region 10/13/2013   Shoulder pain 09/23/2013   Numbness and tingling in right hand 09/23/2013   Jaw pain 03/26/2013   Neck pain 05/17/2012   Seizure disorder ([place]) 02/21/2012   Non compliance w medication regimen 02/21/2012   Essential hypertension 09/06/2011   Abdominal pain 06/20/2011   Hemorrhoids 06/20/2011  Hx of adenomatous colonic polyps 06/13/2011   Tubular adenoma of colon 05/2011   Musculoskeletal chest pain 04/23/2011   Radiculopathy of leg 02/02/2011   History of pseudoseizure 07/10/2010   Anxiety and depression 12/29/2009   TINEA PEDIS 06/02/2009   Exertional dyspnea 01/13/2008   MICROSCOPIC HEMATURIA 06/24/2007   WEIGHT LOSS 04/28/2007   Anemia 03/10/2007   Migraine 03/10/2007   ARTHRITIS 03/10/2007   SYNCOPE 03/10/2007   Menometrorrhagia 2006    REFERRING DIAG: left sided weakness, psychogenic nonepileptic seizure  THERAPY DIAG:  Muscle weakness (generalized)  PERTINENT HISTORY: anemia, anxiety, depression, HTN, HLD,  migraine- stress triggered   PRECAUTIONS: fall  SUBJECTIVE: PT went out to get pt in lobby and she got up slowly and paused. Reported needing to sit. Had seizure- like/anxiety activity. Eventually went to "sleep" with steady RR and HR. Husband declining EMS to check her out as she has history of psychogenic seizures.  Did state she was having some chest pain still. Vitals monitored throughout with HR staying 86-92 and regular. BP=160/80 once she came around. Pt with some dry heaving when she came around after about 2 minutes. Declined for EMS to come to check her out as well with PT suggesting this would be good idea. Assisted pt to w/c and in to car with husband. Advised both to call EMS if any changes and they verbalized understanding. PT will notify Dr. Delice Lesch of episode. Husband reports he has not had any "seizures" in last few weeks but was not feeling great when she got up this morning.      TODAY'S TREATMENT:   05/19/21: Pt ambulated without AD 230' with walking over blue mat marching and weaving in and out 4 cones x 2. Pt still has antalgic gait on left but decreasing. Instructed to try to increase stance time supervision level. None noted with march over mat with increase left stance time.  Car transfer mimic to chair without arms and lifting left leg over 8" bolster with turning to "get in car". Performed x 3 reps. Pt was cued to engage core to support back. Reported a little pulling underneath thigh but improved some with engaging core. Pt reported to PT she started having some pain under right breast when takes a big breath. Seems to be in rib area. Performed prone rib springing on right around T8-10 levels grade 2, 4 bouts of 20 sec. Pt reported feeling the same pain at this area. Pt hypomobile in thoracic spine T6-10 with central PA and performed 3 bouts of 20 sec at these levels grade 2/3.  -pt still reporting the pain under breast after sat back up with deep breath. Discussed self  trigger point release between ribs when taking breath in and out. Also to try heat to area at home. To continue to monitor and report to MD if any worsening but does appear to be more musculoskeletal at this time. Standing balance on airex: alternating taps on cone x 10 without UE support CGA then x 10 each leg to 3 different colored cones on command. Pt was cued to stand tall and use controlled movements.  Gait 230' at end without AD with verbal cues to increase left stance time and relax arms for arm swing supervision. 6/10 pain low back at end of session. Pt still having pain under right breast in rib area.      HOME EXERCISE PROGRAM: [id number]   PT Short Term Goals - 05/10/21 1049       PT  SHORT TERM GOAL #1   Title Pt will be independent with progressive HEP for strengthening and balance to continue gains on own.    Time 4    Period Weeks    Status Revised    Target Date 06/07/21      PT SHORT TERM GOAL #2   Title Pt will decrease TUG from 17.98 sec to <15 sec for improved balance and functional mobility.    Baseline 05/10/21 17.98 sec without AD    Time 4    Period Weeks    Status New    Target Date 06/07/21      PT SHORT TERM GOAL #3   Title Pt will be able to get in/out of car lifting left leg on own for improved mobility.    Time 4    Period Weeks    Status New    Target Date 06/07/21              PT Long Term Goals - 05/10/21 1050       PT LONG TERM GOAL #1   Title Pt will ambulate >1000' on varied surfaces without AD independently for improved community ambulation. (LTGs due 07/07/21)    Baseline 05/10/21 850' supervision with cane on varied surfaces outside.    Time 8    Period Weeks    Status New    Target Date 07/07/21      PT LONG TERM GOAL #2   Title Pt will decrease 5 x sit to stand from 30.47 sec to <24 sec for improved balance and functional strength.    Baseline 04/19/21 27 sec without hands from mat table. 05/10/21 27.11 sec    Time 8    Period  Weeks    Status On-going    Target Date 07/07/21      PT LONG TERM GOAL #3   Title Pt will increase gait speed from 0.22ms to >0.825m for improved community mobility.    Baseline 03/29/21 0.5515m 05/10/21 0.63m30momfortable and 0.59m/33mst    Time 8    Period Weeks    Status Revised    Target Date 07/07/21      PT LONG TERM GOAL #4   Title Pt will ambulate up/down 4 steps with cane with reciprocal pattern for improved functional strength and home access.    Time 8    Period Weeks    Status New    Target Date 07/07/21              Plan - 05/19/21 1032     Clinical Impression Statement Session witheld due to seizure-like activity. Pt and husband declined EMS to be called but PT advised they do call if any changes. PT sent message to Dr. AquinDelice Leschotify.   Personal Factors and Comorbidities Comorbidity 3+;Behavior Pattern    Comorbidities anemia, anxiety, depression, HTN, HLD, migraine,    Examination-Activity Limitations Stairs;Locomotion Level;Transfers    Examination-Participation Restrictions Community Activity;Occupation;Meal Prep;Cleaning;Laundry    Stability/Clinical Decision Making Evolving/Moderate complexity    Rehab Potential Fair    PT Frequency 1x / week    PT Duration 8 weeks    PT Treatment/Interventions DME Instruction;Gait training;Stair training;Functional mobility training;Therapeutic activities;Therapeutic exercise;Balance training;Neuromuscular re-education;Passive range of motion;Patient/family education;Taping;Dry needling;Manual techniques    PT Next Visit Plan Any more seizures? Did pain management contact them yet? How is pain under right breast/rib area? continue to work on more functional activities with breaks as needed. LLE strengthening, dynamic gait and balance trying  to increase left stance time.    Consulted and Agree with Plan of Care Patient               Electa Sniff, PT, DPT, NCS 05/26/2021, 10:06 AM

## 2021-05-30 ENCOUNTER — Ambulatory Visit: Payer: 59 | Admitting: Occupational Therapy

## 2021-05-30 ENCOUNTER — Other Ambulatory Visit: Payer: Self-pay

## 2021-05-30 ENCOUNTER — Encounter: Payer: Self-pay | Admitting: Occupational Therapy

## 2021-05-30 ENCOUNTER — Telehealth: Payer: Self-pay

## 2021-05-30 DIAGNOSIS — R2681 Unsteadiness on feet: Secondary | ICD-10-CM

## 2021-05-30 DIAGNOSIS — M79602 Pain in left arm: Secondary | ICD-10-CM

## 2021-05-30 DIAGNOSIS — M6281 Muscle weakness (generalized): Secondary | ICD-10-CM

## 2021-05-30 DIAGNOSIS — M25512 Pain in left shoulder: Secondary | ICD-10-CM

## 2021-05-30 DIAGNOSIS — R278 Other lack of coordination: Secondary | ICD-10-CM

## 2021-05-30 DIAGNOSIS — R2689 Other abnormalities of gait and mobility: Secondary | ICD-10-CM | POA: Diagnosis not present

## 2021-05-30 NOTE — Telephone Encounter (Signed)
Called patient to move appointment for tomorrow in [place] with Dr. Bettina Gavia. Dr. Bettina Gavia is booked out really far in [facility] and [place] and patient states she thinks she should be seen soon. She's been experiencing chest pain, flutters in her chest and she had a seizure this past Friday before PT. Please call patient and let her know a good time for her to come in. Thank you. [phone] ?

## 2021-05-30 NOTE — Patient Instructions (Signed)
?  Strengthening: Resisted Flexion ? ? ?Hold tubing with __left___ arm(s) at side. Pull forward and up. Move shoulder through pain-free range of motion. ?Repeat __10__ times per set.  Do _1-2_ sessions per day , every other day ? ? ?Strengthening: Resisted Extension ? ? ?Hold tubing in __left ___ hand(s), arm forward. Pull arm back, elbow straight. ?Repeat _10___ times per set. Do _1-2___ sessions per day, every other day. ? ? ?Resisted Horizontal Abduction: Bilateral ? ? ?Sit or stand, tubing in both hands, arms out in front. Keeping arms straight, lower arms to belly button height , pinch shoulder blades together and stretch arms out. ?Repeat _10___ times per set. Do _1-2___ sessions per day, every other day. ? ? ?Elbow Flexion: Resisted ? ? ?With tubing held in __left ____ hand(s) and other end secured under foot, curl arm up as far as possible. ?Repeat _10___ times per set. Do _1-2___ sessions per day, every other day. ? ? ? ?Elbow Extension: Resisted ? ? ?Sit in chair with resistive band secured at armrest (or hold with other hand) and ___left ____ elbow bent. Straighten elbow. ?Repeat _10___ times per set.  Do _1-2___ sessions per day, every other day. ? ? ?Copyright ? [employer]. All rights reserved.   ?

## 2021-05-30 NOTE — Therapy (Signed)
[facility] [address] [place], [place], [address] Phone: [phone]   Fax:  [phone]  Occupational Therapy Treatment  Patient Details  Name: Ruth Bell MRN: [id number] Date of Birth: 1960/11/04 Referring Provider (OT): Dr Karel Jarvis   Encounter Date: 05/30/2021   OT End of Session - 05/30/21 1111     Visit Number 8    Number of Visits 13    Date for OT Re-Evaluation 07/06/21    Authorization Type [employer]    Authorization Time Period 12 weeks    Authorization - Visit Number 8    Authorization - Number of Visits 30    OT Start Time 1017    OT Stop Time 1100    OT Time Calculation (min) 43 min    Activity Tolerance Patient tolerated treatment well    Behavior During Therapy [facility] for tasks assessed/performed             Past Medical History:  Diagnosis Date   Anemia    Anxiety    Arm skin lesion, left 09/05/2014   Constipation 12/27/2013   Depression    Dysphagia 04/15/2016   Eczema 08/22/2015   GERD (gastroesophageal reflux disease)    Hematuria 03/04/2016   History of shingles 01/22/2016   Hyperglycemia 01/15/2015   Hyperlipidemia, mixed 08/22/2015   Hypertension    Menometrorrhagia 2006   Migraine, unspecified, without mention of intractable migraine without mention of status migrainosus    Preventative health care 11/04/2016   Pseudoseizures (HCC)    Seizures (HCC)    history of pseudoseizure disorder, last last seizure 3 wks, keppra inc in dosage   Tubular adenoma of colon 05/2011   Uterine leiomyoma 2006    Past Surgical History:  Procedure Laterality Date   ABDOMINAL HYSTERECTOMY  04/06/04   APPENDECTOMY     BILATERAL SALPINGOOPHORECTOMY  04/06/04   CARDIAC EVENT MONITOR  02/2016   Mostly sinus bradycardia and sinus rhythm.  Rare PVCs and PACs.  No arrhythmias.  Symptoms of chest pain and fluttering as well as "passed out spell" noted with normal sinus rhythm.   MASS EXCISION Left  06/02/2015   Procedure: EXCISION LEFT ARM MASS;  Surgeon: Chevis Pretty III, MD;  Location: [facility];  Service: General;  Laterality: Left;   NM MYOVIEW LTD  04/2016   Reached heart rate of 127 bpm with Lexiscan.  EF 60 to 65%.  LOW RISK.  No ischemia or infarction.   ROTATOR CUFF REPAIR     TRANSTHORACIC ECHOCARDIOGRAM  01/2016   F 55-6%.  No R WMA.  GR 1 DD.  Normal valves.    There were no vitals filed for this visit.   Subjective Assessment - 05/30/21 1050     Subjective  Pt states she plans to make a cake today    Pertinent History Pt was referred for left sided weakness and psychogenic nonepileptic seizure. She reports she had a stroke on 11/6 but imaging was all negative and had left sided weakness throughout including face. She reports still having some numbness. Pt reports that this is different than the seizure like activity she has had in the past. She reports 1 fall when this occurred as left leg gave out on her. She denies any vision changes.       Patient is accompained by: Family member   husband    Pertinent History anemia, anxiety, depression, HTN, HLD, migraine- stress triggered . hx of rotator cuff tear  bilaterally, pt reports hx of LUE fx 1 year ago    Limitations seizures in reponse to pain    Patient Stated Goals Get my arm back and to be able to do things on my own and not be in so much pain and be able    Pain Score 2     Pain Location Back    Pain Orientation Lower    Pain Descriptors / Indicators Aching    Pain Type Chronic pain    Aggravating Factors  walking too much    Pain Relieving Factors rest                   treatment: Pt performed simple cooking task to scramble eggs. Pt performed task with supervision demonstrating good safety awareness. She retrieved items and washed her bowl mod I. Pt turned off the stove without cueing. Pt simulated putting a muffing pan in and out of oven mod I holding onto countertop with LUE. Pt was also  shown how to retrieve items in seated. Grooved pegs for in creased fine motor coordination with LUE, with good accuracy. Pt was instructed in yellow theraband HEP, she returned demonstration, 10 reps each, min v.c see pt instructions. Arm bike x 6 mins x level 3 for conditioning.                 OT Short Term Goals - 05/16/21 1051       OT SHORT TERM GOAL #1   Title I with inital HEP    Time 6    Period Weeks    Status Achieved    Target Date 05/25/21      OT SHORT TERM GOAL #2   Title Pt will use LUE as a non dominant assist at least 25% of the time for ADLs with pain no greater than 6/10    Time 6    Period Weeks    Status Achieved   met uses 25% pain 4/10     OT SHORT TERM GOAL #3   Title Pt will demonstrate 65 shoulder flexion in prep for functional reach    Baseline 50*    Time 6    Period Weeks    Status Achieved      OT SHORT TERM GOAL #4   Title Pt will demonstrate improved fine motor coordination as evidenced by decreasing 9 hole peg test score to 42 secs or less    Baseline RUE 19.45, LUE 45 secs    Time 6    Period Weeks    Status Achieved   20.94              OT Long Term Goals - 05/30/21 1113       OT LONG TERM GOAL #1   Title I with updated HEP    Time 12    Period Weeks    Status On-going    Target Date 07/06/21      OT LONG TERM GOAL #2   Title Pt will use LUE as a non dominant assist at least 50% of the time for ADLS with pain less than or equal to 4/10    Time 12    Period Weeks    Status On-going      OT LONG TERM GOAL #3   Title Pt. will demonstrate 75* shoulder flexion for LUE in prep for functional use.    Time 12    Period Weeks    Status Achieved  125*     OT LONG TERM GOAL #4   Title Pt will increase LUE grip strength by 5 lbs for increased LUE functional use during ADLS    Time 12    Period Weeks    Status Achieved   63.2     OT LONG TERM GOAL #5   Title Pt. will demonstrate LUE elbow flexion WFLS for  ADLS/IADLs.    Time 12    Period Weeks    Status On-going                   Plan - 05/30/21 1111     Clinical Impression Statement Pt demonstrates excellent overall progress towards goals. She perfromed simple cooking task today demonstrating good safety awareness.    OT Occupational Profile and History Detailed Assessment- Review of Records and additional review of physical, cognitive, psychosocial history related to current functional performance    Occupational performance deficits (Please refer to evaluation for details): ADL's;IADL's;Rest and Sleep;Leisure;Social Participation;Work    Games developer / Function / Physical Skills ADL;UE functional use;Balance;Flexibility;Pain;FMC;Gait;ROM;GMC;Coordination;Sensation;Decreased knowledge of precautions;Decreased knowledge of use of DME;IADL;Dexterity;Mobility;Strength    Rehab Potential Fair    Clinical Decision Making Several treatment options, min-mod task modification necessary    Comorbidities Affecting Occupational Performance: May have comorbidities impacting occupational performance    Modification or Assistance to Complete Evaluation  Min-Moderate modification of tasks or assist with assess necessary to complete eval    OT Frequency 1x / week    OT Duration 12 weeks    OT Treatment/Interventions Self-care/ADL training;Visual/perceptual remediation/compensation;Ultrasound;Patient/family education;DME and/or AE instruction;Paraffin;Passive range of motion;Balance training;Fluidtherapy;Cryotherapy;Functional Mobility Training;Splinting;Therapeutic activities;Manual Therapy;Therapeutic exercise;Moist Heat;Neuromuscular education    Plan check goals, anticipate d/c next visit    Consulted and Agree with Plan of Care Patient             Patient will benefit from skilled therapeutic intervention in order to improve the following deficits and impairments:   Body Structure / Function / Physical Skills: ADL, UE functional use,  Balance, Flexibility, Pain, FMC, Gait, ROM, GMC, Coordination, Sensation, Decreased knowledge of precautions, Decreased knowledge of use of DME, IADL, Dexterity, Mobility, Strength       Visit Diagnosis: Muscle weakness (generalized)  Unsteadiness on feet  Left shoulder pain, unspecified chronicity  Left arm pain  Other lack of coordination    Problem List Patient Active Problem List   Diagnosis Date Noted   Left-sided weakness 01/31/2021   Gout 01/31/2021   Urinary frequency 05/07/2017   Abdominal bloating 05/07/2017   Back pain 03/21/2017   Tachycardia 02/28/2017   Preventative health care 11/04/2016   Hoarseness 04/15/2016   Dysphagia 04/15/2016   Atypical chest pain 04/13/2016   Dysuria 03/04/2016   Hematuria 03/04/2016   Palpitations 01/30/2016   History of shingles 01/22/2016   Eczema 08/22/2015   Hyperlipidemia, mixed 08/22/2015   Hyperglycemia 01/15/2015   Lumbar radiculopathy 11/19/2014   GERD (gastroesophageal reflux disease) 09/05/2014   Noncompliance with medications 08/11/2014   Constipation 12/27/2013   Pain in joint, shoulder region 10/13/2013   Shoulder pain 09/23/2013   Numbness and tingling in right hand 09/23/2013   Jaw pain 03/26/2013   Neck pain 05/17/2012   Seizure disorder (HCC) 02/21/2012   Non compliance w medication regimen 02/21/2012   Essential hypertension 09/06/2011   Abdominal pain 06/20/2011   Hemorrhoids 06/20/2011   Hx of adenomatous colonic polyps 06/13/2011   Tubular adenoma of colon 05/2011   Musculoskeletal chest pain 04/23/2011   Radiculopathy  of leg 02/02/2011   History of pseudoseizure 07/10/2010   Anxiety and depression 12/29/2009   TINEA PEDIS 06/02/2009   Exertional dyspnea 01/13/2008   MICROSCOPIC HEMATURIA 06/24/2007   WEIGHT LOSS 04/28/2007   Anemia 03/10/2007   Migraine 03/10/2007   ARTHRITIS 03/10/2007   SYNCOPE 03/10/2007   Menometrorrhagia 2006    Rylan Kaufmann, OT 05/30/2021, 11:13 AM  [facility] [address] [place], [place], [address] Phone: [phone]   Fax:  [phone]  Name: ELLASANDRA ASCH MRN: [id number] Date of Birth: [birth year]

## 2021-05-31 ENCOUNTER — Ambulatory Visit: Payer: 59 | Admitting: Cardiology

## 2021-06-01 ENCOUNTER — Other Ambulatory Visit: Payer: Self-pay | Admitting: Family Medicine

## 2021-06-01 ENCOUNTER — Other Ambulatory Visit: Payer: Self-pay

## 2021-06-01 ENCOUNTER — Ambulatory Visit: Payer: 59

## 2021-06-01 DIAGNOSIS — R2689 Other abnormalities of gait and mobility: Secondary | ICD-10-CM | POA: Diagnosis not present

## 2021-06-01 NOTE — Therapy (Signed)
?OUTPATIENT PHYSICAL THERAPY TREATMENT NOTE ? ? ?Patient Name: Ruth Bell ?MRN: [id number] ?DOB:September 03, 1960, 61 y.o., female ?Today's Date: 06/01/2021 ? ?PCP: Mosie Lukes, MD ?REFERRING PROVIDER: Ellouise Newer ? ? PT End of Session - 06/01/21 1021   ? ? Visit Number 10   ? Number of Visits 16   ? Date for PT Re-Evaluation 07/07/21   ? Authorization Type [employer]   ? PT Start Time 1015   ? PT Stop Time 1056   ? PT Time Calculation (min) 41 min   ? Equipment Utilized During Treatment Gait belt   ? Activity Tolerance Patient tolerated treatment well   ? Behavior During Therapy [facility] for tasks assessed/performed   ? ?  ?  ? ?  ? ? ?Past Medical History:  ?Diagnosis Date  ? Anemia   ? Anxiety   ? Arm skin lesion, left 09/05/2014  ? Constipation 12/27/2013  ? Depression   ? Dysphagia 04/15/2016  ? Eczema 08/22/2015  ? GERD (gastroesophageal reflux disease)   ? Hematuria 03/04/2016  ? History of shingles 01/22/2016  ? Hyperglycemia 01/15/2015  ? Hyperlipidemia, mixed 08/22/2015  ? Hypertension   ? Menometrorrhagia 2006  ? Migraine, unspecified, without mention of intractable migraine without mention of status migrainosus   ? Preventative health care 11/04/2016  ? Pseudoseizures ([place])   ? Seizures ([place])   ? history of pseudoseizure disorder, last last seizure 3 wks, keppra inc in dosage  ? Tubular adenoma of colon 05/2011  ? Uterine leiomyoma 2006  ? ?Past Surgical History:  ?Procedure Laterality Date  ? ABDOMINAL HYSTERECTOMY  04/06/04  ? APPENDECTOMY    ? BILATERAL SALPINGOOPHORECTOMY  04/06/04  ? CARDIAC EVENT MONITOR  02/2016  ? Mostly sinus bradycardia and sinus rhythm.  Rare PVCs and PACs.  No arrhythmias.  Symptoms of chest pain and fluttering as well as "passed out spell" noted with normal sinus rhythm.  ? MASS EXCISION Left 06/02/2015  ? Procedure: EXCISION LEFT ARM MASS;  Surgeon: Autumn Messing III, MD;  Location: [place];  Service: General;  Laterality: Left;  ? NM MYOVIEW LTD  04/2016  ? Reached  heart rate of 127 bpm with Lexiscan.  EF 60 to 65%.  LOW RISK.  No ischemia or infarction.  ? ROTATOR CUFF REPAIR    ? TRANSTHORACIC ECHOCARDIOGRAM  01/2016  ? F 55-6%.  No R WMA.  GR 1 DD.  Normal valves.  ? ?Patient Active Problem List  ? Diagnosis Date Noted  ? Left-sided weakness 01/31/2021  ? Gout 01/31/2021  ? Urinary frequency 05/07/2017  ? Abdominal bloating 05/07/2017  ? Back pain 03/21/2017  ? Tachycardia 02/28/2017  ? Preventative health care 11/04/2016  ? Hoarseness 04/15/2016  ? Dysphagia 04/15/2016  ? Atypical chest pain 04/13/2016  ? Dysuria 03/04/2016  ? Hematuria 03/04/2016  ? Palpitations 01/30/2016  ? History of shingles 01/22/2016  ? Eczema 08/22/2015  ? Hyperlipidemia, mixed 08/22/2015  ? Hyperglycemia 01/15/2015  ? Lumbar radiculopathy 11/19/2014  ? GERD (gastroesophageal reflux disease) 09/05/2014  ? Noncompliance with medications 08/11/2014  ? Constipation 12/27/2013  ? Pain in joint, shoulder region 10/13/2013  ? Shoulder pain 09/23/2013  ? Numbness and tingling in right hand 09/23/2013  ? Jaw pain 03/26/2013  ? Neck pain 05/17/2012  ? Seizure disorder ([place]) 02/21/2012  ? Non compliance w medication regimen 02/21/2012  ? Essential hypertension 09/06/2011  ? Abdominal pain 06/20/2011  ? Hemorrhoids 06/20/2011  ? Hx of adenomatous colonic polyps 06/13/2011  ?  Tubular adenoma of colon 05/2011  ? Musculoskeletal chest pain 04/23/2011  ? Radiculopathy of leg 02/02/2011  ? History of pseudoseizure 07/10/2010  ? Anxiety and depression 12/29/2009  ? TINEA PEDIS 06/02/2009  ? Exertional dyspnea 01/13/2008  ? MICROSCOPIC HEMATURIA 06/24/2007  ? WEIGHT LOSS 04/28/2007  ? Anemia 03/10/2007  ? Migraine 03/10/2007  ? ARTHRITIS 03/10/2007  ? SYNCOPE 03/10/2007  ? Menometrorrhagia 2006  ? ? ?REFERRING DIAG: left sided weakness, psychogenic nonepileptic seizure ? ?THERAPY DIAG:  ?Other abnormalities of gait and mobility ? ?Muscle weakness (generalized) ? ?Unsteadiness on feet ? ?PERTINENT HISTORY: anemia,  anxiety, depression, HTN, HLD, migraine- stress triggered ? ? ?PRECAUTIONS: fall ? ?SUBJECTIVE: Pt reports that she is having back spasms in low back today that refer down leg when happens. Was in bed on heating pad a lot yesterday due to it. Her appointment with Dr. Bettina Gavia that was supposed to be yesterday had to be rescheduled but she doesn't know when yet. Still has not heard from pain management. PT gave her Dr. Amparo Bristol number to follow-up on where referral was sent as she put in 1/20. ? ? ? ? ? ?TODAY'S TREATMENT:  ? 06/01/21: ? HR=80.  ?NuStep x 6 min level 6 with BUE and BLE keeping steps/minute >50. ?Ambulated 81' without AD after working on arm swing and step length supervision. Pt with increased stance time on LLE today. ?At counter: reciprocal stepping over 4 cones tapping cone first to increase SLS time x 6 bouts, side stepping over cones x 6 bouts, walking over blue mat marching 6' x 6, tandem gait over blue mat 6' x 6. Close supervision with activities and cues to engage core but pt only had a couple staggers and able to correct on own for most part. ?SLS activities with soccerball: maintaining with 1 foot on ball x 20 sec each position, then moving ball forwards and back and side to side x 10 each, then alternating taps 10 x 2 all without UE support supervision. ? ? ? ? ?HOME EXERCISE PROGRAM: ?[place] ? ? PT Short Term Goals - 05/10/21 1049   ? ?  ? PT SHORT TERM GOAL #1  ? Title Pt will be independent with progressive HEP for strengthening and balance to continue gains on own.   ? Time 4   ? Period Weeks   ? Status Revised   ? Target Date 06/07/21   ?  ? PT SHORT TERM GOAL #2  ? Title Pt will decrease TUG from 17.98 sec to <15 sec for improved balance and functional mobility.   ? Baseline 05/10/21 17.98 sec without AD   ? Time 4   ? Period Weeks   ? Status New   ? Target Date 06/07/21   ?  ? PT SHORT TERM GOAL #3  ? Title Pt will be able to get in/out of car lifting left leg on own for improved  mobility.   ? Time 4   ? Period Weeks   ? Status New   ? Target Date 06/07/21   ? ?  ?  ? ?  ? ? ? PT Long Term Goals - 05/10/21 1050   ? ?  ? PT LONG TERM GOAL #1  ? Title Pt will ambulate >1000' on varied surfaces without AD independently for improved community ambulation. (LTGs due 07/07/21)   ? Baseline 05/10/21 850' supervision with cane on varied surfaces outside.   ? Time 8   ? Period Weeks   ? Status  New   ? Target Date 07/07/21   ?  ? PT LONG TERM GOAL #2  ? Title Pt will decrease 5 x sit to stand from 30.47 sec to <24 sec for improved balance and functional strength.   ? Baseline 04/19/21 27 sec without hands from mat table. 05/10/21 27.11 sec   ? Time 8   ? Period Weeks   ? Status On-going   ? Target Date 07/07/21   ?  ? PT LONG TERM GOAL #3  ? Title Pt will increase gait speed from 0.80ms to >0.864m for improved community mobility.   ? Baseline 03/29/21 0.5555m 05/10/21 0.80m32momfortable and 0.43m/43mst   ? Time 8   ? Period Weeks   ? Status Revised   ? Target Date 07/07/21   ?  ? PT LONG TERM GOAL #4  ? Title Pt will ambulate up/down 4 steps with cane with reciprocal pattern for improved functional strength and home access.   ? Time 8   ? Period Weeks   ? Status New   ? Target Date 07/07/21   ? ?  ?  ? ?  ? ? ? Plan - 05/19/21 1032   ? ? Clinical Impression Statement Pt did well with increasing left stance time with gait and activities today.  ? Personal Factors and Comorbidities Comorbidity 3+;Behavior Pattern   ? Comorbidities anemia, anxiety, depression, HTN, HLD, migraine,   ? Examination-Activity Limitations Stairs;Locomotion Level;Transfers   ? Examination-Participation Restrictions Community Activity;Occupation;Meal Prep;Cleaning;Laundry   ? Stability/Clinical Decision Making Evolving/Moderate complexity   ? Rehab Potential Fair   ? PT Frequency 1x / week   ? PT Duration 8 weeks   ? PT Treatment/Interventions DME Instruction;Gait training;Stair training;Functional mobility training;Therapeutic  activities;Therapeutic exercise;Balance training;Neuromuscular re-education;Passive range of motion;Patient/family education;Taping;Dry needling;Manual techniques   ? PT Next Visit Plan  Check STGs. Did pain mana

## 2021-06-05 ENCOUNTER — Ambulatory Visit: Payer: 59

## 2021-06-05 ENCOUNTER — Other Ambulatory Visit: Payer: Self-pay

## 2021-06-05 DIAGNOSIS — R2689 Other abnormalities of gait and mobility: Secondary | ICD-10-CM

## 2021-06-05 DIAGNOSIS — M6281 Muscle weakness (generalized): Secondary | ICD-10-CM

## 2021-06-05 DIAGNOSIS — R2681 Unsteadiness on feet: Secondary | ICD-10-CM

## 2021-06-05 NOTE — Telephone Encounter (Signed)
Called and spoke with patient and gave her the main number to make an appointment with someone over at [facility] ?

## 2021-06-05 NOTE — Therapy (Signed)
?OUTPATIENT PHYSICAL THERAPY TREATMENT NOTE ? ? ?Patient Name: Ruth Bell ?MRN: [id number] ?DOB:[birth year], 61 y.o., female ?Today's Date: 06/05/2021 ? ?PCP: Mosie Lukes, MD ?REFERRING PROVIDER: Ellouise Newer ? ? PT End of Session - 06/05/21 1026   ? ? Visit Number 11   ? Number of Visits 16   ? Date for PT Re-Evaluation 07/07/21   ? Authorization Type [employer]   ? PT Start Time 1024   PT running behind  ? PT Stop Time 1100   ? PT Time Calculation (min) 36 min   ? Equipment Utilized During Treatment Gait belt   ? Activity Tolerance Patient tolerated treatment well   ? Behavior During Therapy [facility] for tasks assessed/performed   ? ?  ?  ? ?  ? ? ?Past Medical History:  ?Diagnosis Date  ? Anemia   ? Anxiety   ? Arm skin lesion, left 09/05/2014  ? Constipation 12/27/2013  ? Depression   ? Dysphagia 04/15/2016  ? Eczema 08/22/2015  ? GERD (gastroesophageal reflux disease)   ? Hematuria 03/04/2016  ? History of shingles 01/22/2016  ? Hyperglycemia 01/15/2015  ? Hyperlipidemia, mixed 08/22/2015  ? Hypertension   ? Menometrorrhagia 2006  ? Migraine, unspecified, without mention of intractable migraine without mention of status migrainosus   ? Preventative health care 11/04/2016  ? Pseudoseizures ([place])   ? Seizures ([place])   ? history of pseudoseizure disorder, last last seizure 3 wks, keppra inc in dosage  ? Tubular adenoma of colon 05/2011  ? Uterine leiomyoma 2006  ? ?Past Surgical History:  ?Procedure Laterality Date  ? ABDOMINAL HYSTERECTOMY  04/06/04  ? APPENDECTOMY    ? BILATERAL SALPINGOOPHORECTOMY  04/06/04  ? CARDIAC EVENT MONITOR  02/2016  ? Mostly sinus bradycardia and sinus rhythm.  Rare PVCs and PACs.  No arrhythmias.  Symptoms of chest pain and fluttering as well as "passed out spell" noted with normal sinus rhythm.  ? MASS EXCISION Left 06/02/2015  ? Procedure: EXCISION LEFT ARM MASS;  Surgeon: Autumn Messing III, MD;  Location: [place];  Service: General;  Laterality: Left;  ? NM MYOVIEW LTD   04/2016  ? Reached heart rate of 127 bpm with Lexiscan.  EF 60 to 65%.  LOW RISK.  No ischemia or infarction.  ? ROTATOR CUFF REPAIR    ? TRANSTHORACIC ECHOCARDIOGRAM  01/2016  ? F 55-6%.  No R WMA.  GR 1 DD.  Normal valves.  ? ?Patient Active Problem List  ? Diagnosis Date Noted  ? Left-sided weakness 01/31/2021  ? Gout 01/31/2021  ? Urinary frequency 05/07/2017  ? Abdominal bloating 05/07/2017  ? Back pain 03/21/2017  ? Tachycardia 02/28/2017  ? Preventative health care 11/04/2016  ? Hoarseness 04/15/2016  ? Dysphagia 04/15/2016  ? Atypical chest pain 04/13/2016  ? Dysuria 03/04/2016  ? Hematuria 03/04/2016  ? Palpitations 01/30/2016  ? History of shingles 01/22/2016  ? Eczema 08/22/2015  ? Hyperlipidemia, mixed 08/22/2015  ? Hyperglycemia 01/15/2015  ? Lumbar radiculopathy 11/19/2014  ? GERD (gastroesophageal reflux disease) 09/05/2014  ? Noncompliance with medications 08/11/2014  ? Constipation 12/27/2013  ? Pain in joint, shoulder region 10/13/2013  ? Shoulder pain 09/23/2013  ? Numbness and tingling in right hand 09/23/2013  ? Jaw pain 03/26/2013  ? Neck pain 05/17/2012  ? Seizure disorder ([place]) 02/21/2012  ? Non compliance w medication regimen 02/21/2012  ? Essential hypertension 09/06/2011  ? Abdominal pain 06/20/2011  ? Hemorrhoids 06/20/2011  ? Hx of adenomatous  colonic polyps 06/13/2011  ? Tubular adenoma of colon 05/2011  ? Musculoskeletal chest pain 04/23/2011  ? Radiculopathy of leg 02/02/2011  ? History of pseudoseizure 07/10/2010  ? Anxiety and depression 12/29/2009  ? TINEA PEDIS 06/02/2009  ? Exertional dyspnea 01/13/2008  ? MICROSCOPIC HEMATURIA 06/24/2007  ? WEIGHT LOSS 04/28/2007  ? Anemia 03/10/2007  ? Migraine 03/10/2007  ? ARTHRITIS 03/10/2007  ? SYNCOPE 03/10/2007  ? Menometrorrhagia 2006  ? ? ?REFERRING DIAG: left sided weakness, psychogenic nonepileptic seizure ? ?THERAPY DIAG:  ?Other abnormalities of gait and mobility ? ?Muscle weakness (generalized) ? ?Unsteadiness on feet ? ?PERTINENT  HISTORY: anemia, anxiety, depression, HTN, HLD, migraine- stress triggered ? ? ?PRECAUTIONS: fall ? ?SUBJECTIVE: Pt reports that she tried to call the cardiologist and Dr. Delice Lesch Friday but couldn't get anyone. She will try again today. Feeling good today. Came in without cane today. ? ?PAIN:  ?Are you having pain? Yes ?NPRS scale: 2/10 ?Pain location: left low back ?Pain orientation: Left and Lower  ?PAIN TYPE: chronic ?Pain description: nagging  ?Aggravating factors: up a lot, at night ?Relieving factors: rest, heat ? ? ? ?TODAY'S TREATMENT:  ? 06/05/21: ?  ? [facility] PT Assessment - 06/05/21 1031   ? ?  ? Standardized Balance Assessment  ? Standardized Balance Assessment Timed Up and Go Test   ?  ? Timed Up and Go Test  ? TUG Normal TUG   ? Normal TUG (seconds) 10.4   10.7 and 10.09  ? ?  ?  ? ?  ? ? ? NuStep level 7 BUE and BLE x 8 min for strengthening and improving activity tolerance. HR=102 after. Pt reports that leg felt good. ? Pt ambulated 83' without AD with verbal cues to increase left heel strike and stance time with relaxing arms supervision. ?In // bars: step-ups on 4" step with LLE x 10 then tapping cone with RLE to further increase left stance time with step-up x 10. Standing on  rockerboard positioned lateral: maintaining level x 30 sec eyes open, rocking side to side x 10 with cues to shift hips CGA, eyes closed x 30 sec with CGA with increased sway. ?  ?  ? ? ? 06/01/21: ? HR=80.  ?NuStep x 6 min level 6 with BUE and BLE keeping steps/minute >50. ?Ambulated 84' without AD after working on arm swing and step length supervision. Pt with increased stance time on LLE today. ?At counter: reciprocal stepping over 4 cones tapping cone first to increase SLS time x 6 bouts, side stepping over cones x 6 bouts, walking over blue mat marching 6' x 6, tandem gait over blue mat 6' x 6. Close supervision with activities and cues to engage core but pt only had a couple staggers and able to correct on own for most  part. ?SLS activities with soccerball: maintaining with 1 foot on ball x 20 sec each position, then moving ball forwards and back and side to side x 10 each, then alternating taps 10 x 2 all without UE support supervision. ? ? ? ? ?HOME EXERCISE PROGRAM: ?[facility] ? ? PT Short Term Goals - 05/10/21 1049   ? ?  ? PT SHORT TERM GOAL #1  ? Title Pt will be independent with progressive HEP for strengthening and balance to continue gains on own.   ? Time 4  ?Pt reports that exercises are going well. Denies any questions.  ? Period Weeks   ? Status Achieved  ? Target Date 06/07/21   ?  ?  PT SHORT TERM GOAL #2  ? Title Pt will decrease TUG from 17.98 sec to <15 sec for improved balance and functional mobility.   ? Baseline 05/10/21 17.98 sec without AD , 06/05/21 10.4 sec  ? Time 4   ? Period Weeks   ? Status Achieved  ? Target Date 06/07/21   ?  ? PT SHORT TERM GOAL #3  ? Title Pt will be able to get in/out of car lifting left leg on own for improved mobility.   ? Time 4  ?06/05/21 Pt reports this is improving. Able to get in and out without helping with hands  ? Period Weeks   ? Status Achieved  ? Target Date 06/07/21   ? ?  ?  ? ?  ? ? ? PT Long Term Goals - 05/10/21 1050   ? ?  ? PT LONG TERM GOAL #1  ? Title Pt will ambulate >1000' on varied surfaces without AD independently for improved community ambulation. (LTGs due 07/07/21)   ? Baseline 05/10/21 850' supervision with cane on varied surfaces outside.   ? Time 8   ? Period Weeks   ? Status New   ? Target Date 07/07/21   ?  ? PT LONG TERM GOAL #2  ? Title Pt will decrease 5 x sit to stand from 30.47 sec to <24 sec for improved balance and functional strength.   ? Baseline 04/19/21 27 sec without hands from mat table. 05/10/21 27.11 sec   ? Time 8   ? Period Weeks   ? Status On-going   ? Target Date 07/07/21   ?  ? PT LONG TERM GOAL #3  ? Title Pt will increase gait speed from 0.66ms to >0.881m for improved community mobility.   ? Baseline 03/29/21 0.5579m 05/10/21 0.48m31mcomfortable and 0.61m/29mst   ? Time 8   ? Period Weeks   ? Status Revised   ? Target Date 07/07/21   ?  ? PT LONG TERM GOAL #4  ? Title Pt will ambulate up/down 4 steps with cane with reciprocal pattern f

## 2021-06-06 ENCOUNTER — Ambulatory Visit: Payer: 59 | Admitting: Occupational Therapy

## 2021-06-06 DIAGNOSIS — M25512 Pain in left shoulder: Secondary | ICD-10-CM

## 2021-06-06 DIAGNOSIS — R2681 Unsteadiness on feet: Secondary | ICD-10-CM

## 2021-06-06 DIAGNOSIS — M6281 Muscle weakness (generalized): Secondary | ICD-10-CM

## 2021-06-06 DIAGNOSIS — R278 Other lack of coordination: Secondary | ICD-10-CM

## 2021-06-06 DIAGNOSIS — M79602 Pain in left arm: Secondary | ICD-10-CM

## 2021-06-06 DIAGNOSIS — R2689 Other abnormalities of gait and mobility: Secondary | ICD-10-CM | POA: Diagnosis not present

## 2021-06-06 NOTE — Therapy (Signed)
[facility] ?[place] ?[place], [place], [address] ?Phone: [phone]   Fax:  [phone] ? ?Occupational Therapy Treatment ? ?Patient Details  ?Name: Ruth Bell ?MRN: [id number] ?Date of Birth: 06-24-1960 ?Referring Provider (OT): Dr Delice Lesch ? ? ?Encounter Date: 06/06/2021 ? ?OCCUPATIONAL THERAPY DISCHARGE SUMMARY ? ? ?Current functional level related to goals / functional outcomes: ?Pt has made excellent overall progress. She no longer has UE pain. Pt has achieved all goals. ?  ?Remaining deficits: ?Decreased balance, decreased functional mobility ?  ?Education / Equipment: ?Pt was educated regarding HEP. She demonstrates understanding.  ? ?Patient agrees to discharge. Patient goals were met. Patient is being discharged due to meeting the stated rehab goals.. ? ?  ? ? OT End of Session - 06/06/21 1040   ? ? Visit Number 9   ? Number of Visits 13   ? Date for OT Re-Evaluation 07/06/21   ? Authorization Type [employer]   ? Authorization Time Period 12 weeks   ? Authorization - Visit Number 9   ? Authorization - Number of Visits 30   ? OT Start Time 1020   ? OT Stop Time 1100   ? OT Time Calculation (min) 40 min   ? ?  ?  ? ?  ? ? ?Past Medical History:  ?Diagnosis Date  ? Anemia   ? Anxiety   ? Arm skin lesion, left 09/05/2014  ? Constipation 12/27/2013  ? Depression   ? Dysphagia 04/15/2016  ? Eczema 08/22/2015  ? GERD (gastroesophageal reflux disease)   ? Hematuria 03/04/2016  ? History of shingles 01/22/2016  ? Hyperglycemia 01/15/2015  ? Hyperlipidemia, mixed 08/22/2015  ? Hypertension   ? Menometrorrhagia 2006  ? Migraine, unspecified, without mention of intractable migraine without mention of status migrainosus   ? Preventative health care 11/04/2016  ? Pseudoseizures ([place])   ? Seizures ([place])   ? history of pseudoseizure disorder, last last seizure 3 wks, keppra inc in dosage  ? Tubular adenoma of colon 05/2011  ? Uterine leiomyoma 2006   ? ? ?Past Surgical History:  ?Procedure Laterality Date  ? ABDOMINAL HYSTERECTOMY  04/06/04  ? APPENDECTOMY    ? BILATERAL SALPINGOOPHORECTOMY  04/06/04  ? CARDIAC EVENT MONITOR  02/2016  ? Mostly sinus bradycardia and sinus rhythm.  Rare PVCs and PACs.  No arrhythmias.  Symptoms of chest pain and fluttering as well as "passed out spell" noted with normal sinus rhythm.  ? MASS EXCISION Left 06/02/2015  ? Procedure: EXCISION LEFT ARM MASS;  Surgeon: Autumn Messing III, MD;  Location: [place];  Service: General;  Laterality: Left;  ? NM MYOVIEW LTD  04/2016  ? Reached heart rate of 127 bpm with Lexiscan.  EF 60 to 65%.  LOW RISK.  No ischemia or infarction.  ? ROTATOR CUFF REPAIR    ? TRANSTHORACIC ECHOCARDIOGRAM  01/2016  ? F 55-6%.  No R WMA.  GR 1 DD.  Normal valves.  ? ? ?There were no vitals filed for this visit. ? ? Subjective Assessment - 06/06/21 1045   ? ? Subjective  Pt states she plans to make a cake today   ? Pertinent History Pt was referred for left sided weakness and psychogenic nonepileptic seizure. She reports she had a stroke on 11/6 but imaging was all negative and had left sided weakness throughout including face. She reports still having some numbness. Pt reports that this is different than the seizure like  activity she has had in the past. She reports 1 fall when this occurred as left leg gave out on her. She denies any vision changes.       Patient is accompained by: Family member   husband    Pertinent History anemia, anxiety, depression, HTN, HLD, migraine- stress triggered . hx of rotator cuff tear bilaterally, pt reports hx of LUE fx 1 year ago   ? Limitations seizures in reponse to pain   ? Patient Stated Goals Get my arm back and to be able to do things on my own and not be in so much pain and be able   ? Currently in Pain? No/denies   ? ?  ?  ? ?  ? ? ? ? ? ? ? ?Treatment: Arm bike x 6 mins , level 3 for conditioning ?Purdue pegboard for increased LUE fine motor coordination,  and in hand manipulation, min v.c to avoid compensation. ?Gripper set at level 3 for sustained grip to pick up 1 inch blocks ?O'connor pegs placing with fingertips then removing  pegs with tweezers for sustained pinch ? ? ? ? ? ? ? ? ? ? ? ? ? ? ? OT Education - 06/06/21 1038   ? ? Education Details upgraded HEP to red theraband HEP, min v.c initally then pt returned demonstration   ? Person(s) Educated Patient   ? Methods Explanation;Demonstration;Verbal cues;Handout   ? Comprehension Verbalized understanding;Returned demonstration;Verbal cues required   ? ?  ?  ? ?  ? ? ? OT Short Term Goals - 05/16/21 1051   ? ?  ? OT SHORT TERM GOAL #1  ? Title I with inital HEP   ? Time 6   ? Period Weeks   ? Status Achieved   ? Target Date 05/25/21   ?  ? OT SHORT TERM GOAL #2  ? Title Pt will use LUE as a non dominant assist at least 25% of the time for ADLs with pain no greater than 6/10   ? Time 6   ? Period Weeks   ? Status Achieved   met uses 25% pain 4/10  ?  ? OT SHORT TERM GOAL #3  ? Title Pt will demonstrate 65 shoulder flexion in prep for functional reach   ? Baseline 50*   ? Time 6   ? Period Weeks   ? Status Achieved   ?  ? OT SHORT TERM GOAL #4  ? Title Pt will demonstrate improved fine motor coordination as evidenced by decreasing 9 hole peg test score to 42 secs or less   ? Baseline RUE 19.45, LUE 45 secs   ? Time 6   ? Period Weeks   ? Status Achieved   20.94  ? ?  ?  ? ?  ? ? ? ? OT Long Term Goals - 06/06/21 1019   ? ?  ? OT LONG TERM GOAL #1  ? Title I with updated HEP   ? Status Achieved   ?  ? OT LONG TERM GOAL #2  ? Title Pt will use LUE as a non dominant assist at least 50% of the time for ADLS with pain less than or equal to 4/10   ? Status Achieved   ?  ? OT LONG TERM GOAL #3  ? Title Pt. will demonstrate 75* shoulder flexion for LUE in prep for functional use.   ? Status Achieved   ?  ? OT LONG TERM GOAL #4  ?  Title Pt will increase LUE grip strength by 5 lbs for increased LUE functional use during  ADLS   ? Status Achieved   ?  ? OT LONG TERM GOAL #5  ? Title Pt. will demonstrate LUE elbow flexion WFLS for ADLS/IADLs.   ? Status Achieved   ? ?  ?  ? ?  ? ? ? ? ? ? ? ? Plan - 06/06/21 1037   ? ? Clinical Impression Statement Pt demonstrates excellent overall progress towards goals. she demonstrates understanding of updated HEP. Pt agrees with plans for d/c.   ? OT Occupational Profile and History Detailed Assessment- Review of Records and additional review of physical, cognitive, psychosocial history related to current functional performance   ? Occupational performance deficits (Please refer to evaluation for details): ADL's;IADL's;Rest and Sleep;Leisure;Social Participation;Work   ? Body Structure / Function / Physical Skills ADL;UE functional use;Balance;Flexibility;Pain;FMC;Gait;ROM;GMC;Coordination;Sensation;Decreased knowledge of precautions;Decreased knowledge of use of DME;IADL;Dexterity;Mobility;Strength   ? Rehab Potential Fair   ? Clinical Decision Making Several treatment options, min-mod task modification necessary   ? Comorbidities Affecting Occupational Performance: May have comorbidities impacting occupational performance   ? Modification or Assistance to Complete Evaluation  Min-Moderate modification of tasks or assist with assess necessary to complete eval   ? OT Frequency 1x / week   ? OT Duration 12 weeks   ? OT Treatment/Interventions Self-care/ADL training;Visual/perceptual remediation/compensation;Ultrasound;Patient/family education;DME and/or AE instruction;Paraffin;Passive range of motion;Balance training;Fluidtherapy;Cryotherapy;Functional Mobility Training;Splinting;Therapeutic activities;Manual Therapy;Therapeutic exercise;Moist Heat;Neuromuscular education   ? Plan d/c OT   ? Consulted and Agree with Plan of Care Patient   ? ?  ?  ? ?  ? ? ?Patient will benefit from skilled therapeutic intervention in order to improve the following deficits and impairments:   ?Body Structure /  Function / Physical Skills: ADL, UE functional use, Balance, Flexibility, Pain, FMC, Gait, ROM, GMC, Coordination, Sensation, Decreased knowledge of precautions, Decreased knowledge of use of DME, IADL, Dexterity, Mob

## 2021-06-07 ENCOUNTER — Telehealth: Payer: Self-pay | Admitting: Cardiology

## 2021-06-07 NOTE — Telephone Encounter (Signed)
Patient was told Dr. Bettina Gavia was going to refer her to another Cardiologist as his schedule was full into September. The patient wanted to come see Dr. Harriet Masson at the [place] office because they already live in [place]. Informed the patient that no referral was needed but we did have to do a provider switch request to transfer care. ? ?Dr. Bettina Gavia, is it OK with you if she switches to Dr. Harriet Masson?  ? ?Dr. Harriet Masson, if Dr. Bettina Gavia is OK with the switch can she see you? ? ?Please let me know and I can inform the patient  ?

## 2021-06-13 ENCOUNTER — Encounter: Payer: 59 | Admitting: Occupational Therapy

## 2021-06-14 ENCOUNTER — Ambulatory Visit: Payer: 59

## 2021-06-14 ENCOUNTER — Other Ambulatory Visit: Payer: Self-pay

## 2021-06-14 ENCOUNTER — Ambulatory Visit: Payer: 59 | Admitting: Cardiology

## 2021-06-14 ENCOUNTER — Encounter: Payer: Self-pay | Admitting: Cardiology

## 2021-06-14 VITALS — BP 142/80 | HR 58

## 2021-06-14 VITALS — BP 130/80 | HR 67 | Ht 62.0 in | Wt 113.6 lb

## 2021-06-14 DIAGNOSIS — R2689 Other abnormalities of gait and mobility: Secondary | ICD-10-CM

## 2021-06-14 DIAGNOSIS — R400 Somnolence: Secondary | ICD-10-CM | POA: Diagnosis not present

## 2021-06-14 DIAGNOSIS — E1100 Type 2 diabetes mellitus with hyperosmolarity without nonketotic hyperglycemic-hyperosmolar coma (NKHHC): Secondary | ICD-10-CM

## 2021-06-14 DIAGNOSIS — Z79899 Other long term (current) drug therapy: Secondary | ICD-10-CM | POA: Diagnosis not present

## 2021-06-14 DIAGNOSIS — R0683 Snoring: Secondary | ICD-10-CM

## 2021-06-14 DIAGNOSIS — R002 Palpitations: Secondary | ICD-10-CM | POA: Diagnosis not present

## 2021-06-14 DIAGNOSIS — M6281 Muscle weakness (generalized): Secondary | ICD-10-CM

## 2021-06-14 DIAGNOSIS — R5383 Other fatigue: Secondary | ICD-10-CM

## 2021-06-14 DIAGNOSIS — R2681 Unsteadiness on feet: Secondary | ICD-10-CM

## 2021-06-14 DIAGNOSIS — E782 Mixed hyperlipidemia: Secondary | ICD-10-CM

## 2021-06-14 MED ORDER — METOPROLOL SUCCINATE ER 50 MG PO TB24: 75.0000 mg | ORAL_TABLET | Freq: Two times a day (BID) | ORAL | 3 refills | Status: DC

## 2021-06-14 NOTE — Patient Instructions (Addendum)
Medication Instructions:  ?Your physician has recommended you make the following change in your medication:  ?STOP: Metoprolol Tartrate 75 mg  ?START: Metoprolol succinate (Toprol-XL) 75 mg twice daily  ?*If you need a refill on your cardiac medications before your next appointment, please call your pharmacy* ? ? ?Lab Work: ?Your physician recommends that you return for lab work in:  ?TODAY: BMET, Mag, CBC, TSH+T3+T4, Lipids, HgbA1c, Vitamin D ?If you have labs (blood work) drawn today and your tests are completely normal, you will receive your results only by: ?MyChart Message (if you have MyChart) OR ?A paper copy in the mail ?If you have any lab test that is abnormal or we need to change your treatment, we will call you to review the results. ? ? ?Testing/Procedures: ?Your physician has recommended that you have a sleep study. This test records several body functions during sleep, including: brain activity, eye movement, oxygen and carbon dioxide blood levels, heart rate and rhythm, breathing rate and rhythm, the flow of air through your mouth and nose, snoring, body muscle movements, and chest and belly movement. ? ?Follow-Up: ?At [facility], you and your health needs are our priority.  As part of our continuing mission to provide you with exceptional heart care, we have created designated Provider Care Teams.  These Care Teams include your primary Cardiologist (physician) and Advanced Practice Providers (APPs -  Physician Assistants and Nurse Practitioners) who all work together to provide you with the care you need, when you need it. ? ?We recommend signing up for the patient portal called "MyChart".  Sign up information is provided on this After Visit Summary.  MyChart is used to connect with patients for Virtual Visits (Telemedicine).  Patients are able to view lab/test results, encounter notes, upcoming appointments, etc.  Non-urgent messages can be sent to your provider as well.   ?To learn more about  what you can do with MyChart, go to [email or web address].   ? ?Your next appointment:   ?12 week(s) ? ?The format for your next appointment:   ?In Person ? ?Provider:   ?Berniece Salines, DO   ? ? ?Other Instructions ? [email or web address] [email or web address] ? ? ? ? ? ? ? ?FDA-cleared, clinical grade mobile EKG monitor: Jodelle Red is the most clinically-validated mobile EKG used by the world's leading cardiac care medical professionals With Basic service, know instantly if your heart rhythm is normal or if atrial fibrillation is detected, and email the last single EKG recording to yourself or your doctor Premium service, available for purchase through the K[employer]ia app for $9.99 per month or $99 per year, includes unlimited history and storage of your EKG recordings, a monthly EKG summary report to share with your doctor, along with the ability to track your blood pressure, activity and weight Includes one KardiaMobile phone clip FREE SHIPPING: Standard delivery 1-3 business days. Orders placed by 11:00am PST will ship that afternoon. Otherwise, will ship next business day. All orders ship via [employer] from [place], [place] ? ?  ?[employer] - sending an EKG ?Download app and set up profile. ?Run EKG - by placing 1-2 fingers on the silver plates ?After EKG is complete - Download PDF  ?- Skip password (if you apply a password the provider will need it to view the EKG) ?Click share button (square with upward arrow) in bottom left corner ?To send: choose MyChart (first time log into MyChart)  ?Pop up window about sending ECG ?Click continue ?Choose type of message ?Choose provider ?Type  subject and message ?Click send (EKG should be attached)  ?- To send additional EKGs in one message click the paperclip image and bottom of page to attach.   ? ?

## 2021-06-14 NOTE — Patient Instructions (Signed)
Access Code: [email or web address] ?URL: [email or web address] ?Date: 06/14/2021 ?Prepared by: [name] ? ?Exercises ?- Standing March with Counter Support  - 2 x daily - 5 x weekly - 1 sets - 10 reps ?- Side Stepping with Counter Support  - 2 x daily - 5 x weekly - 1 sets - 2-3 reps ?- Forward/backwards walk at counter  - 2 x daily - 5 x weekly - 1 sets - 2-3 reps ?- Supine Lower Trunk Rotation  - 1 x daily - 7 x weekly - 2 sets - 5 reps ?

## 2021-06-14 NOTE — Therapy (Signed)
?OUTPATIENT PHYSICAL THERAPY TREATMENT NOTE ? ? ?Patient Name: Ruth Bell ?MRN: [id number] ?DOB:1960-09-04, 61 y.o., female ?Today's Date: 06/14/2021 ? ?PCP: Mosie Lukes, MD ?REFERRING PROVIDER: Ellouise Newer ? ? PT End of Session - 06/14/21 1018   ? ? Visit Number 12   ? Number of Visits 16   ? Date for PT Re-Evaluation 07/07/21   ? Authorization Type [employer]   ? PT Start Time 1015   ? PT Stop Time 1058   ? PT Time Calculation (min) 43 min   ? Equipment Utilized During Treatment Gait belt   ? Activity Tolerance Patient tolerated treatment well   ? Behavior During Therapy [facility] for tasks assessed/performed   ? ?  ?  ? ?  ? ? ?Past Medical History:  ?Diagnosis Date  ? Anemia   ? Anxiety   ? Arm skin lesion, left 09/05/2014  ? Constipation 12/27/2013  ? Depression   ? Dysphagia 04/15/2016  ? Eczema 08/22/2015  ? GERD (gastroesophageal reflux disease)   ? Hematuria 03/04/2016  ? History of shingles 01/22/2016  ? Hyperglycemia 01/15/2015  ? Hyperlipidemia, mixed 08/22/2015  ? Hypertension   ? Menometrorrhagia 2006  ? Migraine, unspecified, without mention of intractable migraine without mention of status migrainosus   ? Preventative health care 11/04/2016  ? Pseudoseizures ([place])   ? Seizures ([place])   ? history of pseudoseizure disorder, last last seizure 3 wks, keppra inc in dosage  ? Tubular adenoma of colon 05/2011  ? Uterine leiomyoma 2006  ? ?Past Surgical History:  ?Procedure Laterality Date  ? ABDOMINAL HYSTERECTOMY  04/06/04  ? APPENDECTOMY    ? BILATERAL SALPINGOOPHORECTOMY  04/06/04  ? CARDIAC EVENT MONITOR  02/2016  ? Mostly sinus bradycardia and sinus rhythm.  Rare PVCs and PACs.  No arrhythmias.  Symptoms of chest pain and fluttering as well as "passed out spell" noted with normal sinus rhythm.  ? MASS EXCISION Left 06/02/2015  ? Procedure: EXCISION LEFT ARM MASS;  Surgeon: Autumn Messing III, MD;  Location: [place];  Service: General;  Laterality: Left;  ? NM MYOVIEW LTD  04/2016  ? Reached  heart rate of 127 bpm with Lexiscan.  EF 60 to 65%.  LOW RISK.  No ischemia or infarction.  ? ROTATOR CUFF REPAIR    ? TRANSTHORACIC ECHOCARDIOGRAM  01/2016  ? F 55-6%.  No R WMA.  GR 1 DD.  Normal valves.  ? ?Patient Active Problem List  ? Diagnosis Date Noted  ? Left-sided weakness 01/31/2021  ? Gout 01/31/2021  ? Urinary frequency 05/07/2017  ? Abdominal bloating 05/07/2017  ? Back pain 03/21/2017  ? Tachycardia 02/28/2017  ? Preventative health care 11/04/2016  ? Hoarseness 04/15/2016  ? Dysphagia 04/15/2016  ? Atypical chest pain 04/13/2016  ? Dysuria 03/04/2016  ? Hematuria 03/04/2016  ? Palpitations 01/30/2016  ? History of shingles 01/22/2016  ? Eczema 08/22/2015  ? Hyperlipidemia, mixed 08/22/2015  ? Hyperglycemia 01/15/2015  ? Lumbar radiculopathy 11/19/2014  ? GERD (gastroesophageal reflux disease) 09/05/2014  ? Noncompliance with medications 08/11/2014  ? Constipation 12/27/2013  ? Pain in joint, shoulder region 10/13/2013  ? Shoulder pain 09/23/2013  ? Numbness and tingling in right hand 09/23/2013  ? Jaw pain 03/26/2013  ? Neck pain 05/17/2012  ? Seizure disorder ([place]) 02/21/2012  ? Non compliance w medication regimen 02/21/2012  ? Essential hypertension 09/06/2011  ? Abdominal pain 06/20/2011  ? Hemorrhoids 06/20/2011  ? Hx of adenomatous colonic polyps 06/13/2011  ?  Tubular adenoma of colon 05/2011  ? Musculoskeletal chest pain 04/23/2011  ? Radiculopathy of leg 02/02/2011  ? History of pseudoseizure 07/10/2010  ? Anxiety and depression 12/29/2009  ? TINEA PEDIS 06/02/2009  ? Exertional dyspnea 01/13/2008  ? MICROSCOPIC HEMATURIA 06/24/2007  ? WEIGHT LOSS 04/28/2007  ? Anemia 03/10/2007  ? Migraine 03/10/2007  ? ARTHRITIS 03/10/2007  ? SYNCOPE 03/10/2007  ? Menometrorrhagia 2006  ? ? ?REFERRING DIAG: left sided weakness, psychogenic nonepileptic seizure ? ?THERAPY DIAG:  ?Other abnormalities of gait and mobility ? ?Muscle weakness (generalized) ? ?Unsteadiness on feet ? ?PERTINENT HISTORY: anemia,  anxiety, depression, HTN, HLD, migraine- stress triggered ? ? ?PRECAUTIONS: fall ? ?SUBJECTIVE: Pt reports she saw the cardiologist and found out some things. She just found out that she was diabetic. They also changed her metoprolol from tartrate to succinate. They also want to have an in home heart monitoring system and have a sleep study done. They did a lot of blood work today.  ? ?PAIN:  ?Are you having pain? no ?NPRS scale: 0/10 ?Pain location: left low back earlier this morning but has resided now.  ?Pain orientation: Left and Lower  ?PAIN TYPE: chronic ?Pain description: nagging  ?Aggravating factors: up a lot, at night ?Relieving factors: rest, heat ? ? ? ?TODAY'S TREATMENT:  ? ? 06/14/21: ?  ? Reciprocal stepping over 5 hurdles of varied height x 6 bouts with verbal cues to go up and over and not circumduct. Noted some left foot inversion at times. Repeated with side stepping with better foot alignment x 6 bouts. ?Side stepping along counter with RTB 8' x 6. Pt reported feeling in gluts. Monster walk with RTB 115' to try to get more glut med activation. ? Pt ambulated 46' without AD at end of session working on increasing left stance and less lateral sway supervision without AD. Pt able to demonstrate improved left stance at end of session compared to beginning. ?   ?  ? ? ?  ? ? ? ?HOME EXERCISE PROGRAM: ?[place] ?  Added RTB resistance for side stepping and marching ? ? PT Short Term Goals - 05/10/21 1049   ? ?  ? PT SHORT TERM GOAL #1  ? Title Pt will be independent with progressive HEP for strengthening and balance to continue gains on own.   ? Time 4  ?Pt reports that exercises are going well. Denies any questions.  ? Period Weeks   ? Status Achieved  ? Target Date 06/07/21   ?  ? PT SHORT TERM GOAL #2  ? Title Pt will decrease TUG from 17.98 sec to <15 sec for improved balance and functional mobility.   ? Baseline 05/10/21 17.98 sec without AD , 06/05/21 10.4 sec  ? Time 4   ? Period Weeks   ? Status  Achieved  ? Target Date 06/07/21   ?  ? PT SHORT TERM GOAL #3  ? Title Pt will be able to get in/out of car lifting left leg on own for improved mobility.   ? Time 4  ?06/05/21 Pt reports this is improving. Able to get in and out without helping with hands  ? Period Weeks   ? Status Achieved  ? Target Date 06/07/21   ? ?  ?  ? ?  ? ? ? PT Long Term Goals - 05/10/21 1050   ? ?  ? PT LONG TERM GOAL #1  ? Title Pt will ambulate >1000' on varied surfaces without AD independently  for improved community ambulation. (LTGs due 07/07/21)   ? Baseline 05/10/21 850' supervision with cane on varied surfaces outside.   ? Time 8   ? Period Weeks   ? Status New   ? Target Date 07/07/21   ?  ? PT LONG TERM GOAL #2  ? Title Pt will decrease 5 x sit to stand from 30.47 sec to <24 sec for improved balance and functional strength.   ? Baseline 04/19/21 27 sec without hands from mat table. 05/10/21 27.11 sec   ? Time 8   ? Period Weeks   ? Status On-going   ? Target Date 07/07/21   ?  ? PT LONG TERM GOAL #3  ? Title Pt will increase gait speed from 0.33ms to >0.841m for improved community mobility.   ? Baseline 03/29/21 0.5526m 05/10/21 0.5m62momfortable and 0.70m/76mst   ? Time 8   ? Period Weeks   ? Status Revised   ? Target Date 07/07/21   ?  ? PT LONG TERM GOAL #4  ? Title Pt will ambulate up/down 4 steps with cane with reciprocal pattern for improved functional strength and home access.   ? Time 8   ? Period Weeks   ? Status New   ? Target Date 07/07/21   ? ?  ?  ? ?  ? ? ? Plan - 05/19/21 1032   ? ? Clinical Impression Statement Pt was able to improve left stance time with gait throughout session. Does better with increasing stance time during functional activities. Added in red theraband resistance to side stepping and marching on HEP  ? Personal Factors and Comorbidities Comorbidity 3+;Behavior Pattern   ? Comorbidities anemia, anxiety, depression, HTN, HLD, migraine,   ? Examination-Activity Limitations Stairs;Locomotion  Level;Transfers   ? Examination-Participation Restrictions Community Activity;Occupation;Meal Prep;Cleaning;Laundry   ? Stability/Clinical Decision Making Evolving/Moderate complexity   ? Rehab Potential Fair   ? P

## 2021-06-14 NOTE — Progress Notes (Signed)
?Cardiology Office Note:   ? ?Date:  06/14/2021  ? ?ID:  Ruth Bell, DOB September 27, 1960, MRN [id number] ? ?PCP:  Mosie Lukes, MD  ?Cardiologist:  Berniece Salines, DO  ?Electrophysiologist:  None  ? ?Referring MD: Mosie Lukes, MD  ? ?" I am still having palpitations" ? ?History of Present Illness:   ? ?Ruth Bell is a 61 y.o. female with a hx of hyperlipidemia, diabetes mellitus hemoglobin A1c 6.5, hypertension and GERD here today for follow-up visit. ? ?Chart review the patient was seen in 2018 cardiology at that time she had work-up with echocardiogram as well as nuclear stress test which were all within normal limits. ? ?Recently in January 2023 she did see Dr. Bettina Gavia for palpitations during that time they did a ZIO monitor which was not impressive for any arrhythmia.  She was started on Lopressor 75 mg twice daily for the palpitations. ? ?In the interim the patient requested to transition her care to our [facility] office she is here today for follow-up visit.  This is my first interaction with the patient.  She is here with her husband. ? ?She tells me that she has been experiencing intermittent palpitations.  Mostly at nighttime.  She has not had any lightheadedness or dizziness. ? ?What she complains about is the fact that she is experiencing significant fatigue daytime somnolence and her husband who is with her in the office today reports that she snores ? ?Past Medical History:  ?Diagnosis Date  ? Anemia   ? Anxiety   ? Arm skin lesion, left 09/05/2014  ? Constipation 12/27/2013  ? Depression   ? Dysphagia 04/15/2016  ? Eczema 08/22/2015  ? GERD (gastroesophageal reflux disease)   ? Hematuria 03/04/2016  ? History of shingles 01/22/2016  ? Hyperglycemia 01/15/2015  ? Hyperlipidemia, mixed 08/22/2015  ? Hypertension   ? Menometrorrhagia 2006  ? Migraine, unspecified, without mention of intractable migraine without mention of status migrainosus   ? Preventative health care 11/04/2016  ? Pseudoseizures ([place])   ?  Seizures ([place])   ? history of pseudoseizure disorder, last last seizure 3 wks, keppra inc in dosage  ? Tubular adenoma of colon 05/2011  ? Uterine leiomyoma 2006  ? ? ?Past Surgical History:  ?Procedure Laterality Date  ? ABDOMINAL HYSTERECTOMY  04/06/04  ? APPENDECTOMY    ? BILATERAL SALPINGOOPHORECTOMY  04/06/04  ? CARDIAC EVENT MONITOR  02/2016  ? Mostly sinus bradycardia and sinus rhythm.  Rare PVCs and PACs.  No arrhythmias.  Symptoms of chest pain and fluttering as well as "passed out spell" noted with normal sinus rhythm.  ? MASS EXCISION Left 06/02/2015  ? Procedure: EXCISION LEFT ARM MASS;  Surgeon: Autumn Messing III, MD;  Location: [place];  Service: General;  Laterality: Left;  ? NM MYOVIEW LTD  04/2016  ? Reached heart rate of 127 bpm with Lexiscan.  EF 60 to 65%.  LOW RISK.  No ischemia or infarction.  ? ROTATOR CUFF REPAIR    ? TRANSTHORACIC ECHOCARDIOGRAM  01/2016  ? F 55-6%.  No R WMA.  GR 1 DD.  Normal valves.  ? ? ?Current Medications: ?Current Meds  ?Medication Sig  ? amLODipine (NORVASC) 5 MG tablet Take 5 mg by mouth daily.  ? aspirin 81 MG chewable tablet Chew 81 mg by mouth daily.  ? atorvastatin (LIPITOR) 10 MG tablet Take 1 tablet (10 mg total) by mouth daily.  ? butalbital-acetaminophen-caffeine (FIORICET) 50-325-40 MG tablet Take 1 tablet  by mouth 2 (two) times daily.  ? esomeprazole (NEXIUM) 40 MG capsule Take 1 capsule (40 mg total) by mouth daily. Use as needed for gerd  ? levETIRAcetam (KEPPRA) 500 MG tablet Take 500 mg by mouth 2 (two) times daily.  ? meloxicam (MOBIC) 15 MG tablet TAKE 1 TABLET (15 MG TOTAL) BY MOUTH DAILY.  ? metoprolol succinate (TOPROL XL) 50 MG 24 hr tablet Take 1.5 tablets (75 mg total) by mouth in the morning and at bedtime. Take with or immediately following a meal.  ? raNITIdine HCl (RANITIDINE 150 MAX STRENGTH PO) Take 150 mg by mouth daily.  ? tiZANidine (ZANAFLEX) 2 MG tablet Take 1 tablet (2 mg total) by mouth every 6 (six) hours as needed.  ?  [DISCONTINUED] Metoprolol Tartrate 75 MG TABS Take 75 mg by mouth 2 (two) times daily.  ?  ? ?Allergies:   Patient has no known allergies.  ? ?Social History  ? ?Socioeconomic History  ? Marital status: Married  ?  Spouse name: Gwyndolyn Saxon  ? Number of children: 2  ? Years of education: Not on file  ? Highest education level: Not on file  ?Occupational History  ? Occupation: CMA-retired  ?Tobacco Use  ? Smoking status: Every Day  ?  Packs/day: 0.50  ?  Years: 40.00  ?  Pack years: 20.00  ?  Types: Cigarettes  ?  Start date: 12/18/2015  ? Smokeless tobacco: Never  ? Tobacco comments:  ?  3-4 cigarettes daily  ?Vaping Use  ? Vaping Use: Never used  ?Substance and Sexual Activity  ? Alcohol use: Yes  ?  Comment: wine occassioanlly   ? Drug use: No  ? Sexual activity: Not on file  ?  Comment: lives with husband, no dietary restrictions.   ?Other Topics Concern  ? Not on file  ?Social History Narrative  ? Lives with her husband and their son.  ? Daughter lives in [place], [place].  ? Right handed  ? ?Social Determinants of Health  ? ?Financial Resource Strain: Not on file  ?Food Insecurity: Not on file  ?Transportation Needs: Not on file  ?Physical Activity: Not on file  ?Stress: Not on file  ?Social Connections: Not on file  ?  ? ?Family History: ?The patient's family history includes Cancer in her brother, father, sister, and sister; Diabetes in her sister; Heart attack in her mother and sister; Heart disease in her mother; Hypertension in her brother, father, and sister. There is no history of Colon cancer, Esophageal cancer, Pancreatic cancer, or Stomach cancer. ? ?ROS:   ?Review of Systems  ?Constitution: Negative for decreased appetite, fever and weight gain.  ?HENT: Negative for congestion, ear discharge, hoarse voice and sore throat.   ?Eyes: Negative for discharge, redness, vision loss in right eye and visual halos.  ?Cardiovascular: Report palpitations.  Negative for chest pain, dyspnea on exertion, leg swelling,  orthopnea. ?Respiratory: Negative for cough, hemoptysis, shortness of breath and snoring.   ?Endocrine: Negative for heat intolerance and polyphagia.  ?Hematologic/Lymphatic: Negative for bleeding problem. Does not bruise/bleed easily.  ?Skin: Negative for flushing, nail changes, rash and suspicious lesions.  ?Musculoskeletal: Negative for arthritis, joint pain, muscle cramps, myalgias, neck pain and stiffness.  ?Gastrointestinal: Negative for abdominal pain, bowel incontinence, diarrhea and excessive appetite.  ?Genitourinary: Negative for decreased libido, genital sores and incomplete emptying.  ?Neurological: Negative for brief paralysis, focal weakness, headaches and loss of balance.  ?Psychiatric/Behavioral: Negative for altered mental status, depression and suicidal ideas.  ?Allergic/Immunologic:  Negative for HIV exposure and persistent infections.  ? ? ?EKGs/Labs/Other Studies Reviewed:   ? ?The following studies were reviewed today: ? ? ?EKG: None today ? ?ZIO monitor annually 2023 ?Patch Wear Time:  7 days and 4 hours (2023-01-03T11:14:17-0500 to 2023-01-10T15:24:59-0500) ?  ?Patient had a min HR of 52 bpm, max HR of 156 bpm, and avg HR of 78 bpm. Predominant underlying rhythm was Sinus Rhythm.  ?  ?1 run of Supraventricular Tachycardia occurred lasting 4 beats with a max rate of 156 bpm (avg 130 bpm). Some episodes of  ?Supraventricular Tachycardia may be possible Atrial Tachycardia with variable block. Isolated SVEs were rare (<1.0%), SVE Couplets were rare (<1.0%), and no SVE Triplets were present.  ?  ?Isolated VEs were rare (<1.0%), VE Couplets were rare (<1.0%), and no  Triplets were present. Ventricular Bigeminy and Trigeminy were present. ?  ?There were a triggered and 2 diary events all sinus rhythm sinus tachycardia and generally associated with either isolated or occasional PVCs. ? ? ?Nuclear stress test February 2018 ?The left ventricular ejection fraction is normal (55-65%). ?Nuclear stress EF:  63%. ?There was no ST segment deviation noted during stress. ?The study is normal. ?This is a low risk study. ? ?Echocardiogram December 2017 ?Study Conclusions  ? ?- Left ventricle: The cavity size was norma

## 2021-06-15 ENCOUNTER — Other Ambulatory Visit: Payer: Self-pay | Admitting: Family Medicine

## 2021-06-15 LAB — SPECIMEN STATUS REPORT

## 2021-06-16 ENCOUNTER — Encounter: Payer: 59 | Attending: Physical Medicine & Rehabilitation | Admitting: Physical Medicine & Rehabilitation

## 2021-06-16 ENCOUNTER — Other Ambulatory Visit: Payer: Self-pay | Admitting: Family Medicine

## 2021-06-16 ENCOUNTER — Telehealth: Payer: Self-pay | Admitting: Family Medicine

## 2021-06-16 ENCOUNTER — Telehealth: Payer: Self-pay | Admitting: *Deleted

## 2021-06-16 ENCOUNTER — Encounter: Payer: Self-pay | Admitting: Physical Medicine & Rehabilitation

## 2021-06-16 VITALS — BP 143/87 | HR 66 | Ht 62.0 in | Wt 118.2 lb

## 2021-06-16 DIAGNOSIS — R1032 Left lower quadrant pain: Secondary | ICD-10-CM | POA: Insufficient documentation

## 2021-06-16 DIAGNOSIS — I10 Essential (primary) hypertension: Secondary | ICD-10-CM

## 2021-06-16 MED ORDER — AMLODIPINE BESYLATE 5 MG PO TABS: 5.0000 mg | ORAL_TABLET | Freq: Every day | ORAL | 1 refills | Status: DC

## 2021-06-16 NOTE — Progress Notes (Signed)
? ?Subjective:  ? ? Patient ID: Ruth Bell, female    DOB: [birth year], 61 y.o.   MRN: [id number] ? ?HPI ?CC:  Left shoulder and low back pain ? ?61 yo with hx of rotator cuff repair several years ago with Dr Marlou Sa, patient does not remember the year, no record in epic.  There is a preoperative right shoulder MRI dated 10/31/2013 showing partial supra spinatus tear of the bursal surface.  An MRI in 2002 of the left shoulder demonstrated supraspinatus and infraspinatus tendinosis, biceps tendon within the groove, glenoid labrum intact.  ?Cervical spine MRI dated 09/25/2013 demonstrated C4-C5 foraminal stenosis on the left side as well as C7-T1 foraminal stenosis on the left side potentially impinging upon the C8 nerve root.  No recent falls or trauma. ?The patient has a history of post Colles' fracture 12/13/2018. ?Also c/o numbness in fingers when doing laundry , dropping objects , onset after  ?Suspected stroke vs pseudoseizure 01/22/21.  The patient complains of both index finger and little finger numbness that is intermittent.   ? ?In 2016 the patient was seen by PCP for severe low back pain and lower extremity weakness.  She was sent to the emergency department where an MRI did not show any sign of significant nerve root compression or spinal cord lesion ? ?More recently, on 01/22/2021 the patient developed some left facial weakness this was noted by husband who took the patient to the emergency department where she was evaluated treated and released.  She did not receive tPA, CT angiogram of the head and neck showed no evidence of large vessel occlusion, ED MD note indicated probable pseudoseizure.  Patient followed up with neurology as an outpatient. ? ?The patient has other pain complaints including her low back as well as left groin. ?The patient has not had any left hip x-rays however she has had MRI of the lumbar spine demonstrating no evidence of spinal stenosis but there is lower lumbar facet arthropathy  noted on the study dated 02/22/2017 ?CLINICAL DATA:  Left shoulder pain. ?  ?EXAM: ?LEFT SHOULDER - 2+ VIEW ?  ?COMPARISON:  Left shoulder x-ray 12/21/2019. ?  ?FINDINGS: ?There is no evidence of fracture or dislocation. There is no ?evidence of arthropathy or other focal bone abnormality. Soft ?tissues are unremarkable. ?  ?IMPRESSION: ?Negative. ?  ?  ?Electronically Signed ?  By: Ronney Asters M.D. ?  On: 02/02/2021 21:08 ? ?CLINICAL DATA:  62 year old female with 3 months of lumbar back pain ?radiating to the left leg despite conservative treatment. Left hip ?pain with flexor weakness. Radiculopathy. Status post steroid ?injection in August. ?  ?EXAM: ?MRI LUMBAR SPINE WITHOUT CONTRAST ?  ?TECHNIQUE: ?Multiplanar, multisequence MR imaging of the lumbar spine was ?performed. No intravenous contrast was administered. ?  ?COMPARISON:  Lumbar radiographs 02/06/2017. CT Abdomen and Pelvis ?01/09/2017. Lumbar MRI 01/22/2015. ?  ?FINDINGS: ?Segmentation: Normal as demonstrated on the CT comparison. Vestigial ?S1-S2 disc space but fully sacralized S1 level. ?  ?Alignment: Subtle retrolisthesis of L5 on S1 is unchanged since ?2016. Normal lumbar lordosis otherwise. ?  ?Vertebrae: No marrow edema or evidence of acute osseous abnormality. ?Visualized bone marrow signal is within normal limits. Intact ?visible sacrum and SI joints. ?  ?Conus medullaris and cauda equina: Conus extends to the L1-L2 level. ?Capacious spinal canal. Conus and cauda equina appear normal. ?  ?Paraspinal and other soft tissues: Chronic 5 cm simple right renal ?cyst. Visualized abdominal viscera and paraspinal soft tissues are ?within normal  limits. ?  ?Disc levels: ?  ?T11-T12:  Negative. ?  ?T12-L1:  Negative. ?  ?L1-L2:  Negative. ?  ?L2-L3:  Negative. ?  ?L3-L4: Normal disc. Borderline to mild facet hypertrophy with a ?small right side subchondral cyst which appears inconsequential ?(series 14, image 21). No stenosis. ?  ?L4-L5: Normal disc.  Borderline to mild facet and ligament flavum ?hypertrophy. Trace degenerative facet joint fluid. No stenosis. ?  ?L5-S1: Normal disc signal. Minimal circumferential disc bulge and ?endplate spurring. Borderline to mild facet hypertrophy. Widely ?patent thecal sac and lateral recesses. Borderline to mild bilateral ?L5 neural foraminal stenosis. ?  ?IMPRESSION: ?Essentially normal for age lumbar spine; there is minimal lower ?lumbar degeneration superimposed on a capacious spinal canal with no ?spinal stenosis or convincing neural impingement. ?  ?  ?Electronically Signed ?  By: Genevie Ann M.D. ?  On: 02/22/2017 08:46 ?Pain Inventory ?Average Pain 7 ?Pain Right Now 7 ?My pain is sharp and aching ? ?In the last 24 hours, has pain interfered with the following? ?General activity 5 ?Relation with others 0 ?Enjoyment of life 0 ?What TIME of day is your pain at its worst? morning  ?Sleep (in general) Fair ? ?Pain is worse with: walking ?Pain improves with:  na ?Relief from Meds: 5 ? ?walk with assistance ?use a cane ?how many minutes can you walk? 45 ?ability to climb steps?  no ?do you drive?  no ? ?not employed: date last employed 01/2021 ? ?trouble walking ?depression ?anxiety ? ?Any changes since last visit?  no ? ?Neurologist Dr Delice Lesch ? ? ? ?Family History  ?Problem Relation Age of Onset  ? Heart disease Mother   ? Heart attack Mother   ? Cancer Father   ?     colon  ? Hypertension Father   ? Cancer Sister   ?     brain  ? Hypertension Sister   ? Cancer Sister   ?     unsure of type  ? Diabetes Sister   ?     type 2  ? Heart attack Sister   ? Cancer Brother   ? Hypertension Brother   ? Colon cancer Neg Hx   ? Esophageal cancer Neg Hx   ? Pancreatic cancer Neg Hx   ? Stomach cancer Neg Hx   ? ?Social History  ? ?Socioeconomic History  ? Marital status: Married  ?  Spouse name: Gwyndolyn Saxon  ? Number of children: 2  ? Years of education: Not on file  ? Highest education level: Not on file  ?Occupational History  ? Occupation:  CMA-retired  ?Tobacco Use  ? Smoking status: Every Day  ?  Packs/day: 0.50  ?  Years: 40.00  ?  Pack years: 20.00  ?  Types: Cigarettes  ?  Start date: 12/18/2015  ? Smokeless tobacco: Never  ? Tobacco comments:  ?  3-4 cigarettes daily  ?Vaping Use  ? Vaping Use: Never used  ?Substance and Sexual Activity  ? Alcohol use: Yes  ?  Comment: wine occassioanlly   ? Drug use: No  ? Sexual activity: Not on file  ?  Comment: lives with husband, no dietary restrictions.   ?Other Topics Concern  ? Not on file  ?Social History Narrative  ? Lives with her husband and their son.  ? Daughter lives in [place], [place].  ? Right handed  ? ?Social Determinants of Health  ? ?Financial Resource Strain: Not on file  ?Food Insecurity: Not on file  ?  Transportation Needs: Not on file  ?Physical Activity: Not on file  ?Stress: Not on file  ?Social Connections: Not on file  ? ?Past Surgical History:  ?Procedure Laterality Date  ? ABDOMINAL HYSTERECTOMY  04/06/04  ? APPENDECTOMY    ? BILATERAL SALPINGOOPHORECTOMY  04/06/04  ? CARDIAC EVENT MONITOR  02/2016  ? Mostly sinus bradycardia and sinus rhythm.  Rare PVCs and PACs.  No arrhythmias.  Symptoms of chest pain and fluttering as well as "passed out spell" noted with normal sinus rhythm.  ? MASS EXCISION Left 06/02/2015  ? Procedure: EXCISION LEFT ARM MASS;  Surgeon: Autumn Messing III, MD;  Location: [facility];  Service: General;  Laterality: Left;  ? NM MYOVIEW LTD  04/2016  ? Reached heart rate of 127 bpm with Lexiscan.  EF 60 to 65%.  LOW RISK.  No ischemia or infarction.  ? ROTATOR CUFF REPAIR    ? TRANSTHORACIC ECHOCARDIOGRAM  01/2016  ? F 55-6%.  No R WMA.  GR 1 DD.  Normal valves.  ? ?Past Medical History:  ?Diagnosis Date  ? Anemia   ? Anxiety   ? Arm skin lesion, left 09/05/2014  ? Constipation 12/27/2013  ? Depression   ? Dysphagia 04/15/2016  ? Eczema 08/22/2015  ? GERD (gastroesophageal reflux disease)   ? Hematuria 03/04/2016  ? History of shingles 01/22/2016  ? Hyperglycemia  01/15/2015  ? Hyperlipidemia, mixed 08/22/2015  ? Hypertension   ? Menometrorrhagia 2006  ? Migraine, unspecified, without mention of intractable migraine without mention of status migrainosus   ? Preventative hea

## 2021-06-16 NOTE — Telephone Encounter (Signed)
Pt's husband states pt need medication refill, prescription was prescribed by historical provider.  ? ?amLODipine (NORVASC) 5 MG tablet [[id number] ?[id number] ?[id number] ?Phone:  [phone] Fax:  [phone] ?

## 2021-06-16 NOTE — Telephone Encounter (Signed)
Patient notified of sleep study appointment. 

## 2021-06-16 NOTE — Patient Instructions (Signed)
Get your xray at [facility] imaging prior to next visit ?

## 2021-06-16 NOTE — Telephone Encounter (Signed)
Refill sent.

## 2021-06-17 LAB — BASIC METABOLIC PANEL
BUN/Creatinine Ratio: 21 (ref 12–28)
BUN: 10 mg/dL (ref 8–27)
CO2: 20 mmol/L (ref 20–29)
Calcium: 9.3 mg/dL (ref 8.7–10.3)
Chloride: 107 mmol/L — ABNORMAL HIGH (ref 96–106)
Creatinine, Ser: 0.48 mg/dL — ABNORMAL LOW (ref 0.57–1.00)
Glucose: 71 mg/dL (ref 70–99)
Potassium: 4.3 mmol/L (ref 3.5–5.2)
Sodium: 146 mmol/L — ABNORMAL HIGH (ref 134–144)
eGFR: 107 mL/min/{1.73_m2} (ref >59)

## 2021-06-17 LAB — CBC WITH DIFFERENTIAL/PLATELET

## 2021-06-17 LAB — TSH+T4F+T3FREE
Free T4: 1.38 ng/dL (ref 0.82–1.77)
T3, Free: 3.3 pg/mL (ref 2.0–4.4)
TSH: 1.62 u[IU]/mL (ref 0.450–4.500)

## 2021-06-17 LAB — MAGNESIUM: Magnesium: 1.9 mg/dL (ref 1.6–2.3)

## 2021-06-17 LAB — HEMOGLOBIN A1C

## 2021-06-17 LAB — VITAMIN D 25 HYDROXY (VIT D DEFICIENCY, FRACTURES): Vit D, 25-Hydroxy: 34.2 ng/mL (ref 30.0–100.0)

## 2021-06-20 ENCOUNTER — Ambulatory Visit: Payer: 59 | Attending: Family Medicine

## 2021-06-20 DIAGNOSIS — M6281 Muscle weakness (generalized): Secondary | ICD-10-CM | POA: Insufficient documentation

## 2021-06-20 DIAGNOSIS — R2689 Other abnormalities of gait and mobility: Secondary | ICD-10-CM | POA: Diagnosis present

## 2021-06-20 NOTE — Therapy (Signed)
?OUTPATIENT PHYSICAL THERAPY TREATMENT NOTE ? ? ?Patient Name: Ruth Bell ?MRN: [id number] ?DOB:[birth year], 61 y.o., female ?Today's Date: 06/20/2021 ? ?PCP: Mosie Lukes, MD ?REFERRING PROVIDER: Ellouise Newer ? ? PT End of Session - 06/20/21 1023   ? ? Visit Number 13   ? Number of Visits 16   ? Date for PT Re-Evaluation 07/07/21   ? Authorization Type [employer]   ? PT Start Time 1022   ? PT Stop Time 1057   ? PT Time Calculation (min) 35 min   ? Equipment Utilized During Treatment Gait belt   ? Activity Tolerance Patient tolerated treatment well   ? Behavior During Therapy [facility] for tasks assessed/performed   ? ?  ?  ? ?  ? ? ?Past Medical History:  ?Diagnosis Date  ? Anemia   ? Anxiety   ? Arm skin lesion, left 09/05/2014  ? Constipation 12/27/2013  ? Depression   ? Dysphagia 04/15/2016  ? Eczema 08/22/2015  ? GERD (gastroesophageal reflux disease)   ? Hematuria 03/04/2016  ? History of shingles 01/22/2016  ? Hyperglycemia 01/15/2015  ? Hyperlipidemia, mixed 08/22/2015  ? Hypertension   ? Menometrorrhagia 2006  ? Migraine, unspecified, without mention of intractable migraine without mention of status migrainosus   ? Preventative health care 11/04/2016  ? Pseudoseizures   ? Seizures ([place])   ? history of pseudoseizure disorder, last last seizure 3 wks, keppra inc in dosage  ? Tubular adenoma of colon 05/2011  ? Uterine leiomyoma 2006  ? ?Past Surgical History:  ?Procedure Laterality Date  ? ABDOMINAL HYSTERECTOMY  04/06/04  ? APPENDECTOMY    ? BILATERAL SALPINGOOPHORECTOMY  04/06/04  ? CARDIAC EVENT MONITOR  02/2016  ? Mostly sinus bradycardia and sinus rhythm.  Rare PVCs and PACs.  No arrhythmias.  Symptoms of chest pain and fluttering as well as "passed out spell" noted with normal sinus rhythm.  ? MASS EXCISION Left 06/02/2015  ? Procedure: EXCISION LEFT ARM MASS;  Surgeon: Autumn Messing III, MD;  Location: [facility];  Service: General;  Laterality: Left;  ? NM MYOVIEW LTD  04/2016  ? Reached heart rate  of 127 bpm with Lexiscan.  EF 60 to 65%.  LOW RISK.  No ischemia or infarction.  ? ROTATOR CUFF REPAIR    ? TRANSTHORACIC ECHOCARDIOGRAM  01/2016  ? F 55-6%.  No R WMA.  GR 1 DD.  Normal valves.  ? ?Patient Active Problem List  ? Diagnosis Date Noted  ? Left-sided weakness 01/31/2021  ? Gout 01/31/2021  ? Urinary frequency 05/07/2017  ? Abdominal bloating 05/07/2017  ? Back pain 03/21/2017  ? Tachycardia 02/28/2017  ? Preventative health care 11/04/2016  ? Hoarseness 04/15/2016  ? Dysphagia 04/15/2016  ? Atypical chest pain 04/13/2016  ? Dysuria 03/04/2016  ? Hematuria 03/04/2016  ? Palpitations 01/30/2016  ? History of shingles 01/22/2016  ? Eczema 08/22/2015  ? Hyperlipidemia, mixed 08/22/2015  ? Hyperglycemia 01/15/2015  ? Lumbar radiculopathy 11/19/2014  ? GERD (gastroesophageal reflux disease) 09/05/2014  ? Noncompliance with medications 08/11/2014  ? Constipation 12/27/2013  ? Pain in joint, shoulder region 10/13/2013  ? Shoulder pain 09/23/2013  ? Numbness and tingling in right hand 09/23/2013  ? Jaw pain 03/26/2013  ? Neck pain 05/17/2012  ? Seizure disorder ([place]) 02/21/2012  ? Non compliance w medication regimen 02/21/2012  ? Essential hypertension 09/06/2011  ? Abdominal pain 06/20/2011  ? Hemorrhoids 06/20/2011  ? Hx of adenomatous colonic polyps 06/13/2011  ?  Tubular adenoma of colon 05/2011  ? Musculoskeletal chest pain 04/23/2011  ? Radiculopathy of leg 02/02/2011  ? History of pseudoseizure 07/10/2010  ? Anxiety and depression 12/29/2009  ? TINEA PEDIS 06/02/2009  ? Exertional dyspnea 01/13/2008  ? MICROSCOPIC HEMATURIA 06/24/2007  ? WEIGHT LOSS 04/28/2007  ? Anemia 03/10/2007  ? Migraine 03/10/2007  ? ARTHRITIS 03/10/2007  ? SYNCOPE 03/10/2007  ? Menometrorrhagia 2006  ? ? ?REFERRING DIAG: left sided weakness, psychogenic nonepileptic seizure ? ?THERAPY DIAG:  ?Other abnormalities of gait and mobility ? ?Muscle weakness (generalized) ? ?PERTINENT HISTORY: anemia, anxiety, depression, HTN, HLD,  migraine- stress triggered ? ? ?PRECAUTIONS: fall ? ?SUBJECTIVE: Pt reports she saw the pain management doctor last Thursday. Reports having increased pain since then as they moved her leg a lot. Having more spasms in low back. She said they were talking about doing some injections in arm and back.  ? ?PAIN:  ?Are you having pain? no ?NPRS scale: 9/10 ?Pain location: left low back earlier down left leg ?Pain orientation: Left and Lower  ?PAIN TYPE: chronic ?Pain description: nagging, sharp ?Aggravating factors: up a lot, at night ?Relieving factors: rest, heat ? ? ? ?TODAY'S TREATMENT:  ? 06/20/21: ?NuStep x  9 min level 4 with BUE and BLE. Moist heat to low back during performance to try to help with pain and spasms. Pt reports still having pain after especially with change in position from sit to stand but the heat does feel good.  ?Pt ambulated outside 1400' on varied surfaces (sidewalk, grass) with antalgic gait on left mod I. Cued pt to try to increase right step length some. No LOB with gait. 1 standing rest break due to back/leg pain. ?  ?  ? ? ?  ? ? ? ?HOME EXERCISE PROGRAM: ?[place] ?  Added RTB resistance for side stepping and marching ? ? PT Short Term Goals - 05/10/21 1049   ? ?  ? PT SHORT TERM GOAL #1  ? Title Pt will be independent with progressive HEP for strengthening and balance to continue gains on own.   ? Time 4  ?Pt reports that exercises are going well. Denies any questions.  ? Period Weeks   ? Status Achieved  ? Target Date 06/07/21   ?  ? PT SHORT TERM GOAL #2  ? Title Pt will decrease TUG from 17.98 sec to <15 sec for improved balance and functional mobility.   ? Baseline 05/10/21 17.98 sec without AD , 06/05/21 10.4 sec  ? Time 4   ? Period Weeks   ? Status Achieved  ? Target Date 06/07/21   ?  ? PT SHORT TERM GOAL #3  ? Title Pt will be able to get in/out of car lifting left leg on own for improved mobility.   ? Time 4  ?06/05/21 Pt reports this is improving. Able to get in and out without  helping with hands  ? Period Weeks   ? Status Achieved  ? Target Date 06/07/21   ? ?  ?  ? ?  ? ? ? PT Long Term Goals - 05/10/21 1050   ? ?  ? PT LONG TERM GOAL #1  ? Title Pt will ambulate >1000' on varied surfaces without AD independently for improved community ambulation. (LTGs due 07/07/21)   ? Baseline 05/10/21 850' supervision with cane on varied surfaces outside. 06/20/21 1400' without AD with antalgic gait mod I.  ? Time 8   ? Period Weeks   ?  Status Partially met  ? Target Date 07/07/21   ?  ? PT LONG TERM GOAL #2  ? Title Pt will decrease 5 x sit to stand from 30.47 sec to <24 sec for improved balance and functional strength.   ? Baseline 04/19/21 27 sec without hands from mat table. 05/10/21 27.11 sec   ? Time 8   ? Period Weeks   ? Status On-going   ? Target Date 07/07/21   ?  ? PT LONG TERM GOAL #3  ? Title Pt will increase gait speed from 0.1ms to >0.857m for improved community mobility.   ? Baseline 03/29/21 0.559m 05/10/21 0.35m68momfortable and 0.81m/107mst   ? Time 8   ? Period Weeks   ? Status Revised   ? Target Date 07/07/21   ?  ? PT LONG TERM GOAL #4  ? Title Pt will ambulate up/down 4 steps with cane with reciprocal pattern for improved functional strength and home access.   ? Time 8   ? Period Weeks   ? Status New   ? Target Date 07/07/21   ? ?  ?  ? ?  ? ? ? Plan - 05/19/21 1032   ? ? Clinical Impression Statement Pt was having more pain today since her pain management visit. She was able to progress gait outside with further distance but was more antalgic today. Started with some heat while doing NuStep which did seem to help some.  ? Personal Factors and Comorbidities Comorbidity 3+;Behavior Pattern   ? Comorbidities anemia, anxiety, depression, HTN, HLD, migraine,   ? Examination-Activity Limitations Stairs;Locomotion Level;Transfers   ? Examination-Participation Restrictions Community Activity;Occupation;Meal Prep;Cleaning;Laundry   ? Stability/Clinical Decision Making Evolving/Moderate  complexity   ? Rehab Potential Fair   ? PT Frequency 1x / week   ? PT Duration 8 weeks   ? PT Treatment/Interventions DME Instruction;Gait training;Stair training;Functional mobility training;Therapeutic activ

## 2021-06-30 ENCOUNTER — Other Ambulatory Visit: Payer: Self-pay

## 2021-06-30 ENCOUNTER — Telehealth: Payer: Self-pay

## 2021-06-30 DIAGNOSIS — Z79899 Other long term (current) drug therapy: Secondary | ICD-10-CM

## 2021-06-30 DIAGNOSIS — E1100 Type 2 diabetes mellitus with hyperosmolarity without nonketotic hyperglycemic-hyperosmolar coma (NKHHC): Secondary | ICD-10-CM

## 2021-06-30 NOTE — Telephone Encounter (Signed)
Called pt's husband. He states they will come Monday to get her labs drawn. I encouraged him to make sure she hydrates over the weekend so they will be able to get her labs.  ?

## 2021-06-30 NOTE — Progress Notes (Signed)
Called pt's husband. He states they will come Monday to get her labs drawn. I encouraged him to make sure she hydrates over the weekend so they will be able to get her labs.  ?

## 2021-07-03 LAB — CBC WITH DIFFERENTIAL/PLATELET
Basophils Absolute: 0.1 10*3/uL (ref 0.0–0.2)
Basos: 1 %
EOS (ABSOLUTE): 0.2 10*3/uL (ref 0.0–0.4)
Eos: 2 %
Hematocrit: 38.7 % (ref 34.0–46.6)
Hemoglobin: 12.7 g/dL (ref 11.1–15.9)
Immature Grans (Abs): 0 10*3/uL (ref 0.0–0.1)
Immature Granulocytes: 0 %
Lymphocytes Absolute: 2.6 10*3/uL (ref 0.7–3.1)
Lymphs: 27 %
MCH: 27.5 pg (ref 26.6–33.0)
MCHC: 32.8 g/dL (ref 31.5–35.7)
MCV: 84 fL (ref 79–97)
Monocytes Absolute: 0.5 10*3/uL (ref 0.1–0.9)
Monocytes: 5 %
Neutrophils Absolute: 6.4 10*3/uL (ref 1.4–7.0)
Neutrophils: 65 %
Platelets: 307 10*3/uL (ref 150–450)
RBC: 4.61 x10E6/uL (ref 3.77–5.28)
RDW: 13.6 % (ref 11.7–15.4)
WBC: 9.7 10*3/uL (ref 3.4–10.8)

## 2021-07-03 LAB — HEMOGLOBIN A1C
Est. average glucose Bld gHb Est-mCnc: 117 mg/dL
Hgb A1c MFr Bld: 5.7 % — ABNORMAL HIGH (ref 4.8–5.6)

## 2021-07-04 ENCOUNTER — Ambulatory Visit: Payer: 59

## 2021-07-04 DIAGNOSIS — R2689 Other abnormalities of gait and mobility: Secondary | ICD-10-CM

## 2021-07-04 DIAGNOSIS — M6281 Muscle weakness (generalized): Secondary | ICD-10-CM

## 2021-07-04 NOTE — Therapy (Signed)
?OUTPATIENT PHYSICAL THERAPY TREATMENT NOTE/ Discharge Summary ? ? ?Patient Name: Ruth Bell ?MRN: [id number] ?DOB:May 08, 1960, 61 y.o., female ?Today's Date: 07/04/2021 ? ?PCP: Mosie Lukes, MD ?REFERRING PROVIDER: Ellouise Newer ?PHYSICAL THERAPY DISCHARGE SUMMARY ? ?Visits from Start of Care: 14 ? ?Current functional level related to goals / functional outcomes: ?See clinical impression and goals for more information. ?  ?Remaining deficits: ?Pain in left low back/leg at times ?  ?Education / Equipment: ?HEP  ? ?Patient agrees to discharge. Patient goals were met. Patient is being discharged due to meeting the stated rehab goals. ? ? ? PT End of Session - 07/04/21 1014   ? ? Visit Number 14   ? Number of Visits 16   ? Date for PT Re-Evaluation 07/07/21   ? Authorization Type [employer]   ? PT Start Time 1014   ? PT Stop Time 6295   discharge  ? PT Time Calculation (min) 23 min   ? Equipment Utilized During Treatment --   ? Activity Tolerance Patient tolerated treatment well   ? Behavior During Therapy [facility] for tasks assessed/performed   ? ?  ?  ? ?  ? ? ?Past Medical History:  ?Diagnosis Date  ? Anemia   ? Anxiety   ? Arm skin lesion, left 09/05/2014  ? Constipation 12/27/2013  ? Depression   ? Dysphagia 04/15/2016  ? Eczema 08/22/2015  ? GERD (gastroesophageal reflux disease)   ? Hematuria 03/04/2016  ? History of shingles 01/22/2016  ? Hyperglycemia 01/15/2015  ? Hyperlipidemia, mixed 08/22/2015  ? Hypertension   ? Menometrorrhagia 2006  ? Migraine, unspecified, without mention of intractable migraine without mention of status migrainosus   ? Preventative health care 11/04/2016  ? Pseudoseizures   ? Seizures ([place])   ? history of pseudoseizure disorder, last last seizure 3 wks, keppra inc in dosage  ? Tubular adenoma of colon 05/2011  ? Uterine leiomyoma 2006  ? ?Past Surgical History:  ?Procedure Laterality Date  ? ABDOMINAL HYSTERECTOMY  04/06/04  ? APPENDECTOMY    ? BILATERAL SALPINGOOPHORECTOMY  04/06/04  ?  CARDIAC EVENT MONITOR  02/2016  ? Mostly sinus bradycardia and sinus rhythm.  Rare PVCs and PACs.  No arrhythmias.  Symptoms of chest pain and fluttering as well as "passed out spell" noted with normal sinus rhythm.  ? MASS EXCISION Left 06/02/2015  ? Procedure: EXCISION LEFT ARM MASS;  Surgeon: Autumn Messing III, MD;  Location: [place];  Service: General;  Laterality: Left;  ? NM MYOVIEW LTD  04/2016  ? Reached heart rate of 127 bpm with Lexiscan.  EF 60 to 65%.  LOW RISK.  No ischemia or infarction.  ? ROTATOR CUFF REPAIR    ? TRANSTHORACIC ECHOCARDIOGRAM  01/2016  ? F 55-6%.  No R WMA.  GR 1 DD.  Normal valves.  ? ?Patient Active Problem List  ? Diagnosis Date Noted  ? Left-sided weakness 01/31/2021  ? Gout 01/31/2021  ? Urinary frequency 05/07/2017  ? Abdominal bloating 05/07/2017  ? Back pain 03/21/2017  ? Tachycardia 02/28/2017  ? Preventative health care 11/04/2016  ? Hoarseness 04/15/2016  ? Dysphagia 04/15/2016  ? Atypical chest pain 04/13/2016  ? Dysuria 03/04/2016  ? Hematuria 03/04/2016  ? Palpitations 01/30/2016  ? History of shingles 01/22/2016  ? Eczema 08/22/2015  ? Hyperlipidemia, mixed 08/22/2015  ? Hyperglycemia 01/15/2015  ? Lumbar radiculopathy 11/19/2014  ? GERD (gastroesophageal reflux disease) 09/05/2014  ? Noncompliance with medications 08/11/2014  ? Constipation  12/27/2013  ? Pain in joint, shoulder region 10/13/2013  ? Shoulder pain 09/23/2013  ? Numbness and tingling in right hand 09/23/2013  ? Jaw pain 03/26/2013  ? Neck pain 05/17/2012  ? Seizure disorder ([place]) 02/21/2012  ? Non compliance w medication regimen 02/21/2012  ? Essential hypertension 09/06/2011  ? Abdominal pain 06/20/2011  ? Hemorrhoids 06/20/2011  ? Hx of adenomatous colonic polyps 06/13/2011  ? Tubular adenoma of colon 05/2011  ? Musculoskeletal chest pain 04/23/2011  ? Radiculopathy of leg 02/02/2011  ? History of pseudoseizure 07/10/2010  ? Anxiety and depression 12/29/2009  ? TINEA PEDIS 06/02/2009  ?  Exertional dyspnea 01/13/2008  ? MICROSCOPIC HEMATURIA 06/24/2007  ? WEIGHT LOSS 04/28/2007  ? Anemia 03/10/2007  ? Migraine 03/10/2007  ? ARTHRITIS 03/10/2007  ? SYNCOPE 03/10/2007  ? Menometrorrhagia 2006  ? ? ?REFERRING DIAG: left sided weakness, psychogenic nonepileptic seizure ? ?THERAPY DIAG:  ?Other abnormalities of gait and mobility ? ?Muscle weakness (generalized) ? ?PERTINENT HISTORY: anemia, anxiety, depression, HTN, HLD, migraine- stress triggered ? ? ?PRECAUTIONS: fall ? ?SUBJECTIVE: Pt walked in smiling and without a limp. Reports she is doing well. She went for labwork at cardiologist yesterday. Waiting to hear back from pain management as they are backed up. ? ?PAIN:  ?Are you having pain? yes ?NPRS scale: did not rate anything ?Pain location: left low back  ?Pain orientation: Left and Lower  ?PAIN TYPE: chronic ?Pain description: tiny nagging ?Aggravating factors: up a lot, at night ?Relieving factors: rest, heat ? ? ? ?TODAY'S TREATMENT:  ? Gait speed without AD=1.59ms ?  ? ?STAIRS: ? Level of Assistance: Complete Independence ? Stair Negotiation Technique: Alternating Pattern  with No Rails ? Number of Stairs: 4  ? Height of Stairs: 6  ?Comments:  ? ?GAIT: ?Gait pattern: step through pattern ?Distance walked: 2000' ?Assistive device utilized: None ?Level of assistance: Complete Independence ?Comments: ambulated outside on varied surfaces including sidewalk and grass. Reported slight nagging in left low back towards end. Good reciprocal pattern with no antalgic pattern today. ? ?  ?5 x sit to stand=12.84 sec from chair without hands ? ?  ? ? ? ?HOME EXERCISE PROGRAM: ?[place]?   ? ? PT Short Term Goals - 05/10/21 1049   ? ?  ? PT SHORT TERM GOAL #1  ? Title Pt will be independent with progressive HEP for strengthening and balance to continue gains on own.   ? Time 4  ?Pt reports that exercises are going well. Denies any questions.  ? Period Weeks   ? Status Achieved  ? Target Date 06/07/21   ?   ? PT SHORT TERM GOAL #2  ? Title Pt will decrease TUG from 17.98 sec to <15 sec for improved balance and functional mobility.   ? Baseline 05/10/21 17.98 sec without AD , 06/05/21 10.4 sec  ? Time 4   ? Period Weeks   ? Status Achieved  ? Target Date 06/07/21   ?  ? PT SHORT TERM GOAL #3  ? Title Pt will be able to get in/out of car lifting left leg on own for improved mobility.   ? Time 4  ?06/05/21 Pt reports this is improving. Able to get in and out without helping with hands  ? Period Weeks   ? Status Achieved  ? Target Date 06/07/21   ? ?  ?  ? ?  ? ? ? PT Long Term Goals - 05/10/21 1050   ? ?  ? PT  LONG TERM GOAL #1  ? Title Pt will ambulate >1000' on varied surfaces without AD independently for improved community ambulation. (LTGs due 07/07/21)   ? Baseline 05/10/21 850' supervision with cane on varied surfaces outside. 06/20/21 1400' without AD with antalgic gait mod I. 07/04/21 Ambulated outside 2000' independently.  ? Time 8   ? Period Weeks   ? Status Met  ? Target Date 07/07/21   ?  ? PT LONG TERM GOAL #2  ? Title Pt will decrease 5 x sit to stand from 30.47 sec to <24 sec for improved balance and functional strength.   ? Baseline 04/19/21 27 sec without hands from mat table. 05/10/21 27.11 sec. 07/04/21 12.84 sec from chair without hands  ? Time 8   ? Period Weeks   ? Status Met   ? Target Date 07/07/21   ?  ? PT LONG TERM GOAL #3  ? Title Pt will increase gait speed from 0.76ms to >0.854m for improved community mobility.   ? Baseline 03/29/21 0.5572m 05/10/21 0.23m11momfortable and 0.75m/82mst. 07/04/21 1.5m/s17mTime 8   ? Period Weeks   ? Status Met  ? Target Date 07/07/21   ?  ? PT LONG TERM GOAL #4  ? Title Pt will ambulate up/down 4 steps with cane with reciprocal pattern for improved functional strength and home access.   ? Time 8   ? Period Weeks   ? Status Met- performed with reciprocal steps without rail  ? Target Date 07/07/21   ? ?  ?  ? ?  ? ? ? Plan - 05/19/21 1032   ? ? Clinical Impression  Statement Pt has met all goals at this time. She decreased 5 x sit to stand indicating improved balance. Pt is ambulating without AD and did not have antalgic pattern today. Gait speed of 1.5m/s 26mcates safe com

## 2021-07-11 ENCOUNTER — Ambulatory Visit: Payer: 59

## 2021-07-11 ENCOUNTER — Other Ambulatory Visit: Payer: Self-pay | Admitting: Family Medicine

## 2021-07-11 NOTE — Telephone Encounter (Signed)
Requesting: Fioricet 50-325-'40mg'$  ?Contract:  04/10/16 ?UDS: 01/22/21 ?Last Visit: 05/11/21 w/ Percell Miller ?Next Visit: None ?Last Refill: 06/16/21 #60 and 0RF ? ?Please Advise ? ?

## 2021-07-13 ENCOUNTER — Encounter (HOSPITAL_COMMUNITY): Payer: Self-pay

## 2021-07-13 ENCOUNTER — Emergency Department (HOSPITAL_COMMUNITY)
Admission: EM | Admit: 2021-07-13 | Discharge: 2021-07-13 | Disposition: A | Discharge status: Home / Self Care | Payer: 59 | Attending: Emergency Medicine | Admitting: Emergency Medicine

## 2021-07-13 DIAGNOSIS — Z23 Encounter for immunization: Secondary | ICD-10-CM | POA: Diagnosis not present

## 2021-07-13 DIAGNOSIS — Z7982 Long term (current) use of aspirin: Secondary | ICD-10-CM | POA: Diagnosis not present

## 2021-07-13 DIAGNOSIS — T22031A Burn of unspecified degree of right upper arm, initial encounter: Secondary | ICD-10-CM | POA: Diagnosis present

## 2021-07-13 DIAGNOSIS — T22231A Burn of second degree of right upper arm, initial encounter: Secondary | ICD-10-CM | POA: Insufficient documentation

## 2021-07-13 DIAGNOSIS — X088XXA Exposure to other specified smoke, fire and flames, initial encounter: Secondary | ICD-10-CM | POA: Diagnosis not present

## 2021-07-13 DIAGNOSIS — T22211A Burn of second degree of right forearm, initial encounter: Secondary | ICD-10-CM | POA: Insufficient documentation

## 2021-07-13 DIAGNOSIS — T22241A Burn of second degree of right axilla, initial encounter: Secondary | ICD-10-CM | POA: Diagnosis not present

## 2021-07-13 DIAGNOSIS — T2006XA Burn of unspecified degree of forehead and cheek, initial encounter: Secondary | ICD-10-CM | POA: Diagnosis not present

## 2021-07-13 MED ORDER — BACITRACIN ZINC 500 UNIT/GM EX OINT
TOPICAL_OINTMENT | CUTANEOUS | Status: AC
  Filled 2021-07-13: qty 0.9

## 2021-07-13 MED ORDER — TETANUS-DIPHTHERIA TOXOIDS TD 5-2 LFU IM INJ
0.5000 mL | INJECTION | Freq: Once | INTRAMUSCULAR | Status: AC
  Administered 2021-07-13: 0.5 mL via INTRAMUSCULAR
  Filled 2021-07-13: qty 0.5

## 2021-07-13 MED ORDER — OXYCODONE-ACETAMINOPHEN 5-325 MG PO TABS
1.0000 | ORAL_TABLET | Freq: Four times a day (QID) | ORAL | 0 refills | Status: DC | PRN
Start: 2021-07-13 — End: 2024-01-06
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
## 2021-07-24 ENCOUNTER — Ambulatory Visit (HOSPITAL_BASED_OUTPATIENT_CLINIC_OR_DEPARTMENT_OTHER): Payer: 59 | Attending: Cardiology | Admitting: Cardiovascular Disease

## 2021-07-24 DIAGNOSIS — R400 Somnolence: Secondary | ICD-10-CM

## 2021-07-24 DIAGNOSIS — R5383 Other fatigue: Secondary | ICD-10-CM

## 2021-07-24 DIAGNOSIS — R0683 Snoring: Secondary | ICD-10-CM | POA: Diagnosis not present

## 2021-07-24 DIAGNOSIS — G4733 Obstructive sleep apnea (adult) (pediatric): Secondary | ICD-10-CM | POA: Insufficient documentation

## 2021-07-31 ENCOUNTER — Encounter (HOSPITAL_BASED_OUTPATIENT_CLINIC_OR_DEPARTMENT_OTHER): Payer: Self-pay | Admitting: Cardiovascular Disease

## 2021-07-31 NOTE — Procedures (Signed)
? ? ?Patient Name: Ruth Bell, Ruth Bell ?Study Date: 07/24/2021 ?Gender: Female ?D.O.B: Feb 21, 1961 ?Age (years): 63 ?Referring Provider: Berniece Salines DO ?Height (inches): 62 ?Interpreting Physician: Shelva Majestic MD, ABSM ?Weight (lbs): 114 ?RPSGT: Zadie Rhine ?BMI: 21 ?MRN: [id number] ?Neck Size: 12.00 ? ?CLINICAL INFORMATION ?Sleep Study Type: NPSG ? ?Indication for sleep study: Congestive Heart Failure, Excessive Daytime Sleepiness ? ?Epworth Sleepiness Score: 0 ? ?SLEEP STUDY TECHNIQUE ?As per the AASM Manual for the Scoring of Sleep and Associated Events v2.3 (April 2016) with a hypopnea requiring 4% desaturations. ? ?The channels recorded and monitored were frontal, central and occipital EEG, electrooculogram (EOG), submentalis EMG (chin), nasal and oral airflow, thoracic and abdominal wall motion, anterior tibialis EMG, snore microphone, electrocardiogram, and pulse oximetry. ? ?MEDICATIONS ?amLODipine (NORVASC) 5 MG tablet ?aspirin 81 MG chewable tablet ?atorvastatin (LIPITOR) 10 MG tablet ?butalbital-acetaminophen-caffeine (FIORICET) 50-325-40 MG tablet ?esomeprazole (NEXIUM) 40 MG capsule ?levETIRAcetam (KEPPRA) 500 MG tablet ?meloxicam (MOBIC) 15 MG tablet ?metoprolol succinate (TOPROL XL) 50 MG 24 hr tablet ?oxyCODONE-acetaminophen (PERCOCET/ROXICET) 5-325 MG tablet ?tiZANidine (ZANAFLEX) 2 MG tablet ?Medications self-administered by patient taken the night of the study : N/A ? ?SLEEP ARCHITECTURE ?The study was initiated at 10:18:00 PM and ended at 5:04:06 AM. ? ?Sleep onset time was 19.4 minutes and the sleep efficiency was 85.2%%. The total sleep time was 346 minutes. ? ?Stage REM latency was 74.0 minutes. ? ?The patient spent 3.9%% of the night in stage N1 sleep, 58.1%% in stage N2 sleep, 7.7%% in stage N3 and 30.4% in REM. ? ?Alpha intrusion was absent. ? ?Supine sleep was 50.72%. ? ?RESPIRATORY PARAMETERS ?The overall apnea/hypopnea index (AHI) was 1.4 per hour. The respiratory disturbance index (RDI)  was 2.8/h. There were 3 total apneas, including 2 obstructive, 1 central and 0 mixed apneas. There were 5 hypopneas and 8 RERAs. ? ?The AHI during Stage REM sleep was 2.9 per hour. ? ?AHI while supine was 2.4 per hour. ? ?The mean oxygen saturation was 95.2%. The minimum SpO2 during sleep was 88.0%. ? ?Moderate snoring was noted during this study. ? ?CARDIAC DATA ?The 2 lead EKG demonstrated sinus rhythm. The mean heart rate was 68.0 beats per minute. Other EKG findings include: None. ? ?LEG MOVEMENT DATA ?The total PLMS were 0 with a resulting PLMS index of 0.0. Associated arousal with leg movement index was 0.0 . ? ?IMPRESSIONS ?- No significant obstructive sleep apnea occurred during this study (AHI  1.4/h; RDI 2.8/h). ?- No significant central sleep apnea occurred during this study (CAI  0.2/h). ?- The patient had minimal  oxygen desaturation during the study (Min O2 88.0%) ?- The patient snored with moderate snoring volume. ?- No significant cardiac abnormalities were noted during this study; rare PACs ?- Clinically significant periodic limb movements did not occur during sleep. No significant associated arousals. ? ?DIAGNOSIS ?- Nocturnal Hypoxemia (G47.36) ?- Snoring ? ?RECOMMENDATIONS ?- At present there is no indication for CPAP therapy. ?- Effort should be made to optimize nasal and orophayngeal patency. ?- Consider alternatives for the treatment of snoring. ?- Avoid alcohol, sedatives and other CNS depressants that may worsen sleep apnea and disrupt normal sleep architecture. ?- Sleep hygiene should be reviewed to assess factors that may improve sleep quality. ?- Weight management and regular exercise should be initiated or continued if appropriate. ? ?[Electronically signed] 07/31/2021 09:09 AM ? ?Shelva Majestic MD, [facility], ABSM ?Diplomate, Tax adviser of Sleep Medicine ? ?NPI: [id number] ? ?[place] ?PH: [phone]   FX: [phone]  [phone] ?ACCREDITED BY THE AMERICAN ACADEMY OF  SLEEP MEDICINE ? ?

## 2021-08-06 ENCOUNTER — Other Ambulatory Visit: Payer: Self-pay | Admitting: Family Medicine

## 2021-08-06 DIAGNOSIS — R52 Pain, unspecified: Secondary | ICD-10-CM

## 2021-08-08 ENCOUNTER — Telehealth: Payer: Self-pay | Admitting: *Deleted

## 2021-08-08 ENCOUNTER — Encounter: Payer: 59 | Admitting: Physical Medicine & Rehabilitation

## 2021-08-08 NOTE — Telephone Encounter (Signed)
Patient notified of sleep study results via mychart.

## 2021-08-08 NOTE — Telephone Encounter (Signed)
-----   Message from Troy Sine, MD sent at 07/31/2021  9:14 AM EDT ----- Mariann Laster, please notify pt of the result.

## 2021-08-24 ENCOUNTER — Encounter: Payer: Self-pay | Admitting: Neurology

## 2021-10-20 ENCOUNTER — Encounter: Payer: 59 | Attending: Physical Medicine & Rehabilitation | Admitting: Physical Medicine & Rehabilitation

## 2021-12-06 ENCOUNTER — Ambulatory Visit: Payer: Self-pay | Admitting: Neurology

## 2021-12-10 ENCOUNTER — Other Ambulatory Visit: Payer: Self-pay | Admitting: Family Medicine

## 2021-12-10 DIAGNOSIS — I10 Essential (primary) hypertension: Secondary | ICD-10-CM

## 2021-12-21 ENCOUNTER — Encounter: Payer: Self-pay | Admitting: Neurology

## 2021-12-21 ENCOUNTER — Ambulatory Visit: Payer: 59 | Admitting: Neurology

## 2021-12-21 DIAGNOSIS — Z029 Encounter for administrative examinations, unspecified: Secondary | ICD-10-CM

## 2021-12-25 DIAGNOSIS — T310 Burns involving less than 10% of body surface: Secondary | ICD-10-CM | POA: Diagnosis not present

## 2021-12-25 DIAGNOSIS — M25611 Stiffness of right shoulder, not elsewhere classified: Secondary | ICD-10-CM | POA: Diagnosis not present

## 2021-12-25 DIAGNOSIS — M25641 Stiffness of right hand, not elsewhere classified: Secondary | ICD-10-CM | POA: Diagnosis not present

## 2021-12-25 DIAGNOSIS — M792 Neuralgia and neuritis, unspecified: Secondary | ICD-10-CM | POA: Diagnosis not present

## 2021-12-25 DIAGNOSIS — L299 Pruritus, unspecified: Secondary | ICD-10-CM | POA: Diagnosis not present

## 2022-01-11 ENCOUNTER — Ambulatory Visit: Payer: 59 | Admitting: Cardiology

## 2022-01-25 ENCOUNTER — Other Ambulatory Visit: Payer: Self-pay | Admitting: Family Medicine

## 2022-01-30 ENCOUNTER — Telehealth: Payer: Self-pay | Admitting: Family Medicine

## 2022-01-31 ENCOUNTER — Encounter: Payer: Self-pay | Admitting: Cardiology

## 2022-01-31 ENCOUNTER — Ambulatory Visit: Payer: No Typology Code available for payment source | Attending: Cardiology | Admitting: Cardiology

## 2022-01-31 VITALS — BP 142/94 | HR 66 | Ht 62.0 in | Wt 108.0 lb

## 2022-01-31 DIAGNOSIS — I10 Essential (primary) hypertension: Secondary | ICD-10-CM

## 2022-01-31 DIAGNOSIS — E782 Mixed hyperlipidemia: Secondary | ICD-10-CM | POA: Diagnosis not present

## 2022-01-31 MED ORDER — METOPROLOL SUCCINATE ER 50 MG PO TB24: ORAL_TABLET | ORAL | 3 refills | Status: DC

## 2022-01-31 NOTE — Patient Instructions (Signed)
Medication Instructions:  Your physician has recommended you make the following change in your medication: START:Toprol '100mg'$  am and '75mg'$  pm  *If you need a refill on your cardiac medications before your next appointment, please call your pharmacy*   Lab Work: NONE If you have labs (blood work) drawn today and your tests are completely normal, you will receive your results only by: [place] (if you have MyChart) OR A paper copy in the mail If you have any lab test that is abnormal or we need to change your treatment, we will call you to review the results.   Testing/Procedures: NONE   Follow-Up: At [facility], you and your health needs are our priority.  As part of our continuing mission to provide you with exceptional heart care, we have created designated Provider Care Teams.  These Care Teams include your primary Cardiologist (physician) and Advanced Practice Providers (APPs -  Physician Assistants and Nurse Practitioners) who all work together to provide you with the care you need, when you need it.  We recommend signing up for the patient portal called "MyChart".  Sign up information is provided on this After Visit Summary.  MyChart is used to connect with patients for Virtual Visits (Telemedicine).  Patients are able to view lab/test results, encounter notes, upcoming appointments, etc.  Non-urgent messages can be sent to your provider as well.   To learn more about what you can do with MyChart, go to [email or web address].    Your next appointment:   9 month(s)  The format for your next appointment:   In Person  Provider:   Berniece Salines, DO

## 2022-01-31 NOTE — Progress Notes (Unsigned)
Cardiology Office Note:    Date:  02/01/2022   ID:  Ruth Bell, DOB 09-27-1960, MRN [id number]  PCP:  Mosie Lukes, MD  Cardiologist:  Berniece Salines, DO  Electrophysiologist:  None   Referring MD: Mosie Lukes, MD   " I am still having palpitations"  History of Present Illness:    Ruth Bell is a 61 y.o. female with a hx of hyperlipidemia, diabetes mellitus hemoglobin A1c 6.5, hypertension and GERD here today for follow-up visit.  Chart review the patient was seen in 2018 cardiology at that time she had work-up with echocardiogram as well as nuclear stress test which were all within normal limits.  Recently in January 2023 she did see Dr. Bettina Gavia for palpitations during that time they did a ZIO monitor which was not impressive for any arrhythmia.  She was started on Lopressor 75 mg twice daily for the palpitations.  In the interim the patient requested to transition her care to our [facility] office she is here today for follow-up visit.  This is my first interaction with the patient.  She is here with her husband.  She tells me that she has been experiencing intermittent palpitations.  Mostly at nighttime.  She has not had any lightheadedness or dizziness.  What she complains about is the fact that she is experiencing significant fatigue daytime somnolence and her husband who is with her in the office today reports that she snores  Past Medical History:  Diagnosis Date   Anemia    Anxiety    Arm skin lesion, left 09/05/2014   Constipation 12/27/2013   Depression    Dysphagia 04/15/2016   Eczema 08/22/2015   GERD (gastroesophageal reflux disease)    Hematuria 03/04/2016   History of shingles 01/22/2016   Hyperglycemia 01/15/2015   Hyperlipidemia, mixed 08/22/2015   Hypertension    Menometrorrhagia 2006   Migraine, unspecified, without mention of intractable migraine without mention of status migrainosus    Preventative health care 11/04/2016   Pseudoseizures     Seizures ([place])    history of pseudoseizure disorder, last last seizure 3 wks, keppra inc in dosage   Tubular adenoma of colon 05/2011   Uterine leiomyoma 2006    Past Surgical History:  Procedure Laterality Date   ABDOMINAL HYSTERECTOMY  04/06/04   APPENDECTOMY     BILATERAL SALPINGOOPHORECTOMY  04/06/04   CARDIAC EVENT MONITOR  02/2016   Mostly sinus bradycardia and sinus rhythm.  Rare PVCs and PACs.  No arrhythmias.  Symptoms of chest pain and fluttering as well as "passed out spell" noted with normal sinus rhythm.   MASS EXCISION Left 06/02/2015   Procedure: EXCISION LEFT ARM MASS;  Surgeon: Autumn Messing III, MD;  Location: [place];  Service: General;  Laterality: Left;   NM MYOVIEW LTD  04/2016   Reached heart rate of 127 bpm with Lexiscan.  EF 60 to 65%.  LOW RISK.  No ischemia or infarction.   ROTATOR CUFF REPAIR     TRANSTHORACIC ECHOCARDIOGRAM  01/2016   F 55-6%.  No R WMA.  GR 1 DD.  Normal valves.    Current Medications: Current Meds  Medication Sig   amLODipine (NORVASC) 5 MG tablet TAKE 1 TABLET (5 MG TOTAL) BY MOUTH DAILY.   ascorbic acid (VITAMIN C) 100 MG tablet Take 1 tablet by mouth daily.   aspirin 81 MG chewable tablet Chew 81 mg by mouth daily.   atorvastatin (LIPITOR) 10 MG tablet TAKE 1  TABLET BY MOUTH EVERY DAY   butalbital-acetaminophen-caffeine (FIORICET) 50-325-40 MG tablet TAKE 1 TABLET BY MOUTH TWICE A DAY   citalopram (CELEXA) 40 MG tablet Take 1 tablet by mouth daily.   clorazepate (TRANXENE) 3.75 MG tablet Take by mouth.   esomeprazole (NEXIUM) 40 MG capsule Take 1 capsule (40 mg total) by mouth daily. Use as needed for gerd   hydrOXYzine (ATARAX) 10 MG tablet Take by mouth.   levETIRAcetam (KEPPRA) 500 MG tablet Take 500 mg by mouth 2 (two) times daily.   meloxicam (MOBIC) 15 MG tablet TAKE 1 TABLET (15 MG TOTAL) BY MOUTH DAILY.   omeprazole (PRILOSEC) 20 MG capsule Take by mouth.   oxyCODONE-acetaminophen (PERCOCET/ROXICET) 5-325 MG  tablet Take 1-2 tablets by mouth every 6 (six) hours as needed for severe pain.   polyethylene glycol powder (GLYCOLAX/MIRALAX) 17 GM/SCOOP powder Take by mouth.   tiZANidine (ZANAFLEX) 2 MG tablet Take 1 tablet (2 mg total) by mouth every 6 (six) hours as needed.   [DISCONTINUED] amLODipine (NORVASC) 2.5 MG tablet Take by mouth.   [DISCONTINUED] metoprolol succinate (TOPROL XL) 50 MG 24 hr tablet Take 1.5 tablets (75 mg total) by mouth in the morning and at bedtime. Take with or immediately following a meal.     Allergies:   Patient has no known allergies.   Social History   Socioeconomic History   Marital status: Married    Spouse name: Gwyndolyn Saxon   Number of children: 2   Years of education: Not on file   Highest education level: Not on file  Occupational History   Occupation: CMA-retired  Tobacco Use   Smoking status: Every Day    Packs/day: 0.50    Years: 40.00    Total pack years: 20.00    Types: Cigarettes    Start date: 12/18/2015   Smokeless tobacco: Never   Tobacco comments:    3-4 cigarettes daily  Vaping Use   Vaping Use: Never used  Substance and Sexual Activity   Alcohol use: Yes    Comment: wine occassioanlly    Drug use: No   Sexual activity: Not on file    Comment: lives with husband, no dietary restrictions.   Other Topics Concern   Not on file  Social History Narrative   Lives with her husband and their son.   Daughter lives in [place], [place].   Right handed   Social Determinants of Health   Financial Resource Strain: Not on file  Food Insecurity: Not on file  Transportation Needs: Not on file  Physical Activity: Not on file  Stress: Not on file  Social Connections: Not on file     Family History: The patient's family history includes Cancer in her brother, father, sister, and sister; Diabetes in her sister; Heart attack in her mother and sister; Heart disease in her mother; Hypertension in her brother, father, and sister. There is no history of  Colon cancer, Esophageal cancer, Pancreatic cancer, or Stomach cancer.  ROS:   Review of Systems  Constitution: Negative for decreased appetite, fever and weight gain.  HENT: Negative for congestion, ear discharge, hoarse voice and sore throat.   Eyes: Negative for discharge, redness, vision loss in right eye and visual halos.  Cardiovascular: Report palpitations.  Negative for chest pain, dyspnea on exertion, leg swelling, orthopnea. Respiratory: Negative for cough, hemoptysis, shortness of breath and snoring.   Endocrine: Negative for heat intolerance and polyphagia.  Hematologic/Lymphatic: Negative for bleeding problem. Does not bruise/bleed easily.  Skin: Negative  for flushing, nail changes, rash and suspicious lesions.  Musculoskeletal: Negative for arthritis, joint pain, muscle cramps, myalgias, neck pain and stiffness.  Gastrointestinal: Negative for abdominal pain, bowel incontinence, diarrhea and excessive appetite.  Genitourinary: Negative for decreased libido, genital sores and incomplete emptying.  Neurological: Negative for brief paralysis, focal weakness, headaches and loss of balance.  Psychiatric/Behavioral: Negative for altered mental status, depression and suicidal ideas.  Allergic/Immunologic: Negative for HIV exposure and persistent infections.    EKGs/Labs/Other Studies Reviewed:    The following studies were reviewed today:   EKG: None today  ZIO monitor annually 2023 Patch Wear Time:  7 days and 4 hours (2023-01-03T11:14:17-0500 to 2023-01-10T15:24:59-0500)   Patient had a min HR of 52 bpm, max HR of 156 bpm, and avg HR of 78 bpm. Predominant underlying rhythm was Sinus Rhythm.    1 run of Supraventricular Tachycardia occurred lasting 4 beats with a max rate of 156 bpm (avg 130 bpm). Some episodes of  Supraventricular Tachycardia may be possible Atrial Tachycardia with variable block. Isolated SVEs were rare (<1.0%), SVE Couplets were rare (<1.0%), and no SVE  Triplets were present.    Isolated VEs were rare (<1.0%), VE Couplets were rare (<1.0%), and no  Triplets were present. Ventricular Bigeminy and Trigeminy were present.   There were a triggered and 2 diary events all sinus rhythm sinus tachycardia and generally associated with either isolated or occasional PVCs.   Nuclear stress test February 2018 The left ventricular ejection fraction is normal (55-65%). Nuclear stress EF: 63%. There was no ST segment deviation noted during stress. The study is normal. This is a low risk study.  Echocardiogram December 2017 Study Conclusions   - Left ventricle: The cavity size was normal. Wall thickness was    normal. Systolic function was normal. The estimated ejection    fraction was in the range of 55% to 60%. Wall motion was normal;    there were no regional wall motion abnormalities. Doppler    parameters are consistent with abnormal left ventricular    relaxation (grade 1 diastolic dysfunction).  - Aortic valve: There was no stenosis.  - Mitral valve: There was no significant regurgitation.  - Right ventricle: The cavity size was normal. Systolic function    was normal.  - Pulmonary arteries: No complete TR doppler jet so unable to    estimate PA systolic pressure.  - Inferior vena cava: The vessel was normal in size. The    respirophasic diameter changes were in the normal range (>= 50%),    consistent with normal central venous pressure.   Impressions:   - Normal LV size with EF 55-60%. Normal RV size and systolic    function. No significant valvular abnormalities.    Recent Labs: 06/14/2021: BUN 10; Creatinine, Ser 0.48; Magnesium 1.9; Potassium 4.3; Sodium 146; TSH 1.620 07/03/2021: Hemoglobin 12.7; Platelets 307  Recent Lipid Panel    Component Value Date/Time   CHOL 220 (H) 12/21/2019 1135   TRIG 86 12/21/2019 1135   HDL 68 12/21/2019 1135   CHOLHDL 3.2 12/21/2019 1135   VLDL 30.4 02/05/2019 1023   LDLCALC 133 (H)  12/21/2019 1135    Physical Exam:    VS:  BP (!) 142/94 (BP Location: Right Arm, Patient Position: Sitting, Cuff Size: Normal)   Pulse 66   Ht '5\' 2"'$  (1.575 m)   Wt 108 lb (49 kg)   LMP  (LMP Unknown)   BMI 19.75 kg/m     Wt Readings from  Last 3 Encounters:  01/31/22 108 lb (49 kg)  07/24/21 114 lb (51.7 kg)  06/16/21 118 lb 3.2 oz (53.6 kg)     GEN: Well nourished, well developed in no acute distress HEENT: Normal NECK: No JVD; No carotid bruits LYMPHATICS: No lymphadenopathy CARDIAC: S1S2 noted,RRR, no murmurs, rubs, gallops RESPIRATORY:  Clear to auscultation without rales, wheezing or rhonchi  ABDOMEN: Soft, non-tender, non-distended, +bowel sounds, no guarding. EXTREMITIES: No edema, No cyanosis, no clubbing MUSCULOSKELETAL:  No deformity  SKIN: Warm and dry NEUROLOGIC:  Alert and oriented x 3, non-focal PSYCHIATRIC:  Normal affect, good insight  ASSESSMENT:    1. Essential hypertension   2. Hyperlipidemia, mixed     PLAN:    She still is experiencing palpitations will increase her toprol xl 100 mg in the am and 75 mg at night. Hope this gives some improvement. Continue lipid lowering regimen.  Hypertension - blood pressure is elevated in the office today. Will monitor hopefully the change above leads to improvement.  The patient is in agreement with the above plan. The patient left the office in stable condition.  The patient will follow up in 12 weeks or sooner if needed.   Medication Adjustments/Labs and Tests Ordered: Current medicines are reviewed at length with the patient today.  Concerns regarding medicines are outlined above.  Orders Placed This Encounter  Procedures   EKG 12-Lead   Meds ordered this encounter  Medications   metoprolol succinate (TOPROL XL) 50 MG 24 hr tablet    Sig: Take '100mg'$  (2 tablets) in the AM and '75mg'$  (1.5 tablets) in the PM    Dispense:  315 tablet    Refill:  3    Patient Instructions  Medication Instructions:   Your physician has recommended you make the following change in your medication: START:Toprol '100mg'$  am and '75mg'$  pm  *If you need a refill on your cardiac medications before your next appointment, please call your pharmacy*   Lab Work: NONE If you have labs (blood work) drawn today and your tests are completely normal, you will receive your results only by: [place] (if you have MyChart) OR A paper copy in the mail If you have any lab test that is abnormal or we need to change your treatment, we will call you to review the results.   Testing/Procedures: NONE   Follow-Up: At [facility], you and your health needs are our priority.  As part of our continuing mission to provide you with exceptional heart care, we have created designated Provider Care Teams.  These Care Teams include your primary Cardiologist (physician) and Advanced Practice Providers (APPs -  Physician Assistants and Nurse Practitioners) who all work together to provide you with the care you need, when you need it.  We recommend signing up for the patient portal called "MyChart".  Sign up information is provided on this After Visit Summary.  MyChart is used to connect with patients for Virtual Visits (Telemedicine).  Patients are able to view lab/test results, encounter notes, upcoming appointments, etc.  Non-urgent messages can be sent to your provider as well.   To learn more about what you can do with MyChart, go to [email or web address].    Your next appointment:   9 month(s)  The format for your next appointment:   In Person  Provider:   Berniece Salines, DO     Adopting a Healthy Lifestyle.  Know what a healthy weight is for you (roughly BMI <25) and aim to maintain  this   Aim for 7+ servings of fruits and vegetables daily   65-80+ fluid ounces of water or unsweet tea for healthy kidneys   Limit to max 1 drink of alcohol per day; avoid smoking/tobacco   Limit animal fats in diet for  cholesterol and heart health - choose grass fed whenever available   Avoid highly processed foods, and foods high in saturated/trans fats   Aim for low stress - take time to unwind and care for your mental health   Aim for 150 min of moderate intensity exercise weekly for heart health, and weights twice weekly for bone health   Aim for 7-9 hours of sleep daily   When it comes to diets, agreement about the perfect plan isnt easy to find, even among the experts. Experts at the [employer] developed an idea known as the Healthy Eating Plate. Just imagine a plate divided into logical, healthy portions.   The emphasis is on diet quality:   Load up on vegetables and fruits - one-half of your plate: Aim for color and variety, and remember that potatoes dont count.   Go for whole grains - one-quarter of your plate: Whole wheat, barley, wheat berries, quinoa, oats, brown rice, and foods made with them. If you want pasta, go with whole wheat pasta.   Protein power - one-quarter of your plate: Fish, chicken, beans, and nuts are all healthy, versatile protein sources. Limit red meat.   The diet, however, does go beyond the plate, offering a few other suggestions.   Use healthy plant oils, such as olive, canola, soy, corn, sunflower and peanut. Check the labels, and avoid partially hydrogenated oil, which have unhealthy trans fats.   If youre thirsty, drink water. Coffee and tea are good in moderation, but skip sugary drinks and limit milk and dairy products to one or two daily servings.   The type of carbohydrate in the diet is more important than the amount. Some sources of carbohydrates, such as vegetables, fruits, whole grains, and beans-are healthier than others.   Finally, stay active  Signed, Berniece Salines, DO  02/01/2022 8:45 PM    [facility]

## 2022-02-01 NOTE — Telephone Encounter (Signed)
Error

## 2022-05-09 ENCOUNTER — Other Ambulatory Visit: Payer: Self-pay | Admitting: Family Medicine

## 2022-05-09 DIAGNOSIS — I10 Essential (primary) hypertension: Secondary | ICD-10-CM

## 2022-07-26 ENCOUNTER — Other Ambulatory Visit: Payer: Self-pay | Admitting: Family Medicine

## 2022-08-17 ENCOUNTER — Ambulatory Visit
Admission: EM | Admit: 2022-08-17 | Discharge: 2022-08-17 | Disposition: A | Discharge status: Home / Self Care | Payer: 59 | Attending: Nurse Practitioner | Admitting: Nurse Practitioner

## 2022-08-17 ENCOUNTER — Ambulatory Visit (INDEPENDENT_AMBULATORY_CARE_PROVIDER_SITE_OTHER): Payer: Medicaid Other

## 2022-08-17 DIAGNOSIS — R053 Chronic cough: Secondary | ICD-10-CM

## 2022-08-17 DIAGNOSIS — R0602 Shortness of breath: Secondary | ICD-10-CM

## 2022-08-17 DIAGNOSIS — Z1152 Encounter for screening for COVID-19: Secondary | ICD-10-CM | POA: Insufficient documentation

## 2022-08-17 MED ORDER — IPRATROPIUM-ALBUTEROL 0.5-2.5 (3) MG/3ML IN SOLN
3.0000 mL | Freq: Once | RESPIRATORY_TRACT | Status: AC
  Administered 2022-08-17: 3 mL via RESPIRATORY_TRACT

## 2022-08-17 MED ORDER — PROMETHAZINE-DM 6.25-15 MG/5ML PO SYRP: 5.0000 mL | ORAL_SOLUTION | Freq: Four times a day (QID) | ORAL | 0 refills | Status: DC | PRN

## 2022-08-17 MED ORDER — PREDNISONE 10 MG (21) PO TBPK: ORAL_TABLET | Freq: Every day | ORAL | 0 refills | Status: DC

## 2022-08-17 NOTE — ED Triage Notes (Signed)
Pt presents to UC w/ c/o productive cough x5 weeks. Pt has loss of voice. Pt states she has coughing spells and "feels her throat tightening up." Denies hx asthma.

## 2022-08-17 NOTE — ED Provider Notes (Signed)
[facility]    CSN: [id number] Arrival date & time: 08/17/22  4098      History   Chief Complaint Chief Complaint  Patient presents with   Cough    HPI Ruth Bell is a 62 y.o. female.   HPI She is in today complaining of a 5-week history of productive cough.  She reports that she was told by her primary care provider to come in today for evaluation.  She is complaining of sore throat along with shortness of breath with occasional wheezing.  She is also complaining of pain in her middle back.  She is also having hoarseness.  She denies a history of hoarseness.  She feels like she phlegm makes it difficult for her to rest at night.  She feels like she is choking.  She also has a history of dysphagia in her chart.  She denies any chest pain.  Denies any recent exposures. Past Medical History:  Diagnosis Date   Anemia    Anxiety    Arm skin lesion, left 09/05/2014   Constipation 12/27/2013   Depression    Dysphagia 04/15/2016   Eczema 08/22/2015   GERD (gastroesophageal reflux disease)    Hematuria 03/04/2016   History of shingles 01/22/2016   Hyperglycemia 01/15/2015   Hyperlipidemia, mixed 08/22/2015   Hypertension    Menometrorrhagia 2006   Migraine, unspecified, without mention of intractable migraine without mention of status migrainosus    Preventative health care 11/04/2016   Pseudoseizures    Seizures (HCC)    history of pseudoseizure disorder, last last seizure 3 wks, keppra inc in dosage   Tubular adenoma of colon 05/2011   Uterine leiomyoma 2006    Patient Active Problem List   Diagnosis Date Noted   Left-sided weakness 01/31/2021   Gout 01/31/2021   Urinary frequency 05/07/2017   Abdominal bloating 05/07/2017   Back pain 03/21/2017   Tachycardia 02/28/2017   Preventative health care 11/04/2016   Hoarseness 04/15/2016   Dysphagia 04/15/2016   Atypical chest pain 04/13/2016   Dysuria 03/04/2016   Hematuria 03/04/2016   Palpitations 01/30/2016    History of shingles 01/22/2016   Eczema 08/22/2015   Hyperlipidemia, mixed 08/22/2015   Hyperglycemia 01/15/2015   Lumbar radiculopathy 11/19/2014   GERD (gastroesophageal reflux disease) 09/05/2014   Noncompliance with medications 08/11/2014   Constipation 12/27/2013   Pain in joint, shoulder region 10/13/2013   Shoulder pain 09/23/2013   Numbness and tingling in right hand 09/23/2013   Jaw pain 03/26/2013   Neck pain 05/17/2012   Seizure disorder (HCC) 02/21/2012   Non compliance w medication regimen 02/21/2012   Essential hypertension 09/06/2011   Abdominal pain 06/20/2011   Hemorrhoids 06/20/2011   Hx of adenomatous colonic polyps 06/13/2011   Tubular adenoma of colon 05/2011   Musculoskeletal chest pain 04/23/2011   Radiculopathy of leg 02/02/2011   History of pseudoseizure 07/10/2010   Anxiety and depression 12/29/2009   TINEA PEDIS 06/02/2009   Exertional dyspnea 01/13/2008   MICROSCOPIC HEMATURIA 06/24/2007   WEIGHT LOSS 04/28/2007   Anemia 03/10/2007   Migraine 03/10/2007   ARTHRITIS 03/10/2007   SYNCOPE 03/10/2007   Menometrorrhagia 2006    Past Surgical History:  Procedure Laterality Date   ABDOMINAL HYSTERECTOMY  04/06/04   APPENDECTOMY     BILATERAL SALPINGOOPHORECTOMY  04/06/04   CARDIAC EVENT MONITOR  02/2016   Mostly sinus bradycardia and sinus rhythm.  Rare PVCs and PACs.  No arrhythmias.  Symptoms of chest pain and fluttering  as well as "passed out spell" noted with normal sinus rhythm.   MASS EXCISION Left 06/02/2015   Procedure: EXCISION LEFT ARM MASS;  Surgeon: Chevis Pretty III, MD;  Location: [facility];  Service: General;  Laterality: Left;   NM MYOVIEW LTD  04/2016   Reached heart rate of 127 bpm with Lexiscan.  EF 60 to 65%.  LOW RISK.  No ischemia or infarction.   ROTATOR CUFF REPAIR     TRANSTHORACIC ECHOCARDIOGRAM  01/2016   F 55-6%.  No R WMA.  GR 1 DD.  Normal valves.    OB History   No obstetric history on file.       Home Medications    Prior to Admission medications   Medication Sig Start Date End Date Taking? Authorizing Provider  predniSONE (STERAPRED UNI-PAK 21 TAB) 10 MG (21) TBPK tablet Take by mouth daily. Take 6 tabs by mouth daily  for 1 days, then 5 tabs for 1 days, then 4 tabs for 1 days, then 3 tabs for 1 days, 2 tabs for 1 days, then 1 tab by mouth daily for 1 days 08/17/22  Yes Barbette Merino, NP  promethazine-dextromethorphan (PROMETHAZINE-DM) 6.25-15 MG/5ML syrup Take 5 mLs by mouth 4 (four) times daily as needed for cough. 08/17/22  Yes Carolyn Maniscalco, Shana Chute, NP  amLODipine (NORVASC) 5 MG tablet TAKE 1 TABLET (5 MG TOTAL) BY MOUTH DAILY. 05/09/22   Bradd Canary, MD  ascorbic acid (VITAMIN C) 100 MG tablet Take 1 tablet by mouth daily.    [provider]  aspirin 81 MG chewable tablet Chew 81 mg by mouth daily.    [provider]  atorvastatin (LIPITOR) 10 MG tablet TAKE 1 TABLET BY MOUTH EVERY DAY 01/25/22   Bradd Canary, MD  butalbital-acetaminophen-caffeine (FIORICET) [id number] MG tablet TAKE 1 TABLET BY MOUTH TWICE A DAY 07/11/21   Bradd Canary, MD  citalopram (CELEXA) 40 MG tablet Take 1 tablet by mouth daily.    [provider]  clorazepate (TRANXENE) 3.75 MG tablet Take by mouth.    [provider]  esomeprazole (NEXIUM) 40 MG capsule Take 1 capsule (40 mg total) by mouth daily. Use as needed for gerd 05/11/21   Copland, Gwenlyn Found, MD  hydrOXYzine (ATARAX) 10 MG tablet Take by mouth.    [provider]  levETIRAcetam (KEPPRA) 500 MG tablet Take 500 mg by mouth 2 (two) times daily.    [provider]  meloxicam (MOBIC) 15 MG tablet TAKE 1 TABLET (15 MG TOTAL) BY MOUTH DAILY. 08/07/21   Bradd Canary, MD  metoprolol succinate (TOPROL XL) 50 MG 24 hr tablet Take 100mg  (2 tablets) in the AM and 75mg  (1.5 tablets) in the PM 01/31/22   Tobb, Kardie, DO  omeprazole (PRILOSEC) 20 MG capsule Take by mouth.    [provider]   oxyCODONE-acetaminophen (PERCOCET/ROXICET) 5-325 MG tablet Take 1-2 tablets by mouth every 6 (six) hours as needed for severe pain. 07/13/21   Lorre Nick, MD  polyethylene glycol powder [facility]) 17 GM/SCOOP powder Take by mouth. 07/28/21   [provider]  tiZANidine (ZANAFLEX) 2 MG tablet Take 1 tablet (2 mg total) by mouth every 6 (six) hours as needed. 05/11/21   Copland, Gwenlyn Found, MD    Family History Family History  Problem Relation Age of Onset   Heart disease Mother    Heart attack Mother    Cancer Father        colon  Hypertension Father    Cancer Sister        brain   Hypertension Sister    Cancer Sister        unsure of type   Diabetes Sister        type 2   Heart attack Sister    Cancer Brother    Hypertension Brother    Colon cancer Neg Hx    Esophageal cancer Neg Hx    Pancreatic cancer Neg Hx    Stomach cancer Neg Hx     Social History Social History   Tobacco Use   Smoking status: Every Day    Packs/day: 0.50    Years: 40.00    Additional pack years: 0.00    Total pack years: 20.00    Types: Cigarettes    Start date: 12/18/2015   Smokeless tobacco: Never   Tobacco comments:    3-4 cigarettes daily  Vaping Use   Vaping Use: Never used  Substance Use Topics   Alcohol use: Yes    Comment: wine occassioanlly    Drug use: No     Allergies   Patient has no known allergies.   Review of Systems Review of Systems   Physical Exam Triage Vital Signs ED Triage Vitals  Enc Vitals Group     BP 08/17/22 0932 (!) 161/78     Pulse Rate 08/17/22 0928 89     Resp 08/17/22 0928 20     Temp 08/17/22 0928 98.7 F (37.1 C)     Temp Source 08/17/22 0928 Oral     SpO2 08/17/22 0928 97 %     Weight --      Height --      Head Circumference --      Peak Flow --      Pain Score 08/17/22 0931 5     Pain Loc --      Pain Edu? --      Excl. in GC? --    No data found.  Updated Vital Signs BP (!) 161/78 (BP Location: Left Arm)    Pulse 89   Temp 98.7 F (37.1 C) (Oral)   Resp 20   LMP  (LMP Unknown)   SpO2 99%   Visual Acuity Right Eye Distance:   Left Eye Distance:   Bilateral Distance:    Right Eye Near:   Left Eye Near:    Bilateral Near:     Physical Exam HENT:     Head: Normocephalic and atraumatic.     Mouth/Throat:     Mouth: Mucous membranes are moist.     Pharynx: Oropharyngeal exudate and posterior oropharyngeal erythema present.  Cardiovascular:     Rate and Rhythm: Normal rate and regular rhythm.     Pulses: Normal pulses.     Heart sounds: Normal heart sounds.  Pulmonary:     Breath sounds: No wheezing or rhonchi.     Comments: Coughing during exam  Musculoskeletal:        General: Normal range of motion.     Cervical back: Normal range of motion.  Skin:    General: Skin is warm and dry.     Capillary Refill: Capillary refill takes less than 2 seconds.     Comments: Per pts baseline  Neurological:     General: No focal deficit present.     Mental Status: She is alert and oriented to person, place, and time.  Psychiatric:        Mood  and Affect: Mood normal.      UC Treatments / Results  Labs (all labs ordered are listed, but only abnormal results are displayed) Labs Reviewed  SARS CORONAVIRUS 2 (TAT 6-24 HRS)  CBC WITH DIFFERENTIAL/PLATELET    EKG   Radiology DG Chest 2 View  Result Date: 08/17/2022 CLINICAL DATA:  Productive cough, shortness of breath. EXAM: CHEST - 2 VIEW COMPARISON:  March 25, 2020. FINDINGS: The heart size and mediastinal contours are within normal limits. Hyperexpansion of the lungs is noted. No consolidative process is noted. New small nodular density is noted in right lung apex. The visualized skeletal structures are unremarkable. IMPRESSION: Hyperexpansion of the lungs. No consolidative process. New small nodular density seen in right lung apex; CT scan of the chest is recommended to rule out pulmonary nodule. Electronically Signed   By: Lupita Raider M.D.   On: 08/17/2022 10:43    Procedures Procedures (including critical care time)  Medications Ordered in UC Medications  ipratropium-albuterol (DUONEB) 0.5-2.5 (3) MG/3ML nebulizer solution 3 mL (3 mLs Nebulization Given 08/17/22 1000)    Initial Impression / Assessment and Plan / UC Course  I have reviewed the triage vital signs and the nursing notes.  Pertinent labs & imaging results that were available during my care of the patient were reviewed by me and considered in my medical decision making (see chart for details).     Persistent cough Final Clinical Impressions(s) / UC Diagnoses   Final diagnoses:  Persistent cough for 3 weeks or longer  SOB (shortness of breath)     Discharge Instructions      You have been given a DuoNeb nebulizer which was effective for your shortness of breath.    Overall your x-ray is negative for any acute abnormalities. Hyperexpansion of the lungs. No consolidative process. New small nodular density seen in right lung apex; CT scan of the chest is recommended to rule out pulmonary nodule. This is the nodule that you had in 2017. However you should follow up with PCP and have the CT scan completed.   You have been prescribed  prednisone Dosepak.  This will help with your breathing.  You have also been prescribed promethazine/dextromethorphan.  5 mL every 4 hours as needed for cough.  This does have some nausea medication to assist you with the nausea. We do encourage you to follow up with your primary care provider if your symptoms persist for further evaluation .  We do recommend that you use over the counter Tylenol or Ibuprofen as directed for pain. (do not exceed daily limits).   Your COVID is pending. Your results will show in your MyChart. Any positive results will result in a phone call from a nurse with next steps in treatment and recommendations.         ED Prescriptions     Medication Sig Dispense Auth. Provider    promethazine-dextromethorphan (PROMETHAZINE-DM) 6.25-15 MG/5ML syrup Take 5 mLs by mouth 4 (four) times daily as needed for cough. 240 mL Thad Ranger M, NP   predniSONE (STERAPRED UNI-PAK 21 TAB) 10 MG (21) TBPK tablet Take by mouth daily. Take 6 tabs by mouth daily  for 1 days, then 5 tabs for 1 days, then 4 tabs for 1 days, then 3 tabs for 1 days, 2 tabs for 1 days, then 1 tab by mouth daily for 1 days 2,111 tablet Barbette Merino, NP      PDMP not reviewed this encounter.   Brooke Dare,  Shana Chute, NP 08/17/22 1109

## 2022-08-17 NOTE — Discharge Instructions (Addendum)
You have been given a DuoNeb nebulizer which was effective for your shortness of breath.    Overall your x-ray is negative for any acute abnormalities. Hyperexpansion of the lungs. No consolidative process. New small nodular density seen in right lung apex; CT scan of the chest is recommended to rule out pulmonary nodule. This is the nodule that you had in 2017. However you should follow up with PCP and have the CT scan completed.   You have been prescribed  prednisone Dosepak.  This will help with your breathing.  You have also been prescribed promethazine/dextromethorphan.  5 mL every 4 hours as needed for cough.  This does have some nausea medication to assist you with the nausea. We do encourage you to follow up with your primary care provider if your symptoms persist for further evaluation .  We do recommend that you use over the counter Tylenol or Ibuprofen as directed for pain. (do not exceed daily limits).   Your COVID is pending. Your results will show in your MyChart. Any positive results will result in a phone call from a nurse with next steps in treatment and recommendations.

## 2022-08-18 LAB — CBC WITH DIFFERENTIAL/PLATELET
Basophils Absolute: 0.1 10*3/uL (ref 0.0–0.2)
Basos: 0 %
EOS (ABSOLUTE): 0.1 10*3/uL (ref 0.0–0.4)
Eos: 1 %
Hematocrit: 42.1 % (ref 34.0–46.6)
Hemoglobin: 13.6 g/dL (ref 11.1–15.9)
Immature Grans (Abs): 0 10*3/uL (ref 0.0–0.1)
Immature Granulocytes: 0 %
Lymphocytes Absolute: 2.5 10*3/uL (ref 0.7–3.1)
Lymphs: 22 %
MCH: 27.8 pg (ref 26.6–33.0)
MCHC: 32.3 g/dL (ref 31.5–35.7)
MCV: 86 fL (ref 79–97)
Monocytes Absolute: 0.6 10*3/uL (ref 0.1–0.9)
Monocytes: 5 %
Neutrophils Absolute: 8.1 10*3/uL — ABNORMAL HIGH (ref 1.4–7.0)
Neutrophils: 72 %
Platelets: 348 10*3/uL (ref 150–450)
RBC: 4.89 x10E6/uL (ref 3.77–5.28)
RDW: 14.9 % (ref 11.7–15.4)
WBC: 11.4 10*3/uL — ABNORMAL HIGH (ref 3.4–10.8)

## 2022-08-18 LAB — SARS CORONAVIRUS 2 (TAT 6-24 HRS): SARS Coronavirus 2: NEGATIVE

## 2022-10-01 ENCOUNTER — Encounter: Payer: Self-pay | Admitting: Family Medicine

## 2022-10-01 ENCOUNTER — Ambulatory Visit (INDEPENDENT_AMBULATORY_CARE_PROVIDER_SITE_OTHER): Payer: Medicaid Other | Admitting: Family Medicine

## 2022-10-01 VITALS — BP 169/80 | HR 79 | Temp 98.4°F | Ht 62.0 in | Wt 111.0 lb

## 2022-10-01 DIAGNOSIS — R1310 Dysphagia, unspecified: Secondary | ICD-10-CM

## 2022-10-01 DIAGNOSIS — R918 Other nonspecific abnormal finding of lung field: Secondary | ICD-10-CM

## 2022-10-01 DIAGNOSIS — R09A2 Foreign body sensation, throat: Secondary | ICD-10-CM

## 2022-10-01 DIAGNOSIS — I10 Essential (primary) hypertension: Secondary | ICD-10-CM

## 2022-10-01 DIAGNOSIS — R490 Dysphonia: Secondary | ICD-10-CM

## 2022-10-01 DIAGNOSIS — R300 Dysuria: Secondary | ICD-10-CM | POA: Diagnosis not present

## 2022-10-01 LAB — POC URINALSYSI DIPSTICK (AUTOMATED)
Bilirubin, UA: NEGATIVE
Glucose, UA: NEGATIVE
Ketones, UA: NEGATIVE
Leukocytes, UA: NEGATIVE
Nitrite, UA: NEGATIVE
Protein, UA: NEGATIVE
Spec Grav, UA: <=1.005 — AB (ref 1.010–1.025)
Urobilinogen, UA: 0.2 E.U./dL
pH, UA: 7 (ref 5.0–8.0)

## 2022-10-01 MED ORDER — AMLODIPINE BESYLATE 5 MG PO TABS: 5.0000 mg | ORAL_TABLET | Freq: Every day | ORAL | 1 refills | Status: DC

## 2022-10-01 NOTE — Assessment & Plan Note (Signed)
Complaints of difficulty swallowing and sensation of a lump in the throat. -Advise patient to contact her existing Gastroenterology (GI) provider, Dr. Orvan Falconer, for evaluation of swallowing difficulties.

## 2022-10-01 NOTE — Assessment & Plan Note (Signed)
Urinary Symptoms: Complaints of pelvic discomfort and bladder pressure for two weeks. Urine sample sent to lab for further investigation. -Continue AZO for discomfort. -Advise patient to drink plenty of water. -Review urine culture results when available and manage accordingly. Consider imaging if labs inconclusive and symptoms persist

## 2022-10-01 NOTE — Assessment & Plan Note (Signed)
~  2 months of new hoarseness that is not resolving She does have a history of smoking Referral to ENT Also advise she schedule an appointment with her GI provider

## 2022-10-01 NOTE — Assessment & Plan Note (Signed)
Hypertension: Blood pressure recorded as high during the visit. Patient is currently on Metoprolol and Amlodipine. -Refill Amlodipine prescription. -Schedule a nurse visit in two weeks to recheck blood pressure. -Advise patient to monitor blood pressure at home and record readings.

## 2022-10-01 NOTE — Progress Notes (Signed)
Acute Office Visit  Subjective:     Patient ID: Ruth Bell, female    DOB: 06-02-1960, 62 y.o.   MRN: [id number]  Chief Complaint  Patient presents with   Cough     Patient is in today for cough and urinary frequency.   Discussed the use of AI scribe software for clinical note transcription with the patient, who gave verbal consent to proceed.  History of Present Illness   The patient, with a history of hypertension, presents with a two-week history of pelvic discomfort, bladder pressure, urinary frequency/urgency, and lower back pain. The patient has been experiencing hoarseness and a productive cough for approximately a month and a half. Initially, the cough was productive of green sputum, but it has since changed to a white color. She went to urgent care at the end of May for approximately a month of cough. CXR at that time recommended CT scan, which has not yet been done. She reports she did get some response from the prednisone they gave her, but symptoms have since worsened again. The patient denies noticing any blood in the sputum. They also report a sensation of a lump in their throat, which is particularly noticeable when swallowing. This has led to difficulty eating and a subsequent weight loss of approximately 4-5 pounds over the past couple of months. They have a history of smoking.               All review of systems negative except what is listed in the HPI      Objective:    BP (!) 169/80   Pulse 79   Temp 98.4 F (36.9 C) (Oral)   Ht 5\' 2"  (1.575 m)   Wt 111 lb (50.3 kg)   LMP  (LMP Unknown)   SpO2 97%   BMI 20.30 kg/m    Physical Exam Vitals reviewed.  Constitutional:      Appearance: Normal appearance.  HENT:     Head: Normocephalic and atraumatic.     Comments: Hoarse Cardiovascular:     Rate and Rhythm: Normal rate and regular rhythm.     Heart sounds: Normal heart sounds.  Pulmonary:     Effort: No respiratory distress.     Breath  sounds: Normal breath sounds. No stridor. No wheezing, rhonchi or rales.  Chest:     Chest wall: No tenderness.  Skin:    General: Skin is warm and dry.  Neurological:     Mental Status: She is alert and oriented to person, place, and time.  Psychiatric:        Mood and Affect: Mood normal.        Behavior: Behavior normal.        Thought Content: Thought content normal.        Judgment: Judgment normal.        Results for orders placed or performed in visit on 10/01/22  POCT Urinalysis Dipstick (Automated)  Result Value Ref Range   Color, UA yelllow    Clarity, UA clear    Glucose, UA Negative Negative   Bilirubin, UA negative    Ketones, UA negative    Spec Grav, UA <=1.005 (A) 1.010 - 1.025   Blood, UA small    pH, UA 7.0 5.0 - 8.0   Protein, UA Negative Negative   Urobilinogen, UA 0.2 0.2 or 1.0 E.U./dL   Nitrite, UA negative    Leukocytes, UA Negative Negative        Assessment &  Plan:   Problem List Items Addressed This Visit     Essential hypertension (Chronic)    Hypertension: Blood pressure recorded as high during the visit. Patient is currently on Metoprolol and Amlodipine. -Refill Amlodipine prescription. -Schedule a nurse visit in two weeks to recheck blood pressure. -Advise patient to monitor blood pressure at home and record readings.      Relevant Medications   amLODipine (NORVASC) 5 MG tablet   Dysuria - Primary    Urinary Symptoms: Complaints of pelvic discomfort and bladder pressure for two weeks. Urine sample sent to lab for further investigation. -Continue AZO for discomfort. -Advise patient to drink plenty of water. -Review urine culture results when available and manage accordingly. Consider imaging if labs inconclusive and symptoms persist       Relevant Orders   POCT Urinalysis Dipstick (Automated) (Completed)   Urine Culture   Urine Microscopic Only   Hoarseness    ~2 months of new hoarseness that is not resolving She does have a  history of smoking Referral to ENT Also advise she schedule an appointment with her GI provider      Relevant Orders   Ambulatory referral to ENT   Dysphagia    Complaints of difficulty swallowing and sensation of a lump in the throat. -Advise patient to contact her existing Gastroenterology (GI) provider, Dr. Orvan Falconer, for evaluation of swallowing difficulties.      Other Visit Diagnoses     Globus sensation     ENT referral given associated hoarseness as well Also advised she follow-up with her GI provider    Relevant Orders   Ambulatory referral to ENT   Abnormal findings on diagnostic imaging of lung     Respiratory Symptoms: Persistent hoarseness and productive cough for over 2 months, with initial green sputum now white, thick. History of smoking. Prior chest X-ray showed a nodular density in the right lung apex, with a recommendation for a CT scan. -Order CT scan of chest and labs today -Advise patient to contact the office if symptoms worsen.   Relevant Orders   CT CHEST WO CONTRAST   CBC with Differential/Platelet   Comprehensive metabolic panel       Meds ordered this encounter  Medications   amLODipine (NORVASC) 5 MG tablet    Sig: Take 1 tablet (5 mg total) by mouth daily.    Dispense:  90 tablet    Refill:  1    Order Specific Question:   Supervising Provider    Answer:   Danise Edge A [[id number]]    Return in about 2 weeks (around 10/15/2022) for BP check with nurse.  Clayborne Dana, NP

## 2022-10-01 NOTE — Patient Instructions (Addendum)
Referral to ENT to evaluate the new, ongoing hoarseness Call your GI doctor to further investigate the swallowing difficulties CT scan of your lungs since last xray was abnormal Labs today  Urine unremarkable today - sending off to the lab. Okay to take Azo and be sure to stay hydrated. If symptoms persist, let us know so we can further evaluate as indicated.   Please contact office for follow-up if symptoms do not improve or worsen. Seek emergency care if symptoms become severe.

## 2022-10-02 ENCOUNTER — Encounter: Payer: Self-pay | Admitting: Physician Assistant

## 2022-10-02 LAB — CBC WITH DIFFERENTIAL/PLATELET
Basophils Absolute: 0.1 10*3/uL (ref 0.0–0.1)
Basophils Relative: 1.1 % (ref 0.0–3.0)
Eosinophils Absolute: 0.1 10*3/uL (ref 0.0–0.7)
Eosinophils Relative: 1.8 % (ref 0.0–5.0)
HCT: 40.8 % (ref 36.0–46.0)
Hemoglobin: 13.3 g/dL (ref 12.0–15.0)
Lymphocytes Relative: 39.7 % (ref 12.0–46.0)
Lymphs Abs: 2.9 10*3/uL (ref 0.7–4.0)
MCHC: 32.6 g/dL (ref 30.0–36.0)
MCV: 86.1 fl (ref 78.0–100.0)
Monocytes Absolute: 0.5 10*3/uL (ref 0.1–1.0)
Monocytes Relative: 6.6 % (ref 3.0–12.0)
Neutro Abs: 3.7 10*3/uL (ref 1.4–7.7)
Neutrophils Relative %: 50.8 % (ref 43.0–77.0)
Platelets: 317 10*3/uL (ref 150.0–400.0)
RBC: 4.73 Mil/uL (ref 3.87–5.11)
RDW: 15.1 % (ref 11.5–15.5)
WBC: 7.3 10*3/uL (ref 4.0–10.5)

## 2022-10-02 LAB — COMPREHENSIVE METABOLIC PANEL
ALT: 12 U/L (ref 0–35)
AST: 16 U/L (ref 0–37)
Albumin: 4.6 g/dL (ref 3.5–5.2)
Alkaline Phosphatase: 53 U/L (ref 39–117)
BUN: 13 mg/dL (ref 6–23)
CO2: 30 mEq/L (ref 19–32)
Calcium: 10 mg/dL (ref 8.4–10.5)
Chloride: 103 mEq/L (ref 96–112)
Creatinine, Ser: 0.67 mg/dL (ref 0.40–1.20)
GFR: 92.62 mL/min (ref >60.00)
Glucose, Bld: 94 mg/dL (ref 70–99)
Potassium: 4.3 mEq/L (ref 3.5–5.1)
Sodium: 140 mEq/L (ref 135–145)
Total Bilirubin: 0.5 mg/dL (ref 0.2–1.2)
Total Protein: 7.3 g/dL (ref 6.0–8.3)

## 2022-10-02 LAB — URINALYSIS, MICROSCOPIC ONLY: WBC, UA: NONE SEEN (ref 0–?)

## 2022-10-02 LAB — URINE CULTURE
MICRO NUMBER:: 15200305
Result:: NO GROWTH
SPECIMEN QUALITY:: ADEQUATE

## 2022-10-03 ENCOUNTER — Ambulatory Visit (HOSPITAL_BASED_OUTPATIENT_CLINIC_OR_DEPARTMENT_OTHER): Payer: Medicaid Other

## 2022-10-04 ENCOUNTER — Telehealth: Payer: Self-pay | Admitting: Family Medicine

## 2022-10-04 NOTE — Telephone Encounter (Signed)
Called spoke with pt husband regarding lower back per Ladona Ridgel no UTI and BP is too high for steroids. Pt was advised to try lidocaine patches. If persistent, despite stretching, heat, ice, massage, etc then to let us know we can consider an xray of her low back. Pt husband stated understand.

## 2022-10-04 NOTE — Telephone Encounter (Signed)
Pt's husband states he thought Ruth Bell was sending in prednisone but they have not received anything.  [id number] [place], [place] - [address] [place] [address] Phone: [phone]  Fax: [phone]

## 2022-10-04 NOTE — Telephone Encounter (Signed)
Accidentally routed to PCP.   Called patient and they state that steroid was for back pain and Ladona Ridgel stated in the room that she would renew it.   Ladona Ridgel - please advise.

## 2022-10-04 NOTE — Telephone Encounter (Signed)
Not mentioned in note, please advise?

## 2022-10-04 NOTE — Telephone Encounter (Signed)
We talked about her recently having steroids in May for her respiratory symptoms, but for the back we were ruling out UTI (culture was negative). BP is too high for steroids. She can try lidocaine patches. If persistent, despite stretching, heat, ice, massage, etc then we can consider an xray of her low back.

## 2022-10-09 ENCOUNTER — Other Ambulatory Visit (HOSPITAL_COMMUNITY): Payer: Self-pay | Admitting: *Deleted

## 2022-10-09 DIAGNOSIS — R1310 Dysphagia, unspecified: Secondary | ICD-10-CM

## 2022-10-11 ENCOUNTER — Other Ambulatory Visit: Payer: Self-pay | Admitting: Otolaryngology

## 2022-10-11 DIAGNOSIS — K1120 Sialoadenitis, unspecified: Secondary | ICD-10-CM

## 2022-10-15 NOTE — Progress Notes (Deleted)
Pt here for Blood pressure check per TB 10/01/22: "Return in about 2 weeks (around 10/15/2022) for BP check with nurse."  Hypertension: Blood pressure recorded as high during the visit. Patient is currently on Metoprolol and Amlodipine. -Refill Amlodipine prescription. -Schedule a nurse visit in two weeks to recheck blood pressure. -Advise patient to monitor blood pressure at home and record readings.  Pt currently takes: Metoprolol Succinate 50 mg- 100 am, 75 mg pm Amlodopine 5 mg daily  Pt reports compliance with medication.  BP today @ = HR =  Pt advised per

## 2022-10-16 ENCOUNTER — Ambulatory Visit: Payer: Medicaid Other

## 2022-10-16 DIAGNOSIS — I10 Essential (primary) hypertension: Secondary | ICD-10-CM

## 2022-10-18 ENCOUNTER — Ambulatory Visit (HOSPITAL_COMMUNITY)
Admission: RE | Admit: 2022-10-18 | Discharge: 2022-10-18 | Disposition: A | Discharge status: Home / Self Care | Payer: Medicaid Other | Source: Ambulatory Visit | Attending: Family Medicine | Admitting: Family Medicine

## 2022-10-18 DIAGNOSIS — K219 Gastro-esophageal reflux disease without esophagitis: Secondary | ICD-10-CM | POA: Insufficient documentation

## 2022-10-18 DIAGNOSIS — R1310 Dysphagia, unspecified: Secondary | ICD-10-CM | POA: Insufficient documentation

## 2022-10-18 DIAGNOSIS — R059 Cough, unspecified: Secondary | ICD-10-CM | POA: Insufficient documentation

## 2022-10-18 NOTE — Progress Notes (Signed)
Modified Barium Swallow Study  Patient Details  Name: ENRIKA KATTNER MRN: [id number] Date of Birth: 23-May-1960  Today's Date: 10/18/2022  Modified Barium Swallow completed.  Full report located under Chart Review in the Imaging Section.  History of Present Illness 62 yr old seen for outpatient MBS accompanied by her husband. She states she coughs "all day" and when she eats/drinks she feels like she is "choking" and coughs, pharyngeal globus sensation, coughing up phlegm and mucous, vocal hoarseness for 2 months and weight loss. She states she eats normal foods and liquids but has to take small bites and chew well.   Clinical Impression Pt's presentation appears similar to MBS in 2018 where she coughed, gagged and expectorated signifcantly during today's study. From a physiological standpoint her oropharyngeal strength, timing and range of motion is within functional limits. Oral containment, control, transit and mastication all normal. Anterior hyoid excursion, laryngeal elevation, epiglottic deflection and glottic closure were adequate. There was no laryngeal penetration or aspiration. One instance of vallecular and pyriform sinus residue with thin that she cleared with second swallow. There may be a sensory component as with almost every sip pt would swallow then have an immediate cough response/reflex that led to expectoration of barium and mucous. Pt states this occurs frequently at home. Barium liquids/solid entered esophagus without difficulty and esophageal scan did not reveal stasis. Pt states she cannot lie down at night to sleep because she gets choked and expectorates phlegm throghout the day as well as noteable vocal hoarseness. She may have a compenent of esophageal dysphagia. Reviewed results with pt and spouse and offered the following recommendations: continue regular texture foods she is able to eat, thin liquids, continue to crush pills, drink warm liquids versus cold and speak to MD  about possibility of re-initiating meds for reflux (was on meds sometime prior). Explained and provided handout on cough suppression strategies and suggested possibly contacting outpatient therapy to see if they offer cough suppression intervention. Factors that may increase risk of adverse event in presence of aspiration Rubye Oaks & Clearance Coots 2021):    Swallow Evaluation Recommendations Recommendations: PO diet PO Diet Recommendation: Regular;Thin liquids (Level 0) Liquid Administration via: Straw;Cup Medication Administration: Crushed with puree (takes crushed at home) Supervision: Patient able to self-feed Swallowing strategies  : Slow rate;Small bites/sips Postural changes: Stay upright 30-60 min after meals;Position pt fully upright for meals Oral care recommendations: Oral care BID (2x/day)      Royce Macadamia 10/18/2022,12:09 PM

## 2022-10-23 ENCOUNTER — Other Ambulatory Visit: Payer: Medicaid Other

## 2022-10-29 ENCOUNTER — Telehealth: Payer: Self-pay | Admitting: Family Medicine

## 2022-10-29 NOTE — Telephone Encounter (Signed)
Medication: meloxicam (MOBIC) 15 MG tablet   Has the patient contacted their pharmacy? No.   Preferred Pharmacy:  [id number] [place], [place] - [address] [place] [address] Phone: [phone]  Fax: [phone]

## 2022-10-30 ENCOUNTER — Other Ambulatory Visit: Payer: Self-pay

## 2022-10-30 DIAGNOSIS — R52 Pain, unspecified: Secondary | ICD-10-CM

## 2022-10-30 MED ORDER — MELOXICAM 15 MG PO TABS
15.0000 mg | ORAL_TABLET | Freq: Every day | ORAL | 1 refills | Status: DC
Start: 2022-10-30 — End: 2024-01-06

## 2022-10-30 NOTE — Telephone Encounter (Signed)
Refill sent.

## 2022-11-12 ENCOUNTER — Ambulatory Visit
Admission: RE | Admit: 2022-11-12 | Discharge: 2022-11-12 | Disposition: A | Discharge status: Home / Self Care | Payer: Medicaid Other | Source: Ambulatory Visit | Attending: Otolaryngology | Admitting: Otolaryngology

## 2022-11-12 ENCOUNTER — Ambulatory Visit
Admission: RE | Admit: 2022-11-12 | Discharge: 2022-11-12 | Disposition: A | Discharge status: Home / Self Care | Payer: Medicaid Other | Source: Ambulatory Visit | Attending: Family Medicine | Admitting: Family Medicine

## 2022-11-12 ENCOUNTER — Institutional Professional Consult (permissible substitution) (INDEPENDENT_AMBULATORY_CARE_PROVIDER_SITE_OTHER): Payer: Medicaid Other | Admitting: Otolaryngology

## 2022-11-12 DIAGNOSIS — K1120 Sialoadenitis, unspecified: Secondary | ICD-10-CM

## 2022-11-12 MED ORDER — IOPAMIDOL (ISOVUE-300) INJECTION 61%
500.0000 mL | Freq: Once | INTRAVENOUS | Status: AC | PRN
  Administered 2022-11-12: 75 mL via INTRAVENOUS

## 2022-11-13 ENCOUNTER — Ambulatory Visit (INDEPENDENT_AMBULATORY_CARE_PROVIDER_SITE_OTHER): Payer: Medicaid Other | Admitting: Otolaryngology

## 2022-11-13 ENCOUNTER — Encounter: Payer: Self-pay | Admitting: Family Medicine

## 2022-11-13 ENCOUNTER — Encounter (INDEPENDENT_AMBULATORY_CARE_PROVIDER_SITE_OTHER): Payer: Self-pay | Admitting: Otolaryngology

## 2022-11-13 VITALS — BP 109/20 | HR 65 | Ht 62.0 in | Wt 109.2 lb

## 2022-11-13 DIAGNOSIS — J343 Hypertrophy of nasal turbinates: Secondary | ICD-10-CM

## 2022-11-13 DIAGNOSIS — R053 Chronic cough: Secondary | ICD-10-CM | POA: Diagnosis not present

## 2022-11-13 DIAGNOSIS — Z9109 Other allergy status, other than to drugs and biological substances: Secondary | ICD-10-CM

## 2022-11-13 DIAGNOSIS — R0981 Nasal congestion: Secondary | ICD-10-CM | POA: Diagnosis not present

## 2022-11-13 DIAGNOSIS — J3489 Other specified disorders of nose and nasal sinuses: Secondary | ICD-10-CM

## 2022-11-13 DIAGNOSIS — I700 Atherosclerosis of aorta: Secondary | ICD-10-CM | POA: Insufficient documentation

## 2022-11-13 DIAGNOSIS — R490 Dysphonia: Secondary | ICD-10-CM

## 2022-11-13 DIAGNOSIS — R0982 Postnasal drip: Secondary | ICD-10-CM

## 2022-11-13 DIAGNOSIS — Z87891 Personal history of nicotine dependence: Secondary | ICD-10-CM

## 2022-11-13 MED ORDER — FLUTICASONE PROPIONATE 50 MCG/ACT NA SUSP: 2.0000 | Freq: Every day | NASAL | 6 refills | Status: DC

## 2022-11-13 MED ORDER — DESLORATADINE 5 MG PO TABS: 5.0000 mg | ORAL_TABLET | Freq: Every day | ORAL | 3 refills | Status: DC

## 2022-11-13 MED ORDER — FAMOTIDINE 20 MG PO TABS: 20.0000 mg | ORAL_TABLET | Freq: Two times a day (BID) | ORAL | 1 refills | Status: DC

## 2022-11-13 NOTE — Patient Instructions (Addendum)
-   see Pulmonary specialist for evaluation of productive cough  - we will do flexible scope exam and videostrobe when you return  - try Reflux Gourmet and wean off Pantoprazole, start Famotidine  - see speech therapy for cough and dysphonia/presbyphonia  - start Flonase and allergy pill (Clarinex)  - Take Reflux Gourmet (natural supplement available on [employer]) to help with symptoms of chronic throat irritation

## 2022-11-13 NOTE — Progress Notes (Signed)
ENT CONSULT:  Reason for Consult: dysphonia and trouble swallowing since 2020   HPI: Ruth Bell is an 62 y.o. female current smoker x 2 yrs (2-3 cigarettes a day with meals), here for dysphonia and severe raspiness as well of choking episodes chronic productive cough.  She recently saw another ENT provider Dr. Ernestene Kiel, will order modified barium swallow study and CT neck due to longstanding history of voice changes and trouble with swallowing.  She reports experiencing symptoms of choking and persistent phlegm and chronic cough worse in the morning and around meals since pandemic in 2020.  She denies history of COVID infection when symptoms started. She coughs up dark phlegm every morning.  She also reports that she recently picked up smoking habits and smokes for relaxation around meals.  She has history of GERD and was previously on pantoprazole.  Not on medications or nasal sprays for postnasal drainage currently.  She had a CT chest ordered by primary care provider following development of her symptoms and evidence of pulmonary nodule on chest x-ray.  She was recommended for cough retraining therapy by speech after having modified barium swallow study.  Record review indicates that flexible laryngoscopy with Dr. Ernestene Kiel revealed vocal fold atrophy and she was sent to [name] for evaluation.  No prior evaluations by pulmonary or bronchoscopy based on patient report or record review.  She is tolerating regular diet and denies any recent weight changes.  Denies active heartburn symptoms. She was also seen at the urgent care for persistent chronic cough and wheezing and was given Dosepak prescribed promethazine dextromethorphan for her cough.  She felt it helped her at the time of her taking the medications.      Records Reviewed:  Urgent care note 08/17/22   She is in today complaining of a 5-week history of productive cough. She reports that she was told by her primary care provider to come in today for  evaluation. She is complaining of sore throat along with shortness of breath with occasional wheezing. She is also complaining of pain in her middle back. She is also having hoarseness. She denies a history of hoarseness. She feels like she phlegm makes it difficult for her to rest at night. She feels like she is choking. She also has a history of dysphagia in her chart. She denies any chest pain. Denies any recent exposures.  She was also seen at the urgent care for persistent chronic cough and wheezing and was given Dosepak prescribed promethazine dextromethorphan for her cough.  She felt it helped her at the time of her taking the medications.   DG Chest 2 View   Result Date: 08/17/2022 CLINICAL DATA:  Productive cough, shortness of breath. EXAM: CHEST - 2 VIEW COMPARISON:  March 25, 2020. FINDINGS: The heart size and mediastinal contours are within normal limits. Hyperexpansion of the lungs is noted. No consolidative process is noted. New small nodular density is noted in right lung apex. The visualized skeletal structures are unremarkable. IMPRESSION: Hyperexpansion of the lungs. No consolidative process. New small nodular density seen in right lung apex; CT scan of the chest is recommended to rule out pulmonary nodule. Electronically Signed   By: Lupita Raider M.D.   On: 08/17/2022 10:43     Note by Dr Ernestene Kiel 10/05/22 Dx with choking sensation and dysphagia/dysphonia -sent for MBS and neck CT  "Ruth Bell is a 62 y.o. year old female with history of daily tobacco abuse who presents today for 2 month  history of severe hoarseness, choking, dysphagia for solids, liquids, and pills, left neck tenderness. Some shortness of breath. Nothing seems to make this better or worse. Denies heartburn. She takes a PPI. Accompanied by her husband. Social alcohol intake."  IMPRESSION: 62 y/o F with chronic tobacco abuse who presents with 2 months of severe dysphonia, dysphagia, choking episodes. Laryngoscopy  demonstrates vocal cord atrophy, no masses or lesions. Unclear why she has had a significant exacerbation of laryngeal symptoms. I've recommend urgent laryngology evaluation, MBS , and CT Neck (in setting of neck pain out of proportion to exam findings).  1. Dysphonia  2. Presbylarynx  3. Pharyngoesophageal dysphagia  4. Choking, initial encounter  5. Tobacco abuse  6. Submandibular sialoadenitis    SLP note MBS exam 10/18/2022 HPI: 62 yr old seen for outpatient MBS accompanied by her husband. She states she coughs "all day" and when she eats/drinks she feels like she is "choking" and coughs, pharyngeal globus sensation, coughing up phlegm and mucous, vocal hoarseness for 2 months and weight loss. She states she eats normal foods and liquids but has to take small bites and chew well.      Clinical Impression: Clinical Impression: Pt's presentation appears similar to MBS in 2018 where she coughed, gagged and expectorated signifcantly during today's study. From a physiological standpoint her oropharyngeal strength, timing and range of motion is within functional limits. Oral containment, control, transit and mastication all normal. Anterior hyoid excursion, laryngeal elevation, epiglottic deflection and glottic closure were adequate. There was no laryngeal penetration or aspiration. One instance of vallecular and pyriform sinus residue with thin that she cleared with second swallow. There may be a sensory component as with almost every sip pt would swallow then have an immediate cough response/reflex that led to expectoration of barium and mucous. Pt states this occurs frequently at home. Barium liquids/solid entered esophagus without difficulty and esophageal scan did not reveal stasis. Pt states she cannot lie down at night to sleep because she gets choked and expectorates phlegm throghout the day as well as noteable vocal hoarseness. She may have a compenent of esophageal dysphagia. Reviewed results  with pt and spouse and offered the following recommendations: continue regular texture foods she is able to eat, thin liquids, continue to crush pills, drink warm liquids versus cold and speak to MD about possibility of re-initiating meds for reflux (was on meds sometime prior). Explained and provided handout on cough suppression strategies and suggested possibly contacting outpatient therapy to see if they offer cough suppression intervention.   Factors that may increase risk of adverse event in presence of aspiration Rubye Oaks & Clearance Coots 2021): No data recorded   Recommendations/Plan: Swallowing Evaluation Recommendations Swallowing Evaluation Recommendations Recommendations: PO diet PO Diet Recommendation: Regular; Thin liquids (Level 0) Liquid Administration via: Straw; Cup Medication Administration: Crushed with puree (takes crushed at home) Supervision: Patient able to self-feed Swallowing strategies  : Slow rate; Small bites/sips Postural changes: Stay upright 30-60 min after meals; Position pt fully upright for meals Oral care recommendations: Oral care BID (2x/day)       Treatment Plan Treatment Plan Treatment recommendations: Other (comment) (possible outpatient ST for cough suppression) Follow-up recommendations: Outpatient SLP (possible for cough suppression intervention) Functional status assessment: Patient has not had a recent decline in their functional status.    Past Medical History:  Diagnosis Date   Anemia    Anxiety    Arm skin lesion, left 09/05/2014   Constipation 12/27/2013   Depression    Dysphagia  04/15/2016   Eczema 08/22/2015   GERD (gastroesophageal reflux disease)    Hematuria 03/04/2016   History of shingles 01/22/2016   Hyperglycemia 01/15/2015   Hyperlipidemia, mixed 08/22/2015   Hypertension    Menometrorrhagia 2006   Migraine, unspecified, without mention of intractable migraine without mention of status migrainosus    Preventative health care  11/04/2016   Pseudoseizures    Seizures (HCC)    history of pseudoseizure disorder, last last seizure 3 wks, keppra inc in dosage   Tubular adenoma of colon 05/2011   Uterine leiomyoma 2006    Past Surgical History:  Procedure Laterality Date   ABDOMINAL HYSTERECTOMY  04/06/04   APPENDECTOMY     BILATERAL SALPINGOOPHORECTOMY  04/06/04   CARDIAC EVENT MONITOR  02/2016   Mostly sinus bradycardia and sinus rhythm.  Rare PVCs and PACs.  No arrhythmias.  Symptoms of chest pain and fluttering as well as "passed out spell" noted with normal sinus rhythm.   MASS EXCISION Left 06/02/2015   Procedure: EXCISION LEFT ARM MASS;  Surgeon: Chevis Pretty III, MD;  Location: [facility];  Service: General;  Laterality: Left;   NM MYOVIEW LTD  04/2016   Reached heart rate of 127 bpm with Lexiscan.  EF 60 to 65%.  LOW RISK.  No ischemia or infarction.   ROTATOR CUFF REPAIR     TRANSTHORACIC ECHOCARDIOGRAM  01/2016   F 55-6%.  No R WMA.  GR 1 DD.  Normal valves.    Family History  Problem Relation Age of Onset   Heart disease Mother    Heart attack Mother    Cancer Father        colon   Hypertension Father    Cancer Sister        brain   Hypertension Sister    Cancer Sister        unsure of type   Diabetes Sister        type 2   Heart attack Sister    Cancer Brother    Hypertension Brother    Colon cancer Neg Hx    Esophageal cancer Neg Hx    Pancreatic cancer Neg Hx    Stomach cancer Neg Hx     Social History:  reports that she has been smoking cigarettes. She started smoking about 6 years ago. She has a 20 pack-year smoking history. She has never used smokeless tobacco. She reports current alcohol use. She reports that she does not use drugs.  Allergies: No Known Allergies  Medications: I have reviewed the patient's current medications.  The PMH, PSH, Medications, Allergies, and SH were reviewed and updated.  ROS: Constitutional: Negative for fever, weight loss and weight  gain. Cardiovascular: Negative for chest pain and dyspnea on exertion. Respiratory: Is not experiencing shortness of breath at rest. Gastrointestinal: Negative for nausea and vomiting. Neurological: Negative for headaches. Psychiatric: The patient is not nervous/anxious  Blood pressure (!) 109/20, pulse 65, height 5\' 2"  (1.575 m), weight 109 lb 3.2 oz (49.5 kg), SpO2 100%.  PHYSICAL EXAM:  Exam: General: Well-developed, well-nourished Communication and Voice: Raspy and low pitch near whisper Respiratory Respiratory effort: Equal inspiration and expiration without stridor Cardiovascular Peripheral Vascular: Warm extremities with equal color/perfusion Eyes: No nystagmus with equal extraocular motion bilaterally Neuro/Psych/Balance: Patient oriented to person, place, and time; Appropriate mood and affect; Gait is intact with no imbalance; Cranial nerves I-XII are intact Head and Face Inspection: Normocephalic and atraumatic without mass or lesion Palpation: Facial skeleton  intact without bony stepoffs Salivary Glands: No mass or tenderness Facial Strength: Facial motility symmetric and full bilaterally ENT Pinna: External ear intact and fully developed External canal: Canal is patent with intact skin Tympanic Membrane: Clear and mobile External Nose: No scar or anatomic deformity Internal Nose: Septum is deviated to the left. No polyp, or purulence. Mucosal edema and erythema present.  Bilateral inferior turbinate hypertrophy.  Lips, Teeth, and gums: Mucosa and teeth intact and viable TMJ: No pain to palpation with full mobility Oral cavity/oropharynx: No erythema or exudate, no lesions present Neck Neck and Trachea: Midline trachea without mass or lesion Thyroid: No mass or nodularity Lymphatics: No lymphadenopathy  Procedure:   PROCEDURE NOTE: nasal endoscopy  Preoperative diagnosis: chronic cough/concern for nasal drainage   Postoperative diagnosis: same  Procedure:  Diagnostic nasal endoscopy (50932)  Procedure findings: septal deviation with S-shaped septum worse on the left side, inferior turbinate hypertrophy and mucosal edema, narrowing b/l nasal passages L > R mucosal edema and secretions no purulence or polyps  Surgeon: Ashok Croon, M.D.  Anesthesia: Topical lidocaine and Afrin  H&P REVIEW: The patient's history and physical were reviewed today prior to procedure. All medications were reviewed and updated as well. Complications: None Condition is stable throughout exam Indications and consent: The patient presents with symptoms of chronic sinusitis not responding to previous therapies. All the risks, benefits, and potential complications were reviewed with the patient preoperatively and informed consent was obtained. The time out was completed with confirmation of the correct procedure.   Procedure: The patient was seated upright in the clinic. Topical lidocaine and Afrin were applied to the nasal cavity. After adequate anesthesia had occurred, the rigid nasal endoscope was passed into the nasal cavity. The nasal mucosa, turbinates, septum, and sinus drainage pathways were visualized bilaterally. This revealed no purulence or significant secretions that might be cultured. There were no polyps or sites of significant inflammation. The mucosa was intact and there was no crusting present. The scope was then slowly withdrawn and the patient tolerated the procedure well. There were no complications or blood loss.   Videostrobe - attempted to scope today with intent to do videostrobe, but patient did not tolerate - will defer until her f/u appt with [name]     Studies Reviewed: Home sleep study 07/2021 - she had AHI < 15 and I advised her that she is not a candidate for [facility] based on her recent sleep study results   CT chest and neck - only chest CT interpreted - I reviewed CT neck - no evidence of paranasal sinus  opacification  FINDINGS: Cardiovascular: Unenhanced imaging of the heart is unremarkable without pericardial effusion. Normal caliber of the thoracic aorta. Atherosclerosis of the aorta and coronary vasculature. Evaluation of the vascular lumen is limited without IV contrast.   Mediastinum/Nodes: No enlarged mediastinal or axillary lymph nodes. Thyroid gland, trachea, and esophagus demonstrate no significant findings.   Lungs/Pleura: There is chronic subpleural scarring at the right apex, not appreciably changed since 20/11 and consistent with benign etiology. Stable 4 mm right upper lobe pulmonary nodule reference image 41/5, also benign given long-term stability. This may correspond to the chest x-ray finding.   No acute airspace disease, effusion, or pneumothorax. Upper lobe predominant centrilobular emphysema. Minimal bibasilar scarring or subsegmental atelectasis. The central airways are patent.   Upper Abdomen: No acute abnormality.   Musculoskeletal: No acute or destructive bony abnormalities. Reconstructed images demonstrate no additional findings.   IMPRESSION: 1. Stable 4 mm  right upper lobe pulmonary nodule, benign given long-term stability since 2011. This likely accounts for chest x-ray finding. Chronic subpleural scarring at the right apex also benign given lack of change since 2011. No further imaging follow-up is recommended. 2. No acute intrathoracic process. 3. Aortic Atherosclerosis (ICD10-I70.0) and Emphysema (ICD10-J43.9).    Assessment/Plan: Encounter Diagnoses  Name Primary?   Chronic cough Yes   Dysphonia    Nasal congestion [R09.81]    Nasal obstruction [J34.89]    Hypertrophy of inferior nasal turbinate [J34.3]    Environmental allergies [Z91.09]    Hx of smoking [Z87.891]    Post-nasal drainage [R55.27]    62 year old female current smoker although reports 2-year history of smoking 3 to 4 cigarettes around meals, who is here for dysphonia  choking sensation dysphagia and productive cough present for several months since the pandemic in 2020.  Recently seen by Dr. Ernestene Kiel who scoped the patient and found no evidence of masses or tumors on flexible laryngoscopy per record review and she was ordered to have CT neck and modified barium swallow study.  I reviewed the images of CT neck and there is no evidence of chronic sinus inflammation or obvious tumors or lesions although final radiology report is still pending.  She also had a recent CT neck which showed stable scarring at the right lung apex without changes since 2011 and stable pulmonary nodules in the right upper lobe.  She describes productive cough with brown sputum worse in the morning.  No prior pulmonary evaluation.  We were able to complete nasal endoscopy today, but unfortunately she did not tolerate video stroboscopy, and we will defer video stroboscopy for her next office visit.  This was due to persistent cough symptoms during exam and nasal irritation.  She was somewhat tender at that thyrohyoid ligament bilaterally worse on the left but palpation of that area did not trigger cough.  Nasal endoscopy did reveal mucosal edema and clear postnasal drainage but no purulence or polyps. Ddx for chronic cough includes pulmonary process versus postnasal drainage versus untreated GERD LPR versus multifactorial.  I advised the patient to wean off PPI and start famotidine, and add reflux Gourmet to her reflux regimen.  Based on modified barium swallow report there was a recommendation to do cough suppressant therapy since it did not demonstrate aspiration or penetration and her oropharyngeal swallow appeared normal, this was apparently consistent with her prior MBS done before several years ago.  Will initiate Flonase and antihistamine to help with postnasal drainage, although I do not believe this is the primary cause of her symptoms.  Will attempt to perform videostrobe when she returns to rule out  muscle tension dysphonia versus primarily presbyphonia, although both could contribute to her voice changes.  Will attempt speech therapy for cough and presbyphonia to see if that provides any improvement in her symptoms.   - see Pulmonary specialist for evaluation of productive cough  - we will do flexible scope exam and videostrobe when you return  - try Reflux Gourmet and wean off Pantoprazole, start Famotidine  - see speech therapy for cough and dysphonia/presbyphonia  - we will try to do videostrobe when you return - start Flonase and allergy pill (Clarinex)  Thank you for allowing me to participate in the care of this patient. Please do not hesitate to contact me with any questions or concerns.   Ashok Croon, MD Otolaryngology [facility] Phone: [phone] Fax: [phone]    11/13/2022, 4:06 PM

## 2022-11-16 ENCOUNTER — Encounter: Payer: Self-pay | Admitting: Neurology

## 2022-11-27 ENCOUNTER — Other Ambulatory Visit (INDEPENDENT_AMBULATORY_CARE_PROVIDER_SITE_OTHER): Payer: Self-pay | Admitting: Otolaryngology

## 2022-12-20 ENCOUNTER — Ambulatory Visit (INDEPENDENT_AMBULATORY_CARE_PROVIDER_SITE_OTHER): Payer: Medicaid Other | Admitting: Otolaryngology

## 2023-06-10 ENCOUNTER — Other Ambulatory Visit (INDEPENDENT_AMBULATORY_CARE_PROVIDER_SITE_OTHER): Payer: Self-pay | Admitting: Otolaryngology

## 2023-10-09 NOTE — Assessment & Plan Note (Signed)
 hgba1c acceptable, minimize simple carbs. Increase exercise as tolerated. hgba1c acceptable, minimize simple carbs. Increase exercise as tolerated.

## 2023-10-09 NOTE — Assessment & Plan Note (Signed)
 Well controlled, no changes to meds. Encouraged heart healthy diet such as the DASH diet and exercise as tolerated.

## 2023-10-09 NOTE — Assessment & Plan Note (Signed)
 Hydrate and monitor

## 2023-10-09 NOTE — Assessment & Plan Note (Signed)
 Encourage heart healthy diet such as MIND or DASH diet, increase exercise, avoid trans fats, simple carbohydrates and processed foods, consider a krill or fish or flaxseed oil cap daily.

## 2023-10-10 ENCOUNTER — Ambulatory Visit: Payer: Self-pay | Admitting: Family Medicine

## 2023-10-10 ENCOUNTER — Encounter: Payer: Self-pay | Admitting: Family Medicine

## 2023-10-10 VITALS — BP 158/96 | HR 74 | Resp 16 | Ht 62.0 in | Wt 101.2 lb

## 2023-10-10 DIAGNOSIS — G4700 Insomnia, unspecified: Secondary | ICD-10-CM

## 2023-10-10 DIAGNOSIS — I10 Essential (primary) hypertension: Secondary | ICD-10-CM | POA: Diagnosis not present

## 2023-10-10 DIAGNOSIS — F32A Depression, unspecified: Secondary | ICD-10-CM

## 2023-10-10 DIAGNOSIS — Z1231 Encounter for screening mammogram for malignant neoplasm of breast: Secondary | ICD-10-CM

## 2023-10-10 DIAGNOSIS — M109 Gout, unspecified: Secondary | ICD-10-CM | POA: Diagnosis not present

## 2023-10-10 DIAGNOSIS — Z780 Asymptomatic menopausal state: Secondary | ICD-10-CM | POA: Diagnosis not present

## 2023-10-10 DIAGNOSIS — F4310 Post-traumatic stress disorder, unspecified: Secondary | ICD-10-CM

## 2023-10-10 DIAGNOSIS — R739 Hyperglycemia, unspecified: Secondary | ICD-10-CM

## 2023-10-10 DIAGNOSIS — Z1211 Encounter for screening for malignant neoplasm of colon: Secondary | ICD-10-CM

## 2023-10-10 DIAGNOSIS — F419 Anxiety disorder, unspecified: Secondary | ICD-10-CM

## 2023-10-10 DIAGNOSIS — E782 Mixed hyperlipidemia: Secondary | ICD-10-CM | POA: Diagnosis not present

## 2023-10-10 DIAGNOSIS — R32 Unspecified urinary incontinence: Secondary | ICD-10-CM

## 2023-10-10 DIAGNOSIS — E2839 Other primary ovarian failure: Secondary | ICD-10-CM

## 2023-10-10 LAB — CBC WITH DIFFERENTIAL/PLATELET
Basophils Absolute: 0.1 K/uL (ref 0.0–0.1)
Basophils Relative: 1 % (ref 0.0–3.0)
Eosinophils Absolute: 0.1 K/uL (ref 0.0–0.7)
Eosinophils Relative: 0.8 % (ref 0.0–5.0)
HCT: 43.4 % (ref 36.0–46.0)
Hemoglobin: 14.1 g/dL (ref 12.0–15.0)
Lymphocytes Relative: 27.3 % (ref 12.0–46.0)
Lymphs Abs: 2.1 K/uL (ref 0.7–4.0)
MCHC: 32.4 g/dL (ref 30.0–36.0)
MCV: 84.8 fl (ref 78.0–100.0)
Monocytes Absolute: 0.4 K/uL (ref 0.1–1.0)
Monocytes Relative: 4.8 % (ref 3.0–12.0)
Neutro Abs: 5.1 K/uL (ref 1.4–7.7)
Neutrophils Relative %: 66.1 % (ref 43.0–77.0)
Platelets: 304 K/uL (ref 150.0–400.0)
RBC: 5.12 Mil/uL — ABNORMAL HIGH (ref 3.87–5.11)
RDW: 14.8 % (ref 11.5–15.5)
WBC: 7.8 K/uL (ref 4.0–10.5)

## 2023-10-10 LAB — LIPID PANEL
Cholesterol: 208 mg/dL — ABNORMAL HIGH (ref 0–200)
HDL: 86.2 mg/dL (ref >39.00)
LDL Cholesterol: 109 mg/dL — ABNORMAL HIGH (ref 0–99)
NonHDL: 122.24
Total CHOL/HDL Ratio: 2
Triglycerides: 66 mg/dL (ref 0.0–149.0)
VLDL: 13.2 mg/dL (ref 0.0–40.0)

## 2023-10-10 LAB — TSH: TSH: 1.33 u[IU]/mL (ref 0.35–5.50)

## 2023-10-10 LAB — COMPREHENSIVE METABOLIC PANEL WITH GFR
ALT: 14 U/L (ref 0–35)
AST: 19 U/L (ref 0–37)
Albumin: 4.8 g/dL (ref 3.5–5.2)
Alkaline Phosphatase: 64 U/L (ref 39–117)
BUN: 9 mg/dL (ref 6–23)
CO2: 28 meq/L (ref 19–32)
Calcium: 9.8 mg/dL (ref 8.4–10.5)
Chloride: 102 meq/L (ref 96–112)
Creatinine, Ser: 0.56 mg/dL (ref 0.40–1.20)
GFR: 96.02 mL/min (ref >60.00)
Glucose, Bld: 88 mg/dL (ref 70–99)
Potassium: 4.7 meq/L (ref 3.5–5.1)
Sodium: 141 meq/L (ref 135–145)
Total Bilirubin: 0.5 mg/dL (ref 0.2–1.2)
Total Protein: 8.1 g/dL (ref 6.0–8.3)

## 2023-10-10 LAB — HEMOGLOBIN A1C: Hgb A1c MFr Bld: 6.1 % (ref 4.6–6.5)

## 2023-10-10 LAB — URIC ACID: Uric Acid, Serum: 4.2 mg/dL (ref 2.4–7.0)

## 2023-10-10 MED ORDER — METOPROLOL SUCCINATE ER 100 MG PO TB24
100.0000 mg | ORAL_TABLET | Freq: Two times a day (BID) | ORAL | 1 refills | Status: DC
Start: 2023-10-10 — End: 2024-01-30

## 2023-10-10 MED ORDER — HYOSCYAMINE SULFATE 0.125 MG PO TBDP: 0.1250 mg | ORAL_TABLET | ORAL | 1 refills | Status: AC | PRN

## 2023-10-10 NOTE — Patient Instructions (Signed)
 [facility] (insomnia APP [facility])Insomnia Insomnia is a sleep disorder that makes it difficult to fall asleep or stay asleep. Insomnia can cause fatigue, low energy, difficulty concentrating, mood swings, and poor performance at work or school. There are three different ways to classify insomnia: Difficulty falling asleep. Difficulty staying asleep. Waking up too early in the morning. Any type of insomnia can be long-term (chronic) or short-term (acute). Both are common. Short-term insomnia usually lasts for 3 months or less. Chronic insomnia occurs at least three times a week for longer than 3 months. What are the causes? Insomnia may be caused by another condition, situation, or substance, such as: Having certain mental health conditions, such as anxiety and depression. Using caffeine , alcohol, tobacco, or drugs. Having gastrointestinal conditions, such as gastroesophageal reflux disease (GERD). Having certain medical conditions. These include: Asthma. Alzheimer's disease. Stroke. Chronic pain. An overactive thyroid  gland (hyperthyroidism). Other sleep disorders, such as restless legs syndrome and sleep apnea. Menopause. Sometimes, the cause of insomnia may not be known. What increases the risk? Risk factors for insomnia include: Gender. Females are affected more often than males. Age. Insomnia is more common as people get older. Stress and certain medical and mental health conditions. Lack of exercise. Having an irregular work schedule. This may include working night shifts and traveling between different time zones. What are the signs or symptoms? If you have insomnia, the main symptom is having trouble falling asleep or having trouble staying asleep. This may lead to other symptoms, such as: Feeling tired or having low energy. Feeling nervous about going to sleep. Not feeling rested in the morning. Having trouble concentrating. Feeling irritable, anxious, or depressed. How is this  diagnosed? This condition may be diagnosed based on: Your symptoms and medical history. Your health care provider may ask about: Your sleep habits. Any medical conditions you have. Your mental health. A physical exam. How is this treated? Treatment for insomnia depends on the cause. Treatment may focus on treating an underlying condition that is causing the insomnia. Treatment may also include: Medicines to help you sleep. Counseling or therapy. Lifestyle adjustments to help you sleep better. Follow these instructions at home: Eating and drinking  Limit or avoid alcohol, caffeinated beverages, and products that contain nicotine  and tobacco, especially close to bedtime. These can disrupt your sleep. Do not eat a large meal or eat spicy foods right before bedtime. This can lead to digestive discomfort that can make it hard for you to sleep. Sleep habits  Keep a sleep diary to help you and your health care provider figure out what could be causing your insomnia. Write down: When you sleep. When you wake up during the night. How well you sleep and how rested you feel the next day. Any side effects of medicines you are taking. What you eat and drink. Make your bedroom a dark, comfortable place where it is easy to fall asleep. Put up shades or blackout curtains to block light from outside. Use a white noise machine to block noise. Keep the temperature cool. Limit screen use before bedtime. This includes: Not watching TV. Not using your smartphone, tablet, or computer. Stick to a routine that includes going to bed and waking up at the same times every day and night. This can help you fall asleep faster. Consider making a quiet activity, such as reading, part of your nighttime routine. Try to avoid taking naps during the day so that you sleep better at night. Get out of bed if you  are still awake after 15 minutes of trying to sleep. Keep the lights down, but try reading or doing a quiet  activity. When you feel sleepy, go back to bed. General instructions Take over-the-counter and prescription medicines only as told by your health care provider. Exercise regularly as told by your health care provider. However, avoid exercising in the hours right before bedtime. Use relaxation techniques to manage stress. Ask your health care provider to suggest some techniques that may work well for you. These may include: Breathing exercises. Routines to release muscle tension. Visualizing peaceful scenes. Make sure that you drive carefully. Do not drive if you feel very sleepy. Keep all follow-up visits. This is important. Contact a health care provider if: You are tired throughout the day. You have trouble in your daily routine due to sleepiness. You continue to have sleep problems, or your sleep problems get worse. Get help right away if: You have thoughts about hurting yourself or someone else. Get help right away if you feel like you may hurt yourself or others, or have thoughts about taking your own life. Go to your nearest emergency room or: Call 911. Call the National Suicide Prevention Lifeline at 819-069-8832 or 988. This is open 24 hours a day. Text the Crisis Text Line at 3231303921. Summary Insomnia is a sleep disorder that makes it difficult to fall asleep or stay asleep. Insomnia can be long-term (chronic) or short-term (acute). Treatment for insomnia depends on the cause. Treatment may focus on treating an underlying condition that is causing the insomnia. Keep a sleep diary to help you and your health care provider figure out what could be causing your insomnia. This information is not intended to replace advice given to you by your health care provider. Make sure you discuss any questions you have with your health care provider. Document Revised: 02/13/2021 Document Reviewed: 02/13/2021 Elsevier Patient Education  2024 [employer].

## 2023-10-12 LAB — URINE CULTURE
MICRO NUMBER:: 16741135
Result:: NO GROWTH
SPECIMEN QUALITY:: ADEQUATE

## 2023-10-12 LAB — URINALYSIS, COMPLETE
Bacteria, UA: NONE SEEN /HPF
Bilirubin Urine: NEGATIVE
Glucose, UA: NEGATIVE
Hyaline Cast: NONE SEEN /LPF
Ketones, ur: NEGATIVE
Leukocytes,Ua: NEGATIVE
Nitrite: NEGATIVE
Protein, ur: NEGATIVE
Specific Gravity, Urine: 1.009 (ref 1.001–1.035)
Squamous Epithelial / HPF: NONE SEEN /HPF (ref <=5)
WBC, UA: NONE SEEN /HPF (ref 0–5)
pH: 7.5 (ref 5.0–8.0)

## 2023-10-13 ENCOUNTER — Encounter: Payer: Self-pay | Admitting: Family Medicine
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
## 2023-11-19 ENCOUNTER — Inpatient Hospital Stay (HOSPITAL_BASED_OUTPATIENT_CLINIC_OR_DEPARTMENT_OTHER): Admission: RE | Admit: 2023-11-19 | Source: Ambulatory Visit

## 2023-11-19 ENCOUNTER — Other Ambulatory Visit (HOSPITAL_BASED_OUTPATIENT_CLINIC_OR_DEPARTMENT_OTHER)

## 2023-11-27 ENCOUNTER — Encounter: Payer: Self-pay | Admitting: Internal Medicine

## 2023-12-10 ENCOUNTER — Encounter: Payer: Self-pay | Admitting: Internal Medicine

## 2023-12-10 ENCOUNTER — Other Ambulatory Visit: Payer: Self-pay

## 2023-12-10 ENCOUNTER — Ambulatory Visit (AMBULATORY_SURGERY_CENTER)

## 2023-12-10 VITALS — Ht 62.0 in | Wt 115.0 lb

## 2023-12-10 DIAGNOSIS — Z860100 Personal history of colon polyps, unspecified: Secondary | ICD-10-CM

## 2023-12-10 MED ORDER — NA SULFATE-K SULFATE-MG SULF 17.5-3.13-1.6 GM/177ML PO SOLN
1.0000 | Freq: Once | ORAL | 0 refills | Status: AC
Start: 2023-12-10 — End: 2023-12-10

## 2023-12-10 NOTE — Progress Notes (Signed)
 Denies allergies to eggs or soy products. Denies complication of anesthesia or sedation. Denies use of weight loss medication. Denies use of O2.   Emmi instructions given for colonoscopy.

## 2023-12-24 ENCOUNTER — Encounter: Payer: Self-pay | Admitting: Internal Medicine

## 2023-12-24 ENCOUNTER — Ambulatory Visit (AMBULATORY_SURGERY_CENTER): Admitting: Internal Medicine

## 2023-12-24 VITALS — BP 149/79 | HR 72 | Temp 97.3°F | Resp 11 | Ht 62.0 in | Wt 115.0 lb

## 2023-12-24 DIAGNOSIS — D123 Benign neoplasm of transverse colon: Secondary | ICD-10-CM | POA: Diagnosis not present

## 2023-12-24 DIAGNOSIS — Z860101 Personal history of adenomatous and serrated colon polyps: Secondary | ICD-10-CM | POA: Diagnosis not present

## 2023-12-24 DIAGNOSIS — D125 Benign neoplasm of sigmoid colon: Secondary | ICD-10-CM | POA: Diagnosis not present

## 2023-12-24 DIAGNOSIS — Z1211 Encounter for screening for malignant neoplasm of colon: Secondary | ICD-10-CM | POA: Diagnosis not present

## 2023-12-24 DIAGNOSIS — I10 Essential (primary) hypertension: Secondary | ICD-10-CM | POA: Diagnosis not present

## 2023-12-24 DIAGNOSIS — D122 Benign neoplasm of ascending colon: Secondary | ICD-10-CM

## 2023-12-24 DIAGNOSIS — Z860100 Personal history of colon polyps, unspecified: Secondary | ICD-10-CM

## 2023-12-24 MED ORDER — SODIUM CHLORIDE 0.9 % IV SOLN
500.0000 mL | Freq: Once | INTRAVENOUS | Status: DC
Start: 2023-12-24 — End: 2023-12-24

## 2023-12-24 NOTE — Patient Instructions (Addendum)
 Repeat colonoscopy in 3 years for surveillance. Continue present medications. Await pathology results.  Please read over handouts provided  YOU HAD AN ENDOSCOPIC PROCEDURE TODAY AT THE [facility]:   Refer to the procedure report that was given to you for any specific questions about what was found during the examination.  If the procedure report does not answer your questions, please call your gastroenterologist to clarify.  If you requested that your care partner not be given the details of your procedure findings, then the procedure report has been included in a sealed envelope for you to review at your convenience later.  YOU SHOULD EXPECT: Some feelings of bloating in the abdomen. Passage of more gas than usual.  Walking can help get rid of the air that was put into your GI tract during the procedure and reduce the bloating. If you had a lower endoscopy (such as a colonoscopy or flexible sigmoidoscopy) you may notice spotting of blood in your stool or on the toilet paper. If you underwent a bowel prep for your procedure, you may not have a normal bowel movement for a few days.  Please Note:  You might notice some irritation and congestion in your nose or some drainage.  This is from the oxygen used during your procedure.  There is no need for concern and it should clear up in a day or so.  SYMPTOMS TO REPORT IMMEDIATELY:  Following lower endoscopy (colonoscopy or flexible sigmoidoscopy):  Excessive amounts of blood in the stool  Significant tenderness or worsening of abdominal pains  Swelling of the abdomen that is new, acute  Fever of 100F or higher  For urgent or emergent issues, a gastroenterologist can be reached at any hour by calling [phone]. Do not use MyChart messaging for urgent concerns.    DIET:  We do recommend a small meal at first, but then you may proceed to your regular diet.  Drink plenty of fluids but you should avoid alcoholic beverages for 24  hours.  ACTIVITY:  You should plan to take it easy for the rest of today and you should NOT DRIVE or use heavy machinery until tomorrow (because of the sedation medicines used during the test).    FOLLOW UP: Our staff will call the number listed on your records the next business day following your procedure.  We will call around 7:15- 8:00 am to check on you and address any questions or concerns that you may have regarding the information given to you following your procedure. If we do not reach you, we will leave a message.     If any biopsies were taken you will be contacted by phone or by letter within the next 1-3 weeks.  Please call us  at [phone] if you have not heard about the biopsies in 3 weeks.    SIGNATURES/CONFIDENTIALITY: You and/or your care partner have signed paperwork which will be entered into your electronic medical record.  These signatures attest to the fact that that the information above on your After Visit Summary has been reviewed and is understood.  Full responsibility of the confidentiality of this discharge information lies with you and/or your care-partner.

## 2023-12-24 NOTE — Op Note (Signed)
 [facility] Patient Name: Ruth Bell Procedure Date: 12/24/2023 8:15 AM MRN: [id number] Endoscopist: Norleen SAILOR. Abran , MD, [id number] Age: 63 Referring MD:  Date of Birth: [birth year] Gender: Female Account #: [id number] Procedure:                Colonoscopy with cold snare polypectomy x 4; biopsy                            x 1; subinjection Indications:              High risk colon cancer surveillance: Personal                            history of multiple (3 or more) adenomas. Previous                            examinations 2005, 2007, 2013, 2014, 2017, 2022                            (Dr. Eda). Medicines:                Monitored Anesthesia Care Procedure:                Pre-Anesthesia Assessment:                           - Prior to the procedure, a History and Physical                            was performed, and patient medications and                            allergies were reviewed. The patient's tolerance of                            previous anesthesia was also reviewed. The risks                            and benefits of the procedure and the sedation                            options and risks were discussed with the patient.                            All questions were answered, and informed consent                            was obtained. Prior Anticoagulants: The patient has                            taken no anticoagulant or antiplatelet agents. ASA                            Grade Assessment: II - A patient with mild systemic  disease. After reviewing the risks and benefits,                            the patient was deemed in satisfactory condition to                            undergo the procedure.                           After obtaining informed consent, the colonoscope                            was passed under direct vision. Throughout the                            procedure, the patient's blood pressure,  pulse, and                            oxygen saturations were monitored continuously. The                            Olympus CF-HQ190L ([id number]) Colonoscope was                            introduced through the anus and advanced to the the                            cecum, identified by appendiceal orifice and                            ileocecal valve. The ileocecal valve, appendiceal                            orifice, and rectum were photographed. The quality                            of the bowel preparation was excellent. The                            colonoscopy was performed without difficulty. The                            patient tolerated the procedure well. The bowel                            preparation used was SUPREP via split dose                            instruction. Scope In: 8:42:35 AM Scope Out: 9:02:38 AM Scope Withdrawal Time: 0 hours 16 minutes 33 seconds  Total Procedure Duration: 0 hours 20 minutes 3 seconds  Findings:                 Four polyps were found in the sigmoid colon,  transverse colon and ascending colon. The polyps                            were 2 to 5 mm in size. These polyps were removed                            with a cold snare. Resection and retrieval were                            complete. One of the polyps, located in the                            transverse colon, measured 5 mm and had a central                            umbilication. This was completely resected. A                            marking tattoo was placed immediately downstream                            from the resection site. NOTE: Further downstream                            (toward the rectum) they remotely placed tattoo was                            noted.                           A less than 1 mm polyp was found in the ascending                            colon. The polyp was removed with a jumbo cold                             forceps. Resection and retrieval were complete.                           The exam was otherwise without abnormality on                            direct and retroflexion views. Complications:            No immediate complications. Estimated blood loss:                            None. Estimated Blood Loss:     Estimated blood loss: none. Impression:               - Four 2 to 5 mm polyps in the sigmoid colon, in                            the transverse colon and  in the ascending colon,                            removed with a cold snare. Resected and retrieved.                           - The 5 mm umbilicated transverse colon polyp was                            resected with adjacent tattoo just downstream as                            reported above.                           - One less than 1 mm polyp in the ascending colon,                            removed with a jumbo cold forceps. Resected and                            retrieved.                           - The examination was otherwise normal on direct                            and retroflexion views. Recommendation:           - Repeat colonoscopy in 3 years for surveillance.                           - Patient has a contact number available for                            emergencies. The signs and symptoms of potential                            delayed complications were discussed with the                            patient. Return to normal activities tomorrow.                            Written discharge instructions were provided to the                            patient.                           - Resume previous diet.                           - Continue present medications.                           -  Await pathology results. Norleen SAILOR. Abran, MD 12/24/2023 9:20:10 AM This report has been signed electronically.

## 2023-12-24 NOTE — Progress Notes (Signed)
 Called to room to assist during endoscopic procedure.  Patient ID and intended procedure confirmed with present staff. Received instructions for my participation in the procedure from the performing physician.

## 2023-12-24 NOTE — Progress Notes (Signed)
 Vss nad trans to pacu

## 2023-12-24 NOTE — Progress Notes (Signed)
 HISTORY OF PRESENT ILLNESS:  Ruth Bell is a 63 y.o. female with a history of multiple adenomatous colon polyps and multiple prior colonoscopies.  Now for surveillance colonoscopy.  REVIEW OF SYSTEMS:  All non-GI ROS negative except for  Past Medical History:  Diagnosis Date   Anemia    Anxiety    Arm skin lesion, left 09/05/2014   Constipation 12/27/2013   Depression    Dysphagia 04/15/2016   Eczema 08/22/2015   GERD (gastroesophageal reflux disease)    Hematuria 03/04/2016   History of shingles 01/22/2016   Hyperglycemia 01/15/2015   Hyperlipidemia, mixed 08/22/2015   Hypertension    Menometrorrhagia 2006   Migraine, unspecified, without mention of intractable migraine without mention of status migrainosus    Preventative health care 11/04/2016   Pseudoseizures    Seizures (HCC)    history of pseudoseizure disorder, last last seizure 3 wks, keppra  inc in dosage   Tubular adenoma of colon 05/2011   Uterine leiomyoma 2006    Past Surgical History:  Procedure Laterality Date   ABDOMINAL HYSTERECTOMY  04/06/04   APPENDECTOMY     BILATERAL SALPINGOOPHORECTOMY  04/06/04   CARDIAC EVENT MONITOR  02/2016   Mostly sinus bradycardia and sinus rhythm.  Rare PVCs and PACs.  No arrhythmias.  Symptoms of chest pain and fluttering as well as passed out spell noted with normal sinus rhythm.   MASS EXCISION Left 06/02/2015   Procedure: EXCISION LEFT ARM MASS;  Surgeon: Deward Null III, MD;  Location: [facility];  Service: General;  Laterality: Left;   NM MYOVIEW  LTD  04/2016   Reached heart rate of 127 bpm with Lexiscan .  EF 60 to 65%.  LOW RISK.  No ischemia or infarction.   ROTATOR CUFF REPAIR     TRANSTHORACIC ECHOCARDIOGRAM  01/2016   F 55-6%.  No R WMA.  GR 1 DD.  Normal valves.    Social History Ruth Bell  reports that she has been smoking cigarettes. She started smoking about 8 years ago. She has a 20.1 pack-year smoking history. She has never used smokeless  tobacco. She reports current alcohol use. She reports that she does not use drugs.  family history includes Cancer in her brother, father, sister, and sister; Diabetes in her sister; Heart attack in her mother and sister; Heart disease in her mother; Hypertension in her brother, father, and sister.  No Known Allergies     PHYSICAL EXAMINATION: Vital signs: BP (!) 179/99   Pulse 71   Temp (!) 97.3 F (36.3 C) (Skin)   Resp 10   Ht 5' 2 (1.575 m)   Wt 115 lb (52.2 kg)   LMP  (LMP Unknown)   SpO2 100%   BMI 21.03 kg/m  General: Well-developed, well-nourished, no acute distress HEENT: Sclerae are anicteric, conjunctiva pink. Oral mucosa intact Lungs: Clear Heart: Regular Abdomen: soft, nontender, nondistended, no obvious ascites, no peritoneal signs, normal bowel sounds. No organomegaly. Extremities: No edema Psychiatric: alert and oriented x3. Cooperative     ASSESSMENT:  History of multiple adenomatous colon polyps   PLAN:  Surveillance colonoscopy

## 2023-12-25 ENCOUNTER — Telehealth: Payer: Self-pay

## 2023-12-25 NOTE — Telephone Encounter (Signed)
  Follow up Call-     12/24/2023    7:22 AM  Call back number  Post procedure Call Back phone  # [phone]  Permission to leave phone message Yes     Patient questions:  Do you have a fever, pain , or abdominal swelling? Yes- Back pain Pain Score  9 *  Have you tolerated food without any problems? Yes.    Have you been able to return to your normal activities? No.  Do you have any questions about your discharge instructions: Diet   No. Medications  No. Follow up visit  No.  Do you have questions or concerns about your Care? Yes.   Patient reports back pain 9 out of 10. States it woke her up at 0300 today. Denies previous back pain or fever. Reports she is passing gas. Denies any other issue. Please advise.  Actions: * If pain score is 4 or above: Physician/ provider Notified : Norleen Kiang, MD.

## 2023-12-25 NOTE — Telephone Encounter (Signed)
 Ruth Bell, Not sure why the patient is having such back pain. Given the degree of pain that she reports, she should go to the emergency room for thorough evaluation. Thanks, Dr. Abran

## 2023-12-25 NOTE — Telephone Encounter (Signed)
 Called pt.  She states that her pain is still a 9/10 and she has not been able to rest since 3:00 am.  She states that it is in her lower back.  She has been able to pass gas last night and this morning.  Advised her that per Dr. Abran, she should go to the ER to be evaluated for this pain given it's severity.  Pt verbalized understanding.

## 2023-12-26 ENCOUNTER — Ambulatory Visit: Payer: Self-pay | Admitting: Internal Medicine

## 2023-12-26 LAB — SURGICAL PATHOLOGY

## 2024-01-06 ENCOUNTER — Inpatient Hospital Stay (HOSPITAL_COMMUNITY)
Admission: EM | Admit: 2024-01-06 | Discharge: 2024-01-10 | DRG: 191 | Disposition: A | Discharge status: Home / Self Care | Attending: Internal Medicine | Admitting: Internal Medicine

## 2024-01-06 ENCOUNTER — Emergency Department (HOSPITAL_COMMUNITY)

## 2024-01-06 ENCOUNTER — Other Ambulatory Visit: Payer: Self-pay

## 2024-01-06 ENCOUNTER — Encounter (HOSPITAL_COMMUNITY): Payer: Self-pay

## 2024-01-06 DIAGNOSIS — F419 Anxiety disorder, unspecified: Secondary | ICD-10-CM | POA: Diagnosis present

## 2024-01-06 DIAGNOSIS — R0602 Shortness of breath: Secondary | ICD-10-CM | POA: Diagnosis not present

## 2024-01-06 DIAGNOSIS — F320 Major depressive disorder, single episode, mild: Secondary | ICD-10-CM

## 2024-01-06 DIAGNOSIS — R739 Hyperglycemia, unspecified: Secondary | ICD-10-CM | POA: Diagnosis present

## 2024-01-06 DIAGNOSIS — R490 Dysphonia: Secondary | ICD-10-CM | POA: Diagnosis present

## 2024-01-06 DIAGNOSIS — E782 Mixed hyperlipidemia: Secondary | ICD-10-CM | POA: Diagnosis present

## 2024-01-06 DIAGNOSIS — Z681 Body mass index (BMI) 19 or less, adult: Secondary | ICD-10-CM | POA: Diagnosis not present

## 2024-01-06 DIAGNOSIS — R636 Underweight: Secondary | ICD-10-CM | POA: Diagnosis present

## 2024-01-06 DIAGNOSIS — J441 Chronic obstructive pulmonary disease with (acute) exacerbation: Secondary | ICD-10-CM | POA: Diagnosis not present

## 2024-01-06 DIAGNOSIS — I10 Essential (primary) hypertension: Secondary | ICD-10-CM | POA: Diagnosis not present

## 2024-01-06 DIAGNOSIS — K219 Gastro-esophageal reflux disease without esophagitis: Secondary | ICD-10-CM | POA: Diagnosis present

## 2024-01-06 DIAGNOSIS — Z7982 Long term (current) use of aspirin: Secondary | ICD-10-CM | POA: Diagnosis not present

## 2024-01-06 DIAGNOSIS — Z791 Long term (current) use of non-steroidal anti-inflammatories (NSAID): Secondary | ICD-10-CM | POA: Diagnosis not present

## 2024-01-06 DIAGNOSIS — G40909 Epilepsy, unspecified, not intractable, without status epilepticus: Secondary | ICD-10-CM | POA: Diagnosis present

## 2024-01-06 DIAGNOSIS — J449 Chronic obstructive pulmonary disease, unspecified: Secondary | ICD-10-CM | POA: Diagnosis not present

## 2024-01-06 DIAGNOSIS — F32A Depression, unspecified: Secondary | ICD-10-CM | POA: Diagnosis present

## 2024-01-06 DIAGNOSIS — R911 Solitary pulmonary nodule: Secondary | ICD-10-CM | POA: Diagnosis not present

## 2024-01-06 DIAGNOSIS — F17210 Nicotine dependence, cigarettes, uncomplicated: Secondary | ICD-10-CM | POA: Diagnosis present

## 2024-01-06 DIAGNOSIS — Z79899 Other long term (current) drug therapy: Secondary | ICD-10-CM | POA: Diagnosis not present

## 2024-01-06 DIAGNOSIS — J208 Acute bronchitis due to other specified organisms: Secondary | ICD-10-CM | POA: Diagnosis not present

## 2024-01-06 DIAGNOSIS — Z8249 Family history of ischemic heart disease and other diseases of the circulatory system: Secondary | ICD-10-CM

## 2024-01-06 DIAGNOSIS — M109 Gout, unspecified: Secondary | ICD-10-CM | POA: Diagnosis present

## 2024-01-06 LAB — CBC
HCT: 48.9 % — ABNORMAL HIGH (ref 36.0–46.0)
Hemoglobin: 16.1 g/dL — ABNORMAL HIGH (ref 12.0–15.0)
MCH: 27.9 pg (ref 26.0–34.0)
MCHC: 32.9 g/dL (ref 30.0–36.0)
MCV: 84.6 fL (ref 80.0–100.0)
Platelets: 358 K/uL (ref 150–400)
RBC: 5.78 MIL/uL — ABNORMAL HIGH (ref 3.87–5.11)
RDW: 14.6 % (ref 11.5–15.5)
WBC: 9.2 K/uL (ref 4.0–10.5)
nRBC: 0 % (ref 0.0–0.2)

## 2024-01-06 LAB — BASIC METABOLIC PANEL WITH GFR
Anion gap: 14 (ref 5–15)
BUN: 8 mg/dL (ref 8–23)
CO2: 25 mmol/L (ref 22–32)
Calcium: 10.3 mg/dL (ref 8.9–10.3)
Chloride: 103 mmol/L (ref 98–111)
Creatinine, Ser: 0.89 mg/dL (ref 0.44–1.00)
GFR, Estimated: >60 mL/min (ref >60)
Glucose, Bld: 93 mg/dL (ref 70–99)
Potassium: 3.8 mmol/L (ref 3.5–5.1)
Sodium: 142 mmol/L (ref 135–145)

## 2024-01-06 LAB — RESPIRATORY PANEL BY PCR

## 2024-01-06 LAB — URINALYSIS, W/ REFLEX TO CULTURE (INFECTION SUSPECTED)
Bilirubin Urine: NEGATIVE
Glucose, UA: NEGATIVE mg/dL
Ketones, ur: NEGATIVE mg/dL
Leukocytes,Ua: NEGATIVE
Nitrite: NEGATIVE
Protein, ur: NEGATIVE mg/dL
Specific Gravity, Urine: 1.016 (ref 1.005–1.030)
pH: 7 (ref 5.0–8.0)

## 2024-01-06 LAB — PROTIME-INR
INR: 1 (ref 0.8–1.2)
Prothrombin Time: 13.3 s (ref 11.4–15.2)

## 2024-01-06 LAB — RESP PANEL BY RT-PCR (RSV, FLU A&B, COVID)  RVPGX2
Influenza A by PCR: NEGATIVE
Influenza B by PCR: NEGATIVE
Resp Syncytial Virus by PCR: NEGATIVE
SARS Coronavirus 2 by RT PCR: NEGATIVE

## 2024-01-06 LAB — PRO BRAIN NATRIURETIC PEPTIDE: Pro Brain Natriuretic Peptide: 87.6 pg/mL (ref <300.0)

## 2024-01-06 LAB — I-STAT CG4 LACTIC ACID, ED: Lactic Acid, Venous: 1.4 mmol/L (ref 0.5–1.9)

## 2024-01-06 MED ORDER — ONDANSETRON HCL 4 MG/2ML IJ SOLN
4.0000 mg | Freq: Once | INTRAMUSCULAR | Status: AC
  Administered 2024-01-06: 4 mg via INTRAVENOUS
  Filled 2024-01-06: qty 2

## 2024-01-06 MED ORDER — IPRATROPIUM-ALBUTEROL 0.5-2.5 (3) MG/3ML IN SOLN
3.0000 mL | Freq: Once | RESPIRATORY_TRACT | Status: AC
  Administered 2024-01-06: 3 mL via RESPIRATORY_TRACT
  Filled 2024-01-06: qty 3

## 2024-01-06 MED ORDER — FAMOTIDINE 20 MG PO TABS
20.0000 mg | ORAL_TABLET | Freq: Two times a day (BID) | ORAL | Status: DC
  Administered 2024-01-06 – 2024-01-10 (×8): 20 mg via ORAL
  Filled 2024-01-06 (×8): qty 1

## 2024-01-06 MED ORDER — ALBUTEROL SULFATE HFA 108 (90 BASE) MCG/ACT IN AERS: 1.0000 | INHALATION_SPRAY | RESPIRATORY_TRACT | 1 refills | Status: DC | PRN

## 2024-01-06 MED ORDER — KETOROLAC TROMETHAMINE 15 MG/ML IJ SOLN: 15.0000 mg | Freq: Once | INTRAMUSCULAR | Status: DC

## 2024-01-06 MED ORDER — IPRATROPIUM-ALBUTEROL 0.5-2.5 (3) MG/3ML IN SOLN
3.0000 mL | Freq: Four times a day (QID) | RESPIRATORY_TRACT | Status: DC
  Administered 2024-01-06 – 2024-01-07 (×4): 3 mL via RESPIRATORY_TRACT
  Filled 2024-01-06 (×4): qty 3

## 2024-01-06 MED ORDER — ALBUTEROL SULFATE (2.5 MG/3ML) 0.083% IN NEBU: 2.5000 mg | INHALATION_SOLUTION | Freq: Four times a day (QID) | RESPIRATORY_TRACT | Status: DC

## 2024-01-06 MED ORDER — ACETAMINOPHEN 650 MG RE SUPP: 650.0000 mg | Freq: Four times a day (QID) | RECTAL | Status: DC | PRN

## 2024-01-06 MED ORDER — ACETAMINOPHEN 325 MG PO TABS
650.0000 mg | ORAL_TABLET | Freq: Four times a day (QID) | ORAL | Status: DC | PRN
  Administered 2024-01-07 – 2024-01-09 (×3): 650 mg via ORAL
  Filled 2024-01-06 (×3): qty 2

## 2024-01-06 MED ORDER — IPRATROPIUM-ALBUTEROL 0.5-2.5 (3) MG/3ML IN SOLN
3.0000 mL | Freq: Once | RESPIRATORY_TRACT | Status: DC
  Filled 2024-01-06: qty 3

## 2024-01-06 MED ORDER — IPRATROPIUM-ALBUTEROL 0.5-2.5 (3) MG/3ML IN SOLN
3.0000 mL | RESPIRATORY_TRACT | Status: AC
  Administered 2024-01-06 (×2): 3 mL via RESPIRATORY_TRACT
  Filled 2024-01-06: qty 6

## 2024-01-06 MED ORDER — ALBUTEROL SULFATE (2.5 MG/3ML) 0.083% IN NEBU: 2.5000 mg | INHALATION_SOLUTION | RESPIRATORY_TRACT | Status: DC | PRN

## 2024-01-06 MED ORDER — MAGNESIUM HYDROXIDE 400 MG/5ML PO SUSP: 30.0000 mL | Freq: Every day | ORAL | Status: DC | PRN

## 2024-01-06 MED ORDER — METOPROLOL SUCCINATE ER 50 MG PO TB24
100.0000 mg | ORAL_TABLET | Freq: Two times a day (BID) | ORAL | Status: DC
  Administered 2024-01-06 – 2024-01-10 (×8): 100 mg via ORAL
  Filled 2024-01-06 (×9): qty 2

## 2024-01-06 MED ORDER — SODIUM CHLORIDE 0.9 % IV SOLN
1.0000 g | INTRAVENOUS | Status: DC
  Administered 2024-01-06 – 2024-01-09 (×4): 1 g via INTRAVENOUS
  Filled 2024-01-06 (×4): qty 10

## 2024-01-06 MED ORDER — KETOROLAC TROMETHAMINE 15 MG/ML IJ SOLN
15.0000 mg | Freq: Once | INTRAMUSCULAR | Status: AC
  Administered 2024-01-06: 15 mg via INTRAVENOUS
  Filled 2024-01-06: qty 1

## 2024-01-06 MED ORDER — GUAIFENESIN ER 600 MG PO TB12
600.0000 mg | ORAL_TABLET | Freq: Two times a day (BID) | ORAL | Status: DC
  Administered 2024-01-06 – 2024-01-10 (×8): 600 mg via ORAL
  Filled 2024-01-06 (×8): qty 1

## 2024-01-06 MED ORDER — LACTATED RINGERS IV BOLUS
1000.0000 mL | Freq: Once | INTRAVENOUS | Status: AC
  Administered 2024-01-06: 1000 mL via INTRAVENOUS

## 2024-01-06 MED ORDER — SODIUM CHLORIDE 0.9% FLUSH
3.0000 mL | Freq: Two times a day (BID) | INTRAVENOUS | Status: DC
  Administered 2024-01-07 – 2024-01-10 (×7): 3 mL via INTRAVENOUS

## 2024-01-06 MED ORDER — LEVETIRACETAM 500 MG PO TABS
500.0000 mg | ORAL_TABLET | Freq: Two times a day (BID) | ORAL | Status: DC
  Administered 2024-01-06 – 2024-01-07 (×3): 500 mg via ORAL
  Filled 2024-01-06 (×3): qty 1

## 2024-01-06 MED ORDER — SODIUM CHLORIDE 0.9 % IV SOLN
500.0000 mg | INTRAVENOUS | Status: DC
  Administered 2024-01-06 – 2024-01-09 (×4): 500 mg via INTRAVENOUS
  Filled 2024-01-06 (×5): qty 5

## 2024-01-06 MED ORDER — PREDNISONE 50 MG PO TABS
50.0000 mg | ORAL_TABLET | Freq: Every day | ORAL | Status: DC
  Administered 2024-01-07: 50 mg via ORAL
  Filled 2024-01-06: qty 1

## 2024-01-06 MED ORDER — AZITHROMYCIN 250 MG PO TABS: 250.0000 mg | ORAL_TABLET | Freq: Every day | ORAL | 0 refills | Status: DC

## 2024-01-06 MED ORDER — METHOCARBAMOL 500 MG PO TABS
500.0000 mg | ORAL_TABLET | Freq: Once | ORAL | Status: AC
  Administered 2024-01-06: 500 mg via ORAL
  Filled 2024-01-06: qty 1

## 2024-01-06 MED ORDER — METHYLPREDNISOLONE SODIUM SUCC 125 MG IJ SOLR
125.0000 mg | Freq: Once | INTRAMUSCULAR | Status: AC
  Administered 2024-01-06: 125 mg via INTRAVENOUS
  Filled 2024-01-06: qty 2

## 2024-01-06 MED ORDER — IPRATROPIUM BROMIDE 0.02 % IN SOLN: 0.5000 mg | Freq: Four times a day (QID) | RESPIRATORY_TRACT | Status: DC

## 2024-01-06 MED ORDER — GUAIFENESIN-CODEINE 100-10 MG/5ML PO SOLN
5.0000 mL | Freq: Four times a day (QID) | ORAL | Status: DC | PRN
Start: 2024-01-06 — End: 2024-01-10

## 2024-01-06 MED ORDER — PREDNISONE 10 MG PO TABS: 40.0000 mg | ORAL_TABLET | Freq: Every day | ORAL | 0 refills | Status: DC

## 2024-01-06 MED ORDER — ENOXAPARIN SODIUM 40 MG/0.4ML IJ SOSY
40.0000 mg | PREFILLED_SYRINGE | INTRAMUSCULAR | Status: DC
  Administered 2024-01-06 – 2024-01-09 (×4): 40 mg via SUBCUTANEOUS
  Filled 2024-01-06 (×4): qty 0.4

## 2024-01-06 MED ORDER — ONDANSETRON HCL 4 MG PO TABS: 4.0000 mg | ORAL_TABLET | Freq: Four times a day (QID) | ORAL | Status: DC | PRN

## 2024-01-06 MED ORDER — ONDANSETRON HCL 4 MG/2ML IJ SOLN: 4.0000 mg | Freq: Four times a day (QID) | INTRAMUSCULAR | Status: DC | PRN

## 2024-01-06 MED ORDER — ACETAMINOPHEN 500 MG PO TABS
1000.0000 mg | ORAL_TABLET | Freq: Once | ORAL | Status: AC
  Administered 2024-01-06: 1000 mg via ORAL
  Filled 2024-01-06: qty 2

## 2024-01-06 NOTE — ED Notes (Signed)
 Gave ice water to pt RN aware

## 2024-01-06 NOTE — ED Notes (Signed)
Pt eating, daughter at bedside

## 2024-01-06 NOTE — ED Notes (Signed)
 Pt ambulatory to and from restroom with steady gait

## 2024-01-06 NOTE — H&P (Addendum)
 History and Physical    Patient: Ruth Bell FMW:[id number] DOB: May 15, 1960 DOA: 01/06/2024 DOS: the patient was seen and examined on 01/06/2024 PCP: Domenica Harlene LABOR, MD  Patient coming from: Home  Chief Complaint:  Chief Complaint  Patient presents with   Shortness of Breath   HPI: Ruth Bell is a 63 y.o. female with medical history significant for seizure disorder, hypertension, hoarseness, tobacco abuse Who presents to the emergency department because she could not breathe.  The patient says for the last 3 days she has had severe cough with wheezing and shortness of breath all day.  This morning she was awakened out of her sleep because she could not breathe.  This has happened in the last 3 mornings.  Her temp this morning was 101 so when she found that out she drove herself to the emergency department for evaluation.  She has been having shaking chills for several days.  Her temperature this morning was 101.  No diarrhea or constipation.  Just difficulty breathing shortness of breath and cough.  Her chest wall is tender because of so much coughing In the emergency department the patient was given IV Solu-Medrol  plus nebs but she her wheezing really did not improve she continued to cough quite a bit she continues to be short of breath and she continues to wheeze therefore she will be admitted to the hospitalist service for continued management.  She was COVID, flu, RSV negative.  Her chest x-ray reveals a chronic unchanged nodule but no evidence of pneumonia.   Review of Systems: As mentioned in the history of present illness. All other systems reviewed and are negative. Past Medical History:  Diagnosis Date   Anemia    Anxiety    Arm skin lesion, left 09/05/2014   Constipation 12/27/2013   Depression    Dysphagia 04/15/2016   Eczema 08/22/2015   GERD (gastroesophageal reflux disease)    Hematuria 03/04/2016   History of shingles 01/22/2016   Hyperglycemia 01/15/2015    Hyperlipidemia, mixed 08/22/2015   Hypertension    Menometrorrhagia 2006   Migraine, unspecified, without mention of intractable migraine without mention of status migrainosus    Preventative health care 11/04/2016   Pseudoseizures    Seizures (HCC)    history of pseudoseizure disorder, last last seizure 3 wks, keppra  inc in dosage   Tubular adenoma of colon 05/2011   Uterine leiomyoma 2006   Past Surgical History:  Procedure Laterality Date   ABDOMINAL HYSTERECTOMY  04/06/04   APPENDECTOMY     BILATERAL SALPINGOOPHORECTOMY  04/06/04   CARDIAC EVENT MONITOR  02/2016   Mostly sinus bradycardia and sinus rhythm.  Rare PVCs and PACs.  No arrhythmias.  Symptoms of chest pain and fluttering as well as passed out spell noted with normal sinus rhythm.   MASS EXCISION Left 06/02/2015   Procedure: EXCISION LEFT ARM MASS;  Surgeon: Deward Null III, MD;  Location: [facility];  Service: General;  Laterality: Left;   NM MYOVIEW  LTD  04/2016   Reached heart rate of 127 bpm with Lexiscan .  EF 60 to 65%.  LOW RISK.  No ischemia or infarction.   ROTATOR CUFF REPAIR     TRANSTHORACIC ECHOCARDIOGRAM  01/2016   F 55-6%.  No R WMA.  GR 1 DD.  Normal valves.   Social History:  reports that she has been smoking cigarettes. She started smoking about 8 years ago. She has a 20.1 pack-year smoking history. She has never used smokeless  tobacco. She reports current alcohol use. She reports that she does not use drugs.  No Known Allergies  Family History  Problem Relation Age of Onset   Heart disease Mother    Heart attack Mother    Cancer Father        colon   Hypertension Father    Cancer Sister        brain   Hypertension Sister    Cancer Sister        unsure of type   Diabetes Sister        type 2   Heart attack Sister    Cancer Brother    Hypertension Brother    Colon cancer Neg Hx    Esophageal cancer Neg Hx    Pancreatic cancer Neg Hx    Stomach cancer Neg Hx    Colon polyps Neg  Hx     Prior to Admission medications   Medication Sig Start Date End Date Taking? Authorizing Provider  acetaminophen  (TYLENOL ) 500 MG tablet Take 1,000 mg by mouth every 6 (six) hours as needed for mild pain (pain score 1-3).   Yes [provider]  albuterol  (VENTOLIN  HFA) 108 (90 Base) MCG/ACT inhaler Inhale 1-2 puffs into the lungs every 4 (four) hours as needed for wheezing or shortness of breath. 01/06/24 02/05/24 Yes Franklyn Sid SAILOR, MD  ascorbic acid (VITAMIN C) 100 MG tablet Take 1 tablet by mouth daily.   Yes [provider]  azithromycin (ZITHROMAX Z-PAK) 250 MG tablet Take 1 tablet (250 mg total) by mouth daily. Please take 2 tablets on the first day and one tablet every day after until gone. 01/06/24  Yes Franklyn Sid SAILOR, MD  desloratadine  (CLARINEX ) 5 MG tablet Take 1 tablet (5 mg total) by mouth daily. 11/13/22  Yes Soldatova, Liuba, MD  famotidine  (PEPCID ) 20 MG tablet TAKE 1 TABLET BY MOUTH TWICE A DAY 06/10/23  Yes Soldatova, Liuba, MD  fluticasone  (FLONASE ) 50 MCG/ACT nasal spray Place 2 sprays into both nostrils daily. 11/13/22  Yes Soldatova, Liuba, MD  hyoscyamine  (ANASPAZ ) 0.125 MG TBDP disintergrating tablet Place 1 tablet (0.125 mg total) under the tongue every 4 (four) hours as needed. 10/10/23  Yes Domenica Harlene LABOR, MD  ibuprofen  (ADVIL ) 200 MG tablet Take 200 mg by mouth every 6 (six) hours as needed for mild pain (pain score 1-3).   Yes [provider]  levETIRAcetam  (KEPPRA ) 500 MG tablet Take 500 mg by mouth 2 (two) times daily.   Yes [provider]  metoprolol  succinate (TOPROL -XL) 100 MG 24 hr tablet Take 1 tablet (100 mg total) by mouth 2 (two) times daily. Take with or immediately following a meal. 10/10/23  Yes Domenica Harlene LABOR, MD  predniSONE  (DELTASONE ) 10 MG tablet Take 4 tablets (40 mg total) by mouth daily for 5 days. 01/06/24 01/11/24 Yes Franklyn Sid SAILOR, MD    Physical Exam: Vitals:   01/06/24 0857 01/06/24 1223 01/06/24  1629  BP: (!) 159/118 (!) 153/115 (!) 183/109  Pulse: 87 94 89  Resp: (!) 22 17 20   Temp: 98.7 F (37.1 C) 98.7 F (37.1 C) 98.5 F (36.9 C)  TempSrc: Oral Oral Oral  SpO2: 100% 100% 98%   Physical Exam:  General: No acute distress, well developed, well nourished HEENT: Normocephalic, atraumatic, PERRL Cardiovascular: Normal rate and rhythm. Distal pulses intact. Pulmonary:frequent cough, tight exp wheezing after coughing Gastrointestinal: Nondistended abdomen, soft, very tender to deep touch below ribs bilaterally, hypoactive bowel sounds Musculoskeletal:Normal ROM,  no lower ext edema Lymphadenopathy: No cervical LAD. Skin: Skin is warm and dry. Neuro: No focal deficits noted, AAOx3. PSYCH: Attentive and cooperative  Data Reviewed:  Results for orders placed or performed during the hospital encounter of 01/06/24 (from the past 24 hours)  Culture, blood (Routine x 2)     Status: None (Preliminary result)   Collection Time: 01/06/24  8:45 AM   Specimen: Peripheral; Blood  Result Value Ref Range   Specimen Description      BLOOD LEFT HAND Performed at [facility], [address] [place], [place] [address]    Special Requests      BOTTLES DRAWN AEROBIC AND ANAEROBIC Blood Culture results may not be optimal due to an inadequate volume of blood received in culture bottles Performed at [facility], [address] [place], [place] [address]    Culture PENDING    Report Status PENDING   Resp panel by RT-PCR (RSV, Flu A&B, Covid) Anterior Nasal Swab     Status: None   Collection Time: 01/06/24  8:52 AM   Specimen: Anterior Nasal Swab  Result Value Ref Range   SARS Coronavirus 2 by RT PCR NEGATIVE NEGATIVE   Influenza A by PCR NEGATIVE NEGATIVE   Influenza B by PCR NEGATIVE NEGATIVE   Resp Syncytial Virus by PCR NEGATIVE NEGATIVE  Basic metabolic panel     Status: None   Collection Time: 01/06/24  8:52 AM  Result Value Ref Range   Sodium 142 135  - 145 mmol/L   Potassium 3.8 3.5 - 5.1 mmol/L   Chloride 103 98 - 111 mmol/L   CO2 25 22 - 32 mmol/L   Glucose, Bld 93 70 - 99 mg/dL   BUN 8 8 - 23 mg/dL   Creatinine, Ser 9.10 0.44 - 1.00 mg/dL   Calcium  10.3 8.9 - 10.3 mg/dL   GFR, Estimated >39 >39 mL/min   Anion gap 14 5 - 15  CBC     Status: Abnormal   Collection Time: 01/06/24  8:52 AM  Result Value Ref Range   WBC 9.2 4.0 - 10.5 K/uL   RBC 5.78 (H) 3.87 - 5.11 MIL/uL   Hemoglobin 16.1 (H) 12.0 - 15.0 g/dL   HCT 51.0 (H) 63.9 - 53.9 %   MCV 84.6 80.0 - 100.0 fL   MCH 27.9 26.0 - 34.0 pg   MCHC 32.9 30.0 - 36.0 g/dL   RDW 85.3 88.4 - 84.4 %   Platelets 358 150 - 400 K/uL   nRBC 0.0 0.0 - 0.2 %  Protime-INR     Status: None   Collection Time: 01/06/24  8:52 AM  Result Value Ref Range   Prothrombin Time 13.3 11.4 - 15.2 seconds   INR 1.0 0.8 - 1.2  Culture, blood (Routine x 2)     Status: None (Preliminary result)   Collection Time: 01/06/24  8:52 AM   Specimen: BLOOD RIGHT ARM  Result Value Ref Range   Specimen Description      BLOOD RIGHT ARM Performed at [facility], [address] [place], [place] [address]    Special Requests      BOTTLES DRAWN AEROBIC AND ANAEROBIC Blood Culture adequate volume Performed at [facility], [address], [place], [place] [address]    Culture PENDING    Report Status PENDING   Urinalysis, w/ Reflex to Culture (Infection Suspected) -Urine, Clean Catch     Status: Abnormal   Collection  Time: 01/06/24  8:52 AM  Result Value Ref Range   Specimen Source URINE, CATHETERIZED    Color, Urine YELLOW YELLOW   APPearance CLEAR CLEAR   Specific Gravity, Urine 1.016 1.005 - 1.030   pH 7.0 5.0 - 8.0   Glucose, UA NEGATIVE NEGATIVE mg/dL   Hgb urine dipstick SMALL (A) NEGATIVE   Bilirubin Urine NEGATIVE NEGATIVE   Ketones, ur NEGATIVE NEGATIVE mg/dL   Protein, ur NEGATIVE NEGATIVE mg/dL   Nitrite NEGATIVE NEGATIVE   Leukocytes,Ua NEGATIVE NEGATIVE   RBC /  HPF 6-10 0 - 5 RBC/hpf   WBC, UA 0-5 0 - 5 WBC/hpf   Bacteria, UA RARE (A) NONE SEEN   Squamous Epithelial / HPF 0-5 0 - 5 /HPF   Mucus PRESENT    Hyaline Casts, UA PRESENT   Pro Brain natriuretic peptide     Status: None   Collection Time: 01/06/24  8:52 AM  Result Value Ref Range   Pro Brain Natriuretic Peptide 87.6 <300.0 pg/mL  I-Stat Lactic Acid, ED     Status: None   Collection Time: 01/06/24  9:33 AM  Result Value Ref Range   Lactic Acid, Venous 1.4 0.5 - 1.9 mmol/L     Assessment and Plan: Acute bronchitis/ COPD exacerbation - Nebs, steroids, add empiric antibiotics per protocol. - Symptomatic care - Check full item respiratory panel  2. Htn - resume Metoprolol   3. Seizure disorder - Continue Keppra   4. Tobacco Abuse -cessation counseling needed    Advance Care Planning:   Code Status: Full Code the patient wants to be full code and names her daughter as her surrogate decision maker.  Consults: None  Family Communication: Daughter at bedside  Severity of Illness: The appropriate patient status for this patient is INPATIENT. Inpatient status is judged to be reasonable and necessary in order to provide the required intensity of service to ensure the patient's safety. The patient's presenting symptoms, physical exam findings, and initial radiographic and laboratory data in the context of their chronic comorbidities is felt to place them at high risk for further clinical deterioration. Furthermore, it is not anticipated that the patient will be medically stable for discharge from the hospital within 2 midnights of admission.   * I certify that at the point of admission it is my clinical judgment that the patient will require inpatient hospital care spanning beyond 2 midnights from the point of admission due to high intensity of service, high risk for further deterioration and high frequency of surveillance required.*  Author: ARTHEA CHILD, MD 01/06/2024 4:41  PM  For on call review [email or web address].

## 2024-01-06 NOTE — Progress Notes (Signed)
 Pt would like to skip breathing treatments that were just ordered at this time so she can get something to eat. RN notified and pt made aware if she changes her mind to just let us  know.

## 2024-01-06 NOTE — ED Provider Notes (Signed)
 [facility] DEPARTMENT AT [facility] Provider Note   CSN: [id number] Arrival date & time: 01/06/24  9182     History {Add pertinent medical, surgical, social history, OB history to HPI:1} Chief Complaint  Patient presents with   Shortness of Breath    Ruth Bell is a 63 y.o. female with seizures/pseudoseizures, HTN, HLD, GERD, gout, asthma, anxiety/depression, migraines, anemia who presents with sore throat, cough, sneezing and nasal congestion since Wednesday, then developed SOB and worsening cough x 2 days. Pt is spitting up white/clear/yellow mucus. No hemoptysis. +fevers/chills at home. +headache on top of head as well and nausea/vomiting, unable ot keep fluids down since yesterday. Has been taking tylenol /ibuprofen  since last night. Has h/o asthma listed in her chart but was unaware of that; doesn't normally use any inhalers/nebulizers. Husband, coworkers, and son's girlfriend are all sick with similar symptoms.    Past Medical History:  Diagnosis Date   Anemia    Anxiety    Arm skin lesion, left 09/05/2014   Constipation 12/27/2013   Depression    Dysphagia 04/15/2016   Eczema 08/22/2015   GERD (gastroesophageal reflux disease)    Hematuria 03/04/2016   History of shingles 01/22/2016   Hyperglycemia 01/15/2015   Hyperlipidemia, mixed 08/22/2015   Hypertension    Menometrorrhagia 2006   Migraine, unspecified, without mention of intractable migraine without mention of status migrainosus    Preventative health care 11/04/2016   Pseudoseizures    Seizures (HCC)    history of pseudoseizure disorder, last last seizure 3 wks, keppra  inc in dosage   Tubular adenoma of colon 05/2011   Uterine leiomyoma 2006       Home Medications Prior to Admission medications   Medication Sig Start Date End Date Taking? Authorizing Provider  amLODipine  (NORVASC ) 5 MG tablet Take 1 tablet (5 mg total) by mouth daily. 10/01/22   Almarie Waddell NOVAK, NP  ascorbic acid (VITAMIN  C) 100 MG tablet Take 1 tablet by mouth daily.    [provider]  aspirin 81 MG chewable tablet Chew 81 mg by mouth daily.    [provider]  atorvastatin  (LIPITOR) 10 MG tablet TAKE 1 TABLET BY MOUTH EVERY DAY 01/25/22   Domenica Harlene LABOR, MD  butalbital -acetaminophen -caffeine  (FIORICET) 50-325-40 MG tablet TAKE 1 TABLET BY MOUTH TWICE A DAY 07/11/21   Domenica Harlene LABOR, MD  citalopram  (CELEXA ) 40 MG tablet Take 1 tablet by mouth daily.    [provider]  clorazepate (TRANXENE) 3.75 MG tablet Take by mouth.    [provider]  desloratadine  (CLARINEX ) 5 MG tablet Take 1 tablet (5 mg total) by mouth daily. 11/13/22   Soldatova, Liuba, MD  esomeprazole  (NEXIUM ) 40 MG capsule Take 1 capsule (40 mg total) by mouth daily. Use as needed for gerd 05/11/21   Copland, Jessica C, MD  famotidine  (PEPCID ) 20 MG tablet TAKE 1 TABLET BY MOUTH TWICE A DAY 06/10/23   Soldatova, Liuba, MD  fluticasone  (FLONASE ) 50 MCG/ACT nasal spray Place 2 sprays into both nostrils daily. 11/13/22   Soldatova, Liuba, MD  hyoscyamine  (ANASPAZ ) 0.125 MG TBDP disintergrating tablet Place 1 tablet (0.125 mg total) under the tongue every 4 (four) hours as needed. 10/10/23   Domenica Harlene LABOR, MD  levETIRAcetam  (KEPPRA ) 500 MG tablet Take 500 mg by mouth 2 (two) times daily.    [provider]  meloxicam  (MOBIC ) 15 MG tablet Take 1 tablet (15 mg total) by mouth daily. 10/30/22   Domenica Harlene LABOR,  MD  metoprolol  succinate (TOPROL -XL) 100 MG 24 hr tablet Take 1 tablet (100 mg total) by mouth 2 (two) times daily. Take with or immediately following a meal. 10/10/23   Domenica Harlene LABOR, MD  omeprazole  (PRILOSEC) 20 MG capsule Take by mouth.    [provider]  oxyCODONE -acetaminophen  (PERCOCET/ROXICET) 5-325 MG tablet Take 1-2 tablets by mouth every 6 (six) hours as needed for severe pain. 07/13/21   Dasie Faden, MD  polyethylene glycol powder (GLYCOLAX /MIRALAX ) 17 GM/SCOOP powder Take by  mouth. Patient not taking: Reported on 12/10/2023 07/28/21   [provider]  promethazine -dextromethorphan (PROMETHAZINE -DM) 6.25-15 MG/5ML syrup Take 5 mLs by mouth 4 (four) times daily as needed for cough. 08/17/22   Myrna Camelia HERO, NP  tiZANidine  (ZANAFLEX ) 2 MG tablet Take 1 tablet (2 mg total) by mouth every 6 (six) hours as needed. 05/11/21   Copland, Jessica C, MD      Allergies    Patient has no known allergies.    Review of Systems   Review of Systems A 10 point review of systems was performed and is negative unless otherwise reported in HPI.  Physical Exam Updated Vital Signs BP (!) 159/118 (BP Location: Right Arm)   Pulse 87   Temp 98.7 F (37.1 C) (Oral)   Resp (!) 22   LMP  (LMP Unknown)   SpO2 100%  Physical Exam General: Normal appearing {Desc; female/female:11659}, lying in bed.  HEENT: PERRLA, Sclera anicteric, MMM, trachea midline. Scratchy voice.  Cardiology: RRR, no murmurs/rubs/gallops.  Resp: Normal respiratory rate and effort. Coarse breath sounds in bilateral lower lobes w/ expiratory wheezing. Coarse cough.  Abd: Soft, non-tender, non-distended. No rebound tenderness or guarding.  GU: Deferred. MSK: No peripheral edema or signs of trauma. Extremities without deformity or TTP. No cyanosis or clubbing. Skin: warm, dry.  Neuro: A&Ox4, CNs II-XII grossly intact. MAEs. Sensation grossly intact.  Psych: Normal mood and affect.   ED Results / Procedures / Treatments   Labs (all labs ordered are listed, but only abnormal results are displayed) Labs Reviewed  RESP PANEL BY RT-PCR (RSV, FLU A&B, COVID)  RVPGX2  CULTURE, BLOOD (ROUTINE X 2)  CULTURE, BLOOD (ROUTINE X 2)  BASIC METABOLIC PANEL WITH GFR  CBC  PROTIME-INR  URINALYSIS, W/ REFLEX TO CULTURE (INFECTION SUSPECTED)  PRO BRAIN NATRIURETIC PEPTIDE  I-STAT CG4 LACTIC ACID, ED    EKG EKG Interpretation Date/Time:  Monday January 06 2024 08:26:07 EDT Ventricular Rate:  112 PR  Interval:  145 QRS Duration:  72 QT Interval:  334 QTC Calculation: 456 R Axis:   59  Text Interpretation: Sinus tachycardia Biatrial enlargement RSR' in V1 or V2, probably normal variant Probable left ventricular hypertrophy Baseline wander in lead(s) II III aVL aVF V1 V3 V4 Confirmed by Franklyn Gills [phone]) on 01/06/2024 8:51:24 AM  Radiology No results found.  Procedures Procedures  {Document cardiac monitor, telemetry assessment procedure when appropriate:1}  Medications Ordered in ED Medications  ondansetron  (ZOFRAN ) injection 4 mg (has no administration in time range)  ipratropium-albuterol  (DUONEB) 0.5-2.5 (3) MG/3ML nebulizer solution 3 mL (has no administration in time range)  acetaminophen  (TYLENOL ) tablet 1,000 mg (has no administration in time range)  lactated ringers  bolus 1,000 mL (has no administration in time range)    ED Course/ Medical Decision Making/ A&P                          Medical Decision Making Amount and/or Complexity of  Data Reviewed Labs: ordered. Decision-making details documented in ED Course. Radiology: ordered.  Risk OTC drugs. Prescription drug management.    This patient presents to the ED for concern of ***, this involves an extensive number of treatment options, and is a complaint that carries with it a high risk of complications and morbidity.  I considered the following differential and admission for this acute, potentially life threatening condition.   MDM:    DDX for dyspnea includes but is not limited to: Likely viral syndrome based on sxs. ***  Cardiac- CHF, Myocardial Ischemia, Valvular heart disease, Arrythmia, Cardiac tamponade   Respiratory - Pneumonia / atelectasis / pulmonary effusion / cavitary lung disease, Pneumothorax, COPD/ reactive airway disease, PE, ARDS   Other - Sepsis, Anemia     Clinical Course as of 01/06/24 0942  Mon Jan 06, 2024  0940 Lactic Acid, Venous: 1.4 [HN]    Clinical Course User  Index [HN] Franklyn Sid SAILOR, MD    Labs: I Ordered, and personally interpreted labs.  The pertinent results include:  those listed above  Imaging Studies ordered: I ordered imaging studies including CXR I independently visualized and interpreted imaging. I agree with the radiologist interpretation  Additional history obtained from chart review.  External records from outside source obtained and reviewed including ***  Cardiac Monitoring: The patient was maintained on a cardiac monitor.  I personally viewed and interpreted the cardiac monitored which showed an underlying rhythm of: ***  Reevaluation: After the interventions noted above, I reevaluated the patient and found that they have :{resolved/improved/worsened:23923::improved}  Social Determinants of Health: ***  Disposition:  ***  Co morbidities that complicate the patient evaluation  Past Medical History:  Diagnosis Date   Anemia    Anxiety    Arm skin lesion, left 09/05/2014   Constipation 12/27/2013   Depression    Dysphagia 04/15/2016   Eczema 08/22/2015   GERD (gastroesophageal reflux disease)    Hematuria 03/04/2016   History of shingles 01/22/2016   Hyperglycemia 01/15/2015   Hyperlipidemia, mixed 08/22/2015   Hypertension    Menometrorrhagia 2006   Migraine, unspecified, without mention of intractable migraine without mention of status migrainosus    Preventative health care 11/04/2016   Pseudoseizures    Seizures (HCC)    history of pseudoseizure disorder, last last seizure 3 wks, keppra  inc in dosage   Tubular adenoma of colon 05/2011   Uterine leiomyoma 2006     Medicines Meds ordered this encounter  Medications   ondansetron  (ZOFRAN ) injection 4 mg   ipratropium-albuterol  (DUONEB) 0.5-2.5 (3) MG/3ML nebulizer solution 3 mL   acetaminophen  (TYLENOL ) tablet 1,000 mg   DISCONTD: ketorolac  (TORADOL ) 15 MG/ML injection 15 mg   lactated ringers  bolus 1,000 mL    I have reviewed the patients home  medicines and have made adjustments as needed  Problem List / ED Course: Problem List Items Addressed This Visit   None        {Document critical care time when appropriate:1} {Document review of labs and clinical decision tools ie heart score, Chads2Vasc2 etc:1}  {Document your independent review of radiology images, and any outside records:1} {Document your discussion with family members, caretakers, and with consultants:1} {Document social determinants of health affecting pt's care:1} {Document your decision making why or why not admission, treatments were needed:1}  This note was created using dictation software, which may contain spelling or grammatical errors.

## 2024-01-06 NOTE — ED Triage Notes (Signed)
 Pt reports with SHOB and cough x 2 days. Pt is spitting up white/clear/yellow mucus. Pt is congested and reports a fever but has been taking Tylenol . Last taken at midnight.

## 2024-01-07 DIAGNOSIS — J441 Chronic obstructive pulmonary disease with (acute) exacerbation: Secondary | ICD-10-CM | POA: Diagnosis not present

## 2024-01-07 LAB — CBG MONITORING, ED: Glucose-Capillary: 206 mg/dL — ABNORMAL HIGH (ref 70–99)

## 2024-01-07 LAB — BASIC METABOLIC PANEL WITH GFR
Anion gap: 14 (ref 5–15)
BUN: 13 mg/dL (ref 8–23)
CO2: 22 mmol/L (ref 22–32)
Calcium: 10 mg/dL (ref 8.9–10.3)
Chloride: 104 mmol/L (ref 98–111)
Creatinine, Ser: 0.63 mg/dL (ref 0.44–1.00)
GFR, Estimated: >60 mL/min (ref >60)
Glucose, Bld: 100 mg/dL — ABNORMAL HIGH (ref 70–99)
Potassium: 4.1 mmol/L (ref 3.5–5.1)
Sodium: 140 mmol/L (ref 135–145)

## 2024-01-07 LAB — CBC
HCT: 43.5 % (ref 36.0–46.0)
Hemoglobin: 13.7 g/dL (ref 12.0–15.0)
MCH: 26.7 pg (ref 26.0–34.0)
MCHC: 31.5 g/dL (ref 30.0–36.0)
MCV: 84.8 fL (ref 80.0–100.0)
Platelets: 331 K/uL (ref 150–400)
RBC: 5.13 MIL/uL — ABNORMAL HIGH (ref 3.87–5.11)
RDW: 14.5 % (ref 11.5–15.5)
WBC: 11.3 K/uL — ABNORMAL HIGH (ref 4.0–10.5)
nRBC: 0 % (ref 0.0–0.2)

## 2024-01-07 LAB — HIV ANTIBODY (ROUTINE TESTING W REFLEX): HIV Screen 4th Generation wRfx: NONREACTIVE

## 2024-01-07 MED ORDER — LORAZEPAM 2 MG/ML IJ SOLN
2.0000 mg | Freq: Once | INTRAMUSCULAR | Status: AC
  Administered 2024-01-07: 2 mg via INTRAVENOUS
  Filled 2024-01-07: qty 1

## 2024-01-07 MED ORDER — PREDNISONE 50 MG PO TABS
50.0000 mg | ORAL_TABLET | Freq: Two times a day (BID) | ORAL | Status: DC
Start: 2024-01-07 — End: 2024-01-09
  Administered 2024-01-07 – 2024-01-08 (×3): 50 mg via ORAL
  Filled 2024-01-07 (×3): qty 1

## 2024-01-07 MED ORDER — LORAZEPAM 2 MG/ML IJ SOLN: 2.0000 mg | Freq: Once | INTRAMUSCULAR | Status: AC

## 2024-01-07 MED ORDER — LORAZEPAM 2 MG/ML IJ SOLN
2.0000 mg | INTRAMUSCULAR | Status: DC | PRN
  Administered 2024-01-08 – 2024-01-09 (×4): 2 mg via INTRAVENOUS
  Filled 2024-01-07 (×5): qty 1

## 2024-01-07 MED ORDER — LORAZEPAM 2 MG/ML IJ SOLN
INTRAMUSCULAR | Status: AC
  Administered 2024-01-07: 2 mg via INTRAVENOUS
  Filled 2024-01-07: qty 1

## 2024-01-07 NOTE — ED Notes (Signed)
 NT callled RN that patient start to shake while getting V/S. EDP informed. Ativan  2mg  IV given. Seizure lasted 4-5 mins before ativan . 2L [place] started. Seizure pads applied. Will continue to monitor.

## 2024-01-07 NOTE — Progress Notes (Signed)
 Patient noted shaking and body slightly jerking with eyes close. Lasting approx. 6 minutes. MD notified. Order given to give 2MG  Ativan  now IV.

## 2024-01-07 NOTE — Progress Notes (Signed)
 Progress Note   Patient: Ruth Bell FMW:[id number] DOB: [birth year] DOA: 01/06/2024     1 DOS: the patient was seen and examined on 01/07/2024   Brief hospital course:  63 y.o. female with medical history significant for seizure disorder, hypertension, hoarseness, tobacco abuse Who presents to the emergency department because she could not breathe.  The patient says for the last 3 days she has had severe cough with wheezing and shortness of breath all day.  This morning she was awakened out of her sleep because she could not breathe.  This has happened in the last 3 mornings.  Her temp this morning was 101 so when she found that out she drove herself to the emergency department for evaluation.  She has been having shaking chills for several days.  Her temperature this morning was 101.  No diarrhea or constipation.  Just difficulty breathing shortness of breath and cough.  Her chest wall is tender because of so much coughing  Assessment and Plan: Acute bronchitis/ COPD exacerbation - Nebs, steroids, add empiric antibiotics per protocol. - Symptomatic care - Viral panel negative   2. Htn - resume Metoprolol    3. Seizure disorder - Continue Keppra    4. Tobacco Abuse -cessation counseling needed         Subjective: Still wheezing with productive cough  Physical Exam: General: No acute distress, well developed, well nourished HEENT: Normocephalic, atraumatic, PERRL Cardiovascular: Normal rate and rhythm. Distal pulses intact. Pulmonary:End expiratory wheezing bilaterally Gastrointestinal: Nondistended abdomen, soft, very tender to deep touch below ribs bilaterally, hypoactive bowel sounds Musculoskeletal:Normal ROM, no lower ext edema Lymphadenopathy: No cervical LAD. Skin: Skin is warm and dry. Neuro: No focal deficits noted, AAOx3. PSYCH: Attentive and cooperative  Vitals:   01/07/24 0820 01/07/24 0847 01/07/24 0857 01/07/24 1207  BP: (!) 165/95  (!) 165/95 (!) 162/87   Pulse: 67  67 82  Resp: 16  16 16   Temp: 97.9 F (36.6 C)   98.5 F (36.9 C)  TempSrc:   Oral Oral  SpO2:  97% 97% 95%    Data Reviewed:    CBC    Component Value Date/Time   WBC 11.3 (H) 01/07/2024 0841   RBC 5.13 (H) 01/07/2024 0841   HGB 13.7 01/07/2024 0841   HGB 13.6 08/17/2022 1030   HCT 43.5 01/07/2024 0841   HCT 42.1 08/17/2022 1030   PLT 331 01/07/2024 0841   PLT 348 08/17/2022 1030   MCV 84.8 01/07/2024 0841   MCV 86 08/17/2022 1030   MCH 26.7 01/07/2024 0841   MCHC 31.5 01/07/2024 0841   RDW 14.5 01/07/2024 0841   RDW 14.9 08/17/2022 1030   LYMPHSABS 2.1 10/10/2023 1126   LYMPHSABS 2.5 08/17/2022 1030   MONOABS 0.4 10/10/2023 1126   EOSABS 0.1 10/10/2023 1126   EOSABS 0.1 08/17/2022 1030   BASOSABS 0.1 10/10/2023 1126   BASOSABS 0.1 08/17/2022 1030    CMP     Component Value Date/Time   NA 140 01/07/2024 0841   NA 146 (H) 06/14/2021 1526   K 4.1 01/07/2024 0841   CL 104 01/07/2024 0841   CO2 22 01/07/2024 0841   GLUCOSE 100 (H) 01/07/2024 0841   BUN 13 01/07/2024 0841   BUN 10 06/14/2021 1526   CREATININE 0.63 01/07/2024 0841   CREATININE 0.58 12/21/2019 1135   CALCIUM  10.0 01/07/2024 0841   PROT 8.1 10/10/2023 1126   ALBUMIN 4.8 10/10/2023 1126   AST 19 10/10/2023 1126   ALT 14 10/10/2023 1126  ALKPHOS 64 10/10/2023 1126   BILITOT 0.5 10/10/2023 1126   GFR 96.02 10/10/2023 1126   EGFR 107 06/14/2021 1526   GFRNONAA >60 01/07/2024 0841   GFRNONAA >89 05/16/2012 1349    Family Communication: None at bedside  Disposition: Status is: Inpatient Remains inpatient appropriate   Planned Discharge Destination: Home    Time spent: 35 minutes  Author: Landon FORBES Baller, MD 01/07/2024 1:32 PM  For on call review [email or web address].

## 2024-01-07 NOTE — Plan of Care (Signed)

## 2024-01-07 NOTE — TOC Initial Note (Signed)
 Transition of Care [facility]) - Initial/Assessment Note    Patient Details  Name: Ruth Bell MRN: [id number] Date of Birth: 03/25/1960  [facility] CM/SW Contact:    Doneta Glenys DASEN, RN Phone Number: 01/07/2024, 4:11 PM  Clinical Narrative:                 Presented for [facility]. PTA lives in a house with Detlefsen,William E (Spouse)[phone] and daughter;PCP/insurance verified;denies DME,HH,oxygen and SDOH needs; safe to return home and will drive self at discharge. No inpatient care needs identified during visit. Please place consult if needs present.  Expected Discharge Plan: Home/Self Care Barriers to Discharge: No Barriers Identified   Patient Goals and CMS Choice Patient states their goals for this hospitalization and ongoing recovery are:: Home CMS Medicare.gov Compare Post Acute Care list provided to::  (NA) Choice offered to / list presented to : NA [place] ownership interest in [facility].provided to:: Parent NA    Expected Discharge Plan and Services In-house Referral: NA Discharge Planning Services: NA   Living arrangements for the past 2 months: Single Family Home                 DME Arranged: N/A DME Agency: NA       HH Arranged: NA HH Agency: NA        Prior Living Arrangements/Services Living arrangements for the past 2 months: Single Family Home Lives with:: Significant Other Patient language and need for interpreter reviewed:: Yes Do you feel safe going back to the place where you live?: Yes      Need for Family Participation in Patient Care: No (Comment) Care giver support system in place?: Yes (comment) Current home services:  (NA) Criminal Activity/Legal Involvement Pertinent to Current Situation/Hospitalization: No - Comment as needed  Activities of Daily Living   ADL Screening (condition at time of admission) Independently performs ADLs?: Yes (appropriate for developmental age) Is the patient deaf or have difficulty  hearing?: No Does the patient have difficulty seeing, even when wearing glasses/contacts?: No Does the patient have difficulty concentrating, remembering, or making decisions?: No  Permission Sought/Granted Permission sought to share information with : Case Manager, Family Supports Permission granted to share information with : Yes, Verbal Permission Granted  Share Information with NAME: Teriana, Danker (Spouse)  [phone]           Emotional Assessment Appearance:: Appears stated age Attitude/Demeanor/Rapport: Engaged, Gracious Affect (typically observed): Appropriate Orientation: : Oriented to Self, Oriented to Place, Oriented to  Time, Oriented to Situation Alcohol / Substance Use: Not Applicable Psych Involvement: No (comment)  Admission diagnosis:  Obstructive lung disease (HCC) [J44.9] COPD with acute exacerbation (HCC) [J44.1] Acute viral bronchitis [J20.8] Patient Active Problem List   Diagnosis Date Noted   COPD with acute exacerbation (HCC) 01/06/2024   Aortic atherosclerosis 11/13/2022   Left-sided weakness 01/31/2021   Gout 01/31/2021   Urinary frequency 05/07/2017   Abdominal bloating 05/07/2017   Back pain 03/21/2017   Tachycardia 02/28/2017   Preventative health care 11/04/2016   Hoarseness 04/15/2016   Dysphagia 04/15/2016   Atypical chest pain 04/13/2016   Dysuria 03/04/2016   Hematuria 03/04/2016   Palpitations 01/30/2016   History of shingles 01/22/2016   Eczema 08/22/2015   Hyperlipidemia, mixed 08/22/2015   Hyperglycemia 01/15/2015   Lumbar radiculopathy 11/19/2014   GERD (gastroesophageal reflux disease) 09/05/2014   Noncompliance with medications 08/11/2014   Constipation 12/27/2013   Pain in joint, shoulder region 10/13/2013   Shoulder  pain 09/23/2013   Numbness and tingling in right hand 09/23/2013   Jaw pain 03/26/2013   Neck pain 05/17/2012   Seizure disorder (HCC) 02/21/2012   Non compliance w medication regimen 02/21/2012    Essential hypertension 09/06/2011   Abdominal pain 06/20/2011   Hemorrhoids 06/20/2011   Hx of adenomatous colonic polyps 06/13/2011   Tubular adenoma of colon 05/2011   Musculoskeletal chest pain 04/23/2011   Radiculopathy of leg 02/02/2011   History of psychogenic nonepileptic seizure 07/10/2010   Anxiety and depression 12/29/2009   TINEA PEDIS 06/02/2009   Exertional dyspnea 01/13/2008   MICROSCOPIC HEMATURIA 06/24/2007   WEIGHT LOSS 04/28/2007   Anemia 03/10/2007   Migraine 03/10/2007   ARTHRITIS 03/10/2007   SYNCOPE 03/10/2007   Menometrorrhagia 2006   PCP:  Domenica Harlene LABOR, MD Pharmacy:   [facility] [address], [place] - [address] [place] [address] Phone: [phone] Fax: [phone]  [facility]) - [place], [place] - [address] [facility] [address] [place] [address] [place] [address] Phone: [phone] Fax: [phone]  [facility] [address] [place] [address] Phone: [phone] Fax: [phone]     Social Drivers of Health (SDOH) Social History: SDOH Screenings   Food Insecurity: No Food Insecurity (01/07/2024)  Housing: Low Risk  (01/07/2024)  Transportation Needs: No Transportation Needs (01/07/2024)  Utilities: Not At Risk (01/07/2024)  Depression (PHQ2-9): Low Risk  (10/10/2023)  Social Connections: Moderately Integrated (01/07/2024)  Tobacco Use: High Risk (01/06/2024)   SDOH Interventions:     Readmission Risk Interventions    01/07/2024    4:09 PM  Readmission Risk Prevention Plan  Post Dischage Appt Complete  Medication Screening Complete  Transportation Screening Complete

## 2024-01-07 NOTE — ED Notes (Addendum)
 Re-checked vitals pt was more aware, currently resting at this time RN made aware

## 2024-01-08 DIAGNOSIS — J441 Chronic obstructive pulmonary disease with (acute) exacerbation: Secondary | ICD-10-CM | POA: Diagnosis not present

## 2024-01-08 MED ORDER — IPRATROPIUM-ALBUTEROL 0.5-2.5 (3) MG/3ML IN SOLN
3.0000 mL | Freq: Two times a day (BID) | RESPIRATORY_TRACT | Status: DC
  Filled 2024-01-08 (×2): qty 3

## 2024-01-08 MED ORDER — LEVETIRACETAM 500 MG PO TABS
750.0000 mg | ORAL_TABLET | Freq: Two times a day (BID) | ORAL | Status: DC
  Administered 2024-01-08 – 2024-01-10 (×5): 750 mg via ORAL
  Filled 2024-01-08 (×6): qty 1

## 2024-01-08 MED ORDER — IPRATROPIUM-ALBUTEROL 0.5-2.5 (3) MG/3ML IN SOLN
3.0000 mL | Freq: Four times a day (QID) | RESPIRATORY_TRACT | Status: DC
  Administered 2024-01-08: 3 mL via RESPIRATORY_TRACT
  Filled 2024-01-08: qty 3

## 2024-01-08 NOTE — Plan of Care (Incomplete)

## 2024-01-08 NOTE — Plan of Care (Signed)

## 2024-01-08 NOTE — Progress Notes (Signed)
 Progress Note   Patient: Ruth Bell FMW:[id number] DOB: September 18, 1960 DOA: 01/06/2024     2 DOS: the patient was seen and examined on 01/08/2024   Brief hospital course:  63 y.o. female with medical history significant for seizure disorder, hypertension, hoarseness, tobacco abuse Who presents to the emergency department because she could not breathe.  The patient says for the last 3 days she has had severe cough with wheezing and shortness of breath all day.  This morning she was awakened out of her sleep because she could not breathe.  This has happened in the last 3 mornings.  Her temp this morning was 101 so when she found that out she drove herself to the emergency department for evaluation.  She has been having shaking chills for several days.  Her temperature this morning was 101.  No diarrhea or constipation.  Just difficulty breathing shortness of breath and cough.  Her chest wall is tender because of so much coughing  Assessment and Plan: Acute bronchitis/ COPD exacerbation Patient is Still wheezing and has  dry cough - Nebs, steroids, add empiric antibiotics per protocol. - Symptomatic care - Viral panel negative   2. Htn - resume Metoprolol    3. Seizure disorder - Patient had an episode of seizure  Yesterday evening.Per RN patient had an episode of seizure at the ER.At the time she received Ativan .  She was given ativan  2 mg which broke seizure activity. She is on Keppra  500mg  BID, would increase her dose to 750 BID Spoke to Neurology-He stated that patient has  non epileptic events. she does not have real seizures. Advised to stop her Keppra . she is not on it at home. Please read detailed notes from Dr. Georjean from 2022.   Will dc Keppra     4. Tobacco Abuse -cessation counseling needed         Subjective: Still wheezing with productive cough  Physical Exam: General: No acute distress, well developed, well nourished HEENT: Normocephalic, atraumatic,  PERRL Cardiovascular: Normal rate and rhythm. Distal pulses intact. Pulmonary:End expiratory wheezing bilaterally Gastrointestinal: Nondistended abdomen, soft, very tender to deep touch below ribs bilaterally, hypoactive bowel sounds Musculoskeletal:Normal ROM, no lower ext edema Lymphadenopathy: No cervical LAD. Skin: Skin is warm and dry. Neuro: No focal deficits noted, AAOx3. PSYCH: Attentive and cooperative  Vitals:   01/07/24 2023 01/07/24 2313 01/08/24 0637 01/08/24 0834  BP: (!) 141/89  (!) 147/86   Pulse: 76  72   Resp:   18   Temp: 98 F (36.7 C)  98.1 F (36.7 C)   TempSrc: Axillary  Oral   SpO2: 97% 97% 100% 99%  Weight:      Height:        Data Reviewed:    CBC    Component Value Date/Time   WBC 11.3 (H) 01/07/2024 0841   RBC 5.13 (H) 01/07/2024 0841   HGB 13.7 01/07/2024 0841   HGB 13.6 08/17/2022 1030   HCT 43.5 01/07/2024 0841   HCT 42.1 08/17/2022 1030   PLT 331 01/07/2024 0841   PLT 348 08/17/2022 1030   MCV 84.8 01/07/2024 0841   MCV 86 08/17/2022 1030   MCH 26.7 01/07/2024 0841   MCHC 31.5 01/07/2024 0841   RDW 14.5 01/07/2024 0841   RDW 14.9 08/17/2022 1030   LYMPHSABS 2.1 10/10/2023 1126   LYMPHSABS 2.5 08/17/2022 1030   MONOABS 0.4 10/10/2023 1126   EOSABS 0.1 10/10/2023 1126   EOSABS 0.1 08/17/2022 1030   BASOSABS 0.1 10/10/2023  1126   BASOSABS 0.1 08/17/2022 1030    CMP     Component Value Date/Time   NA 140 01/07/2024 0841   NA 146 (H) 06/14/2021 1526   K 4.1 01/07/2024 0841   CL 104 01/07/2024 0841   CO2 22 01/07/2024 0841   GLUCOSE 100 (H) 01/07/2024 0841   BUN 13 01/07/2024 0841   BUN 10 06/14/2021 1526   CREATININE 0.63 01/07/2024 0841   CREATININE 0.58 12/21/2019 1135   CALCIUM  10.0 01/07/2024 0841   PROT 8.1 10/10/2023 1126   ALBUMIN 4.8 10/10/2023 1126   AST 19 10/10/2023 1126   ALT 14 10/10/2023 1126   ALKPHOS 64 10/10/2023 1126   BILITOT 0.5 10/10/2023 1126   GFR 96.02 10/10/2023 1126   EGFR 107 06/14/2021 1526    GFRNONAA >60 01/07/2024 0841   GFRNONAA >89 05/16/2012 1349    Family Communication: None at bedside  Disposition: Status is: Inpatient Remains inpatient appropriate   Planned Discharge Destination: Home    Time spent: 35 minutes  Author: Landon FORBES Baller, MD 01/08/2024 9:35 AM  For on call review [email or web address].

## 2024-01-08 NOTE — Plan of Care (Signed)
  Problem: Education: Goal: Knowledge of General Education information will improve Description: Including pain rating scale, medication(s)/side effects and non-pharmacologic comfort measures 01/08/2024 1656 by Claudene Rolin LABOR, RN Outcome: Progressing 01/08/2024 1656 by Claudene Rolin LABOR, RN Outcome: Progressing   Problem: Health Behavior/Discharge Planning: Goal: Ability to manage health-related needs will improve 01/08/2024 1656 by Claudene Rolin LABOR, RN Outcome: Progressing 01/08/2024 1656 by Claudene Rolin LABOR, RN Outcome: Progressing   Problem: Clinical Measurements: Goal: Ability to maintain clinical measurements within normal limits will improve 01/08/2024 1656 by Claudene Rolin LABOR, RN Outcome: Progressing 01/08/2024 1656 by Claudene Rolin LABOR, RN Outcome: Progressing Goal: Will remain free from infection 01/08/2024 1656 by Claudene Rolin LABOR, RN Outcome: Progressing 01/08/2024 1656 by Claudene Rolin LABOR, RN Outcome: Progressing Goal: Diagnostic test results will improve 01/08/2024 1656 by Claudene Rolin LABOR, RN Outcome: Progressing 01/08/2024 1656 by Claudene Rolin LABOR, RN Outcome: Progressing Goal: Respiratory complications will improve 01/08/2024 1656 by Claudene Rolin LABOR, RN Outcome: Progressing 01/08/2024 1656 by Claudene Rolin LABOR, RN Outcome: Progressing Goal: Cardiovascular complication will be avoided 01/08/2024 1656 by Claudene Rolin LABOR, RN Outcome: Progressing 01/08/2024 1656 by Claudene Rolin LABOR, RN Outcome: Progressing   Problem: Activity: Goal: Risk for activity intolerance will decrease 01/08/2024 1656 by Claudene Rolin LABOR, RN Outcome: Progressing 01/08/2024 1656 by Claudene Rolin LABOR, RN Outcome: Progressing   Problem: Nutrition: Goal: Adequate nutrition will be maintained 01/08/2024 1656 by Claudene Rolin LABOR, RN Outcome: Progressing 01/08/2024 1656 by Claudene Rolin LABOR, RN Outcome: Progressing   Problem: Coping: Goal: Level of anxiety will decrease 01/08/2024  1656 by Claudene Rolin LABOR, RN Outcome: Progressing 01/08/2024 1656 by Claudene Rolin LABOR, RN Outcome: Progressing   Problem: Elimination: Goal: Will not experience complications related to bowel motility 01/08/2024 1656 by Claudene Rolin LABOR, RN Outcome: Progressing 01/08/2024 1656 by Claudene Rolin LABOR, RN Outcome: Progressing Goal: Will not experience complications related to urinary retention 01/08/2024 1656 by Claudene Rolin LABOR, RN Outcome: Progressing 01/08/2024 1656 by Claudene Rolin LABOR, RN Outcome: Progressing   Problem: Pain Managment: Goal: General experience of comfort will improve and/or be controlled 01/08/2024 1656 by Claudene Rolin LABOR, RN Outcome: Progressing 01/08/2024 1656 by Claudene Rolin LABOR, RN Outcome: Progressing   Problem: Safety: Goal: Ability to remain free from injury will improve 01/08/2024 1656 by Claudene Rolin LABOR, RN Outcome: Progressing 01/08/2024 1656 by Claudene Rolin LABOR, RN Outcome: Progressing   Problem: Skin Integrity: Goal: Risk for impaired skin integrity will decrease 01/08/2024 1656 by Claudene Rolin LABOR, RN Outcome: Progressing 01/08/2024 1656 by Claudene Rolin LABOR, RN Outcome: Progressing

## 2024-01-09 DIAGNOSIS — J449 Chronic obstructive pulmonary disease, unspecified: Secondary | ICD-10-CM | POA: Diagnosis not present

## 2024-01-09 DIAGNOSIS — R739 Hyperglycemia, unspecified: Secondary | ICD-10-CM | POA: Diagnosis not present

## 2024-01-09 DIAGNOSIS — J208 Acute bronchitis due to other specified organisms: Secondary | ICD-10-CM | POA: Diagnosis not present

## 2024-01-09 DIAGNOSIS — J441 Chronic obstructive pulmonary disease with (acute) exacerbation: Secondary | ICD-10-CM | POA: Diagnosis not present

## 2024-01-09 MED ORDER — NICOTINE 21 MG/24HR TD PT24
21.0000 mg | MEDICATED_PATCH | Freq: Every day | TRANSDERMAL | Status: DC
  Administered 2024-01-09 – 2024-01-10 (×2): 21 mg via TRANSDERMAL
  Filled 2024-01-09 (×2): qty 1

## 2024-01-09 MED ORDER — IPRATROPIUM-ALBUTEROL 0.5-2.5 (3) MG/3ML IN SOLN
3.0000 mL | Freq: Three times a day (TID) | RESPIRATORY_TRACT | Status: DC
  Administered 2024-01-09 – 2024-01-10 (×5): 3 mL via RESPIRATORY_TRACT
  Filled 2024-01-09 (×5): qty 3

## 2024-01-09 MED ORDER — HYDRALAZINE HCL 20 MG/ML IJ SOLN: 10.0000 mg | Freq: Four times a day (QID) | INTRAMUSCULAR | Status: DC | PRN

## 2024-01-09 MED ORDER — AMLODIPINE BESYLATE 5 MG PO TABS
5.0000 mg | ORAL_TABLET | Freq: Every day | ORAL | Status: DC
  Administered 2024-01-09 – 2024-01-10 (×2): 5 mg via ORAL
  Filled 2024-01-09 (×2): qty 1

## 2024-01-09 MED ORDER — PREDNISONE 50 MG PO TABS
60.0000 mg | ORAL_TABLET | Freq: Every day | ORAL | Status: DC
  Administered 2024-01-09 – 2024-01-10 (×2): 60 mg via ORAL
  Filled 2024-01-09 (×2): qty 1

## 2024-01-09 MED ORDER — MELATONIN 5 MG PO TABS
5.0000 mg | ORAL_TABLET | Freq: Every evening | ORAL | Status: DC | PRN
  Administered 2024-01-09: 5 mg via ORAL
  Filled 2024-01-09: qty 1

## 2024-01-09 MED ORDER — ARFORMOTEROL TARTRATE 15 MCG/2ML IN NEBU
15.0000 ug | INHALATION_SOLUTION | Freq: Two times a day (BID) | RESPIRATORY_TRACT | Status: DC
  Administered 2024-01-09 – 2024-01-10 (×3): 15 ug via RESPIRATORY_TRACT
  Filled 2024-01-09 (×3): qty 2

## 2024-01-09 MED ORDER — BUDESONIDE 0.25 MG/2ML IN SUSP
0.2500 mg | Freq: Two times a day (BID) | RESPIRATORY_TRACT | Status: DC
  Administered 2024-01-09 – 2024-01-10 (×3): 0.25 mg via RESPIRATORY_TRACT
  Filled 2024-01-09 (×4): qty 2

## 2024-01-09 NOTE — Progress Notes (Signed)
 Pt is on the verge of tears and is fervently requesting a cigarette . RN to request a nicotine  patch for pt . Melvyn Charge RN aware  . . . Attending messaged .

## 2024-01-09 NOTE — Progress Notes (Addendum)
 [facility]                                                                              Ruth Bell, is a 63 y.o. female, DOB - [birth year], FMW:[id number] Admit date - 01/06/2024    Outpatient Primary MD for the patient is Domenica Harlene LABOR, MD  LOS - 3  days  Chief Complaint  Patient presents with   Shortness of Breath       Brief summary   Patient is a 63 year old female with HTN, hoarseness, tobacco abuse, seizures, previously deemed as psychogenic, follows neurology outpatient, presented to ED with acute shortness of breath.  Per patient, for the last 3 days prior to admission, had severe cough with wheezing and shortness of breath all day.  On the morning of admission she was awakened out of her sleep because of acute dyspnea.  Reported fever 101 F and chills.  Reported chest wall pain due to intractable coughing.  Assessment & Plan      COPD with acute exacerbation (HCC) -Mild wheezing, still coughing, although improving - Continue scheduled DuoNebs, added Pulmicort, Brovana, upon discharge, will place on ICS/LABA (Wixela, Symbicort, Dulera pending cost) - Decrease prednisone  to 60 mg daily, continue antitussives - Will likely need prednisone  taper upon discharge, continue empiric antibiotics - Respiratory virus panel negative, flu RSV, COVID-negative    Nicotine  abuse -Placed on nicotine  patch    Seizure disorder (HCC) -Had 1 episode of seizure during hospitalization and another episode in the ED.  On home meds noted to be on Keppra  500 mg twice daily which was increased by previous MD to 750 mg twice daily.  Although it is mentioned as psychogenic seizures by her neurologist, Dr Georjean but patient appears to be taking Keppra  outpatient [facility]. - For now, will continue as she is taking it at home and she will need to follow-up with her neurologist regarding Keppra .     Hyperglycemia  CBG (last 3)  Recent Labs    01/07/24 0041  GLUCAP 206*   Likely due to steroids, hemoglobin A1c 6.1 on 10/06/2023, will recheck in a.m.  GERD - Continue famotidine   Hypertension - BP readings elevated, placed on Norvasc  5 mg daily, IV hydralazine as needed with parameters  Underweight Estimated body mass index is 18.29 kg/m as calculated from the following:   Height as of this encounter: 5' 2 (1.575 m).   Weight as of this encounter: 45.4 kg.  Code Status: Full code DVT Prophylaxis:  enoxaparin  (LOVENOX ) injection 40 mg Start: 01/06/24 2200   Level of Care: Level of care: Med-Surg Family Communication: Updated patien Disposition Plan:      Remains inpatient appropriate: Plan to DC home in a.m.   Procedures:    Consultants:     Antimicrobials:   Anti-infectives (From admission, onward)    Start     Dose/Rate Route Frequency Ordered Stop   01/06/24 1800  azithromycin (ZITHROMAX) 500 mg in sodium chloride  0.9 % 250 mL IVPB        500 mg 250 mL/hr over 60 Minutes Intravenous Every 24 hours 01/06/24 1646  01/06/24 1700  cefTRIAXone  (ROCEPHIN ) 1 g in sodium chloride  0.9 % 100 mL IVPB        1 g 200 mL/hr over 30 Minutes Intravenous Every 24 hours 01/06/24 1646     01/06/24 0000  azithromycin (ZITHROMAX Z-PAK) 250 MG tablet        250 mg Oral Daily 01/06/24 1456            Medications  enoxaparin  (LOVENOX ) injection  40 mg Subcutaneous Q24H   famotidine   20 mg Oral BID   guaiFENesin   600 mg Oral BID   ipratropium-albuterol   3 mL Nebulization Once   ipratropium-albuterol   3 mL Nebulization BID   levETIRAcetam   750 mg Oral BID   metoprolol  succinate  100 mg Oral BID   nicotine   21 mg Transdermal Daily   predniSONE   60 mg Oral Q breakfast   sodium chloride  flush  3 mL Intravenous Q12H      Subjective:   Ruth Bell was seen and examined today.  Still having coughing but improving, mild wheezing.  No acute chest pain, acute shortness of breath, nausea vomiting abdominal pain.  No fevers. No acute events  overnight.    Objective:   Vitals:   01/08/24 2036 01/08/24 2244 01/09/24 0449 01/09/24 0952  BP: (!) 149/82 (!) 147/86 (!) 150/83 (!) 150/83  Pulse: 73 71 69 69  Resp: 18 18 18    Temp: 99 F (37.2 C) 98 F (36.7 C) 98.3 F (36.8 C)   TempSrc: Oral Oral Oral   SpO2: 98% 100% 98%   Weight:      Height:       No intake or output data in the 24 hours ending 01/09/24 1052   Wt Readings from Last 3 Encounters:  01/07/24 45.4 kg  12/24/23 52.2 kg  12/10/23 52.2 kg     Exam General: Alert and oriented x 3, NAD, coughing Cardiovascular: S1 S2 auscultated,  RRR Respiratory: Mild expiratory wheezing, improving Gastrointestinal: Soft, nontender, nondistended, + bowel sounds Ext: no pedal edema bilaterally Neuro: No new deficits Psych: Normal affect     Data Reviewed:  I have personally reviewed following labs    CBC Lab Results  Component Value Date   WBC 11.3 (H) 01/07/2024   RBC 5.13 (H) 01/07/2024   HGB 13.7 01/07/2024   HCT 43.5 01/07/2024   MCV 84.8 01/07/2024   MCH 26.7 01/07/2024   PLT 331 01/07/2024   MCHC 31.5 01/07/2024   RDW 14.5 01/07/2024   LYMPHSABS 2.1 10/10/2023   MONOABS 0.4 10/10/2023   EOSABS 0.1 10/10/2023   BASOSABS 0.1 10/10/2023     Last metabolic panel Lab Results  Component Value Date   NA 140 01/07/2024   K 4.1 01/07/2024   CL 104 01/07/2024   CO2 22 01/07/2024   BUN 13 01/07/2024   CREATININE 0.63 01/07/2024   GLUCOSE 100 (H) 01/07/2024   GFRNONAA >60 01/07/2024   GFRAA >60 05/17/2017   CALCIUM  10.0 01/07/2024   PHOS 2.9 10/30/2010   PROT 8.1 10/10/2023   ALBUMIN 4.8 10/10/2023   BILITOT 0.5 10/10/2023   ALKPHOS 64 10/10/2023   AST 19 10/10/2023   ALT 14 10/10/2023   ANIONGAP 14 01/07/2024    CBG (last 3)  Recent Labs    01/07/24 0041  GLUCAP 206*      Coagulation Profile: Recent Labs  Lab 01/06/24 0852  INR 1.0     Radiology Studies: I have personally reviewed the imaging studies  No results  found.     Nydia Distance M.D. [facility] 01/09/2024, 10:52 AM  Available via Epic secure chat 7am-7pm After 7 pm, please refer to night coverage provider listed on amion.

## 2024-01-09 NOTE — Plan of Care (Signed)

## 2024-01-10 ENCOUNTER — Other Ambulatory Visit (HOSPITAL_COMMUNITY): Payer: Self-pay

## 2024-01-10 ENCOUNTER — Other Ambulatory Visit: Payer: Self-pay

## 2024-01-10 DIAGNOSIS — J449 Chronic obstructive pulmonary disease, unspecified: Secondary | ICD-10-CM | POA: Diagnosis not present

## 2024-01-10 DIAGNOSIS — F320 Major depressive disorder, single episode, mild: Secondary | ICD-10-CM

## 2024-01-10 DIAGNOSIS — J441 Chronic obstructive pulmonary disease with (acute) exacerbation: Secondary | ICD-10-CM | POA: Diagnosis not present

## 2024-01-10 DIAGNOSIS — J208 Acute bronchitis due to other specified organisms: Secondary | ICD-10-CM | POA: Diagnosis not present

## 2024-01-10 DIAGNOSIS — F32A Depression, unspecified: Secondary | ICD-10-CM | POA: Insufficient documentation

## 2024-01-10 LAB — RENAL FUNCTION PANEL
Albumin: 4.1 g/dL (ref 3.5–5.0)
Anion gap: 12 (ref 5–15)
BUN: 13 mg/dL (ref 8–23)
CO2: 27 mmol/L (ref 22–32)
Calcium: 9.8 mg/dL (ref 8.9–10.3)
Chloride: 104 mmol/L (ref 98–111)
Creatinine, Ser: 0.73 mg/dL (ref 0.44–1.00)
GFR, Estimated: >60 mL/min (ref >60)
Glucose, Bld: 95 mg/dL (ref 70–99)
Phosphorus: 4.2 mg/dL (ref 2.5–4.6)
Potassium: 4.7 mmol/L (ref 3.5–5.1)
Sodium: 143 mmol/L (ref 135–145)

## 2024-01-10 LAB — CBC
HCT: 45.3 % (ref 36.0–46.0)
Hemoglobin: 14 g/dL (ref 12.0–15.0)
MCH: 27.1 pg (ref 26.0–34.0)
MCHC: 30.9 g/dL (ref 30.0–36.0)
MCV: 87.6 fL (ref 80.0–100.0)
Platelets: 358 K/uL (ref 150–400)
RBC: 5.17 MIL/uL — ABNORMAL HIGH (ref 3.87–5.11)
RDW: 14.6 % (ref 11.5–15.5)
WBC: 11.3 K/uL — ABNORMAL HIGH (ref 4.0–10.5)
nRBC: 0 % (ref 0.0–0.2)

## 2024-01-10 LAB — HEMOGLOBIN A1C
Hgb A1c MFr Bld: 5.9 % — ABNORMAL HIGH (ref 4.8–5.6)
Mean Plasma Glucose: 122.63 mg/dL

## 2024-01-10 LAB — TROPONIN T, HIGH SENSITIVITY: Troponin T High Sensitivity: <15 ng/L (ref 0–19)

## 2024-01-10 MED ORDER — GUAIFENESIN-CODEINE 100-10 MG/5ML PO SOLN
5.0000 mL | Freq: Four times a day (QID) | ORAL | 0 refills | Status: DC | PRN
  Filled 2024-01-10: qty 120, 6d supply, fill #0

## 2024-01-10 MED ORDER — FLUTICASONE PROPIONATE 50 MCG/ACT NA SUSP
2.0000 | Freq: Every day | NASAL | 6 refills | Status: AC
  Filled 2024-01-10: qty 16, 30d supply, fill #0

## 2024-01-10 MED ORDER — NICOTINE 21 MG/24HR TD PT24
21.0000 mg | MEDICATED_PATCH | Freq: Every day | TRANSDERMAL | 0 refills | Status: DC
  Filled 2024-01-10: qty 28, 28d supply, fill #0

## 2024-01-10 MED ORDER — PREDNISONE 10 MG PO TABS
ORAL_TABLET | ORAL | 0 refills | Status: DC
Start: 2024-01-10 — End: 2024-01-30
  Filled 2024-01-10: qty 30, 12d supply, fill #0

## 2024-01-10 MED ORDER — AMLODIPINE BESYLATE 5 MG PO TABS
5.0000 mg | ORAL_TABLET | Freq: Every day | ORAL | 3 refills | Status: DC
  Filled 2024-01-10: qty 30, 30d supply, fill #0

## 2024-01-10 MED ORDER — CEFPODOXIME PROXETIL 200 MG PO TABS
200.0000 mg | ORAL_TABLET | Freq: Two times a day (BID) | ORAL | 0 refills | Status: AC
  Filled 2024-01-10: qty 4, 2d supply, fill #0

## 2024-01-10 MED ORDER — ONDANSETRON HCL 4 MG PO TABS
4.0000 mg | ORAL_TABLET | Freq: Three times a day (TID) | ORAL | 0 refills | Status: AC | PRN
  Filled 2024-01-10: qty 30, 10d supply, fill #0

## 2024-01-10 MED ORDER — AZITHROMYCIN 250 MG PO TABS
500.0000 mg | ORAL_TABLET | Freq: Every day | ORAL | Status: DC
  Administered 2024-01-10: 500 mg via ORAL
  Filled 2024-01-10: qty 2

## 2024-01-10 MED ORDER — ALBUTEROL SULFATE HFA 108 (90 BASE) MCG/ACT IN AERS
1.0000 | INHALATION_SPRAY | RESPIRATORY_TRACT | 1 refills | Status: DC | PRN
  Filled 2024-01-10: qty 6.7, 30d supply, fill #0

## 2024-01-10 MED ORDER — MOMETASONE FURO-FORMOTEROL FUM 100-5 MCG/ACT IN AERO
2.0000 | INHALATION_SPRAY | Freq: Two times a day (BID) | RESPIRATORY_TRACT | 2 refills | Status: DC
  Filled 2024-01-10: qty 13, 30d supply, fill #0

## 2024-01-10 MED ORDER — HYDROXYZINE HCL 10 MG PO TABS
10.0000 mg | ORAL_TABLET | Freq: Three times a day (TID) | ORAL | 0 refills | Status: AC | PRN
  Filled 2024-01-10: qty 30, 10d supply, fill #0

## 2024-01-10 MED ORDER — FLUTICASONE-SALMETEROL 250-50 MCG/ACT IN AEPB
1.0000 | INHALATION_SPRAY | Freq: Two times a day (BID) | RESPIRATORY_TRACT | 4 refills | Status: DC
  Filled 2024-01-10: qty 60, 30d supply, fill #0

## 2024-01-10 MED ORDER — GUAIFENESIN ER 600 MG PO TB12
600.0000 mg | ORAL_TABLET | Freq: Two times a day (BID) | ORAL | 0 refills | Status: AC
  Filled 2024-01-10: qty 20, 10d supply, fill #0

## 2024-01-10 NOTE — TOC Transition Note (Addendum)
 Transition of Care [facility]) - Discharge Note   Patient Details  Name: Ruth Bell MRN: [id number] Date of Birth: 1960/06/19  [facility] CM/SW Contact:  Doneta Glenys DASEN, RN Phone Number: 01/10/2024, 12:08 PM   Clinical Narrative:    Per MD patient ready for discharge. CM spoke with patient an Ruth Bell son at bedside. We discussed the important of making and keeping psychiatry appointment. Inform them that the information for referral  are on the discharge paper work. Instructed Ruth (son) to call PCP to schedule a discharge follow up appointment.CM stressed multiple times how important it is to making appointments and that his mother needs to not cancel appointments. Patient's son Ruth transport home. Patient's car Ruth be retrieved at a later time.   Final next level of care: Home/Self Care Barriers to Discharge: No Barriers Identified   Patient Goals and CMS Choice Patient states their goals for this hospitalization and ongoing recovery are:: Home CMS Medicare.gov Compare Post Acute Care list provided to::  (na) Choice offered to / list presented to : NA Lane ownership interest in [facility].provided to:: Parent NA    Discharge Placement                  Name of family member notified: Ruth Bell son at bedside Patient and family notified of of transfer: 01/10/24  Discharge Plan and Services Additional resources added to the After Visit Summary for   In-house Referral: NA Discharge Planning Services: NA            DME Arranged: N/A DME Agency: NA       HH Arranged: NA HH Agency: NA        Social Drivers of Health (SDOH) Interventions SDOH Screenings   Food Insecurity: No Food Insecurity (01/07/2024)  Housing: Low Risk  (01/07/2024)  Transportation Needs: No Transportation Needs (01/07/2024)  Utilities: Not At Risk (01/07/2024)  Depression (PHQ2-9): Low Risk  (10/10/2023)  Social Connections: Moderately Integrated  (01/07/2024)  Tobacco Use: High Risk (01/06/2024)     Readmission Risk Interventions    01/07/2024    4:09 PM  Readmission Risk Prevention Plan  Post Dischage Appt Complete  Medication Screening Complete  Transportation Screening Complete

## 2024-01-10 NOTE — Discharge Summary (Addendum)
 Physician Discharge Summary   Patient: Ruth Bell MRN: [id number] DOB: [birth year]  Admit date:     01/06/2024  Discharge date: 01/10/24  Discharge Physician: Nydia Distance, MD    PCP: Ruth Harlene LABOR, MD   Recommendations at discharge:   Ambulatory referral to psychiatry sent, patient recommended to follow-up with her PCP and psychiatry Also needs to follow-up with her neurologist, Dr. Georjean. Continue Vantin 200 mg p.o. twice daily for 2 more days Continue prednisone  with taper 40 mg for 3 days, then 30 mg for 3 days, then 20 mg for 3 days, 10 mg for 3 days then off Started on Advair 115-21,  2 puffs twice daily Norvasc  5 mg daily  Discharge Diagnoses: :   COPD with acute exacerbation (HCC)   Seizure disorder (HCC)   Hyperglycemia   Hoarseness   GERD (gastroesophageal reflux disease)   Depression    Hospital Course:  Patient is a 63 year old female with HTN, hoarseness, tobacco abuse, seizures, previously deemed as psychogenic, follows neurology outpatient, presented to ED with acute shortness of breath.  Per patient, for the last 3 days prior to admission, had severe cough with wheezing and shortness of breath all day.  On the morning of admission she was awakened out of her sleep because of acute dyspnea.  Reported fever 101 F and chills.  Reported chest wall pain due to intractable coughing.    Assessment and Plan:   COPD with acute exacerbation (HCC) -Patient was noted to be wheezing on admission, placed on DuoNebs, Pulmicort, Brovana. -Upon discharge, placed on albuterol  inhaler, advair inhaler twice daily - Continue prednisone  with taper, completed Zithromax, continue Vantin 200 mg p.o. twice daily for 2 more days - Respiratory virus panel negative, flu RSV, COVID-negative       Nicotine  abuse -Placed on nicotine  patch     Seizure disorder (HCC) -Had 1 episode of seizure during hospitalization and another episode in the ED.  On home meds noted to be on Keppra   500 mg twice daily which was increased by previous MD to 750 mg twice daily.  It has been mentioned as psychogenic seizures by her neurologist, Dr Georjean but patient appears to be taking Keppra  outpatient per the [facility]. - For now, will continue as she is taking it at home and she will need to follow-up with her neurologist regarding Keppra .       Hyperglycemia Hemoglobin A1c 5.9, follow outpatient with PCP  GERD - Continue famotidine    Hypertension - BP readings elevated, started on Norvasc  5 mg daily   Underweight Estimated body mass index is 18.29 kg/m as calculated from the following:   Height as of this encounter: 5' 2 (1.575 m).   Weight as of this encounter: 45.4 kg.  Depression - Patient has symptoms of anxiety/depression, will benefit from outpatient counseling and therapy.  Ambulatory referral sent to psychiatry.      Pain control - [place]  Controlled Substance Reporting System database was reviewed. and patient was instructed, not to drive, operate heavy machinery, perform activities at heights, swimming or participation in water activities or provide baby-sitting services while on Pain, Sleep and Anxiety Medications; until their outpatient Physician has advised to do so again. Also recommended to not to take more than prescribed Pain, Sleep and Anxiety Medications.  Consultants: None Procedures performed: None Disposition: Home Diet recommendation:  Discharge Diet Orders (From admission, onward)     Start     Ordered   01/10/24 0000  Diet - low  sodium heart healthy        01/10/24 1104            DISCHARGE MEDICATION: Allergies as of 01/10/2024   No Known Allergies      Medication List     STOP taking these medications    ibuprofen  200 MG tablet Commonly known as: ADVIL        TAKE these medications    acetaminophen  500 MG tablet Commonly known as: TYLENOL  Take 1,000 mg by mouth every 6 (six) hours as needed for mild pain (pain score  1-3).   albuterol  108 (90 Base) MCG/ACT inhaler Commonly known as: VENTOLIN  HFA Inhale 1-2 puffs into the lungs every 4 (four) hours as needed for wheezing or shortness of breath.   amLODipine  5 MG tablet Commonly known as: NORVASC  Take 1 tablet (5 mg total) by mouth daily. Start taking on: January 11, 2024   ascorbic acid 100 MG tablet Commonly known as: VITAMIN C Take 1 tablet by mouth daily.   cefpodoxime 200 MG tablet Commonly known as: VANTIN Take 1 tablet (200 mg total) by mouth 2 (two) times daily for 2 days.   desloratadine  5 MG tablet Commonly known as: CLARINEX  Take 1 tablet (5 mg total) by mouth daily.   famotidine  20 MG tablet Commonly known as: PEPCID  TAKE 1 TABLET BY MOUTH TWICE A DAY   fluticasone  50 MCG/ACT nasal spray Commonly known as: FLONASE  Place 2 sprays into both nostrils daily.   fluticasone -salmeterol 115-21 MCG/ACT inhaler Commonly known as: Advair HFA Inhale 2 puffs into the lungs 2 (two) times daily.   guaiFENesin  600 MG 12 hr tablet Commonly known as: MUCINEX  Take 1 tablet (600 mg total) by mouth 2 (two) times daily.   guaiFENesin -codeine 100-10 MG/5ML syrup Take 5 mLs by mouth every 6 (six) hours as needed for cough.   hydrOXYzine  10 MG tablet Commonly known as: ATARAX  Take 1 tablet (10 mg total) by mouth every 8 (eight) hours as needed for anxiety.   hyoscyamine  0.125 MG Tbdp disintergrating tablet Commonly known as: ANASPAZ  Place 1 tablet (0.125 mg total) under the tongue every 4 (four) hours as needed.   levETIRAcetam  500 MG tablet Commonly known as: KEPPRA  Take 500 mg by mouth 2 (two) times daily.   metoprolol  succinate 100 MG 24 hr tablet Commonly known as: TOPROL -XL Take 1 tablet (100 mg total) by mouth 2 (two) times daily. Take with or immediately following a meal.   nicotine  21 mg/24hr patch Commonly known as: NICODERM CQ  - dosed in mg/24 hours Place 1 patch (21 mg total) onto the skin daily. Start taking on: January 11, 2024   ondansetron  4 MG tablet Commonly known as: Zofran  Take 1 tablet (4 mg total) by mouth every 8 (eight) hours as needed for nausea or vomiting.   predniSONE  10 MG tablet Commonly known as: DELTASONE  Prednisone  dosing: Take  Prednisone  40mg  (4 tabs) x 3 days, then taper to 30mg  (3 tabs) x 3 days, then 20mg  (2 tabs) x 3days, then 10mg  (1 tab) x 3days, then OFF.        Follow-up Information     Ruth Harlene LABOR, MD. Schedule an appointment as soon as possible for a visit in 1 week.   Specialty: Family Medicine Why: For follow-up Contact information: [address] [place] [address] [phone]         [facility] Emergency Department at [facility]. Go to .   Specialty: Emergency Medicine  Why: As needed, If symptoms worsen Contact information: [address] [facility] [place]  [address] [phone]               Discharge Exam: Ruth Bell   01/07/24 0825  Weight: 45.4 kg   S: No acute issues, no wheezing.  Appears to be somewhat anxious.  BP (!) 155/96 (BP Location: Left Arm)   Pulse 62   Temp 97.9 F (36.6 C) (Oral)   Resp 17   Ht 5' 2 (1.575 m)   Wt 45.4 kg   LMP  (LMP Unknown)   SpO2 100%   BMI 18.29 kg/m   Physical Exam General: Alert and oriented x 3, NAD Cardiovascular: S1 S2 clear, RRR.  Respiratory: CTAB, no wheezing, rales or rhonchi Gastrointestinal: Soft, nontender, nondistended, NBS Ext: no pedal edema bilaterally Neuro: no new deficits Psych: Normal affect    Condition at discharge: fair  The results of significant diagnostics from this hospitalization (including imaging, microbiology, ancillary and laboratory) are listed below for reference.   Imaging Studies: DG Chest 2 View Result Date: 01/06/2024 CLINICAL DATA:  Shortness of breath. EXAM: DG CHEST 2V COMPARISON:  08/17/2022 and CT chest 11/12/2022. FINDINGS: Trachea is midline. Heart size normal. Tiny right upper lobe  nodule is unchanged. Lungs are hyperinflated but otherwise clear. No pleural fluid. IMPRESSION: 1. No acute findings. 2. Tiny right upper lobe nodule, unchanged and better visualized on CT chest 11/12/2022, at which time it was reported as stable since 2011. 3. Hyperinflation. 4. Low-dose CT lung cancer screening is recommended for patients who are 93-8 years of age with a 20+ pack-year history of smoking and who are currently smoking or quit <=15 years ago. Electronically Signed   By: Newell Eke M.D.   On: 01/06/2024 10:18    Microbiology: Results for orders placed or performed during the hospital encounter of 01/06/24  Culture, blood (Routine x 2)     Status: None (Preliminary result)   Collection Time: 01/06/24  8:45 AM   Specimen: BLOOD LEFT HAND  Result Value Ref Range Status   Specimen Description   Final    BLOOD LEFT HAND Performed at [facility], [address], [place], [place] [address]    Special Requests   Final    BOTTLES DRAWN AEROBIC AND ANAEROBIC Blood Culture results may not be optimal due to an inadequate volume of blood received in culture bottles Performed at [facility], [address] [place], [place] [address]    Culture   Final    NO GROWTH 4 DAYS Performed at [facility], [address], [place], [place] [address]    Report Status PENDING  Incomplete  Resp panel by RT-PCR (RSV, Flu A&B, Covid) Anterior Nasal Swab     Status: None   Collection Time: 01/06/24  8:52 AM   Specimen: Anterior Nasal Swab  Result Value Ref Range Status   SARS Coronavirus 2 by RT PCR NEGATIVE NEGATIVE Final    Comment: (NOTE) SARS-CoV-2 target nucleic acids are NOT DETECTED.  The SARS-CoV-2 RNA is generally detectable in upper respiratory specimens during the acute phase of infection. The lowest concentration of SARS-CoV-2 viral copies this assay can detect is 138 copies/mL. A negative result does not preclude SARS-Cov-2 infection and should  not be used as the sole basis for treatment or other patient management decisions. A negative result may occur with  improper specimen collection/handling, submission of specimen other than nasopharyngeal swab, presence of  viral mutation(s) within the areas targeted by this assay, and inadequate number of viral copies(<138 copies/mL). A negative result must be combined with clinical observations, patient history, and epidemiological information. The expected result is Negative.  Fact Sheet for Patients:  BloggerCourse.com  Fact Sheet for Healthcare Providers:  SeriousBroker.it  This test is no t yet approved or cleared by the [place]  FDA and  has been authorized for detection and/or diagnosis of SARS-CoV-2 by FDA under an Emergency Use Authorization (EUA). This EUA will remain  in effect (meaning this test can be used) for the duration of the COVID-19 declaration under Section 564(b)(1) of the Act, 21 U.S.C.section 360bbb-3(b)(1), unless the authorization is terminated  or revoked sooner.       Influenza A by PCR NEGATIVE NEGATIVE Final   Influenza B by PCR NEGATIVE NEGATIVE Final    Comment: (NOTE) The Xpert Xpress SARS-CoV-2/FLU/RSV plus assay is intended as an aid in the diagnosis of influenza from Nasopharyngeal swab specimens and should not be used as a sole basis for treatment. Nasal washings and aspirates are unacceptable for Xpert Xpress SARS-CoV-2/FLU/RSV testing.  Fact Sheet for Patients: BloggerCourse.com  Fact Sheet for Healthcare Providers: SeriousBroker.it  This test is not yet approved or cleared by the [place]  FDA and has been authorized for detection and/or diagnosis of SARS-CoV-2 by FDA under an Emergency Use Authorization (EUA). This EUA will remain in effect (meaning this test can be used) for the duration of the COVID-19 declaration under  Section 564(b)(1) of the Act, 21 U.S.C. section 360bbb-3(b)(1), unless the authorization is terminated or revoked.     Resp Syncytial Virus by PCR NEGATIVE NEGATIVE Final    Comment: (NOTE) Fact Sheet for Patients: BloggerCourse.com  Fact Sheet for Healthcare Providers: SeriousBroker.it  This test is not yet approved or cleared by the [place]  FDA and has been authorized for detection and/or diagnosis of SARS-CoV-2 by FDA under an Emergency Use Authorization (EUA). This EUA will remain in effect (meaning this test can be used) for the duration of the COVID-19 declaration under Section 564(b)(1) of the Act, 21 U.S.C. section 360bbb-3(b)(1), unless the authorization is terminated or revoked.  Performed at [facility], [address], [place], [place] [address]   Culture, blood (Routine x 2)     Status: None (Preliminary result)   Collection Time: 01/06/24  8:52 AM   Specimen: BLOOD RIGHT ARM  Result Value Ref Range Status   Specimen Description   Final    BLOOD RIGHT ARM Performed at [facility], [address], [place], [place] [address]    Special Requests   Final    BOTTLES DRAWN AEROBIC AND ANAEROBIC Blood Culture adequate volume Performed at [facility], [address] [place], [place] [address]    Culture   Final    NO GROWTH 4 DAYS Performed at [facility], [address], [place], [place] [address]    Report Status PENDING  Incomplete  Respiratory (~20 pathogens) panel by PCR     Status: None   Collection Time: 01/06/24  8:52 AM   Specimen: Nasopharyngeal Swab; Respiratory  Result Value Ref Range Status   Adenovirus NOT DETECTED NOT DETECTED Final   Coronavirus 229E NOT DETECTED NOT DETECTED Final    Comment: (NOTE) The Coronavirus on the Respiratory Panel, DOES NOT test for the novel  Coronavirus (2019 nCoV)    Coronavirus HKU1 NOT DETECTED NOT  DETECTED Final   Coronavirus NL63 NOT  DETECTED NOT DETECTED Final   Coronavirus OC43 NOT DETECTED NOT DETECTED Final   Metapneumovirus NOT DETECTED NOT DETECTED Final   Rhinovirus / Enterovirus NOT DETECTED NOT DETECTED Final   Influenza A NOT DETECTED NOT DETECTED Final   Influenza B NOT DETECTED NOT DETECTED Final   Parainfluenza Virus 1 NOT DETECTED NOT DETECTED Final   Parainfluenza Virus 2 NOT DETECTED NOT DETECTED Final   Parainfluenza Virus 3 NOT DETECTED NOT DETECTED Final   Parainfluenza Virus 4 NOT DETECTED NOT DETECTED Final   Respiratory Syncytial Virus NOT DETECTED NOT DETECTED Final   Bordetella pertussis NOT DETECTED NOT DETECTED Final   Bordetella Parapertussis NOT DETECTED NOT DETECTED Final   Chlamydophila pneumoniae NOT DETECTED NOT DETECTED Final   Mycoplasma pneumoniae NOT DETECTED NOT DETECTED Final    Comment: Performed at [facility], [address] [place], [place] [address]    Labs: CBC: Recent Labs  Lab 01/06/24 [id number] 01/07/24 0841 01/10/24 0432  WBC 9.2 11.3* 11.3*  HGB 16.1* 13.7 14.0  HCT 48.9* 43.5 45.3  MCV 84.6 84.8 87.6  PLT 358 331 358   Basic Metabolic Panel: Recent Labs  Lab 01/06/24 0852 01/07/24 0841 01/10/24 0432  NA 142 140 143  K 3.8 4.1 4.7  CL 103 104 104  CO2 25 22 27   GLUCOSE 93 100* 95  BUN 8 13 13   CREATININE 0.89 0.63 0.73  CALCIUM  10.3 10.0 9.8  PHOS  --   --  4.2   Liver Function Tests: Recent Labs  Lab 01/10/24 0432  ALBUMIN 4.1   CBG: Recent Labs  Lab 01/07/24 0041  GLUCAP 206*    Discharge time spent: greater than 30 minutes.  Signed: Nydia Distance, MD [facility] 01/10/2024

## 2024-01-10 NOTE — Progress Notes (Signed)
 Additional discharge medication delivered to the patient at the bedside

## 2024-01-10 NOTE — Progress Notes (Signed)
 Discharge medications delivered to patient at the bedside.

## 2024-01-10 NOTE — Final Progress Note (Signed)
 AVS reviewed with son and patient. IV removed. Pt denies further needs.

## 2024-01-11 LAB — CULTURE, BLOOD (ROUTINE X 2)
Culture: NO GROWTH
Culture: NO GROWTH
Special Requests: ADEQUATE

## 2024-01-13 ENCOUNTER — Telehealth: Payer: Self-pay

## 2024-01-13 ENCOUNTER — Other Ambulatory Visit (HOSPITAL_COMMUNITY): Payer: Self-pay

## 2024-01-13 NOTE — Transitions of Care (Post Inpatient/ED Visit) (Signed)
 01/13/2024  Name: Ruth Bell MRN: [id number] DOB: 1961/01/13  Today's TOC FU Call Status: Today's TOC FU Call Status:: Successful TOC FU Call Completed TOC FU Call Complete Date: 01/13/24 Patient's Name and Date of Birth confirmed.  Transition Care Management Follow-up Telephone Call Date of Discharge: 01/10/24 Discharge Facility: [facility]) Type of Discharge: Inpatient Admission Primary Inpatient Discharge Diagnosis:: COPD How have you been since you were released from the hospital?: Same Any questions or concerns?: No  Items Reviewed: Did you receive and understand the discharge instructions provided?: Yes Medications obtained,verified, and reconciled?: Yes (Medications Reviewed) Any new allergies since your discharge?: No Dietary orders reviewed?: Yes Type of Diet Ordered:: Low Sodium Heart Healthy Do you have support at home?: Yes People in Home [RPT]: spouse, child(ren), adult Name of Support/Comfort Primary Source: Elsie Cook  Medications Reviewed Today: Medications Reviewed Today     Reviewed by Moises Reusing, RN (Case Manager) on 01/13/24 at 1551  Med List Status: <None>   Medication Order Taking? Sig Documenting Provider Last Dose Status Informant  acetaminophen  (TYLENOL ) 500 MG tablet [id number]  Take 1,000 mg by mouth every 6 (six) hours as needed for mild pain (pain score 1-3). [provider]  Active Self, Pharmacy Records  albuterol  (VENTOLIN  HFA) 108 [phone] Base) MCG/ACT inhaler [id number]  Inhale 1-2 puffs into the lungs every 4 (four) hours as needed for wheezing or shortness of breath. Rai, Nydia POUR, MD  Active   amLODipine  (NORVASC ) 5 MG tablet [id number]  Take 1 tablet (5 mg total) by mouth daily. Rai, Nydia POUR, MD  Active   ascorbic acid (VITAMIN C) 100 MG tablet [id number]  Take 1 tablet by mouth daily. [provider]  Active Self, Pharmacy Records  desloratadine  (CLARINEX ) 5 MG tablet [id number]  Take 1 tablet (5 mg total)  by mouth daily. Soldatova, Liuba, MD  Active Self, Pharmacy Records  famotidine  (PEPCID ) 20 MG tablet [id number]  TAKE 1 TABLET BY MOUTH TWICE A DAY Soldatova, Liuba, MD  Active Self, Pharmacy Records  fluticasone  (FLONASE ) 50 MCG/ACT nasal spray [id number]  Place 2 sprays into both nostrils daily. Rai, Nydia POUR, MD  Active   fluticasone -salmeterol (ADVAIR DISKUS) 250-50 MCG/ACT AEPB [id number]  Inhale 1 puff into the lungs 2 (two) times daily. Rai, Nydia POUR, MD  Active   guaiFENesin  (MUCINEX ) 600 MG 12 hr tablet [id number]  Take 1 tablet (600 mg total) by mouth 2 (two) times daily. Rai, Nydia POUR, MD  Active   guaiFENesin -codeine 100-10 MG/5ML syrup [id number]  Take 5 mLs by mouth every 6 (six) hours as needed for cough. Rai, Nydia POUR, MD  Active   hydrOXYzine  (ATARAX ) 10 MG tablet [id number]  Take 1 tablet (10 mg total) by mouth every 8 (eight) hours as needed for anxiety. Rai, Nydia POUR, MD  Active   hyoscyamine  (ANASPAZ ) 0.125 MG TBDP disintergrating tablet [id number]  Place 1 tablet (0.125 mg total) under the tongue every 4 (four) hours as needed. Domenica Harlene LABOR, MD  Active Self, Pharmacy Records  levETIRAcetam  (KEPPRA ) 500 MG tablet [id number]  Take 500 mg by mouth 2 (two) times daily. [provider]  Active Self, Pharmacy Records           Med Note DELETA DEBBY JONELLE Pablo Jan 06, 2024  3:51 PM) No recent fills but patient is adamant. She is using a surplus of pills she has at home.  metoprolol  succinate (TOPROL -XL) 100 MG 24 hr tablet [id number]  Take 1 tablet (  100 mg total) by mouth 2 (two) times daily. Take with or immediately following a meal. Domenica Harlene LABOR, MD  Active Self, Pharmacy Records  nicotine  (NICODERM CQ  - DOSED IN MG/24 HOURS) 21 mg/24hr patch [id number]  Place 1 patch (21 mg total) onto the skin daily. Rai, Nydia POUR, MD  Active   ondansetron  (ZOFRAN ) 4 MG tablet [id number]  Take 1 tablet (4 mg total) by mouth every 8 (eight) hours as needed for nausea or vomiting.  Rai, Nydia POUR, MD  Active   predniSONE  (DELTASONE ) 10 MG tablet [id number]  Prednisone  dosing: Take  Prednisone  40mg  (4 tabs) x 3 days, then taper to 30mg  (3 tabs) x 3 days, then 20mg  (2 tabs) x 3days, then 10mg  (1 tab) x 3days, then OFF. Davia Nydia POUR, MD  Active             Home Care and Equipment/Supplies: Were Home Health Services Ordered?: NA Any new equipment or medical supplies ordered?: NA  Functional Questionnaire: Do you need assistance with bathing/showering or dressing?: No Do you need assistance with meal preparation?: No Do you need assistance with eating?: No Do you have difficulty maintaining continence: Yes Do you need assistance with getting out of bed/getting out of a chair/moving?: No Do you have difficulty managing or taking your medications?: No  Follow up appointments reviewed: PCP Follow-up appointment confirmed?: Yes Date of PCP follow-up appointment?: 01/20/24 Follow-up Provider: Harlene Domenica Specialist [facility] Follow-up appointment confirmed?: Yes Date of Specialist follow-up appointment?: 01/29/24 Follow-Up Specialty Provider:: Dr. Zaida Do you need transportation to your follow-up appointment?: No Do you understand care options if your condition(s) worsen?: Yes-patient verbalized understanding  SDOH Interventions Today    Flowsheet Row Most Recent Value  SDOH Interventions   Food Insecurity Interventions Intervention Not Indicated  Housing Interventions Intervention Not Indicated  Transportation Interventions Intervention Not Indicated  Utilities Interventions Intervention Not Indicated    Goals Addressed             This Visit's Progress    VBCI Transitions of Care (TOC) Care Plan       Problems:  Recent Hospitalization for treatment of COPD Medication access barrier Difficulty with access to Nicotine  patch due to insurance declining and the patient has limited funds  Goal:  Over the next 30 days, the patient will not experience  hospital readmission  Interventions:   COPD Interventions: Advised patient to track and manage COPD triggers Discussed the importance of adequate rest and management of fatigue with COPD Provided education about and advised patient to utilize infection prevention strategies to reduce risk of respiratory infection Provided instruction about proper use of medications used for management of COPD including inhalers Smoking Cessation Take new medication Advair as directed  Patient Self Care Activities:  Attend all scheduled provider appointments Call pharmacy for medication refills 3-7 days in advance of running out of medications Call provider office for new concerns or questions  Notify RN Care Manager of [facility] call rescheduling needs Participate in Transition of Care Program/Attend [facility] scheduled calls Perform all self care activities independently  Take medications as prescribed    Plan:  Telephone follow up appointment with care management team member scheduled for:  Friday October 31st at 10:30am        Medford Balboa, BSN, RN [place]  VBCI - [facility] RN Care Manager [phone]

## 2024-01-13 NOTE — Patient Instructions (Signed)
 Visit Information  Thank you for taking time to visit with me today. Please don't hesitate to contact me if I can be of assistance to you before our next scheduled telephone appointment.  Our next appointment is by telephone on Friday October 31st at 10:30am  Following is a copy of your care plan:   Goals Addressed             This Visit's Progress    VBCI Transitions of Care (TOC) Care Plan       Problems:  Recent Hospitalization for treatment of COPD Medication access barrier Difficulty with access to Nicotine  patch due to insurance declining and the patient has limited funds  Goal:  Over the next 30 days, the patient will not experience hospital readmission  Interventions:   COPD Interventions: Advised patient to track and manage COPD triggers Discussed the importance of adequate rest and management of fatigue with COPD Provided education about and advised patient to utilize infection prevention strategies to reduce risk of respiratory infection Provided instruction about proper use of medications used for management of COPD including inhalers Smoking Cessation Take new medication Advair as directed  Patient Self Care Activities:  Attend all scheduled provider appointments Call pharmacy for medication refills 3-7 days in advance of running out of medications Call provider office for new concerns or questions  Notify RN Care Manager of [facility] call rescheduling needs Participate in Transition of Care Program/Attend [facility] scheduled calls Perform all self care activities independently  Take medications as prescribed    Plan:  Telephone follow up appointment with care management team member scheduled for:  Friday October 31st at 10:30am        Patient verbalizes understanding of instructions and care plan provided today and agrees to view in MyChart. Active MyChart status and patient understanding of how to access instructions and care plan via MyChart confirmed with patient.      The patient has been provided with contact information for the care management team and has been advised to call with any health related questions or concerns.   Please call the care guide team at [phone] if you need to cancel or reschedule your appointment.   Please call the Suicide and Crisis Lifeline: 988 call the [place]  National Suicide Prevention Lifeline: 3010529960 or TTY: 575 701 9953 TTY 3134582055) to talk to a trained counselor if you are experiencing a Mental Health or Behavioral Health Crisis or need someone to talk to.  Medford Balboa, BSN, RN [place]  VBCI - [facility] RN Care Manager [phone]

## 2024-01-17 ENCOUNTER — Other Ambulatory Visit

## 2024-01-17 DIAGNOSIS — J441 Chronic obstructive pulmonary disease with (acute) exacerbation: Secondary | ICD-10-CM

## 2024-01-17 NOTE — Transitions of Care (Post Inpatient/ED Visit) (Signed)
 Transition of Care week 2  Visit Note  01/17/2024  Name: Ruth Bell MRN: [id number]          DOB: [birth year]  Situation: Patient enrolled in [facility] 30-day program. Visit completed with Naomie Cook by telephone.   Background:   Initial Transition Care Management Follow-up Telephone Call Discharge Date and Diagnosis: 01/10/24, COPD   Past Medical History:  Diagnosis Date   Anemia    Anxiety    Arm skin lesion, left 09/05/2014   Constipation 12/27/2013   Depression    Dysphagia 04/15/2016   Eczema 08/22/2015   GERD (gastroesophageal reflux disease)    Hematuria 03/04/2016   History of shingles 01/22/2016   Hyperglycemia 01/15/2015   Hyperlipidemia, mixed 08/22/2015   Hypertension    Menometrorrhagia 2006   Migraine, unspecified, without mention of intractable migraine without mention of status migrainosus    Preventative health care 11/04/2016   Pseudoseizures    Seizures (HCC)    history of pseudoseizure disorder, last last seizure 3 wks, keppra  inc in dosage   Tubular adenoma of colon 05/2011   Uterine leiomyoma 2006    Assessment: Patient Reported Symptoms: Cognitive Cognitive Status: Alert and oriented to person, place, and time, Normal speech and language skills      Neurological Neurological Review of Symptoms: Headaches Neurological Comment: States they have been happening for two months  HEENT HEENT Symptoms Reported: Sore throat, Other: HEENT Management Strategies: Adequate rest HEENT Comment: Hoarseness has improved but it was difficult for the patient to talk    Cardiovascular Cardiovascular Symptoms Reported: No symptoms reported Cardiovascular Management Strategies: Medication therapy, Adequate rest  Respiratory Respiratory Symptoms Reported: Productive cough, Shortness of breath Additional Respiratory Details: The patient had audible productive cough during the interview. She states last night she woke up in a panic and couldn't breathe and took her inhalers  and use her nebulizer and was able to get her breathing under control. She feels the attack was more anxiety driven. Respiratory Management Strategies: Medication therapy, Adequate rest, Coping strategies  Endocrine Endocrine Symptoms Reported: No symptoms reported    Gastrointestinal Gastrointestinal Symptoms Reported: No symptoms reported      Genitourinary Genitourinary Symptoms Reported: Incontinence Genitourinary Management Strategies: Incontinence garment/pad  Integumentary Integumentary Symptoms Reported: No symptoms reported    Musculoskeletal Musculoskelatal Symptoms Reviewed: Weakness        Psychosocial Psychosocial Symptoms Reported: Anxiety - if selected complete GAD         There were no vitals filed for this visit.  Medications Reviewed Today     Reviewed by Moises Reusing, RN (Case Manager) on 01/17/24 at 1056  Med List Status: <None>   Medication Order Taking? Sig Documenting Provider Last Dose Status Informant  acetaminophen  (TYLENOL ) 500 MG tablet [id number] No Take 1,000 mg by mouth every 6 (six) hours as needed for mild pain (pain score 1-3). [provider] Past Week Active Self, Pharmacy Records  albuterol  (VENTOLIN  HFA) 108 (90 Base) MCG/ACT inhaler [id number]  Inhale 1-2 puffs into the lungs every 4 (four) hours as needed for wheezing or shortness of breath. Rai, Nydia POUR, MD  Active   amLODipine  (NORVASC ) 5 MG tablet [id number]  Take 1 tablet (5 mg total) by mouth daily. Rai, Nydia POUR, MD  Active   ascorbic acid (VITAMIN C) 100 MG tablet [id number] No Take 1 tablet by mouth daily. [provider] 01/05/2024 Active Self, Pharmacy Records  desloratadine  (CLARINEX ) 5 MG tablet [id number] No Take 1 tablet (5 mg total) by  mouth daily. Soldatova, Liuba, MD 01/05/2024 Active Self, Pharmacy Records  famotidine  (PEPCID ) 20 MG tablet [id number] No TAKE 1 TABLET BY MOUTH TWICE A DAY Soldatova, Liuba, MD 01/05/2024 Bedtime Active Self, Pharmacy  Records  fluticasone  (FLONASE ) 50 MCG/ACT nasal spray [id number]  Place 2 sprays into both nostrils daily. Rai, Nydia POUR, MD  Active   fluticasone -salmeterol (ADVAIR DISKUS) 250-50 MCG/ACT AEPB [id number]  Inhale 1 puff into the lungs 2 (two) times daily. Rai, Nydia POUR, MD  Active   guaiFENesin  (MUCINEX ) 600 MG 12 hr tablet [id number]  Take 1 tablet (600 mg total) by mouth 2 (two) times daily. Rai, Nydia POUR, MD  Active   guaiFENesin -codeine 100-10 MG/5ML syrup [id number]  Take 5 mLs by mouth every 6 (six) hours as needed for cough. Rai, Nydia POUR, MD  Active   hydrOXYzine  (ATARAX ) 10 MG tablet [id number]  Take 1 tablet (10 mg total) by mouth every 8 (eight) hours as needed for anxiety. Rai, Nydia POUR, MD  Active   hyoscyamine  (ANASPAZ ) 0.125 MG TBDP disintergrating tablet [id number] No Place 1 tablet (0.125 mg total) under the tongue every 4 (four) hours as needed. Domenica Harlene LABOR, MD Past Week Active Self, Pharmacy Records  levETIRAcetam  (KEPPRA ) 500 MG tablet [id number] No Take 500 mg by mouth 2 (two) times daily. [provider] 01/05/2024 Bedtime Active Self, Pharmacy Records           Med Note DELETA DEBBY JONELLE Pablo Jan 06, 2024  3:51 PM) No recent fills but patient is adamant. She is using a surplus of pills she has at home.  metoprolol  succinate (TOPROL -XL) 100 MG 24 hr tablet [id number] No Take 1 tablet (100 mg total) by mouth 2 (two) times daily. Take with or immediately following a meal. Domenica Harlene LABOR, MD 01/05/2024 Bedtime Active Self, Pharmacy Records  nicotine  (NICODERM CQ  - DOSED IN MG/24 HOURS) 21 mg/24hr patch [id number]  Place 1 patch (21 mg total) onto the skin daily.  Patient not taking: Reported on 01/13/2024   Rai, Nydia POUR, MD  Active   ondansetron  (ZOFRAN ) 4 MG tablet [id number]  Take 1 tablet (4 mg total) by mouth every 8 (eight) hours as needed for nausea or vomiting. Rai, Nydia POUR, MD  Active   predniSONE  (DELTASONE ) 10 MG tablet [id number]  Prednisone  dosing:  Take  Prednisone  40mg  (4 tabs) x 3 days, then taper to 30mg  (3 tabs) x 3 days, then 20mg  (2 tabs) x 3days, then 10mg  (1 tab) x 3days, then OFF. Davia Nydia POUR, MD  Active             Recommendation:   PCP Follow-up Specialty provider follow-up Psychiatry on November 5th Continue Current Plan of Care  Follow Up Plan:   Telephone follow-up in 1 week  Medford Balboa, BSN, RN [place]  VBCI - [facility] RN Care Manager [phone]

## 2024-01-17 NOTE — Patient Instructions (Signed)
 Visit Information  Thank you for taking time to visit with me today. Please don't hesitate to contact me if I can be of assistance to you before our next scheduled telephone appointment.  Our next appointment is by telephone on Friday November 7th at 10:00am  Following is a copy of your care plan:   Goals Addressed             This Visit's Progress    VBCI Transitions of Care (TOC) Care Plan       Problems: (reviewed 01/17/24) Recent Hospitalization for treatment of COPD Medication access barrier Difficulty with access to Nicotine  patch due to insurance declining and the patient has limited funds  Goal: (reviewed 01/17/24) Over the next 30 days, the patient will not experience hospital readmission  Interventions: (reviewed 01/17/24)  COPD Interventions: Advised patient to track and manage COPD triggers Discussed the importance of adequate rest and management of fatigue with COPD Provided education about and advised patient to utilize infection prevention strategies to reduce risk of respiratory infection Provided instruction about proper use of medications used for management of COPD including inhalers Smoking Cessation - Referral to Pharmacy for assistance Take new medication Advair as directed Take PRN Hydralizine for anxiety when SOB Recommendation to PCP for long term anit-anxiety med  Patient Self Care Activities:  Attend all scheduled provider appointments Call pharmacy for medication refills 3-7 days in advance of running out of medications Call provider office for new concerns or questions  Notify RN Care Manager of [facility] call rescheduling needs Participate in Transition of Care Program/Attend [facility] scheduled calls Perform all self care activities independently  Take medications as prescribed   Follow up with Psychiatry - scheduled for 01/22/24  Plan:  Telephone follow up appointment with care management team member scheduled for:  Friday November 7th at 10:00am         Patient verbalizes understanding of instructions and care plan provided today and agrees to view in [place]. Active MyChart status and patient understanding of how to access instructions and care plan via MyChart confirmed with patient.     The patient has been provided with contact information for the care management team and has been advised to call with any health related questions or concerns.   Please call the care guide team at [phone] if you need to cancel or reschedule your appointment.   Please call the Suicide and Crisis Lifeline: 988 call the [place]  National Suicide Prevention Lifeline: 848-362-8110 or TTY: 952 674 2514 TTY [phone]) to talk to a trained counselor if you are experiencing a Mental Health or Behavioral Health Crisis or need someone to talk to.  Medford Balboa, BSN, RN [place]  VBCI - [employer] [facility] RN Care Manager [phone]

## 2024-01-19 NOTE — Assessment & Plan Note (Deleted)
 hgba1c acceptable, minimize simple carbs. Increase exercise as tolerated. hgba1c acceptable, minimize simple carbs. Increase exercise as tolerated.

## 2024-01-19 NOTE — Assessment & Plan Note (Deleted)
 Encourage heart healthy diet such as MIND or DASH diet, increase exercise, avoid trans fats, simple carbohydrates and processed foods, consider a krill or fish or flaxseed oil cap daily.

## 2024-01-19 NOTE — Progress Notes (Deleted)
 Subjective:    Patient ID: Ruth Bell, female    DOB: March 28, 1960, 63 y.o.   MRN: [id number]  No chief complaint on file.   HPI Discussed the use of AI scribe software for clinical note transcription with the patient, who gave verbal consent to proceed.  History of Present Illness     Past Medical History:  Diagnosis Date  . Anemia   . Anxiety   . Arm skin lesion, left 09/05/2014  . Constipation 12/27/2013  . Depression   . Dysphagia 04/15/2016  . Eczema 08/22/2015  . GERD (gastroesophageal reflux disease)   . Hematuria 03/04/2016  . History of shingles 01/22/2016  . Hyperglycemia 01/15/2015  . Hyperlipidemia, mixed 08/22/2015  . Hypertension   . Menometrorrhagia 2006  . Migraine, unspecified, without mention of intractable migraine without mention of status migrainosus   . Preventative health care 11/04/2016  . Pseudoseizures   . Seizures (HCC)    history of pseudoseizure disorder, last last seizure 3 wks, keppra  inc in dosage  . Tubular adenoma of colon 05/2011  . Uterine leiomyoma 2006    Past Surgical History:  Procedure Laterality Date  . ABDOMINAL HYSTERECTOMY  04/06/04  . APPENDECTOMY    . BILATERAL SALPINGOOPHORECTOMY  04/06/04  . CARDIAC EVENT MONITOR  02/2016   Mostly sinus bradycardia and sinus rhythm.  Rare PVCs and PACs.  No arrhythmias.  Symptoms of chest pain and fluttering as well as passed out spell noted with normal sinus rhythm.  SABRA MASS EXCISION Left 06/02/2015   Procedure: EXCISION LEFT ARM MASS;  Surgeon: Deward Null III, MD;  Location: [facility];  Service: General;  Laterality: Left;  . NM MYOVIEW  LTD  04/2016   Reached heart rate of 127 bpm with Lexiscan .  EF 60 to 65%.  LOW RISK.  No ischemia or infarction.  SABRA ROTATOR CUFF REPAIR    . TRANSTHORACIC ECHOCARDIOGRAM  01/2016   F 55-6%.  No R WMA.  GR 1 DD.  Normal valves.    Family History  Problem Relation Age of Onset  . Heart disease Mother   . Heart attack Mother   . Cancer  Father        colon  . Hypertension Father   . Cancer Sister        brain  . Hypertension Sister   . Cancer Sister        unsure of type  . Diabetes Sister        type 2  . Heart attack Sister   . Cancer Brother   . Hypertension Brother   . Colon cancer Neg Hx   . Esophageal cancer Neg Hx   . Pancreatic cancer Neg Hx   . Stomach cancer Neg Hx   . Colon polyps Neg Hx     Social History   Socioeconomic History  . Marital status: Married    Spouse name: Elsie  . Number of children: 2  . Years of education: Not on file  . Highest education level: Not on file  Occupational History  . Occupation: CMA-retired  Tobacco Use  . Smoking status: Former    Current packs/day: 0.50    Average packs/day: 0.5 packs/day for 40.3 years (20.1 ttl pk-yrs)    Types: Cigarettes    Start date: 12/18/2015  . Smokeless tobacco: Never  . Tobacco comments:    3-4 cigarettes daily  Vaping Use  . Vaping status: Never Used  Substance and Sexual Activity  . Alcohol  use: Yes    Comment: wine occassioanlly   . Drug use: No  . Sexual activity: Not on file    Comment: lives with husband, no dietary restrictions.   Other Topics Concern  . Not on file  Social History Narrative   Lives with her husband and their son.   Daughter lives in [place], [place].   Right handed   Social Drivers of Health   Financial Resource Strain: Not on file  Food Insecurity: No Food Insecurity (01/13/2024)   Hunger Vital Sign   . Worried About Programme Researcher, Broadcasting/film/video in the Last Year: Never true   . Ran Out of Food in the Last Year: Never true  Transportation Needs: No Transportation Needs (01/13/2024)   PRAPARE - Transportation   . Lack of Transportation (Medical): No   . Lack of Transportation (Non-Medical): No  Physical Activity: Not on file  Stress: Not on file  Social Connections: Moderately Integrated (01/07/2024)   Social Connection and Isolation Panel   . Frequency of Communication with Friends and Family:  More than three times a week   . Frequency of Social Gatherings with Friends and Family: More than three times a week   . Attends Religious Services: More than 4 times per year   . Active Member of Clubs or Organizations: No   . Attends Banker Meetings: Never   . Marital Status: Married  Catering Manager Violence: Not At Risk (01/13/2024)   Humiliation, Afraid, Rape, and Kick questionnaire   . Fear of Current or Ex-Partner: No   . Emotionally Abused: No   . Physically Abused: No   . Sexually Abused: No    Outpatient Medications Prior to Visit  Medication Sig Dispense Refill  . acetaminophen  (TYLENOL ) 500 MG tablet Take 1,000 mg by mouth every 6 (six) hours as needed for mild pain (pain score 1-3).    . albuterol  (VENTOLIN  HFA) 108 (90 Base) MCG/ACT inhaler Inhale 1-2 puffs into the lungs every 4 (four) hours as needed for wheezing or shortness of breath. 6.7 g 1  . amLODipine  (NORVASC ) 5 MG tablet Take 1 tablet (5 mg total) by mouth daily. 30 tablet 3  . ascorbic acid (VITAMIN C) 100 MG tablet Take 1 tablet by mouth daily.    . desloratadine  (CLARINEX ) 5 MG tablet Take 1 tablet (5 mg total) by mouth daily. 90 tablet 3  . famotidine  (PEPCID ) 20 MG tablet TAKE 1 TABLET BY MOUTH TWICE A DAY 180 tablet 1  . fluticasone  (FLONASE ) 50 MCG/ACT nasal spray Place 2 sprays into both nostrils daily. 16 g 6  . fluticasone -salmeterol (ADVAIR DISKUS) 250-50 MCG/ACT AEPB Inhale 1 puff into the lungs 2 (two) times daily. 60 each 4  . guaiFENesin  (MUCINEX ) 600 MG 12 hr tablet Take 1 tablet (600 mg total) by mouth 2 (two) times daily. 60 tablet 0  . guaiFENesin -codeine 100-10 MG/5ML syrup Take 5 mLs by mouth every 6 (six) hours as needed for cough. 120 mL 0  . hydrOXYzine  (ATARAX ) 10 MG tablet Take 1 tablet (10 mg total) by mouth every 8 (eight) hours as needed for anxiety. 30 tablet 0  . hyoscyamine  (ANASPAZ ) 0.125 MG TBDP disintergrating tablet Place 1 tablet (0.125 mg total) under the tongue  every 4 (four) hours as needed. 30 tablet 1  . levETIRAcetam  (KEPPRA ) 500 MG tablet Take 500 mg by mouth 2 (two) times daily.    . metoprolol  succinate (TOPROL -XL) 100 MG 24 hr tablet Take 1 tablet (100 mg  total) by mouth 2 (two) times daily. Take with or immediately following a meal. 180 tablet 1  . nicotine  (NICODERM CQ  - DOSED IN MG/24 HOURS) 21 mg/24hr patch Place 1 patch (21 mg total) onto the skin daily. (Patient not taking: Reported on 01/13/2024) 28 patch 0  . ondansetron  (ZOFRAN ) 4 MG tablet Take 1 tablet (4 mg total) by mouth every 8 (eight) hours as needed for nausea or vomiting. 30 tablet 0  . predniSONE  (DELTASONE ) 10 MG tablet Prednisone  dosing: Take  Prednisone  40mg  (4 tabs) x 3 days, then taper to 30mg  (3 tabs) x 3 days, then 20mg  (2 tabs) x 3days, then 10mg  (1 tab) x 3days, then OFF. 30 tablet 0   No facility-administered medications prior to visit.    No Known Allergies  Review of Systems  Constitutional:  Negative for fever and malaise/fatigue.  HENT:  Negative for congestion.   Eyes:  Negative for blurred vision.  Respiratory:  Positive for cough and shortness of breath.   Cardiovascular:  Negative for chest pain, palpitations and leg swelling.  Gastrointestinal:  Negative for abdominal pain, blood in stool and nausea.  Genitourinary:  Negative for dysuria and frequency.  Musculoskeletal:  Negative for falls.  Skin:  Negative for rash.  Neurological:  Negative for dizziness, loss of consciousness and headaches.  Endo/Heme/Allergies:  Negative for environmental allergies.  Psychiatric/Behavioral:  Negative for depression. The patient is not nervous/anxious.        Objective:    Physical Exam Constitutional:      General: She is not in acute distress.    Appearance: Normal appearance. She is well-developed. She is not toxic-appearing.  HENT:     Head: Normocephalic and atraumatic.     Right Ear: External ear normal.     Left Ear: External ear normal.     Nose:  Nose normal.  Eyes:     General:        Right eye: No discharge.        Left eye: No discharge.     Conjunctiva/sclera: Conjunctivae normal.  Neck:     Thyroid : No thyromegaly.  Cardiovascular:     Rate and Rhythm: Normal rate and regular rhythm.     Heart sounds: Normal heart sounds. No murmur heard. Pulmonary:     Effort: Pulmonary effort is normal. No respiratory distress.     Breath sounds: Normal breath sounds.  Abdominal:     General: Bowel sounds are normal.     Palpations: Abdomen is soft.     Tenderness: There is no abdominal tenderness. There is no guarding.  Musculoskeletal:        General: Normal range of motion.     Cervical back: Neck supple.  Lymphadenopathy:     Cervical: No cervical adenopathy.  Skin:    General: Skin is warm and dry.  Neurological:     Mental Status: She is alert and oriented to person, place, and time.  Psychiatric:        Mood and Affect: Mood normal.        Behavior: Behavior normal.        Thought Content: Thought content normal.        Judgment: Judgment normal.    LMP  (LMP Unknown)  Wt Readings from Last 3 Encounters:  01/07/24 100 lb (45.4 kg)  12/24/23 115 lb (52.2 kg)  12/10/23 115 lb (52.2 kg)    Diabetic Foot Exam - Simple   No data filed  Lab Results  Component Value Date   WBC 11.3 (H) 01/10/2024   HGB 14.0 01/10/2024   HCT 45.3 01/10/2024   PLT 358 01/10/2024   GLUCOSE 95 01/10/2024   CHOL 208 (H) 10/10/2023   TRIG 66.0 10/10/2023   HDL 86.20 10/10/2023   LDLCALC 109 (H) 10/10/2023   ALT 14 10/10/2023   AST 19 10/10/2023   NA 143 01/10/2024   K 4.7 01/10/2024   CL 104 01/10/2024   CREATININE 0.73 01/10/2024   BUN 13 01/10/2024   CO2 27 01/10/2024   TSH 1.33 10/10/2023   INR 1.0 01/06/2024   HGBA1C 5.9 (H) 01/10/2024    Lab Results  Component Value Date   TSH 1.33 10/10/2023   Lab Results  Component Value Date   WBC 11.3 (H) 01/10/2024   HGB 14.0 01/10/2024   HCT 45.3 01/10/2024   MCV 87.6  01/10/2024   PLT 358 01/10/2024   Lab Results  Component Value Date   NA 143 01/10/2024   K 4.7 01/10/2024   CO2 27 01/10/2024   GLUCOSE 95 01/10/2024   BUN 13 01/10/2024   CREATININE 0.73 01/10/2024   BILITOT 0.5 10/10/2023   ALKPHOS 64 10/10/2023   AST 19 10/10/2023   ALT 14 10/10/2023   PROT 8.1 10/10/2023   ALBUMIN 4.1 01/10/2024   CALCIUM  9.8 01/10/2024   ANIONGAP 12 01/10/2024   EGFR 107 06/14/2021   GFR 96.02 10/10/2023   Lab Results  Component Value Date   CHOL 208 (H) 10/10/2023   Lab Results  Component Value Date   HDL 86.20 10/10/2023   Lab Results  Component Value Date   LDLCALC 109 (H) 10/10/2023   Lab Results  Component Value Date   TRIG 66.0 10/10/2023   Lab Results  Component Value Date   CHOLHDL 2 10/10/2023   Lab Results  Component Value Date   HGBA1C 5.9 (H) 01/10/2024       Assessment & Plan:  COPD with acute exacerbation (HCC) Assessment & Plan: Recently hospitalized with exacerbation.    Essential hypertension Assessment & Plan: Well controlled, no changes to meds. Encouraged heart healthy diet such as the DASH diet and exercise as tolerated.    Hyperglycemia Assessment & Plan: hgba1c acceptable, minimize simple carbs. Increase exercise as tolerated. hgba1c acceptable, minimize simple carbs. Increase exercise as tolerated.    Hyperlipidemia, mixed Assessment & Plan: Encourage heart healthy diet such as MIND or DASH diet, increase exercise, avoid trans fats, simple carbohydrates and processed foods, consider a krill or fish or flaxseed oil cap daily.      Assessment and Plan Assessment & Plan      Harlene Horton, MD

## 2024-01-19 NOTE — Assessment & Plan Note (Deleted)
 Recently hospitalized with exacerbation.

## 2024-01-19 NOTE — Assessment & Plan Note (Deleted)
 Well controlled, no changes to meds. Encouraged heart healthy diet such as the DASH diet and exercise as tolerated.

## 2024-01-20 ENCOUNTER — Inpatient Hospital Stay: Admitting: Family Medicine

## 2024-01-20 DIAGNOSIS — J441 Chronic obstructive pulmonary disease with (acute) exacerbation: Secondary | ICD-10-CM

## 2024-01-20 DIAGNOSIS — I10 Essential (primary) hypertension: Secondary | ICD-10-CM

## 2024-01-20 DIAGNOSIS — E782 Mixed hyperlipidemia: Secondary | ICD-10-CM

## 2024-01-20 DIAGNOSIS — R739 Hyperglycemia, unspecified: Secondary | ICD-10-CM

## 2024-01-20 NOTE — Assessment & Plan Note (Signed)
 Follows with pulmonology.  She was started on Advair daily and albuterol  as needed, continue these medications and follow-up with pulmonology. - Encouraged smoking cessation to prevent further exacerbations.

## 2024-01-20 NOTE — Progress Notes (Unsigned)
   Acute Office Visit  Subjective:     Patient ID: Ruth Bell, female    DOB: 02/04/1961, 63 y.o.   MRN: [id number]  No chief complaint on file.   *refer to psych  HPI Patient is in today for hospital Follow up.   Patient is a 63 year old female with PMHx -HTN, hoarseness, tobacco abuse, seizures-follows neurology outpatient.    COPD exacerbation   Nicotine  patch?  Currently smoking?  Lung cancer screening?  Recent hospitalization Admission date: 01-06-2024 Discharge date: 01-10-2024 CC: SOB Diagnosis: COPD with acute exacerbation  Patient was treated with prednisone  taper, Zithromax and Vantin  During this hospitalization patient was referred to psychiatry   -Upon discharge, placed on albuterol  inhaler, advair inhaler twice daily - Continue prednisone  with taper, completed Zithromax, continue Vantin 200 mg p.o. twice daily for 2 more days    ROS      Objective:    LMP  (LMP Unknown)  {Vitals History (Optional):23777}  Physical Exam  No results found for any visits on 01/21/24.      Assessment & Plan:   Problem List Items Addressed This Visit     Anxiety and depression   COPD with acute exacerbation (HCC)   Follows with pulmonology.  She was started on Advair daily and albuterol  as needed, continue these medications and follow-up with pulmonology.      Essential hypertension (Chronic)   Patient had high blood pressure in ED and was started on amlodipine  5 mg daily. Well controlled, no changes to meds. Encouraged heart healthy diet such as the DASH diet and exercise as tolerated.  Continue amlodipine  5 mg daily.      Seizure disorder (HCC) (Chronic)   On Keppra , follows with neurology. Had 1 episode of seizure during recent hospitalization and in ED per MD note.  It has been mentioned as psychogenic seizures by her neurologist, Dr Georjean. Continue Keppra  and follow up with neurology       Other Visit Diagnoses       Hospital discharge  follow-up    -  Primary       No orders of the defined types were placed in this encounter.   No follow-ups on file.  Daya Dutt L Tarrie Mcmichen, NP

## 2024-01-20 NOTE — Assessment & Plan Note (Signed)
 On Keppra , follows with neurology. Had 1 episode of seizure during recent hospitalization and in ED per MD note.  Per ED note, it has been mentioned as psychogenic seizures by her neurologist, Dr Georjean. Continue Keppra  and follow up with neurology. Refill for Keppra  provided today. Patient advised that future refills should be managed by Neurology. Continue FU with neurology. Referral sent.

## 2024-01-20 NOTE — Assessment & Plan Note (Signed)
 Patient had high blood pressure in ED and was started on amlodipine  5 mg daily. Well controlled, no changes to meds. Encouraged heart healthy diet such as the DASH diet and exercise as tolerated.  Continue amlodipine  5 mg daily.

## 2024-01-21 ENCOUNTER — Ambulatory Visit: Admitting: Student

## 2024-01-21 ENCOUNTER — Encounter: Payer: Self-pay | Admitting: Student

## 2024-01-21 VITALS — BP 131/71 | HR 83 | Temp 98.2°F | Ht 62.0 in | Wt 101.2 lb

## 2024-01-21 DIAGNOSIS — F32A Depression, unspecified: Secondary | ICD-10-CM

## 2024-01-21 DIAGNOSIS — F419 Anxiety disorder, unspecified: Secondary | ICD-10-CM | POA: Diagnosis not present

## 2024-01-21 DIAGNOSIS — G40909 Epilepsy, unspecified, not intractable, without status epilepticus: Secondary | ICD-10-CM | POA: Diagnosis not present

## 2024-01-21 DIAGNOSIS — R636 Underweight: Secondary | ICD-10-CM | POA: Diagnosis not present

## 2024-01-21 DIAGNOSIS — R300 Dysuria: Secondary | ICD-10-CM | POA: Diagnosis not present

## 2024-01-21 DIAGNOSIS — Z09 Encounter for follow-up examination after completed treatment for conditions other than malignant neoplasm: Secondary | ICD-10-CM

## 2024-01-21 DIAGNOSIS — J441 Chronic obstructive pulmonary disease with (acute) exacerbation: Secondary | ICD-10-CM | POA: Diagnosis not present

## 2024-01-21 DIAGNOSIS — Z681 Body mass index (BMI) 19 or less, adult: Secondary | ICD-10-CM | POA: Insufficient documentation

## 2024-01-21 DIAGNOSIS — F17200 Nicotine dependence, unspecified, uncomplicated: Secondary | ICD-10-CM | POA: Insufficient documentation

## 2024-01-21 DIAGNOSIS — I10 Essential (primary) hypertension: Secondary | ICD-10-CM | POA: Diagnosis not present

## 2024-01-21 LAB — POC URINALSYSI DIPSTICK (AUTOMATED)
Bilirubin, UA: NEGATIVE
Glucose, UA: NEGATIVE
Ketones, UA: NEGATIVE
Nitrite, UA: NEGATIVE
Protein, UA: NEGATIVE
Spec Grav, UA: 1.01 (ref 1.010–1.025)
Urobilinogen, UA: 0.2 U/dL
pH, UA: 7 (ref 5.0–8.0)

## 2024-01-21 MED ORDER — NITROFURANTOIN MONOHYD MACRO 100 MG PO CAPS: 100.0000 mg | ORAL_CAPSULE | Freq: Two times a day (BID) | ORAL | 0 refills | Status: AC

## 2024-01-21 MED ORDER — LEVETIRACETAM 500 MG PO TABS: 500.0000 mg | ORAL_TABLET | Freq: Two times a day (BID) | ORAL | 1 refills | Status: DC

## 2024-01-21 NOTE — Assessment & Plan Note (Signed)
 BMI Readings from Last 1 Encounters:  01/21/24 18.51 kg/m   Encouraged increase protein intake through nutrient-dense foods such as eggs, yogurt, fish, chicken, beans, and dairy. Advised planning small, frequent meals and snacks throughout the day to promote adequate nutrition and gradual, healthy weight gain. Recommended incorporating high-calorie, high-protein supplements (e.g., Ensure, Boost) if appetite is limited.

## 2024-01-21 NOTE — Assessment & Plan Note (Addendum)
 Patient reports she is currently smoking 4 or less cigarettes per day.   Patient reports her insurance does not cover nicotine  patches. Advised patient to use 7 mg over-the-counter nicotine  patches to support smoking cessation efforts and to follow package instructions carefully for proper use and dosing.

## 2024-01-21 NOTE — Assessment & Plan Note (Signed)
 Patient having increased anxiety and reports she will be starting with behavioral health, she was referred to psychiatry during ED stay.  Encourage follow-up with behavioral health.

## 2024-01-22 ENCOUNTER — Encounter: Payer: Self-pay | Admitting: Obstetrics

## 2024-01-22 ENCOUNTER — Other Ambulatory Visit

## 2024-01-22 ENCOUNTER — Ambulatory Visit: Admitting: Obstetrics

## 2024-01-22 ENCOUNTER — Telehealth: Payer: Self-pay | Admitting: *Deleted

## 2024-01-22 ENCOUNTER — Encounter: Payer: Self-pay | Admitting: Neurology

## 2024-01-22 ENCOUNTER — Other Ambulatory Visit (HOSPITAL_COMMUNITY)
Admission: RE | Admit: 2024-01-22 | Discharge: 2024-01-22 | Disposition: A | Discharge status: Home / Self Care | Source: Other Acute Inpatient Hospital | Attending: Obstetrics | Admitting: Obstetrics

## 2024-01-22 ENCOUNTER — Ambulatory Visit: Payer: Self-pay | Admitting: Obstetrics

## 2024-01-22 VITALS — BP 166/99 | HR 87 | Ht 62.8 in | Wt 99.8 lb

## 2024-01-22 DIAGNOSIS — R3129 Other microscopic hematuria: Secondary | ICD-10-CM

## 2024-01-22 DIAGNOSIS — R319 Hematuria, unspecified: Secondary | ICD-10-CM | POA: Diagnosis present

## 2024-01-22 DIAGNOSIS — Z09 Encounter for follow-up examination after completed treatment for conditions other than malignant neoplasm: Secondary | ICD-10-CM

## 2024-01-22 DIAGNOSIS — N3946 Mixed incontinence: Secondary | ICD-10-CM | POA: Insufficient documentation

## 2024-01-22 DIAGNOSIS — N952 Postmenopausal atrophic vaginitis: Secondary | ICD-10-CM | POA: Insufficient documentation

## 2024-01-22 DIAGNOSIS — R102 Pelvic and perineal pain unspecified side: Secondary | ICD-10-CM | POA: Insufficient documentation

## 2024-01-22 DIAGNOSIS — R300 Dysuria: Secondary | ICD-10-CM

## 2024-01-22 DIAGNOSIS — G40909 Epilepsy, unspecified, not intractable, without status epilepticus: Secondary | ICD-10-CM | POA: Diagnosis not present

## 2024-01-22 DIAGNOSIS — R8290 Unspecified abnormal findings in urine: Secondary | ICD-10-CM | POA: Insufficient documentation

## 2024-01-22 DIAGNOSIS — I10 Essential (primary) hypertension: Secondary | ICD-10-CM

## 2024-01-22 DIAGNOSIS — N393 Stress incontinence (female) (male): Secondary | ICD-10-CM | POA: Insufficient documentation

## 2024-01-22 DIAGNOSIS — R351 Nocturia: Secondary | ICD-10-CM | POA: Diagnosis not present

## 2024-01-22 DIAGNOSIS — K5909 Other constipation: Secondary | ICD-10-CM

## 2024-01-22 HISTORY — DX: Other microscopic hematuria: R31.29

## 2024-01-22 LAB — POCT URINALYSIS DIP (CLINITEK)
Bilirubin, UA: NEGATIVE
Bilirubin, UA: NEGATIVE
Glucose, UA: NEGATIVE mg/dL
Glucose, UA: NEGATIVE mg/dL
Ketones, POC UA: NEGATIVE mg/dL
Ketones, POC UA: NEGATIVE mg/dL
Leukocytes, UA: NEGATIVE
Leukocytes, UA: NEGATIVE
Nitrite, UA: NEGATIVE
Nitrite, UA: NEGATIVE
POC PROTEIN,UA: NEGATIVE
POC PROTEIN,UA: NEGATIVE
Spec Grav, UA: 1.02 (ref 1.010–1.025)
Spec Grav, UA: 1.02 (ref 1.010–1.025)
Urobilinogen, UA: 0.2 U/dL — AB
Urobilinogen, UA: 0.2 U/dL
pH, UA: 7 (ref 5.0–8.0)
pH, UA: 7 (ref 5.0–8.0)

## 2024-01-22 LAB — URINE CULTURE
MICRO NUMBER:: 17188442
Result:: NO GROWTH
SPECIMEN QUALITY:: ADEQUATE

## 2024-01-22 LAB — URINALYSIS, COMPLETE (UACMP) WITH MICROSCOPIC
Bacteria, UA: NONE SEEN
Bilirubin Urine: NEGATIVE
Glucose, UA: NEGATIVE mg/dL
Ketones, ur: NEGATIVE mg/dL
Leukocytes,Ua: NEGATIVE
Nitrite: NEGATIVE
Protein, ur: NEGATIVE mg/dL
Specific Gravity, Urine: 1.014 (ref 1.005–1.030)
pH: 7 (ref 5.0–8.0)

## 2024-01-22 LAB — COMPREHENSIVE METABOLIC PANEL WITH GFR
ALT: 28 U/L (ref 0–35)
AST: 17 U/L (ref 0–37)
Albumin: 4.3 g/dL (ref 3.5–5.2)
Alkaline Phosphatase: 46 U/L (ref 39–117)
BUN: 13 mg/dL (ref 6–23)
CO2: 28 meq/L (ref 19–32)
Calcium: 9.2 mg/dL (ref 8.4–10.5)
Chloride: 102 meq/L (ref 96–112)
Creatinine, Ser: 0.5 mg/dL (ref 0.40–1.20)
GFR: 98.48 mL/min (ref >60.00)
Glucose, Bld: 96 mg/dL (ref 70–99)
Potassium: 3.6 meq/L (ref 3.5–5.1)
Sodium: 139 meq/L (ref 135–145)
Total Bilirubin: 0.6 mg/dL (ref 0.2–1.2)
Total Protein: 6.7 g/dL (ref 6.0–8.3)

## 2024-01-22 LAB — CBC WITH DIFFERENTIAL/PLATELET
Basophils Absolute: 0 K/uL (ref 0.0–0.1)
Basophils Relative: 0.4 % (ref 0.0–3.0)
Eosinophils Absolute: 0.1 K/uL (ref 0.0–0.7)
Eosinophils Relative: 0.6 % (ref 0.0–5.0)
HCT: 37.4 % (ref 36.0–46.0)
Hemoglobin: 12.5 g/dL (ref 12.0–15.0)
Lymphocytes Relative: 37 % (ref 12.0–46.0)
Lymphs Abs: 3.9 K/uL (ref 0.7–4.0)
MCHC: 33.3 g/dL (ref 30.0–36.0)
MCV: 84 fl (ref 78.0–100.0)
Monocytes Absolute: 0.6 K/uL (ref 0.1–1.0)
Monocytes Relative: 5.7 % (ref 3.0–12.0)
Neutro Abs: 6 K/uL (ref 1.4–7.7)
Neutrophils Relative %: 56.3 % (ref 43.0–77.0)
Platelets: 350 K/uL (ref 150.0–400.0)
RBC: 4.45 Mil/uL (ref 3.87–5.11)
RDW: 14.1 % (ref 11.5–15.5)
WBC: 10.6 K/uL — ABNORMAL HIGH (ref 4.0–10.5)

## 2024-01-22 MED ORDER — TROSPIUM CHLORIDE 20 MG PO TABS: 20.0000 mg | ORAL_TABLET | Freq: Two times a day (BID) | ORAL | 2 refills | Status: DC

## 2024-01-22 MED ORDER — ESTRADIOL 0.01 % VA CREA: TOPICAL_CREAM | VAGINAL | 3 refills | Status: AC

## 2024-01-22 NOTE — Assessment & Plan Note (Signed)
-   avoid fluid intake 3 hours before bedtime - reassess after trospium

## 2024-01-22 NOTE — Assessment & Plan Note (Signed)
-   For constipation, we reviewed the importance of a better bowel regimen.  We also discussed the importance of avoiding chronic straining, as it can exacerbate her pelvic floor symptoms; we discussed treating constipation and straining prior to surgery, as postoperative straining can lead to damage to the repair and recurrence of symptoms. We discussed initiating therapy with increasing fluid intake, fiber supplementation, stool softeners, and laxatives such as miralax .  - encouraged squatting position for defecation to reduce straining and start titration of Citrucel - discussed association with pelvic floor disorders - referral to pelvic floor PT

## 2024-01-22 NOTE — Assessment & Plan Note (Signed)
-   POCT UA + heme, pending UA microscopy and culture - likely due to atrophy, Rx to start low dose vaginal estrogen - patient denies prior workup - For management of microscopic hematuria, we discussed the importance of work-up including assessing the upper and lower GU tract with CT urogram and cystoscopy if testing is positive - Cr 0.73 in 01/10/24

## 2024-01-22 NOTE — Assessment & Plan Note (Deleted)
-   POCT UA + heme, pending UA microscopy and culture - likely due to atrophy, Rx to start low dose vaginal estrogen

## 2024-01-22 NOTE — Assessment & Plan Note (Addendum)
-   high tone pelvic floor on exam - The origin of pelvic floor muscle spasm can be multifactorial, including primary, reactive to a different pain source, trauma, or even part of a centralized pain syndrome.Treatment options include pelvic floor physical therapy, local (vaginal) or oral  muscle relaxants, pelvic muscle trigger point injections or centrally acting pain medications.   - referral to pelvic floor PT and encouraged pelvic floor relaxation exercises - encouraged stress management

## 2024-01-22 NOTE — Progress Notes (Signed)
 New Patient Evaluation and Consultation  Referring Provider: Domenica Harlene LABOR, MD PCP: Domenica Harlene LABOR, MD Date of Service: 01/22/2024  SUBJECTIVE Chief Complaint: New Patient (Initial Visit) (Ruth Bell is a 63 y.o. female here today for urinary incontinence.)  History of Present Illness: Ruth Bell is a 63 y.o. Black or African-American female seen in consultation at the request of Dr Domenica for evaluation of urinary incontinence.    Urinary frequency, urgency, leakage with coughing/sneezing started during 3rd pregnancy and postpartum when she was 18. Started pad use. Nocturia 2-3x/night.  Worsened around 40s with increased urgency and leakage with 3 pads/day.  Underwent bladder procedure for urinary leakage by urology > 10 years ago in [place] at [facility], reports urgency and frequency with running water. Symptoms unchanged after surgery and discharged with foley catheter for 7 days. Reports postop visit with office procedure to burn something in the vagina  Now uses 5 pads/day around 2 months ago prior to hospitalization.  Attributed weight loss to stress due to several family members with recent hospitalization, cousin's passing, and care for  sister's wound with packing. Recent hospitalization 10/20-24/25 for COPD exacerbation, started Advair and prednisone  taper with Vantin. Recommended psych referral and follow-up with Dr. Georjean (neurology) Using nicotine  patches and stopped smoking. Previously 4 cigarettes/day Last visit with Dr. Domenica (PCP) 10/10/23, recommended hyoscyamine  for abdominal cramping and Consider SNRI like duloxetine if pain arises  Reports waking up around 2-4am recently, uses hydroxyzine  in the morning.  Microscopic hematuria diagnosis since 2016  Review of records significant for: Pseudoseizures on Keppra , migraine, constipation, COPD, PTSD  Urinary Symptoms: Leaks urine with cough/ sneeze in the daytime and urgency leakage at  night Leaks 1-2 time(s) per days with coughing/sneezing, small volume leakage Leaks 2-3x/day with urgency with larger volume leakage, similar symptoms 2-3 leaks/night Pad use: 5 adult diapers per day.   Patient is bothered by UI symptoms.  Day time voids 4.  Nocturia: 4 times per night to void with insomnia with bladder pressure and pain Stops drinking water around 8-9pm, sleeps around 9pm Denies snoring or sleep apnea, denies leg swelling Voiding dysfunction:  does not empty bladder well since surgery due to void immediately after voiding when she stands Patient does not use a catheter to empty bladder.  When urinating, patient feels dribbling after finishing Drinks: 48oz water per day, 8oz coffee, 16oz tea, 8oz orange juice, 8oz apple juice  UTIs: 0 UTI's in the last year.   Denies history of kidney or bladder stones, pyelonephritis, bladder cancer, and kidney cancer Prior negative testing for blood in urine on testing, reports testing recommended due to sister's hematuria diagnosis and history of brain cancer.  No results found for the last 90 days.   Pelvic Organ Prolapse Symptoms:                  Patient Denies a feeling of a bulge the vaginal area.   Patient Denies seeing a bulge.   Bowel Symptom: Bowel movements: 1 time(s) per day Stool consistency: hard, Type I stool  Straining: yes.  Splinting: yes.  Incomplete evacuation: yes.  Patient Denies accidental bowel leakage / fecal incontinence Bowel regimen: stool softener, tried prune juice in the past Last colonoscopy: Results polyps removed, pending repeat in 3 years [facility] Colonoscopy          Upcoming     Colonoscopy (Every 3 Years) Next due on 12/24/2026    12/24/2023  COLONOSCOPY   Only  the first 1 history entries have been loaded, but more history exists.                Sexual Function Sexually active: no.  Sexual orientation: Straight Pain with sex: No, 1 episode of pain after menopause managed by  lubrication  Pelvic Pain Admits to 7/10 pelvic pain Location: suprapubic  Pain occurs: during urination and when bladder is full Prior pain treatment: heat, Aleve, Voltaren  gel Improved by: heating pad and bowel movements, Voltaren  gel 1x/week, aleve Worsened by: during urination, full bladder   Past Medical History:  Past Medical History:  Diagnosis Date   Anemia    Anxiety    Arm skin lesion, left 09/05/2014   Constipation 12/27/2013   Depression    Dysphagia 04/15/2016   Eczema 08/22/2015   GERD (gastroesophageal reflux disease)    Hematuria 03/04/2016   History of shingles 01/22/2016   Hyperglycemia 01/15/2015   Hyperlipidemia, mixed 08/22/2015   Hypertension    Menometrorrhagia 2006   Migraine, unspecified, without mention of intractable migraine without mention of status migrainosus    Preventative health care 11/04/2016   Pseudoseizures    Seizures (HCC)    history of pseudoseizure disorder, last last seizure 3 wks, keppra  inc in dosage   Tubular adenoma of colon 05/2011   Uterine leiomyoma 2006     Past Surgical History:   Past Surgical History:  Procedure Laterality Date   APPENDECTOMY     BILATERAL SALPINGOOPHORECTOMY  04/06/2004   CARDIAC EVENT MONITOR  02/2016   Mostly sinus bradycardia and sinus rhythm.  Rare PVCs and PACs.  No arrhythmias.  Symptoms of chest pain and fluttering as well as passed out spell noted with normal sinus rhythm.   INCONTINENCE SURGERY     MASS EXCISION Left 06/02/2015   Procedure: EXCISION LEFT ARM MASS;  Surgeon: Deward Null III, MD;  Location: [facility];  Service: General;  Laterality: Left;   NM MYOVIEW  LTD  04/2016   Reached heart rate of 127 bpm with Lexiscan .  EF 60 to 65%.  LOW RISK.  No ischemia or infarction.   ROTATOR CUFF REPAIR     TOTAL LAPAROSCOPIC HYSTERECTOMY WITH BILATERAL SALPINGO OOPHORECTOMY  04/06/2004   TRANSTHORACIC ECHOCARDIOGRAM  01/2016   F 55-6%.  No R WMA.  GR 1 DD.  Normal valves.      Past OB/GYN History: OB History  Gravida Para Term Preterm AB Living  3 3 3   3   SAB IAB Ectopic Multiple Live Births      3    # Outcome Date GA Lbr Len/2nd Weight Sex Type Anes PTL Lv  3 Term     F Vag-Spont   DEC  2 Term     M Vag-Spont   LIV  1 Term     F Vag-Spont   LIV    Vaginal deliveries: largest infant 6lb, Reports 3rd pregnancy with neonatal demise due to brain swelling. Forceps/ Vacuum deliveries: Forceps with 2nd delivery and reports 12 stitches without complications, Cesarean section: 0 Menopausal: Yes, at age 24 due to laparoscopic hysterectomy, BSO for AUB-L, Denies vaginal bleeding since menopause Contraception: s/p TLH and menopause. Last pap smear was 2005 per documentation, no records for review.  Any history of abnormal pap smears: no. No results found for: DIAGPAP, HPVHIGH, ADEQPAP  Medications: Patient has a current medication list which includes the following prescription(s): acetaminophen , albuterol , amlodipine , ascorbic acid, desloratadine , [START ON 01/23/2024] estradiol, famotidine , fluticasone , fluticasone -salmeterol, guaifenesin , guaifenesin -codeine,  hydroxyzine , hyoscyamine , levetiracetam , metoprolol  succinate, nicotine , nitrofurantoin (macrocrystal-monohydrate), ondansetron , prednisone , and trospium.   Allergies: Patient has no known allergies.   Social History:  Social History   Tobacco Use   Smoking status: Former    Current packs/day: 0.50    Average packs/day: 0.5 packs/day for 40.3 years (20.1 ttl pk-yrs)    Types: Cigarettes    Start date: 12/18/2015   Smokeless tobacco: Never   Tobacco comments:    3-4 cigarettes daily  Vaping Use   Vaping status: Never Used  Substance Use Topics   Alcohol use: Yes    Comment: wine occassioanlly    Drug use: No    Relationship status: long-term partner Patient lives with spouse.   Patient is not employed. Regular exercise: Yes: walks/homecare History of abuse: No  Family History:    Family History  Problem Relation Age of Onset   Heart disease Mother    Heart attack Mother    Cancer Father        colon   Hypertension Father    Brain cancer Sister    Hypertension Sister    Cancer Sister        unsure of type   Diabetes Sister        type 2   Heart attack Sister    Cancer Brother        unknown type   Hypertension Brother    Colon cancer Neg Hx    Esophageal cancer Neg Hx    Pancreatic cancer Neg Hx    Stomach cancer Neg Hx    Colon polyps Neg Hx    Bladder Cancer Neg Hx    Renal cancer Neg Hx      Review of Systems: Review of Systems  Constitutional:  Positive for weight loss. Negative for fever and malaise/fatigue.  Respiratory:  Positive for shortness of breath. Negative for cough and wheezing.   Cardiovascular:  Positive for chest pain. Negative for palpitations and leg swelling.  Gastrointestinal:  Positive for abdominal pain and constipation. Negative for blood in stool.  Genitourinary:  Positive for dysuria and frequency. Negative for hematuria and urgency.       Leakage  Skin:  Negative for rash.  Neurological:  Positive for headaches. Negative for dizziness and weakness.  Endo/Heme/Allergies:  Does not bruise/bleed easily.       Hot flashes  Psychiatric/Behavioral:  Positive for depression. The patient is nervous/anxious.      OBJECTIVE Physical Exam: Vitals:   01/22/24 0924  BP: (!) 166/99  Pulse: 87  Weight: 99 lb 12.8 oz (45.3 kg)  Height: 5' 2.8 (1.595 m)    Physical Exam Constitutional:      General: She is not in acute distress.    Appearance: Normal appearance.  Genitourinary:     Bladder and urethral meatus normal.     No lesions in the vagina.     Right Labia: No rash, tenderness, lesions, skin changes or Bartholin's cyst.    Left Labia: No tenderness, lesions, skin changes, Bartholin's cyst or rash.    No vaginal discharge, erythema, tenderness, bleeding, ulceration or granulation tissue.     No vaginal prolapse  present.    Severe vaginal atrophy present.     Right Adnexa: not tender, not full and no mass present.    Left Adnexa: not tender, not full and no mass present.    Cervix is absent.     Uterus is absent.     Urethral meatus caruncle not present.  No urethral prolapse, tenderness, mass, hypermobility, discharge or stress urinary incontinence with cough stress test present.     Bladder is not tender, urgency on palpation not present and masses not present.      Pelvic Floor: Levator muscle strength is 5/5.    Levator ani is tender (R > L) and obturator internus is tender (R > L).     No asymmetrical contractions present and no pelvic spasms present.    Symmetrical pelvic sensation, anal wink present and BC reflex present. Cardiovascular:     Rate and Rhythm: Normal rate.  Pulmonary:     Effort: Pulmonary effort is normal. No respiratory distress.  Abdominal:     General: There is no distension.     Palpations: Abdomen is soft. There is no mass.     Tenderness: There is abdominal tenderness (generalized).     Hernia: No hernia is present.   Musculoskeletal:       Legs:  Neurological:     Mental Status: She is alert.  Vitals reviewed. Exam conducted with a chaperone present.      POP-Q:   POP-Q  -3                                            Aa   -3                                           Ba  -7                                              C   1                                            Gh  4                                            Pb  7                                            tvl   -3                                            Ap  -3                                            Bp  D     Post-Void Residual (PVR) by Bladder Scan: In order to evaluate bladder emptying, we discussed obtaining a postvoid residual and patient agreed to this procedure.  Procedure: The ultrasound unit was placed on the  patient's abdomen in the suprapubic region after the patient had voided.    Post Void Residual - 01/22/24 0946       Post Void Residual   Post Void Residual 3 mL         Straight Catheterization Procedure for PVR: After verbal consent was obtained from the patient for catheterization to assess bladder emptying and residual volume the urethra and surrounding tissues were prepped with betadine and an in and out catheterization was performed.  PVR was 20mL.  Urine appeared clear yellow. The patient tolerated the procedure well.   Laboratory Results: Lab Results  Component Value Date   COLORU yellow 01/22/2024   CLARITYU clear 01/22/2024   GLUCOSEUR negative 01/22/2024   BILIRUBINUR negative 01/22/2024   KETONESU neg 01/21/2024   SPECGRAV 1.020 01/22/2024   RBCUR small (A) 01/22/2024   PHUR 7.0 01/22/2024   PROTEINUR Negative 01/21/2024   UROBILINOGEN 0.2 01/22/2024   LEUKOCYTESUR Negative 01/22/2024    Lab Results  Component Value Date   CREATININE 0.73 01/10/2024   CREATININE 0.63 01/07/2024   CREATININE 0.89 01/06/2024    Lab Results  Component Value Date   HGBA1C 5.9 (H) 01/10/2024    Lab Results  Component Value Date   HGB 14.0 01/10/2024     ASSESSMENT AND PLAN Ms. Ruth Bell is a 63 y.o. with:  1. Nocturia   2. Other constipation   3. Urinary incontinence, mixed   4. Vaginal atrophy   5. Pelvic pain   6. Microscopic hematuria     Nocturia Assessment & Plan: - avoid fluid intake 3 hours before bedtime - reassess after trospium     Orders: -     POCT URINALYSIS DIP (CLINITEK) -     Trospium Chloride; Take 1 tablet (20 mg total) by mouth 2 (two) times daily.  Dispense: 60 tablet; Refill: 2  Other constipation Assessment & Plan: - For constipation, we reviewed the importance of a better bowel regimen.  We also discussed the importance of avoiding chronic straining, as it can exacerbate her pelvic floor symptoms; we discussed treating constipation and  straining prior to surgery, as postoperative straining can lead to damage to the repair and recurrence of symptoms. We discussed initiating therapy with increasing fluid intake, fiber supplementation, stool softeners, and laxatives such as miralax .  - encouraged squatting position for defecation to reduce straining and start titration of Citrucel - discussed association with pelvic floor disorders - referral to pelvic floor PT  Orders: -     AMB referral to rehabilitation  Urinary incontinence, mixed Assessment & Plan: - POCT + heme, pending UA microscopy and culture - We discussed the symptoms of overactive bladder (OAB), which include urinary urgency, urinary frequency, nocturia, with or without urge incontinence.  While we do not know the exact etiology of OAB, several treatment options exist. We discussed management including behavioral therapy (decreasing bladder irritants, urge suppression strategies, timed voids, bladder retraining), physical therapy, medication; for refractory cases posterior tibial nerve stimulation, sacral neuromodulation, and intravesical botulinum toxin injection.  For anticholinergic medications, we discussed the potential side effects of anticholinergics including dry eyes, dry mouth, constipation, cognitive impairment and urinary retention. For Beta-3 agonist medication, we discussed the potential side effect of elevated blood pressure which is more likely  to occur in individuals with uncontrolled hypertension. - Rx trospium after optimization of stool consistency - For treatment of stress urinary incontinence,  non-surgical options include expectant management, weight loss, physical therapy, as well as a pessary.  Surgical options include a midurethral sling, Burch urethropexy, and transurethral injection of a bulking agent. - referral to pelvic floor PT and Rx to start low dose vaginal estrogen  Orders: -     Trospium Chloride; Take 1 tablet (20 mg total) by mouth  2 (two) times daily.  Dispense: 60 tablet; Refill: 2 -     AMB referral to rehabilitation -     Estradiol; Place 0.5g nightly for two weeks then twice a week after  Dispense: 42.5 g; Refill: 3  Vaginal atrophy Assessment & Plan: - For symptomatic vaginal atrophy options include lubrication with a water-based lubricant, personal hygiene measures and barrier protection against wetness, and estrogen replacement in the form of vaginal cream, vaginal tablets, or a time-released vaginal ring.   - Rx to start low dose vaginal estrogen  Orders: -     Estradiol; Place 0.5g nightly for two weeks then twice a week after  Dispense: 42.5 g; Refill: 3  Pelvic pain Assessment & Plan: - high tone pelvic floor on exam - The origin of pelvic floor muscle spasm can be multifactorial, including primary, reactive to a different pain source, trauma, or even part of a centralized pain syndrome.Treatment options include pelvic floor physical therapy, local (vaginal) or oral  muscle relaxants, pelvic muscle trigger point injections or centrally acting pain medications.   - referral to pelvic floor PT and encouraged pelvic floor relaxation exercises - encouraged stress management  Orders: -     AMB referral to rehabilitation -     Estradiol; Place 0.5g nightly for two weeks then twice a week after  Dispense: 42.5 g; Refill: 3  Microscopic hematuria Assessment & Plan: - POCT UA + heme, pending UA microscopy and culture - likely due to atrophy, Rx to start low dose vaginal estrogen - patient denies prior workup - For management of microscopic hematuria, we discussed the importance of work-up including assessing the upper and lower GU tract with CT urogram and cystoscopy if testing is positive - Cr 0.73 in 01/10/24   Orders: -     POCT URINALYSIS DIP (CLINITEK) -     Urine Culture; Future -     Urinalysis, Complete w Microscopic; Future  Time spent: I spent 78 minutes dedicated to the care of this patient on  the date of this encounter to include pre-visit review of records, face-to-face time with the patient discussing mixed urinary incontinence, pelvic pain, microscopic hematuria, vaginal atrophy, nocturia, constipation, and post visit documentation and ordering medication/ testing.   Lianne ONEIDA Gillis, MD

## 2024-01-22 NOTE — Patient Instructions (Addendum)
 Constipation: Our goal is to achieve formed bowel movements daily or every-other-day.  You may need to try different combinations of the following options to find what works best for you - everybody's body works differently so feel free to adjust the dosages as needed.  Some options to help maintain bowel health include:  Dietary changes (more leafy greens, vegetables and fruits; less processed foods) Fiber supplementation (Benefiber, FiberCon, Metamucil or Psyllium). Start slow and increase gradually to full dose. Over-the-counter agents such as: stool softeners (Docusate or Colace) and/or laxatives (Miralax , milk of magnesia)  Power Pudding is a natural mixture that may help your constipation.  To make blend 1 cup applesauce, 1 cup wheat bran, and 3/4 cup prune juice, refrigerate and then take 1 tablespoon daily with a large glass of water as needed.   Women should try to eat at least 21 to 25 grams of fiber a day, while men should aim for 30 to 38 grams a day. You can add fiber to your diet with food or a fiber supplement such as psyllium (metamucil), benefiber, or fibercon.   Here's a look at how much dietary fiber is found in some common foods. When buying packaged foods, check the Nutrition Facts label for fiber content. It can vary among brands.  Fruits Serving size Total fiber (grams)*  Raspberries 1 cup 8.0  Pear 1 medium 5.5  Apple, with skin 1 medium 4.5  Banana 1 medium 3.0  Orange 1 medium 3.0  Strawberries 1 cup 3.0   Vegetables Serving size Total fiber (grams)*  Green peas, boiled 1 cup 9.0  Broccoli, boiled 1 cup chopped 5.0  Turnip greens, boiled 1 cup 5.0  Brussels sprouts, boiled 1 cup 4.0  Potato, with skin, baked 1 medium 4.0  Sweet corn, boiled 1 cup 3.5  Cauliflower, raw 1 cup chopped 2.0  Carrot, raw 1 medium 1.5   Grains Serving size Total fiber (grams)*  Spaghetti, whole-wheat, cooked 1 cup 6.0  Barley, pearled, cooked 1 cup 6.0  Bran flakes 3/4 cup 5.5   Quinoa, cooked 1 cup 5.0  Oat bran muffin 1 medium 5.0  Oatmeal, instant, cooked 1 cup 5.0  Popcorn, air-popped 3 cups 3.5  Brown rice, cooked 1 cup 3.5  Bread, whole-wheat 1 slice 2.0  Bread, rye 1 slice 2.0   Legumes, nuts and seeds Serving size Total fiber (grams)*  Split peas, boiled 1 cup 16.0  Lentils, boiled 1 cup 15.5  Black beans, boiled 1 cup 15.0  Baked beans, canned 1 cup 10.0  Chia seeds 1 ounce 10.0  Almonds 1 ounce (23 nuts) 3.5  Pistachios 1 ounce (49 nuts) 3.0  Sunflower kernels 1 ounce 3.0  *Rounded to nearest 0.5 gram. Source: [employer] for [employer], Legacy Release    For treatment of stress urinary incontinence, which is leakage with physical activity/movement/strainging/coughing, we discussed expectant management versus nonsurgical options versus surgery. Nonsurgical options include weight loss, physical therapy, as well as a pessary.  Surgical options include a midurethral sling, which is a synthetic mesh sling that acts like a hammock under the urethra to prevent leakage of urine, a Burch urethropexy, and transurethral injection of a bulking agent.   We discussed the symptoms of overactive bladder (OAB), which include urinary urgency, urinary frequency, night-time urination, with or without urge incontinence.  We discussed management including behavioral therapy (decreasing bladder irritants by following a bladder diet, urge suppression strategies, timed voids, bladder retraining), physical therapy, medication; and for refractory  cases posterior tibial nerve stimulation, sacral neuromodulation, and intravesical botulinum toxin injection.   For anticholinergic medications, we discussed the potential side effects of anticholinergics including dry eyes, dry mouth, constipation, rare risks of cognitive impairment and urinary retention. You were given prescription for Trospium 20mg  twice daily due to cost.  It can take a month to start  working so give it time, but if you have bothersome side effects call sooner and we can try a different medication.  Call us  if you have trouble filling the prescription or if it's not covered by your insurance.  For night time frequency: - avoid fluid intake 3 hours before bedtime - elevated your feet during the day or use compression socks to reduce lower extremity  The origin of pelvic floor muscle spasm can be multifactorial, including primary, reactive to a different pain source, trauma, or even part of a centralized pain syndrome.Treatment options include pelvic floor physical therapy, local (vaginal) or oral  muscle relaxants, pelvic muscle trigger point injections or centrally acting pain medications.     Please call [phone] to schedule the earliest appointment for pelvic floor PT.  For vaginal atrophy (thinning of the vaginal tissue that can cause dryness and burning) and UTI prevention we discussed estrogen replacement in the form of vaginal cream.   Start vaginal estrogen therapy nightly for two weeks then 2 times weekly at night. This can be placed with your finger or an applicator inside the vagina and around the urethra.  Please let us  know if the prescription is too expensive and we can look for alternative options.   Is vaginal estrogen therapy safe for me? Vaginal estrogen preparations act on the vaginal skin, and only a very tiny amount is absorbed into the bloodstream (0.01%).  They work in a similar way to hand or face cream.  There is minimal absorption and they are therefore perfectly safe. If you have had breast cancer and have persistent troublesome symptoms which aren't settling with vaginal moisturisers and lubricants, local estrogen treatment may be a possibility, but consultation with your oncologist should take place first.

## 2024-01-22 NOTE — Addendum Note (Signed)
 Addended by: Joaquina Nissen N on: 01/22/2024 04:27 PM   Modules accepted: Orders

## 2024-01-22 NOTE — Assessment & Plan Note (Signed)
-   POCT + heme, pending UA microscopy and culture - We discussed the symptoms of overactive bladder (OAB), which include urinary urgency, urinary frequency, nocturia, with or without urge incontinence.  While we do not know the exact etiology of OAB, several treatment options exist. We discussed management including behavioral therapy (decreasing bladder irritants, urge suppression strategies, timed voids, bladder retraining), physical therapy, medication; for refractory cases posterior tibial nerve stimulation, sacral neuromodulation, and intravesical botulinum toxin injection.  For anticholinergic medications, we discussed the potential side effects of anticholinergics including dry eyes, dry mouth, constipation, cognitive impairment and urinary retention. For Beta-3 agonist medication, we discussed the potential side effect of elevated blood pressure which is more likely to occur in individuals with uncontrolled hypertension. - Rx trospium after optimization of stool consistency - For treatment of stress urinary incontinence,  non-surgical options include expectant management, weight loss, physical therapy, as well as a pessary.  Surgical options include a midurethral sling, Burch urethropexy, and transurethral injection of a bulking agent. - referral to pelvic floor PT and Rx to start low dose vaginal estrogen

## 2024-01-22 NOTE — Assessment & Plan Note (Signed)
-   For symptomatic vaginal atrophy options include lubrication with a water-based lubricant, personal hygiene measures and barrier protection against wetness, and estrogen replacement in the form of vaginal cream, vaginal tablets, or a time-released vaginal ring.   - Rx to start low dose vaginal estrogen

## 2024-01-22 NOTE — Addendum Note (Signed)
 Addended by: ESTELLE GILLIS D on: 01/22/2024 12:59 PM   Modules accepted: Orders

## 2024-01-22 NOTE — Progress Notes (Signed)
 Care Guide Pharmacy Note  01/22/2024 Name: ANABELLE BUNGERT MRN: [id number] DOB: 1960/09/19  Referred By: Domenica Harlene LABOR, MD Reason for referral: Complex Care Management (Outreach to schedule referral with pharmacist )   AMELI SANGIOVANNI is a 63 y.o. year old female who is a primary care patient of Domenica Harlene LABOR, MD.  Naomie JINNY Cook was referred to the pharmacist for assistance related to: tobacco cessation   Successful contact was made with the patient to discuss pharmacy services including being ready for the pharmacist to call at least 5 minutes before the scheduled appointment time and to have medication bottles and any blood pressure readings ready for review. The patient agreed to meet with the pharmacist via telephone visit on 01/27/2024  Thedford Franks, CMA [place]  [facility], [facility] Guide Direct Dial: [phone]  Fax: [phone] Website: [email or web address]

## 2024-01-22 NOTE — Addendum Note (Signed)
 Addended by: ESTELLE GILLIS D on: 01/22/2024 01:07 PM   Modules accepted: Orders

## 2024-01-23 ENCOUNTER — Ambulatory Visit: Payer: Self-pay | Admitting: Student

## 2024-01-23 LAB — URINE CULTURE
Culture: <10000 — AB
MICRO NUMBER:: 17194305
Result:: NO GROWTH
SPECIMEN QUALITY:: ADEQUATE

## 2024-01-24 ENCOUNTER — Other Ambulatory Visit: Payer: Self-pay

## 2024-01-24 NOTE — Patient Instructions (Signed)
 Visit Information  Thank you for taking time to visit with me today. Please don't hesitate to contact me if I can be of assistance to you before our next scheduled telephone appointment.  Our next appointment is by telephone on Friday November 14th at 10:00am  Following is a copy of your care plan:   Goals Addressed             This Visit's Progress    VBCI Transitions of Care (TOC) Care Plan       Problems: (reviewed 01/24/24) Recent Hospitalization for treatment of COPD Medication access barrier Difficulty with access to Nicotine  patch due to insurance declining and the patient has limited funds  Goal: (reviewed 01/24/24) Over the next 30 days, the patient will not experience hospital readmission  Interventions: (reviewed 01/24/24)  COPD Interventions: Advised patient to track and manage COPD triggers Discussed the importance of adequate rest and management of fatigue with COPD Provided education about and advised patient to utilize infection prevention strategies to reduce risk of respiratory infection Provided instruction about proper use of medications used for management of COPD including inhalers Smoking Cessation - Referral to Pharmacy for assistance - 01/24/24 - The patient is wearing the 7mg  nicotine  patch Take new medication Advair disc daily as directed Take PRN Hydralizine for anxiety when SOB - The provider has changed this to twice daily and not PRN Recommendation to PCP for long term anit-anxiety med - The patient has a Psychiatry appointment on 01/27/24 01/24/24 - The patient is asking for continuation of antibiotics due to yellow sputum that is getting darker. Pulmonolgy appointment 01/29/24. CM to contact the provider  Incontinence:  Trospium Chloride 20mg  twice daily Estradiol .5mg  vaginal suppository Pelvic Floor Physical Therapy Change pad/Depends and keep peri area clean and dry to avoid UTI's  Patient Self Care Activities:  Attend all scheduled provider  appointments Call pharmacy for medication refills 3-7 days in advance of running out of medications Call provider office for new concerns or questions  Notify RN Care Manager of [facility] call rescheduling needs Participate in Transition of Care Program/Attend [facility] scheduled calls Perform all self care activities independently  Take medications as prescribed   Follow up with Psychiatry - scheduled for 01/27/24  Plan:  Telephone follow up appointment with care management team member scheduled for:  Friday November 14th at 10:00am        Patient verbalizes understanding of instructions and care plan provided today and agrees to view in MyChart. Active MyChart status and patient understanding of how to access instructions and care plan via MyChart confirmed with patient.     The patient has been provided with contact information for the care management team and has been advised to call with any health related questions or concerns.   Please call the care guide team at [phone] if you need to cancel or reschedule your appointment.   Please call the Suicide and Crisis Lifeline: 988 call the [place]  National Suicide Prevention Lifeline: 510-742-7455 or TTY: 7374237842 TTY 8251763876) to talk to a trained counselor if you are experiencing a Mental Health or Behavioral Health Crisis or need someone to talk to.  Medford Balboa, BSN, RN [place]  VBCI - [employer] [facility] RN Care Manager [phone]

## 2024-01-24 NOTE — Transitions of Care (Post Inpatient/ED Visit) (Signed)
 Transition of Care week 3  Visit Note  01/24/2024  Name: Ruth Bell MRN: [id number]          DOB: April 26, 1960  Situation: Patient enrolled in [facility] 30-day program. Visit completed with Naomie Cook by telephone.   Background:   Initial Transition Care Management Follow-up Telephone Call Discharge Date and Diagnosis: 01/10/24, COPD   Past Medical History:  Diagnosis Date   Anemia    Anxiety    Arm skin lesion, left 09/05/2014   Constipation 12/27/2013   Depression    Dysphagia 04/15/2016   Eczema 08/22/2015   GERD (gastroesophageal reflux disease)    Hematuria 03/04/2016   History of shingles 01/22/2016   Hyperglycemia 01/15/2015   Hyperlipidemia, mixed 08/22/2015   Hypertension    Menometrorrhagia 2006   Migraine, unspecified, without mention of intractable migraine without mention of status migrainosus    Preventative health care 11/04/2016   Pseudoseizures    Seizures (HCC)    history of pseudoseizure disorder, last last seizure 3 wks, keppra  inc in dosage   Tubular adenoma of colon 05/2011   Uterine leiomyoma 2006    Assessment: Patient Reported Symptoms: Cognitive Cognitive Status: Alert and oriented to person, place, and time, Normal speech and language skills      Neurological Neurological Review of Symptoms: Headaches    HEENT HEENT Symptoms Reported: Sore throat HEENT Management Strategies: Medication therapy, Adequate rest HEENT Comment: The patient complains of severe sore throat. Discussed lozenges, sprays, cool foods like ice cream and popsicles. Unknown etiology    Cardiovascular Cardiovascular Symptoms Reported: No symptoms reported Cardiovascular Management Strategies: Medication therapy, Routine screening, Adequate rest Weight: 99 lb (44.9 kg)  Respiratory Respiratory Symptoms Reported: Productive cough Additional Respiratory Details: The patient has a productive cough and states her sputum is gettind darker in color and is now a gray and yellow  color. She has an appointment with the Pulmonologist on 01/29/24. CM to contact PCP for abx until she sees Pulm. The patient has stopped smoking and is wearing a nicotine  patch Respiratory Management Strategies: Medication therapy, Adequate rest, Coping strategies  Endocrine Endocrine Symptoms Reported: No symptoms reported    Gastrointestinal Gastrointestinal Symptoms Reported: Unintentional weight loss Gastrointestinal Comment: The patient is drinking Ensure and trying to eat more soft foods with calories like ice cream    Genitourinary Genitourinary Symptoms Reported: Incontinence Genitourinary Management Strategies: Incontinence garment/pad Genitourinary Comment: The patient can go through as many as 56 pads,depends in a day. Went to see GYN who has placed her medication and is recommending Pelvic floor PT  Integumentary Integumentary Symptoms Reported: No symptoms reported    Musculoskeletal Musculoskelatal Symptoms Reviewed: Weakness        Psychosocial Psychosocial Symptoms Reported: Anxiety - if selected complete GAD         There were no vitals filed for this visit.  Medications Reviewed Today     Reviewed by Moises Reusing, RN (Case Manager) on 01/24/24 at 1137  Med List Status: <None>   Medication Order Taking? Sig Documenting Provider Last Dose Status Informant  acetaminophen  (TYLENOL ) 500 MG tablet [id number]  Take 1,000 mg by mouth every 6 (six) hours as needed for mild pain (pain score 1-3). [provider]  Active Self, Pharmacy Records  albuterol  (VENTOLIN  HFA) 108 (90 Base) MCG/ACT inhaler [id number]  Inhale 1-2 puffs into the lungs every 4 (four) hours as needed for wheezing or shortness of breath. Rai, Nydia POUR, MD  Active   amLODipine  (NORVASC ) 5 MG tablet [id number]  Take 1 tablet (5 mg total) by mouth daily. Rai, Nydia POUR, MD  Active   ascorbic acid (VITAMIN C) 100 MG tablet [id number]  Take 1 tablet by mouth daily. [provider]  Active  Self, Pharmacy Records  desloratadine  (CLARINEX ) 5 MG tablet [id number]  Take 1 tablet (5 mg total) by mouth daily. Soldatova, Liuba, MD  Active Self, Pharmacy Records  estradiol (ESTRACE) 0.01 % CREA vaginal cream [id number]  Place 0.5g nightly for two weeks then twice a week after Guadlupe Lianne DASEN, MD  Active   famotidine  (PEPCID ) 20 MG tablet [id number]  TAKE 1 TABLET BY MOUTH TWICE A DAY Soldatova, Liuba, MD  Active Self, Pharmacy Records  fluticasone  (FLONASE ) 50 MCG/ACT nasal spray [id number]  Place 2 sprays into both nostrils daily. Rai, Nydia POUR, MD  Active   fluticasone -salmeterol (ADVAIR DISKUS) 250-50 MCG/ACT AEPB [id number]  Inhale 1 puff into the lungs 2 (two) times daily. Rai, Nydia POUR, MD  Active   guaiFENesin  (MUCINEX ) 600 MG 12 hr tablet [id number]  Take 1 tablet (600 mg total) by mouth 2 (two) times daily. Rai, Nydia POUR, MD  Active   guaiFENesin -codeine 100-10 MG/5ML syrup [id number]  Take 5 mLs by mouth every 6 (six) hours as needed for cough. Rai, Nydia POUR, MD  Active   hydrOXYzine  (ATARAX ) 10 MG tablet [id number] Yes Take 1 tablet (10 mg total) by mouth every 8 (eight) hours as needed for anxiety.  Patient taking differently: Take 10 mg by mouth 2 (two) times daily.   Rai, Nydia POUR, MD  Active   hyoscyamine  (ANASPAZ ) 0.125 MG TBDP disintergrating tablet [id number]  Place 1 tablet (0.125 mg total) under the tongue every 4 (four) hours as needed. Domenica Harlene LABOR, MD  Active Self, Pharmacy Records  levETIRAcetam  (KEPPRA ) 500 MG tablet [id number]  Take 1 tablet (500 mg total) by mouth 2 (two) times daily. Yacopino, Jessica L, NP  Active   metoprolol  succinate (TOPROL -XL) 100 MG 24 hr tablet [id number]  Take 1 tablet (100 mg total) by mouth 2 (two) times daily. Take with or immediately following a meal. Domenica Harlene LABOR, MD  Active Self, Pharmacy Records  nicotine  (NICODERM CQ  - DOSED IN MG/24 HOURS) 21 mg/24hr patch [id number]  Place 1 patch (21 mg total) onto the skin daily.  Patient not  taking: Reported on 01/24/2024   Rai, Nydia POUR, MD  Active   nitrofurantoin, macrocrystal-monohydrate, (MACROBID) 100 MG capsule [id number]  Take 1 capsule (100 mg total) by mouth 2 (two) times daily for 5 days. Wheeler Harlene CROME, NP  Active   ondansetron  (ZOFRAN ) 4 MG tablet [id number]  Take 1 tablet (4 mg total) by mouth every 8 (eight) hours as needed for nausea or vomiting. Rai, Nydia POUR, MD  Active   predniSONE  (DELTASONE ) 10 MG tablet [id number]  Prednisone  dosing: Take  Prednisone  40mg  (4 tabs) x 3 days, then taper to 30mg  (3 tabs) x 3 days, then 20mg  (2 tabs) x 3days, then 10mg  (1 tab) x 3days, then OFF. Rai, Nydia POUR, MD  Active   trospium (SANCTURA) 20 MG tablet [id number]  Take 1 tablet (20 mg total) by mouth 2 (two) times daily. Guadlupe Lianne DASEN, MD  Active             Recommendation:   Specialty provider follow-up Psychiatry on 01/27/24 and Pulmonology on 01/29/24 Continue Current Plan of Care  Follow Up Plan:   Telephone follow-up in 1 week  Medford Balboa, BSN, RN [place]  VBCI -  Population Health RN Care Manager [phone]

## 2024-01-27 ENCOUNTER — Telehealth: Payer: Self-pay | Admitting: Pharmacist

## 2024-01-27 ENCOUNTER — Encounter: Payer: Self-pay | Admitting: Neurology

## 2024-01-27 ENCOUNTER — Other Ambulatory Visit

## 2024-01-27 NOTE — Telephone Encounter (Signed)
 Tried to call patient twice for initial outreach by Clinical Pharmacist Practitioner. Unable to reach patient and unable to leave message - voicemail box has not been set up on either of her phone numbers.  Will try to out reach patient again later this week.

## 2024-01-27 NOTE — Progress Notes (Unsigned)
   01/27/2024 Name: AUBRIELLE STROUD MRN: [id number] DOB: 04/15/1960  No chief complaint on file.   MAIANA HENNIGAN is a 63 y.o. year old female who presented for a telephone visit.   They were referred to the pharmacist by the VBCI Transitions of Care program for assistance in managing complex medication management and smoking cessation.    Subjective:  Care Team: Primary Care Provider: Domenica Harlene LABOR, MD ; Next Scheduled Visit: *** {careteamprovider:27366}  Medication Access/Adherence  Current Pharmacy:  [id number] [place], [place] - [address] [place] [address] Phone: [phone] Fax: [phone]  [facility] - [place], [place] - [address] [facility] [address] [place] [address] [place] [address] Phone: [phone] Fax: [phone]  [facility] - [facility] [address] [place] [address] Phone: [phone] Fax: [phone]  [facility] - [facility] [address] [place] [address] Phone: [phone] Fax: [phone]  [facility] - [place], [place] - [address] [place] BROCKS [place] [address] Phone: [phone] Fax: [phone]   Patient reports affordability concerns with their medications: {YES/NO:21197} Patient reports access/transportation concerns to their pharmacy: {YES/NO:21197} Patient reports adherence concerns with their medications:  {YES/NO:21197} ***   Tobacco Abuse:  Tobacco Use History: Age when started using tobacco on a daily basis *** Number of cigarettes per day *** Smokes first cigarette *** minutes after waking {Does/does not:3044014::Does,Does not} wake at night to smoke Triggers include   Quit Attempt History: Most recent quit attempt *** Longest time ever been tobacco free *** Methods tried in the past include {CHL  AMB [facility] MEDICATIONS FOR SMOKING CESSATION:20759}.  Rates IMPORTANCE of quitting tobacco on 1-10 scale of ***. Rates READINESS of quitting tobacco on 1-10 scale of ***. Rates CONFIDENCE of quitting tobacco on 1-10 scale of ***. Motivators to quitting include ***; barriers include {smoking cessation barriers:18118}   Current medication access support: ***    Medication Management:  Current adherence strategy: ***  Patient reports {Good Fair Poor:[phone]} adherence to medications  Patient reports the following barriers to adherence: ***  Recent fill dates:    Objective:  Lab Results  Component Value Date   HGBA1C 5.9 (H) 01/10/2024    Lab Results  Component Value Date   CREATININE 0.50 01/22/2024   BUN 13 01/22/2024   NA 139 01/22/2024   K 3.6 01/22/2024   CL 102 01/22/2024   CO2 28 01/22/2024    Lab Results  Component Value Date   CHOL 208 (H) 10/10/2023   HDL 86.20 10/10/2023   LDLCALC 109 (H) 10/10/2023   TRIG 66.0 10/10/2023   CHOLHDL 2 10/10/2023    Medications Reviewed Today   Medications were not reviewed in this encounter       Assessment/Plan:   {Pharmacy A/P Choices:26421}  Follow Up Plan: ***  ***

## 2024-01-29 ENCOUNTER — Ambulatory Visit

## 2024-01-30 ENCOUNTER — Other Ambulatory Visit: Payer: Self-pay | Admitting: Pharmacist

## 2024-01-30 DIAGNOSIS — Z720 Tobacco use: Secondary | ICD-10-CM

## 2024-01-30 MED ORDER — METOPROLOL SUCCINATE ER 100 MG PO TB24: 100.0000 mg | ORAL_TABLET | Freq: Two times a day (BID) | ORAL | 0 refills | Status: AC

## 2024-01-30 MED ORDER — AMLODIPINE BESYLATE 5 MG PO TABS: 5.0000 mg | ORAL_TABLET | Freq: Every day | ORAL | 0 refills | Status: AC

## 2024-01-30 MED ORDER — FLUTICASONE-SALMETEROL 250-50 MCG/ACT IN AEPB: 1.0000 | INHALATION_SPRAY | Freq: Two times a day (BID) | RESPIRATORY_TRACT | 2 refills | Status: DC

## 2024-01-30 MED ORDER — DESLORATADINE 5 MG PO TABS: 5.0000 mg | ORAL_TABLET | Freq: Every day | ORAL | 0 refills | Status: DC

## 2024-01-30 NOTE — Progress Notes (Signed)
 01/30/2024 Name: Ruth Bell MRN: [id number] DOB: 05-23-1960  Chief Complaint  Patient presents with   Medication Adherence   Medication Management   Nicotine  Dependence    Ruth QUIZHPI is a 63 y.o. year old female who presented for a telephone visit.   They were referred to the pharmacist by the [facility] Transitions of Care program for assistance in managing complex medication management and smoking cessation.    Subjective:  Care Team: Primary Care Provider: Domenica Harlene LABOR, MD ; Next Scheduled Visit: 02/10/2024 Neurologist: Dr Georjean; Next Scheduled Visit: 03/25/2024 Urologist: Dr Guadlupe: next Scheduled Visit: 04/23/2024 Pelvic PT - planned to start 04/07/2024  Medication Access/Adherence  Current Pharmacy:  [id number] [place], [place] - [address] [place] [address] Phone: [phone] Fax: [phone]  [facility]) - [place] - [address] [facility] [address] [place] [address] [place] [address] Phone: [phone] Fax: [phone]  [facility] - [facility] [address] [place] [address] Phone: [phone] Fax: [phone]  [facility] - [facility] [address] [place] [address] Phone: [phone] Fax: [phone]  [facility] - [place], [place] - [address] [place] [address] Phone: [phone] Fax: [phone]   Patient reports affordability concerns with their medications: Yes  Patient reports access/transportation concerns to their pharmacy: No  Patient reports adherence concerns with their medications:  Yes   - cost and Rx not available  Patient needs the following medications filled trospium, amlodipine , Advair, metoprolol  and desloratidine. Looks [facility]. Patient report she was not aware of trospium Rx being sent to outside pharmacy. Likely was done for cost reasons.    Tobacco Abuse:  Tobacco Use History: Age when started using tobacco on a daily basis 63 years old Number of cigarettes per day 4 Smokes first cigarette 9am minutes after waking Does wake at night to smoke Triggers include - eat breakfast or drinking anything  Quit Attempt History: Most recent quit attempt: none Methods tried in the past include cold turkey.  She has started wearing Nicotien 7mg  patches but states the cost was high.  Rates IMPORTANCE of quitting tobacco on 1-10 scale of 10. Rates READINESS of quitting tobacco on 1-10 scale of 10. Motivators to quitting include being able to breath; barriers include none per patient   Current medication access support: none  Medication Management:  Current adherence strategy: patient manages medications her self  Patient reports Fair adherence to medications  Patient reports the following barriers to adherence:  Multiple comorbidities Complex medication regimen Lack of care coordination between multiple providers Lack of coverage for prescribed medications / under insured   Objective:  Lab Results  Component Value Date   HGBA1C 5.9 (H) 01/10/2024    Lab Results  Component Value Date   CREATININE 0.50 01/22/2024   BUN 13 01/22/2024   NA 139 01/22/2024   K 3.6 01/22/2024   CL 102 01/22/2024   CO2 28 01/22/2024    Lab Results  Component Value Date   CHOL 208 (H) 10/10/2023   HDL 86.20 10/10/2023   LDLCALC 109 (H) 10/10/2023   TRIG 66.0 10/10/2023   CHOLHDL 2 10/10/2023    Medications Reviewed Today     Reviewed by Carla,  Vedant Shehadeh, RPH-CPP (Pharmacist) on 01/30/24 at 1028  Med List Status: <None>   Medication Order Taking? Sig Documenting Provider Last Dose Status Informant  acetaminophen  (TYLENOL ) 500 MG tablet [id number] Yes Take 1,000 mg by mouth every 6 (six) hours as needed  for mild pain (pain score 1-3). [provider]  Active Self, Pharmacy Records  albuterol  (VENTOLIN  HFA) 108 [phone] Base) MCG/ACT inhaler [id number] Yes Inhale 1-2 puffs into the lungs every 4 (four) hours as needed for wheezing or shortness of breath. Rai, Nydia POUR, MD  Active   amLODipine  (NORVASC ) 5 MG tablet [id number] Yes Take 1 tablet (5 mg total) by mouth daily. Rai, Nydia POUR, MD  Active   ascorbic acid (VITAMIN C) 100 MG tablet [id number] Yes Take 1 tablet by mouth daily.  Patient taking differently: Take 1,000 mg by mouth every other day.   [provider]  Active Self, Pharmacy Records  desloratadine  (CLARINEX ) 5 MG tablet [id number]  Take 1 tablet (5 mg total) by mouth daily.  Patient not taking: Reported on 01/30/2024   Soldatova, Liuba, MD  Active Self, Pharmacy Records  estradiol (ESTRACE) 0.01 % CREA vaginal cream [id number] Yes Place 0.5g nightly for two weeks then twice a week after Guadlupe Lianne DASEN, MD  Active   famotidine  (PEPCID ) 20 MG tablet [id number] Yes TAKE 1 TABLET BY MOUTH TWICE A DAY Soldatova, Liuba, MD  Active Self, Pharmacy Records  fluticasone  (FLONASE ) 50 MCG/ACT nasal spray [id number] Yes Place 2 sprays into both nostrils daily. Rai, Nydia POUR, MD  Active   fluticasone -salmeterol (ADVAIR DISKUS) 250-50 MCG/ACT AEPB [id number] Yes Inhale 1 puff into the lungs 2 (two) times daily. Rai, Nydia POUR, MD  Active   guaiFENesin  (MUCINEX ) 600 MG 12 hr tablet [id number]  Take 1 tablet (600 mg total) by mouth 2 (two) times daily.  Patient not taking: Reported on 01/30/2024   Davia Nydia POUR, MD  Active   guaiFENesin -codeine 100-10 MG/5ML syrup [id number] Yes Take 5 mLs by mouth every 6 (six) hours as needed for cough. Rai, Nydia POUR, MD  Active   hydrOXYzine  (ATARAX ) 10 MG tablet [id number]  Take 1 tablet (10 mg total) by mouth every 8 (eight) hours as needed for anxiety.  Patient not taking: Reported on 01/30/2024   Rai, Nydia POUR, MD  Active   hyoscyamine  (ANASPAZ )  0.125 MG TBDP disintergrating tablet [id number]  Place 1 tablet (0.125 mg total) under the tongue every 4 (four) hours as needed.  Patient not taking: Reported on 01/30/2024   Domenica Harlene LABOR, MD  Active Self, Pharmacy Records  levETIRAcetam  (KEPPRA ) 500 MG tablet [id number] Yes Take 1 tablet (500 mg total) by mouth 2 (two) times daily. Yacopino, Jessica L, NP  Active   metoprolol  succinate (TOPROL -XL) 100 MG 24 hr tablet [id number] Yes Take 1 tablet (100 mg total) by mouth 2 (two) times daily. Take with or immediately following a meal. Domenica Harlene LABOR, MD  Active Self, Pharmacy Records   Patient taking differently:   Discontinued 01/30/24 1002 (Dose change)   ondansetron  (ZOFRAN ) 4 MG tablet [id number] Yes Take 1 tablet (4 mg total) by mouth every 8 (eight) hours as needed for nausea or vomiting. Davia Nydia POUR, MD  Active   OVER THE COUNTER MEDICATION [id number] Yes MediKisk Lung Cleansng Spray - spray into mouth as needed. Contains - eucalyptus, peppermint, , licorice root, honeysuckle and calendula. [provider]  Active     Discontinued 01/30/24 1022 (Completed Course)   trospium (SANCTURA) 20 MG tablet  [id number]  Take 1 tablet (20 mg total) by mouth 2 (two) times daily.  Patient not taking: Reported on 01/30/2024   Guadlupe Lianne DASEN, MD  Active               Assessment/Plan:   Tobacco Abuse: Currently in the action phase of smoking cessation - Provided motivational interviewing to assess tobacco use and strategies for reduction - Provided information on 1 800 QUIT NOW support program. She might be able to get nicotoine patches, gum or lozenges from this program at no cost.  - Continue nicotine  patch 7 mg daily - Patient was also interested in take oral therapy for smoking cessation. Buproprion is contraindicated due to her history of seizures. Chantix or generic varenicline is another options but cost could be about $75 per month.  - Reviewed smoking triggers and how to avoid  triggers and plan for how to handle cravings.    Medication Management: - Currently strategy insufficient to maintain appropriate adherence to prescribed medication regimen - reviewed med list and refill history.  - [facility] - cost was $55 for #60. [facility] $29.39. [facility] $14.03;    Meds ordered this encounter  Medications   amLODipine  (NORVASC ) 5 MG tablet    Sig: Take 1 tablet (5 mg total) by mouth daily.    Dispense:  90 tablet    Refill:  0   desloratadine  (CLARINEX ) 5 MG tablet    Sig: Take 1 tablet (5 mg total) by mouth daily.    Dispense:  90 tablet    Refill:  0   fluticasone -salmeterol (ADVAIR DISKUS) 250-50 MCG/ACT AEPB    Sig: Inhale 1 puff into the lungs 2 (two) times daily.    Dispense:  60 each    Refill:  2   metoprolol  succinate (TOPROL -XL) 100 MG 24 hr tablet    Sig: Take 1 tablet (100 mg total) by mouth 2 (two) times daily. Take with or immediately following a meal.    Dispense:  180 tablet    Refill:  0     Follow Up Plan: 2 to 4 weeks.  Madelin Ray, PharmD Clinical Pharmacist [facility]  Population Health [phone]

## 2024-01-31 ENCOUNTER — Other Ambulatory Visit: Payer: Self-pay

## 2024-01-31 NOTE — Transitions of Care (Post Inpatient/ED Visit) (Signed)
 Transition of Care week 4  Visit Note  01/31/2024  Name: Ruth Bell MRN: [id number]          DOB: 05-17-1960  Situation: Patient enrolled in [facility] 30-day program. Visit completed with Naomie Cook by telephone.   Background:   Initial Transition Care Management Follow-up Telephone Call Discharge Date and Diagnosis: 01/10/24, COPD   Past Medical History:  Diagnosis Date   Anemia    Anxiety    Arm skin lesion, left 09/05/2014   Constipation 12/27/2013   Depression    Dysphagia 04/15/2016   Eczema 08/22/2015   GERD (gastroesophageal reflux disease)    Hematuria 03/04/2016   History of shingles 01/22/2016   Hyperglycemia 01/15/2015   Hyperlipidemia, mixed 08/22/2015   Hypertension    Menometrorrhagia 2006   Migraine, unspecified, without mention of intractable migraine without mention of status migrainosus    Preventative health care 11/04/2016   Pseudoseizures    Seizures (HCC)    history of pseudoseizure disorder, last last seizure 3 wks, keppra  inc in dosage   Tubular adenoma of colon 05/2011   Uterine leiomyoma 2006    Assessment: Patient Reported Symptoms: Cognitive Cognitive Status: Alert and oriented to person, place, and time, Normal speech and language skills      Neurological Neurological Review of Symptoms: Headaches Neurological Comment: They come and go.  HEENT HEENT Symptoms Reported: Mouth dryness, Sore throat HEENT Management Strategies: Medication therapy, Fluid modification, Adequate rest HEENT Comment: The pateint continues with hoarseness in her voice. She states she is coughing up gray colored sputum and she is wheezing and SOB at times. Provider notified    Cardiovascular Cardiovascular Symptoms Reported: No symptoms reported Does patient have uncontrolled Hypertension?: Yes Is patient checking Blood Pressure at home?: Yes Patient's Recent BP reading at home: 159/90 Cardiovascular Management Strategies: Medication therapy, Routine screening,  Activity Weight: 99 lb (44.9 kg) Cardiovascular Comment: Contacted the proider regarding elevated BP  Respiratory Respiratory Symptoms Reported: Productive cough Additional Respiratory Details: The pateint states shehas quit smoking through the useof the Nicotine  patch but she is still SOB and when she walks shehas to sit down and rest. She is coughing up gray sputum and she states she doesn't fee much better since leaving the hospital. She thinks oxygen may be helpful for her Provider notified Respiratory Management Strategies: Medication therapy, Adequate rest, Coping strategies  Endocrine Endocrine Symptoms Reported: No symptoms reported Is patient diabetic?: No    Gastrointestinal Gastrointestinal Symptoms Reported: Unintentional weight loss Gastrointestinal Management Strategies: Fluid modification Gastrointestinal Comment: The patient is drinking Ensure to try and get more calories    Genitourinary Genitourinary Symptoms Reported: Incontinence Genitourinary Management Strategies: Incontinence garment/pad Genitourinary Comment: The patient states she is doing her Pelvic floor rehab but she doesn't notice much change yet  Integumentary Integumentary Symptoms Reported: No symptoms reported    Musculoskeletal Musculoskelatal Symptoms Reviewed: Weakness        Psychosocial Psychosocial Symptoms Reported: Not assessed         There were no vitals filed for this visit.    Medications Reviewed Today     Reviewed by Moises Reusing, RN (Case Manager) on 01/31/24 at 1029  Med List Status: <None>   Medication Order Taking? Sig Documenting Provider Last Dose Status Informant  acetaminophen  (TYLENOL ) 500 MG tablet [id number]  Take 1,000 mg by mouth every 6 (six) hours as needed for mild pain (pain score 1-3). [provider]  Active Self, Pharmacy Records  albuterol  (VENTOLIN  HFA) 108 [phone] Base)  MCG/ACT inhaler [id number]  Inhale 1-2 puffs into the lungs every 4 (four) hours as  needed for wheezing or shortness of breath. Rai, Nydia POUR, MD  Active   amLODipine  (NORVASC ) 5 MG tablet [id number]  Take 1 tablet (5 mg total) by mouth daily. Domenica Harlene LABOR, MD  Active   ascorbic acid (VITAMIN C) 100 MG tablet [id number]  Take 1 tablet by mouth daily.  Patient taking differently: Take 1,000 mg by mouth every other day.   [provider]  Active Self, Pharmacy Records  desloratadine  (CLARINEX ) 5 MG tablet [id number]  Take 1 tablet (5 mg total) by mouth daily. Domenica Harlene LABOR, MD  Active   estradiol (ESTRACE) 0.01 % CREA vaginal cream [id number]  Place 0.5g nightly for two weeks then twice a week after Guadlupe Lianne DASEN, MD  Active   famotidine  (PEPCID ) 20 MG tablet [id number]  TAKE 1 TABLET BY MOUTH TWICE A DAY Soldatova, Liuba, MD  Active Self, Pharmacy Records  fluticasone  (FLONASE ) 50 MCG/ACT nasal spray [id number]  Place 2 sprays into both nostrils daily. Rai, Nydia POUR, MD  Active   fluticasone -salmeterol (ADVAIR DISKUS) 250-50 MCG/ACT AEPB [id number]  Inhale 1 puff into the lungs 2 (two) times daily. Domenica Harlene LABOR, MD  Active   guaiFENesin  (MUCINEX ) 600 MG 12 hr tablet [id number]  Take 1 tablet (600 mg total) by mouth 2 (two) times daily.  Patient not taking: Reported on 01/30/2024   Davia Nydia POUR, MD  Active   guaiFENesin -codeine 100-10 MG/5ML syrup [id number]  Take 5 mLs by mouth every 6 (six) hours as needed for cough. Rai, Nydia POUR, MD  Active   hydrOXYzine  (ATARAX ) 10 MG tablet [id number]  Take 1 tablet (10 mg total) by mouth every 8 (eight) hours as needed for anxiety.  Patient not taking: Reported on 01/30/2024   Rai, Nydia POUR, MD  Active   hyoscyamine  (ANASPAZ ) 0.125 MG TBDP disintergrating tablet [id number]  Place 1 tablet (0.125 mg total) under the tongue every 4 (four) hours as needed.  Patient not taking: Reported on 01/30/2024   Domenica Harlene LABOR, MD  Active Self, Pharmacy Records  levETIRAcetam  (KEPPRA ) 500 MG tablet [id number]  Take 1 tablet (500 mg total)  by mouth 2 (two) times daily. Yacopino, Jessica L, NP  Active   metoprolol  succinate (TOPROL -XL) 100 MG 24 hr tablet [id number]  Take 1 tablet (100 mg total) by mouth 2 (two) times daily. Take with or immediately following a meal. Domenica Harlene LABOR, MD  Active   ondansetron  (ZOFRAN ) 4 MG tablet [id number]  Take 1 tablet (4 mg total) by mouth every 8 (eight) hours as needed for nausea or vomiting. Davia Nydia POUR, MD  Active   OVER THE COUNTER MEDICATION [id number]  MediKisk Lung Cleansng Spray - spray into mouth as needed. Contains - eucalyptus, peppermint, , licorice root, honeysuckle and calendula. [provider]  Active   trospium (SANCTURA) 20 MG tablet [id number]  Take 1 tablet (20 mg total) by mouth 2 (two) times daily.  Patient not taking: Reported on 01/30/2024   Guadlupe Lianne DASEN, MD  Active             Recommendation:   PCP Follow-up Continue Current Plan of Care  Follow Up Plan:   Telephone follow-up in 1 week  Medford Balboa, BSN, RN   [facility]I - [facility] RN Care Manager [phone]

## 2024-01-31 NOTE — Patient Instructions (Signed)
 Visit Information  Thank you for taking time to visit with me today. Please don't hesitate to contact me if I can be of assistance to you before our next scheduled telephone appointment.  Our next appointment is by telephone on Friday November 21st at 10:00am  Following is a copy of your care plan:   Goals Addressed             This Visit's Progress    [facility]BCI Transitions of Care (TOC) Care Plan       Problems: (reviewed 01/31/24) Recent Hospitalization for treatment of COPD Medication access barrier Difficulty with access to Nicotine  patch due to insurance declining and the patient has limited funds  Goal: (reviewed 01/31/24) Over the next 30 days, the patient will not experience hospital readmission  Interventions: (reviewed 01/31/24)  COPD Interventions: Advised patient to track and manage COPD triggers Discussed the importance of adequate rest and management of fatigue with COPD Provided education about and advised patient to utilize infection prevention strategies to reduce risk of respiratory infection Provided instruction about proper use of medications used for management of COPD including inhalers Smoking Cessation - Referral to Pharmacy for assistance - 01/24/24 - The patient is wearing the 7mg  nicotine  patch Take new medication Advair disc daily as directed Take PRN Hydralizine for anxiety when SOB - The provider has changed this to twice daily and not PRN Recommendation to PCP for long term anit-anxiety med - The patient has a Psychiatry appointment on 01/27/24 01/24/24 - The patient is asking for continuation of antibiotics due to yellow sputum that is getting darker. Pulmonolgy appointment 01/29/24. CM to contact the provider  Incontinence:  Trospium Chloride 20mg  twice daily Estradiol .5mg  vaginal suppository Pelvic Floor Physical Therapy Change pad/Depends and keep peri area clean and dry to avoid UTI's  Patient Self Care Activities:  Attend all scheduled  provider appointments Call pharmacy for medication refills 3-7 days in advance of running out of medications Call provider office for new concerns or questions  Notify RN Care Manager of [facility] call rescheduling needs Participate in Transition of Care Program/Attend [facility] scheduled calls Perform all self care activities independently  Take medications as prescribed   Follow up with Psychiatry - scheduled for 01/27/24 11/14 - States her blood pressure is still elevated 159/90  Plan:  Telephone follow up appointment with care management team member scheduled for:  Friday November 21st at 10:00am        Patient verbalizes understanding of instructions and care plan provided today and agrees to view in MyChart. Active MyChart status and patient understanding of how to access instructions and care plan via MyChart confirmed with patient.     The patient has been provided with contact information for the care management team and has been advised to call with any health related questions or concerns.   Please call the care guide team at [phone] if you need to cancel or reschedule your appointment.   Please call the Suicide and Crisis Lifeline: 988 if you are experiencing a Mental Health or Behavioral Health Crisis or need someone to talk to.  Medford Balboa, BSN, RN [place]  VBCI - [employer] [facility] RN Care Manager [phone]

## 2024-02-07 ENCOUNTER — Other Ambulatory Visit: Payer: Self-pay

## 2024-02-07 NOTE — Patient Instructions (Signed)
 Visit Information  Thank you for taking time to visit with me today. Please don't hesitate to contact me if I can be of assistance to you before our next scheduled telephone appointment.  Our next appointment is by telephone on Friday November 28th at 10:00am  Following is a copy of your care plan:   Goals Addressed             This Visit's Progress    VBCI Transitions of Care (TOC) Care Plan       Problems: (reviewed 02/07/24) Recent Hospitalization for treatment of COPD Medication access barrier Difficulty with access to Nicotine  patch due to insurance declining and the patient has limited funds  Goal: (reviewed 02/07/24) Over the next 30 days, the patient will not experience hospital readmission  Interventions: (reviewed 02/07/24)  COPD Interventions: Advised patient to track and manage COPD triggers Discussed the importance of adequate rest and management of fatigue with COPD Provided education about and advised patient to utilize infection prevention strategies to reduce risk of respiratory infection Provided instruction about proper use of medications used for management of COPD including inhalers Smoking Cessation - Referral to Pharmacy for assistance - 01/24/24 - The patient is wearing the 7mg  nicotine  patch Take new medication Advair  disc daily as directed Take PRN Hydralizine for anxiety when SOB - The provider has changed this to twice daily and not PRN Recommendation to PCP for long term anit-anxiety med - The patient has a Psychiatry appointment on 01/27/24 01/24/24 - The patient is asking for continuation of antibiotics due to yellow sputum that is getting darker. Pulmonolgy appointment 01/29/24. CM to contact the provider 02/07/24 - The patient missed her Pulmonology appointment. She states she didn't know one was scheduled for her 02/07/24 - PCP appt on 11/24. Recommendations for antibiotics due to brownish sputum  Incontinence: (reviewed 02/07/24)  Trospium   Chloride 20mg  twice daily Estradiol  .5mg  vaginal suppository Pelvic Floor Physical Therapy Change pad/Depends and keep peri area clean and dry to avoid UTI's  Patient Self Care Activities:  Attend all scheduled provider appointments Call pharmacy for medication refills 3-7 days in advance of running out of medications Call provider office for new concerns or questions  Notify RN Care Manager of [facility] call rescheduling needs Participate in Transition of Care Program/Attend TOC scheduled calls Perform all self care activities independently  Take medications as prescribed   Follow up with Psychiatry - scheduled for 01/27/24 11/14 - States her blood pressure is still elevated 159/90 11/21 - the patient has not been checking her blood pressure  Plan:  Telephone follow up appointment with care management team member scheduled for:  Friday November 28th at 10:00am        Patient verbalizes understanding of instructions and care plan provided today and agrees to view in MyChart. Active MyChart status and patient understanding of how to access instructions and care plan via MyChart confirmed with patient.     The patient has been provided with contact information for the care management team and has been advised to call with any health related questions or concerns.   Please call the care guide team at [phone] if you need to cancel or reschedule your appointment.   Please call the Suicide and Crisis Lifeline: 988 call the [place]  National Suicide Prevention Lifeline: 5615230030 or TTY: (912) 412-7376 TTY [phone]) to talk to a trained counselor if you are experiencing a Mental Health or Behavioral Health Crisis or need someone to talk to.  Medford Balboa, BSN, RN [employer]  [facility]  VBCI - Population Health RN Care Manager [phone]

## 2024-02-07 NOTE — Transitions of Care (Post Inpatient/ED Visit) (Signed)
 Transition of Care Week 5  Visit Note  02/07/2024  Name: Ruth Bell MRN: [id number]          DOB: [birth year]  Situation: Patient enrolled in [facility] 30-day program. Visit completed with Naomie Cook by telephone.   Background:   Initial Transition Care Management Follow-up Telephone Call Discharge Date and Diagnosis: 01/10/24, COPD   Past Medical History:  Diagnosis Date   Anemia    Anxiety    Arm skin lesion, left 09/05/2014   Constipation 12/27/2013   Depression    Dysphagia 04/15/2016   Eczema 08/22/2015   GERD (gastroesophageal reflux disease)    Hematuria 03/04/2016   History of shingles 01/22/2016   Hyperglycemia 01/15/2015   Hyperlipidemia, mixed 08/22/2015   Hypertension    Menometrorrhagia 2006   Migraine, unspecified, without mention of intractable migraine without mention of status migrainosus    Preventative health care 11/04/2016   Pseudoseizures    Seizures (HCC)    history of pseudoseizure disorder, last last seizure 3 wks, keppra  inc in dosage   Tubular adenoma of colon 05/2011   Uterine leiomyoma 2006    Assessment: Patient Reported Symptoms: Cognitive Cognitive Status: Alert and oriented to person, place, and time, Normal speech and language skills      Neurological Neurological Review of Symptoms: No symptoms reported    HEENT HEENT Symptoms Reported: Sore throat, Other: HEENT Comment: Hoarse voice. Improved since last week.    Cardiovascular Cardiovascular Symptoms Reported: No symptoms reported Does patient have uncontrolled Hypertension?: Yes Is patient checking Blood Pressure at home?: No Patient's Recent BP reading at home: She states she can't find her blood pressure cuff Cardiovascular Management Strategies: Medication therapy, Routine screening, Activity  Respiratory Respiratory Symptoms Reported: Productive cough, Shortness of breath, Chest tightness Additional Respiratory Details: The patient continues to state she has a productive  cough with brownish colored Sputum but she has no body aches, chills. She is almost out of her cough medicine. Encourage fluid, inhalers, and activity. She has an appointment with her PCP on 02/10/24. She missed her appointment to the Pulmonologist Respiratory Management Strategies: Medication therapy, Adequate rest, Coping strategies  Endocrine Endocrine Symptoms Reported: No symptoms reported Is patient diabetic?: No    Gastrointestinal Gastrointestinal Symptoms Reported: No symptoms reported      Genitourinary Genitourinary Symptoms Reported: Incontinence Genitourinary Management Strategies: Incontinence garment/pad, Bladder program Bladder Program:  (Pelvic Floor PT) Genitourinary Comment: The patient has been out of the Tropsium medication for overactive bladder but picked up some today. She has a follow up in two weeks regarding her program  Integumentary Integumentary Symptoms Reported: No symptoms reported    Musculoskeletal Musculoskelatal Symptoms Reviewed: Weakness, Other Other Musculoskeletal Symptoms: SOB with exertion Musculoskeletal Management Strategies: Routine screening, Activity      Psychosocial Psychosocial Symptoms Reported: No symptoms reported         There were no vitals filed for this visit.    Medications Reviewed Today     Reviewed by Moises Reusing, RN (Case Manager) on 02/07/24 at 1015  Med List Status: <None>   Medication Order Taking? Sig Documenting Provider Last Dose Status Informant  acetaminophen  (TYLENOL ) 500 MG tablet [id number]  Take 1,000 mg by mouth every 6 (six) hours as needed for mild pain (pain score 1-3). [provider]  Active Self, Pharmacy Records  albuterol  (VENTOLIN  HFA) [phone] Base) MCG/ACT inhaler [id number]  Inhale 1-2 puffs into the lungs every 4 (four) hours as needed for wheezing or shortness of breath. Rai,  Ripudeep K, MD  Active   amLODipine  (NORVASC ) 5 MG tablet [id number]  Take 1 tablet (5 mg total) by mouth  daily. Domenica Harlene LABOR, MD  Active   ascorbic acid (VITAMIN C) 100 MG tablet [id number]  Take 1 tablet by mouth daily.  Patient taking differently: Take 1,000 mg by mouth every other day.   [provider]  Active Self, Pharmacy Records  desloratadine  (CLARINEX ) 5 MG tablet [id number]  Take 1 tablet (5 mg total) by mouth daily. Domenica Harlene LABOR, MD  Active   estradiol  (ESTRACE ) 0.01 % CREA vaginal cream [id number]  Place 0.5g nightly for two weeks then twice a week after Guadlupe Lianne DASEN, MD  Active   famotidine  (PEPCID ) 20 MG tablet [id number]  TAKE 1 TABLET BY MOUTH TWICE A DAY Soldatova, Li[place], MD  Active Self, Pharmacy Records  fluticasone  (FLONASE ) 50 MCG/ACT nasal spray [id number]  Place 2 sprays into both nostrils daily. Rai, Nydia POUR, MD  Active   fluticasone -salmeterol (ADVAIR  DISKUS) 250-50 MCG/ACT AEPB [id number]  Inhale 1 puff into the lungs 2 (two) times daily. Domenica Harlene LABOR, MD  Active   guaiFENesin  (MUCINEX ) 600 MG 12 hr tablet [id number]  Take 1 tablet (600 mg total) by mouth 2 (two) times daily.  Patient not taking: Reported on 01/30/2024   Rai, Nydia POUR, MD  Active   guaiFENesin -codeine  100-10 MG/5ML syrup [id number]  Take 5 mLs by mouth every 6 (six) hours as needed for cough. Rai, Nydia POUR, MD  Active   hydrOXYzine  (ATARAX ) 10 MG tablet [id number]  Take 1 tablet (10 mg total) by mouth every 8 (eight) hours as needed for anxiety.  Patient not taking: Reported on 01/30/2024   Rai, Nydia POUR, MD  Active   hyoscyamine  (ANASPAZ ) 0.125 MG TBDP disintergrating tablet [id number]  Place 1 tablet (0.125 mg total) under the tongue every 4 (four) hours as needed.  Patient not taking: Reported on 01/30/2024   Domenica Harlene LABOR, MD  Active Self, Pharmacy Records  levETIRAcetam  (KEPPRA ) 500 MG tablet [id number]  Take 1 tablet (500 mg total) by mouth 2 (two) times daily. Yacopino, Jessica L, NP  Active   metoprolol  succinate (TOPROL -XL) 100 MG 24 hr tablet [id number]  Take 1 tablet (100 mg  total) by mouth 2 (two) times daily. Take with or immediately following a meal. Domenica Harlene LABOR, MD  Active   ondansetron  (ZOFRAN ) 4 MG tablet [id number]  Take 1 tablet (4 mg total) by mouth every 8 (eight) hours as needed for nausea or vomiting. Davia Nydia POUR, MD  Active   OVER THE COUNTER MEDICATION [id number]  MediKisk Lung Cleansng Spray - spray into mouth as needed. Contains - eucalyptus, peppermint, , licorice root, honeysuckle and calendula. [provider]  Active   trospium  (SANCTURA ) 20 MG tablet [id number] Yes Take 1 tablet (20 mg total) by mouth 2 (two) times daily. Guadlupe Lianne DASEN, MD  Active             Recommendation:   PCP Follow-up Continue Current Plan of Care  Follow Up Plan:   Telephone follow-up in 1 week Then transfer to CCM  [facility], BSN, RN [place]  [facility] - [facility] RN Care Manager [phone]

## 2024-02-09 NOTE — Assessment & Plan Note (Signed)
 Encourage heart healthy diet such as MIND or DASH diet, increase exercise, avoid trans fats, simple carbohydrates and processed foods, consider a krill or fish or flaxseed oil cap daily.

## 2024-02-09 NOTE — Assessment & Plan Note (Signed)
 hgba1c acceptable, minimize simple carbs. Increase exercise as tolerated. hgba1c acceptable, minimize simple carbs. Increase exercise as tolerated.

## 2024-02-09 NOTE — Assessment & Plan Note (Signed)
No recent activity. 

## 2024-02-09 NOTE — Assessment & Plan Note (Signed)
 Well controlled, no changes to meds. Encouraged heart healthy diet such as the DASH diet and exercise as tolerated.

## 2024-02-10 ENCOUNTER — Ambulatory Visit: Payer: Self-pay | Admitting: Family Medicine

## 2024-02-10 ENCOUNTER — Ambulatory Visit (HOSPITAL_BASED_OUTPATIENT_CLINIC_OR_DEPARTMENT_OTHER)
Admission: RE | Admit: 2024-02-10 | Discharge: 2024-02-10 | Disposition: A | Discharge status: Home / Self Care | Source: Ambulatory Visit | Attending: Family Medicine | Admitting: Family Medicine

## 2024-02-10 ENCOUNTER — Encounter: Payer: Self-pay | Admitting: Family Medicine

## 2024-02-10 ENCOUNTER — Ambulatory Visit: Admitting: Family Medicine

## 2024-02-10 VITALS — BP 132/80 | HR 63 | Temp 98.3°F | Resp 16 | Ht 62.8 in | Wt 105.2 lb

## 2024-02-10 DIAGNOSIS — G40909 Epilepsy, unspecified, not intractable, without status epilepticus: Secondary | ICD-10-CM

## 2024-02-10 DIAGNOSIS — Z111 Encounter for screening for respiratory tuberculosis: Secondary | ICD-10-CM

## 2024-02-10 DIAGNOSIS — R0789 Other chest pain: Secondary | ICD-10-CM | POA: Diagnosis present

## 2024-02-10 DIAGNOSIS — R0602 Shortness of breath: Secondary | ICD-10-CM | POA: Insufficient documentation

## 2024-02-10 DIAGNOSIS — E782 Mixed hyperlipidemia: Secondary | ICD-10-CM

## 2024-02-10 DIAGNOSIS — R739 Hyperglycemia, unspecified: Secondary | ICD-10-CM

## 2024-02-10 DIAGNOSIS — R052 Subacute cough: Secondary | ICD-10-CM

## 2024-02-10 DIAGNOSIS — I10 Essential (primary) hypertension: Secondary | ICD-10-CM

## 2024-02-10 LAB — COMPREHENSIVE METABOLIC PANEL WITH GFR
ALT: 19 U/L (ref 0–35)
AST: 23 U/L (ref 0–37)
Albumin: 4.4 g/dL (ref 3.5–5.2)
Alkaline Phosphatase: 47 U/L (ref 39–117)
BUN: 9 mg/dL (ref 6–23)
CO2: 30 meq/L (ref 19–32)
Calcium: 9.2 mg/dL (ref 8.4–10.5)
Chloride: 104 meq/L (ref 96–112)
Creatinine, Ser: 0.44 mg/dL (ref 0.40–1.20)
GFR: 101.53 mL/min (ref >60.00)
Glucose, Bld: 71 mg/dL (ref 70–99)
Potassium: 4.6 meq/L (ref 3.5–5.1)
Sodium: 140 meq/L (ref 135–145)
Total Bilirubin: 0.5 mg/dL (ref 0.2–1.2)
Total Protein: 7 g/dL (ref 6.0–8.3)

## 2024-02-10 LAB — CBC WITH DIFFERENTIAL/PLATELET
Basophils Absolute: 0.1 K/uL (ref 0.0–0.1)
Basophils Relative: 0.8 % (ref 0.0–3.0)
Eosinophils Absolute: 0.1 K/uL (ref 0.0–0.7)
Eosinophils Relative: 1.9 % (ref 0.0–5.0)
HCT: 39.6 % (ref 36.0–46.0)
Hemoglobin: 12.9 g/dL (ref 12.0–15.0)
Lymphocytes Relative: 32.5 % (ref 12.0–46.0)
Lymphs Abs: 2.5 K/uL (ref 0.7–4.0)
MCHC: 32.5 g/dL (ref 30.0–36.0)
MCV: 85.2 fl (ref 78.0–100.0)
Monocytes Absolute: 0.4 K/uL (ref 0.1–1.0)
Monocytes Relative: 5.1 % (ref 3.0–12.0)
Neutro Abs: 4.5 K/uL (ref 1.4–7.7)
Neutrophils Relative %: 59.7 % (ref 43.0–77.0)
Platelets: 259 K/uL (ref 150.0–400.0)
RBC: 4.65 Mil/uL (ref 3.87–5.11)
RDW: 14.5 % (ref 11.5–15.5)
WBC: 7.6 K/uL (ref 4.0–10.5)

## 2024-02-10 LAB — LIPID PANEL
Cholesterol: 216 mg/dL — ABNORMAL HIGH (ref 0–200)
HDL: 75.1 mg/dL (ref >39.00)
LDL Cholesterol: 120 mg/dL — ABNORMAL HIGH (ref 0–99)
NonHDL: 141.06
Total CHOL/HDL Ratio: 3
Triglycerides: 107 mg/dL (ref 0.0–149.0)
VLDL: 21.4 mg/dL (ref 0.0–40.0)

## 2024-02-10 LAB — TSH: TSH: 0.91 u[IU]/mL (ref 0.35–5.50)

## 2024-02-10 MED ORDER — HYDROCODONE BIT-HOMATROP MBR 5-1.5 MG/5ML PO SOLN: 5.0000 mL | Freq: Four times a day (QID) | ORAL | 0 refills | Status: DC | PRN

## 2024-02-10 MED ORDER — METHYLPREDNISOLONE 4 MG PO TABS: ORAL_TABLET | ORAL | 0 refills | Status: DC

## 2024-02-10 MED ORDER — AZITHROMYCIN 250 MG PO TABS: ORAL_TABLET | ORAL | 0 refills | Status: AC

## 2024-02-10 NOTE — Patient Instructions (Addendum)
 Pulse oximeter and [employer] blood pressure cuff, ask insurance if they will cover and how you get them  Cough, Adult Coughing is a reflex that clears your throat and airways (respiratory system). It helps heal and protect your lungs. It is normal to cough from time to time. A cough that happens with other symptoms or that lasts a long time may be a sign of a condition that needs treatment. A short-term (acute) cough may only last 2-3 weeks. A long-term (chronic) cough may last 8 or more weeks. Coughing is often caused by: Diseases, such as: An infection of the respiratory system. Asthma or other heart or lung diseases. Gastroesophageal reflux. This is when acid comes back up from the stomach. Breathing in things that irritate your lungs. Allergies. Postnasal drip. This is when mucus runs down the back of your throat. Smoking. Some medicines. Follow these instructions at home: Medicines Take over-the-counter and prescription medicines only as told by your health care provider. Talk with your provider before you take cough medicine (cough suppressants). Eating and drinking Do not drink alcohol. Avoid caffeine . Drink enough fluid to keep your pee (urine) pale yellow. Lifestyle Avoid cigarette smoke. Do not use any products that contain nicotine  or tobacco. These products include cigarettes, chewing tobacco, and vaping devices, such as e-cigarettes. If you need help quitting, ask your provider. Avoid things that make you cough. These may include perfumes, candles, cleaning products, or campfire smoke. General instructions  Watch for any changes to your cough. Tell your provider about them. Always cover your mouth when you cough. If the air is dry in your bedroom or home, use a cool mist vaporizer or humidifier. If your cough is worse at night, try to sleep in a semi-upright position. Rest as needed. Contact a health care provider if: You have new symptoms, or your symptoms get worse. You  cough up pus. You have a fever that does not go away or a cough that does not get better after 2-3 weeks. You cannot control your cough with medicine, and you are losing sleep. You have pain that gets worse or is not helped with medicine. You lose weight for no clear reason. You have night sweats. Get help right away if: You cough up blood. You have trouble breathing. Your heart is beating very fast. These symptoms may be an emergency. Get help right away. Call 911. Do not wait to see if the symptoms will go away. Do not drive yourself to the hospital. This information is not intended to replace advice given to you by your health care provider. Make sure you discuss any questions you have with your health care provider. Document Revised: 11/03/2021 Document Reviewed: 11/03/2021 Elsevier Patient Education  2024 [employer]k insurance will they cover and how you get one

## 2024-02-10 NOTE — Progress Notes (Signed)
 Subjective:    Patient ID: Ruth Bell, female    DOB: February 18, 1961, 63 y.o.   MRN: [id number]  Chief Complaint  Patient presents with  . Medical Management of Chronic Issues    Patient presents today for a 4 month follow-up.  . Quality Metric Gaps    DEA scan, lung cancer screening, AWV, mammogram, zoster vaccines, pneumococcal vaccine    HPI Discussed the use of AI scribe software for clinical note transcription with the patient, who gave verbal consent to proceed.  History of Present Illness Ruth Bell is a 63 year old female who presents with persistent respiratory symptoms and chest tightness following a recent hospitalization.  Since her discharge from the hospital on January 10, 2024, she has experienced persistent symptoms including shortness of breath, coughing, fatigue, body aches, and fluctuating fevers. These symptoms have not improved since her hospital stay.  She experiences shortness of breath even at rest, requiring her to sit up in bed to breathe comfortably. She has chest tightness and pain, which worsens with exertion and sometimes radiates down her arm. She uses inhalers, including albuterol , which provide minimal relief, and a throat spray containing eucalyptus and peppermint for symptomatic relief.  She has a sore throat from constant coughing and occasional nausea, for which she takes ondansetron  with relief. Her appetite is poor, and she relies on Pedialyte and light foods like potatoes and broth.  She has a history of seizures and experienced one episode during her recent illness, which led to her hospitalization. She mentions frequent urination but denies burning sensations. She is awaiting a urogynecologist appointment for further evaluation.  She is currently taking a steroid pack and has been prescribed an antibiotic, azithromycin , during her hospital stay. She also uses a cough syrup containing hydrocodone  for symptomatic relief.  She is concerned about  her heart, noting that her heart 'started messing' with her during her hospital stay. Her heart was monitored during her hospitalization, but no specific workup details are provided.    Past Medical History:  Diagnosis Date  . Anemia   . Anxiety   . Arm skin lesion, left 09/05/2014  . Constipation 12/27/2013  . Depression   . Dysphagia 04/15/2016  . Eczema 08/22/2015  . GERD (gastroesophageal reflux disease)   . Hematuria 03/04/2016  . History of shingles 01/22/2016  . Hyperglycemia 01/15/2015  . Hyperlipidemia, mixed 08/22/2015  . Hypertension   . Menometrorrhagia 2006  . Migraine, unspecified, without mention of intractable migraine without mention of status migrainosus   . Preventative health care 11/04/2016  . Pseudoseizures   . Seizures (HCC)    history of pseudoseizure disorder, last last seizure 3 wks, keppra  inc in dosage  . Tubular adenoma of colon 05/2011  . Uterine leiomyoma 2006    Past Surgical History:  Procedure Laterality Date  . APPENDECTOMY    . BILATERAL SALPINGOOPHORECTOMY  04/06/2004  . CARDIAC EVENT MONITOR  02/2016   Mostly sinus bradycardia and sinus rhythm.  Rare PVCs and PACs.  No arrhythmias.  Symptoms of chest pain and fluttering as well as passed out spell noted with normal sinus rhythm.  . INCONTINENCE SURGERY    . MASS EXCISION Left 06/02/2015   Procedure: EXCISION LEFT ARM MASS;  Surgeon: Deward Null III, MD;  Location: [facility];  Service: General;  Laterality: Left;  . NM MYOVIEW  LTD  04/2016   Reached heart rate of 127 bpm with Lexiscan .  EF 60 to 65%.  LOW RISK.  No ischemia or infarction.  SABRA ROTATOR CUFF REPAIR    . TOTAL LAPAROSCOPIC HYSTERECTOMY WITH BILATERAL SALPINGO OOPHORECTOMY  04/06/2004  . TRANSTHORACIC ECHOCARDIOGRAM  01/2016   F 55-6%.  No R WMA.  GR 1 DD.  Normal valves.    Family History  Problem Relation Age of Onset  . Heart disease Mother   . Heart attack Mother   . Cancer Father        colon  .  Hypertension Father   . Brain cancer Sister   . Hypertension Sister   . Cancer Sister        unsure of type  . Diabetes Sister        type 2  . Heart attack Sister   . Cancer Brother        unknown type  . Hypertension Brother   . Colon cancer Neg Hx   . Esophageal cancer Neg Hx   . Pancreatic cancer Neg Hx   . Stomach cancer Neg Hx   . Colon polyps Neg Hx   . Bladder Cancer Neg Hx   . Renal cancer Neg Hx     Social History   Socioeconomic History  . Marital status: Married    Spouse name: Elsie  . Number of children: 2  . Years of education: Not on file  . Highest education level: Not on file  Occupational History  . Occupation: CMA-retired  Tobacco Use  . Smoking status: Former    Current packs/day: 0.50    Average packs/day: 0.5 packs/day for 40.3 years (20.2 ttl pk-yrs)    Types: Cigarettes    Start date: 12/18/2015  . Smokeless tobacco: Never  . Tobacco comments:    3-4 cigarettes daily  Vaping Use  . Vaping status: Never Used  Substance and Sexual Activity  . Alcohol use: Yes    Comment: wine occassioanlly   . Drug use: No  . Sexual activity: Not on file    Comment: lives with husband, no dietary restrictions.   Other Topics Concern  . Not on file  Social History Narrative   Lives with her husband and their son.   Daughter lives in [place], [place].   Right handed   Social Drivers of Health   Financial Resource Strain: Not on file  Food Insecurity: No Food Insecurity (01/13/2024)   Hunger Vital Sign   . Worried About Programme Researcher, Broadcasting/film/video in the Last Year: Never true   . Ran Out of Food in the Last Year: Never true  Transportation Needs: No Transportation Needs (01/13/2024)   PRAPARE - Transportation   . Lack of Transportation (Medical): No   . Lack of Transportation (Non-Medical): No  Physical Activity: Not on file  Stress: Not on file  Social Connections: Moderately Integrated (01/07/2024)   Social Connection and Isolation Panel   . Frequency of  Communication with Friends and Family: More than three times a week   . Frequency of Social Gatherings with Friends and Family: More than three times a week   . Attends Religious Services: More than 4 times per year   . Active Member of Clubs or Organizations: No   . Attends Banker Meetings: Never   . Marital Status: Married  Catering Manager Violence: Not At Risk (01/13/2024)   Humiliation, Afraid, Rape, and Kick questionnaire   . Fear of Current or Ex-Partner: No   . Emotionally Abused: No   . Physically Abused: No   . Sexually Abused: No  Outpatient Medications Prior to Visit  Medication Sig Dispense Refill  . acetaminophen  (TYLENOL ) 500 MG tablet Take 1,000 mg by mouth every 6 (six) hours as needed for mild pain (pain score 1-3).    . albuterol  (VENTOLIN  HFA) 108 (90 Base) MCG/ACT inhaler Inhale 1-2 puffs into the lungs every 4 (four) hours as needed for wheezing or shortness of breath. 6.7 g 1  . amLODipine  (NORVASC ) 5 MG tablet Take 1 tablet (5 mg total) by mouth daily. 90 tablet 0  . ascorbic acid (VITAMIN C) 100 MG tablet Take 1 tablet by mouth daily. (Patient taking differently: Take 1,000 mg by mouth every other day.)    . desloratadine  (CLARINEX ) 5 MG tablet Take 1 tablet (5 mg total) by mouth daily. 90 tablet 0  . estradiol  (ESTRACE ) 0.01 % CREA vaginal cream Place 0.5g nightly for two weeks then twice a week after 42.5 g 3  . famotidine  (PEPCID ) 20 MG tablet TAKE 1 TABLET BY MOUTH TWICE A DAY 180 tablet 1  . fluticasone  (FLONASE ) 50 MCG/ACT nasal spray Place 2 sprays into both nostrils daily. 16 g 6  . fluticasone -salmeterol (ADVAIR  DISKUS) 250-50 MCG/ACT AEPB Inhale 1 puff into the lungs 2 (two) times daily. 60 each 2  . guaiFENesin  (MUCINEX ) 600 MG 12 hr tablet Take 1 tablet (600 mg total) by mouth 2 (two) times daily. 60 tablet 0  . guaiFENesin -codeine  100-10 MG/5ML syrup Take 5 mLs by mouth every 6 (six) hours as needed for cough. 120 mL 0  . hydrOXYzine   (ATARAX ) 10 MG tablet Take 1 tablet (10 mg total) by mouth every 8 (eight) hours as needed for anxiety. 30 tablet 0  . hyoscyamine  (ANASPAZ ) 0.125 MG TBDP disintergrating tablet Place 1 tablet (0.125 mg total) under the tongue every 4 (four) hours as needed. 30 tablet 1  . levETIRAcetam  (KEPPRA ) 500 MG tablet Take 1 tablet (500 mg total) by mouth 2 (two) times daily. 180 tablet 1  . metoprolol  succinate (TOPROL -XL) 100 MG 24 hr tablet Take 1 tablet (100 mg total) by mouth 2 (two) times daily. Take with or immediately following a meal. 180 tablet 0  . ondansetron  (ZOFRAN ) 4 MG tablet Take 1 tablet (4 mg total) by mouth every 8 (eight) hours as needed for nausea or vomiting. 30 tablet 0  . OVER THE COUNTER MEDICATION MediKisk Lung Cleansng Spray - spray into mouth as needed. Contains - eucalyptus, peppermint, , licorice root, honeysuckle and calendula.    . trospium  (SANCTURA ) 20 MG tablet Take 1 tablet (20 mg total) by mouth 2 (two) times daily. 60 tablet 2   No facility-administered medications prior to visit.    No Known Allergies  Review of Systems  Constitutional:  Positive for malaise/fatigue. Negative for fever.  HENT:  Positive for congestion and sore throat.   Eyes:  Negative for blurred vision.  Respiratory:  Positive for cough, shortness of breath and wheezing.   Cardiovascular:  Positive for chest pain and claudication. Negative for palpitations and leg swelling.  Gastrointestinal:  Positive for nausea. Negative for abdominal pain, blood in stool and vomiting.  Genitourinary:  Negative for dysuria, flank pain and frequency.  Musculoskeletal:  Positive for myalgias. Negative for falls.  Skin:  Negative for rash.  Neurological:  Negative for dizziness, loss of consciousness and headaches.  Endo/Heme/Allergies:  Negative for environmental allergies.  Psychiatric/Behavioral:  Negative for depression. The patient is not nervous/anxious.        Objective:    Physical  Exam Constitutional:  General: She is not in acute distress.    Appearance: Normal appearance. She is well-developed. She is not toxic-appearing.  HENT:     Head: Normocephalic and atraumatic.     Right Ear: External ear normal.     Left Ear: External ear normal.     Nose: Nose normal.  Eyes:     General:        Right eye: No discharge.        Left eye: No discharge.     Conjunctiva/sclera: Conjunctivae normal.  Neck:     Thyroid : No thyromegaly.  Cardiovascular:     Rate and Rhythm: Normal rate and regular rhythm.     Heart sounds: Normal heart sounds. No murmur heard. Pulmonary:     Effort: Pulmonary effort is normal. No respiratory distress.     Breath sounds: Normal breath sounds.  Abdominal:     General: Bowel sounds are normal.     Palpations: Abdomen is soft.     Tenderness: There is no abdominal tenderness. There is no guarding.  Musculoskeletal:        General: Normal range of motion.     Cervical back: Neck supple.  Lymphadenopathy:     Cervical: No cervical adenopathy.  Skin:    General: Skin is warm and dry.  Neurological:     Mental Status: She is alert and oriented to person, place, and time.  Psychiatric:        Mood and Affect: Mood normal.        Behavior: Behavior normal.        Thought Content: Thought content normal.        Judgment: Judgment normal.    Resp 16   Ht 5' 2.8 (1.595 m)   LMP  (LMP Unknown)   BMI 17.65 kg/m  Wt Readings from Last 3 Encounters:  01/31/24 99 lb (44.9 kg)  01/24/24 99 lb (44.9 kg)  01/22/24 99 lb 12.8 oz (45.3 kg)    Diabetic Foot Exam - Simple   No data filed    Lab Results  Component Value Date   WBC 10.6 (H) 01/22/2024   HGB 12.5 01/22/2024   HCT 37.4 01/22/2024   PLT 350.0 01/22/2024   GLUCOSE 96 01/22/2024   CHOL 208 (H) 10/10/2023   TRIG 66.0 10/10/2023   HDL 86.20 10/10/2023   LDLCALC 109 (H) 10/10/2023   ALT 28 01/22/2024   AST 17 01/22/2024   NA 139 01/22/2024   K 3.6 01/22/2024   CL  102 01/22/2024   CREATININE 0.50 01/22/2024   BUN 13 01/22/2024   CO2 28 01/22/2024   TSH 1.33 10/10/2023   INR 1.0 01/06/2024   HGBA1C 5.9 (H) 01/10/2024    Lab Results  Component Value Date   TSH 1.33 10/10/2023   Lab Results  Component Value Date   WBC 10.6 (H) 01/22/2024   HGB 12.5 01/22/2024   HCT 37.4 01/22/2024   MCV 84.0 01/22/2024   PLT 350.0 01/22/2024   Lab Results  Component Value Date   NA 139 01/22/2024   K 3.6 01/22/2024   CO2 28 01/22/2024   GLUCOSE 96 01/22/2024   BUN 13 01/22/2024   CREATININE 0.50 01/22/2024   BILITOT 0.6 01/22/2024   ALKPHOS 46 01/22/2024   AST 17 01/22/2024   ALT 28 01/22/2024   PROT 6.7 01/22/2024   ALBUMIN 4.3 01/22/2024   CALCIUM  9.2 01/22/2024   ANIONGAP 12 01/10/2024   EGFR 107 06/14/2021   GFR 98.48  01/22/2024   Lab Results  Component Value Date   CHOL 208 (H) 10/10/2023   Lab Results  Component Value Date   HDL 86.20 10/10/2023   Lab Results  Component Value Date   LDLCALC 109 (H) 10/10/2023   Lab Results  Component Value Date   TRIG 66.0 10/10/2023   Lab Results  Component Value Date   CHOLHDL 2 10/10/2023   Lab Results  Component Value Date   HGBA1C 5.9 (H) 01/10/2024       Assessment & Plan:  Seizure disorder (HCC) Assessment & Plan: No recent activity   Essential hypertension Assessment & Plan: Well controlled, no changes to meds. Encouraged heart healthy diet such as the DASH diet and exercise as tolerated.    Hyperlipidemia, mixed Assessment & Plan: Encourage heart healthy diet such as MIND or DASH diet, increase exercise, avoid trans fats, simple carbohydrates and processed foods, consider a krill or fish or flaxseed oil cap daily.    Hyperglycemia Assessment & Plan: hgba1c acceptable, minimize simple carbs. Increase exercise as tolerated. hgba1c acceptable, minimize simple carbs. Increase exercise as tolerated.      Assessment and Plan Assessment & Plan Subacute cough with  shortness of breath and chest pain Persistent cough with shortness of breath and chest pain for over a month. Symptoms include tightness in the chest, sore throat, and difficulty lying flat. Differential includes respiratory infection and cardiac issues. Current symptoms suggest possible infection or cardiac involvement. - Ordered chest x-ray today - Ordered transthoracic echocardiogram - Referred to cardiology for further evaluation - Referred to pulmonology for further evaluation - Prescribed azithromycin  for potential infection - Prescribed Medrol  Dose Pack for inflammation - Prescribed Hydromet for cough and sleep aid - Advised to monitor oxygen levels and contact insurance for potential home oxygen therapy  Essential hypertension Hypertension management ongoing.  Hyperglycemia Management ongoing.  Epilepsy Recent seizure episode during hospitalization. Seizure threshold may be affected by current illness.  Urinary incontinence Chronic urinary incontinence with frequent urination and urgency. Previous treatments have been ineffective. Referral to urogynecologist scheduled for January or February. - Continue estrogen cream for vaginal atrophy - Follow up with urogynecologist as scheduled  Depression and insomnia Ongoing depression and insomnia. Behavioral health referral initiated for talk therapy. - Provided referral for talk therapy - Discussed potential for psychiatric evaluation if needed  General Health Maintenance Discussion of advance directives and healthcare preferences. - Encouraged completion of advance directives and healthcare preferences  Recording duration: 22 minutes     Harlene Horton, MD

## 2024-02-12 LAB — QUANTIFERON-TB GOLD PLUS
Mitogen-NIL: >10 [IU]/mL
NIL: 0.01 [IU]/mL
QuantiFERON-TB Gold Plus: NEGATIVE
TB1-NIL: 0 [IU]/mL
TB2-NIL: 0.01 [IU]/mL

## 2024-02-12 NOTE — Telephone Encounter (Signed)
 Tried calling Pt back, no answer, unable to leave vm, vm box not set up. Okay to relay results of x-ray on call back.

## 2024-02-12 NOTE — Telephone Encounter (Signed)
 Copied from CRM [id number]. Topic: General - Other >> Feb 12, 2024 12:53 PM Rosina BIRCH wrote: Reason for CRM: patient returning a call to [facility]

## 2024-02-14 ENCOUNTER — Other Ambulatory Visit: Payer: Self-pay

## 2024-02-14 VITALS — Wt 105.0 lb

## 2024-02-14 DIAGNOSIS — J441 Chronic obstructive pulmonary disease with (acute) exacerbation: Secondary | ICD-10-CM

## 2024-02-14 NOTE — Patient Instructions (Signed)
 Visit Information  Thank you for taking time to visit with me today. Please don't hesitate to contact me if I can be of assistance to you before our next scheduled telephone appointment.    Following is a copy of your care plan:   Goals Addressed             This Visit's Progress    COMPLETED: [facility] Transitions of Care (TOC) Care Plan       Problems: (reviewed 02/14/24) Recent Hospitalization for treatment of COPD Medication access barrier Difficulty with access to Nicotine  patch due to insurance declining and the patient has limited funds  Goal: (reviewed 02/14/24) Over the next 30 days, the patient will not experience hospital readmission  Interventions: (reviewed 02/14/24)  COPD Interventions: Advised patient to track and manage COPD triggers Discussed the importance of adequate rest and management of fatigue with COPD Provided education about and advised patient to utilize infection prevention strategies to reduce risk of respiratory infection Provided instruction about proper use of medications used for management of COPD including inhalers Smoking Cessation - Referral to Pharmacy for assistance - 01/24/24 - The patient is wearing the 7mg  nicotine  patch Take new medication Advair  disc daily as directed Take PRN Hydralizine for anxiety when SOB - The provider has changed this to twice daily and not PRN Recommendation to PCP for long term anit-anxiety med - The patient has a Psychiatry appointment on 01/27/24 01/24/24 - The patient is asking for continuation of antibiotics due to yellow sputum that is getting darker. Pulmonolgy appointment 01/29/24. CM to contact the provider 02/07/24 - The patient missed her Pulmonology appointment. She states she didn't know one was scheduled for her 02/07/24 - PCP appt on 11/24. Recommendations for antibiotics due to brownish sputum  Incontinence: (reviewed 02/14/24)  Trospium  Chloride 20mg  twice daily Estradiol  .5mg  vaginal  suppository Pelvic Floor Physical Therapy Change pad/Depends and keep peri area clean and dry to avoid UTI's  Patient Self Care Activities: (reviewed 02/14/24) Attend all scheduled provider appointments Call pharmacy for medication refills 3-7 days in advance of running out of medications Call provider office for new concerns or questions  Notify RN Care Manager of [facility] call rescheduling needs Participate in Transition of Care Program/Attend [facility] scheduled calls Perform all self care activities independently  Take medications as prescribed   Follow up with Psychiatry - scheduled for 01/27/24 11/14 - States her blood pressure is still elevated 159/90 11/21 - the patient has not been checking her blood pressure  Plan:  Telephone follow up appointment with care management team member scheduled for:  The patient has completed her goals for TOC. Referred to CCM        Patient verbalizes understanding of instructions and care plan provided today and agrees to view in MyChart. Active MyChart status and patient understanding of how to access instructions and care plan via MyChart confirmed with patient.     The patient has been provided with contact information for the care management team and has been advised to call with any health related questions or concerns.   Please call the care guide team at [phone] if you need to cancel or reschedule your appointment.   Please call the Suicide and Crisis Lifeline: 988 call the [place]  National Suicide Prevention Lifeline: 514 693 2731 or TTY: (914)671-2305 TTY (402)590-8195) to talk to a trained counselor if you are experiencing a Mental Health or Behavioral Health Crisis or need someone to talk to.  Medford Balboa, BSN, RN [employer]  VBCI -  Population Health RN Care Manager [phone]

## 2024-02-14 NOTE — Transitions of Care (Post Inpatient/ED Visit) (Signed)
 Transition of Care Week 6  Visit Note  02/14/2024  Name: Ruth Bell MRN: [id number]          DOB: 17-Nov-1960  Situation: Patient enrolled in [facility] ([facility] 30-day program. Visit completed with Naomie Cook by telephone.   Background:   Initial Transition Care Management Follow-up Telephone Call Discharge Date and Diagnosis: No data recorded   Past Medical History:  Diagnosis Date   Anemia    Anxiety    Arm skin lesion, left 09/05/2014   Constipation 12/27/2013   Depression    Dysphagia 04/15/2016   Eczema 08/22/2015   GERD (gastroesophageal reflux disease)    Hematuria 03/04/2016   History of shingles 01/22/2016   Hyperglycemia 01/15/2015   Hyperlipidemia, mixed 08/22/2015   Hypertension    Menometrorrhagia 2006   Migraine, unspecified, without mention of intractable migraine without mention of status migrainosus    Preventative health care 11/04/2016   Pseudoseizures    Seizures (HCC)    history of pseudoseizure disorder, last last seizure 3 wks, keppra  inc in dosage   Tubular adenoma of colon 05/2011   Uterine leiomyoma 2006    Assessment: Patient Reported Symptoms: Cognitive Cognitive Status: Alert and oriented to person, place, and time, Normal speech and language skills      Neurological Neurological Review of Symptoms: No symptoms reported    HEENT HEENT Symptoms Reported: Sore throat, Mouth dryness HEENT Comment: The patient is on antibiotics and a Medrol  Dose Pac    Cardiovascular Cardiovascular Symptoms Reported: No symptoms reported Does patient have uncontrolled Hypertension?: Yes Is patient checking Blood Pressure at home?: No Patient's Recent BP reading at home: Last blood pressure was 132/82 Cardiovascular Management Strategies: Medication therapy Weight: 105 lb (47.6 kg) Cardiovascular Comment: The patient has been able to gain a few pounds  Respiratory Respiratory Symptoms Reported: Productive cough, Shortness of breath, Chest tightness Additional  Respiratory Details: The patient went to the PCP on 01/31/24. She was started on antibiotics and a Medrol  Dose Pac. Encouraged fluids, use inhalers and increase activity Respiratory Management Strategies: Medication therapy, Coping strategies  Endocrine Endocrine Symptoms Reported: No symptoms reported Is patient diabetic?: No    Gastrointestinal   Gastrointestinal Comment: The patient has managed to gain a few pounds    Genitourinary Genitourinary Symptoms Reported: Incontinence Genitourinary Management Strategies: Incontinence garment/pad, Activity Genitourinary Comment: She is participating in a pelvic floor therapy  Integumentary Integumentary Symptoms Reported: No symptoms reported    Musculoskeletal Musculoskelatal Symptoms Reviewed: Weakness Other Musculoskeletal Symptoms: SOB with exertion Musculoskeletal Management Strategies: Activity, Routine screening      Psychosocial Psychosocial Symptoms Reported: No symptoms reported         Today's Vitals   02/14/24 1016  Weight: 105 lb (47.6 kg)      Medications Reviewed Today     Reviewed by Moises Reusing, RN (Case Manager) on 02/14/24 at 1006  Med List Status: <None>   Medication Order Taking? Sig Documenting Provider Last Dose Status Informant  acetaminophen  (TYLENOL ) 500 MG tablet [id number]  Take 1,000 mg by mouth every 6 (six) hours as needed for mild pain (pain score 1-3). [provider]  Active Self, Pharmacy Records  albuterol  (VENTOLIN  HFA) 108 [phone] Base) MCG/ACT inhaler [id number]  Inhale 1-2 puffs into the lungs every 4 (four) hours as needed for wheezing or shortness of breath. Davia Nydia POUR, MD  Expired 02/10/24 2359   amLODipine  (NORVASC ) 5 MG tablet [id number]  Take 1 tablet (5 mg total) by mouth daily. Domenica Harlene LABOR,  MD  Active   ascorbic acid (VITAMIN C) 100 MG tablet [id number]  Take 1 tablet by mouth daily.  Patient taking differently: Take 1,000 mg by mouth every other day.   [provider]  Active Self, Pharmacy Records  azithromycin  (ZITHROMAX ) 250 MG tablet [id number]  Take 2 tablets on day 1, then 1 tablet daily on days 2 through 5 Domenica Blackbird A, MD  Active   desloratadine  (CLARINEX ) 5 MG tablet [id number]  Take 1 tablet (5 mg total) by mouth daily. Domenica Blackbird LABOR, MD  Active   estradiol  (ESTRACE ) 0.01 % CREA vaginal cream [id number]  Place 0.5g nightly for two weeks then twice a week after Guadlupe Lianne DASEN, MD  Active   famotidine  (PEPCID ) 20 MG tablet [id number]  TAKE 1 TABLET BY MOUTH TWICE A DAY Soldatova, Li[place], MD  Active Self, Pharmacy Records  fluticasone  (FLONASE ) 50 MCG/ACT nasal spray [id number]  Place 2 sprays into both nostrils daily. Rai, Nydia POUR, MD  Active   fluticasone -salmeterol (ADVAIR  DISKUS) 250-50 MCG/ACT AEPB [id number]  Inhale 1 puff into the lungs 2 (two) times daily. Domenica Blackbird LABOR, MD  Active   guaiFENesin  (MUCINEX ) 600 MG 12 hr tablet [id number]  Take 1 tablet (600 mg total) by mouth 2 (two) times daily. Rai, Nydia POUR, MD  Active   guaiFENesin -codeine  100-10 MG/5ML syrup [id number]  Take 5 mLs by mouth every 6 (six) hours as needed for cough. Rai, Nydia POUR, MD  Active   HYDROcodone  bit-homatropine (HYDROMET) 5-1.5 MG/5ML syrup [id number]  Take 5 mLs by mouth every 6 (six) hours as needed for cough. Domenica Blackbird LABOR, MD  Active   hydrOXYzine  (ATARAX ) 10 MG tablet [id number]  Take 1 tablet (10 mg total) by mouth every 8 (eight) hours as needed for anxiety. Rai, Nydia POUR, MD  Active   hyoscyamine  (ANASPAZ ) 0.125 MG TBDP disintergrating tablet [id number]  Place 1 tablet (0.125 mg total) under the tongue every 4 (four) hours as needed. Domenica Blackbird LABOR, MD  Active Self, Pharmacy Records  levETIRAcetam  (KEPPRA ) 500 MG tablet [id number]  Take 1 tablet (500 mg total) by mouth 2 (two) times daily. Yacopino, Jessica L, NP  Active   methylPREDNISolone  (MEDROL ) 4 MG tablet [id number]  5 tabs po x 3 day then 4 tabs po x 3 day then 3 tabs po x 3 day then 2 tabs po  x 3 day then 1 tab po x 3 day and stop Domenica Blackbird LABOR, MD  Active   metoprolol  succinate (TOPROL -XL) 100 MG 24 hr tablet [id number]  Take 1 tablet (100 mg total) by mouth 2 (two) times daily. Take with or immediately following a meal. Domenica Blackbird LABOR, MD  Active   ondansetron  (ZOFRAN ) 4 MG tablet [id number]  Take 1 tablet (4 mg total) by mouth every 8 (eight) hours as needed for nausea or vomiting. Davia Nydia POUR, MD  Active   OVER THE COUNTER MEDICATION [id number]  MediKisk Lung Cleansng Spray - spray into mouth as needed. Contains - eucalyptus, peppermint, , licorice root, honeysuckle and calendula. [provider]  Active   trospium  (SANCTURA ) 20 MG tablet [id number]  Take 1 tablet (20 mg total) by mouth 2 (two) times daily. Guadlupe Lianne DASEN, MD  Active             Recommendation:   Continue Current Plan of Care Referral to complex Case Management  Follow Up Plan:   Closing From:  Transitions of Care Program  [facility], BSN, RN [facility]  VBCI - Population Health RN Care Manager [phone]

## 2024-02-18 ENCOUNTER — Other Ambulatory Visit: Payer: Self-pay | Admitting: Family Medicine

## 2024-02-18 MED ORDER — CEFDINIR 300 MG PO CAPS: 300.0000 mg | ORAL_CAPSULE | Freq: Two times a day (BID) | ORAL | 0 refills | Status: AC

## 2024-02-19 NOTE — Progress Notes (Unsigned)
 Subjective:     Patient ID: Ruth Bell, female    DOB: 1960-04-04, 63 y.o.   MRN: [id number]  No chief complaint on file.   HPI  Discussed the use of AI scribe software for clinical note transcription with the patient, who gave verbal consent to proceed.  History of Present Illness              Health Maintenance Due  Topic Date Due   Medicare Annual Wellness (AWV)  Never done   Zoster Vaccines- Shingrix (1 of 2) Never done   Pneumococcal Vaccine: 50+ Years (2 of 2 - PCV) 02/22/2013   Mammogram  02/07/2022   Bone Density Scan  Never done   Lung Cancer Screening  11/12/2023    Past Medical History:  Diagnosis Date   Anemia    Anxiety    Arm skin lesion, left 09/05/2014   Constipation 12/27/2013   Depression    Dysphagia 04/15/2016   Eczema 08/22/2015   GERD (gastroesophageal reflux disease)    Hematuria 03/04/2016   History of shingles 01/22/2016   Hyperglycemia 01/15/2015   Hyperlipidemia, mixed 08/22/2015   Hypertension    Menometrorrhagia 2006   Migraine, unspecified, without mention of intractable migraine without mention of status migrainosus    Preventative health care 11/04/2016   Pseudoseizures    Seizures (HCC)    history of pseudoseizure disorder, last last seizure 3 wks, keppra  inc in dosage   Tubular adenoma of colon 05/2011   Uterine leiomyoma 2006    Past Surgical History:  Procedure Laterality Date   APPENDECTOMY     BILATERAL SALPINGOOPHORECTOMY  04/06/2004   CARDIAC EVENT MONITOR  02/2016   Mostly sinus bradycardia and sinus rhythm.  Rare PVCs and PACs.  No arrhythmias.  Symptoms of chest pain and fluttering as well as passed out spell noted with normal sinus rhythm.   INCONTINENCE SURGERY     MASS EXCISION Left 06/02/2015   Procedure: EXCISION LEFT ARM MASS;  Surgeon: Deward Null III, MD;  Location: [facility];  Service: General;  Laterality: Left;   NM MYOVIEW  LTD  04/2016   Reached heart rate of 127 bpm with  Lexiscan .  EF 60 to 65%.  LOW RISK.  No ischemia or infarction.   ROTATOR CUFF REPAIR     TOTAL LAPAROSCOPIC HYSTERECTOMY WITH BILATERAL SALPINGO OOPHORECTOMY  04/06/2004   TRANSTHORACIC ECHOCARDIOGRAM  01/2016   F 55-6%.  No R WMA.  GR 1 DD.  Normal valves.    Family History  Problem Relation Age of Onset   Heart disease Mother    Heart attack Mother    Cancer Father        colon   Hypertension Father    Brain cancer Sister    Hypertension Sister    Cancer Sister        unsure of type   Diabetes Sister        type 2   Heart attack Sister    Cancer Brother        unknown type   Hypertension Brother    Colon cancer Neg Hx    Esophageal cancer Neg Hx    Pancreatic cancer Neg Hx    Stomach cancer Neg Hx    Colon polyps Neg Hx    Bladder Cancer Neg Hx    Renal cancer Neg Hx     Social History   Socioeconomic History   Marital status: Married    Spouse name: Elsie  Number of children: 2   Years of education: Not on file   Highest education level: Not on file  Occupational History   Occupation: CMA-retired  Tobacco Use   Smoking status: Former    Current packs/day: 0.50    Average packs/day: 0.5 packs/day for 40.4 years (20.2 ttl pk-yrs)    Types: Cigarettes    Start date: 12/18/2015   Smokeless tobacco: Never   Tobacco comments:    3-4 cigarettes daily  Vaping Use   Vaping status: Never Used  Substance and Sexual Activity   Alcohol use: Yes    Comment: wine occassioanlly    Drug use: No   Sexual activity: Not on file    Comment: lives with husband, no dietary restrictions.   Other Topics Concern   Not on file  Social History Narrative   Lives with her husband and their son.   Daughter lives in [place], [place].   Right handed   Social Drivers of Health   Financial Resource Strain: Not on file  Food Insecurity: No Food Insecurity (01/13/2024)   Hunger Vital Sign    Worried About Running Out of Food in the Last Year: Never true    Ran Out of Food in the  Last Year: Never true  Transportation Needs: No Transportation Needs (01/13/2024)   PRAPARE - Administrator, Civil Service (Medical): No    Lack of Transportation (Non-Medical): No  Physical Activity: Not on file  Stress: Not on file  Social Connections: Moderately Integrated (01/07/2024)   Social Connection and Isolation Panel    Frequency of Communication with Friends and Family: More than three times a week    Frequency of Social Gatherings with Friends and Family: More than three times a week    Attends Religious Services: More than 4 times per year    Active Member of [employer] or Organizations: No    Attends Banker Meetings: Never    Marital Status: Married  Catering Manager Violence: Not At Risk (01/13/2024)   Humiliation, Afraid, Rape, and Kick questionnaire    Fear of Current or Ex-Partner: No    Emotionally Abused: No    Physically Abused: No    Sexually Abused: No    Outpatient Medications Prior to Visit  Medication Sig Dispense Refill   acetaminophen  (TYLENOL ) 500 MG tablet Take 1,000 mg by mouth every 6 (six) hours as needed for mild pain (pain score 1-3).     albuterol  (VENTOLIN  HFA) 108 (90 Base) MCG/ACT inhaler Inhale 1-2 puffs into the lungs every 4 (four) hours as needed for wheezing or shortness of breath. 6.7 g 1   amLODipine  (NORVASC ) 5 MG tablet Take 1 tablet (5 mg total) by mouth daily. 90 tablet 0   ascorbic acid (VITAMIN C) 100 MG tablet Take 1 tablet by mouth daily. (Patient taking differently: Take 1,000 mg by mouth every other day.)     cefdinir  (OMNICEF ) 300 MG capsule Take 1 capsule (300 mg total) by mouth 2 (two) times daily for 7 days. 14 capsule 0   desloratadine  (CLARINEX ) 5 MG tablet Take 1 tablet (5 mg total) by mouth daily. 90 tablet 0   estradiol  (ESTRACE ) 0.01 % CREA vaginal cream Place 0.5g nightly for two weeks then twice a week after 42.5 g 3   famotidine  (PEPCID ) 20 MG tablet TAKE 1 TABLET BY MOUTH TWICE A DAY 180 tablet  1   fluticasone  (FLONASE ) 50 MCG/ACT nasal spray Place 2 sprays into both nostrils daily.  16 g 6   fluticasone -salmeterol (ADVAIR  DISKUS) 250-50 MCG/ACT AEPB Inhale 1 puff into the lungs 2 (two) times daily. 60 each 2   guaiFENesin  (MUCINEX ) 600 MG 12 hr tablet Take 1 tablet (600 mg total) by mouth 2 (two) times daily. 60 tablet 0   guaiFENesin -codeine  100-10 MG/5ML syrup Take 5 mLs by mouth every 6 (six) hours as needed for cough. 120 mL 0   HYDROcodone  bit-homatropine (HYDROMET) 5-1.5 MG/5ML syrup Take 5 mLs by mouth every 6 (six) hours as needed for cough. 180 mL 0   hydrOXYzine  (ATARAX ) 10 MG tablet Take 1 tablet (10 mg total) by mouth every 8 (eight) hours as needed for anxiety. 30 tablet 0   hyoscyamine  (ANASPAZ ) 0.125 MG TBDP disintergrating tablet Place 1 tablet (0.125 mg total) under the tongue every 4 (four) hours as needed. 30 tablet 1   levETIRAcetam  (KEPPRA ) 500 MG tablet Take 1 tablet (500 mg total) by mouth 2 (two) times daily. 180 tablet 1   methylPREDNISolone  (MEDROL ) 4 MG tablet 5 tabs po x 3 day then 4 tabs po x 3 day then 3 tabs po x 3 day then 2 tabs po x 3 day then 1 tab po x 3 day and stop 45 tablet 0   metoprolol  succinate (TOPROL -XL) 100 MG 24 hr tablet Take 1 tablet (100 mg total) by mouth 2 (two) times daily. Take with or immediately following a meal. 180 tablet 0   ondansetron  (ZOFRAN ) 4 MG tablet Take 1 tablet (4 mg total) by mouth every 8 (eight) hours as needed for nausea or vomiting. 30 tablet 0   OVER THE COUNTER MEDICATION MediKisk Lung Cleansng Spray - spray into mouth as needed. Contains - eucalyptus, peppermint, , licorice root, honeysuckle and calendula.     trospium  (SANCTURA ) 20 MG tablet Take 1 tablet (20 mg total) by mouth 2 (two) times daily. 60 tablet 2   No facility-administered medications prior to visit.    No Known Allergies  ROS     Objective:    Physical Exam   LMP  (LMP Unknown)  Wt Readings from Last 3 Encounters:  02/14/24 105 lb  (47.6 kg)  02/10/24 105 lb 3.2 oz (47.7 kg)  01/31/24 99 lb (44.9 kg)       Assessment & Plan:   Problem List Items Addressed This Visit   None   I am having Ruth Bell maintain her ascorbic acid, famotidine , hyoscyamine , acetaminophen , albuterol , ondansetron , fluticasone , guaiFENesin , guaiFENesin -codeine , hydrOXYzine , levETIRAcetam , estradiol , trospium , OVER THE COUNTER MEDICATION, amLODipine , desloratadine , fluticasone -salmeterol, metoprolol  succinate, methylPREDNISolone , HYDROcodone  bit-homatropine, and cefdinir .  No orders of the defined types were placed in this encounter.

## 2024-02-20 ENCOUNTER — Ambulatory Visit: Admitting: Student

## 2024-02-20 ENCOUNTER — Encounter: Payer: Self-pay | Admitting: Student

## 2024-02-20 VITALS — BP 113/70 | HR 75 | Ht 62.8 in | Wt 107.6 lb

## 2024-02-20 DIAGNOSIS — F39 Unspecified mood [affective] disorder: Secondary | ICD-10-CM | POA: Insufficient documentation

## 2024-02-20 DIAGNOSIS — K921 Melena: Secondary | ICD-10-CM

## 2024-02-20 DIAGNOSIS — R079 Chest pain, unspecified: Secondary | ICD-10-CM | POA: Insufficient documentation

## 2024-02-20 DIAGNOSIS — Z23 Encounter for immunization: Secondary | ICD-10-CM

## 2024-02-20 DIAGNOSIS — F32A Depression, unspecified: Secondary | ICD-10-CM | POA: Diagnosis not present

## 2024-02-20 DIAGNOSIS — M940 Chondrocostal junction syndrome [Tietze]: Secondary | ICD-10-CM

## 2024-02-20 DIAGNOSIS — I10 Essential (primary) hypertension: Secondary | ICD-10-CM

## 2024-02-20 DIAGNOSIS — F17200 Nicotine dependence, unspecified, uncomplicated: Secondary | ICD-10-CM

## 2024-02-20 DIAGNOSIS — J449 Chronic obstructive pulmonary disease, unspecified: Secondary | ICD-10-CM | POA: Insufficient documentation

## 2024-02-20 DIAGNOSIS — G40909 Epilepsy, unspecified, not intractable, without status epilepticus: Secondary | ICD-10-CM

## 2024-02-20 MED ORDER — MELOXICAM 7.5 MG PO TABS: 7.5000 mg | ORAL_TABLET | Freq: Every day | ORAL | 0 refills | Status: DC

## 2024-02-20 MED ORDER — KETOROLAC TROMETHAMINE 60 MG/2ML IM SOLN: 60.0000 mg | Freq: Once | INTRAMUSCULAR | Status: AC

## 2024-02-20 NOTE — Progress Notes (Signed)
 No chief complaint on file.   Ruth Bell is a 63 y.o. female here for evaluation of chest pain.  History of Present Illness Ruth Bell is a 63 year old female who presents with chest pain.  She describes sharp, stabbing pain on the left side of her chest for the past three to four days. The pain worsens with movement, including raising her arms, running, coughing, breathing, and with palpation. She has not taken anything OTC, has used heating pad and elevation with minimal relief. Denies trauma.  Patient reports a single episode of bloody stool on Monday with no prior history of similar symptoms and no recurrence since. Denies abdominal pain, dizziness, weakness, nausea, vomiting, diarrhea, constipation, or recent changes in bowel habits.  Duration of issue: 4 days Quality: sharp Severity: 8/10 Radiation: no Associated symptoms: worse with movement Cardiac history: no Smoker? Yes  Patient denies fever, chills,  palpitations, edema, HA, vision changes, N/V/D, abdominal pain, urinary symptoms, rash, weight changes.  Past Medical History:  Diagnosis Date   Anemia    Anxiety    Arm skin lesion, left 09/05/2014   Constipation 12/27/2013   Depression    Dysphagia 04/15/2016   Eczema 08/22/2015   GERD (gastroesophageal reflux disease)    Hematuria 03/04/2016   History of shingles 01/22/2016   Hyperglycemia 01/15/2015   Hyperlipidemia, mixed 08/22/2015   Hypertension    Menometrorrhagia 2006   Migraine, unspecified, without mention of intractable migraine without mention of status migrainosus    Preventative health care 11/04/2016   Pseudoseizures    Seizures (HCC)    history of pseudoseizure disorder, last last seizure 3 wks, keppra  inc in dosage   Tubular adenoma of colon 05/2011   Uterine leiomyoma 2006   Family History  Problem Relation Age of Onset   Heart disease Mother    Heart attack Mother    Cancer Father        colon   Hypertension Father    Brain  cancer Sister    Hypertension Sister    Cancer Sister        unsure of type   Diabetes Sister        type 2   Heart attack Sister    Cancer Brother        unknown type   Hypertension Brother    Colon cancer Neg Hx    Esophageal cancer Neg Hx    Pancreatic cancer Neg Hx    Stomach cancer Neg Hx    Colon polyps Neg Hx    Bladder Cancer Neg Hx    Renal cancer Neg Hx     BP 113/70   Pulse 75   Ht 5' 2.8 (1.595 m)   Wt 107 lb 9.6 oz (48.8 kg)   LMP  (LMP Unknown)   SpO2 98%   BMI 19.18 kg/m  Gen: awake, alert, appears stated age HEENT: PERRLA, MMM Neck: No masses or asymmetry Heart: RRR, no bruits, no LE edema Lungs: CTAB, no accessory muscle use Abd: Soft, NT, ND, no masses or organomegaly MSK: chest pain is reproducible to palpitation Psych: flight of ideas, rapid speech, cooperative  EKG interpretation: Rate: 68 Rhythm: NSR No ST/T changes concerning for acute ischemia/infarct  EKG performed to evaluate chest pain . I have personally seen and reviewed the EKG. No acute concerns. Harlene LITTIE Jolly, NP  02/20/24       Chest pain, unspecified type - Plan: EKG 12-Lead, meloxicam  (MOBIC ) 7.5 MG tablet  Depression,  unspecified depression type  Essential hypertension - Plan: CBC, Comp Met (CMET)  Tobacco dependence  Seizure disorder (HCC)  Chronic obstructive pulmonary disease, unspecified COPD type (HCC)  Costochondritis - Plan: ketorolac  (TORADOL ) injection 60 mg  Blood in stool - Plan: Fecal occult blood, imunochemical  Mood disorder - Plan: Ambulatory referral to Psychiatry  Need for pneumococcal 20-valent conjugate vaccination - Plan: Pneumococcal conjugate vaccine 20-valent (Prevnar 20)   Chest Pain  EKG normal. She has been previously referred to Cardiology, encourage follow up if symptoms persist. Recent ECHO pending results.   Likely costochondritis.  - Administered Toradol  for pain relief. - RX meloxicam . Medication and common side effects  reviewed with the patient; patient voiced understanding and had no further questions at this time.  - Advised against ibuprofen  with meloxicam . - Recommended heat application and rest. - Encouraged gradual activity increase.  Blood in stool Single episode of blood in stool. No prior history. - CBC, CMP, FOBT - Consider gastroenterology referral if persistant  Chronic obstructive pulmonary disease Shortness of breath and recent exacerbation. Pulmonology follow-up pending. - Encouraged pulmonology follow-up.  Seizure disorder Managed with Keppra . No recent seizures. - Encouraged neurology follow-up.  Mood disorder Potential benefit from psychiatric evaluation for motivation and medication management. Concerns for potential mood disorder, but no suicidal/homicidal ideation, hallucinations, or focal neurological deficits observed. - Referred to psychiatry for evaluation and management.  PNA vaccine given during OV   Orders as above. F/u prn. The patient voiced understanding and agreement to the plan.  Harlene LITTIE Jolly, DNP, AGNP-C 02/20/24 4:05 PM

## 2024-02-20 NOTE — Assessment & Plan Note (Signed)
 Managed with Keppra . Follows with [facility]. No recent activity.

## 2024-02-20 NOTE — Assessment & Plan Note (Signed)
 EKG WNL. She has been reffered to cardiology for further evaluaiton. Encourage FU with Cardoilogy.

## 2024-02-20 NOTE — Assessment & Plan Note (Signed)
 Behavioral concerns noted; concern for a possible underlying mood disorder. Recommend referral to Psychiatry for comprehensive evaluation.

## 2024-02-20 NOTE — Assessment & Plan Note (Signed)
 Well controlled, no changes to meds. Encouraged heart healthy diet such as the DASH diet and exercise as tolerated.

## 2024-02-24 ENCOUNTER — Other Ambulatory Visit: Payer: Self-pay

## 2024-02-24 NOTE — Patient Outreach (Signed)
 Complex Care Management   Visit Note  02/24/2024  Name:  Ruth Bell MRN: [id number] DOB: 10-03-1960  Situation: Referral received for Complex Care Management related to COPD and Mental/Behavioral Health diagnosis depression and anxiety I obtained verbal consent from Patient.  Visit completed with Patient  on the phone  Background:   Past Medical History:  Diagnosis Date   Anemia    Anxiety    Arm skin lesion, left 09/05/2014   Constipation 12/27/2013   Depression    Dysphagia 04/15/2016   Eczema 08/22/2015   GERD (gastroesophageal reflux disease)    Hematuria 03/04/2016   History of shingles 01/22/2016   Hyperglycemia 01/15/2015   Hyperlipidemia, mixed 08/22/2015   Hypertension    Menometrorrhagia 2006   Migraine, unspecified, without mention of intractable migraine without mention of status migrainosus    Preventative health care 11/04/2016   Pseudoseizures    Seizures (HCC)    history of pseudoseizure disorder, last last seizure 3 wks, keppra  inc in dosage   Tubular adenoma of colon 05/2011   Uterine leiomyoma 2006    Assessment: Patient Reported Symptoms:  Cognitive Cognitive Status: Able to follow simple commands, Alert and oriented to person, place, and time, Normal speech and language skills Cognitive/Intellectual Conditions Management [RPT]: None reported or documented in medical history or problem list   Health Maintenance Behaviors: Annual physical exam, Stress management Health Facilitated by: Rest, Stress management, Prayer/meditation  Neurological Neurological Review of Symptoms: Headaches Oher Neurological Symptoms/Conditions [RPT]: Patient reports no seizures since being home, but she has had a headache in the top in the middle. She reports she has been advised to not take Aleve for pain control due to interaction with Meloxicam  Neurological Management Strategies: Coping strategies, Medication therapy, Routine screening Neurological Comment: Note per  chart review patient has upcoming appointment with neurology 03/25/24 due to history of seizures  HEENT HEENT Symptoms Reported: Mouth or teeth pain HEENT Management Strategies: Coping strategies, Routine screening HEENT Comment: Patient reports one tooth that gives her pain. She reports she has been unable to locate a dentist that can pull it, because they don't do sedation. Patient reports she requires sedation due to her history of seizures. Spoke with patient's husband, who reports they did have [employer] coverage for dental but for some reason it was denied. He reports he is unsure who will be their dental coverage in 2026    Cardiovascular Cardiovascular Symptoms Reported: Chest pain or discomfort, Swelling in legs or feet (BLE swelling, L>R, worse as the day goes on. Improved with elevation) Does patient have uncontrolled Hypertension?: Yes Is patient checking Blood Pressure at home?: Yes Patient's Recent BP reading at home: 125/78 Cardiovascular Management Strategies: Coping strategies, Medication therapy, Routine screening Cardiovascular Comment: Note per chart review that cardiology referral was placed by PCP 02/10/24. Patient reports she has not heard from office to schedule an appointment. 3-way call attempted to schedule appointment, but no answer after being hold for 20+ minutes. Will continue with efforts and provided patient with office contact number via MyChart.  Respiratory Respiratory Symptoms Reported: Other:, Shortness of breath, Productive cough, Chest tightness Other Respiratory Symptoms: Raspy voice. Shortness of breath even with rest. Cough at night when she lays down, has to prop herself up, with tan colored mucus Additional Respiratory Details: Patient reports she is still taking antibiotic and PO steroid. Patient reports using PRN Albuterol  about 3 times a day. Reminded patient of upcoming appointment with pulmonologist 02/25/24. Patient asks about qualifying for oxygen at  home Respiratory Management Strategies: Adequate rest, Breathing exercise, Coping strategies, Medication therapy, Routine screening  Endocrine Endocrine Symptoms Reported: No symptoms reported Is patient diabetic?: No    Gastrointestinal Gastrointestinal Symptoms Reported: Other Other Gastrointestinal Symptoms: Note per chart review patient has had one single episode of blood stool, reported to PCP. Patient reports she is still waiting to receive FOBT in the mail. She denies further episodes of blood in the stool Additional Gastrointestinal Details: Patient reports her appetite is terrible. She reports she has recently been able to gain some weight through protein shakes (2 a day) Gastrointestinal Management Strategies: Coping strategies    Genitourinary Genitourinary Symptoms Reported: Incontinence Genitourinary Management Strategies: Incontinence garment/pad Genitourinary Comment: Note per chart review patient is participating in pelvic floor therapy  Integumentary Integumentary Symptoms Reported: No symptoms reported    Musculoskeletal Musculoskelatal Symptoms Reviewed: Weakness Musculoskeletal Management Strategies: Adequate rest, Coping strategies, Routine screening Musculoskeletal Comment: Patient reports regular exercise through walking Falls in the past year?: No Number of falls in past year: 1 or less Was there an injury with Fall?: No Fall Risk Category Calculator: 0 Patient Fall Risk Level: Low Fall Risk Patient at Risk for Falls Due to: Other (Comment) (History of seizures) Fall risk Follow up: Falls evaluation completed, Education provided, Falls prevention discussed  Psychosocial Psychosocial Symptoms Reported: Depression - if selected complete PHQ 2-9, Anxiety - if selected complete GAD Additional Psychological Details: Patient reports PCP placed referral for psych, but she has not heard from them eyt Behavioral Management Strategies: Support system Major  Change/Loss/Stressor/Fears (CP): Medical condition, self Behaviors When Feeling Stressed/Fearful: find me a movie or something, reading her bible, I cry a lot Techniques to Cope with Loss/Stress/Change: Spiritual practice(s), Diversional activities Quality of Family Relationships: helpful, involved, supportive Do you feel physically threatened by others?: No    02/24/2024    PHQ2-9 Depression Screening   Little interest or pleasure in doing things More than half the days  Feeling down, depressed, or hopeless More than half the days  PHQ-2 - Total Score 4  Trouble falling or staying asleep, or sleeping too much More than half the days  Feeling tired or having little energy More than half the days  Poor appetite or overeating  Nearly every day  Feeling bad about yourself - or that you are a failure or have let yourself or your family down Nearly every day  Trouble concentrating on things, such as reading the newspaper or watching television More than half the days  Moving or speaking so slowly that other people could have noticed.  Or the opposite - being so fidgety or restless that you have been moving around a lot more than usual Nearly every day  Thoughts that you would be better off dead, or hurting yourself in some way Not at all  PHQ2-9 Total Score 19  If you checked off any problems, how difficult have these problems made it for you to do your work, take care of things at home, or get along with other people Not difficult at all  Depression Interventions/Treatment      There were no vitals filed for this visit. Pain Scale: 0-10 Pain Score: 7  Pain Type: Acute pain Pain Location: Head (Radiates to the shoulders)  Medications Reviewed Today     Reviewed by Arno Rosaline SQUIBB, RN (Registered Nurse) on 02/24/24 at 1408  Med List Status: <None>   Medication Order Taking? Sig Documenting Provider Last Dose Status Informant  acetaminophen  (TYLENOL ) 500 MG  tablet [id number]  Take  1,000 mg by mouth every 6 (six) hours as needed for mild pain (pain score 1-3).  Patient not taking: Reported on 02/24/2024   [provider]  Active Self, Pharmacy Records  albuterol  (VENTOLIN  HFA) [id number] Base) MCG/ACT inhaler [id number] Yes Inhale 1-2 puffs into the lungs every 4 (four) hours as needed for wheezing or shortness of breath. Rai, Nydia POUR, MD  Active   amLODipine  (NORVASC ) 5 MG tablet [id number] Yes Take 1 tablet (5 mg total) by mouth daily. Domenica Harlene LABOR, MD  Active   ascorbic acid (VITAMIN C) 100 MG tablet [id number] Yes Take 1 tablet by mouth daily.  Patient taking differently: Take 1,000 mg by mouth every other day.   [provider]  Active Self, Pharmacy Records  cefdinir  (OMNICEF ) 300 MG capsule [id number] Yes Take 1 capsule (300 mg total) by mouth 2 (two) times daily for 7 days. Domenica Harlene LABOR, MD  Active   desloratadine  (CLARINEX ) 5 MG tablet [id number] Yes Take 1 tablet (5 mg total) by mouth daily. Domenica Harlene LABOR, MD  Active   estradiol  (ESTRACE ) 0.01 % CREA vaginal cream [id number] Yes Place 0.5g nightly for two weeks then twice a week after Guadlupe Lianne DASEN, MD  Active   famotidine  (PEPCID ) 20 MG tablet [id number] Yes TAKE 1 TABLET BY MOUTH TWICE A DAY Soldatova, Liuba, MD  Active Self, Pharmacy Records  fluticasone  (FLONASE ) 50 MCG/ACT nasal spray [id number] Yes Place 2 sprays into both nostrils daily. Rai, Nydia POUR, MD  Active   fluticasone -salmeterol (ADVAIR  DISKUS) 250-50 MCG/ACT AEPB [id number] Yes Inhale 1 puff into the lungs 2 (two) times daily. Domenica Harlene LABOR, MD  Active   guaiFENesin  (MUCINEX ) 600 MG 12 hr tablet [id number] Yes Take 1 tablet (600 mg total) by mouth 2 (two) times daily. Rai, Nydia POUR, MD  Active   guaiFENesin -codeine  100-10 MG/5ML syrup [id number] Yes Take 5 mLs by mouth every 6 (six) hours as needed for cough. Rai, Nydia POUR, MD  Active   HYDROcodone  bit-homatropine (HYDROMET) 5-1.5 MG/5ML syrup [id number] Yes Take 5 mLs by mouth  every 6 (six) hours as needed for cough. Domenica Harlene LABOR, MD  Active   hydrOXYzine  (ATARAX ) 10 MG tablet [id number] Yes Take 1 tablet (10 mg total) by mouth every 8 (eight) hours as needed for anxiety. Rai, Nydia POUR, MD  Active   hyoscyamine  (ANASPAZ ) 0.125 MG TBDP disintergrating tablet [id number] Yes Place 1 tablet (0.125 mg total) under the tongue every 4 (four) hours as needed. Domenica Harlene LABOR, MD  Active Self, Pharmacy Records  ketorolac  (TORADOL ) injection 60 mg [id number]   Wheeler Harlene CROME, NP  Active   levETIRAcetam  (KEPPRA ) 500 MG tablet [id number] Yes Take 1 tablet (500 mg total) by mouth 2 (two) times daily. Yacopino, Jessica L, NP  Active   meloxicam  (MOBIC ) 7.5 MG tablet [id number] Yes Take 1 tablet (7.5 mg total) by mouth daily. Yacopino, Jessica L, NP  Active   methylPREDNISolone  (MEDROL ) 4 MG tablet [id number] Yes 5 tabs po x 3 day then 4 tabs po x 3 day then 3 tabs po x 3 day then 2 tabs po x 3 day then 1 tab po x 3 day and stop Domenica Harlene LABOR, MD  Active   metoprolol  succinate (TOPROL -XL) 100 MG 24 hr tablet [id number] Yes Take 1 tablet (100 mg total) by mouth 2 (two) times daily. Take with or immediately following a meal. Domenica Harlene LABOR, MD  Active   ondansetron  (ZOFRAN )  4 MG tablet [id number] Yes Take 1 tablet (4 mg total) by mouth every 8 (eight) hours as needed for nausea or vomiting. Davia Nydia POUR, MD  Active   OVER THE COUNTER MEDICATION [id number] Yes MediKisk Lung Cleansng Spray - spray into mouth as needed. Contains - eucalyptus, peppermint, , licorice root, honeysuckle and calendula. [provider]  Active   trospium  (SANCTURA ) 20 MG tablet [id number] Yes Take 1 tablet (20 mg total) by mouth 2 (two) times daily. Guadlupe Lianne DASEN, MD  Active             Recommendation:   Specialty provider follow-up pulmonology 02/25/24 Referral to: requesting referral to podiatry and psych Continue Current Plan of Care  Follow Up Plan:   Telephone follow up appointment  date/time:  03/09/24 at 1 PM  Rosaline Finlay, RN MSN [place]  [facility] RN Care Manager Direct Dial: [phone]  Fax: [phone]

## 2024-02-24 NOTE — Patient Instructions (Signed)
 Visit Information  Thank you for taking time to visit with me today. Please don't hesitate to contact me if I can be of assistance to you before our next scheduled appointment.  Our next appointment is by telephone on 03/09/24 at 1 PM Please call the care guide team at [phone] if you need to cancel or reschedule your appointment.   Following is a copy of your care plan:   Goals Addressed             This Visit's Progress    [facility] RN Care Plan   On track    Problems:  Chronic Disease Management support and education needs related to Anxiety, COPD, and Depression  Goal: Over the next 30 days the Patient will demonstrate a decrease COPD in exacerbations as evidenced by patient report of stable respiratory symptoms, attending visit with pulmonology and following their recommendations Schedule appointment with cardiology due to report of chest pain as evidenced by patient report and/or chart review Schedule appointment with behavioral health/psych due to report of depression and anxiety as evidenced by patient report and/or chart review Confirm dental coverage for 2026 as evidenced by patient report  Interventions:   COPD Interventions: Advised patient to engage in light exercise as tolerated 3-5 days a week to aid in the the management of COPD Advised patient to self assesses COPD action plan zone and make appointment with provider if in the yellow zone for 48 hours without improvement Discussed the importance of adequate rest and management of fatigue with COPD Provided instruction about proper use of medications used for management of COPD including inhalers Provided patient with basic written and verbal COPD education on self care/management/and exacerbation prevention Reminded patient of upcoming appointment with pulmonology 02/25/24 Patient asks about the process for being approved for home-oxygen use. Educated patient on walk test to assess for home oxygen need, and advised she  discuss further with pulmonology at upcoming anppointment   Evaluation of current treatment plan related to Anxiety and Depression self-management and patient's adherence to plan as established by provider. Discussed plans with patient for ongoing care management follow up and provided patient with direct contact information for care management team Advised patient to contact insurance agent to ensure dental coverage has been selected for 2026 Screening for signs and symptoms of depression related to chronic disease state  Assessed social determinant of health barriers Message sent to PCP regarding PHQ-9 score of 19 and GAD-7 score of 15. Psych referral not seen on chart review, requesting referral to be placed. Also requesting referral for podiatry per patient 3-way call attempted to cardiology [facility]) per referral. Unable to reach representative after being on hold for 20+ minutes. Option for office to call patient back selected, and provided patient with phone number [phone] to continue efforts of scheduling appointment  Patient Self-Care Activities:  Attend all scheduled provider appointments Attend church or other social activities Call provider office for new concerns or questions  Perform all self care activities independently  Take medications as prescribed   limit outdoor activity during cold weather do breathing exercises every day eliminate symptom triggers at home follow rescue plan if symptoms flare-up  Plan:  Telephone follow up appointment with care management team member scheduled for:  03/09/24 at 1 PM             Please call the Suicide and Crisis Lifeline: 988 call 1-800-273-TALK (toll free, 24 hour hotline) if you are experiencing a Mental Health  or Behavioral Health Crisis or need someone to talk to.  Patient verbalized understanding of Care plan and visit instructions communicated this visit  Rosaline Finlay, RN  MSN [place]  [facility] RN Care Manager Direct Dial: [phone]  Fax: [phone]  COPD Action Plan A COPD action plan is a description of what to do when you have a flare (exacerbation) of chronic obstructive pulmonary disease (COPD). Your action plan is a color-coded plan that lists the symptoms that indicate whether your condition is under control and what actions to take. If you have symptoms in the green zone, it means you are doing well that day. If you have symptoms in the yellow zone, it means you are having a bad day or an exacerbation. If you have symptoms in the red zone, you need urgent medical care. Follow the plan that you and your health care provider developed. Review your plan with your health care provider at each visit. Red zone Symptoms in this zone mean that you should get medical help right away. They include: Feeling very short of breath, even when you are resting. Not being able to do any activities because of poor breathing. Not being able to sleep because of poor breathing. Fever or shaking chills. Feeling confused or very sleepy. Chest pain. Coughing up blood. If you have any of these symptoms, call emergency services (911 in the [place] or go to the nearest emergency room. Yellow zone Symptoms in this zone mean that your condition may be getting worse. They include: Feeling more short of breath than usual. Having less energy for daily activities than usual. Phlegm or mucus that is thicker than usual. Needing to use your rescue inhaler or nebulizer more often than usual. More ankle swelling than usual. Coughing more than usual. Feeling like you have a chest cold. Trouble sleeping due to COPD symptoms. Decreased appetite. COPD medicines not helping as much as usual. If you experience any yellow symptoms: Keep taking your daily medicines as directed. Use your quick-relief inhaler as told by your health care provider. If you were  prescribed steroid medicine to take by mouth (oral medicine), start taking it as told by your health care provider. If you were prescribed an antibiotic medicine, start taking it as told by your health care provider. Do not stop taking the antibiotic even if you start to feel better. Use oxygen as told by your health care provider. Get more rest. Do your pursed-lip breathing exercises. Do not smoke. Avoid any irritants in the air. If your signs and symptoms do not improve after taking these steps, call your health care provider right away. Green zone Symptoms in this zone mean that you are doing well. They include: Being able to do your usual activities and exercise. Having the usual amount of coughing, including the same amount of phlegm or mucus. Being able to sleep well. Having a good appetite. Where to find more information: You can find more information about COPD from: American Lung Association, My COPD Action Plan: www.lung.org COPD Foundation: www.copdfoundation.org National Heart, Lung, & Blood Institute: popsteam.is Follow these instructions at home: Continue taking your daily medicines as told by your health care provider. Make sure you receive all the immunizations that your health care provider recommends, especially the pneumococcal and influenza vaccines. Wash your hands often with soap and water. Have family members wash their hands too. Regular hand washing can help prevent infections. Follow your usual exercise and diet plan. Avoid irritants in the air, such  as smoke. Do not use any products that contain nicotine  or tobacco. These products include cigarettes, chewing tobacco, and vaping devices, such as e-cigarettes. If you need help quitting, ask your health care provider. Summary A COPD action plan tells you what to do when you have a flare (exacerbation) of chronic obstructive pulmonary disease (COPD). Follow each action plan for your symptoms. If you have any  symptoms in the red zone, call emergency services (911 in the [place] or go to the nearest emergency room. This information is not intended to replace advice given to you by your health care provider. Make sure you discuss any questions you have with your health care provider. Document Revised: 01/17/2023 Document Reviewed: 01/17/2023 Elsevier Patient Education  2024 [employer].

## 2024-02-25 ENCOUNTER — Ambulatory Visit

## 2024-02-25 ENCOUNTER — Other Ambulatory Visit: Payer: Self-pay | Admitting: Family Medicine

## 2024-02-25 VITALS — BP 125/83 | HR 70 | Temp 98.0°F | Ht 62.0 in | Wt 107.0 lb

## 2024-02-25 DIAGNOSIS — L602 Onychogryphosis: Secondary | ICD-10-CM

## 2024-02-25 DIAGNOSIS — R079 Chest pain, unspecified: Secondary | ICD-10-CM

## 2024-02-25 DIAGNOSIS — J449 Chronic obstructive pulmonary disease, unspecified: Secondary | ICD-10-CM

## 2024-02-25 MED ORDER — ALBUTEROL SULFATE HFA 108 (90 BASE) MCG/ACT IN AERS: 1.0000 | INHALATION_SPRAY | Freq: Four times a day (QID) | RESPIRATORY_TRACT | 6 refills | Status: AC | PRN

## 2024-02-25 MED ORDER — BREZTRI AEROSPHERE 160-9-4.8 MCG/ACT IN AERO: 2.0000 | INHALATION_SPRAY | Freq: Two times a day (BID) | RESPIRATORY_TRACT | 6 refills | Status: AC

## 2024-02-25 MED ORDER — BREZTRI AEROSPHERE 160-9-4.8 MCG/ACT IN AERO: 2.0000 | INHALATION_SPRAY | Freq: Two times a day (BID) | RESPIRATORY_TRACT | Status: AC

## 2024-02-25 NOTE — Assessment & Plan Note (Deleted)
  Orders:   Pulmonary Function Test; Future   Ambulatory referral to ENT   Ambulatory Referral for Lung Cancer Scre

## 2024-02-25 NOTE — Patient Instructions (Addendum)
 Dear Ruth Bell;  I will recommend the following for your COPD: -Breztri , 2 puffs twice a day. -Albuterol  as need for shortness of breath -I will do a pulmonary function test -I highly recommend to decreased the number of cigarettes over time. -I will send a referral for ENT given your hoarseness. -I send a referral for a lung cancer screening. I will see you in 2 months   You have chest pain worsening over the last 4 days, I highly recommend you to go to the ER if this is worsen. Your EKG was normal today.                            Contains text generated by [employer].                                 Contains text generated by [employer]bridge.

## 2024-02-25 NOTE — Progress Notes (Signed)
 New Patient Pulmonology Office Visit   Subjective:  Patient ID: Ruth Bell, female    DOB: [birth year]  MRN: [id number]  Referred by: Domenica Harlene LABOR, MD  CC:  Chief Complaint  Patient presents with   Consult   COPD    When walking and night. Started October 2025    Discussed the use of AI scribe software for clinical note transcription with the patient, who gave verbal consent to proceed.  History of Present Illness Ruth Bell is a 63 year old female with COPD who presents with after a recent COPD flare on 01/06/24.  Approximately two months ago on 12/2023 she had a COPD flare requiring hospitalization. Since then she notes home oxygen readings as low as 60s% that recover quickly. She uses daily inhalers including albuterol  twice a day with partial relief. She has not used prednisone  or other steroids for these symptoms. She had one COPD hospitalization in the past 2-3 years.  She has smoked since age 64-15 yo and still smokes 3-4 cigarettes daily. She has nocturnal wheezing and must sleep propped up to breathe comfortably. She has increasing dark mucus and a persistent mainly nocturnal cough.  Since the hospitalization she has had persistent hoarseness without improvement. She feels short of breath when speaking and needs to squeeze to get words out.  For the past four days she has had chest tightness with pain radiating down her arm to her fingers. The pain is persistent and she has not sought emergency care and she does not want to go to the ER. She reported to have a cardiology evaluation next week, and an echocardiogram was ordered.  She has marked exertional dyspnea and must rest after walking short distances inside her home. She denied to have used inhaler in the past and she has started recently after the admission. Denied steroids use.   ROS as above  Allergies: Patient has no known allergies.  Current Outpatient Medications:    acetaminophen  (TYLENOL ) 500 MG  tablet, Take 1,000 mg by mouth every 6 (six) hours as needed for mild pain (pain score 1-3). (Patient not taking: Reported on 02/24/2024), Disp: , Rfl:    albuterol  (VENTOLIN  HFA) 108 (90 Base) MCG/ACT inhaler, Inhale 1-2 puffs into the lungs every 4 (four) hours as needed for wheezing or shortness of breath., Disp: 6.7 g, Rfl: 1   amLODipine  (NORVASC ) 5 MG tablet, Take 1 tablet (5 mg total) by mouth daily., Disp: 90 tablet, Rfl: 0   ascorbic acid (VITAMIN C) 100 MG tablet, Take 1 tablet by mouth daily. (Patient taking differently: Take 1,000 mg by mouth every other day.), Disp: , Rfl:    cefdinir  (OMNICEF ) 300 MG capsule, Take 1 capsule (300 mg total) by mouth 2 (two) times daily for 7 days., Disp: 14 capsule, Rfl: 0   desloratadine  (CLARINEX ) 5 MG tablet, Take 1 tablet (5 mg total) by mouth daily., Disp: 90 tablet, Rfl: 0   estradiol  (ESTRACE ) 0.01 % CREA vaginal cream, Place 0.5g nightly for two weeks then twice a week after, Disp: 42.5 g, Rfl: 3   famotidine  (PEPCID ) 20 MG tablet, TAKE 1 TABLET BY MOUTH TWICE A DAY, Disp: 180 tablet, Rfl: 1   fluticasone  (FLONASE ) 50 MCG/ACT nasal spray, Place 2 sprays into both nostrils daily., Disp: 16 g, Rfl: 6   fluticasone -salmeterol (ADVAIR  DISKUS) 250-50 MCG/ACT AEPB, Inhale 1 puff into the lungs 2 (two) times daily., Disp: 60 each, Rfl: 2   guaiFENesin  (MUCINEX ) 600 MG 12 hr  tablet, Take 1 tablet (600 mg total) by mouth 2 (two) times daily., Disp: 60 tablet, Rfl: 0   guaiFENesin -codeine  100-10 MG/5ML syrup, Take 5 mLs by mouth every 6 (six) hours as needed for cough., Disp: 120 mL, Rfl: 0   HYDROcodone  bit-homatropine (HYDROMET) 5-1.5 MG/5ML syrup, Take 5 mLs by mouth every 6 (six) hours as needed for cough., Disp: 180 mL, Rfl: 0   hydrOXYzine  (ATARAX ) 10 MG tablet, Take 1 tablet (10 mg total) by mouth every 8 (eight) hours as needed for anxiety., Disp: 30 tablet, Rfl: 0   hyoscyamine  (ANASPAZ ) 0.125 MG TBDP disintergrating tablet, Place 1 tablet (0.125 mg  total) under the tongue every 4 (four) hours as needed., Disp: 30 tablet, Rfl: 1   levETIRAcetam  (KEPPRA ) 500 MG tablet, Take 1 tablet (500 mg total) by mouth 2 (two) times daily., Disp: 180 tablet, Rfl: 1   meloxicam  (MOBIC ) 7.5 MG tablet, Take 1 tablet (7.5 mg total) by mouth daily., Disp: 15 tablet, Rfl: 0   methylPREDNISolone  (MEDROL ) 4 MG tablet, 5 tabs po x 3 day then 4 tabs po x 3 day then 3 tabs po x 3 day then 2 tabs po x 3 day then 1 tab po x 3 day and stop, Disp: 45 tablet, Rfl: 0   metoprolol  succinate (TOPROL -XL) 100 MG 24 hr tablet, Take 1 tablet (100 mg total) by mouth 2 (two) times daily. Take with or immediately following a meal., Disp: 180 tablet, Rfl: 0   ondansetron  (ZOFRAN ) 4 MG tablet, Take 1 tablet (4 mg total) by mouth every 8 (eight) hours as needed for nausea or vomiting., Disp: 30 tablet, Rfl: 0   OVER THE COUNTER MEDICATION, MediKisk Lung Cleansng Spray - spray into mouth as needed. Contains - eucalyptus, peppermint, , licorice root, honeysuckle and calendula., Disp: , Rfl:    trospium  (SANCTURA ) 20 MG tablet, Take 1 tablet (20 mg total) by mouth 2 (two) times daily., Disp: 60 tablet, Rfl: 2  Current Facility-Administered Medications:    ketorolac  (TORADOL ) injection 60 mg, 60 mg, Intramuscular, Once, Wheeler Harlene CROME, NP Past Medical History:  Diagnosis Date   Anemia    Anxiety    Arm skin lesion, left 09/05/2014   Constipation 12/27/2013   Depression    Dysphagia 04/15/2016   Eczema 08/22/2015   GERD (gastroesophageal reflux disease)    Hematuria 03/04/2016   History of shingles 01/22/2016   Hyperglycemia 01/15/2015   Hyperlipidemia, mixed 08/22/2015   Hypertension    Menometrorrhagia 2006   Migraine, unspecified, without mention of intractable migraine without mention of status migrainosus    Preventative health care 11/04/2016   Pseudoseizures    Seizures (HCC)    history of pseudoseizure disorder, last last seizure 3 wks, keppra  inc in dosage    Tubular adenoma of colon 05/2011   Uterine leiomyoma 2006   Past Surgical History:  Procedure Laterality Date   APPENDECTOMY     BILATERAL SALPINGOOPHORECTOMY  04/06/2004   CARDIAC EVENT MONITOR  02/2016   Mostly sinus bradycardia and sinus rhythm.  Rare PVCs and PACs.  No arrhythmias.  Symptoms of chest pain and fluttering as well as passed out spell noted with normal sinus rhythm.   INCONTINENCE SURGERY     MASS EXCISION Left 06/02/2015   Procedure: EXCISION LEFT ARM MASS;  Surgeon: Deward Null III, MD;  Location: [facility];  Service: General;  Laterality: Left;   NM MYOVIEW  LTD  04/2016   Reached heart rate of 127 bpm with  Lexiscan .  EF 60 to 65%.  LOW RISK.  No ischemia or infarction.   ROTATOR CUFF REPAIR     TOTAL LAPAROSCOPIC HYSTERECTOMY WITH BILATERAL SALPINGO OOPHORECTOMY  04/06/2004   TRANSTHORACIC ECHOCARDIOGRAM  01/2016   F 55-6%.  No R WMA.  GR 1 DD.  Normal valves.   Family History  Problem Relation Age of Onset   Heart disease Mother    Heart attack Mother    Cancer Father        colon   Hypertension Father    Brain cancer Sister    Hypertension Sister    Cancer Sister        unsure of type   Diabetes Sister        type 2   Heart attack Sister    Cancer Brother        unknown type   Hypertension Brother    Colon cancer Neg Hx    Esophageal cancer Neg Hx    Pancreatic cancer Neg Hx    Stomach cancer Neg Hx    Colon polyps Neg Hx    Bladder Cancer Neg Hx    Renal cancer Neg Hx    Social History   Socioeconomic History   Marital status: Married    Spouse name: Elsie   Number of children: 2   Years of education: Not on file   Highest education level: Not on file  Occupational History   Occupation: CMA-retired  Tobacco Use   Smoking status: Former    Current packs/day: 0.50    Average packs/day: 0.5 packs/day for 40.4 years (20.2 ttl pk-yrs)    Types: Cigarettes    Start date: 12/18/2015   Smokeless tobacco: Never   Tobacco  comments:    3-4 cigarettes daily  Vaping Use   Vaping status: Never Used  Substance and Sexual Activity   Alcohol use: Yes    Comment: wine occassioanlly    Drug use: No   Sexual activity: Not on file    Comment: lives with husband, no dietary restrictions.   Other Topics Concern   Not on file  Social History Narrative   Lives with her husband and their son.   Daughter lives in [place], [place].   Right handed   Social Drivers of Health   Financial Resource Strain: Not on file  Food Insecurity: No Food Insecurity (02/24/2024)   Hunger Vital Sign    Worried About Running Out of Food in the Last Year: Never true    Ran Out of Food in the Last Year: Never true  Transportation Needs: No Transportation Needs (02/24/2024)   PRAPARE - Administrator, Civil Service (Medical): No    Lack of Transportation (Non-Medical): No  Physical Activity: Not on file  Stress: Not on file  Social Connections: Moderately Integrated (01/07/2024)   Social Connection and Isolation Panel    Frequency of Communication with Friends and Family: More than three times a week    Frequency of Social Gatherings with Friends and Family: More than three times a week    Attends Religious Services: More than 4 times per year    Active Member of [employer] or Organizations: No    Attends Banker Meetings: Never    Marital Status: Married  Catering Manager Violence: Not At Risk (02/24/2024)   Humiliation, Afraid, Rape, and Kick questionnaire    Fear of Current or Ex-Partner: No    Emotionally Abused: No    Physically Abused: No  Sexually Abused: No       Objective:  LMP  (LMP Unknown)  Wt Readings from Last 3 Encounters:  02/25/24 107 lb (48.5 kg)  02/20/24 107 lb 9.6 oz (48.8 kg)  02/14/24 105 lb (47.6 kg)   BMI Readings from Last 3 Encounters:  02/25/24 19.57 kg/m  02/20/24 19.18 kg/m  02/14/24 18.72 kg/m   SpO2 Readings from Last 3 Encounters:  02/25/24 98%  02/20/24 98%   02/10/24 96%    Physical Exam Constitutional:      Comments: cachectic  HENT:     Mouth/Throat:     Mouth: Mucous membranes are moist.  Eyes:     Pupils: Pupils are equal, round, and reactive to light.  Cardiovascular:     Rate and Rhythm: Normal rate and regular rhythm.     Heart sounds: Normal heart sounds.  Pulmonary:     Effort: Pulmonary effort is normal.     Breath sounds: Normal breath sounds.     Comments: No wheezing. Neurological:     Mental Status: She is alert.    Diagnostic Review:   CT chest wo con 11/12/22 1. Stable 4 mm right upper lobe pulmonary nodule, benign given long-term stability since 2011. This likely accounts for chest x-ray finding. Chronic subpleural scarring at the right apex also benign given lack of change since 2011. No further imaging follow-up is recommended. 2. No acute intrathoracic process. 3. Aortic Atherosclerosis (ICD10-I70.0) and Emphysema (ICD10-J43.9).  EKG today 02/25/24> did not show ischemic changes.    Assessment & Plan:   Assessment & Plan Chronic obstructive pulmonary disease Current smoker. COPD exacerbation in October. Symptoms: dyspnea, wheezing, mucus production. Reported her oxygen sat has dropped at home, however today her o2 sat is 98%. Smoking history of 50 years, currently 3-4 cigarettes/day. Current inhaler regimen suboptimal. - Ordered pulmonary function test. - Prescribed Breztri  inhaler, two puffs morning and night. - I will consider add spiriva to advair  if the breztri  is not covered by the insurance. - Provided Breztri  samples until insurance approval. - Sent refills for albuterol  inhaler. - Encouraged smoking cessation with gradual reduction. - Enrolled in the lung cancer screening program.  Chest pain Intermittent chest pain radiating to shoulder for four days. Concern for cardiac etiology. Recent chest x-ray and CT scan unremarkable. I performed an EKG today which it was negative for ischemic changes.  I highly recommend to the patient if this get worst she needs to go to the ER, but she has declined. She tells me she has a cardiology evaluation next week and an echo ordered it. - Patient will follow up with cardiology next week.  Dysphonia Persistent hoarseness since hospitalization. I will sent a referral to ENT for evaluation of vocal cord nodules or malignancy given her\ smoking history. - Referred to ENT for vocal cord evaluation.  Return in about 2 months (around 04/27/2024).    Marny Patch, MD Pulmonary and Critical Care Medicine [facility]

## 2024-02-25 NOTE — Assessment & Plan Note (Deleted)
 Orders:    EKG 12 Lead

## 2024-02-28 ENCOUNTER — Encounter (INDEPENDENT_AMBULATORY_CARE_PROVIDER_SITE_OTHER): Payer: Self-pay

## 2024-03-02 ENCOUNTER — Other Ambulatory Visit: Payer: Self-pay | Admitting: Student

## 2024-03-02 ENCOUNTER — Telehealth: Payer: Self-pay | Admitting: Family Medicine

## 2024-03-02 DIAGNOSIS — R079 Chest pain, unspecified: Secondary | ICD-10-CM

## 2024-03-02 NOTE — Telephone Encounter (Signed)
 Pt dropped off papers from [employer] to be filled out by pcp. Left papers in pcps box. Please call pt when papers have been faxed and are ready to be picked up.

## 2024-03-02 NOTE — Telephone Encounter (Signed)
 FMLA forms was placed in PCP box to be completed and patient is aware that she have forms to complete.

## 2024-03-04 ENCOUNTER — Telehealth: Payer: Self-pay | Admitting: Family Medicine

## 2024-03-04 ENCOUNTER — Encounter: Payer: Self-pay | Admitting: Emergency Medicine

## 2024-03-04 NOTE — Telephone Encounter (Unsigned)
 Copied from CRM #[id number]. Topic: General - Other >> Mar 04, 2024 12:18 PM Eva FALCON wrote: Reason for CRM: Pt states she brought some paperwork for short term disability for provider to fill out, states provider faxed them to [facility]. [facility] has now reached out and said they have not received anything. Could someone look into this and refax if possible. Please call Ariyannah at [phone] if possible.

## 2024-03-05 NOTE — Telephone Encounter (Signed)
 Called pt and no answer or voicemail set up.

## 2024-03-06 ENCOUNTER — Encounter (INDEPENDENT_AMBULATORY_CARE_PROVIDER_SITE_OTHER): Payer: Self-pay

## 2024-03-10 ENCOUNTER — Other Ambulatory Visit

## 2024-03-10 NOTE — Patient Instructions (Signed)
 Visit Information  Thank you for taking time to visit with me today. Please don't hesitate to contact me if I can be of assistance to you before our next scheduled appointment.  Your next care management appointment is by telephone on 03/24/24 at 2 PM  Please call the care guide team at [phone] if you need to cancel, schedule, or reschedule an appointment.   Please call the Suicide and Crisis Lifeline: 988 call 1-800-273-TALK (toll free, 24 hour hotline) if you are experiencing a Mental Health or Behavioral Health Crisis or need someone to talk to.  Rosaline Finlay, RN MSN [place]  [facility]BC[facility] Population Health RN Care Manager Direct Dial: [phone]  Fax: [phone]  Following is a copy of your care plan:   Goals Addressed             This Visit's Progress    VBCI RN Care Plan   On track    Problems:  Chronic Disease Management support and education needs related to Anxiety, COPD, and Depression  Goal: Over the next 30 days the Patient will demonstrate a decrease COPD in exacerbations as evidenced by patient report of stable respiratory symptoms, compliance with ordered inhalers Complete echocardiogram as evidenced by patient report and/or chart review Schedule appointment with behavioral health/psych due to report of depression and anxiety as evidenced by patient report and/or chart review Confirm dental coverage for 2026 as evidenced by patient report  Interventions:   COPD Interventions: Discussed the importance of adequate rest and management of fatigue with COPD Provided instruction about proper use of medications used for management of COPD including inhalers Provided patient with basic written and verbal COPD education on self care/management/and exacerbation prevention Reviewed recent office visit with pulmonology and reviewed recommendations. Ensured patient is taking new prescription Breztri  as ordered and advised to rinse her mouth after use. Ensured  patient is aware of scheduled date for PFT 3-way call placed to ENT office as ordered by pulmonologist. Patient scheduled   Evaluation of current treatment plan related to Anxiety and Depression self-management and patient's adherence to plan as established by provider. Discussed plans with patient for ongoing care management follow up and provided patient with direct contact information for care management team Advised patient to contact insurance agent to ensure dental coverage has been selected for 2026 Patient reports she has not heard from behavioral health referral. Will continue to review chart to see where referral was sent, and will attempt a 3-way call to assist in scheduling at follow-up Ensured patient has been able to get in contact with cardiology office and is scheduled for follow-up as advised 3-way call placed to podiatry office and patient scheduled  Patient Self-Care Activities:  Attend all scheduled provider appointments Attend church or other social activities Call provider office for new concerns or questions  Perform all self care activities independently  Take medications as prescribed   limit outdoor activity during cold weather do breathing exercises every day eliminate symptom triggers at home follow rescue plan if symptoms flare-up  Plan:  Telephone follow up appointment with care management team member scheduled for:  03/24/24 at 2 PM              Patient verbalized understanding of Care plan and visit instructions communicated this visit

## 2024-03-10 NOTE — Patient Outreach (Signed)
 Complex Care Management   Visit Note  03/10/2024  Name:  Ruth Bell MRN: [id number] DOB: [birth year]  Situation: Referral received for Complex Care Management related to COPD I obtained verbal consent from Patient.  Visit completed with Patient  on the phone  Background:   Past Medical History:  Diagnosis Date   Anemia    Anxiety    Arm skin lesion, left 09/05/2014   Constipation 12/27/2013   Depression    Dysphagia 04/15/2016   Eczema 08/22/2015   GERD (gastroesophageal reflux disease)    Hematuria 03/04/2016   History of shingles 01/22/2016   Hyperglycemia 01/15/2015   Hyperlipidemia, mixed 08/22/2015   Hypertension    Menometrorrhagia 2006   Migraine, unspecified, without mention of intractable migraine without mention of status migrainosus    Preventative health care 11/04/2016   Pseudoseizures    Seizures (HCC)    history of pseudoseizure disorder, last last seizure 3 wks, keppra  inc in dosage   Tubular adenoma of colon 05/2011   Uterine leiomyoma 2006    Assessment: Patient Reported Symptoms:  Cognitive Cognitive Status: Able to follow simple commands, Alert and oriented to person, place, and time, Normal speech and language skills Cognitive/Intellectual Conditions Management [RPT]: None reported or documented in medical history or problem list   Health Maintenance Behaviors: Annual physical exam, Stress management Health Facilitated by: Prayer/meditation, Rest, Stress management  Neurological Neurological Review of Symptoms: Headaches Oher Neurological Symptoms/Conditions [RPT]: Paitent reports headaches have continued. She reports she has not found anything that helps to relieve headaches, so she just has to ride it out Neurological Management Strategies: Coping strategies, Medication therapy, Routine screening Neurological Comment: Patient denies seizures since previous [facility] visit  HEENT HEENT Symptoms Reported: Other: HEENT Management Strategies: Coping  strategies, Routine screening HEENT Comment: Patient reports her husband has not had a chance to contact insurance agent to verify dental coverage into 2026    Cardiovascular Cardiovascular Symptoms Reported: Chest pain or discomfort, Swelling in legs or feet Does patient have uncontrolled Hypertension?: No Cardiovascular Management Strategies: Coping strategies, Medication therapy, Routine screening Cardiovascular Comment: Note per chart review patient is scheduled for ECHO 03/16/25 with cardiology follow-up appointment 05/20/24  Respiratory Respiratory Symptoms Reported: Chest tightness, Productive cough, Shortness of breath, Other: Other Respiratory Symptoms: Patient reports continued cough at night with tan colored mucus, shortness of breath, and chest tightness. Additional Respiratory Details: Note per chart review patient has had follow-up with pulmonologist since previous visit. Reminded patient of PFTs scheduled 05/06/24. Patient reports she did discuss home oxygen with pulmonologist, who will address at next appointment Respiratory Management Strategies: Adequate rest, Breathing exercise, Coping strategies, Medication therapy, Routine screening  Endocrine Endocrine Symptoms Reported: Not assessed    Gastrointestinal Gastrointestinal Symptoms Reported: Other Other Gastrointestinal Symptoms: Patient reports she still has not received FOBT in the mail. She denies blood in the stool since previous CMRN visit Gastrointestinal Management Strategies: Coping strategies, Diet modification    Genitourinary Genitourinary Symptoms Reported: Not assessed    Integumentary Integumentary Symptoms Reported: No symptoms reported Skin Comment: Note per chart review podiatry referral placed 02/25/24. 3-way call placed to [facility] and patient scheduled  Musculoskeletal Musculoskelatal Symptoms Reviewed: Weakness Musculoskeletal Management Strategies: Adequate rest, Coping strategies, Routine  screening Musculoskeletal Comment: Patient reports she had a fall yesterday when she was walking in her friend's yard. She isn't sure what happened that caused the fall. Patient denies injury from falling, but does report she is sore in her left wrist today  from trying to catch herself from falling Falls in the past year?: Yes Number of falls in past year: 1 or less Was there an injury with Fall?: No Fall Risk Category Calculator: 1 Patient Fall Risk Level: Low Fall Risk Patient at Risk for Falls Due to: Other (Comment) (History of seizures) Fall risk Follow up: Falls evaluation completed, Education provided, Falls prevention discussed  Psychosocial Psychosocial Symptoms Reported: Not assessed Additional Psychological Details: Patient reports she has not heard from office regarding psych referral. Per PCP, referral was placed 02/20/24, unable to see where it was sent          03/10/2024    PHQ2-9 Depression Screening   Little interest or pleasure in doing things    Feeling down, depressed, or hopeless    PHQ-2 - Total Score    Trouble falling or staying asleep, or sleeping too much    Feeling tired or having little energy    Poor appetite or overeating     Feeling bad about yourself - or that you are a failure or have let yourself or your family down    Trouble concentrating on things, such as reading the newspaper or watching television    Moving or speaking so slowly that other people could have noticed.  Or the opposite - being so fidgety or restless that you have been moving around a lot more than usual    Thoughts that you would be better off dead, or hurting yourself in some way    PHQ2-9 Total Score    If you checked off any problems, how difficult have these problems made it for you to do your work, take care of things at home, or get along with other people    Depression Interventions/Treatment      There were no vitals filed for this visit. Pain Scale: 0-10 Pain Score: 6   Pain Type: Acute pain Pain Location: Wrist Pain Orientation: Left Pain Descriptors / Indicators: Sore  Medications Reviewed Today     Reviewed by Arno Rosaline SQUIBB, RN (Registered Nurse) on 03/10/24 at 1311  Med List Status: <None>   Medication Order Taking? Sig Documenting Provider Last Dose Status Informant  acetaminophen  (TYLENOL ) 500 MG tablet [id number]  Take 1,000 mg by mouth every 6 (six) hours as needed for mild pain (pain score 1-3). [provider]  Active Self, Pharmacy Records  albuterol  (VENTOLIN  HFA) [phone] Base) MCG/ACT inhaler [id number]  Inhale 1-2 puffs into the lungs every 6 (six) hours as needed for wheezing or shortness of breath. Adrien Guan, Tamala, MD  Active   amLODipine  (NORVASC ) 5 MG tablet [id number]  Take 1 tablet (5 mg total) by mouth daily. Domenica Harlene LABOR, MD  Active   ascorbic acid (VITAMIN C) 100 MG tablet [id number]  Take 1 tablet by mouth daily.  Patient taking differently: Take 1,000 mg by mouth every other day.   [provider]  Active Self, Pharmacy Records  budesonide -glycopyrrolate -formoterol  (BREZTRI  AEROSPHERE) 160-9-4.8 MCG/ACT AERO inhaler [id number] Yes Inhale 2 puffs into the lungs in the morning and at bedtime. Adrien Guan, Tamala, MD  Active   budesonide -glycopyrrolate -formoterol  (BREZTRI  AEROSPHERE) 160-9-4.8 MCG/ACT AERO inhaler [id number] Yes Inhale 2 puffs into the lungs in the morning and at bedtime. Adrien Guan, Tamala, MD  Active   desloratadine  (CLARINEX ) 5 MG tablet [id number]  Take 1 tablet (5 mg total) by mouth daily. Domenica Harlene LABOR, MD  Active   estradiol  (ESTRACE ) 0.01 % CREA vaginal cream [id number]  Place 0.5g  nightly for two weeks then twice a week after Guadlupe Dull T, MD  Active   famotidine  (PEPCID ) 20 MG tablet [id number]  TAKE 1 TABLET BY MOUTH TWICE A DAY Soldatova, Liuba, MD  Active Self, Pharmacy Records  fluticasone  (FLONASE ) 50 MCG/ACT nasal spray [id number]  Place 2 sprays into both nostrils  daily. Rai, Nydia POUR, MD  Active   guaiFENesin  (MUCINEX ) 600 MG 12 hr tablet [id number]  Take 1 tablet (600 mg total) by mouth 2 (two) times daily. Rai, Nydia POUR, MD  Active   guaiFENesin -codeine  100-10 MG/5ML syrup [id number]  Take 5 mLs by mouth every 6 (six) hours as needed for cough. Rai, Nydia POUR, MD  Active   HYDROcodone  bit-homatropine (HYDROMET) 5-1.5 MG/5ML syrup [id number]  Take 5 mLs by mouth every 6 (six) hours as needed for cough. Domenica Harlene LABOR, MD  Active   hydrOXYzine  (ATARAX ) 10 MG tablet [id number]  Take 1 tablet (10 mg total) by mouth every 8 (eight) hours as needed for anxiety. Rai, Nydia POUR, MD  Active   hyoscyamine  (ANASPAZ ) 0.125 MG TBDP disintergrating tablet [id number]  Place 1 tablet (0.125 mg total) under the tongue every 4 (four) hours as needed. Domenica Harlene LABOR, MD  Active Self, Pharmacy Records  levETIRAcetam  (KEPPRA ) 500 MG tablet [id number]  Take 1 tablet (500 mg total) by mouth 2 (two) times daily. Yacopino, Jessica L, NP  Active   meloxicam  (MOBIC ) 7.5 MG tablet [id number]  TAKE 1 TABLET BY MOUTH EVERY DAY Webb, Padonda B, FNP  Active   methylPREDNISolone  (MEDROL ) 4 MG tablet [id number]  5 tabs po x 3 day then 4 tabs po x 3 day then 3 tabs po x 3 day then 2 tabs po x 3 day then 1 tab po x 3 day and stop Domenica Harlene LABOR, MD  Active   metoprolol  succinate (TOPROL -XL) 100 MG 24 hr tablet [id number]  Take 1 tablet (100 mg total) by mouth 2 (two) times daily. Take with or immediately following a meal. Domenica Harlene LABOR, MD  Active   ondansetron  (ZOFRAN ) 4 MG tablet [id number]  Take 1 tablet (4 mg total) by mouth every 8 (eight) hours as needed for nausea or vomiting. Davia Nydia POUR, MD  Active   OVER THE COUNTER MEDICATION [id number]  MediKisk Lung Cleansng Spray - spray into mouth as needed. Contains - eucalyptus, peppermint, , licorice root, honeysuckle and calendula. [provider]  Active   trospium  (SANCTURA ) 20 MG tablet [id number]  Take 1 tablet (20 mg total) by  mouth 2 (two) times daily. Guadlupe Dull DASEN, MD  Active             Recommendation:   Continue Current Plan of Care  Follow Up Plan:   Telephone follow up appointment date/time:  03/24/24 at 2 PM  Rosaline Finlay, RN MSN [place]  [facility] RN Care Manager Direct Dial: [phone]  Fax: [phone]

## 2024-03-10 NOTE — Telephone Encounter (Signed)
 Called pt and no answer or could not leave a message.

## 2024-03-10 NOTE — Telephone Encounter (Signed)
 Pt returned call and was advised that paperwork was faxed.

## 2024-03-15 ENCOUNTER — Other Ambulatory Visit: Payer: Self-pay | Admitting: Family

## 2024-03-15 DIAGNOSIS — R079 Chest pain, unspecified: Secondary | ICD-10-CM

## 2024-03-16 ENCOUNTER — Ambulatory Visit (HOSPITAL_COMMUNITY)
Admission: RE | Admit: 2024-03-16 | Discharge: 2024-03-16 | Disposition: A | Discharge status: Home / Self Care | Source: Ambulatory Visit | Attending: Family Medicine | Admitting: Family Medicine

## 2024-03-16 DIAGNOSIS — I10 Essential (primary) hypertension: Secondary | ICD-10-CM | POA: Diagnosis not present

## 2024-03-16 DIAGNOSIS — J449 Chronic obstructive pulmonary disease, unspecified: Secondary | ICD-10-CM | POA: Diagnosis not present

## 2024-03-16 DIAGNOSIS — R0600 Dyspnea, unspecified: Secondary | ICD-10-CM | POA: Diagnosis not present

## 2024-03-16 DIAGNOSIS — R0789 Other chest pain: Secondary | ICD-10-CM | POA: Diagnosis not present

## 2024-03-16 DIAGNOSIS — R0602 Shortness of breath: Secondary | ICD-10-CM | POA: Diagnosis not present

## 2024-03-16 LAB — ECHOCARDIOGRAM COMPLETE
Area-P 1/2: 3.51 cm2
Calc EF: 66.8 %
S' Lateral: 2.3 cm
Single Plane A2C EF: 66.3 %
Single Plane A4C EF: 66.9 %

## 2024-03-20 ENCOUNTER — Encounter: Payer: Self-pay | Admitting: Podiatry

## 2024-03-20 ENCOUNTER — Ambulatory Visit (INDEPENDENT_AMBULATORY_CARE_PROVIDER_SITE_OTHER): Admitting: Podiatry

## 2024-03-20 ENCOUNTER — Telehealth (INDEPENDENT_AMBULATORY_CARE_PROVIDER_SITE_OTHER): Payer: Self-pay

## 2024-03-20 DIAGNOSIS — L600 Ingrowing nail: Secondary | ICD-10-CM

## 2024-03-20 DIAGNOSIS — M7752 Other enthesopathy of left foot: Secondary | ICD-10-CM

## 2024-03-20 HISTORY — PX: FOOT SURGERY: SHX648

## 2024-03-20 NOTE — Progress Notes (Signed)
 Patient complains of painful ingrown second toe of the left has gotten thick and painful.  Noticed the nail turning black.. Patient denies fevers, chills, nausea, vomiting.  Objective:  Vitals: Reviewed  General: Well developed, nourished, in no acute distress, alert and oriented x3   Vascular: DP pulse 2/4 bilateral. PT pulse 2/4 bilateral.  Mild edema toe with ingrown nail.  Capillary refill time immediate bilaterally  Dermatology: Erythema, edema, necrotic nail, incurvated nail border second toe left with no drainage .  Some dried blood under nail plate.  Tenderness present with palpation. Normal skin tone and texture feet with normal hair growth.  Neurological: Grossly intact. Normal reflexes.   Musculoskeletal: Tenderness with palpation of the distal second toe left.  Hammertoe deformity second toe left.  Hallux valgus deformity left.. No tenderness or painful ROM at IPJ.  Diagnosis: 1.  Capsulitis left foot. 2.  Ingrown nail second toe left  Plan: -New patient office visit for evaluation and management level 3.  Modifier 25. -Discussed hammertoe deformity and some capsulitis around the DIPJ is probably contributing discomfort also.  I think this removing the nail will take care of most of her problems.  Hammertoe may need to be addressed in the future. -discussed etiology and treatment of ingrown nails. Discussed surgical vs conservative treatment. -Consent signed for appropriate matrixectomy affected nail(s).   Procedure(s):   - Matrixectomy(s) second nail left: Toe anesthetized with 3cc 2:1 mixture 2% Lidocaine  with epinephrine : Sodium Bicarbonate. Surgical site prepped. Digital tourniquet applied.  Avulsion of nail plate. performed. Matrixecomy performed with three 30 second applications of phenol to nail matrix. Site irrigated with alcohol.  Tourniquet released with good vascularity noticed in digit.  Applied triple antibiotic to nailbed and applied gauze and Coban dressing. -  Written and oral postoperative instructions given.  -Return for post-op 2 weeks.  JINNY Prentice Binder, DPM

## 2024-03-20 NOTE — Patient Instructions (Signed)

## 2024-03-20 NOTE — Telephone Encounter (Signed)
 This patient is a Ruth Bell patient they are scheduled to see you 04/08/2024 they want a refill on their FAMOTIDINE  20 MG TABLET. Is it okay to call it in or do you want to see them in person first?

## 2024-03-24 ENCOUNTER — Telehealth: Payer: Self-pay

## 2024-03-24 NOTE — Progress Notes (Signed)
 "  NEUROLOGY CONSULTATION NOTE  Ruth Bell MRN: [id number] DOB: 1960-11-28  Referring provider: Harlene Jolly, NP Primary care provider: Dr. Harlene Horton  Reason for consult:  seizure disorder   Thank you for your kind referral of Ruth Bell for consultation of the above symptoms. Although her history is well known to you, please allow me to reiterate it for the purpose of our medical record. She is alone in the office today. Records and images were personally reviewed where available.   Discussed the use of AI scribe software for clinical note transcription with the patient, who gave verbal consent to proceed.  History of Present Illness This is a pleasant 64 year old right-handed woman with a history of hypertension, hyperlipidemia, anxiety, depression, migraines, and functional seizures (psychogenic non-epileptic events), previously seen in our office from 2021 to 2022 for psychogenic seizures. She presents today due to recent shaking episode during hospital admission in October 2025 where Keppra  was started/restarted. Records were reviewed and summarized from 2021 visit. She was previously seen by neurologist Dr. Cleotilde and reported seizures started at age 47. She was previously having them daily, sometimes 3-4 times a day. She reported her heart stopped due to a seizure at one point. Records from Dr. Cleotilde indicate that she had an EEG in 2011 with provocation-induced body and head jerking without EEG correlate. In 2012, episodes of staring and slumping down followed by generalized shaking and gagging were reported and felt to be psychogenic in the ER. In 2014, she was taking Dilantin  and had an EEG capturing shaking with no EEG change. She was switched to Keppra  in 2016. There is an EEG in 2017 with note of mild slowing in the left temporal and left frontal region. Seizure cream was used and she started feeling scared then had generalized shaking that at times could be stopped with  the use of the anti-seizure cream. At that time she was reporting right arm then head jerking. She was event-free for 4 years off Keppra  until a fall in 2020 where she fractured her wrist then seizures recurred and Keppra  restarted in the ER. EEG done in our office in 03/2019 was normal, she had an episode of body shaking with eyes closed, grunting noises, occasional body thrusting movements, increased saliva production, unresponsive to questions with no EEG changes seen. We discussed functional seizures, at that time she and husband both agreed that the seizures were all pain-induced and agreed to stop Keppra  in 2022.    She reports she would tend to have a seizure when she would have a head pain over the vertex. Pain would last 2-3 days then it would be sore and tender after, then she would have a seizure. She was admitted in October 2025 for bronchitis/COPD exacerbation, and while getting vital signs done in the ER had a shaking episode. She had another episode with nursing staff described as shaking and body slightly jerking with eyes close. Lasting approx. 6 minutes. Hospital notes indicate that Keppra  was on her MAR so it was continued at 500mg  BID. She states she has not been taking it and was not on Keppra  prior to October 2025 admission. Since then, she reports only one more seizure that occurred in November 2025, it was in her sleep, she woke up to her husband over her calling her name and telling her she had a seizure. No tongue bite or incontinence, she falls asleep after. She states she was told by her PCP that she needs  to continue taking it everyday.  She recognizes that stress exacerbates her condition, particularly when she was out of work and unable to assist her husband. She has not been able to work since October, she was working at [employer] and reports it was quite stressful.   She describes persistent soreness on the left side of her head, ongoing for about a year. The pain is accompanied  by pressure and tenderness, sometimes causing nausea and vomiting. The pain occurs approximately every three weeks and is exacerbated by stress. She feels a lump on the left side of her neck and pressure behind her left eye. She lays on her right side due to the pain. She cannot take NSAIDs. A hot bath and heating pad helps.   Diagnostic Data: MRI brain without contrast in July 2017 and January 2023 were normal. There have been several EEGs as noted above with shaking episodes captured with no EEG correlate, consistent with psychogenic events.   PAST MEDICAL HISTORY: Past Medical History:  Diagnosis Date   Anemia    Anxiety    Arm skin lesion, left 09/05/2014   Constipation 12/27/2013   Depression    Dysphagia 04/15/2016   Eczema 08/22/2015   GERD (gastroesophageal reflux disease)    Hematuria 03/04/2016   History of shingles 01/22/2016   Hyperglycemia 01/15/2015   Hyperlipidemia, mixed 08/22/2015   Hypertension    Menometrorrhagia 2006   Migraine, unspecified, without mention of intractable migraine without mention of status migrainosus    Preventative health care 11/04/2016   Pseudoseizures    Seizures (HCC)    history of pseudoseizure disorder, last last seizure 3 wks, keppra  inc in dosage   Tubular adenoma of colon 05/2011   Uterine leiomyoma 2006    PAST SURGICAL HISTORY: Past Surgical History:  Procedure Laterality Date   APPENDECTOMY     BILATERAL SALPINGOOPHORECTOMY  04/06/2004   CARDIAC EVENT MONITOR  02/2016   Mostly sinus bradycardia and sinus rhythm.  Rare PVCs and PACs.  No arrhythmias.  Symptoms of chest pain and fluttering as well as passed out spell noted with normal sinus rhythm.   INCONTINENCE SURGERY     MASS EXCISION Left 06/02/2015   Procedure: EXCISION LEFT ARM MASS;  Surgeon: Deward Null III, MD;  Location: [facility];  Service: General;  Laterality: Left;   NM MYOVIEW  LTD  04/2016   Reached heart rate of 127 bpm with Lexiscan .  EF 60  to 65%.  LOW RISK.  No ischemia or infarction.   ROTATOR CUFF REPAIR     TOTAL LAPAROSCOPIC HYSTERECTOMY WITH BILATERAL SALPINGO OOPHORECTOMY  04/06/2004   TRANSTHORACIC ECHOCARDIOGRAM  01/2016   F 55-6%.  No R WMA.  GR 1 DD.  Normal valves.    MEDICATIONS: Medications Ordered Prior to Encounter[1]  ALLERGIES: Allergies[2]  FAMILY HISTORY: Family History  Problem Relation Age of Onset   Heart disease Mother    Heart attack Mother    Cancer Father        colon   Hypertension Father    Brain cancer Sister    Hypertension Sister    Cancer Sister        unsure of type   Diabetes Sister        type 2   Heart attack Sister    Cancer Brother        unknown type   Hypertension Brother    Colon cancer Neg Hx    Esophageal cancer Neg Hx    Pancreatic  cancer Neg Hx    Stomach cancer Neg Hx    Colon polyps Neg Hx    Bladder Cancer Neg Hx    Renal cancer Neg Hx     SOCIAL HISTORY: Social History   Socioeconomic History   Marital status: Married    Spouse name: Elsie   Number of children: 2   Years of education: Not on file   Highest education level: Not on file  Occupational History   Occupation: CMA-retired  Tobacco Use   Smoking status: Every Day    Current packs/day: 0.50    Average packs/day: 0.5 packs/day for 40.5 years (20.2 ttl pk-yrs)    Types: Cigarettes    Start date: 12/18/2015   Smokeless tobacco: Never   Tobacco comments:    Started smoking at age 70. 3-4 cigarettes daily 02/25/2024  Vaping Use   Vaping status: Never Used  Substance and Sexual Activity   Alcohol use: Yes    Comment: wine occassioanlly    Drug use: No   Sexual activity: Not on file    Comment: lives with husband, no dietary restrictions.   Other Topics Concern   Not on file  Social History Narrative   Lives with her husband and their son.   Daughter lives in [place], [place].   Right handed   Social Drivers of Health   Tobacco Use: High Risk (03/20/2024)   Patient History     Smoking Tobacco Use: Every Day    Smokeless Tobacco Use: Never    Passive Exposure: Not on file  Financial Resource Strain: Not on file  Food Insecurity: No Food Insecurity (02/24/2024)   Epic    Worried About Programme Researcher, Broadcasting/film/video in the Last Year: Never true    Ran Out of Food in the Last Year: Never true  Transportation Needs: No Transportation Needs (02/24/2024)   Epic    Lack of Transportation (Medical): No    Lack of Transportation (Non-Medical): No  Physical Activity: Not on file  Stress: Not on file  Social Connections: Moderately Integrated (01/07/2024)   Social Connection and Isolation Panel    Frequency of Communication with Friends and Family: More than three times a week    Frequency of Social Gatherings with Friends and Family: More than three times a week    Attends Religious Services: More than 4 times per year    Active Member of [employer] or Organizations: No    Attends Banker Meetings: Never    Marital Status: Married  Catering Manager Violence: Not At Risk (02/24/2024)   Epic    Fear of Current or Ex-Partner: No    Emotionally Abused: No    Physically Abused: No    Sexually Abused: No  Depression (PHQ2-9): High Risk (02/24/2024)   Depression (PHQ2-9)    PHQ-2 Score: 19  Alcohol Screen: Not on file  Housing: Low Risk (02/24/2024)   Epic    Unable to Pay for Housing in the Last Year: No    Number of Times Moved in the Last Year: 0    Homeless in the Last Year: No  Utilities: Not At Risk (02/24/2024)   Epic    Threatened with loss of utilities: No  Health Literacy: Not on file     PHYSICAL EXAM: Vitals:   03/25/24 0832 03/25/24 0833  BP: (!) 150/91 (!) 143/82  Pulse:    SpO2:     General: No acute distress Head:  Normocephalic/atraumatic, +tenderness on left temporal, occipital, and left  upper shoulder Skin/Extremities: No rash, no edema Neurological Exam: Mental status: alert and awake, no dysarthria or aphasia, Fund of knowledge is  appropriate.  Recent and remote memory are intact.  Attention and concentration are normal.   Cranial nerves: CN I: not tested CN II: pupils equal, round, visual fields intact CN III, IV, VI:  full range of motion, no nystagmus, no ptosis CN V: facial sensation intact CN VII: upper and lower face symmetric CN VIII: hearing intact to conversation Bulk & Tone: normal, no fasciculations. Motor: 5/5 throughout with no pronator drift. Sensation: intact to light touch, cold, pin, vibration  sense.  No extinction to double simultaneous stimulation.  Romberg test negative Deep Tendon Reflexes: +1 throughout Cerebellar: no incoordination on finger to nose testing Gait: narrow-based and steady, no ataxia Tremor: none   IMPRESSION: This is a pleasant 64 year old right-handed woman with a history of hypertension, hyperlipidemia, anxiety, depression, migraines, and functional seizures (psychogenic non-epileptic events), presenting after episodes of shaking during October 2025 admission. There was question about Keppra  intake since it was on her [facility], however she states she was not taking it prior to hospitalization, this was discontinued in 2022. She is back on Keppra  500mg  BID and reports one nocturnal seizure in November 2025 but none since. She has had several EEGs which have captured shaking episodes with normal EEG. She has an understanding of functional (non-epileptic) seizures and recognizes how stress is contributory. We discussed repeating 1-hour EEG. We can continue Keppra  500mg  BID for now, however if shaking episodes recur/increase, recommend prolonged EEG monitoring to help guide long-term medication management. She is reporting left-sided temporal headaches, likely cervicogenic but will check ESR. She was given a prescription for prn Flexeril  5mg  at bedtime, side effects discussed. She does not drive. Follow-up in 4 months, call for any changes.    Thank you for allowing me to participate in the  care of this patient. Please do not hesitate to call for any questions or concerns.   Darice Shivers, M.D.  CC: Dr. Domenica, Harlene Jolly, NP     [1]  Current Outpatient Medications on File Prior to Visit  Medication Sig Dispense Refill   acetaminophen  (TYLENOL ) 500 MG tablet Take 1,000 mg by mouth every 6 (six) hours as needed for mild pain (pain score 1-3).     albuterol  (VENTOLIN  HFA) 108 (90 Base) MCG/ACT inhaler Inhale 1-2 puffs into the lungs every 6 (six) hours as needed for wheezing or shortness of breath. 6.7 g 6   amLODipine  (NORVASC ) 5 MG tablet Take 1 tablet (5 mg total) by mouth daily. 90 tablet 0   ascorbic acid (VITAMIN C) 100 MG tablet Take 1 tablet by mouth daily. (Patient taking differently: Take 1,000 mg by mouth every other day.)     budesonide -glycopyrrolate -formoterol  (BREZTRI  AEROSPHERE) 160-9-4.8 MCG/ACT AERO inhaler Inhale 2 puffs into the lungs in the morning and at bedtime. 1 each 6   budesonide -glycopyrrolate -formoterol  (BREZTRI  AEROSPHERE) 160-9-4.8 MCG/ACT AERO inhaler Inhale 2 puffs into the lungs in the morning and at bedtime.     desloratadine  (CLARINEX ) 5 MG tablet Take 1 tablet (5 mg total) by mouth daily. 90 tablet 0   estradiol  (ESTRACE ) 0.01 % CREA vaginal cream Place 0.5g nightly for two weeks then twice a week after 42.5 g 3   famotidine  (PEPCID ) 20 MG tablet TAKE 1 TABLET BY MOUTH TWICE A DAY 180 tablet 1   fluticasone  (FLONASE ) 50 MCG/ACT nasal spray Place 2 sprays into both nostrils daily. 16  g 6   guaiFENesin  (MUCINEX ) 600 MG 12 hr tablet Take 1 tablet (600 mg total) by mouth 2 (two) times daily. 60 tablet 0   guaiFENesin -codeine  100-10 MG/5ML syrup Take 5 mLs by mouth every 6 (six) hours as needed for cough. 120 mL 0   HYDROcodone  bit-homatropine (HYDROMET) 5-1.5 MG/5ML syrup Take 5 mLs by mouth every 6 (six) hours as needed for cough. 180 mL 0   hydrOXYzine  (ATARAX ) 10 MG tablet Take 1 tablet (10 mg total) by mouth every 8 (eight) hours as needed  for anxiety. 30 tablet 0   hyoscyamine  (ANASPAZ ) 0.125 MG TBDP disintergrating tablet Place 1 tablet (0.125 mg total) under the tongue every 4 (four) hours as needed. 30 tablet 1   levETIRAcetam  (KEPPRA ) 500 MG tablet Take 1 tablet (500 mg total) by mouth 2 (two) times daily. 180 tablet 1   meloxicam  (MOBIC ) 7.5 MG tablet TAKE 1 TABLET BY MOUTH EVERY DAY 30 tablet 0   methylPREDNISolone  (MEDROL ) 4 MG tablet 5 tabs po x 3 day then 4 tabs po x 3 day then 3 tabs po x 3 day then 2 tabs po x 3 day then 1 tab po x 3 day and stop 45 tablet 0   metoprolol  succinate (TOPROL -XL) 100 MG 24 hr tablet Take 1 tablet (100 mg total) by mouth 2 (two) times daily. Take with or immediately following a meal. 180 tablet 0   ondansetron  (ZOFRAN ) 4 MG tablet Take 1 tablet (4 mg total) by mouth every 8 (eight) hours as needed for nausea or vomiting. 30 tablet 0   OVER THE COUNTER MEDICATION MediKisk Lung Cleansng Spray - spray into mouth as needed. Contains - eucalyptus, peppermint, , licorice root, honeysuckle and calendula.     trospium  (SANCTURA ) 20 MG tablet Take 1 tablet (20 mg total) by mouth 2 (two) times daily. 60 tablet 2   No current facility-administered medications on file prior to visit.  [2] No Known Allergies  "

## 2024-03-25 ENCOUNTER — Other Ambulatory Visit

## 2024-03-25 ENCOUNTER — Ambulatory Visit: Admitting: Neurology

## 2024-03-25 ENCOUNTER — Other Ambulatory Visit: Payer: Self-pay

## 2024-03-25 ENCOUNTER — Encounter: Payer: Self-pay | Admitting: Neurology

## 2024-03-25 VITALS — BP 143/82 | HR 81 | Ht 62.0 in | Wt 113.2 lb

## 2024-03-25 DIAGNOSIS — G4486 Cervicogenic headache: Secondary | ICD-10-CM | POA: Diagnosis not present

## 2024-03-25 DIAGNOSIS — F445 Conversion disorder with seizures or convulsions: Secondary | ICD-10-CM

## 2024-03-25 DIAGNOSIS — G40909 Epilepsy, unspecified, not intractable, without status epilepticus: Secondary | ICD-10-CM

## 2024-03-25 LAB — SEDIMENTATION RATE: Sed Rate: 9 mm/h (ref 0–30)

## 2024-03-25 MED ORDER — CYCLOBENZAPRINE HCL 5 MG PO TABS: 5.0000 mg | ORAL_TABLET | Freq: Three times a day (TID) | ORAL | 11 refills | Status: AC | PRN

## 2024-03-25 MED ORDER — LEVETIRACETAM 500 MG PO TABS: 500.0000 mg | ORAL_TABLET | Freq: Two times a day (BID) | ORAL | 3 refills | Status: AC

## 2024-03-25 NOTE — Patient Instructions (Signed)
 Visit Information  Thank you for taking time to visit with me today. Please don't hesitate to contact me if I can be of assistance to you before our next scheduled appointment.  Your next care management appointment is by telephone on 04/22/24 at 1:30 PM  Please call the care guide team at [phone] if you need to cancel, schedule, or reschedule an appointment.   Please call the Suicide and Crisis Lifeline: 988 call 1-800-273-TALK (toll free, 24 hour hotline) if you are experiencing a Mental Health or Behavioral Health Crisis or need someone to talk to.  Rosaline Finlay, RN MSN [place]  [facility] Population Health RN Care Manager Direct Dial: [phone]  Fax: [phone]   Following is a copy of your care plan:   Goals Addressed             This Visit's Progress    [facility] RN Care Plan   On track    Problems:  Chronic Disease Management support and education needs related to COPD and migraines  Goal: Over the next 30 days the Patient will demonstrate a decrease COPD in exacerbations as evidenced by patient report of stable respiratory symptoms, compliance with ordered inhalers demonstrate Improved adherence to prescribed treatment plan for migraines as evidenced by patient report of medication compliance and decrease in migraine occurrence, patient rating pain <8/10 Confirm dental coverage for 2026 as evidenced by patient report  Interventions:   COPD Interventions: Advised patient to self assesses COPD action plan zone and make appointment with provider if in the yellow zone for 48 hours without improvement Discussed the importance of adequate rest and management of fatigue with COPD Provided education about and advised patient to utilize infection prevention strategies to reduce risk of respiratory infection Provided instruction about proper use of medications used for management of COPD including inhalers Provided patient with basic written and verbal COPD education on  self care/management/and exacerbation prevention Discussed red flag symptoms and advised patient to contact pulmonology office for continued symptoms/increased use of Albuterol    Evaluation of current treatment plan related to migraine self-management and patient's adherence to plan as established by provider. Discussed plans with patient for ongoing care management follow up and provided patient with direct contact information for care management team Advised patient to contact insurance agent to ensure dental coverage has been selected for 2026 3-way call placed to behavioral health office where referral was sent. Patient scheduled with therapist Discussed neurology visit this morning and advised patient to pick up medications once notified by her pharmacy that they are ready  Patient Self-Care Activities:  Attend all scheduled provider appointments Attend church or other social activities Call provider office for new concerns or questions  Perform all self care activities independently  Take medications as prescribed   limit outdoor activity during cold weather do breathing exercises every day eliminate symptom triggers at home follow rescue plan if symptoms flare-up  Plan:  Telephone follow up appointment with care management team member scheduled for:  04/22/24 at 1:30 PM              Patient verbalized understanding of Care plan and visit instructions communicated this visit

## 2024-03-25 NOTE — Patient Outreach (Signed)
 Complex Care Management   Visit Note  03/25/2024  Name:  Ruth Bell MRN: [id number] DOB: 08-Sep-1960  Situation: Referral received for Complex Care Management related to COPD and migraines I obtained verbal consent from Patient.  Visit completed with Patient  on the phone  Background:   Past Medical History:  Diagnosis Date   Anemia    Anxiety    Arm skin lesion, left 09/05/2014   Constipation 12/27/2013   Depression    Dysphagia 04/15/2016   Eczema 08/22/2015   GERD (gastroesophageal reflux disease)    Hematuria 03/04/2016   History of shingles 01/22/2016   Hyperglycemia 01/15/2015   Hyperlipidemia, mixed 08/22/2015   Hypertension    Menometrorrhagia 2006   Migraine, unspecified, without mention of intractable migraine without mention of status migrainosus    Preventative health care 11/04/2016   Pseudoseizures    Seizures (HCC)    history of pseudoseizure disorder, last last seizure 3 wks, keppra  inc in dosage   Tubular adenoma of colon 05/2011   Uterine leiomyoma 2006    Assessment: Patient Reported Symptoms:  Cognitive Cognitive Status: Able to follow simple commands, Alert and oriented to person, place, and time, Normal speech and language skills Cognitive/Intellectual Conditions Management [RPT]: None reported or documented in medical history or problem list   Health Maintenance Behaviors: Annual physical exam, Stress management Health Facilitated by: Prayer/meditation, Stress management, Rest  Neurological Neurological Review of Symptoms: Headaches Oher Neurological Symptoms/Conditions [RPT]: Patient reports continued headaches, which she discussed with neurology at appointment today Neurological Management Strategies: Coping strategies, Medication therapy, Routine screening Neurological Comment: Note per chart review patient had follow-up with neurology this morning. Patient reports she is scheduled for 1-hour EEG tomorrow morning. Patient reports she has been  prescribed Flexeril  and a steroid, and is waiting on the pharmacy to have medication ready  HEENT HEENT Symptoms Reported: Other: HEENT Management Strategies: Coping strategies, Routine screening HEENT Comment: Patient reports she has forgotten to contact insurance agent to verify dental coverage into 2026. Advised patient that per insurance card scanned into system today, it says with dental    Cardiovascular Cardiovascular Symptoms Reported: Swelling in legs or feet (Ankle swelling L>R, at baseline per patient) Does patient have uncontrolled Hypertension?: No Cardiovascular Management Strategies: Coping strategies, Medication therapy, Routine screening Cardiovascular Comment: Note per chart review patient has completed ECHO as ordered by cardiology  Respiratory Respiratory Symptoms Reported: Productive cough, Shortness of breath Other Respiratory Symptoms: Patient reports continued cough with brown colored mucus and shortness of breath. Last PRN Albuterol  use today. Patient reports she is using PRN inhaler more often recently. She denies sick contacts. Advised patient to reach out to her pulmonologist for continued symptoms Respiratory Management Strategies: Adequate rest, Breathing exercise, Coping strategies, Medication therapy, Routine screening  Endocrine Endocrine Symptoms Reported: Not assessed    Gastrointestinal Gastrointestinal Symptoms Reported: Not assessed      Genitourinary Genitourinary Symptoms Reported: Not assessed    Integumentary Integumentary Symptoms Reported: No symptoms reported Skin Comment: Note per chart review patient did attend podiatry visit  Musculoskeletal Musculoskelatal Symptoms Reviewed: Weakness Musculoskeletal Management Strategies: Adequate rest, Coping strategies, Routine screening Musculoskeletal Comment: Patient denies falls since previous CMRN visit Falls in the past year?: No Number of falls in past year: 1 or less Was there an injury with  Fall?: No Fall Risk Category Calculator: 0 Patient Fall Risk Level: Low Fall Risk Patient at Risk for Falls Due to: History of fall(s), Other (Comment) (History of seizures) Fall risk Follow  up: Falls evaluation completed, Education provided, Falls prevention discussed  Psychosocial Psychosocial Symptoms Reported: Not assessed Additional Psychological Details: Patient reports she still has not heard from office regarding psych referral placed 02/20/24. 3-way call placed to [facility] per referral letter. Patient scheduled with therapist 04/15/24     Quality of Family Relationships: helpful, involved, supportive Do you feel physically threatened by others?: No    03/25/2024    PHQ2-9 Depression Screening   Little interest or pleasure in doing things    Feeling down, depressed, or hopeless    PHQ-2 - Total Score    Trouble falling or staying asleep, or sleeping too much    Feeling tired or having little energy    Poor appetite or overeating     Feeling bad about yourself - or that you are a failure or have let yourself or your family down    Trouble concentrating on things, such as reading the newspaper or watching television    Moving or speaking so slowly that other people could have noticed.  Or the opposite - being so fidgety or restless that you have been moving around a lot more than usual    Thoughts that you would be better off dead, or hurting yourself in some way    PHQ2-9 Total Score    If you checked off any problems, how difficult have these problems made it for you to do your work, take care of things at home, or get along with other people    Depression Interventions/Treatment      There were no vitals filed for this visit. Pain Scale: 0-10 Pain Score: 8  Pain Type: Acute pain Pain Location: Head Pain Orientation: Other (Comment) (top)  Medications Reviewed Today     Reviewed by Arno Rosaline SQUIBB, RN (Registered Nurse) on 03/25/24 at  1409  Med List Status: <None>   Medication Order Taking? Sig Documenting Provider Last Dose Status Informant  acetaminophen  (TYLENOL ) 500 MG tablet [id number] Yes Take 1,000 mg by mouth every 6 (six) hours as needed for mild pain (pain score 1-3). [provider]  Active Self, Pharmacy Records  albuterol  (VENTOLIN  HFA) [phone] Base) MCG/ACT inhaler [id number] Yes Inhale 1-2 puffs into the lungs every 6 (six) hours as needed for wheezing or shortness of breath. Adrien Guan, Tamala, MD  Active   amLODipine  (NORVASC ) 5 MG tablet [id number] Yes Take 1 tablet (5 mg total) by mouth daily. Domenica Harlene LABOR, MD  Active   ascorbic acid (VITAMIN C) 100 MG tablet [id number] Yes Take 1 tablet by mouth daily.  Patient taking differently: Take 1,000 mg by mouth every other day.   [provider]  Active Self, Pharmacy Records  budesonide -glycopyrrolate -formoterol  (BREZTRI  AEROSPHERE) 160-9-4.8 MCG/ACT AERO inhaler [id number] Yes Inhale 2 puffs into the lungs in the morning and at bedtime. Adrien Guan, Tamala, MD  Active   budesonide -glycopyrrolate -formoterol  (BREZTRI  AEROSPHERE) 160-9-4.8 MCG/ACT AERO inhaler [id number] Yes Inhale 2 puffs into the lungs in the morning and at bedtime. Adrien Guan, Tamala, MD  Active   cyclobenzaprine  (FLEXERIL ) 5 MG tablet [id number]  Take 1 tablet (5 mg total) by mouth every 8 (eight) hours as needed for muscle spasms. Georjean Darice HERO, MD  Active   desloratadine  (CLARINEX ) 5 MG tablet [id number] Yes Take 1 tablet (5 mg total) by mouth daily. Domenica Harlene LABOR, MD  Active   estradiol  (ESTRACE ) 0.01 % CREA vaginal cream [id number] Yes Place 0.5g nightly for two weeks then twice a  week after Guadlupe Lianne DASEN, MD  Active   famotidine  (PEPCID ) 20 MG tablet [id number] Yes TAKE 1 TABLET BY MOUTH TWICE A DAY Soldatova, Liuba, MD  Active Self, Pharmacy Records  fluticasone  (FLONASE ) 50 MCG/ACT nasal spray [id number] Yes Place 2 sprays into both nostrils daily. Rai, Nydia POUR,  MD  Active   guaiFENesin  (MUCINEX ) 600 MG 12 hr tablet [id number] Yes Take 1 tablet (600 mg total) by mouth 2 (two) times daily. Rai, Nydia POUR, MD  Active   guaiFENesin -codeine  100-10 MG/5ML syrup [id number] Yes Take 5 mLs by mouth every 6 (six) hours as needed for cough. Rai, Nydia POUR, MD  Active   HYDROcodone  bit-homatropine (HYDROMET) 5-1.5 MG/5ML syrup [id number] Yes Take 5 mLs by mouth every 6 (six) hours as needed for cough. Domenica Harlene LABOR, MD  Active   hydrOXYzine  (ATARAX ) 10 MG tablet [id number] Yes Take 1 tablet (10 mg total) by mouth every 8 (eight) hours as needed for anxiety. Rai, Nydia POUR, MD  Active   hyoscyamine  (ANASPAZ ) 0.125 MG TBDP disintergrating tablet [id number] Yes Place 1 tablet (0.125 mg total) under the tongue every 4 (four) hours as needed. Domenica Harlene LABOR, MD  Active Self, Pharmacy Records  levETIRAcetam  (KEPPRA ) 500 MG tablet [id number]  Take 1 tablet (500 mg total) by mouth 2 (two) times daily. Georjean Darice HERO, MD  Active   meloxicam  (MOBIC ) 7.5 MG tablet [id number] Yes TAKE 1 TABLET BY MOUTH EVERY DAY Webb, Padonda B, FNP  Active   methylPREDNISolone  (MEDROL ) 4 MG tablet [id number] Yes 5 tabs po x 3 day then 4 tabs po x 3 day then 3 tabs po x 3 day then 2 tabs po x 3 day then 1 tab po x 3 day and stop Domenica Harlene LABOR, MD  Active   metoprolol  succinate (TOPROL -XL) 100 MG 24 hr tablet [id number] Yes Take 1 tablet (100 mg total) by mouth 2 (two) times daily. Take with or immediately following a meal. Domenica Harlene LABOR, MD  Active   ondansetron  (ZOFRAN ) 4 MG tablet [id number] Yes Take 1 tablet (4 mg total) by mouth every 8 (eight) hours as needed for nausea or vomiting. Davia Nydia POUR, MD  Active   OVER THE COUNTER MEDICATION [id number] Yes MediKisk Lung Cleansng Spray - spray into mouth as needed. Contains - eucalyptus, peppermint, , licorice root, honeysuckle and calendula. [provider]  Active   trospium  (SANCTURA ) 20 MG tablet [id number] Yes Take 1 tablet (20 mg  total) by mouth 2 (two) times daily. Guadlupe Lianne DASEN, MD  Active             Recommendation:   Continue Current Plan of Care  Follow Up Plan:   Telephone follow up appointment date/time:  04/22/24 at 1:30 PM  Rosaline Finlay, RN MSN [place]  [facility] RN Care Manager Direct Dial: [phone]  Fax: [phone]

## 2024-03-25 NOTE — Patient Instructions (Addendum)
 Good to see you again. Wishing you all the best.  Schedule 1-hour EEG  2. Have bloodwork done for ESR  3. Continue Keppra  500mg  twice a day for now  4. A prescription was sent for Flexeril  5mg  as needed for neck/head pain. You can take it every 8 hours as needed  5. Follow-up in 4 months, call for any changes

## 2024-03-26 ENCOUNTER — Ambulatory Visit (INDEPENDENT_AMBULATORY_CARE_PROVIDER_SITE_OTHER): Payer: Self-pay | Admitting: Neurology

## 2024-03-26 ENCOUNTER — Ambulatory Visit: Payer: Self-pay | Admitting: Neurology

## 2024-03-26 DIAGNOSIS — F445 Conversion disorder with seizures or convulsions: Secondary | ICD-10-CM | POA: Diagnosis not present

## 2024-03-26 DIAGNOSIS — G4486 Cervicogenic headache: Secondary | ICD-10-CM

## 2024-03-26 DIAGNOSIS — G40909 Epilepsy, unspecified, not intractable, without status epilepticus: Secondary | ICD-10-CM

## 2024-03-26 NOTE — Procedures (Signed)
 ELECTROENCEPHALOGRAM REPORT  Date of Study: 03/26/2024  Patient's Name: Ruth Bell MRN: [id number] Date of Birth: [birth year]  Referring Provider: Dr. Darice Shivers  Clinical History: This is a 64 year old woman with a history of functional seizures, recent shaking episode in October and nocturnal shaking episode in November. EEG for classification.  CNS Active Medications: Keppra   Technical Summary: A multichannel digital 1-hour EEG recording measured by the international 10-20 system with electrodes applied with paste and impedances below 5000 ohms performed in our laboratory with EKG monitoring in an awake and asleep patient.  Hyperventilation was not performed. Photic stimulation was performed.  The digital EEG was referentially recorded, reformatted, and digitally filtered in a variety of bipolar and referential montages for optimal display.    Description: The patient is awake and asleep during the recording.  During maximal wakefulness, there is a symmetric, medium voltage 9 Hz posterior dominant rhythm that attenuates with eye opening.  The record is symmetric.  During drowsiness and sleep, there is an increase in theta slowing of the background with shifting asymmetry over the bilateral temporal regions, left greater than right.  Vertex waves and symmetric sleep spindles were seen. Photic stimulation did not elicit any abnormalities.  There were no epileptiform discharges or electrographic seizures seen.    EKG lead was unremarkable.  Impression: This 1-hour awake and asleep EEG is within normal limits.  Clinical Correlation: A normal EEG does not exclude a clinical diagnosis of epilepsy.  If further clinical questions remain, prolonged EEG may be helpful.  Clinical correlation is advised.   Darice Shivers, M.D.

## 2024-03-27 NOTE — Telephone Encounter (Signed)
 Pt called an informed that bloodwork normal, no inflammation seen,

## 2024-03-27 NOTE — Telephone Encounter (Signed)
-----   Message from Darice Shivers, MD sent at 03/26/2024  6:01 AM EST ----- Pls let her know bloodwork normal, no inflammation seen, thanks

## 2024-04-06 NOTE — Therapy (Unsigned)
 " OUTPATIENT PHYSICAL THERAPY FEMALE PELVIC EVALUATION   Patient Name: Ruth Bell MRN: [id number] DOB:1960/05/23, 64 y.o., female Today's Date: 04/06/2024  END OF SESSION:   Past Medical History:  Diagnosis Date   Anemia    Anxiety    Arm skin lesion, left 09/05/2014   Constipation 12/27/2013   Depression    Dysphagia 04/15/2016   Eczema 08/22/2015   GERD (gastroesophageal reflux disease)    Hematuria 03/04/2016   History of shingles 01/22/2016   Hyperglycemia 01/15/2015   Hyperlipidemia, mixed 08/22/2015   Hypertension    Menometrorrhagia 2006   Migraine, unspecified, without mention of intractable migraine without mention of status migrainosus    Preventative health care 11/04/2016   Pseudoseizures    Seizures (HCC)    history of pseudoseizure disorder, last last seizure 3 wks, keppra  inc in dosage   Tubular adenoma of colon 05/2011   Uterine leiomyoma 2006   Past Surgical History:  Procedure Laterality Date   APPENDECTOMY     BILATERAL SALPINGOOPHORECTOMY  04/06/2004   CARDIAC EVENT MONITOR  02/2016   Mostly sinus bradycardia and sinus rhythm.  Rare PVCs and PACs.  No arrhythmias.  Symptoms of chest pain and fluttering as well as passed out spell noted with normal sinus rhythm.   FOOT SURGERY Left 03/20/2024   INCONTINENCE SURGERY     MASS EXCISION Left 06/02/2015   Procedure: EXCISION LEFT ARM MASS;  Surgeon: Deward Null III, MD;  Location: [facility];  Service: General;  Laterality: Left;   NM MYOVIEW  LTD  04/2016   Reached heart rate of 127 bpm with Lexiscan .  EF 60 to 65%.  LOW RISK.  No ischemia or infarction.   ROTATOR CUFF REPAIR     TOTAL LAPAROSCOPIC HYSTERECTOMY WITH BILATERAL SALPINGO OOPHORECTOMY  04/06/2004   TRANSTHORACIC ECHOCARDIOGRAM  01/2016   F 55-6%.  No R WMA.  GR 1 DD.  Normal valves.   Patient Active Problem List   Diagnosis Date Noted   Chest pain 02/20/2024   Chronic obstructive pulmonary disease (HCC) 02/20/2024    Mood disorder 02/20/2024   Urinary incontinence, mixed 01/22/2024   Nocturia 01/22/2024   Vaginal atrophy 01/22/2024   Pelvic pain 01/22/2024   Abnormal urinalysis 01/22/2024   Tobacco dependence 01/21/2024   Underweight (BMI < 18.5) 01/21/2024   Depression 01/10/2024   COPD with acute exacerbation (HCC) 01/06/2024   Aortic atherosclerosis 11/13/2022   Left-sided weakness 01/31/2021   Gout 01/31/2021   Urinary frequency 05/07/2017   Abdominal bloating 05/07/2017   Back pain 03/21/2017   Tachycardia 02/28/2017   Preventative health care 11/04/2016   Hoarseness 04/15/2016   Dysphagia 04/15/2016   Atypical chest pain 04/13/2016   Dysuria 03/04/2016   Hematuria 03/04/2016   Palpitations 01/30/2016   History of shingles 01/22/2016   Eczema 08/22/2015   Hyperlipidemia, mixed 08/22/2015   Hyperglycemia 01/15/2015   Lumbar radiculopathy 11/19/2014   GERD (gastroesophageal reflux disease) 09/05/2014   Noncompliance with medications 08/11/2014   Constipation 12/27/2013   Pain in joint, shoulder region 10/13/2013   Shoulder pain 09/23/2013   Numbness and tingling in right hand 09/23/2013   Jaw pain 03/26/2013   Neck pain 05/17/2012   Seizure disorder (HCC) 02/21/2012   Non compliance w medication regimen 02/21/2012   Essential hypertension 09/06/2011   Abdominal pain 06/20/2011   Hemorrhoids 06/20/2011   Hx of adenomatous colonic polyps 06/13/2011   Tubular adenoma of colon 05/2011   Musculoskeletal chest pain 04/23/2011   Radiculopathy of  leg 02/02/2011   History of psychogenic nonepileptic seizure 07/10/2010   Anxiety and depression 12/29/2009   TINEA PEDIS 06/02/2009   Exertional dyspnea 01/13/2008   Microscopic hematuria 06/24/2007   WEIGHT LOSS 04/28/2007   Anemia 03/10/2007   Migraine 03/10/2007   ARTHRITIS 03/10/2007   SYNCOPE 03/10/2007   Menometrorrhagia 2006    PCP: Domenica Harlene LABOR, MD  REFERRING PROVIDER: Guadlupe Lianne DASEN, MD   REFERRING DIAG:  K59.09  (ICD-10-CM) - Other constipation  N39.46 (ICD-10-CM) - Urinary incontinence, mixed  R10.20 (ICD-10-CM) - Pelvic pain    THERAPY DIAG:  No diagnosis found.  Rationale for Evaluation and Treatment: Rehabilitation  ONSET DATE: ***  SUBJECTIVE:                                                                                                                                                                                           SUBJECTIVE STATEMENT: 63yo mixed urinary incontinence, constipation, pelvic floor myofascial pain.  Fluid intake: 48oz water per day, 8oz coffee, 16oz tea, 8oz orange juice, 8oz apple juice  FUNCTIONAL LIMITATIONS: ***  PERTINENT HISTORY:  Medications for current condition: vaginal estrogen Surgeries: Appendectomy; Bilateral salping oophorectomy; Total laparoscopic hysterectomy with bilateral salpingo oophorectomy 2006;  Other: Uterine leiomyoma; Seizures;  Sexual abuse: {Yes/No:304960894}  PAIN:  Are you having pain? {yes/no:20286} NPRS scale: ***/10 Pain location: {pelvic pain location:27098}  Pain type: {type:313116} Pain description: {PAIN DESCRIPTION:21022940}   Aggravating factors: *** Relieving factors: ***  PRECAUTIONS: None  RED FLAGS: {PT Red Flags:29287}   WEIGHT BEARING RESTRICTIONS: No  FALLS:  Has patient fallen in last 6 months? {fallsyesno:27318}  OCCUPATION: ***  ACTIVITY LEVEL : ***  PLOF: {PLOF:24004}  PATIENT GOALS: ***   BOWEL MOVEMENT: Pain with bowel movement: {yes/no:20286} Type of bowel movement:Type (Bristol Stool Scale) Type 1, Frequency 1 time per day, Strain yes, and Splinting yes Fully empty rectum: No Leakage: No                                                  Caused by: *** Bowel urgency: *** Pads: {Yes/No:304960894} Fiber supplement/laxative stool softener, tried prune juice in the past   URINATION: Pain with urination: {yes/no:20286} Fully empty bladder: No***                                          Post-void dribble: Yes  Stream: {PT urination:27102}  Urgency: {YES/NO AS:20300} Frequency:during the day ***                                                        Nocturia: Yes: 2-3 times***   Leakage: Coughing and Sneezing; 2-3 leaks/night Leaks 1-2 time(s) per days ;  Pads/briefs: Yes: 5 pads per day  INTERCOURSE:not active  Ability to have vaginal penetration {YES/NO:21197} Pain with intercourse: {pain with intercourse PA:27099} Dryness: {YES/NO AS:20300} Climax: *** Marinoff Scale: ***/3 Lubricant:  PREGNANCY: Vaginal deliveries *** Tearing {Yes***/No:304960894} Episiotomy {YES/NO AS:20300} C-section deliveries *** Currently pregnant {Yes***/No:304960894}  PROLAPSE: {PT prolapse:27101}   OBJECTIVE:  Note: Objective measures were completed at Evaluation unless otherwise noted.  DIAGNOSTIC FINDINGS:  Post-void residual:3 ml Voiding Cystourethrogram (VCUG):  Ultrasound: Pelvic Floor: Levator muscle strength is 5/5.    Levator ani is tender (R > L) and obturator internus is tender (R > L).   PATIENT SURVEYS:  {rehab surveys:24030}  PFIQ-7: *** UIQ-7 *** CRAIG -7 *** POPIQ-7 *** Female Sexual Function Index (FSFI) Questionnaire ***  COGNITION: Overall cognitive status: {cognition:24006}     SENSATION: Light touch: {intact/deficits:24005}  LUMBAR SPECIAL TESTS:  {lumbar special test:25242}  FUNCTIONAL TESTS:  {Functional tests:24029} Single leg stance:  Rt:  Lt: Sit-up test: Squat: Bed mobility:  GAIT: Assistive device utilized: {Assistive devices:23999} Comments: ***  POSTURE: {posture:25561}   LUMBARAROM/PROM:  A/PROM A/PROM  Eval (% available)  Flexion   Extension   Right lateral flexion   Left lateral flexion   Right rotation   Left rotation    (Blank rows = not tested)  LOWER EXTREMITY ROM:  {AROM/PROM:27142} ROM Right eval Left eval  Hip flexion    Hip extension    Hip abduction    Hip adduction    Hip internal  rotation    Hip external rotation    Knee flexion    Knee extension    Ankle dorsiflexion    Ankle plantarflexion    Ankle inversion    Ankle eversion     (Blank rows = not tested)  LOWER EXTREMITY MMT:  MMT Right eval Left eval  Hip flexion    Hip extension    Hip abduction    Hip adduction    Hip internal rotation    Hip external rotation    Knee flexion    Knee extension    Ankle dorsiflexion    Ankle plantarflexion    Ankle inversion    Ankle eversion     (Blank rows = not tested) PALPATION:  General: ***  Pelvic Alignment: ***  Abdominal: ***  Diastasis: {Yes/No:304960894}*** Distortion: {YES/NO AS:20300}  Breathing: *** Scar tissue: {Yes/No:304960894}*** Active Straight Leg Raise: ***                External Perineal Exam: ***                             Internal Pelvic Floor: ***  Patient confirms identification and approves PT to assess internal pelvic floor and treatment {yes/no:20286} All internal or external pelvic floor assessments and/or treatments are completed with proper hand hygiene and gloves hands. If needed gloves are changed with hand hygiene during patient care time.  PELVIC MMT:   MMT eval  Vaginal   Internal Anal Sphincter  External Anal Sphincter   Puborectalis   (Blank rows = not tested)        TONE: ***  PROLAPSE: ***  TODAY'S TREATMENT:                                                                                                                              DATE: ***  EVAL ***   PATIENT EDUCATION:  Education details: *** Person educated: {Person educated:25204} Education method: {Education Method:25205} Education comprehension: {Education Comprehension:25206}  HOME EXERCISE PROGRAM: ***  ASSESSMENT:  CLINICAL IMPRESSION: Patient is a *** y.o. *** who was seen today for physical therapy evaluation and treatment for ***.   OBJECTIVE IMPAIRMENTS: {opptimpairments:25111}.   ACTIVITY LIMITATIONS:  {activitylimitations:27494}  PARTICIPATION LIMITATIONS: {participationrestrictions:25113}  PERSONAL FACTORS: {Personal factors:25162} are also affecting patient's functional outcome.   REHAB POTENTIAL: {rehabpotential:25112}  CLINICAL DECISION MAKING: {clinical decision making:25114}  EVALUATION COMPLEXITY: {Evaluation complexity:25115}   GOALS: Goals reviewed with patient? {yes/no:20286}  SHORT TERM GOALS: Target date: ***  *** Baseline: Goal status: INITIAL  2.  *** Baseline:  Goal status: INITIAL  3.  *** Baseline:  Goal status: INITIAL  4.  *** Baseline:  Goal status: INITIAL  5.  *** Baseline:  Goal status: INITIAL  6.  *** Baseline:  Goal status: INITIAL  LONG TERM GOALS: Target date: ***  *** Baseline:  Goal status: INITIAL  2.  *** Baseline:  Goal status: INITIAL  3.  *** Baseline:  Goal status: INITIAL  4.  *** Baseline:  Goal status: INITIAL  5.  *** Baseline:  Goal status: INITIAL  6.  *** Baseline:  Goal status: INITIAL  PLAN:  PT FREQUENCY: {rehab frequency:25116}  PT DURATION: {rehab duration:25117}  PLANNED INTERVENTIONS: {rehab planned interventions:25118::97110-Therapeutic exercises,97530- Therapeutic [phone]- Neuromuscular re-education,97535- Self Rjmz,02859- Manual therapy,Patient/Family education}  PLAN FOR NEXT SESSION: ***   Camden Knotek, PT 04/06/2024, 10:09 AM  "

## 2024-04-07 ENCOUNTER — Encounter: Attending: Obstetrics | Admitting: Physical Therapy

## 2024-04-07 ENCOUNTER — Telehealth: Payer: Self-pay | Admitting: Physical Therapy

## 2024-04-07 ENCOUNTER — Ambulatory Visit: Admitting: Podiatry

## 2024-04-07 NOTE — Telephone Encounter (Signed)
 Spoke to patient on the phone. [facility] and will not make her present PT appt. She feels she has too many doctors appointments at this time and would like to do therapy in the future. Rest of appointments are canceled.  Channing Pereyra, PT @1 /20/26@ 8:59 AM

## 2024-04-07 NOTE — Telephone Encounter (Signed)
 Spoke with pt husband who is on the [facility] informed him that EEG is normal

## 2024-04-07 NOTE — Telephone Encounter (Signed)
-----   Message from Darice Shivers, MD sent at 03/27/2024  4:12 PM EST ----- Pls let her know the EEG is normal, let us  know if any changes in symptoms.

## 2024-04-08 ENCOUNTER — Institutional Professional Consult (permissible substitution) (INDEPENDENT_AMBULATORY_CARE_PROVIDER_SITE_OTHER)

## 2024-04-08 ENCOUNTER — Ambulatory Visit: Admitting: Podiatry

## 2024-04-08 ENCOUNTER — Encounter: Payer: Self-pay | Admitting: Podiatry

## 2024-04-08 DIAGNOSIS — L600 Ingrowing nail: Secondary | ICD-10-CM

## 2024-04-08 NOTE — Progress Notes (Signed)
 Patient presents follow-up nail surgery toe.  No complaints.  Physical exam:  Dermatologic: Nail surgery site for matrixectomy healing well with no signs of infection.  Diagnosis: 1.  Status post matrixectomy toe.  Healing well  Plan: - POV status post nail surgery , if patient has any problems she can call for appointment otherwise we can see her as needed  - Return as needed

## 2024-04-10 ENCOUNTER — Other Ambulatory Visit: Payer: Self-pay | Admitting: Family

## 2024-04-10 DIAGNOSIS — R079 Chest pain, unspecified: Secondary | ICD-10-CM

## 2024-04-14 ENCOUNTER — Encounter: Admitting: Physical Therapy

## 2024-04-14 ENCOUNTER — Encounter: Payer: Self-pay | Admitting: Student

## 2024-04-14 DIAGNOSIS — R7303 Prediabetes: Secondary | ICD-10-CM | POA: Insufficient documentation

## 2024-04-14 DIAGNOSIS — N3281 Overactive bladder: Secondary | ICD-10-CM | POA: Insufficient documentation

## 2024-04-14 DIAGNOSIS — Z0000 Encounter for general adult medical examination without abnormal findings: Secondary | ICD-10-CM | POA: Insufficient documentation

## 2024-04-14 DIAGNOSIS — R7989 Other specified abnormal findings of blood chemistry: Secondary | ICD-10-CM | POA: Insufficient documentation

## 2024-04-14 NOTE — Assessment & Plan Note (Signed)
 Following with UroGYN.

## 2024-04-14 NOTE — Assessment & Plan Note (Addendum)
 Managed with Keppra . Follows with [facility], Dr. Georjean. No recent activity.

## 2024-04-14 NOTE — Assessment & Plan Note (Signed)
 ASCVD risk score- 21%. Advise start atorvastatin  20 mg daily. Pt declines at this time. Encourage heart healthy diet such as MIND or DASH diet, increase exercise, avoid trans fats, simple carbohydrates and processed foods, consider a krill or fish or flaxseed oil cap daily.

## 2024-04-14 NOTE — Assessment & Plan Note (Signed)
 Counseled patient on the health risks. Encouraged use of support resources like Quitline (1-800-QUIT-NOW), apps, or counseling for additional accountability and motivation.Pt declines quitting at this time.  Followed by Pulmonology,  they have ordered LDCT for lung cancer screening. Patient was encouraged to complete this study.

## 2024-04-14 NOTE — Assessment & Plan Note (Signed)
 Last hgba1c acceptable, minimize simple carbs. Increase exercise as tolerated.

## 2024-04-14 NOTE — Assessment & Plan Note (Signed)
 Well controlled, no changes to meds. Encouraged heart healthy diet such as the DASH diet and exercise as tolerated.

## 2024-04-14 NOTE — Assessment & Plan Note (Signed)
 hgba1c acceptable, minimize simple carbs. Increase exercise as tolerated. hgba1c acceptable, minimize simple carbs. Increase exercise as tolerated.

## 2024-04-14 NOTE — Assessment & Plan Note (Signed)
 Supplement and Monitor

## 2024-04-14 NOTE — Assessment & Plan Note (Signed)
 Patient encouraged to maintain heart healthy diet, regular exercise, adequate sleep. Consider daily probiotics. Take medications as prescribed

## 2024-04-14 NOTE — Progress Notes (Unsigned)
 "  Subjective:     Patient ID: Ruth Bell, female    DOB: Jul 22, 1960, 64 y.o.   MRN: [id number]  No chief complaint on file.   HPI  Discussed the use of AI scribe software for clinical note transcription with the patient, who gave verbal consent to proceed.  History of Present Illness        Ruth Bell is a 64 yo female presents for CPE.  PMHX- HTN, HLD, COPD, Anxiety and depression, Migraines, functional seizures (psychogenic non-epileptic events), chronic cough  Follows with: Neurology, Pulmonology, UroGYN  HTN-  amlodipine  5 mg, TOPROL  100 mg BID  COPD- Breztri  and albuterol  prn  Seizure disorder- follows with Neurology, Dr. Georjean Keppra  500 mg BID   HCM DEXA- Due Mgm: Last 02/08/2020, due LDCT: Due **** pumonology has ordered    Health Maintenance Due  Topic Date Due   Medicare Annual Wellness (AWV)  Never done   Zoster Vaccines- Shingrix  (1 of 2) Never done   COVID-19 Vaccine (3 - Moderna risk series) 04/26/2020   Mammogram  02/07/2022   Bone Density Scan  Never done   Lung Cancer Screening  11/12/2023    Past Medical History:  Diagnosis Date   Anemia    Anxiety    Arm skin lesion, left 09/05/2014   Atypical chest pain 2018   Constipation 12/27/2013   Depression    Dysphagia 04/15/2016   Eczema 08/22/2015   GERD (gastroesophageal reflux disease)    Hematuria 03/04/2016   History of shingles 01/22/2016   Hyperglycemia 01/15/2015   Hyperlipidemia, mixed 08/22/2015   Hypertension    Menometrorrhagia 2006   Migraine, unspecified, without mention of intractable migraine without mention of status migrainosus    Preventative health care 11/04/2016   Pseudoseizures    Seizures (HCC)    history of pseudoseizure disorder, last last seizure 3 wks, keppra  inc in dosage   Tubular adenoma of colon 05/2011   Uterine leiomyoma 2006    Past Surgical History:  Procedure Laterality Date   APPENDECTOMY     BILATERAL SALPINGOOPHORECTOMY  04/06/2004    CARDIAC EVENT MONITOR  02/2016   Mostly sinus bradycardia and sinus rhythm.  Rare PVCs and PACs.  No arrhythmias.  Symptoms of chest pain and fluttering as well as passed out spell noted with normal sinus rhythm.   FOOT SURGERY Left 03/20/2024   INCONTINENCE SURGERY     MASS EXCISION Left 06/02/2015   Procedure: EXCISION LEFT ARM MASS;  Surgeon: Deward Null III, MD;  Location: [facility];  Service: General;  Laterality: Left;   NM MYOVIEW  LTD  04/2016   Reached heart rate of 127 bpm with Lexiscan .  EF 60 to 65%.  LOW RISK.  No ischemia or infarction.   ROTATOR CUFF REPAIR     TOTAL LAPAROSCOPIC HYSTERECTOMY WITH BILATERAL SALPINGO OOPHORECTOMY  04/06/2004   TRANSTHORACIC ECHOCARDIOGRAM  01/2016   F 55-6%.  No R WMA.  GR 1 DD.  Normal valves.    Family History  Problem Relation Age of Onset   Heart disease Mother    Heart attack Mother    Cancer Father        colon   Hypertension Father    Brain cancer Sister    Hypertension Sister    Cancer Sister        unsure of type   Diabetes Sister        type 2   Heart attack Sister    Cancer  Brother        unknown type   Hypertension Brother    Colon cancer Neg Hx    Esophageal cancer Neg Hx    Pancreatic cancer Neg Hx    Stomach cancer Neg Hx    Colon polyps Neg Hx    Bladder Cancer Neg Hx    Renal cancer Neg Hx     Social History   Socioeconomic History   Marital status: Married    Spouse name: Elsie   Number of children: 2   Years of education: Not on file   Highest education level: Not on file  Occupational History   Occupation: CMA-retired  Tobacco Use   Smoking status: Every Day    Current packs/day: 0.50    Average packs/day: 0.5 packs/day for 40.5 years (20.3 ttl pk-yrs)    Types: Cigarettes    Start date: 12/18/2015   Smokeless tobacco: Never   Tobacco comments:    Started smoking at age 75. 3-4 cigarettes daily 02/25/2024  Vaping Use   Vaping status: Never Used  Substance and Sexual Activity    Alcohol use: Yes    Comment: wine occassioanlly    Drug use: No   Sexual activity: Not on file    Comment: lives with husband, no dietary restrictions.   Other Topics Concern   Not on file  Social History Narrative   Lives with her husband and their son.   Daughter lives in [place], [place].   Right handed   Social Drivers of Health   Tobacco Use: High Risk (04/08/2024)   Patient History    Smoking Tobacco Use: Every Day    Smokeless Tobacco Use: Never    Passive Exposure: Not on file  Financial Resource Strain: Not on file  Food Insecurity: No Food Insecurity (02/24/2024)   Epic    Worried About Programme Researcher, Broadcasting/film/video in the Last Year: Never true    Ran Out of Food in the Last Year: Never true  Transportation Needs: No Transportation Needs (02/24/2024)   Epic    Lack of Transportation (Medical): No    Lack of Transportation (Non-Medical): No  Physical Activity: Not on file  Stress: Not on file  Social Connections: Moderately Integrated (01/07/2024)   Social Connection and Isolation Panel    Frequency of Communication with Friends and Family: More than three times a week    Frequency of Social Gatherings with Friends and Family: More than three times a week    Attends Religious Services: More than 4 times per year    Active Member of [employer] or Organizations: No    Attends Banker Meetings: Never    Marital Status: Married  Catering Manager Violence: Not At Risk (02/24/2024)   Epic    Fear of Current or Ex-Partner: No    Emotionally Abused: No    Physically Abused: No    Sexually Abused: No  Depression (PHQ2-9): High Risk (02/24/2024)   Depression (PHQ2-9)    PHQ-2 Score: 19  Alcohol Screen: Not on file  Housing: Low Risk (02/24/2024)   Epic    Unable to Pay for Housing in the Last Year: No    Number of Times Moved in the Last Year: 0    Homeless in the Last Year: No  Utilities: Not At Risk (02/24/2024)   Epic    Threatened with loss of utilities: No  Health  Literacy: Not on file    Outpatient Medications Prior to Visit  Medication Sig Dispense  Refill   acetaminophen  (TYLENOL ) 500 MG tablet Take 1,000 mg by mouth every 6 (six) hours as needed for mild pain (pain score 1-3).     albuterol  (VENTOLIN  HFA) 108 (90 Base) MCG/ACT inhaler Inhale 1-2 puffs into the lungs every 6 (six) hours as needed for wheezing or shortness of breath. 6.7 g 6   amLODipine  (NORVASC ) 5 MG tablet Take 1 tablet (5 mg total) by mouth daily. 90 tablet 0   ascorbic acid (VITAMIN C) 100 MG tablet Take 1 tablet by mouth daily. (Patient taking differently: Take 1,000 mg by mouth every other day.)     budesonide -glycopyrrolate -formoterol  (BREZTRI  AEROSPHERE) 160-9-4.8 MCG/ACT AERO inhaler Inhale 2 puffs into the lungs in the morning and at bedtime.     cyclobenzaprine  (FLEXERIL ) 5 MG tablet Take 1 tablet (5 mg total) by mouth every 8 (eight) hours as needed for muscle spasms. 30 tablet 11   desloratadine  (CLARINEX ) 5 MG tablet Take 1 tablet (5 mg total) by mouth daily. 90 tablet 0   estradiol  (ESTRACE ) 0.01 % CREA vaginal cream Place 0.5g nightly for two weeks then twice a week after 42.5 g 3   famotidine  (PEPCID ) 20 MG tablet TAKE 1 TABLET BY MOUTH TWICE A DAY 180 tablet 1   fluticasone  (FLONASE ) 50 MCG/ACT nasal spray Place 2 sprays into both nostrils daily. 16 g 6   guaiFENesin  (MUCINEX ) 600 MG 12 hr tablet Take 1 tablet (600 mg total) by mouth 2 (two) times daily. 60 tablet 0   guaiFENesin -codeine  100-10 MG/5ML syrup Take 5 mLs by mouth every 6 (six) hours as needed for cough. 120 mL 0   HYDROcodone  bit-homatropine (HYDROMET) 5-1.5 MG/5ML syrup Take 5 mLs by mouth every 6 (six) hours as needed for cough. 180 mL 0   hydrOXYzine  (ATARAX ) 10 MG tablet Take 1 tablet (10 mg total) by mouth every 8 (eight) hours as needed for anxiety. 30 tablet 0   hyoscyamine  (ANASPAZ ) 0.125 MG TBDP disintergrating tablet Place 1 tablet (0.125 mg total) under the tongue every 4 (four) hours as needed.  30 tablet 1   levETIRAcetam  (KEPPRA ) 500 MG tablet Take 1 tablet (500 mg total) by mouth 2 (two) times daily. 180 tablet 3   meloxicam  (MOBIC ) 7.5 MG tablet TAKE 1 TABLET BY MOUTH EVERY DAY 30 tablet 0   methylPREDNISolone  (MEDROL ) 4 MG tablet 5 tabs po x 3 day then 4 tabs po x 3 day then 3 tabs po x 3 day then 2 tabs po x 3 day then 1 tab po x 3 day and stop 45 tablet 0   metoprolol  succinate (TOPROL -XL) 100 MG 24 hr tablet Take 1 tablet (100 mg total) by mouth 2 (two) times daily. Take with or immediately following a meal. 180 tablet 0   ondansetron  (ZOFRAN ) 4 MG tablet Take 1 tablet (4 mg total) by mouth every 8 (eight) hours as needed for nausea or vomiting. 30 tablet 0   OVER THE COUNTER MEDICATION MediKisk Lung Cleansng Spray - spray into mouth as needed. Contains - eucalyptus, peppermint, , licorice root, honeysuckle and calendula.     trospium  (SANCTURA ) 20 MG tablet Take 1 tablet (20 mg total) by mouth 2 (two) times daily. 60 tablet 2   No facility-administered medications prior to visit.    Allergies[1]  ROS     Objective:    Physical Exam   LMP  (LMP Unknown)  Wt Readings from Last 3 Encounters:  03/25/24 113 lb 3.2 oz (51.3 kg)  02/25/24 107  lb (48.5 kg)  02/20/24 107 lb 9.6 oz (48.8 kg)       Assessment & Plan:   Problem List Items Addressed This Visit       Cardiovascular and Mediastinum   Essential hypertension (Chronic)   Well controlled, no changes to meds. Encouraged heart healthy diet such as the DASH diet and exercise as tolerated.           Digestive   GERD (gastroesophageal reflux disease)     Endocrine   Hyperglycemia   hgba1c acceptable, minimize simple carbs. Increase exercise as tolerated. hgba1c acceptable, minimize simple carbs. Increase exercise as tolerated.         Nervous and Auditory   Seizure disorder (HCC) (Chronic)   Managed with Keppra . Follows with [facility], Dr. Georjean. No recent activity.         Genitourinary   OAB  (overactive bladder)   Following with UroGYN.         Other   Annual physical exam - Primary   Patient encouraged to maintain heart healthy diet, regular exercise, adequate sleep. Consider daily probiotics. Take medications as prescribed.        Hyperlipidemia, mixed   ASCVD risk score- 21%.  Rx- start atorvastatin  20 mg daily.  FU 3 months recheck lipid panel and LFTs      Low vitamin D  level   Supplement and Monitor      Pre-diabetes   Last hgba1c acceptable, minimize simple carbs. Increase exercise as tolerated.      Tobacco dependence   Counseled patient on the health risks. Encouraged use of support resources like Quitline (1-800-QUIT-NOW), apps, or counseling for additional accountability and motivation.Pt declines quitting at this time.  Followed by Pulmonology,  they have ordered LDCT for lung cancer screening. Patient was encouraged to complete this study.        I am having Teddie Curd. Deyoung maintain her ascorbic acid, famotidine , hyoscyamine , acetaminophen , ondansetron , fluticasone , guaiFENesin , guaiFENesin -codeine , hydrOXYzine , estradiol , trospium , OVER THE COUNTER MEDICATION, amLODipine , desloratadine , metoprolol  succinate, methylPREDNISolone , HYDROcodone  bit-homatropine, albuterol , Breztri  Aerosphere, levETIRAcetam , cyclobenzaprine , and meloxicam .  No orders of the defined types were placed in this encounter.     [1] No Known Allergies  "

## 2024-04-15 ENCOUNTER — Other Ambulatory Visit (HOSPITAL_BASED_OUTPATIENT_CLINIC_OR_DEPARTMENT_OTHER): Payer: Self-pay

## 2024-04-15 ENCOUNTER — Ambulatory Visit: Admitting: Student

## 2024-04-15 ENCOUNTER — Encounter: Payer: Self-pay | Admitting: Student

## 2024-04-15 ENCOUNTER — Ambulatory Visit (HOSPITAL_COMMUNITY): Payer: Self-pay

## 2024-04-15 VITALS — BP 114/72 | HR 76 | Resp 16 | Ht 62.0 in | Wt 115.6 lb

## 2024-04-15 DIAGNOSIS — R739 Hyperglycemia, unspecified: Secondary | ICD-10-CM

## 2024-04-15 DIAGNOSIS — I10 Essential (primary) hypertension: Secondary | ICD-10-CM | POA: Diagnosis not present

## 2024-04-15 DIAGNOSIS — N3281 Overactive bladder: Secondary | ICD-10-CM

## 2024-04-15 DIAGNOSIS — R7303 Prediabetes: Secondary | ICD-10-CM | POA: Diagnosis not present

## 2024-04-15 DIAGNOSIS — J449 Chronic obstructive pulmonary disease, unspecified: Secondary | ICD-10-CM | POA: Diagnosis not present

## 2024-04-15 DIAGNOSIS — G40909 Epilepsy, unspecified, not intractable, without status epilepticus: Secondary | ICD-10-CM

## 2024-04-15 DIAGNOSIS — E782 Mixed hyperlipidemia: Secondary | ICD-10-CM | POA: Diagnosis not present

## 2024-04-15 DIAGNOSIS — Z780 Asymptomatic menopausal state: Secondary | ICD-10-CM

## 2024-04-15 DIAGNOSIS — Z1231 Encounter for screening mammogram for malignant neoplasm of breast: Secondary | ICD-10-CM | POA: Insufficient documentation

## 2024-04-15 DIAGNOSIS — R7989 Other specified abnormal findings of blood chemistry: Secondary | ICD-10-CM

## 2024-04-15 DIAGNOSIS — F39 Unspecified mood [affective] disorder: Secondary | ICD-10-CM

## 2024-04-15 DIAGNOSIS — F17200 Nicotine dependence, unspecified, uncomplicated: Secondary | ICD-10-CM

## 2024-04-15 DIAGNOSIS — F339 Major depressive disorder, recurrent, unspecified: Secondary | ICD-10-CM | POA: Diagnosis not present

## 2024-04-15 DIAGNOSIS — Z0000 Encounter for general adult medical examination without abnormal findings: Secondary | ICD-10-CM

## 2024-04-15 DIAGNOSIS — K219 Gastro-esophageal reflux disease without esophagitis: Secondary | ICD-10-CM

## 2024-04-15 MED ORDER — SHINGRIX 50 MCG/0.5ML IM SUSR
0.5000 mL | Freq: Once | INTRAMUSCULAR | 0 refills | Status: AC
  Filled 2024-04-15: qty 0.5, 1d supply, fill #0

## 2024-04-15 NOTE — Assessment & Plan Note (Signed)
 HCM DEXA- Due, referral placed Mgm: Last 02/08/2020, due. Referral placed LDCT: Due, Following with Pulmonology.LDCT  order placed by Pulmonary, advise following up with this scan.

## 2024-04-15 NOTE — Assessment & Plan Note (Addendum)
 PHQ-19, GAD- 15.  Experiencing suicidal thoughts without plans or intent. Patient was counseled that if suicidal thoughts intensify, they develop a plan, or feel unable to stay safe, they should immediately call 911, go to the nearest emergency department, or contact the Suicide and Crisis Lifeline at 57 (call or text) or use the online chat at newsactor.se. Patient verbalized understanding of these instructions and agreed to seek immediate help if safety concerns arise    She has upcoming appointment with behavioral health, encourage follow up.

## 2024-04-15 NOTE — Assessment & Plan Note (Signed)
 She is scheduled to start CBT with Behavioral Health, advise FU with this. IF symptoms persist or worsen will refer to Psychiatry for evaluation and medication management.

## 2024-04-15 NOTE — Assessment & Plan Note (Signed)
 Follows with Pulmonology. Denies recent exacerbation.

## 2024-04-16 ENCOUNTER — Ambulatory Visit: Payer: Self-pay | Admitting: Student

## 2024-04-16 DIAGNOSIS — E559 Vitamin D deficiency, unspecified: Secondary | ICD-10-CM

## 2024-04-16 LAB — VITAMIN D 25 HYDROXY (VIT D DEFICIENCY, FRACTURES): VITD: 13.34 ng/mL — ABNORMAL LOW (ref 30.00–100.00)

## 2024-04-16 MED ORDER — VITAMIN D (ERGOCALCIFEROL) 1.25 MG (50000 UNIT) PO CAPS: 50000.0000 [IU] | ORAL_CAPSULE | ORAL | 3 refills | Status: AC

## 2024-04-16 MED ORDER — VITAMIN D3 25 MCG (1000 UT) PO CAPS: 1000.0000 [IU] | ORAL_CAPSULE | Freq: Every day | ORAL | Status: AC

## 2024-04-21 ENCOUNTER — Encounter: Admitting: Physical Therapy

## 2024-04-22 ENCOUNTER — Other Ambulatory Visit: Payer: Self-pay | Admitting: Family Medicine

## 2024-04-22 ENCOUNTER — Other Ambulatory Visit

## 2024-04-22 NOTE — Patient Outreach (Signed)
 Complex Care Management   Visit Note  04/22/2024  Name:  Ruth Bell MRN: [id number] DOB: 1961/01/07  Situation: Referral received for Complex Care Management related to COPD, Mental/Behavioral Health diagnosis anxiety and depression, and migraines I obtained verbal consent from Patient.  Visit completed with Patient  on the phone  Background:   Past Medical History:  Diagnosis Date   Anemia    Anxiety    Arm skin lesion, left 09/05/2014   Atypical chest pain 2018   Constipation 12/27/2013   Depression    Dysphagia 04/15/2016   Eczema 08/22/2015   GERD (gastroesophageal reflux disease)    Hematuria 03/04/2016   History of shingles 01/22/2016   Hyperglycemia 01/15/2015   Hyperlipidemia, mixed 08/22/2015   Hypertension    Menometrorrhagia 2006   Migraine, unspecified, without mention of intractable migraine without mention of status migrainosus    Preventative health care 11/04/2016   Pseudoseizures    Seizures (HCC)    history of pseudoseizure disorder, last last seizure 3 wks, keppra  inc in dosage   Tubular adenoma of colon 05/2011   Uterine leiomyoma 2006    Assessment: Patient Reported Symptoms:  Cognitive Cognitive Status: Able to follow simple commands, Alert and oriented to person, place, and time, Normal speech and language skills Cognitive/Intellectual Conditions Management [RPT]: None reported or documented in medical history or problem list   Health Maintenance Behaviors: Annual physical exam, Stress management Health Facilitated by: Prayer/meditation, Rest, Stress management  Neurological Neurological Review of Symptoms: Headaches Oher Neurological Symptoms/Conditions [RPT]: Patient reports headache at time of assessment. She reports headaches every other day Neurological Management Strategies: Coping strategies, Medication therapy, Routine screening Neurological Comment: Note per chart review patient has completed EEG. She reports she has been taking Flexeril   and steroid as ordered  HEENT HEENT Symptoms Reported: Other: HEENT Management Strategies: Coping strategies, Routine screening HEENT Comment: Patient reports she still has not gotten in touch with insurance agent to verify dental coverage into 2026. She reports her husband has all this information. Advised of phone number on the back of insurance card for dental providers    Cardiovascular Cardiovascular Symptoms Reported: Chest pain or discomfort, Swelling in legs or feet (Swelling bilateral ankles L>R, going down per patient) Does patient have uncontrolled Hypertension?: No Cardiovascular Management Strategies: Coping strategies, Medication therapy, Routine screening Cardiovascular Comment: Patient reports chest pain this morning, which she relates to feeling stressed. She reports she took a dose of nitroglycerin with relief  Respiratory Respiratory Symptoms Reported: Productive cough, Shortness of breath Additional Respiratory Details: Patient reports breathing is better today than previous visit. She reports a little shortness of breath but not as much. Productive cough only at nighttime when she lays down with brown mucus production. Reminded of upcoming appointment with pulmonology Respiratory Management Strategies: Adequate rest, Breathing exercise, Coping strategies, Medication therapy, Routine screening  Endocrine Endocrine Symptoms Reported: Not assessed    Gastrointestinal Gastrointestinal Symptoms Reported: Other, Change in appetite Other Gastrointestinal Symptoms: Patient reports she still has not received FOBT in the mail. She denies blood in the stool. Message sent to PCP Additional Gastrointestinal Details: Patient reports continued decreased appetite. She continues to try to drink protein shakes daily Gastrointestinal Management Strategies: Coping strategies, Diet modification    Genitourinary Genitourinary Symptoms Reported: No symptoms reported Genitourinary Management  Strategies: Incontinence garment/pad Genitourinary Comment: Note per chart review all pelvic floor therapy has been cancelled due to patient having so many provider appointments. She denies current issues related to incontinence  Integumentary  Integumentary Symptoms Reported: No symptoms reported Skin Management Strategies: Routine screening Skin Comment: Note per chart review patient is scheduled for mammogram 05/11/24  Musculoskeletal Musculoskelatal Symptoms Reviewed: No symptoms reported Additional Musculoskeletal Details: Patient reports strength is increasing Musculoskeletal Management Strategies: Adequate rest, Coping strategies, Routine screening Musculoskeletal Comment: Note per chart review patient is scheduled for DEXA 05/11/24. Patient denies falls since previous CMRN visit Falls in the past year?: No Number of falls in past year: 1 or less Was there an injury with Fall?: No Fall Risk Category Calculator: 0 Patient Fall Risk Level: Low Fall Risk Patient at Risk for Falls Due to: History of fall(s), Other (Comment) (History of seizures) Fall risk Follow up: Falls evaluation completed, Education provided, Falls prevention discussed  Psychosocial Psychosocial Symptoms Reported: Anxiety - if selected complete GAD, Depression - if selected complete PHQ 2-9 Additional Psychological Details: Note per chart review patient missed behavioral health appointment. Provided patient with office phone number and advised to reschedule. Behavioral Management Strategies: Support system Major Change/Loss/Stressor/Fears (CP): Medical condition, self Techniques to Cope with Loss/Stress/Change: Spiritual practice(s), Diversional activities Quality of Family Relationships: helpful, involved, supportive Do you feel physically threatened by others?: No    04/22/2024    PHQ2-9 Depression Screening   Little interest or pleasure in doing things Several days  Feeling down, depressed, or hopeless Nearly every  day  PHQ-2 - Total Score 4  Trouble falling or staying asleep, or sleeping too much Nearly every day  Feeling tired or having little energy More than half the days  Poor appetite or overeating  Nearly every day  Feeling bad about yourself - or that you are a failure or have let yourself or your family down Nearly every day  Trouble concentrating on things, such as reading the newspaper or watching television Nearly every day  Moving or speaking so slowly that other people could have noticed.  Or the opposite - being so fidgety or restless that you have been moving around a lot more than usual More than half the days  Thoughts that you would be better off dead, or hurting yourself in some way Not at all  PHQ2-9 Total Score 20  If you checked off any problems, how difficult have these problems made it for you to do your work, take care of things at home, or get along with other people Not difficult at all  Depression Interventions/Treatment      There were no vitals filed for this visit. Pain Scale: 0-10 Pain Score: 8  Pain Type: Acute pain Pain Location: Head Multiple Pain Sites: Yes  Medications Reviewed Today     Reviewed by Arno Rosaline SQUIBB, RN (Registered Nurse) on 04/22/24 at 1331  Med List Status: <None>   Medication Order Taking? Sig Documenting Provider Last Dose Status Informant  acetaminophen  (TYLENOL ) 500 MG tablet [id number] Yes Take 1,000 mg by mouth every 6 (six) hours as needed for mild pain (pain score 1-3). [provider]  Active Self, Pharmacy Records  albuterol  (VENTOLIN  HFA) 108 [phone] Base) MCG/ACT inhaler [id number]  Inhale 1-2 puffs into the lungs every 6 (six) hours as needed for wheezing or shortness of breath. Adrien Guan, Tamala, MD  Expired 04/08/24 2359   amLODipine  (NORVASC ) 5 MG tablet [id number] Yes Take 1 tablet (5 mg total) by mouth daily. Domenica Harlene LABOR, MD  Active   ascorbic acid (VITAMIN C) 100 MG tablet [id number] Yes Take 1 tablet by  mouth daily.  Patient taking differently: Take 1,000 mg  by mouth every other day.   [provider]  Active Self, Pharmacy Records  budesonide -glycopyrrolate -formoterol  (BREZTRI  AEROSPHERE) 160-9-4.8 MCG/ACT AERO inhaler [id number] Yes Inhale 2 puffs into the lungs in the morning and at bedtime. Adrien Guan, Tamala, MD  Active   Cholecalciferol (VITAMIN D3) 25 MCG (1000 UT) CAPS [id number]  Take 1 capsule (1,000 Units total) by mouth daily. Yacopino, Jessica L, NP  Active   cyclobenzaprine  (FLEXERIL ) 5 MG tablet [id number] Yes Take 1 tablet (5 mg total) by mouth every 8 (eight) hours as needed for muscle spasms. Georjean Darice HERO, MD  Active   desloratadine  (CLARINEX ) 5 MG tablet [id number] Yes Take 1 tablet (5 mg total) by mouth daily. Domenica Harlene LABOR, MD  Active   estradiol  (ESTRACE ) 0.01 % CREA vaginal cream [id number] Yes Place 0.5g nightly for two weeks then twice a week after Guadlupe Lianne DASEN, MD  Active   famotidine  (PEPCID ) 20 MG tablet [id number] Yes TAKE 1 TABLET BY MOUTH TWICE A DAY Soldatova, Liuba, MD  Active Self, Pharmacy Records  fluticasone  (FLONASE ) 50 MCG/ACT nasal spray [id number] Yes Place 2 sprays into both nostrils daily. Rai, Nydia POUR, MD  Active   guaiFENesin  (MUCINEX ) 600 MG 12 hr tablet [id number] Yes Take 1 tablet (600 mg total) by mouth 2 (two) times daily. Rai, Nydia POUR, MD  Active   hydrOXYzine  (ATARAX ) 10 MG tablet [id number] Yes Take 1 tablet (10 mg total) by mouth every 8 (eight) hours as needed for anxiety. Rai, Nydia POUR, MD  Active   hyoscyamine  (ANASPAZ ) 0.125 MG TBDP disintergrating tablet [id number] Yes Place 1 tablet (0.125 mg total) under the tongue every 4 (four) hours as needed. Domenica Harlene LABOR, MD  Active Self, Pharmacy Records  levETIRAcetam  (KEPPRA ) 500 MG tablet [id number] Yes Take 1 tablet (500 mg total) by mouth 2 (two) times daily. Georjean Darice HERO, MD  Active   metoprolol  succinate (TOPROL -XL) 100 MG 24 hr tablet [id number] Yes Take 1 tablet (100 mg  total) by mouth 2 (two) times daily. Take with or immediately following a meal. Domenica Harlene LABOR, MD  Active   ondansetron  (ZOFRAN ) 4 MG tablet [id number] Yes Take 1 tablet (4 mg total) by mouth every 8 (eight) hours as needed for nausea or vomiting. Rai, Nydia POUR, MD  Active   trospium  (SANCTURA ) 20 MG tablet [id number] Yes Take 1 tablet (20 mg total) by mouth 2 (two) times daily. Guadlupe Lianne T, MD  Active   Vitamin D , Ergocalciferol , (DRISDOL ) 1.25 MG (50000 UNIT) CAPS capsule [id number]  Take 1 capsule (50,000 Units total) by mouth every 7 (seven) days. Wheeler Harlene CROME, NP  Active             Recommendation:   Specialty provider follow-up behavioral health (patient to call and reschedule missed visit) Continue Current Plan of Care Patient to call insurance to request the name of 2-3 in-network dental providers  Follow Up Plan:   Telephone follow up appointment date/time:  05/27/24 at 1:30 PM  Rosaline Finlay, RN MSN [place]  [facility] RN Care Manager Direct Dial: [phone]  Fax: [phone]

## 2024-04-22 NOTE — Patient Instructions (Signed)
 Visit Information  Thank you for taking time to visit with me today. Please don't hesitate to contact me if I can be of assistance to you before our next scheduled appointment.  Your next care management appointment is by telephone on 05/27/24 at 1:30 PM  Make sure to reschedule your missed visit with Apolinar Char, Licensed Clinical Mental Health Counselor. Her office can be reached at [phone]  Contact the number on the back of your insurance card to request the name of 2-3 in-network dental providers.  Please call the care guide team at [phone] if you need to cancel, schedule, or reschedule an appointment.   Please call the Suicide and Crisis Lifeline: 988 call 1-800-273-TALK (toll free, 24 hour hotline) if you are experiencing a Mental Health or Behavioral Health Crisis or need someone to talk to.  Rosaline Finlay, RN MSN [place]  [facility] RN Care Manager Direct Dial: [phone]  Fax: [phone]   Following is a copy of your care plan:   Goals Addressed             This Visit's Progress    [facility] RN Care Plan   On track    Problems:  Chronic Disease Management support and education needs related to Anxiety, COPD, and migraines  Goal: Over the next 30 days the Patient will demonstrate a decrease COPD in exacerbations as evidenced by patient report of stable respiratory symptoms, compliance with ordered inhalers demonstrate Improved adherence to prescribed treatment plan for migraines as evidenced by patient report of medication compliance and decrease in migraine occurrence, patient rating pain <8/10 Confirm dental coverage for 2026 and locate dental provider as evidenced by patient report  Interventions:   COPD Interventions: Advised patient to self assesses COPD action plan zone and make appointment with provider if in the yellow zone for 48 hours without improvement Discussed the importance of adequate rest and management of fatigue  with COPD Provided patient with basic written and verbal COPD education on self care/management/and exacerbation prevention   Evaluation of current treatment plan related to migraine and anxiety self-management and patient's adherence to plan as established by provider. Discussed plans with patient for ongoing care management follow up and provided patient with direct contact information for care management team Advised patient to contact insurance agent to ensure dental coverage has been selected for 2026, contact insurance to request the name of 2-3 in-network dental providers Encouraged patient to reschedule missed visit with behavioral health provider. Provided patient with office phone number Message sent to PCP regarding fecal occult test. Patient reports she has not received in the mail. She has not had any recent episodes of blood in the stool  Patient Self-Care Activities:  Attend all scheduled provider appointments Attend church or other social activities Call provider office for new concerns or questions  Perform all self care activities independently  Take medications as prescribed   limit outdoor activity during cold weather do breathing exercises every day eliminate symptom triggers at home follow rescue plan if symptoms flare-up  Plan:  Telephone follow up appointment with care management team member scheduled for:  05/27/24 at 1:30 PM              Patient verbalized understanding of Care plan and visit instructions communicated this visit

## 2024-04-22 NOTE — Progress Notes (Unsigned)
 [facility] Return Visit  SUBJECTIVE  History of Present Illness: Ruth Bell is a 64 y.o. female seen in follow-up for mixed urinary incontinence, pelvic pain, microscopic hematuria, vaginal atrophy, nocturia, and constipation. Plan at last visit was low dose vaginal estrogen, pelvic floor PT, and trial of Trospium .   SUI 1-2x/days with coughing/sneezing, small volume leakage UUI 2-3x/day  with larger volume leakage, similar symptoms 2-3 leaks/night Pad use: 5 adult diapers/day.   Day time voids 4.  Nocturia: 4x/night to void with insomnia with bladder pressure and pain  Past Medical History: Patient  has a past medical history of Anemia, Anxiety, Arm skin lesion, left (09/05/2014), Atypical chest pain (2018), Constipation (12/27/2013), Depression, Dysphagia (04/15/2016), Eczema (08/22/2015), GERD (gastroesophageal reflux disease), Hematuria (03/04/2016), History of shingles (01/22/2016), Hyperglycemia (01/15/2015), Hyperlipidemia, mixed (08/22/2015), Hypertension, Menometrorrhagia (2006), Migraine, unspecified, without mention of intractable migraine without mention of status migrainosus, Preventative health care (11/04/2016), Pseudoseizures, Seizures (HCC), Tubular adenoma of colon (05/2011), and Uterine leiomyoma (2006).   Past Surgical History: She  has a past surgical history that includes Appendectomy; Bilateral salpingoophorectomy (04/06/2004); Total laparoscopic hysterectomy with bilateral salpingo oophorectomy (04/06/2004); Rotator cuff repair; Mass excision (Left, 06/02/2015); NM MYOVIEW  LTD (04/2016); transthoracic echocardiogram (01/2016); CARDIAC EVENT MONITOR (02/2016); Incontinence surgery; and Foot surgery (Left, 03/20/2024).   Medications: She has a current medication list which includes the following prescription(s): acetaminophen , albuterol , amlodipine , ascorbic acid, breztri  aerosphere, vitamin d3, cyclobenzaprine , desloratadine , estradiol , famotidine ,  fluticasone , guaifenesin , hydroxyzine , hyoscyamine , levetiracetam , metoprolol  succinate, ondansetron , trospium , and vitamin d  (ergocalciferol ).   Allergies: Patient has no known allergies.   Social History: Patient  reports that she has been smoking cigarettes. She started smoking about 8 years ago. She has a 20.3 pack-year smoking history. She has never used smokeless tobacco. She reports current alcohol use. She reports that she does not use drugs.     OBJECTIVE     Physical Exam: There were no vitals filed for this visit. Gen: No apparent distress, A&O x 3.  Detailed Urogynecologic Evaluation:  Deferred. Prior exam showed:      No data to display             ASSESSMENT AND PLAN    Ruth Bell is a 64 y.o. with:  No diagnosis found.  There are no diagnoses linked to this encounter.   Lianne ONEIDA Gillis, MD

## 2024-04-23 ENCOUNTER — Encounter: Payer: Self-pay | Admitting: Obstetrics

## 2024-04-23 ENCOUNTER — Ambulatory Visit: Admitting: Obstetrics

## 2024-04-23 ENCOUNTER — Ambulatory Visit: Payer: Self-pay | Admitting: Obstetrics

## 2024-04-23 ENCOUNTER — Other Ambulatory Visit (HOSPITAL_COMMUNITY)
Admission: RE | Admit: 2024-04-23 | Discharge: 2024-04-23 | Disposition: A | Source: Ambulatory Visit | Attending: Obstetrics | Admitting: Obstetrics

## 2024-04-23 VITALS — BP 122/76 | HR 73

## 2024-04-23 DIAGNOSIS — N952 Postmenopausal atrophic vaginitis: Secondary | ICD-10-CM

## 2024-04-23 DIAGNOSIS — R351 Nocturia: Secondary | ICD-10-CM

## 2024-04-23 DIAGNOSIS — R3129 Other microscopic hematuria: Secondary | ICD-10-CM

## 2024-04-23 DIAGNOSIS — N3281 Overactive bladder: Secondary | ICD-10-CM

## 2024-04-23 DIAGNOSIS — F17200 Nicotine dependence, unspecified, uncomplicated: Secondary | ICD-10-CM

## 2024-04-23 DIAGNOSIS — N3946 Mixed incontinence: Secondary | ICD-10-CM

## 2024-04-23 DIAGNOSIS — R300 Dysuria: Secondary | ICD-10-CM

## 2024-04-23 DIAGNOSIS — N393 Stress incontinence (female) (male): Secondary | ICD-10-CM

## 2024-04-23 LAB — URINALYSIS, COMPLETE (UACMP) WITH MICROSCOPIC
Bacteria, UA: NONE SEEN
Bilirubin Urine: NEGATIVE
Glucose, UA: NEGATIVE mg/dL
Ketones, ur: NEGATIVE mg/dL
Leukocytes,Ua: NEGATIVE
Nitrite: NEGATIVE
Protein, ur: NEGATIVE mg/dL
Specific Gravity, Urine: 1.015 (ref 1.005–1.030)
pH: 7 (ref 5.0–8.0)

## 2024-04-23 LAB — POCT URINALYSIS DIP (CLINITEK)
Bilirubin, UA: NEGATIVE
Glucose, UA: NEGATIVE mg/dL
Ketones, POC UA: NEGATIVE mg/dL
Leukocytes, UA: NEGATIVE
Nitrite, UA: NEGATIVE
POC PROTEIN,UA: NEGATIVE
Spec Grav, UA: 1.02
Urobilinogen, UA: 0.2 U/dL
pH, UA: 7

## 2024-04-23 MED ORDER — TROSPIUM CHLORIDE 20 MG PO TABS: 20.0000 mg | ORAL_TABLET | Freq: Two times a day (BID) | ORAL | 2 refills | Status: AC

## 2024-04-23 MED ORDER — NITROFURANTOIN MONOHYD MACRO 100 MG PO CAPS: 100.0000 mg | ORAL_CAPSULE | Freq: Two times a day (BID) | ORAL | 0 refills | Status: AC

## 2024-04-23 NOTE — Assessment & Plan Note (Signed)
-   encouraged to continue tobacco cessation, cutdown to 3 cigarettes/day

## 2024-04-23 NOTE — Patient Instructions (Addendum)
 Please visit the website below to sign up for an account. We will need an email address to send along with your prescription to verify your prescription once you have signed up.  [email or web address]  Trospium  Chloride (2 times a day dosing) Tablet  20mg   60 count  $14.03  We discussed the symptoms of overactive bladder (OAB), which include urinary urgency, urinary frequency, night-time urination, with or without urge incontinence.  We discussed management including behavioral therapy (decreasing bladder irritants by following a bladder diet, urge suppression strategies, timed voids, bladder retraining), physical therapy, medication; and for refractory cases posterior tibial nerve stimulation, sacral neuromodulation, and intravesical botulinum toxin injection.   For anticholinergic medications, we discussed the potential side effects of anticholinergics including dry eyes, dry mouth, constipation, rare risks of cognitive impairment and urinary retention. You were given prescription for Trospium .  It can take a month to start working so give it time, but if you have bothersome side effects call sooner and we can try a different medication.  Call us  if you have trouble filling the prescription or if it's not covered by your insurance.  Resume vaginal estrogen use. Use 1g twice a week  Proceed with pelvic floor PT  We will call you regarding your urine testing and need for kidney imaging and cystoscopy.

## 2024-04-23 NOTE — Assessment & Plan Note (Signed)
-   pending catheterized UA and culture - Rx macrobid  for presumed UTI - encouraged to resume vaginal estrogen to r/o atrophy

## 2024-04-23 NOTE — Assessment & Plan Note (Addendum)
-   We discussed the symptoms of overactive bladder (OAB), which include urinary urgency, urinary frequency, nocturia, with or without urge incontinence.  While we do not know the exact etiology of OAB, several treatment options exist. We discussed management including behavioral therapy (decreasing bladder irritants, urge suppression strategies, timed voids, bladder retraining), physical therapy, medication; for refractory cases posterior tibial nerve stimulation, sacral neuromodulation, and intravesical botulinum toxin injection.  For anticholinergic medications, we discussed the potential side effects of anticholinergics including dry eyes, dry mouth, constipation, cognitive impairment and urinary retention. For Beta-3 agonist medication, we discussed the potential side effect of elevated blood pressure which is more likely to occur in individuals with uncontrolled hypertension. - Rx trospium  to [facility] due to cost, reviewed instructions to signup after optimization of stool consistency - pending to start pelvic floor PT

## 2024-04-23 NOTE — Assessment & Plan Note (Addendum)
-   repeat POCT + heme, pending repeat catheterized UA microscopy and culture - reduced from 1-2x/day down to 1-2x/week - For treatment of stress urinary incontinence,  non-surgical options include expectant management, weight loss, physical therapy, as well as a pessary.  Surgical options include a midurethral sling, Burch urethropexy, and transurethral injection of a bulking agent. - pending to start pelvic floor PT  - encouraged to restart low dose vaginal estrogen

## 2024-04-23 NOTE — Assessment & Plan Note (Signed)
-   avoid fluid intake 3 hours before bedtime - reassess after trospium  at bedtime, Rx resent to [facility] and reviewed instructions to signup for account

## 2024-04-23 NOTE — Assessment & Plan Note (Signed)
-   For symptomatic vaginal atrophy options include lubrication with a water-based lubricant, personal hygiene measures and barrier protection against wetness, and estrogen replacement in the form of vaginal cream, vaginal tablets, or a time-released vaginal ring.   - encouraged to resume low dose vaginal estrogen at 1g twice a week

## 2024-04-23 NOTE — Assessment & Plan Note (Signed)
-   01/22/24 POCT UA + heme, UA microscopy 0-5 RBC/hpf and culture negative - Rx low dose vaginal estrogen, patient discontinued use after using  1g twice a day for 2-3 weeks and completed 1 tube. - patient denies prior workup - reduced tobacco use to 3 cigarettes/day - For management of microscopic hematuria, we discussed the importance of work-up including assessing the upper and lower GU tract with CT urogram and cystoscopy if repeat testing is positive - pending catheterized UA microscopy and culture today.  - Cr 0.73 in 01/10/24

## 2024-04-24 ENCOUNTER — Ambulatory Visit: Payer: Self-pay | Admitting: Obstetrics

## 2024-04-24 ENCOUNTER — Telehealth: Payer: Self-pay | Admitting: Family Medicine

## 2024-04-24 LAB — URINE CULTURE: Culture: NO GROWTH

## 2024-04-24 NOTE — Telephone Encounter (Signed)
 Pt dropped off papers to be filled out by pcp. Please call pt when papers have been filled out and are ready to be picked up and have been faxed. Left in pcps box

## 2024-04-24 NOTE — Telephone Encounter (Signed)
 Called pt to about [employer] forms and no answer and phone disconnected.

## 2024-04-28 ENCOUNTER — Encounter: Admitting: Physical Therapy

## 2024-05-06 ENCOUNTER — Encounter

## 2024-05-06 ENCOUNTER — Ambulatory Visit

## 2024-05-11 ENCOUNTER — Ambulatory Visit (HOSPITAL_BASED_OUTPATIENT_CLINIC_OR_DEPARTMENT_OTHER)

## 2024-05-11 ENCOUNTER — Other Ambulatory Visit (HOSPITAL_BASED_OUTPATIENT_CLINIC_OR_DEPARTMENT_OTHER)

## 2024-05-13 ENCOUNTER — Institutional Professional Consult (permissible substitution) (INDEPENDENT_AMBULATORY_CARE_PROVIDER_SITE_OTHER)

## 2024-05-20 ENCOUNTER — Ambulatory Visit: Admitting: Cardiology

## 2024-05-26 ENCOUNTER — Encounter: Admitting: Physical Therapy

## 2024-05-27 ENCOUNTER — Telehealth

## 2024-07-23 ENCOUNTER — Ambulatory Visit: Admitting: Obstetrics

## 2024-07-29 ENCOUNTER — Ambulatory Visit: Payer: Self-pay | Admitting: Neurology
